# Patient Record
Sex: Female | Born: 1945
Health system: Southern US, Community
[De-identification: ages and names within clinical notes are randomized; demographics above are authoritative.]

## PROBLEM LIST (undated history)

## (undated) DIAGNOSIS — D649 Anemia, unspecified: Secondary | ICD-10-CM

## (undated) DIAGNOSIS — R0602 Shortness of breath: Secondary | ICD-10-CM

## (undated) DIAGNOSIS — K219 Gastro-esophageal reflux disease without esophagitis: Secondary | ICD-10-CM

## (undated) DIAGNOSIS — F329 Major depressive disorder, single episode, unspecified: Secondary | ICD-10-CM

## (undated) DIAGNOSIS — F259 Schizoaffective disorder, unspecified: Secondary | ICD-10-CM

## (undated) DIAGNOSIS — I10 Essential (primary) hypertension: Secondary | ICD-10-CM

## (undated) DIAGNOSIS — J449 Chronic obstructive pulmonary disease, unspecified: Secondary | ICD-10-CM

## (undated) DIAGNOSIS — E785 Hyperlipidemia, unspecified: Secondary | ICD-10-CM

## (undated) DIAGNOSIS — F0390 Unspecified dementia without behavioral disturbance: Secondary | ICD-10-CM

## (undated) DIAGNOSIS — F250 Schizoaffective disorder, bipolar type: Secondary | ICD-10-CM

## (undated) DIAGNOSIS — F32A Depression, unspecified: Secondary | ICD-10-CM

## (undated) HISTORY — DX: Major depressive disorder, single episode, unspecified: F32.9

## (undated) HISTORY — DX: Gastro-esophageal reflux disease without esophagitis: K21.9

## (undated) HISTORY — DX: Hyperlipidemia, unspecified: E78.5

## (undated) HISTORY — DX: Anemia, unspecified: D64.9

## (undated) HISTORY — DX: Depression, unspecified: F32.A

## (undated) HISTORY — DX: Shortness of breath: R06.02

---

## 1973-11-14 HISTORY — PX: VAGINAL HYSTERECTOMY: SUR661

## 1973-11-14 HISTORY — PX: LAPAROSCOPY: SHX197

## 1998-05-14 ENCOUNTER — Inpatient Hospital Stay (HOSPITAL_COMMUNITY): Admission: AD | Admit: 1998-05-14 | Discharge: 1998-05-21 | Payer: Self-pay | Admitting: Specialist

## 2003-09-30 ENCOUNTER — Ambulatory Visit (HOSPITAL_COMMUNITY): Admission: RE | Admit: 2003-09-30 | Discharge: 2003-09-30 | Payer: Self-pay | Admitting: Internal Medicine

## 2004-09-15 ENCOUNTER — Ambulatory Visit: Payer: Self-pay | Admitting: Nurse Practitioner

## 2004-12-14 ENCOUNTER — Ambulatory Visit: Payer: Self-pay | Admitting: Nurse Practitioner

## 2004-12-29 ENCOUNTER — Ambulatory Visit: Payer: Self-pay | Admitting: Nurse Practitioner

## 2005-01-26 ENCOUNTER — Ambulatory Visit: Payer: Self-pay | Admitting: Nurse Practitioner

## 2005-02-24 ENCOUNTER — Ambulatory Visit: Payer: Self-pay | Admitting: Nurse Practitioner

## 2005-03-01 ENCOUNTER — Ambulatory Visit: Payer: Self-pay | Admitting: Nurse Practitioner

## 2005-03-01 ENCOUNTER — Ambulatory Visit (HOSPITAL_COMMUNITY): Admission: RE | Admit: 2005-03-01 | Discharge: 2005-03-01 | Payer: Self-pay | Admitting: Family Medicine

## 2005-03-24 ENCOUNTER — Ambulatory Visit: Payer: Self-pay | Admitting: Nurse Practitioner

## 2005-08-16 ENCOUNTER — Ambulatory Visit: Payer: Self-pay | Admitting: Nurse Practitioner

## 2005-10-05 ENCOUNTER — Ambulatory Visit: Payer: Self-pay | Admitting: Nurse Practitioner

## 2005-12-09 ENCOUNTER — Ambulatory Visit: Payer: Self-pay | Admitting: Family Medicine

## 2006-02-15 ENCOUNTER — Ambulatory Visit (HOSPITAL_COMMUNITY): Admission: RE | Admit: 2006-02-15 | Discharge: 2006-02-15 | Payer: Self-pay | Admitting: Internal Medicine

## 2006-03-14 ENCOUNTER — Encounter: Admission: RE | Admit: 2006-03-14 | Discharge: 2006-03-14 | Payer: Self-pay | Admitting: Family Medicine

## 2006-07-26 ENCOUNTER — Ambulatory Visit: Payer: Self-pay | Admitting: Nurse Practitioner

## 2006-08-16 ENCOUNTER — Ambulatory Visit: Payer: Self-pay | Admitting: Nurse Practitioner

## 2006-12-21 ENCOUNTER — Ambulatory Visit: Payer: Self-pay | Admitting: Nurse Practitioner

## 2007-01-09 ENCOUNTER — Ambulatory Visit: Payer: Self-pay | Admitting: Nurse Practitioner

## 2007-01-09 ENCOUNTER — Other Ambulatory Visit: Admission: RE | Admit: 2007-01-09 | Discharge: 2007-01-09 | Payer: Self-pay | Admitting: Family Medicine

## 2007-01-09 ENCOUNTER — Encounter (INDEPENDENT_AMBULATORY_CARE_PROVIDER_SITE_OTHER): Payer: Self-pay | Admitting: Nurse Practitioner

## 2007-01-09 ENCOUNTER — Encounter (INDEPENDENT_AMBULATORY_CARE_PROVIDER_SITE_OTHER): Payer: Self-pay | Admitting: *Deleted

## 2007-02-19 ENCOUNTER — Ambulatory Visit: Payer: Self-pay | Admitting: Nurse Practitioner

## 2007-03-21 ENCOUNTER — Encounter: Admission: RE | Admit: 2007-03-21 | Discharge: 2007-03-21 | Payer: Self-pay | Admitting: Family Medicine

## 2007-06-04 DIAGNOSIS — K219 Gastro-esophageal reflux disease without esophagitis: Secondary | ICD-10-CM

## 2007-09-18 ENCOUNTER — Ambulatory Visit: Payer: Self-pay | Admitting: Family Medicine

## 2008-01-11 ENCOUNTER — Ambulatory Visit: Payer: Self-pay | Admitting: Family Medicine

## 2008-01-11 ENCOUNTER — Encounter (INDEPENDENT_AMBULATORY_CARE_PROVIDER_SITE_OTHER): Payer: Self-pay | Admitting: Nurse Practitioner

## 2008-01-11 LAB — CONVERTED CEMR LAB

## 2008-03-21 ENCOUNTER — Ambulatory Visit (HOSPITAL_COMMUNITY): Admission: RE | Admit: 2008-03-21 | Discharge: 2008-03-21 | Payer: Self-pay | Admitting: Family Medicine

## 2008-04-01 ENCOUNTER — Encounter: Admission: RE | Admit: 2008-04-01 | Discharge: 2008-04-01 | Payer: Self-pay | Admitting: Family Medicine

## 2008-08-29 ENCOUNTER — Encounter (INDEPENDENT_AMBULATORY_CARE_PROVIDER_SITE_OTHER): Payer: Self-pay | Admitting: Internal Medicine

## 2008-08-29 ENCOUNTER — Ambulatory Visit: Payer: Self-pay | Admitting: Internal Medicine

## 2008-08-29 LAB — CONVERTED CEMR LAB
ALT: 30 units/L (ref 0–35)
AST: 26 units/L (ref 0–37)
Albumin: 4.6 g/dL (ref 3.5–5.2)
Alkaline Phosphatase: 81 units/L (ref 39–117)
BUN: 12 mg/dL (ref 6–23)
Basophils Absolute: 0 10*3/uL (ref 0.0–0.1)
Basophils Relative: 0 % (ref 0–1)
CO2: 22 meq/L (ref 19–32)
Calcium: 9.4 mg/dL (ref 8.4–10.5)
Chloride: 104 meq/L (ref 96–112)
Cholesterol: 189 mg/dL (ref 0–200)
Creatinine, Ser: 0.87 mg/dL (ref 0.40–1.20)
Eosinophils Absolute: 0.1 10*3/uL (ref 0.0–0.7)
Eosinophils Relative: 1 % (ref 0–5)
Glucose, Bld: 105 mg/dL — ABNORMAL HIGH (ref 70–99)
HCT: 40.4 % (ref 36.0–46.0)
HDL: 46 mg/dL (ref >39)
Hemoglobin: 13 g/dL (ref 12.0–15.0)
LDL Cholesterol: 72 mg/dL (ref 0–99)
Lymphocytes Relative: 24 % (ref 12–46)
Lymphs Abs: 1.7 10*3/uL (ref 0.7–4.0)
MCHC: 32.2 g/dL (ref 30.0–36.0)
MCV: 94.4 fL (ref 78.0–100.0)
Monocytes Absolute: 0.6 10*3/uL (ref 0.1–1.0)
Monocytes Relative: 8 % (ref 3–12)
Neutro Abs: 4.8 10*3/uL (ref 1.7–7.7)
Neutrophils Relative %: 67 % (ref 43–77)
Platelets: 257 10*3/uL (ref 150–400)
Potassium: 3.9 meq/L (ref 3.5–5.3)
RBC: 4.28 M/uL (ref 3.87–5.11)
RDW: 15.2 % (ref 11.5–15.5)
Sodium: 141 meq/L (ref 135–145)
TSH: 0.998 microintl units/mL (ref 0.350–4.50)
Total Bilirubin: 0.3 mg/dL (ref 0.3–1.2)
Total CHOL/HDL Ratio: 4.1
Total Protein: 7.1 g/dL (ref 6.0–8.3)
Triglycerides: 354 mg/dL — ABNORMAL HIGH (ref <150)
VLDL: 71 mg/dL — ABNORMAL HIGH (ref 0–40)
WBC: 7.2 10*3/uL (ref 4.0–10.5)

## 2008-10-01 ENCOUNTER — Ambulatory Visit: Payer: Self-pay | Admitting: Internal Medicine

## 2008-12-17 ENCOUNTER — Ambulatory Visit: Payer: Self-pay | Admitting: Internal Medicine

## 2009-01-28 ENCOUNTER — Ambulatory Visit: Payer: Self-pay | Admitting: Family Medicine

## 2009-03-06 ENCOUNTER — Encounter (INDEPENDENT_AMBULATORY_CARE_PROVIDER_SITE_OTHER): Payer: Self-pay | Admitting: *Deleted

## 2009-04-08 ENCOUNTER — Ambulatory Visit: Payer: Self-pay | Admitting: Family Medicine

## 2009-04-08 ENCOUNTER — Encounter (INDEPENDENT_AMBULATORY_CARE_PROVIDER_SITE_OTHER): Payer: Self-pay | Admitting: Internal Medicine

## 2009-04-08 LAB — CONVERTED CEMR LAB
BUN: 16 mg/dL (ref 6–23)
Basophils Absolute: 0 10*3/uL (ref 0.0–0.1)
Basophils Relative: 0 % (ref 0–1)
CO2: 19 meq/L (ref 19–32)
Calcium: 9.6 mg/dL (ref 8.4–10.5)
Chloride: 106 meq/L (ref 96–112)
Cholesterol: 190 mg/dL (ref 0–200)
Creatinine, Ser: 0.85 mg/dL (ref 0.40–1.20)
Eosinophils Absolute: 0.1 10*3/uL (ref 0.0–0.7)
Eosinophils Relative: 1 % (ref 0–5)
Glucose, Bld: 133 mg/dL — ABNORMAL HIGH (ref 70–99)
HCT: 39.4 % (ref 36.0–46.0)
HDL: 51 mg/dL (ref >39)
Hemoglobin: 12.5 g/dL (ref 12.0–15.0)
LDL Cholesterol: 82 mg/dL (ref 0–99)
Lymphocytes Relative: 24 % (ref 12–46)
Lymphs Abs: 1.6 10*3/uL (ref 0.7–4.0)
MCHC: 31.7 g/dL (ref 30.0–36.0)
MCV: 96.1 fL (ref 78.0–100.0)
Monocytes Absolute: 0.6 10*3/uL (ref 0.1–1.0)
Monocytes Relative: 8 % (ref 3–12)
Neutro Abs: 4.5 10*3/uL (ref 1.7–7.7)
Neutrophils Relative %: 66 % (ref 43–77)
Platelets: 258 10*3/uL (ref 150–400)
Potassium: 4 meq/L (ref 3.5–5.3)
RBC: 4.1 M/uL (ref 3.87–5.11)
RDW: 15.3 % (ref 11.5–15.5)
Sodium: 141 meq/L (ref 135–145)
Total CHOL/HDL Ratio: 3.7
Triglycerides: 286 mg/dL — ABNORMAL HIGH (ref <150)
VLDL: 57 mg/dL — ABNORMAL HIGH (ref 0–40)
WBC: 6.8 10*3/uL (ref 4.0–10.5)

## 2009-04-09 ENCOUNTER — Encounter (INDEPENDENT_AMBULATORY_CARE_PROVIDER_SITE_OTHER): Payer: Self-pay | Admitting: Internal Medicine

## 2009-04-09 LAB — CONVERTED CEMR LAB: Hgb A1c MFr Bld: 6.4 % — ABNORMAL HIGH (ref 4.6–6.1)

## 2009-05-07 ENCOUNTER — Ambulatory Visit (HOSPITAL_COMMUNITY): Admission: RE | Admit: 2009-05-07 | Discharge: 2009-05-07 | Payer: Self-pay | Admitting: Internal Medicine

## 2009-05-13 ENCOUNTER — Ambulatory Visit: Payer: Self-pay | Admitting: Family Medicine

## 2009-05-19 ENCOUNTER — Encounter: Admission: RE | Admit: 2009-05-19 | Discharge: 2009-05-19 | Payer: Self-pay | Admitting: Internal Medicine

## 2009-07-02 ENCOUNTER — Ambulatory Visit: Payer: Self-pay | Admitting: Internal Medicine

## 2009-07-02 ENCOUNTER — Encounter (INDEPENDENT_AMBULATORY_CARE_PROVIDER_SITE_OTHER): Payer: Self-pay | Admitting: Internal Medicine

## 2009-07-02 LAB — CONVERTED CEMR LAB
ALT: 32 units/L (ref 0–35)
AST: 24 units/L (ref 0–37)
Albumin: 4.6 g/dL (ref 3.5–5.2)
Alkaline Phosphatase: 46 units/L (ref 39–117)
BUN: 15 mg/dL (ref 6–23)
CO2: 23 meq/L (ref 19–32)
Calcium: 9.7 mg/dL (ref 8.4–10.5)
Chloride: 104 meq/L (ref 96–112)
Cholesterol: 170 mg/dL (ref 0–200)
Creatinine, Ser: 1.01 mg/dL (ref 0.40–1.20)
Glucose, Bld: 103 mg/dL — ABNORMAL HIGH (ref 70–99)
HDL: 55 mg/dL (ref >39)
LDL Cholesterol: 67 mg/dL (ref 0–99)
Potassium: 4.2 meq/L (ref 3.5–5.3)
Sodium: 140 meq/L (ref 135–145)
Total Bilirubin: 0.3 mg/dL (ref 0.3–1.2)
Total CHOL/HDL Ratio: 3.1
Total Protein: 7.1 g/dL (ref 6.0–8.3)
Triglycerides: 240 mg/dL — ABNORMAL HIGH (ref <150)
VLDL: 48 mg/dL — ABNORMAL HIGH (ref 0–40)

## 2009-07-24 ENCOUNTER — Ambulatory Visit: Payer: Self-pay | Admitting: Internal Medicine

## 2009-07-27 ENCOUNTER — Ambulatory Visit: Payer: Self-pay | Admitting: Internal Medicine

## 2009-08-19 ENCOUNTER — Ambulatory Visit: Payer: Self-pay | Admitting: Internal Medicine

## 2009-08-19 ENCOUNTER — Encounter (INDEPENDENT_AMBULATORY_CARE_PROVIDER_SITE_OTHER): Payer: Self-pay | Admitting: Internal Medicine

## 2009-08-19 ENCOUNTER — Other Ambulatory Visit: Admission: RE | Admit: 2009-08-19 | Discharge: 2009-08-19 | Payer: Self-pay | Admitting: Internal Medicine

## 2009-08-19 LAB — CONVERTED CEMR LAB: Microalb, Ur: 0.5 mg/dL (ref 0.00–1.89)

## 2009-08-20 ENCOUNTER — Ambulatory Visit (HOSPITAL_COMMUNITY): Admission: RE | Admit: 2009-08-20 | Discharge: 2009-08-20 | Payer: Self-pay | Admitting: Internal Medicine

## 2009-08-20 ENCOUNTER — Encounter (INDEPENDENT_AMBULATORY_CARE_PROVIDER_SITE_OTHER): Payer: Self-pay | Admitting: Internal Medicine

## 2009-08-25 ENCOUNTER — Ambulatory Visit (HOSPITAL_COMMUNITY): Admission: RE | Admit: 2009-08-25 | Discharge: 2009-08-25 | Payer: Self-pay | Admitting: Internal Medicine

## 2009-09-03 ENCOUNTER — Emergency Department (HOSPITAL_COMMUNITY): Admission: EM | Admit: 2009-09-03 | Discharge: 2009-09-03 | Payer: Self-pay | Admitting: Emergency Medicine

## 2009-11-17 ENCOUNTER — Ambulatory Visit: Payer: Self-pay | Admitting: Internal Medicine

## 2009-12-23 ENCOUNTER — Ambulatory Visit: Payer: Self-pay | Admitting: Internal Medicine

## 2010-02-10 ENCOUNTER — Ambulatory Visit: Payer: Self-pay | Admitting: Internal Medicine

## 2010-02-10 LAB — CONVERTED CEMR LAB
CRP: 0.6 mg/dL — ABNORMAL HIGH (ref <0.6)
Cholesterol: 170 mg/dL (ref 0–200)
HDL: 58 mg/dL (ref >39)
Hgb A1c MFr Bld: 6 % (ref 4.6–6.1)
LDL Cholesterol: 72 mg/dL (ref 0–99)
Total CHOL/HDL Ratio: 2.9
Triglycerides: 198 mg/dL — ABNORMAL HIGH (ref <150)
VLDL: 40 mg/dL (ref 0–40)

## 2010-02-17 ENCOUNTER — Ambulatory Visit: Payer: Self-pay | Admitting: Internal Medicine

## 2010-06-21 ENCOUNTER — Ambulatory Visit (HOSPITAL_COMMUNITY): Admission: RE | Admit: 2010-06-21 | Discharge: 2010-06-21 | Payer: Self-pay | Admitting: Internal Medicine

## 2010-07-07 ENCOUNTER — Ambulatory Visit: Payer: Self-pay | Admitting: Internal Medicine

## 2010-07-07 LAB — CONVERTED CEMR LAB
BUN: 12 mg/dL (ref 6–23)
CO2: 23 meq/L (ref 19–32)
Calcium: 9.6 mg/dL (ref 8.4–10.5)
Chloride: 103 meq/L (ref 96–112)
Creatinine, Ser: 0.83 mg/dL (ref 0.40–1.20)
Glucose, Bld: 113 mg/dL — ABNORMAL HIGH (ref 70–99)
Microalb, Ur: 0.5 mg/dL (ref 0.00–1.89)
Potassium: 4.3 meq/L (ref 3.5–5.3)
Sodium: 143 meq/L (ref 135–145)

## 2011-02-17 LAB — URINALYSIS, ROUTINE W REFLEX MICROSCOPIC
Glucose, UA: 100 mg/dL — AB
Ketones, ur: 15 mg/dL — AB
Nitrite: NEGATIVE
Protein, ur: 100 mg/dL — AB
Specific Gravity, Urine: >1.03 — ABNORMAL HIGH (ref 1.005–1.030)
Urobilinogen, UA: 1 mg/dL (ref 0.0–1.0)
pH: 5 (ref 5.0–8.0)

## 2011-02-17 LAB — URINE MICROSCOPIC-ADD ON

## 2011-04-27 ENCOUNTER — Encounter: Payer: Self-pay | Admitting: Gastroenterology

## 2011-05-12 ENCOUNTER — Ambulatory Visit (AMBULATORY_SURGERY_CENTER): Payer: Medicaid Other | Admitting: *Deleted

## 2011-05-12 VITALS — Ht 63.0 in | Wt 170.0 lb

## 2011-05-12 DIAGNOSIS — Z1211 Encounter for screening for malignant neoplasm of colon: Secondary | ICD-10-CM

## 2011-05-12 MED ORDER — PEG-KCL-NACL-NASULF-NA ASC-C 100 G PO SOLR: ORAL | Status: DC

## 2011-05-13 ENCOUNTER — Encounter: Payer: Self-pay | Admitting: *Deleted

## 2011-05-13 NOTE — Telephone Encounter (Signed)
error 

## 2011-05-13 NOTE — Telephone Encounter (Deleted)
error 

## 2011-05-17 ENCOUNTER — Other Ambulatory Visit (HOSPITAL_COMMUNITY): Payer: Self-pay | Admitting: Family Medicine

## 2011-05-17 DIAGNOSIS — Z1231 Encounter for screening mammogram for malignant neoplasm of breast: Secondary | ICD-10-CM

## 2011-05-20 ENCOUNTER — Telehealth: Payer: Self-pay | Admitting: *Deleted

## 2011-05-20 ENCOUNTER — Encounter: Payer: Self-pay | Admitting: *Deleted

## 2011-05-20 NOTE — Telephone Encounter (Signed)
Message copied by Sarina Ill ANN on Fri May 20, 2011  7:31 AM ------      Message from: Hart Carwin      Created: Thu May 19, 2011  6:24 PM      Regarding: RE: review for need for propofol/Dr. Russella Dar       I have reviewed the limited information there is on this pt and I don't see a strong indication for Propofol.  DB.      ----- Message -----         From: Wyona Almas, RN         Sent: 05/19/2011   9:38 AM           To: Hart Carwin, MD      Subject: review for need for propofol/Dr. Russella Dar                   Ms. Hick's is a diabetic and is on 2 Pych drugs.  I asked Dr. Russella Dar to review her chart re: need for propofol and he said there wasn't enough information in her chart and to get her paper chart and have one of the other doctor's make a decision that he wouldn't be back from vacation until the 9th.  I had [name] check for a paper chart and Ms Connolly doesn't have one.            Also the only Propofol day available was 06/15/11 at 3pm and being a diabetic I felt she needed a morning procedure.  Her procedure is scheduled for 05/26/11 @8 :30am.  Thank you, Wyona Almas

## 2011-05-20 NOTE — Telephone Encounter (Signed)
error 

## 2011-05-20 NOTE — Telephone Encounter (Signed)
Message copied by Sarina Ill ANN on Fri May 20, 2011 10:25 AM ------      Message from: Sarina Ill ANN      Created: Caleen Essex May 20, 2011  7:36 AM      Regarding: FW: Propofol ?                   ----- Message -----         From: Wyona Almas, RN         Sent: 05/12/2011   5:32 PM           To: Meryl Dare, MD,FACG      Subject: Propofol ?                                               Please review Ms. Hick's chart and advise on need for propofol.  She is a diabetic and needs a morning procedure.  She lives in a group home and has to arrange for someone one in charge to come with her.  Thank you. Alvino Chapel

## 2011-05-24 ENCOUNTER — Telehealth: Payer: Self-pay | Admitting: Gastroenterology

## 2011-05-25 ENCOUNTER — Telehealth: Payer: Self-pay | Admitting: *Deleted

## 2011-05-25 NOTE — Telephone Encounter (Signed)
[facility] PREP called to [facility] [phone]

## 2011-05-25 NOTE — Telephone Encounter (Signed)
Previsit nurse called pharmacy. All taken care of.

## 2011-05-26 ENCOUNTER — Encounter: Payer: Self-pay | Admitting: Gastroenterology

## 2011-05-26 ENCOUNTER — Ambulatory Visit (AMBULATORY_SURGERY_CENTER): Payer: Medicare Other | Admitting: Gastroenterology

## 2011-05-26 VITALS — BP 133/71 | HR 94 | Temp 98.7°F | Resp 24 | Ht 64.0 in | Wt 179.0 lb

## 2011-05-26 DIAGNOSIS — Z1211 Encounter for screening for malignant neoplasm of colon: Secondary | ICD-10-CM

## 2011-05-26 DIAGNOSIS — K5730 Diverticulosis of large intestine without perforation or abscess without bleeding: Secondary | ICD-10-CM

## 2011-05-26 LAB — GLUCOSE, CAPILLARY
Glucose-Capillary: 150 mg/dL — ABNORMAL HIGH (ref 70–99)
Glucose-Capillary: 125 mg/dL — ABNORMAL HIGH (ref 70–99)

## 2011-05-26 MED ORDER — SODIUM CHLORIDE 0.9 % IV SOLN: 500.0000 mL | INTRAVENOUS | Status: DC

## 2011-05-26 NOTE — Progress Notes (Signed)
Pt tolerated the colon exam very well. MAW 

## 2011-05-26 NOTE — Patient Instructions (Signed)
Discharge instructions given with verbal understanding. Handouts on diverticulosis and a a high fiber diet given. Resume previous medications.

## 2011-05-27 ENCOUNTER — Telehealth: Payer: Self-pay | Admitting: *Deleted

## 2011-05-27 NOTE — Telephone Encounter (Signed)

## 2011-06-14 ENCOUNTER — Ambulatory Visit: Payer: Medicare Other | Attending: Family Medicine | Admitting: Physical Therapy

## 2011-06-14 DIAGNOSIS — M25559 Pain in unspecified hip: Secondary | ICD-10-CM | POA: Insufficient documentation

## 2011-06-14 DIAGNOSIS — IMO0001 Reserved for inherently not codable concepts without codable children: Secondary | ICD-10-CM | POA: Insufficient documentation

## 2011-06-23 ENCOUNTER — Ambulatory Visit (HOSPITAL_COMMUNITY)
Admission: RE | Admit: 2011-06-23 | Discharge: 2011-06-23 | Disposition: A | Discharge status: Home / Self Care | Payer: Medicaid Other | Source: Ambulatory Visit | Attending: Family Medicine | Admitting: Family Medicine

## 2011-06-23 DIAGNOSIS — Z1231 Encounter for screening mammogram for malignant neoplasm of breast: Secondary | ICD-10-CM | POA: Insufficient documentation

## 2011-06-27 ENCOUNTER — Ambulatory Visit: Payer: Medicare Other | Attending: Family Medicine | Admitting: Physical Therapy

## 2011-06-27 DIAGNOSIS — IMO0001 Reserved for inherently not codable concepts without codable children: Secondary | ICD-10-CM | POA: Insufficient documentation

## 2011-06-27 DIAGNOSIS — M25559 Pain in unspecified hip: Secondary | ICD-10-CM | POA: Insufficient documentation

## 2011-06-30 ENCOUNTER — Ambulatory Visit: Payer: Medicare Other | Admitting: Physical Therapy

## 2011-07-04 ENCOUNTER — Ambulatory Visit: Payer: Medicare Other | Admitting: Physical Therapy

## 2011-07-07 ENCOUNTER — Ambulatory Visit: Payer: Medicare Other | Admitting: Physical Therapy

## 2011-07-14 ENCOUNTER — Ambulatory Visit: Payer: Medicare Other | Admitting: Physical Therapy

## 2011-07-15 ENCOUNTER — Encounter: Payer: Medicare Other | Admitting: Physical Therapy

## 2011-08-28 ENCOUNTER — Emergency Department (HOSPITAL_COMMUNITY)
Admission: EM | Admit: 2011-08-28 | Discharge: 2011-08-28 | Disposition: A | Discharge status: Home / Self Care | Payer: Medicare Other | Attending: Emergency Medicine | Admitting: Emergency Medicine

## 2011-08-28 ENCOUNTER — Emergency Department (HOSPITAL_COMMUNITY): Payer: Medicare Other

## 2011-08-28 DIAGNOSIS — R062 Wheezing: Secondary | ICD-10-CM | POA: Insufficient documentation

## 2011-08-28 DIAGNOSIS — R000 Tachycardia, unspecified: Secondary | ICD-10-CM | POA: Insufficient documentation

## 2011-08-28 DIAGNOSIS — R0602 Shortness of breath: Secondary | ICD-10-CM | POA: Insufficient documentation

## 2011-08-28 DIAGNOSIS — J4489 Other specified chronic obstructive pulmonary disease: Secondary | ICD-10-CM | POA: Insufficient documentation

## 2011-08-28 DIAGNOSIS — J449 Chronic obstructive pulmonary disease, unspecified: Secondary | ICD-10-CM | POA: Insufficient documentation

## 2011-08-28 LAB — DIFFERENTIAL
Basophils Absolute: 0 10*3/uL (ref 0.0–0.1)
Eosinophils Absolute: 0.1 10*3/uL (ref 0.0–0.7)
Eosinophils Relative: 1 % (ref 0–5)
Lymphocytes Relative: 13 % (ref 12–46)
Neutrophils Relative %: 77 % (ref 43–77)

## 2011-08-28 LAB — CBC
Platelets: 288 10*3/uL (ref 150–400)
RDW: 15.5 % (ref 11.5–15.5)
WBC: 9.2 10*3/uL (ref 4.0–10.5)

## 2011-08-28 LAB — POCT I-STAT, CHEM 8
Calcium, Ion: 1.15 mmol/L (ref 1.12–1.32)
Glucose, Bld: 139 mg/dL — ABNORMAL HIGH (ref 70–99)
HCT: 35 % — ABNORMAL LOW (ref 36.0–46.0)
Hemoglobin: 11.9 g/dL — ABNORMAL LOW (ref 12.0–15.0)
Potassium: 3.7 mEq/L (ref 3.5–5.1)

## 2011-08-29 ENCOUNTER — Emergency Department (HOSPITAL_COMMUNITY)
Admission: EM | Admit: 2011-08-29 | Discharge: 2011-08-29 | Disposition: A | Discharge status: Home / Self Care | Payer: Medicare Other | Attending: Emergency Medicine | Admitting: Emergency Medicine

## 2011-08-29 ENCOUNTER — Emergency Department (HOSPITAL_COMMUNITY): Payer: Medicare Other

## 2011-08-29 DIAGNOSIS — R0989 Other specified symptoms and signs involving the circulatory and respiratory systems: Secondary | ICD-10-CM | POA: Insufficient documentation

## 2011-08-29 DIAGNOSIS — R0609 Other forms of dyspnea: Secondary | ICD-10-CM | POA: Insufficient documentation

## 2011-08-29 DIAGNOSIS — R0602 Shortness of breath: Secondary | ICD-10-CM | POA: Insufficient documentation

## 2011-08-29 DIAGNOSIS — R0789 Other chest pain: Secondary | ICD-10-CM | POA: Insufficient documentation

## 2011-08-29 DIAGNOSIS — Z79899 Other long term (current) drug therapy: Secondary | ICD-10-CM | POA: Insufficient documentation

## 2011-08-29 DIAGNOSIS — E119 Type 2 diabetes mellitus without complications: Secondary | ICD-10-CM | POA: Insufficient documentation

## 2011-08-29 DIAGNOSIS — J449 Chronic obstructive pulmonary disease, unspecified: Secondary | ICD-10-CM | POA: Insufficient documentation

## 2011-08-29 DIAGNOSIS — J4489 Other specified chronic obstructive pulmonary disease: Secondary | ICD-10-CM | POA: Insufficient documentation

## 2011-08-29 LAB — POCT I-STAT, CHEM 8
Chloride: 106 mEq/L (ref 96–112)
HCT: 34 % — ABNORMAL LOW (ref 36.0–46.0)
Potassium: 3.3 mEq/L — ABNORMAL LOW (ref 3.5–5.1)

## 2011-09-01 ENCOUNTER — Telehealth: Payer: Self-pay | Admitting: Internal Medicine

## 2011-09-01 MED ORDER — ALBUTEROL SULFATE HFA 108 (90 BASE) MCG/ACT IN AERS: 2.0000 | INHALATION_SPRAY | Freq: Four times a day (QID) | RESPIRATORY_TRACT | Status: DC | PRN

## 2011-09-01 NOTE — Telephone Encounter (Signed)
Pt has appt on 10-25 with MW at 9am for HFU.  Pt stated that she is out of the ventolin, proair and robitussin DM.  She is requesting refill of these meds to last until her ov next year.  Please advise if ok to refill.  thanks

## 2011-09-01 NOTE — Telephone Encounter (Signed)
Given enough proair until ov

## 2011-09-01 NOTE — Telephone Encounter (Signed)
Called and spoke with pt and she is aware of proair that has been sent to her pharmacy for refills. Pt is aware to keep her appt with MW.

## 2011-09-08 ENCOUNTER — Ambulatory Visit (INDEPENDENT_AMBULATORY_CARE_PROVIDER_SITE_OTHER): Payer: Medicare Other | Admitting: Internal Medicine

## 2011-09-08 ENCOUNTER — Encounter: Payer: Self-pay | Admitting: Internal Medicine

## 2011-09-08 VITALS — BP 146/82 | HR 109 | Temp 98.0°F | Ht 63.0 in | Wt 166.8 lb

## 2011-09-08 DIAGNOSIS — K5790 Diverticulosis of intestine, part unspecified, without perforation or abscess without bleeding: Secondary | ICD-10-CM

## 2011-09-08 DIAGNOSIS — J4540 Moderate persistent asthma, uncomplicated: Secondary | ICD-10-CM | POA: Insufficient documentation

## 2011-09-08 DIAGNOSIS — K5730 Diverticulosis of large intestine without perforation or abscess without bleeding: Secondary | ICD-10-CM

## 2011-09-08 DIAGNOSIS — J449 Chronic obstructive pulmonary disease, unspecified: Secondary | ICD-10-CM

## 2011-09-08 DIAGNOSIS — F17200 Nicotine dependence, unspecified, uncomplicated: Secondary | ICD-10-CM

## 2011-09-08 DIAGNOSIS — Z87891 Personal history of nicotine dependence: Secondary | ICD-10-CM | POA: Insufficient documentation

## 2011-09-08 MED ORDER — BUDESONIDE-FORMOTEROL FUMARATE 160-4.5 MCG/ACT IN AERO: INHALATION_SPRAY | RESPIRATORY_TRACT | Status: DC

## 2011-09-08 NOTE — Assessment & Plan Note (Addendum)
Symptoms are markedly disproportionate to objective findings and not clear this is a lung problem but pt does appear to have difficult airway management issues. DDX of  difficult airways managment all start with A and  include Adherence, Ace Inhibitors, Acid Reflux, Active Sinus Disease, Alpha 1 Antitripsin deficiency, Anxiety masquerading as Airways dz,  ABPA,  allergy(esp in young), Aspiration (esp in elderly), Adverse effects of DPI,  Active smokers, plus two Bs  = Bronchiectasis and Beta blocker use..and one C= CHF  Adherence is always the initial "prime suspect" and is a multilayered concern that requires a "trust but verify" approach in every patient - starting with knowing how to use medications, especially inhalers, correctly, keeping up with refills and understanding the fundamental difference between maintenance and prns vs those medications only taken for a very short course and then stopped and not refilled. The proper method of use, as well as anticipated side effects, of this metered-dose inhaler are discussed and demonstrated to the patient. Improved only to 25% despite reported previous response to saba so not clear she really needs it  ? Acid reflux related, note on biphosphonates, low threshold to d/c or change to IV reclast if considered important for bone health  Active smoking discussed separately.  Anxiety dx of exclusion

## 2011-09-08 NOTE — Progress Notes (Signed)
Subjective:     Patient ID: Elizabeth Schwartz, female   DOB: [birth year], 65 y.o.   MRN: [id number]  HPI  18 yowf smoker uses [facility] for [facility] lives in assisted living since 2004 ? Why, she's not sure,  seen in ER for sob and referred to pulmonary clinic 09/08/2011  09/08/2011 Initial pulmonary office eval  Cc sob x sev years indolent onset progressive decline to point where it's difficult to do grocery shopping with first serious exac x one month much more sob s sign cough or dysphagia itching sneezing >to er where got 3 nebs and better at ov c no meds at all x over a week.  Sleeping ok without nocturnal  or early am exacerbation  of respiratory  c/o's or need for noct saba. Also denies any obvious fluctuation of symptoms with weather or environmental changes or other aggravating or alleviating factors except as outlined above   ROS:  At present neg for  any significant sore throat, dysphagia, itching, sneezing,  nasal congestion or excess/ purulent secretions,  fever, chills, sweats, unintended wt loss, pleuritic or exertional cp, hempoptysis, orthopnea pnd or leg swelling.     Past Medical Hx Diabetes Hyperlipidemia Osteoporosis  Psych ? schizoprhenia on phenothiazines       Review of Systems     Objective:   Physical Exam  amb wf unusual affect, resting tremor  wt 166 09/08/2011  HEENT mild turbinate edema.  Oropharynx no thrush or excess pnd or cobblestoning.  No JVD or cervical adenopathy. Mild accessory muscle hypertrophy. Trachea midline, nl thryroid. Chest was hyperinflated by percussion with diminished breath sounds and moderate increased exp time without wheeze. Hoover sign positive at mid inspiration. Regular rate and rhythm without murmur gallop or rub or increase P2 or edema.  Abd: no hsm, nl excursion. Ext warm without cyanosis or clubbing.     08/29/11  No evidence of active pulmonary disease.   Assessment:         Plan:

## 2011-09-08 NOTE — Assessment & Plan Note (Signed)
I took an extended  opportunity with this patient to outline the consequences of continued cigarette use  in airway disorders based on all the data we have from the multiple national lung health studies (perfomed over decades at millions of dollars in cost)  indicating that smoking cessation, not choice of inhalers or physicians, is the most important aspect of care.    I emphasized that although we never turn away smokers from the pulmonary clinic, we do ask that they understand that the recommendations that we make  won't work nearly as well in the presence of continued cigarette exposure.  In fact, we may very well  reach a point where we can't promise to help the patient if he/she can't quit smoking. (We can and will promise to try to help, we just can't promise what we recommend will really work)

## 2011-09-08 NOTE — Patient Instructions (Signed)
Start symbicort 160  Take 2 puffs first thing in am and then another 2 puffs about 12 hours later.    Work on inhaler technique:  relax and gently blow all the way out then take a nice smooth deep breath back in, triggering the inhaler at same time you start breathing in.  Hold for up to 5 seconds if you can.  Rinse and gargle with water when done   If your mouth or throat starts to bother you,   I suggest you time the inhaler to your dental care and after using the inhaler(s) brush teeth and tongue with a baking soda containing toothpaste and when you rinse this out, gargle with it first to see if this helps your mouth and throat.    Please schedule a follow up office visit in 4 weeks, sooner if needed with pfts

## 2011-09-14 ENCOUNTER — Telehealth: Payer: Self-pay | Admitting: Internal Medicine

## 2011-09-14 NOTE — Telephone Encounter (Signed)
Pt aware that sample of Ventolin HFA is at front for pick up.

## 2011-10-11 ENCOUNTER — Encounter: Payer: Self-pay | Admitting: Internal Medicine

## 2011-10-11 ENCOUNTER — Ambulatory Visit (INDEPENDENT_AMBULATORY_CARE_PROVIDER_SITE_OTHER): Payer: Medicare Other | Admitting: Internal Medicine

## 2011-10-11 VITALS — BP 126/78 | HR 104 | Temp 98.1°F | Ht 63.0 in | Wt 168.0 lb

## 2011-10-11 DIAGNOSIS — F17200 Nicotine dependence, unspecified, uncomplicated: Secondary | ICD-10-CM

## 2011-10-11 DIAGNOSIS — J449 Chronic obstructive pulmonary disease, unspecified: Secondary | ICD-10-CM

## 2011-10-11 LAB — PULMONARY FUNCTION TEST

## 2011-10-11 MED ORDER — BUDESONIDE-FORMOTEROL FUMARATE 160-4.5 MCG/ACT IN AERO: INHALATION_SPRAY | RESPIRATORY_TRACT | Status: DC

## 2011-10-11 NOTE — Assessment & Plan Note (Signed)
GOLD 0 with probable asthmatic component and Symptoms   markedly disproportionate to objective findings and not clear this is a lung problem but pt does appear to have difficult airway management issues.   DDX of  difficult airways managment all start with A and  include Adherence, Ace Inhibitors, Acid Reflux, Active Sinus Disease, Alpha 1 Antitripsin deficiency, Anxiety masquerading as Airways dz,  ABPA,  allergy(esp in young), Aspiration (esp in elderly), Adverse effects of DPI,  Active smokers, plus two Bs  = Bronchiectasis and Beta blocker use..and one C= CHF  Active smoking discussed else where  Adherence:    Each maintenance medication was reviewed in detail including most importantly the difference between maintenance and as needed and under what circumstances the prns are to be used.  Please see instructions for details which were reviewed in writing and the patient given a copy.  The proper method of use, as well as anticipated side effects, of this metered-dose inhaler are discussed and demonstrated to the patient. Improved to 50% with coaching  Anxiety usually a dx of exclusion but obviously an issue here

## 2011-10-11 NOTE — Progress Notes (Signed)
PFT done today. 

## 2011-10-11 NOTE — Assessment & Plan Note (Signed)
I reviewed the Flethcher curve with patient that basically indicates  if you quit smoking when your best day FEV1 is still well preserved it is highly unlikely you will progress to severe disease and informed the patient there was no medication on the market that has proven to change the curve or the likelihood of progression.  Therefore stopping smoking and maintaining abstinence is the most important aspect of care, not choice of inhalers or for that matter, doctors.   

## 2011-10-11 NOTE — Progress Notes (Signed)
Subjective:     Patient ID: Elizabeth Schwartz, female   DOB: [birth year], 65 y.o.   MRN: [id number]  HPI  2 yowf smoker uses [facility] lives in assisted living since 2004 ? Why, she's not sure,  seen in ER for sob and referred to pulmonary clinic 09/08/2011  09/08/2011 Initial pulmonary office eval  Cc sob x sev years indolent onset progressive decline to point where it's difficult to do grocery shopping with first serious exac x one month much more sob s sign cough or dysphagia itching sneezing >to er where got 3 nebs and better at ov c no meds at all x over a week. rec Start symbicort 160  Take 2 puffs first thing in am and then another 2 puffs about 12 hours later.  Work on inhaler technique    10/11/2011 f/u ov/ still smoking one or two a day cc breathing better but still  using saba 2-3 daytime only c/o sob flat and mod pace x one half block.  No sign cough now. hfa still very poor.   Sleeping ok without nocturnal  or early am exacerbation  of respiratory  c/o's or need for noct saba. Also denies any obvious fluctuation of symptoms with weather or environmental changes or other aggravating or alleviating factors except as outlined above   ROS  At present neg for  any significant sore throat, dysphagia, itching, sneezing,  nasal congestion or excess/ purulent secretions,  fever, chills, sweats, unintended wt loss, pleuritic or exertional cp, hempoptysis, orthopnea pnd or leg swelling.  Also denies presyncope, palpitations, heartburn, abdominal pain, nausea, vomiting, diarrhea  or change in bowel or urinary habits, dysuria,hematuria,  rash, arthralgias, visual complaints, headache, numbness weakness or ataxia.      Past Medical Hx Diabetes Hyperlipidemia Osteoporosis  Psych ? schizoprhenia on phenothiazines           Objective:  Physical Exam  amb wf unusual affect, resting tremor   wt 166 09/08/2011 > 10/11/2011  168  HEENT mild turbinate edema.   Oropharynx no thrush or excess pnd or cobblestoning.  No JVD or cervical adenopathy. Mild accessory muscle hypertrophy. Trachea midline, nl thryroid. Chest was hyperinflated by percussion with diminished breath sounds and moderate increased exp time without wheeze. Hoover sign positive at mid inspiration. Regular rate and rhythm without murmur gallop or rub or increase P2 or edema.  Abd: no hsm, nl excursion. Ext warm without cyanosis or clubbing.    cxr 08/29/11  No evidence of active pulmonary disease.   Assessment:         Plan:

## 2011-10-11 NOTE — Patient Instructions (Addendum)
I strongly recommend if you need to be treated with fosfamax longterm that you reclast IV yearly instead since this has no adverse effects on the airways.  Stop smoking completely - it's the most important aspect of your care.   Work on inhaler technique:  relax and gently blow all the way out then take a nice smooth deep breath back in, triggering the inhaler at same time you start breathing in.  Hold for up to 5 seconds if you can.  Rinse and gargle with water when done   If your mouth or throat starts to bother you,   I suggest you time the inhaler to your dental care and after using the inhaler(s) brush teeth and tongue with a baking soda containing toothpaste and when you rinse this out, gargle with it first to see if this helps your mouth and throat.     Only use your albuterol as a rescue medication to be used if you can't catch your breath by resting or doing a relaxed purse lip breathing pattern. The less you use it, the better it will work when you need it.    If you are satisfied with your treatment plan let your doctor know and he/she can either refill your medications or you can return here when your prescription runs out.     If in any way you are not 100% satisfied,  please tell us.  If 100% better, tell your friends!

## 2011-11-17 ENCOUNTER — Emergency Department (HOSPITAL_COMMUNITY): Payer: Medicare Other

## 2011-11-17 ENCOUNTER — Encounter (HOSPITAL_COMMUNITY): Payer: Self-pay | Admitting: *Deleted

## 2011-11-17 ENCOUNTER — Other Ambulatory Visit: Payer: Medicare Other

## 2011-11-17 ENCOUNTER — Inpatient Hospital Stay (HOSPITAL_COMMUNITY)
Admission: EM | Admit: 2011-11-17 | Discharge: 2011-11-22 | DRG: 190 | Disposition: A | Discharge status: Nursing Home (Includes ICF) | Payer: Medicare Other | Attending: Family Medicine | Admitting: Family Medicine

## 2011-11-17 DIAGNOSIS — R0602 Shortness of breath: Secondary | ICD-10-CM | POA: Diagnosis not present

## 2011-11-17 DIAGNOSIS — J9819 Other pulmonary collapse: Secondary | ICD-10-CM | POA: Diagnosis not present

## 2011-11-17 DIAGNOSIS — IMO0002 Reserved for concepts with insufficient information to code with codable children: Secondary | ICD-10-CM | POA: Diagnosis not present

## 2011-11-17 DIAGNOSIS — J189 Pneumonia, unspecified organism: Secondary | ICD-10-CM | POA: Diagnosis present

## 2011-11-17 DIAGNOSIS — Z720 Tobacco use: Secondary | ICD-10-CM

## 2011-11-17 DIAGNOSIS — F209 Schizophrenia, unspecified: Secondary | ICD-10-CM | POA: Diagnosis not present

## 2011-11-17 DIAGNOSIS — Z79899 Other long term (current) drug therapy: Secondary | ICD-10-CM | POA: Diagnosis not present

## 2011-11-17 DIAGNOSIS — D649 Anemia, unspecified: Secondary | ICD-10-CM | POA: Diagnosis present

## 2011-11-17 DIAGNOSIS — F17200 Nicotine dependence, unspecified, uncomplicated: Secondary | ICD-10-CM | POA: Diagnosis not present

## 2011-11-17 DIAGNOSIS — Z87891 Personal history of nicotine dependence: Secondary | ICD-10-CM | POA: Diagnosis present

## 2011-11-17 DIAGNOSIS — R5381 Other malaise: Secondary | ICD-10-CM | POA: Diagnosis not present

## 2011-11-17 DIAGNOSIS — R000 Tachycardia, unspecified: Secondary | ICD-10-CM | POA: Diagnosis not present

## 2011-11-17 DIAGNOSIS — M810 Age-related osteoporosis without current pathological fracture: Secondary | ICD-10-CM | POA: Diagnosis present

## 2011-11-17 DIAGNOSIS — J4540 Moderate persistent asthma, uncomplicated: Secondary | ICD-10-CM | POA: Diagnosis present

## 2011-11-17 DIAGNOSIS — J449 Chronic obstructive pulmonary disease, unspecified: Secondary | ICD-10-CM

## 2011-11-17 DIAGNOSIS — J441 Chronic obstructive pulmonary disease with (acute) exacerbation: Secondary | ICD-10-CM | POA: Diagnosis not present

## 2011-11-17 DIAGNOSIS — E876 Hypokalemia: Secondary | ICD-10-CM | POA: Diagnosis not present

## 2011-11-17 DIAGNOSIS — E119 Type 2 diabetes mellitus without complications: Secondary | ICD-10-CM | POA: Diagnosis present

## 2011-11-17 DIAGNOSIS — E785 Hyperlipidemia, unspecified: Secondary | ICD-10-CM | POA: Diagnosis present

## 2011-11-17 DIAGNOSIS — F3289 Other specified depressive episodes: Secondary | ICD-10-CM | POA: Diagnosis present

## 2011-11-17 DIAGNOSIS — F329 Major depressive disorder, single episode, unspecified: Secondary | ICD-10-CM | POA: Diagnosis present

## 2011-11-17 DIAGNOSIS — K219 Gastro-esophageal reflux disease without esophagitis: Secondary | ICD-10-CM | POA: Diagnosis present

## 2011-11-17 DIAGNOSIS — R062 Wheezing: Secondary | ICD-10-CM | POA: Diagnosis not present

## 2011-11-17 DIAGNOSIS — R059 Cough, unspecified: Secondary | ICD-10-CM | POA: Diagnosis not present

## 2011-11-17 DIAGNOSIS — R918 Other nonspecific abnormal finding of lung field: Secondary | ICD-10-CM | POA: Diagnosis not present

## 2011-11-17 DIAGNOSIS — J9600 Acute respiratory failure, unspecified whether with hypoxia or hypercapnia: Secondary | ICD-10-CM | POA: Diagnosis not present

## 2011-11-17 HISTORY — DX: Chronic obstructive pulmonary disease, unspecified: J44.9

## 2011-11-17 LAB — POCT I-STAT 3, ART BLOOD GAS (G3+)
Acid-base deficit: 4 mmol/L — ABNORMAL HIGH (ref 0.0–2.0)
O2 Saturation: 92 %
pCO2 arterial: 38.9 mmHg (ref 35.0–45.0)

## 2011-11-17 LAB — DIFFERENTIAL
Basophils Relative: 0 % (ref 0–1)
Eosinophils Absolute: 0 10*3/uL (ref 0.0–0.7)
Monocytes Absolute: 1.2 10*3/uL — ABNORMAL HIGH (ref 0.1–1.0)
Neutrophils Relative %: 85 % — ABNORMAL HIGH (ref 43–77)

## 2011-11-17 LAB — CBC
Hemoglobin: 10.2 g/dL — ABNORMAL LOW (ref 12.0–15.0)
MCH: 30.8 pg (ref 26.0–34.0)
MCHC: 33.4 g/dL (ref 30.0–36.0)
MCV: 92.7 fL (ref 78.0–100.0)
Platelets: 243 10*3/uL (ref 150–400)
Platelets: 278 10*3/uL (ref 150–400)
RBC: 3.28 MIL/uL — ABNORMAL LOW (ref 3.87–5.11)
RDW: 14.8 % (ref 11.5–15.5)
RDW: 15 % (ref 11.5–15.5)
WBC: 12.4 10*3/uL — ABNORMAL HIGH (ref 4.0–10.5)

## 2011-11-17 LAB — CREATININE, SERUM
GFR calc Af Amer: 73 mL/min — ABNORMAL LOW (ref >90)
GFR calc non Af Amer: 63 mL/min — ABNORMAL LOW (ref >90)

## 2011-11-17 LAB — BASIC METABOLIC PANEL
Calcium: 9.4 mg/dL (ref 8.4–10.5)
GFR calc non Af Amer: 67 mL/min — ABNORMAL LOW (ref >90)
Glucose, Bld: 212 mg/dL — ABNORMAL HIGH (ref 70–99)
Sodium: 132 mEq/L — ABNORMAL LOW (ref 135–145)

## 2011-11-17 LAB — POCT I-STAT TROPONIN I

## 2011-11-17 MED ORDER — DEXTROSE 5 % IV SOLN
1.0000 g | Freq: Once | INTRAVENOUS | Status: AC
  Administered 2011-11-17: 1 g via INTRAVENOUS
  Filled 2011-11-17: qty 10

## 2011-11-17 MED ORDER — IPRATROPIUM BROMIDE 0.02 % IN SOLN
0.5000 mg | Freq: Once | RESPIRATORY_TRACT | Status: AC
  Administered 2011-11-17: 0.5 mg via RESPIRATORY_TRACT
  Filled 2011-11-17: qty 2.5

## 2011-11-17 MED ORDER — SODIUM CHLORIDE 0.9 % IV BOLUS (SEPSIS): 500.0000 mL | Freq: Once | INTRAVENOUS | Status: DC

## 2011-11-17 MED ORDER — ONDANSETRON HCL 4 MG PO TABS: 4.0000 mg | ORAL_TABLET | Freq: Four times a day (QID) | ORAL | Status: DC | PRN

## 2011-11-17 MED ORDER — ALBUTEROL SULFATE HFA 108 (90 BASE) MCG/ACT IN AERS
2.0000 | INHALATION_SPRAY | Freq: Four times a day (QID) | RESPIRATORY_TRACT | Status: DC | PRN
  Filled 2011-11-17: qty 6.7

## 2011-11-17 MED ORDER — SODIUM CHLORIDE 0.9 % IJ SOLN: 3.0000 mL | INTRAMUSCULAR | Status: DC | PRN

## 2011-11-17 MED ORDER — POTASSIUM CHLORIDE 20 MEQ PO PACK
40.0000 meq | PACK | Freq: Once | ORAL | Status: DC
  Filled 2011-11-17 (×2): qty 2

## 2011-11-17 MED ORDER — SODIUM CHLORIDE 0.9 % IJ SOLN
3.0000 mL | Freq: Two times a day (BID) | INTRAMUSCULAR | Status: DC
  Administered 2011-11-18 – 2011-11-21 (×7): 3 mL via INTRAVENOUS

## 2011-11-17 MED ORDER — DONEPEZIL HCL 10 MG PO TABS
10.0000 mg | ORAL_TABLET | Freq: Every day | ORAL | Status: DC
  Administered 2011-11-18 – 2011-11-21 (×5): 10 mg via ORAL
  Filled 2011-11-17 (×6): qty 1

## 2011-11-17 MED ORDER — CALCIUM POLYCARBOPHIL 625 MG PO TABS
625.0000 mg | ORAL_TABLET | Freq: Two times a day (BID) | ORAL | Status: DC
  Administered 2011-11-18: 625 mg via ORAL
  Administered 2011-11-18: 01:00:00 via ORAL
  Administered 2011-11-18 – 2011-11-22 (×8): 625 mg via ORAL
  Filled 2011-11-17 (×12): qty 1

## 2011-11-17 MED ORDER — FIBER 5 GM/15ML PO LIQD: 15.0000 mL | Freq: Two times a day (BID) | ORAL | Status: DC

## 2011-11-17 MED ORDER — THIOTHIXENE 5 MG PO CAPS
5.0000 mg | ORAL_CAPSULE | Freq: Every day | ORAL | Status: DC
  Administered 2011-11-18 – 2011-11-21 (×5): 5 mg via ORAL
  Filled 2011-11-17 (×6): qty 1

## 2011-11-17 MED ORDER — HEPARIN SODIUM (PORCINE) 5000 UNIT/ML IJ SOLN
5000.0000 [IU] | Freq: Three times a day (TID) | INTRAMUSCULAR | Status: DC
  Administered 2011-11-17 – 2011-11-22 (×14): 5000 [IU] via SUBCUTANEOUS
  Filled 2011-11-17 (×17): qty 1

## 2011-11-17 MED ORDER — ALBUTEROL SULFATE (5 MG/ML) 0.5% IN NEBU
5.0000 mg | INHALATION_SOLUTION | Freq: Once | RESPIRATORY_TRACT | Status: AC
  Administered 2011-11-17: 5 mg via RESPIRATORY_TRACT
  Filled 2011-11-17: qty 1

## 2011-11-17 MED ORDER — METFORMIN HCL 500 MG PO TABS
500.0000 mg | ORAL_TABLET | Freq: Two times a day (BID) | ORAL | Status: DC
  Administered 2011-11-17 – 2011-11-18 (×3): 500 mg via ORAL
  Filled 2011-11-17 (×6): qty 1

## 2011-11-17 MED ORDER — ALBUTEROL SULFATE (5 MG/ML) 0.5% IN NEBU
5.0000 mg | INHALATION_SOLUTION | Freq: Once | RESPIRATORY_TRACT | Status: AC
  Administered 2011-11-17: 5 mg via RESPIRATORY_TRACT
  Filled 2011-11-17: qty 0.5

## 2011-11-17 MED ORDER — FENOFIBRATE 160 MG PO TABS
160.0000 mg | ORAL_TABLET | Freq: Every day | ORAL | Status: DC
  Administered 2011-11-18 – 2011-11-22 (×6): 160 mg via ORAL
  Filled 2011-11-17 (×5): qty 1

## 2011-11-17 MED ORDER — ALBUTEROL SULFATE (5 MG/ML) 0.5% IN NEBU
2.5000 mg | INHALATION_SOLUTION | RESPIRATORY_TRACT | Status: DC | PRN
  Administered 2011-11-18 (×2): 2.5 mg via RESPIRATORY_TRACT
  Filled 2011-11-17: qty 0.5

## 2011-11-17 MED ORDER — BUDESONIDE-FORMOTEROL FUMARATE 160-4.5 MCG/ACT IN AERO
2.0000 | INHALATION_SPRAY | Freq: Two times a day (BID) | RESPIRATORY_TRACT | Status: DC
  Administered 2011-11-18 – 2011-11-22 (×9): 2 via RESPIRATORY_TRACT
  Filled 2011-11-17 (×2): qty 6

## 2011-11-17 MED ORDER — SODIUM CHLORIDE 0.9 % IV SOLN: 250.0000 mL | INTRAVENOUS | Status: DC | PRN

## 2011-11-17 MED ORDER — ACETAMINOPHEN 650 MG RE SUPP: 650.0000 mg | Freq: Four times a day (QID) | RECTAL | Status: DC | PRN

## 2011-11-17 MED ORDER — PREDNISONE 50 MG PO TABS
50.0000 mg | ORAL_TABLET | Freq: Every day | ORAL | Status: DC
  Administered 2011-11-18: 50 mg via ORAL
  Filled 2011-11-17 (×2): qty 1

## 2011-11-17 MED ORDER — AZITHROMYCIN 500 MG PO TABS
500.0000 mg | ORAL_TABLET | Freq: Every day | ORAL | Status: AC
  Administered 2011-11-18 – 2011-11-22 (×5): 500 mg via ORAL
  Filled 2011-11-17 (×5): qty 1

## 2011-11-17 MED ORDER — CHOLECALCIFEROL 10 MCG (400 UNIT) PO TABS
600.0000 [IU] | ORAL_TABLET | Freq: Every day | ORAL | Status: DC
  Administered 2011-11-17 – 2011-11-22 (×6): 600 [IU] via ORAL
  Filled 2011-11-17 (×7): qty 2

## 2011-11-17 MED ORDER — ACETAMINOPHEN 325 MG PO TABS: 650.0000 mg | ORAL_TABLET | Freq: Four times a day (QID) | ORAL | Status: DC | PRN

## 2011-11-17 MED ORDER — IOHEXOL 300 MG/ML  SOLN
100.0000 mL | Freq: Once | INTRAMUSCULAR | Status: AC | PRN
  Administered 2011-11-17: 100 mL via INTRAVENOUS

## 2011-11-17 MED ORDER — CALCIUM CARBONATE 1250 (500 CA) MG PO TABS
1250.0000 mg | ORAL_TABLET | Freq: Two times a day (BID) | ORAL | Status: DC
  Administered 2011-11-17 – 2011-11-22 (×11): 1250 mg via ORAL
  Filled 2011-11-17 (×12): qty 1

## 2011-11-17 MED ORDER — CLOZAPINE 100 MG PO TABS
200.0000 mg | ORAL_TABLET | Freq: Every day | ORAL | Status: DC
  Administered 2011-11-18 – 2011-11-21 (×4): 200 mg via ORAL
  Filled 2011-11-17 (×8): qty 2

## 2011-11-17 MED ORDER — POTASSIUM CHLORIDE CRYS ER 20 MEQ PO TBCR
40.0000 meq | EXTENDED_RELEASE_TABLET | Freq: Once | ORAL | Status: AC
  Administered 2011-11-17: 40 meq via ORAL
  Filled 2011-11-17: qty 2

## 2011-11-17 MED ORDER — CLOZAPINE 100 MG PO TABS
100.0000 mg | ORAL_TABLET | Freq: Two times a day (BID) | ORAL | Status: DC
  Administered 2011-11-17 – 2011-11-22 (×11): 100 mg via ORAL
  Filled 2011-11-17 (×12): qty 1

## 2011-11-17 MED ORDER — SIMVASTATIN 40 MG PO TABS
40.0000 mg | ORAL_TABLET | Freq: Every day | ORAL | Status: DC
  Administered 2011-11-17 – 2011-11-22 (×6): 40 mg via ORAL
  Filled 2011-11-17 (×6): qty 1

## 2011-11-17 MED ORDER — ONDANSETRON HCL 4 MG/2ML IJ SOLN: 4.0000 mg | Freq: Four times a day (QID) | INTRAMUSCULAR | Status: DC | PRN

## 2011-11-17 NOTE — H&P (Signed)
Elizabeth Schwartz is an 66 y.o. female.   Chief Complaint: Shortness of Breath HPI: 66 y.o. F smoker presents with shortness of breath.  Patient reports that she was feeling well until approximately midnight when she woke up with an increasing cough. She reports that yesterday she was in her normal state of health with the exception of a mild, dry cough. Around midnight, she started waking up recurrently with a cough that she describes as "wet". Cough has been nonproductive. She has not noticed any rhinorrhea, runny nose, fevers, or chills. She denies any GI symptoms. She has not had any nausea, vomiting, or diarrhea. She's not had any posttussive emesis. She cannot think of any sick contacts. Of note, she does live in a group home for mental health issues, but has not noted any sick contacts around her.  She has a known history of COPD. She uses tobacco but has cut down from one pack per day to 2 cigarettes per day. She has been eating and drinking well, although has had minimal intake today secondary to shortness of breath with coughing. She states she uses her Symbicort 3 times per day and has not needed her albuterol for the past 2-3 weeks. She reports that she had a similar exacerbation approximately 2-3 weeks ago, which did not require hospitalization, at which time she was started on the Symbicort and albuterol. She has not had antibiotics or steroids in the past few weeks that she knows of. She does not have a home oxygen requirement. She is able to ambulate on her own without significant shortness of breath when she is not sick.  She does report generalized weakness which also started around midnight this morning. She reports feeling shaky after having one or 2 albuterol treatments.  Past Medical History  Diagnosis Date  . Hyperlipidemia   . Diabetes mellitus   . Anemia   . SOB (shortness of breath) on exertion   . GERD (gastroesophageal reflux disease)   . Osteoporosis   . Depression   . COPD  (chronic obstructive pulmonary disease)     Past Surgical History  Procedure Date  . Vaginal hysterectomy   . Laparoscopy     ABD pain    History reviewed. No pertinent family history. Social History:  reports that she has been smoking Cigarettes.  She has been smoking about .25 packs per day. She has never used smokeless tobacco. She reports that she does not drink alcohol or use illicit drugs. she lives in a group home for mental health issues (major depression).  Allergies: No Known Allergies  Medications Prior to Admission  Medication Dose Route Frequency Provider Last Rate Last Dose  . 0.9 %  sodium chloride infusion  500 mL Intravenous Continuous Eliezer Bottom., MD,FACG      . albuterol (PROVENTIL) (5 MG/ML) 0.5% nebulizer solution 5 mg  5 mg Nebulization Once [employer], PA   5 mg at 11/17/11 1153  . albuterol (PROVENTIL) (5 MG/ML) 0.5% nebulizer solution 5 mg  5 mg Nebulization Once [employer], PA   5 mg at 11/17/11 1531  . cefTRIAXone (ROCEPHIN) 1 g in dextrose 5 % 50 mL IVPB  1 g Intravenous Once [employer], PA   1 g at 11/17/11 1529  . iohexol (OMNIPAQUE) 300 MG/ML solution 100 mL  100 mL Intravenous Once PRN Medication Radiologist   100 mL at 11/17/11 1450  . ipratropium (ATROVENT) nebulizer solution 0.5 mg  0.5 mg Nebulization Once [employer]  J Hunt, PA   0.5 mg at 11/17/11 1153  . ipratropium (ATROVENT) nebulizer solution 0.5 mg  0.5 mg Nebulization Once Jenness Corner, PA   0.5 mg at 11/17/11 1531  . sodium chloride 0.9 % bolus 500 mL  500 mL Intravenous Once Jenness Corner, PA       Medications Prior to Admission  Medication Sig Dispense Refill  . albuterol (PROVENTIL HFA;VENTOLIN HFA) 108 (90 BASE) MCG/ACT inhaler Inhale 2 puffs into the lungs every 6 (six) hours as needed for wheezing.  1 Inhaler  0  . albuterol (PROVENTIL) (2.5 MG/3ML) 0.083% nebulizer solution Take 2.5 mg by nebulization every 6 (six) hours as needed. Shortness of breath      .  alendronate (FOSAMAX) 35 MG tablet Take 35 mg by mouth every 7 (seven) days. Take with a full glass of water on an empty stomach.       . budesonide-formoterol (SYMBICORT) 160-4.5 MCG/ACT inhaler Take 2 puffs first thing in am and then another 2 puffs about 12 hours later.     1 Inhaler  12  . calcium carbonate (OS-CAL) 600 MG TABS Take 600 mg by mouth 2 (two) times daily with a meal.        . cloZAPine (CLOZARIL) 100 MG tablet Take 100 mg by mouth. 1 in the morning, 1 in the afternoon, and 2 at night      . donepezil (ARICEPT) 10 MG tablet Take 10 mg by mouth at bedtime.        . fenofibrate 160 MG tablet Take 160 mg by mouth daily.        . Fiber 5 GM/15ML LIQD Take 15 mLs by mouth 2 (two) times daily.        . metFORMIN (GLUCOPHAGE) 500 MG tablet Take 500 mg by mouth 2 (two) times daily with a meal.        . simvastatin (ZOCOR) 40 MG tablet Take 40 mg by mouth daily.       Marland Kitchen thiothixene (NAVANE) 5 MG capsule Take 5 mg by mouth at bedtime.        Marland Kitchen VITAMIN D, CHOLECALCIFEROL, PO Take 600 Units by mouth daily.          Results for orders placed during the hospital encounter of 11/17/11 (from the past 48 hour(s))  CBC     Status: Abnormal   Collection Time   11/17/11 10:35 AM      Component Value Range Comment   WBC 13.8 (*) 4.0 - 10.5 (K/uL)    RBC 3.31 (*) 3.87 - 5.11 (MIL/uL)    Hemoglobin 10.2 (*) 12.0 - 15.0 (g/dL)    HCT 16.1 (*) 09.6 - 46.0 (%)    MCV 92.1  78.0 - 100.0 (fL)    MCH 30.8  26.0 - 34.0 (pg)    MCHC 33.4  30.0 - 36.0 (g/dL)    RDW 04.5  40.9 - 81.1 (%)    Platelets 243  150 - 400 (K/uL)   DIFFERENTIAL     Status: Abnormal   Collection Time   11/17/11 10:35 AM      Component Value Range Comment   Neutrophils Relative 85 (*) 43 - 77 (%)    Lymphocytes Relative 6 (*) 12 - 46 (%)    Monocytes Relative 9  3 - 12 (%)    Eosinophils Relative 0  0 - 5 (%)    Basophils Relative 0  0 - 1 (%)    Neutro  Abs 11.8 (*) 1.7 - 7.7 (K/uL)    Lymphs Abs 0.8  0.7 - 4.0 (K/uL)     Monocytes Absolute 1.2 (*) 0.1 - 1.0 (K/uL)    Eosinophils Absolute 0.0  0.0 - 0.7 (K/uL)    Basophils Absolute 0.0  0.0 - 0.1 (K/uL)    Smear Review MORPHOLOGY UNREMARKABLE     BASIC METABOLIC PANEL     Status: Abnormal   Collection Time   11/17/11 10:35 AM      Component Value Range Comment   Sodium 132 (*) 135 - 145 (mEq/L)    Potassium 3.4 (*) 3.5 - 5.1 (mEq/L)    Chloride 98  96 - 112 (mEq/L)    CO2 20  19 - 32 (mEq/L)    Glucose, Bld 212 (*) 70 - 99 (mg/dL)    BUN 19  6 - 23 (mg/dL)    Creatinine, Ser 1.61  0.50 - 1.10 (mg/dL)    Calcium 9.4  8.4 - 10.5 (mg/dL)    GFR calc non Af Amer 67 (*) >90 (mL/min)    GFR calc Af Amer 77 (*) >90 (mL/min)   POCT I-STAT 3, BLOOD GAS (G3+)     Status: Abnormal   Collection Time   11/17/11 10:54 AM      Component Value Range Comment   pH, Arterial 7.341 (*) 7.350 - 7.400     pCO2 arterial 38.9  35.0 - 45.0 (mmHg)    pO2, Arterial 68.0 (*) 80.0 - 100.0 (mmHg)    Bicarbonate 21.1  20.0 - 24.0 (mEq/L)    TCO2 22  0 - 100 (mmol/L)    O2 Saturation 92.0      Acid-base deficit 4.0 (*) 0.0 - 2.0 (mmol/L)    Patient temperature 98.8 F      Collection site RADIAL, ALLEN'S TEST ACCEPTABLE      Drawn by Operator      Sample type ARTERIAL     POCT I-STAT TROPONIN I     Status: Normal   Collection Time   11/17/11 10:58 AM      Component Value Range Comment   Troponin i, poc 0.03  0.00 - 0.08 (ng/mL)    Comment 3             Ct Angio Chest W/cm &/or Wo Cm  11/17/2011  *RADIOLOGY REPORT*  Clinical Data:  Shortness of breath, cough and tachycardia.  CT ANGIOGRAPHY CHEST WITH CONTRAST  Technique:  Multidetector CT imaging of the chest was performed using the standard protocol during bolus administration of intravenous contrast.  Multiplanar CT image reconstructions including MIPs were obtained to evaluate the vascular anatomy.  Contrast: OMNIPAQUE IOHEXOL 300 MG/ML IV SOLN  Comparison:  Chest x-ray earlier today.  Findings:  Pulmonary arteries are  adequately opacified.  There is no evidence of acute pulmonary embolism.  Mild respiratory motion was present during acquisition.  Patchy airspace opacity in the anterior right upper lobe present which may represent acute pneumonia.  No overt edema or pleural effusions.  Trace pericardial fluid.  Heart size is normal.  No nodules, masses or enlarged lymph nodes are identified.  The airway shows normal patency.  The thoracic aorta is of normal caliber.  The bony thorax is unremarkable.  Review of the MIP images confirms the above findings.  IMPRESSION: Anterior right upper lobe infiltrate.  Original Report Authenticated By: Reola Calkins, M.D.   Dg Chest Port 1 View  11/17/2011  *RADIOLOGY REPORT*  Clinical Data: Weakness,  productive cough  PORTABLE CHEST - 1 VIEW  Comparison: 08/29/2011  Findings: Borderline cardiomegaly noted.  No acute infiltrate or convincing pulmonary edema.  Mild basilar atelectasis.  Central mild bronchitic changes.  IMPRESSION: No acute infiltrate or convincing pulmonary edema.  Mild basilar atelectasis.  Central mild bronchitic changes.  Original Report Authenticated By: Natasha Mead, M.D.    Review of Systems  Constitutional: Negative for fever, chills, weight loss and diaphoresis.  HENT: Negative for congestion and sore throat.   Respiratory: Positive for cough, shortness of breath and wheezing. Negative for hemoptysis and sputum production.   Cardiovascular: Negative for chest pain, palpitations, orthopnea and leg swelling.  Gastrointestinal: Negative for heartburn, nausea, vomiting, abdominal pain, diarrhea, constipation and blood in stool.  Genitourinary: Negative for dysuria and urgency.  Musculoskeletal: Negative for myalgias and joint pain.  Skin: Negative for rash.  Neurological: Positive for weakness. Negative for dizziness, speech change, focal weakness and headaches.  Psychiatric/Behavioral: Positive for depression. Negative for suicidal ideas and substance abuse.     Blood pressure 161/61, pulse 111, temperature 98.8 F (37.1 C), temperature source Oral, resp. rate 20, SpO2 93.00%. Physical Exam  Constitutional: She is oriented to person, place, and time. She appears well-developed and well-nourished. No distress.  HENT:  Head: Normocephalic and atraumatic.  Right Ear: External ear normal.  Left Ear: External ear normal.  Mouth/Throat: Oropharynx is clear and moist. No oropharyngeal exudate.  Eyes: Conjunctivae and EOM are normal. Pupils are equal, round, and reactive to light.  Neck: Normal range of motion. Neck supple. No JVD present. No thyromegaly present.  Cardiovascular: Normal rate, regular rhythm and normal heart sounds.  Exam reveals no friction rub.   No murmur heard. Respiratory: No respiratory distress. She has wheezes. She has rales. She exhibits no tenderness.  GI: Soft. Bowel sounds are normal. There is no tenderness.  Musculoskeletal: She exhibits no edema and no tenderness.  Lymphadenopathy:    She has no cervical adenopathy.  Neurological: She is alert and oriented to person, place, and time. No cranial nerve deficit.  Skin: Skin is warm and dry. No rash noted. She is not diaphoretic. No erythema.  Psychiatric: She has a normal mood and affect.     Assessment/Plan 66 year old female presents with shortness of breath  1. Shortness of breath: Patient with known COPD; likely secondary to COPD exacerbation. Cardiac cause less likely given normal troponin and EKG in the ED, PE ruled out with normal CT angiogram, infectious source less likely with patient being afebrile  -Chest x-ray negative but possible infiltrate seen on CT angiogram; patient has been afebrile so pneumonia is less likely -Patient maintaining oxygen saturation on 2 L nasal cannula; ABG acceptable at this time -Continue supplemental oxygen as needed -Continue albuterol nebulizer every 4 hours as needed -Restart home Symbicort in the morning -Patient given  ceftriaxone x1 in the emergency department, consider 5 day course of Azithro or other antibiotic if patient becomes febrile or does not improve overnight for possible HCAP since pt does have elevated WBC; recheck CBC in AM -Will give 5 day burst of prednisone for COPD exacerbation (will likely cause WBC to rise further) -Smoking Cessation Counseling  2. Tobacco abuse: Patient reports smoking only 2 cigarettes per day and currently does not want nicotine patch. -Smoking cessation counseling -Consider adding low dose nicotine replacement if pt desires  3. Diabetes: Patient reports being only on metformin at home. -Place patient on home medication, Cr WNL, will recheck BMET in AM since pt  was given contrast for CT angio -Consider adding sliding scale insulin if needed.  4. Depression/Psych: Continue home meds (clozapin, aricept, navane). Currently denies any suicidal ideation or psychotic features.  5. HLD: Continue home simvastatin and fenofibrate   6. Anemia: Patient with history of anemia. Hemoglobin currently 10.2. -Recheck CBC in morning -Is only mild at this time and unlikely contributing to her shortness of breath  7. Osteoporosis: Continue home calcium and vitamin D  8. FEN/GI: Heart healthy, carb modified diet; SLIV (pt does not appear dehydrated) -Mild hypoK, will treat with po K replacement in the setting of continued albuterol administration -Recheck BMET in AM  9. Ppx: SQ heparin  10. Dispo: Back to group home pending clinical improvement, decreased O2 requirement and improvement in pulmonary exam  [facility] PGY-2 11/17/2011, 3:46 PM

## 2011-11-17 NOTE — ED Provider Notes (Signed)
Medical screening examination/treatment/procedure(s) were performed by non-physician practitioner and as supervising physician I was immediately available for consultation/collaboration.    Celene Kras, MD 11/17/11 2043

## 2011-11-17 NOTE — ED Provider Notes (Signed)
History     CSN: [id number]  Arrival date & time 11/17/11  1028   First MD Initiated Contact with Patient 11/17/11 1030      Chief Complaint  Patient presents with  . Shortness of Breath    (Consider location/radiation/quality/duration/timing/severity/associated sxs/prior treatment) HPI  Patient is followed by Dr. Sherene Sires, from [facility] pulmonology, for tobacco abuse and shortness of breath with questionable asthma who continues to smoke despite being advised numerous times to quit for optimum symptomatic improvement presents to emergency department by EMS with complaint of shortness of breath. Patient states she began to have a dry cough yesterday but denies fevers or chills and woke this morning acutely short of breath. Per EMS patient was tachypneic and tripoding and therefore EMS started on a dual nebulizer treatment of albuterol and Atrovent and gave 125 mg Solu-Medrol in route. Patient was then given an additional albuterol neb treatment as she was brought in to ER by EMS. Patient denies chest pain, fevers or chills. She denies any recent hospitalizations or antibiotic use. Patient states her cough has been productive of thick green mucus. Patient states she is using her daily Symbicort albuterol inhalers.  Past Medical History  Diagnosis Date  . Hyperlipidemia   . Diabetes mellitus   . Anemia   . SOB (shortness of breath) on exertion   . GERD (gastroesophageal reflux disease)   . Osteoporosis   . Depression     Past Surgical History  Procedure Date  . Vaginal hysterectomy   . Laparoscopy     ABD pain    No family history on file.  History  Substance Use Topics  . Smoking status: Current Everyday Smoker -- 0.2 packs/day    Types: Cigarettes  . Smokeless tobacco: Never Used   Comment: 1-2 ciggs per day  . Alcohol Use: No    OB History    Grav Para Term Preterm Abortions TAB SAB Ect Mult Living                  Review of Systems  All other systems reviewed and  are negative.    Allergies  Review of patient's allergies indicates no known allergies.  Home Medications   Current Outpatient Rx  Name Route Sig Dispense Refill  . ALBUTEROL SULFATE HFA 108 (90 BASE) MCG/ACT IN AERS Inhalation Inhale 2 puffs into the lungs every 6 (six) hours as needed for wheezing. 1 Inhaler 0  . ALBUTEROL SULFATE (2.5 MG/3ML) 0.083% IN NEBU Nebulization Take 2.5 mg by nebulization every 6 (six) hours as needed.      . ALENDRONATE SODIUM 35 MG PO TABS Oral Take 35 mg by mouth every 7 (seven) days. Take with a full glass of water on an empty stomach.     . BUDESONIDE-FORMOTEROL FUMARATE 160-4.5 MCG/ACT IN AERO  Take 2 puffs first thing in am and then another 2 puffs about 12 hours later.    1 Inhaler 12  . CALCIUM CARBONATE 600 MG PO TABS Oral Take 600 mg by mouth 2 (two) times daily with a meal.      . CLOZAPINE 100 MG PO TABS Oral Take 100 mg by mouth. 1 in the morning, 1 in the afternoon, and 2 at night    . DONEPEZIL HCL 10 MG PO TABS Oral Take 10 mg by mouth at bedtime.      . FENOFIBRATE 160 MG PO TABS Oral Take 160 mg by mouth daily.      Marland Kitchen FIBER 5 GM/15ML  PO LIQD Oral Take 15 mLs by mouth 2 (two) times daily.      Marland Kitchen MILK OF MAGNESIA PO Oral Take 30 mLs by mouth 3 (three) times daily as needed. constipation     . METFORMIN HCL 500 MG PO TABS Oral Take 500 mg by mouth 2 (two) times daily with a meal.      . OMEPRAZOLE 40 MG PO CPDR Oral Take 40 mg by mouth daily.      Marland Kitchen SIMVASTATIN 40 MG PO TABS Oral Take 40 mg by mouth at bedtime.      . THIOTHIXENE 5 MG PO CAPS Oral Take 5 mg by mouth at bedtime.      Marland Kitchen VITAMIN D (CHOLECALCIFEROL) PO Oral Take 600 Units by mouth daily.        BP 156/58  Pulse 129  Temp(Src) 98.8 F (37.1 C) (Oral)  Resp 22  SpO2 99%  Physical Exam  Nursing note and vitals reviewed. Constitutional: She is oriented to person, place, and time. She appears well-developed and well-nourished. No distress.  HENT:  Head: Normocephalic and  atraumatic.  Eyes: Conjunctivae and EOM are normal. Pupils are equal, round, and reactive to light.  Neck: Normal range of motion. Neck supple.  Cardiovascular: Regular rhythm, normal heart sounds, intact distal pulses and normal pulses.  Tachycardia present.  Exam reveals no gallop and no friction rub.   No murmur heard. Pulmonary/Chest: Tachypnea noted. No respiratory distress. She has wheezes. She has rhonchi in the right middle field and the left middle field. She has no rales. She exhibits no tenderness.  Abdominal: Bowel sounds are normal. She exhibits no distension and no mass. There is no tenderness. There is no rebound and no guarding.  Musculoskeletal: Normal range of motion. She exhibits no edema and no tenderness.  Neurological: She is alert and oriented to person, place, and time.  Skin: Skin is warm and dry. No rash noted. She is not diaphoretic. No erythema.  Psychiatric: She has a normal mood and affect.    ED Course  Procedures (including critical care time)   Date: 11/17/2011  Rate: 129  Rhythm: sinus tachycardia  QRS Axis: normal  Intervals: normal  ST/T Wave abnormalities: nonspecific T wave changes  Conduction Disutrbances:none  Narrative Interpretation:   Old EKG Reviewed: new tachy but no significant findings compared to Jan 10, 2010    Labs Reviewed  CBC - Abnormal; Notable for the following:    WBC 13.8 (*)    RBC 3.31 (*)    Hemoglobin 10.2 (*)    HCT 30.5 (*)    All other components within normal limits  DIFFERENTIAL - Abnormal; Notable for the following:    Neutrophils Relative 85 (*)    Lymphocytes Relative 6 (*)    Neutro Abs 11.8 (*)    Monocytes Absolute 1.2 (*)    All other components within normal limits  BASIC METABOLIC PANEL - Abnormal; Notable for the following:    Sodium 132 (*)    Potassium 3.4 (*)    Glucose, Bld 212 (*)    GFR calc non Af Amer 67 (*)    GFR calc Af Amer 77 (*)    All other components within normal limits  POCT  I-STAT 3, BLOOD GAS (G3+) - Abnormal; Notable for the following:    pH, Arterial 7.341 (*)    pO2, Arterial 68.0 (*)    Acid-base deficit 4.0 (*)    All other components within normal limits  POCT  I-STAT TROPONIN I  I-STAT TROPONIN I  BLOOD GAS, ARTERIAL   Ct Angio Chest W/cm &/or Wo Cm  11/17/2011  *RADIOLOGY REPORT*  Clinical Data:  Shortness of breath, cough and tachycardia.  CT ANGIOGRAPHY CHEST WITH CONTRAST  Technique:  Multidetector CT imaging of the chest was performed using the standard protocol during bolus administration of intravenous contrast.  Multiplanar CT image reconstructions including MIPs were obtained to evaluate the vascular anatomy.  Contrast: OMNIPAQUE IOHEXOL 300 MG/ML IV SOLN  Comparison:  Chest x-ray earlier today.  Findings:  Pulmonary arteries are adequately opacified.  There is no evidence of acute pulmonary embolism.  Mild respiratory motion was present during acquisition.  Patchy airspace opacity in the anterior right upper lobe present which may represent acute pneumonia.  No overt edema or pleural effusions.  Trace pericardial fluid.  Heart size is normal.  No nodules, masses or enlarged lymph nodes are identified.  The airway shows normal patency.  The thoracic aorta is of normal caliber.  The bony thorax is unremarkable.  Review of the MIP images confirms the above findings.  IMPRESSION: Anterior right upper lobe infiltrate.  Original Report Authenticated By: Reola Calkins, M.D.   Dg Chest Port 1 View  11/17/2011  *RADIOLOGY REPORT*  Clinical Data: Weakness, productive cough  PORTABLE CHEST - 1 VIEW  Comparison: 08/29/2011  Findings: Borderline cardiomegaly noted.  No acute infiltrate or convincing pulmonary edema.  Mild basilar atelectasis.  Central mild bronchitic changes.  IMPRESSION: No acute infiltrate or convincing pulmonary edema.  Mild basilar atelectasis.  Central mild bronchitic changes.  Original Report Authenticated By: Natasha Mead, M.D.     1.  COPD exacerbation   2. Tobacco abuse   3. Community acquired pneumonia       MDM  [facility] to admit patient. IV abx given for CAP delayed due to CXR showing no PNA but seen on CT angio. No evidence of PE. VSS but remains tachy.        Jenness Corner, PA 11/17/11 1542  Jenness Corner, [place] 11/17/11 1544

## 2011-11-17 NOTE — ED Notes (Signed)
Patient  C/o sob onset this am, patient has a home health nurse who saw her earlier today  And came back to her house patient appear to be in resp. Distress. Patient was given 2 puffs on her inhaler without relief. Patient was given Albuterol 5 mg/ atrovent .5 , patient was also given 125 mg of solumedrol.

## 2011-11-18 LAB — BASIC METABOLIC PANEL
BUN: 23 mg/dL (ref 6–23)
Chloride: 101 mEq/L (ref 96–112)
GFR calc Af Amer: 60 mL/min — ABNORMAL LOW (ref >90)
GFR calc non Af Amer: 52 mL/min — ABNORMAL LOW (ref >90)
Glucose, Bld: 164 mg/dL — ABNORMAL HIGH (ref 70–99)
Potassium: 4.2 mEq/L (ref 3.5–5.1)
Sodium: 136 mEq/L (ref 135–145)

## 2011-11-18 LAB — CBC
HCT: 31 % — ABNORMAL LOW (ref 36.0–46.0)
Hemoglobin: 10 g/dL — ABNORMAL LOW (ref 12.0–15.0)
MCHC: 32.3 g/dL (ref 30.0–36.0)
RDW: 14.9 % (ref 11.5–15.5)
WBC: 15.5 10*3/uL — ABNORMAL HIGH (ref 4.0–10.5)

## 2011-11-18 MED ORDER — ALBUTEROL SULFATE (5 MG/ML) 0.5% IN NEBU
5.0000 mg | INHALATION_SOLUTION | RESPIRATORY_TRACT | Status: DC | PRN
  Administered 2011-11-18: 5 mg via RESPIRATORY_TRACT
  Filled 2011-11-18: qty 0.5

## 2011-11-18 MED ORDER — METHYLPREDNISOLONE SODIUM SUCC 40 MG IJ SOLR
40.0000 mg | Freq: Four times a day (QID) | INTRAMUSCULAR | Status: DC
  Administered 2011-11-18 – 2011-11-20 (×8): 40 mg via INTRAVENOUS
  Filled 2011-11-18 (×13): qty 1

## 2011-11-18 MED ORDER — DEXTROSE 5 % IV SOLN
1.0000 g | INTRAVENOUS | Status: DC
  Administered 2011-11-18 – 2011-11-20 (×3): 1 g via INTRAVENOUS
  Filled 2011-11-18 (×4): qty 10

## 2011-11-18 MED ORDER — ALBUTEROL SULFATE HFA 108 (90 BASE) MCG/ACT IN AERS
2.0000 | INHALATION_SPRAY | Freq: Four times a day (QID) | RESPIRATORY_TRACT | Status: DC
  Filled 2011-11-18: qty 6.7

## 2011-11-18 MED ORDER — ALBUTEROL SULFATE (5 MG/ML) 0.5% IN NEBU
2.5000 mg | INHALATION_SOLUTION | RESPIRATORY_TRACT | Status: DC
  Administered 2011-11-18: 2.5 mg via RESPIRATORY_TRACT
  Filled 2011-11-18: qty 0.5

## 2011-11-18 MED ORDER — ALBUTEROL SULFATE (5 MG/ML) 0.5% IN NEBU
2.5000 mg | INHALATION_SOLUTION | RESPIRATORY_TRACT | Status: DC | PRN
  Administered 2011-11-18: 2.5 mg via RESPIRATORY_TRACT
  Filled 2011-11-18: qty 0.5

## 2011-11-18 MED ORDER — ALBUTEROL SULFATE (5 MG/ML) 0.5% IN NEBU
2.5000 mg | INHALATION_SOLUTION | RESPIRATORY_TRACT | Status: DC
  Administered 2011-11-19: 2.5 mg via RESPIRATORY_TRACT
  Filled 2011-11-18 (×2): qty 0.5

## 2011-11-18 MED FILL — Albuterol Sulfate Soln Nebu 0.5% (5 MG/ML): RESPIRATORY_TRACT | Qty: 0.5 | Status: AC

## 2011-11-18 MED FILL — Methylprednisolone Sod Succ For Inj 125 MG (Base Equiv): INTRAMUSCULAR | Qty: 1 | Status: AC

## 2011-11-18 NOTE — Progress Notes (Signed)
Clinical Child psychotherapist (CSW) completed psychosocial assessment which can be found in pt shadow chart. CSW confirmed pt from [facility] [phone]) and pt would like to return when medically ready. CSW pt psych and depression history however pt stated she was currently doing well. Pt appeared to have a difficult time communicating do to having to constantly catch her breath. CSW contacted [facility] who confirmed they are able to accept pt over the weekend as long as FL2 is faxed [phone] and transportation is provided. CSW will complete FL2 and provide follow up for weekend CSW to assist with dc.   Theresia Bough, MSW, Theresia Majors [phone]

## 2011-11-18 NOTE — Progress Notes (Signed)
Family Medicine Teaching Service Daily Resident Note  Patient name: Elizabeth Schwartz Medical record number: [id number] Date of birth: June 10, 1946 Age: 66 y.o. Gender: female  Subjective:  Pt reports feeling somewhat better today but still significant dyspnea especially with exertion.  Has not been asking for albuterol.  No acute changes  Objective: Vitals: Temp:  [97.7 F (36.5 C)-99 F (37.2 C)] 98.6 F (37 C) (11/12 0745) Pulse Rate:  [133-168] 153  (11/12 0745) Resp:  [30-49] 49  (11/12 0745) BP: (81)/(44) 81/44 mmHg (11/11 1311) SpO2:  [97 %-100 %] 97 % (11/12 0430) Weight:  [10 lb 1.7 oz (4.585 kg)] 10 lb 1.7 oz (4.585 kg) (11/12 0200)  Wt Readings from Last 3 Encounters:  09/26/11 10 lb 1.7 oz (4.585 kg) (40.91%*)   * Growth percentiles are based on WHO data.    Intake/Output Summary (Last 24 hours) at 09/26/11 0851 Last data filed at 09/26/11 0800  Gross per 24 hour  Intake    650 ml  Output    545 ml  Net    105 ml   PE: GENERAL:  Elderly caucasian female.  Examined in [facility] 2000.  In no discomfort.  Increased work of breathing with conversational dyspnea but no distress HNEENT: AT/[place], MMM, no nasal discharge, no pharyngeal erythema, no scleral icteru THORAX: HEART: RRR, s1/s2 no mumur LUNGS: Diffuse wheezing with course breath sounds in all lung fields ABDOMEN:  +BS soft non-tender, no masses, no rigidity EXTREMITIES: no peripheral edema, warm well perfused >MSK/Osteopathic: Able to sit up without assist in bed side chair.  Full ROM of UE and LE. >PSYCH: Flattened affect but appropriate demeanor.  Decreased insight in to medical conditions.  Unsure of prior psychiatric diagnoses.    Labs: CBC     Status: Abnormal   Collection Time   11/18/11  6:05 AM      Component Value Range   WBC 15.5 (*) 4.0 - 10.5 (K/uL)   RBC 3.33 (*) 3.87 - 5.11 (MIL/uL)   Hemoglobin 10.0 (*) 12.0 - 15.0 (g/dL)   HCT 40.9 (*) 81.1 - 46.0 (%)   MCV 93.1  78.0 - 100.0 (fL)   MCH 30.0  26.0 -  34.0 (pg)   MCHC 32.3  30.0 - 36.0 (g/dL)   RDW 91.4  78.2 - 95.6 (%)   Platelets 298  150 - 400 (K/uL)  BASIC METABOLIC PANEL     Status: Abnormal   Collection Time   11/18/11  6:05 AM      Component Value Range   Sodium 136  135 - 145 (mEq/L)   Potassium 4.2  3.5 - 5.1 (mEq/L)   Chloride 101  96 - 112 (mEq/L)   CO2 22  19 - 32 (mEq/L)   Glucose, Bld 164 (*) 70 - 99 (mg/dL)   BUN 23  6 - 23 (mg/dL)   Creatinine, Ser 2.13  0.50 - 1.10 (mg/dL)   Calcium 9.8  8.4 - 08.6 (mg/dL)   GFR calc non Af Amer 52 (*) >90 (mL/min)   GFR calc Af Amer 60 (*) >90 (mL/min)    Imaging: No new imaging.  Assessment & Plan: Elizabeth Schwartz is a 66 y.o. female who was admitted for shortness of breath.  1. Dyspnea (COPD exacerbation, ?RUL PNA?, O2 requirement): chest CT with concern for RUL PNA but minimally impressive.  Pt currently afebrile but persistent O2 requirement but improved overnight. Continue Pulmonary toilet. *Albuterol *Azithromycin *Symbicort *Prednisone 2. Tobacco Abuse: not currently Nicotine dependent (2 ciggs/day)  3. DM: home metformin, Cr increased from 0.89 to 1.09.  Will hold metformin today and resume as OP 4. Hypokalemia:  Continue K+ replacement. 5. Chronic Medical Conditions (depression, ?schitzophrenia?, ?dementia?, HLD, Anemia, Osteoprosis): continue home regimens; Hb stable *Clozapin *Aricept *Navane *simvastatin *fenofibrate *Calcium *Vitamin D  FEN/GI:  Heart Healthy, CHO modified IVFs & Electrolyts: Saline Lock IV - consider bolus if Cr continues to worsen PPx: Heparin 5000Units sq  Dispo:  Scheduled Albuterol with PRN available.  Will try to ween O2.  Consider D/c to group home in AM on 11/19/2011.      Gaspar Bidding, DO [facility] Resident - PGY-1 11/18/2011 1:34 PM

## 2011-11-18 NOTE — Progress Notes (Signed)
Physical Therapy Evaluation Patient Details Name: Elizabeth Schwartz MRN: [id number] DOB: 1946-01-29 Today's Date: 11/18/2011  Problem List:  Patient Active Problem List  Diagnoses  . GERD  . COPD (chronic obstructive pulmonary disease)  . Diverticulosis  . Smoker    Past Medical History:  Past Medical History  Diagnosis Date  . Hyperlipidemia   . Diabetes mellitus   . Anemia   . GERD (gastroesophageal reflux disease)   . Osteoporosis   . Depression   . COPD (chronic obstructive pulmonary disease)   . SOB (shortness of breath) on exertion    Past Surgical History:  Past Surgical History  Procedure Date  . Vaginal hysterectomy 1975  . Laparoscopy 1975    ABD pain    PT Assessment/Plan/Recommendation PT Assessment Clinical Impression Statement: Pt presents with a medical diagnosis of COPD and SOB. Pt at a modified independence level for all mobility and is at baseline functioning. No PT required. Please re-order if any questions arise PT Recommendation/Assessment: Patent does not need any further PT services No Skilled PT: Patient at baseline level of functioning;Patient will have necessary level of assist by caregiver at discharge PT Recommendation Follow Up Recommendations: None Equipment Recommended: None recommended by PT PT Goals     PT Evaluation Precautions/Restrictions    Prior Functioning  Home Living Lives With: Alone (Family care home([facility])) [place] Help From: Personal care attendant (4-5 days/week) Type of Home: Independent living facility Home Layout: One level Home Access: Level entry Bathroom Shower/Tub: Tub/shower unit;None Firefighter: Handicapped height Bathroom Accessibility: Yes How Accessible: Accessible via walker Home Adaptive Equipment: None Prior Function Level of Independence: Independent with basic ADLs;Independent with gait;Independent with transfers Able to Take Stairs?:  (N/A) Driving: Yes Vocation: On  disability Cognition Cognition Arousal/Alertness: Awake/alert Overall Cognitive Status: History of cognitive impairments History of Cognitive Impairment: Appears at baseline functioning Sensation/Coordination Sensation Light Touch: Appears Intact Extremity Assessment RLE Assessment RLE Assessment: Within Functional Limits LLE Assessment LLE Assessment: Within Functional Limits Mobility (including Balance) Bed Mobility Bed Mobility: Yes Supine to Sit: 6: Modified independent (Device/Increase time) Sitting - Scoot to Edge of Bed: 6: Modified independent (Device/Increase time) Transfers Transfers: Yes Sit to Stand: 6: Modified independent (Device/Increase time) Stand to Sit: 6: Modified independent (Device/Increase time) Ambulation/Gait Ambulation/Gait: Yes Ambulation/Gait Assistance: 6: Modified independent (Device/Increase time) Ambulation Distance (Feet): 30 Feet (distance limited by dyspnea) Assistive device: None Gait Pattern: Within Functional Limits Gait velocity: Decreased gait speed Stairs: No    Exercise    End of Session PT - End of Session Equipment Utilized During Treatment: Gait belt Activity Tolerance: Patient limited by fatigue Patient left: in bed;with call bell in reach Nurse Communication: Mobility status for transfers;Mobility status for ambulation General Behavior During Session: [facility] for tasks performed Cognition: [facility] for tasks performed  Milana Kidney 11/18/2011, 12:57 PM  11/18/2011 Milana Kidney DPT PAGER: [phone] OFFICE: [phone]

## 2011-11-18 NOTE — Progress Notes (Signed)
Called by Albin Felling, Respiratory Therapist @ [phone] to evaluate patient for increased WOB, diaphoretic and Breath sounds wheezing. Upon arrival to patients room, bedside RN and RT at bedside, pt receiving albuterol neb treatment.  Spo2 on neb treatment 98%, HR 122.  Listened to breath sounds--bilateral wheezing with audible wheeze. paradoxial breathing, patient able to speak but fragmented due to increased WOB.  MD paged and arrived at bedside.  40 mg IV solu-medrol given at 1521. @ 1530 vitals-  B/P 156/78, HR 110, RR 38

## 2011-11-18 NOTE — Progress Notes (Addendum)
Report called to 2900 Rn. Pt transferred. Elizabeth Schwartz

## 2011-11-18 NOTE — H&P (Signed)
Family Medicine Teaching Service Attending Note  I interviewed and examined patient Elizabeth Schwartz and reviewed their tests and x-rays.  I discussed with Dr. Fara Boros and reviewed their note for today.  I agree with their assessment and plan.     Additionally  Would schedule her bronchodilators  Make sure is improving well before discharge given her mental limitations

## 2011-11-18 NOTE — Progress Notes (Addendum)
RT notified. Pt working hard to breath. Diaphoretic. O2 increased to 3L. A&O x 3 but very SHOB. MD paged as well. 02 at 93%. Tachycardic. Elevated HOB to 90 degrees encouraged o2 inhalation through nose and pursed lip breathing. continuing to monitor pt. Laure Kidney [place]

## 2011-11-18 NOTE — Progress Notes (Signed)
Family Medicine Teaching Service Attending Note  I interviewed and examined patient Elizabeth Schwartz and reviewed their tests and x-rays.  I discussed with Dr. Berline Chough and reviewed their note for today.  I agree with their assessment and plan.     Additionally  Continue current copd treatment

## 2011-11-18 NOTE — Progress Notes (Signed)
Pt got OOB--pulled everything off--IV in Lt AC out--had to use bathroom. Reminded pt to call for asst--Charge RN aware--safety sitter obtained for now.

## 2011-11-18 NOTE — Progress Notes (Signed)
Inpatient Diabetes Program Recommendations  AACE/ADA: New Consensus Statement on Inpatient Glycemic Control (2009)  Target Ranges:  Prepandial:   less than 140 mg/dL      Peak postprandial:   less than 180 mg/dL (1-2 hours)      Critically ill patients:  140 - 180 mg/dL    Inpatient Diabetes Program Recommendations Correction (SSI): start sensitive sliding scale correction TID during steroid therapy

## 2011-11-19 LAB — CBC
Hemoglobin: 9.6 g/dL — ABNORMAL LOW (ref 12.0–15.0)
Platelets: 332 10*3/uL (ref 150–400)
RBC: 3.15 MIL/uL — ABNORMAL LOW (ref 3.87–5.11)
WBC: 13 10*3/uL — ABNORMAL HIGH (ref 4.0–10.5)

## 2011-11-19 LAB — GLUCOSE, CAPILLARY: Glucose-Capillary: 142 mg/dL — ABNORMAL HIGH (ref 70–99)

## 2011-11-19 LAB — BASIC METABOLIC PANEL
BUN: 31 mg/dL — ABNORMAL HIGH (ref 6–23)
Chloride: 100 mEq/L (ref 96–112)
GFR calc Af Amer: 53 mL/min — ABNORMAL LOW (ref >90)
GFR calc non Af Amer: 45 mL/min — ABNORMAL LOW (ref >90)
Potassium: 4.5 mEq/L (ref 3.5–5.1)
Sodium: 135 mEq/L (ref 135–145)

## 2011-11-19 MED ORDER — INSULIN ASPART 100 UNIT/ML ~~LOC~~ SOLN
0.0000 [IU] | Freq: Every day | SUBCUTANEOUS | Status: DC
  Administered 2011-11-19: 2 [IU] via SUBCUTANEOUS

## 2011-11-19 MED ORDER — LEVALBUTEROL HCL 1.25 MG/0.5ML IN NEBU
1.2500 mg | INHALATION_SOLUTION | Freq: Four times a day (QID) | RESPIRATORY_TRACT | Status: DC
  Administered 2011-11-19 – 2011-11-21 (×9): 1.25 mg via RESPIRATORY_TRACT
  Filled 2011-11-19 (×13): qty 0.5

## 2011-11-19 MED ORDER — IPRATROPIUM BROMIDE 0.02 % IN SOLN
0.5000 mg | RESPIRATORY_TRACT | Status: DC
Start: 2011-11-19 — End: 2011-11-19
  Administered 2011-11-19: 0.5 mg via RESPIRATORY_TRACT
  Filled 2011-11-19: qty 2.5

## 2011-11-19 MED ORDER — INSULIN ASPART 100 UNIT/ML ~~LOC~~ SOLN
0.0000 [IU] | Freq: Three times a day (TID) | SUBCUTANEOUS | Status: DC
  Administered 2011-11-19: 2 [IU] via SUBCUTANEOUS
  Administered 2011-11-19 – 2011-11-20 (×2): 1 [IU] via SUBCUTANEOUS
  Administered 2011-11-20 (×2): 2 [IU] via SUBCUTANEOUS
  Administered 2011-11-21: 3 [IU] via SUBCUTANEOUS
  Administered 2011-11-21 – 2011-11-22 (×3): 2 [IU] via SUBCUTANEOUS
  Administered 2011-11-22: 5 [IU] via SUBCUTANEOUS
  Filled 2011-11-19 (×2): qty 3

## 2011-11-19 MED ORDER — IPRATROPIUM BROMIDE 0.02 % IN SOLN
0.5000 mg | Freq: Four times a day (QID) | RESPIRATORY_TRACT | Status: DC
  Administered 2011-11-19 – 2011-11-21 (×9): 0.5 mg via RESPIRATORY_TRACT
  Filled 2011-11-19 (×9): qty 2.5

## 2011-11-19 NOTE — Progress Notes (Signed)
FMTS Attending Note  Patient seen and examined by me, I reviewed the note by Dr Durene Cal and I agree with his assessment and plan.  Patient is mildly dyspneic sitting on side of bed eating breakfast.  She reports some dyspnea, is currently with Pulse Ox 96% on 1L/min nasal canula O2. Reports that albuterol nebs do not help her dyspnea, make her feel "like I'm going to die". This morning's CBC and metabolic panel are pending.  She has remained afebrile but has had elevated WBC count, persistent dyspnea, CT angio with findings c/w PNA.  Exam: Alert, mild dyspnea, speaking fluidly, appears highly anxious.  Neck supple.  PULM Coarse sounds throughout; perhaps soft rales RML field COR S1S2 LEs without edema.  Assess/Plan: Patient with COPD and now with findings of CAP; continued dyspnea.  Has been afebrile but does have elevated WBC count (?infection versus systemic steroids?).  Would continue treatment for CAP (Cefriaxone and azithro), as well as steroid treatment.  She reports severe side effects from albuterol nebs and has marked tachycardia; agree with switch to Xopenex (levoalbuterol) versus holding scheduled nebs altogether.  Try to wean off supplemental O2; I suspect will not need to keep POx above 88%.  Agree with transfer to regular tele bed today.  Paula Compton, MD

## 2011-11-19 NOTE — Progress Notes (Signed)
PGY-1 Daily Progress Note Family Medicine Teaching Service Aldine Contes. Marti Sleigh, MD Service Pager: [phone]   Subjective: Patient was transferred to stepdown yesterday due to increased work of breathing. Currently, Patient comfortable at rest. Reports extreme dyspnea when getting up to use the bedside commode and reports takes 15 minutes for her to be comfortable again.   Objective:  Temp:  [97.5 F (36.4 C)-97.8 F (36.6 C)] 97.5 F (36.4 C) (01/05 0348) Pulse Rate:  [72-127] 98  (01/05 0455) Resp:  [20-35] 26  (01/05 0207) BP: (104-154)/(45-87) 154/71 mmHg (01/05 0330) SpO2:  [77 %-100 %] 93 % (01/05 0455) currently on 1L [place] previously at 3L. Desats to low 80s when getting up to use the restroom, hypoxia resolves with rest.  FiO2 (%):  [3 %-96 %] 96 % (01/04 1707) Weight:  [169 lb 8.5 oz (76.9 kg)] 169 lb 8.5 oz (76.9 kg) (01/04 1643)  Intake/Output Summary (Last 24 hours) at 11/19/11 0720 Last data filed at 11/19/11 0600  Gross per 24 hour  Intake    370 ml  Output    650 ml  Net   -280 ml    Gen:  Patient just getting back in bed after using bedside commode, patient with increased work of breathing/belly breathing and tachycardic to high 120s. Once patient rests for several minutes, she is able to speak in full sentences although initially she was unable to do so.  HEENT: moist mucous membranes CV: tachycardic with regular rhythm, no murmurs rubs or gallops PULM: coarse breath sounds diffusely. Prominent end expiratory wheezes.  ABD: soft/nontender/nondistended/normal bowel sounds EXT: No edema Neuro: Alert and oriented x3, grossly intact PSYCH: Flattened affect but appropriate demeanor. Decreased insight in to medical conditions. Unsure of prior psychiatric diagnoses.  Labs and imaging:   *Pending BMET from today*  CBC  Lab 11/18/11 0605 11/17/11 1818 11/17/11 1035  WBC 15.5* 12.4* 13.8*  HGB 10.0* 10.0* 10.2*  HCT 31.0* 30.4* 30.5*  PLT 298 278 243    BMET  Lab 11/18/11 0605 11/17/11 1818 11/17/11 1035  NA 136 -- 132*  K 4.2 -- 3.4*  CL 101 -- 98  CO2 22 -- 20  BUN 23 -- 19  CREATININE 1.09 0.93 0.89  LABGLOM -- -- --  GLUCOSE 164* -- --  CALCIUM 9.8 -- 9.4   No results found for this or any previous visit (from the past 24 hour(s)). Ct Angio Chest W/cm &/or Wo Cm  11/17/2011  *RADIOLOGY REPORT*  Clinical Data:  Shortness of breath, cough and tachycardia.  CT ANGIOGRAPHY CHEST WITH CONTRAST  Technique:  Multidetector CT imaging of the chest was performed using the standard protocol during bolus administration of intravenous contrast.  Multiplanar CT image reconstructions including MIPs were obtained to evaluate the vascular anatomy.  Contrast: OMNIPAQUE IOHEXOL 300 MG/ML IV SOLN  Comparison:  Chest x-ray earlier today.  Findings:  Pulmonary arteries are adequately opacified.  There is no evidence of acute pulmonary embolism.  Mild respiratory motion was present during acquisition.  Patchy airspace opacity in the anterior right upper lobe present which may represent acute pneumonia.  No overt edema or pleural effusions.  Trace pericardial fluid.  Heart size is normal.  No nodules, masses or enlarged lymph nodes are identified.  The airway shows normal patency.  The thoracic aorta is of normal caliber.  The bony thorax is unremarkable.  Review of the MIP images confirms the above findings.  IMPRESSION: Anterior right upper lobe infiltrate.  Original Report  Authenticated By: Reola Calkins, M.D.   Dg Chest Port 1 View  11/17/2011  *RADIOLOGY REPORT*  Clinical Data: Weakness, productive cough  PORTABLE CHEST - 1 VIEW  Comparison: 08/29/2011  Findings: Borderline cardiomegaly noted.  No acute infiltrate or convincing pulmonary edema.  Mild basilar atelectasis.  Central mild bronchitic changes.  IMPRESSION: No acute infiltrate or convincing pulmonary edema.  Mild basilar atelectasis.  Central mild bronchitic changes.  Original Report  Authenticated By: Natasha Mead, M.D.     Assessment  Alle Difabio is a 66 y.o. female who was admitted for shortness of breath related to a COPD exacerbation and possible RUL pneumonia 1. Dyspnea -1L o2 requirement (primarily only with exertion).  1. COPD exacerbation-transferred to stepdown unit on 1/4 due to worsening DOE/SOB/WOB. Patient also slightly tachycardic and tachypneic so will live in stepdown for now.  1. Switched to solumedrol on 1/4 from prednisone. Will continue. 2. Scheduled albuterol q4/ with q2 prn 1. Given tachycardia will discuss with team value of Xopenex  3. Will start atrovent or duonebs 4. Continue home symbicort     *Continue Pulmonary toilet. 2. Possible RUL PNA-treating with ceftriaxone. chest CT without PE but possible RUL PNA. Afebrile but does have white count  2. Tobacco Abuse: not currently Nicotine dependent (2 ciggs/day) 3. DM: plan yesterday was to hold metformin but medication was not held. Stopped this morning and transitioned to sensitive SSI. Patient had CT angio with contrast so pending BMET to monitor Cr. Previously, Cr increased from 0.89 to 1.09. Will resume metformin as OP 4. Psych(depression, ?schitzophrenia?, ?dementia?)-continue Clazapin, Aricept, Navane 5. HLD-continue simvistatin and fenofibrate 6. Osteoporosis-continue calcium, vitamin D.  7. Hx anemia-hgb stable in previous days will not recheck today.   FEN/GI: Heart Healthy, CHO modified. Hypokalemia resolved with repletion.  IVFs & Electrolyts: Saline Lock IV - consider bolus if Cr continues to worsen. Encourage PO intake.  PPx: Heparin 5000Units sq  Dispo: will attempt to wean o2 and better control COPD exacerbation. Will leave in stepdown this morning with possible transfer to floor later today vs tomorrow. Will D/c to group home when able.    Tana Conch, MD PGY1, Family Medicine Teaching Service [phone]

## 2011-11-20 ENCOUNTER — Inpatient Hospital Stay (HOSPITAL_COMMUNITY): Payer: Medicare Other

## 2011-11-20 LAB — GLUCOSE, CAPILLARY
Glucose-Capillary: 149 mg/dL — ABNORMAL HIGH (ref 70–99)
Glucose-Capillary: 163 mg/dL — ABNORMAL HIGH (ref 70–99)

## 2011-11-20 LAB — BASIC METABOLIC PANEL
BUN: 29 mg/dL — ABNORMAL HIGH (ref 6–23)
CO2: 28 mEq/L (ref 19–32)
Chloride: 102 mEq/L (ref 96–112)
Creatinine, Ser: 1.07 mg/dL (ref 0.50–1.10)
Glucose, Bld: 160 mg/dL — ABNORMAL HIGH (ref 70–99)

## 2011-11-20 LAB — CBC
HCT: 30.3 % — ABNORMAL LOW (ref 36.0–46.0)
MCV: 93.8 fL (ref 78.0–100.0)
RBC: 3.23 MIL/uL — ABNORMAL LOW (ref 3.87–5.11)
RDW: 14.9 % (ref 11.5–15.5)
WBC: 7.8 10*3/uL (ref 4.0–10.5)

## 2011-11-20 IMAGING — CR DG CHEST 2V
2 series · 2 of 2 positions shown · non-contrast
Comparison: Chest x-ray [DATE].

CLINICAL DATA: Shortness of breath and wheezing.

CHEST - 2 VIEW

[w chest pa]
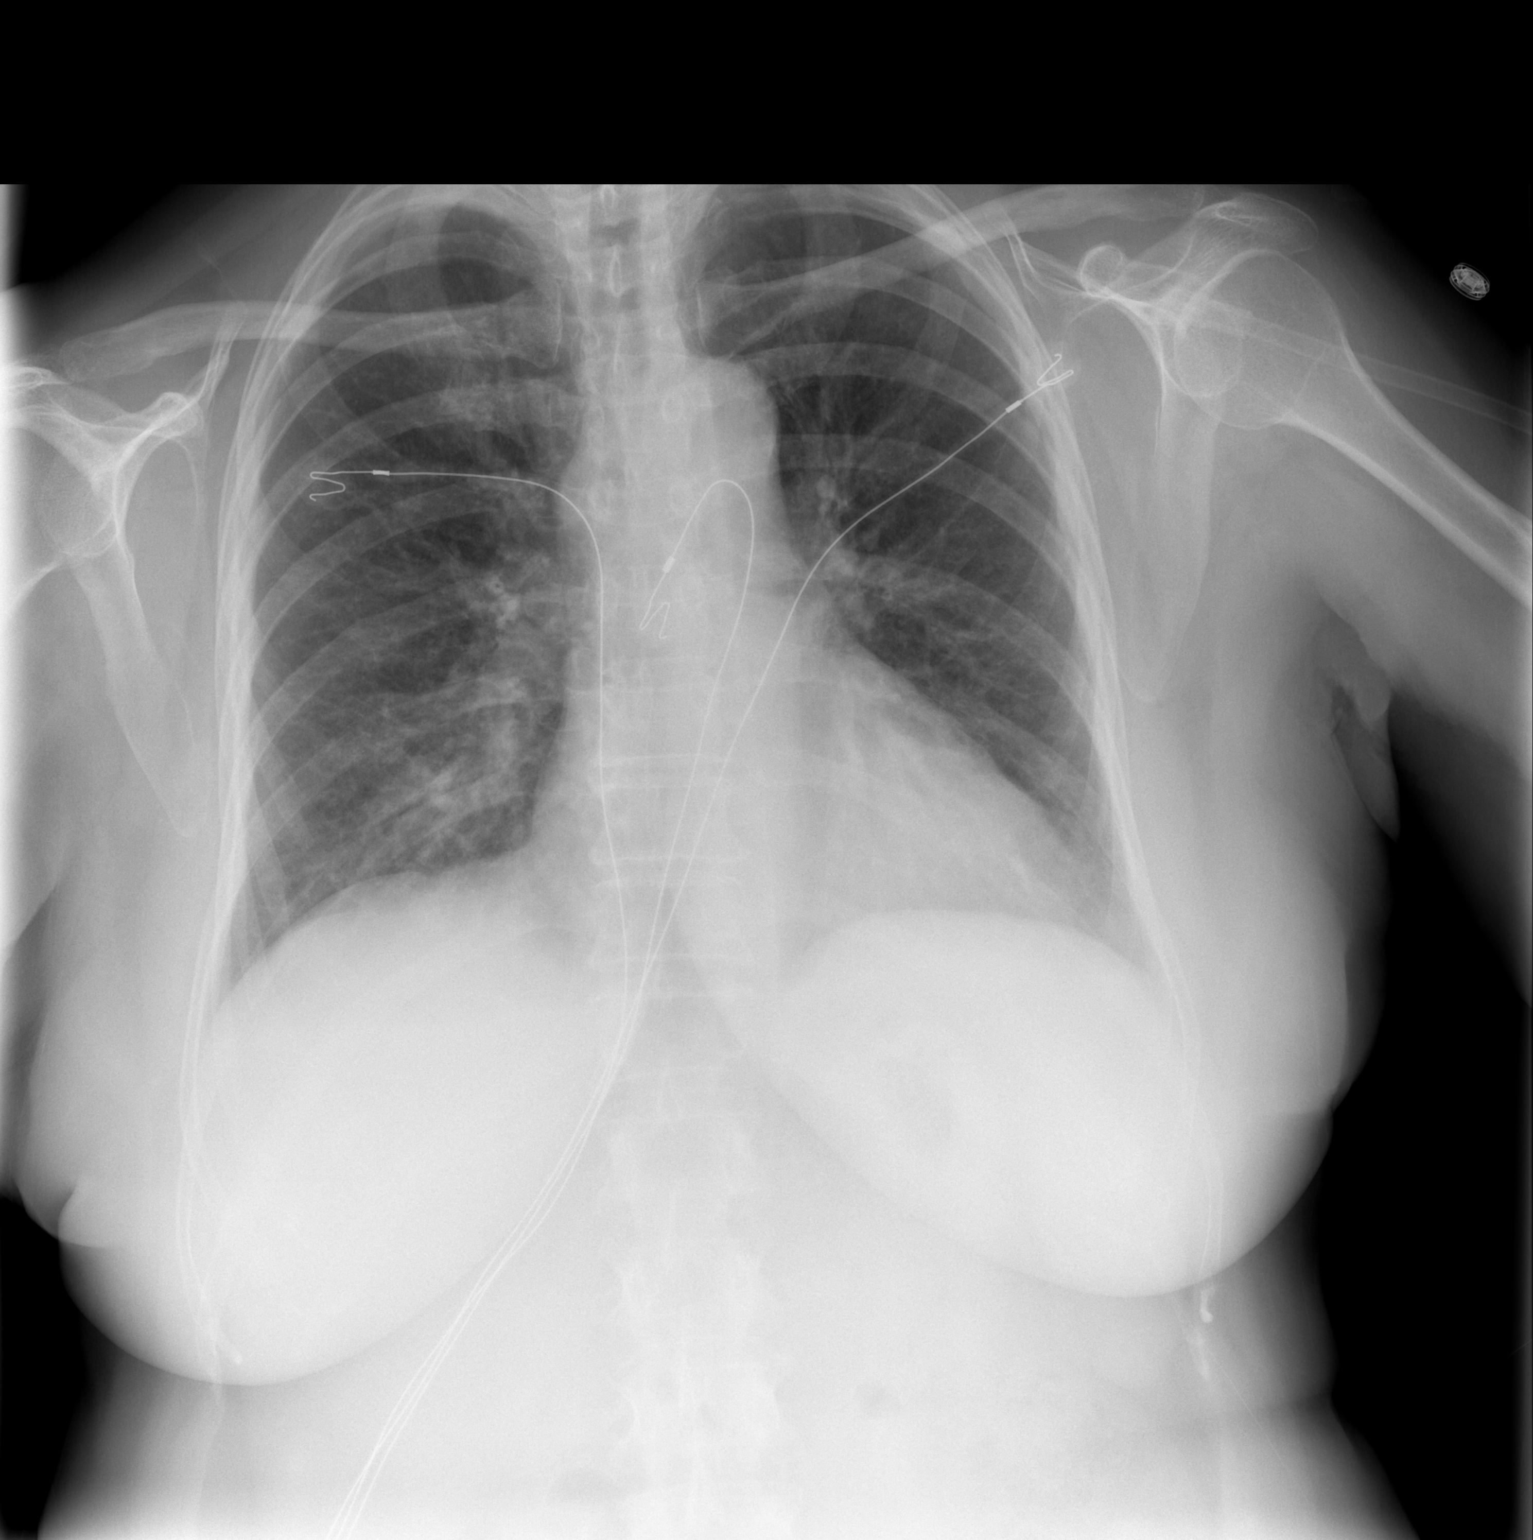

[w chest lat]
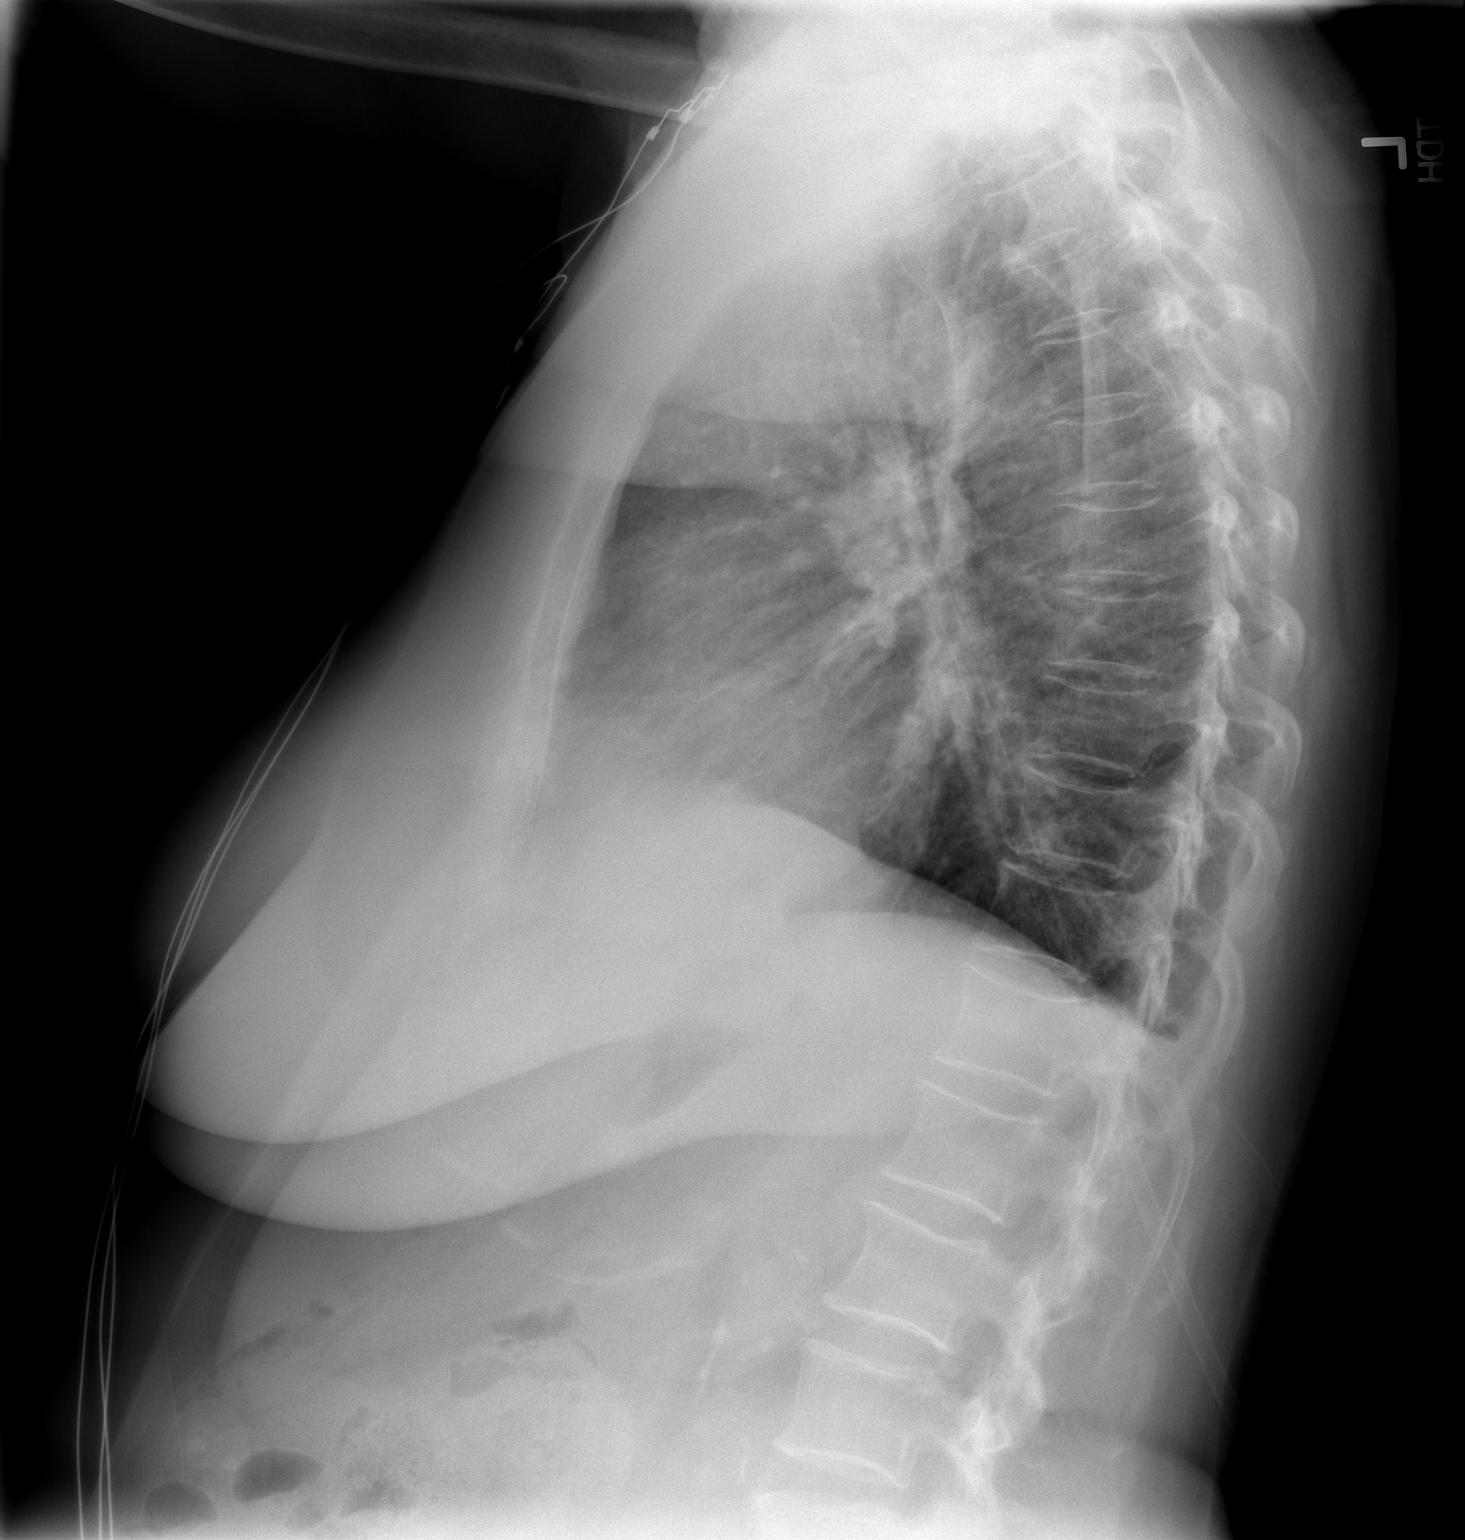

[2 of 2 positions shown; findings below may reference images not displayed]

FINDINGS: The cardiac silhouette, mediastinal and hilar contours
are within normal limits and stable.  Minimal patchy right lung
infiltrates as noted on recent chest CT.  No pleural effusion.
IMPRESSION: Minimal patchy right lung infiltrates.

## 2011-11-20 MED ORDER — PANTOPRAZOLE SODIUM 40 MG PO TBEC
40.0000 mg | DELAYED_RELEASE_TABLET | Freq: Every day | ORAL | Status: DC
  Administered 2011-11-20 – 2011-11-22 (×3): 40 mg via ORAL
  Filled 2011-11-20 (×3): qty 1

## 2011-11-20 MED ORDER — PREDNISONE 50 MG PO TABS
50.0000 mg | ORAL_TABLET | Freq: Every day | ORAL | Status: DC
  Administered 2011-11-20 – 2011-11-22 (×3): 50 mg via ORAL
  Filled 2011-11-20 (×4): qty 1

## 2011-11-20 NOTE — Progress Notes (Signed)
Patient ID: Elizabeth Schwartz, female   DOB: 07/07/1946, 66 y.o.   MRN: [id number] Family Medicine Teaching Service Daily Resident Note  Patient name: Elizabeth Schwartz Medical record number: [id number] Date of birth: 1946-05-24 Age: 66 y.o. Gender: female Length of Stay:  LOS: 3 days   Subjective: Pt reports feeling better today. Still having persistent dyspnea especially with minimal exertion however does not report "feeling like she is going to die".  No new concerns expressed this time. Voices understanding of treatment plan.  Objective: Vitals: Patient Vitals for the past 24 hrs:  BP Temp Temp src Pulse Resp SpO2 Weight  11/20/11 0935 - - - - - 99 % -  11/20/11 0826 - 98.9 F (37.2 C) Oral - - - -  11/20/11 0600 - 97.9 F (36.6 C) Oral - - - -  11/20/11 0500 - - - 92  - 89 % 164 lb 7.4 oz (74.6 kg)  11/20/11 0400 129/67 mmHg - - 92  - 91 % -  11/20/11 0200 - - - 129  - 92 % -  11/20/11 0000 - - - 99  - 95 % -  11/19/11 2352 138/74 mmHg 98 F (36.7 C) Oral 99  18  96 % -  11/19/11 2300 - - - 99  - 94 % -  11/19/11 2200 - - - 118  - 94 % -  11/19/11 2100 - - - 106  - 94 % -  11/19/11 2000 138/62 mmHg 98.1 F (36.7 C) Oral 125  23  95 % -  11/19/11 1933 - - - - - 96 % -  11/19/11 1600 150/50 mmHg - - - - - -  11/19/11 1200 130/70 mmHg - - - - - -  11/19/11 1145 - 98.7 F (37.1 C) Oral - - - -   Wt Readings from Last 3 Encounters:  11/20/11 164 lb 7.4 oz (74.6 kg)  10/11/11 168 lb (76.204 kg)  09/08/11 166 lb 12.8 oz (75.66 kg)    Intake/Output Summary (Last 24 hours) at 11/20/11 1141 Last data filed at 11/20/11 0900  Gross per 24 hour  Intake    650 ml  Output   2225 ml  Net  -1575 ml    PE: GENERAL: Adult Caucasian female examined in [facility] 2900. Sleeping comfortably but awakens easily. In no respiratory distress while sleeping however with minimal exertion does become tachypneic and have abdominal breathing. H&N: Atraumatic normocephalic, moist mucous membranes, extraocular  muscles intact, no scleral icterus HEART: Tachycardic with regular rhythm, S1-S2 heard, no murmur LUNGS: Diffusely coarse breath sounds with prolonged expiratory phase and expiratory wheezing. ABDOMEN: Positive bowel sounds soft nontender no organomegaly, no guarding, no rigidity EXTREMITIES: Moves all 4 extremities spontaneously, no peripheral edema, 2+ out of 4 distal lower extremity pulses   Labs: Hematology:  Lab 11/20/11 0615 11/19/11 0941 11/18/11 0605  WBC 7.8 13.0* 15.5*  HGB 9.6* 9.6* 10.0*  HCT 30.3* 29.6* 31.0*  PLT 355 332 298  RDW 14.9 14.8 14.9  MCV 93.8 94.0 93.1  MCHC 31.7 32.4 32.3   Metabolic:  Lab 11/20/11 0615 11/19/11 0610 11/18/11 0605  NA 141 135 136  K 4.2 4.5 4.2  CL 102 100 101  CO2 28 22 22   BUN 29* 31* 23  CREATININE 1.07 1.22* 1.09  GLUCOSE 160* 152* 164*  CALCIUM 11.0* 10.0 9.8   Diabetes Management:  Lab 11/20/11 0830 11/19/11 2148 11/19/11 1604  GLUCAP 152* 205* 120*   Micro: none  Imaging:  No new imaging  Assessment & Plan: Elizabeth Schwartz is a 66 y.o. female who was admitted for shortness of breath.  1. Dyspnea (COPD exacerbation, ?RUL PNA?, O2 requirement): Transferred to SDU on 1/4 due to increased WOB, & DOE.  Has had occasional desaturations to lower 80s with exertion however sating well at rest with no increased WOB.  Will continue pulmonary toilet and transition from solumedrol to Prednisone today.  Repeat Chest X-ray today for continued increased WOB  Consider transition from atrovent to spiriva prior to D/c.    Continue Pulmonary toilet. *Xopenex *Symbicort *Atrovent *Prednisone  Continue double coverage for PNA *Azithromycin *Rocephin [ ]  CXR 2. Tachycardia:  Baseline between 100-110 per OP visits at pulmonology.  B-agonist also likely contributing 3. Tobacco Abuse: not currently Nicotine dependent (2 ciggs/day) 4. DM: on SSI; resume metformin at D/c; Cr improved to 1.07 5. Hypokalemia: resolved 6. Chronic Medical  Conditions (depression, ?schitzophrenia?, ?dementia?, HLD, Anemia, Osteoprosis): continue home regimens; Hb stable *Clozapin  *Aricept  *Navane  *simvastatin  *fenofibrate  *Calcium  *Vitamin D   FEN/GI: Heart Healthy, CHO modified  IVFs & Electrolyts: Saline Lock IV - consider bolus if Cr continues to worsen  PPx: Heparin 5000Units sq & Protonix 40mg  po bid Dispo: will attempt to wean o2 and better control COPD exacerbation. Floor status in SDU bed currently, pending transfer.  Will D/c to group home when able.   Gaspar Bidding, DO Family Medicine Resident PGY-1 11/20/2011 11:41 AM

## 2011-11-21 LAB — GLUCOSE, CAPILLARY
Glucose-Capillary: 171 mg/dL — ABNORMAL HIGH (ref 70–99)
Glucose-Capillary: 211 mg/dL — ABNORMAL HIGH (ref 70–99)

## 2011-11-21 MED ORDER — LEVOFLOXACIN 500 MG PO TABS
500.0000 mg | ORAL_TABLET | Freq: Every day | ORAL | Status: DC
  Administered 2011-11-21 – 2011-11-22 (×2): 500 mg via ORAL
  Filled 2011-11-21 (×2): qty 1

## 2011-11-21 MED ORDER — TIOTROPIUM BROMIDE MONOHYDRATE 18 MCG IN CAPS
18.0000 ug | ORAL_CAPSULE | Freq: Every day | RESPIRATORY_TRACT | Status: DC
  Administered 2011-11-21 – 2011-11-22 (×2): 18 ug via RESPIRATORY_TRACT
  Filled 2011-11-21: qty 5

## 2011-11-21 MED ORDER — ALBUTEROL SULFATE (5 MG/ML) 0.5% IN NEBU: 2.5000 mg | INHALATION_SOLUTION | RESPIRATORY_TRACT | Status: DC | PRN

## 2011-11-21 MED ORDER — ALBUTEROL SULFATE HFA 108 (90 BASE) MCG/ACT IN AERS
2.0000 | INHALATION_SPRAY | Freq: Four times a day (QID) | RESPIRATORY_TRACT | Status: DC
  Administered 2011-11-21 – 2011-11-22 (×4): 2 via RESPIRATORY_TRACT
  Filled 2011-11-21: qty 6.7

## 2011-11-21 NOTE — Progress Notes (Signed)
Order received for home oxygen, however no RA oxygen sat recorded, will need room air sat and amount of oxygen flow needed to elevate level to oxygen saturation to > 90% . MD notified and will order.  Johny Shock RN MPH Case manager [phone]

## 2011-11-21 NOTE — Progress Notes (Signed)
Family Medicine Teaching Service Daily Resident Note  Patient name: Elizabeth Schwartz Medical record number: [id number] Date of birth: 1946-11-11 Age: 66 y.o. Gender: female Length of Stay:  LOS: 4 days   Subjective: Mrs. reports feeling much better this morning. She has decreased dyspnea on exertion, and reports sleeping well last night. No new complaints at this time  Objective: Vitals: Patient Vitals for the past 24 hrs:  BP Temp Temp src Pulse Resp SpO2  11/21/11 0527 106/81 mmHg 97.6 F (36.4 C) Oral 95  21  92 %  11/20/11 2125 155/87 mmHg 98 F (36.7 C) Oral 94  18  92 %  11/20/11 2045 - - - - - 92 %  11/20/11 1700 152/78 mmHg 97.6 F (36.4 C) Oral 111  18  90 %  11/20/11 1622 - 98.5 F (36.9 C) Oral - - -  11/20/11 1600 150/68 mmHg - - - - -  11/20/11 1501 - - - - - 92 %  11/20/11 1200 147/94 mmHg - - 104  - 93 %  11/20/11 1132 - 98.8 F (37.1 C) Oral - - -  11/20/11 0935 - - - - - 99 %   Wt Readings from Last 3 Encounters:  11/20/11 164 lb 7.4 oz (74.6 kg)  10/11/11 168 lb (76.204 kg)  09/08/11 166 lb 12.8 oz (75.66 kg)    Intake/Output Summary (Last 24 hours) at 11/21/11 0918 Last data filed at 11/20/11 1700  Gross per 24 hour  Intake    650 ml  Output    350 ml  Net    300 ml    PE: GENERAL: Adult Caucasian female examined in [facility] 6700. Resting comfortably in bed, in no acute respiratory distress while laying. Is able to sit up the side of the bed without exertional dyspnea.  Patient currently on 3 L of nasal cannula oxygen. Turned down to 1 L and pulse ox checked approximately 5 minutes later saturations at 90%. H&N: Atraumatic normocephalic, moist mucous membranes, extraocular muscles intact, no scleral icterus  HEART: Tachycardic with regular rhythm, S1-S2 heard, no murmur  LUNGS: Diffusely coarse breath sounds with prolonged expiratory phase and expiratory wheezing, improved from yesterday. ABDOMEN: Positive bowel sounds soft nontender no organomegaly, no  guarding, no rigidity  EXTREMITIES: Moves all 4 extremities spontaneously, no peripheral edema, 2+ out of 4 distal lower extremity pulses   Labs: Hematology:  Lab 11/20/11 0615 11/19/11 0941 11/18/11 0605  WBC 7.8 13.0* 15.5*  HGB 9.6* 9.6* 10.0*  HCT 30.3* 29.6* 31.0*  PLT 355 332 298  RDW 14.9 14.8 14.9  MCV 93.8 94.0 93.1  MCHC 31.7 32.4 32.3   Metabolic:  Lab 11/20/11 0615 11/19/11 0610 11/18/11 0605  NA 141 135 136  K 4.2 4.5 4.2  CL 102 100 101  CO2 28 22 22   BUN 29* 31* 23  CREATININE 1.07 1.22* 1.09  GLUCOSE 160* 152* 164*  CALCIUM 11.0* 10.0 9.8   Diabetes Management:  Lab 11/21/11 0028 11/20/11 1621 11/20/11 1225  GLUCAP 171* 163* 149*   Micro: None  Imaging: C-Ray 11/20/2011: Minimal patchy right lung infiltrates.  Assessment & Plan: Elizabeth Schwartz is a 66 y.o. female who was admitted for shortness of breath.  1. Dyspnea (COPD exacerbation, ?RUL PNA?, O2 requirement): Transferred to SDU on 1/4 due to increased WOB, & DOE.   Work of breathing and dyspnea exertion has improved and is currently telemetry status. Continues to have occasional desaturations to lower 80s (not documented per nursing report)  with exertion. Has had occasional desaturations to lower 80s with exertion however sating well at rest with no increased WOB.  Will obtain ambulatory pulse ox and work on home O2.    Continue Pulmonary toilet. *Xopenex prn *Symbicort *Spiriva <  *Prednisone <   Completed course of Rocephin, Azithro completed tomorrow.   *Azithromycin Tachycardia:  Baseline between 100-110 per OP visits at pulmonology.  B-agonist also likely contributing; transition xopenex to PRN 3. Tobacco Abuse: not currently Nicotine dependent (2 ciggs/day) 4. DM: on SSI; resume metformin at D/c; Cr improved to 1.07 5. Hypokalemia: resolved 6. Chronic Medical Conditions (depression, ?schitzophrenia?, ?dementia?, HLD, Anemia, Osteoprosis): continue home regimens; Hb stable *Clozapin    *Aricept  *Navane  *simvastatin  *fenofibrate  *Calcium  *Vitamin D   FEN/GI: Heart Healthy, CHO modified  IVFs & Electrolyts: Saline Lock IV - consider bolus if Cr continues to worsen  PPx: Heparin 5000Units sq & Protonix 40mg  po bid  Dispo: Continue O2 ween.  Working on home O2.  Last day of ABX tomorrow.  Will plan on D/c to group home tomorrow.    Gaspar Bidding, DO Family Medicine Resident PGY-1 11/20/2011 11:41 AM

## 2011-11-21 NOTE — Progress Notes (Signed)
Pt unable to tolerate RA. Pt's O2 saturation decreased to 84% on RA at rest after 5 min with no oxygen. Breathing was also notably more labored. Pt sats returned to 88% on 1 L/m via n/c. Increased pt. To 2 l/m via n/c to maintain oxygen saturation above 90%. Dondra Spry

## 2011-11-21 NOTE — Progress Notes (Signed)
I interviewed and examined this patient and discussed the care plan with Dr.Rigby and the FPTS team and agree with assessment and plan as documented in the progress note for today.     A. Sheffield Slider, MD Family Medicine Teaching Service Attending  11/21/2011 7:48 PM

## 2011-11-22 LAB — GLUCOSE, CAPILLARY
Glucose-Capillary: 176 mg/dL — ABNORMAL HIGH (ref 70–99)
Glucose-Capillary: 160 mg/dL — ABNORMAL HIGH (ref 70–99)

## 2011-11-22 MED ORDER — PREDNISONE 50 MG PO TABS: 50.0000 mg | ORAL_TABLET | Freq: Every day | ORAL | Status: DC

## 2011-11-22 MED ORDER — LEVOFLOXACIN 500 MG PO TABS: 500.0000 mg | ORAL_TABLET | Freq: Every day | ORAL | Status: DC

## 2011-11-22 MED ORDER — PANTOPRAZOLE SODIUM 40 MG PO TBEC: 40.0000 mg | DELAYED_RELEASE_TABLET | Freq: Every day | ORAL | Status: DC

## 2011-11-22 MED ORDER — TIOTROPIUM BROMIDE MONOHYDRATE 18 MCG IN CAPS: 18.0000 ug | ORAL_CAPSULE | Freq: Every day | RESPIRATORY_TRACT | Status: DC

## 2011-11-22 MED ORDER — LEVOFLOXACIN 500 MG PO TABS: 500.0000 mg | ORAL_TABLET | Freq: Every day | ORAL | Status: AC

## 2011-11-22 NOTE — Progress Notes (Signed)
Spoke with group home administrator , who ask that the d/c instructions/summary states that pt may self administer oxygen. This is a family care home and no health services are offered. There is no problem with pt receiving oxygen or with concentrator being delivered.  Will order from [facility].  Johny Shock RN MPH , case manager [phone]

## 2011-11-22 NOTE — Progress Notes (Signed)
Family Medicine Teaching Service Attending Note  I interviewed and examined patient Elizabeth Schwartz and reviewed their tests and x-rays.  I discussed with Dr. Berline Chough and reviewed their note for today.  I agree with their assessment and plan.

## 2011-11-22 NOTE — Discharge Summary (Signed)
Physician Discharge Summary  Patient ID: Elizabeth Schwartz MRN: [id number] DOB/AGE: 1945/11/17 66 y.o.  Admit date: 11/17/2011 Discharge date: 11/22/2011  Admission Diagnoses: COPD Exacerbation  Discharge Diagnoses:  Principal Problem:  *COPD (chronic obstructive pulmonary disease) Active Problems:  GERD  Smoker DM Anemia GERD Osteoporosis Depression Unknown Psychiatric Dx  Discharged Condition: good  Hospital Course: Elizabeth Schwartz presented to the [facility] emergency department following one-day history of worsening cough, and shortness of breath.  On further evaluation in the emergency department she was determined to have a COPD exacerbation with a concern for a right upper lobe pneumonia found on CTA while ruling out pulmonary embolism.  She was initially started on 2 L nasal cannula with albuterol and Atrovent nebulizers, continued on her home Symbicort, ceftriaxone times one emergency department, and begun on a five-day course of azithromycin. She is a started on burst of prednisone at the time of admission.  The day after admission she did have acute worsening of her respiratory symptoms and was in moderate respiratory distress with only minimal response to her 2.5 mg albuterol nebulizers. She was changed to Solu-Medrol 40 mg IV every 6 hours (with ppi for PUD prophylasix), advanced to 5 mg albuterol nebulizers every 4hours + every 2 hours when necessary, and maintained on a course of Rocephin and azithromycin.  She is transferred to the step down unit for her respiratory distress and was monitored closely.  She reported some adverse effects to the albuterol nebulizers and was switched to Xopenex.  She steadily improved and was able to be transferred back to floor status. She was found to have increased oxygen demand however and will require home oxygen that she will self administer for desaturations to the low 80s with ambulation on room air.  She was transitioned back to her home respiratory  regimen including albuterol MDI, albuterol nebs when necessary, and Symbicort; she was also begun on Spiriva the day prior to discharge and was discontinued off of Atrovent.  She finished both a five-day course of azithromycin and and was transitioned to Avelox to finish a 10 day course of ABX for the day of discharge and will not require antibiotics as an outpatient.  She will need an additional 5 days of prednisone give her a 10 day total course.  Of note she does have an underlying baseline tachycardia approximately 100-110 as noted in prior pulmonary outpatient visits. She was persistently in sinus tachycardia while in the hospital exacerbated by her albuterol treatments. She complained of no chest pain or other cardiac symptoms.  She is working on cutting down on tobacco abuse and smoking 2 cigarettes per day we would encourage her to continue to quit.    Regarding her diagnosis of diabetes she was on sliding scale insulin while hospitalized do to a small increase in her creatinine at the time of admission due to poor by mouth intake. We'll resume her metformin and stop her sliding scale insulin time of discharge.  Consults: none  Day of Discharge  BP 132/58  Pulse 102  Temp(Src) 98.4 F (36.9 C) (Oral)  Resp 20  Ht 5\' 2"  (1.575 m)  Wt 164 lb 7.4 oz (74.6 kg)  BMI 30.08 kg/m2  SpO2 91% PE: GENERAL: Adult Caucasian female examined in [facility] 6700. Resting comfortably in bedside chair.  In no acute respiratory distress, [place] in place @ 2 L.   H&N: Atraumatic normocephalic, moist mucous membranes, extraocular muscles intact, no scleral icterus  HEART: Tachycardic with regular rhythm, S1-S2 heard,  no murmur  LUNGS: Diffusely coarse breath sounds with prolonged expiratory phase and expiratory wheezing, improved from yesterday.  ABDOMEN: Positive bowel sounds soft nontender no organomegaly, no guarding, no rigidity  EXTREMITIES: Moves all 4 extremities spontaneously, no peripheral edema, 2+ out of 4  distal lower extremity pulses   Hematology:  Lab 11/20/11 0615 11/19/11 0941 11/18/11 0605  WBC 7.8 13.0* 15.5*  HGB 9.6* 9.6* 10.0*  HCT 30.3* 29.6* 31.0*  PLT 355 332 298  RDW 14.9 14.8 14.9  MCV 93.8 94.0 93.1  MCHC 31.7 32.4 32.3   Metabolic:  Lab 11/20/11 0615 11/19/11 0610 11/18/11 0605  NA 141 135 136  K 4.2 4.5 4.2  CL 102 100 101  CO2 28 22 22   BUN 29* 31* 23  CREATININE 1.07 1.22* 1.09  GLUCOSE 160* 152* 164*  CALCIUM 11.0* 10.0 9.8  MG -- -- --  PHOS -- -- --   Diabetes Management:  Lab 11/22/11 0745 11/21/11 2121 11/21/11 1647 11/21/11 1123 11/21/11 1018 11/21/11 0028 11/20/11 1621 11/20/11 1225 11/20/11 0830 11/19/11 2148  GLUCAP 91 176* 154* 178* 211* 171* 163* 149* 152* 205*    Imaging During Hospitalization: CXR 11/17/11: No acute infiltrate or convincing pulmonary edema. Mild basilar atelectasis. Central mild bronchitic changes.  CTA Chest 11/17/11: Anterior right upper lobe infiltrate.  CXR 11/20/11: Minimal patchy right lung infiltrates. Disposition: Home or Self Care  Discharge Orders    Future Orders Please Complete By Expires   Diet - low sodium heart healthy      Increase activity slowly      Call MD for:  temperature >100.4      Call MD for:        Medication List  As of 11/22/2011 11:28 AM   START taking these medications         levofloxacin 500 MG tablet   Commonly known as: LEVAQUIN   Take 1 tablet (500 mg total) by mouth daily. First Dose 11/22/2011 at dinner time      pantoprazole 40 MG tablet   Commonly known as: PROTONIX   Take 1 tablet (40 mg total) by mouth daily at 12 noon. Take until completed 14 days      predniSONE 50 MG tablet   Commonly known as: DELTASONE   Take 1 tablet (50 mg total) by mouth daily.      tiotropium 18 MCG inhalation capsule   Commonly known as: SPIRIVA   Place 1 capsule (18 mcg total) into inhaler and inhale daily. New long term med         CONTINUE taking these medications         * albuterol  (2.5 MG/3ML) 0.083% nebulizer solution   Commonly known as: PROVENTIL      * albuterol 108 (90 BASE) MCG/ACT inhaler   Commonly known as: PROVENTIL HFA;VENTOLIN HFA   Inhale 2 puffs into the lungs every 6 (six) hours as needed for wheezing.      alendronate 35 MG tablet   Commonly known as: FOSAMAX      budesonide-formoterol 160-4.5 MCG/ACT inhaler   Commonly known as: SYMBICORT   Take 2 puffs first thing in am and then another 2 puffs about 12 hours later.           calcium carbonate 600 MG Tabs   Commonly known as: OS-CAL      cloZAPine 100 MG tablet   Commonly known as: CLOZARIL      donepezil 10 MG tablet  Commonly known as: ARICEPT      fenofibrate 160 MG tablet      Fiber 5 GM/15ML Liqd      metFORMIN 500 MG tablet   Commonly known as: GLUCOPHAGE      simvastatin 40 MG tablet   Commonly known as: ZOCOR      thiothixene 5 MG capsule   Commonly known as: NAVANE      VITAMIN D (CHOLECALCIFEROL) PO     * Notice: This list has 2 medication(s) that are the same as other medications prescribed for you. Read the directions carefully, and ask your doctor or other care provider to review them with you.        Where to get your medications    These are the prescriptions that you need to pick up. We sent them to a specific pharmacy, so you will need to go there to get them.   [facility] [place] [place] - [address]    [address] [place] [address]    Phone: [phone]        levofloxacin 500 MG tablet   pantoprazole 40 MG tablet   predniSONE 50 MG tablet   tiotropium 18 MCG inhalation capsule            Follow Up issues: Will need continued management of her on going medical needs.  Continue to address smoking cessation as well.  Will need follow up with Pulmonology as previously scheduled.  Continue to evaluate for home O2 needs and anticipate discontinuation of O2 after acute illness although we suspect she chronically  desaturates to the high 80s at baseline.    Gaspar Bidding, DO [facility] Family [facility] Resident - PGY-1 11/22/2011 11:28 AM

## 2011-11-22 NOTE — Progress Notes (Signed)
Clinical Child psychotherapist (CSW) aware that pt ready for dc. CSW has faxed updated FL2 to facility and has been trying to reach someone there to confirm receipt. CSW will continue to call and confirm receipt prior to contacting transportation.  Theresia Bough, MSW, Theresia Majors [phone]

## 2011-11-22 NOTE — Progress Notes (Signed)
Clinical Child psychotherapist (CSW) spoke with Theatre stage manager who informed CSW to proceed with transportation for pt. CSW prepared pt dc packet, informed pt of transfer and contacted [facility] for transportation. CSW is signing off.  Theresia Bough, MSW, Theresia [facility]ors [phone]

## 2011-11-22 NOTE — Discharge Summary (Signed)
I interviewed and examined this patient and discussed the care plan with Dr. Berline Chough and the [facility] team and agree with assessment and plan as documented in the discharge note for today.     A. Sheffield Slider, MD Family Medicine Teaching Service Attending  11/22/2011 3:11 PM

## 2011-11-22 NOTE — Progress Notes (Signed)
Pt. Unable to leave at this time. Home oxygen not available at patient residence at this time. To re-notify transport when O2 is available. Pt residence notified to call upon delivery. Dondra Spry

## 2011-11-23 NOTE — Progress Notes (Signed)
   CARE MANAGEMENT NOTE 11/23/2011  Patient:  Elizabeth Schwartz,Elizabeth Schwartz   Account Number:  [id number]  Date Initiated:  11/21/2011  Documentation initiated by:  Johny Shock  Subjective/Objective Assessment:   Order for home O2     Action/Plan:   Unable to setup as not room air sat recorded, MD notified. Will cont to follow.  11/22/2011 D/c to group home with home oxygen   Anticipated DC Date:  11/22/2011   Anticipated DC Plan:  HOME W HOME HEALTH SERVICES         Choice offered to / List presented to:     DME arranged  [facility]      DME agency  [facility]        Status of service:  Completed, signed off Medicare Important Message given?   (If response is "NO", the following Medicare IM given date fields will be blank) Date Medicare IM given:   Date Additional Medicare IM given:    Discharge Disposition:  HOME W HOME HEALTH SERVICES  Per UR Regulation:  Reviewed for med. necessity/level of care/duration of stay  Comments:  11/22/2011 O2 sats recorded 11/21/2011 pm and pt qualifies for home O2, arranged with [facility] for delivery to group home for pt use. CRoyal RN MPH   11/20/2010 Order for home oxygen, however not room air sats recorded, MD will order. Luz Lex RN MPH Case Manager [phone]

## 2011-12-08 ENCOUNTER — Emergency Department (HOSPITAL_COMMUNITY): Payer: Medicare Other

## 2011-12-08 ENCOUNTER — Emergency Department (HOSPITAL_COMMUNITY)
Admission: EM | Admit: 2011-12-08 | Discharge: 2011-12-08 | Disposition: A | Discharge status: Home / Self Care | Payer: Medicare Other | Attending: Emergency Medicine | Admitting: Emergency Medicine

## 2011-12-08 ENCOUNTER — Other Ambulatory Visit: Payer: Self-pay

## 2011-12-08 ENCOUNTER — Encounter (HOSPITAL_COMMUNITY): Payer: Self-pay

## 2011-12-08 DIAGNOSIS — R079 Chest pain, unspecified: Secondary | ICD-10-CM | POA: Diagnosis not present

## 2011-12-08 DIAGNOSIS — Z79899 Other long term (current) drug therapy: Secondary | ICD-10-CM | POA: Insufficient documentation

## 2011-12-08 DIAGNOSIS — F329 Major depressive disorder, single episode, unspecified: Secondary | ICD-10-CM | POA: Insufficient documentation

## 2011-12-08 DIAGNOSIS — I700 Atherosclerosis of aorta: Secondary | ICD-10-CM | POA: Diagnosis not present

## 2011-12-08 DIAGNOSIS — F3289 Other specified depressive episodes: Secondary | ICD-10-CM | POA: Insufficient documentation

## 2011-12-08 DIAGNOSIS — R1013 Epigastric pain: Secondary | ICD-10-CM | POA: Diagnosis not present

## 2011-12-08 DIAGNOSIS — J449 Chronic obstructive pulmonary disease, unspecified: Secondary | ICD-10-CM | POA: Diagnosis not present

## 2011-12-08 DIAGNOSIS — R0602 Shortness of breath: Secondary | ICD-10-CM | POA: Insufficient documentation

## 2011-12-08 DIAGNOSIS — K219 Gastro-esophageal reflux disease without esophagitis: Secondary | ICD-10-CM | POA: Diagnosis not present

## 2011-12-08 DIAGNOSIS — E119 Type 2 diabetes mellitus without complications: Secondary | ICD-10-CM | POA: Insufficient documentation

## 2011-12-08 DIAGNOSIS — E785 Hyperlipidemia, unspecified: Secondary | ICD-10-CM | POA: Diagnosis not present

## 2011-12-08 DIAGNOSIS — J4489 Other specified chronic obstructive pulmonary disease: Secondary | ICD-10-CM | POA: Insufficient documentation

## 2011-12-08 LAB — CBC
HCT: 33.5 % — ABNORMAL LOW (ref 36.0–46.0)
Hemoglobin: 10.5 g/dL — ABNORMAL LOW (ref 12.0–15.0)
MCH: 29.8 pg (ref 26.0–34.0)
MCHC: 31.3 g/dL (ref 30.0–36.0)
RBC: 3.52 MIL/uL — ABNORMAL LOW (ref 3.87–5.11)

## 2011-12-08 LAB — DIFFERENTIAL
Lymphs Abs: 1.8 10*3/uL (ref 0.7–4.0)
Monocytes Absolute: 0.5 10*3/uL (ref 0.1–1.0)
Monocytes Relative: 8 % (ref 3–12)
Neutro Abs: 4.4 10*3/uL (ref 1.7–7.7)
Neutrophils Relative %: 65 % (ref 43–77)

## 2011-12-08 LAB — COMPREHENSIVE METABOLIC PANEL
Alkaline Phosphatase: 31 U/L — ABNORMAL LOW (ref 39–117)
BUN: 11 mg/dL (ref 6–23)
Chloride: 105 mEq/L (ref 96–112)
Creatinine, Ser: 0.79 mg/dL (ref 0.50–1.10)
GFR calc Af Amer: >90 mL/min (ref >90)
GFR calc non Af Amer: 86 mL/min — ABNORMAL LOW (ref >90)
Glucose, Bld: 153 mg/dL — ABNORMAL HIGH (ref 70–99)
Potassium: 3.4 mEq/L — ABNORMAL LOW (ref 3.5–5.1)
Total Bilirubin: 0.2 mg/dL — ABNORMAL LOW (ref 0.3–1.2)

## 2011-12-08 LAB — LIPASE, BLOOD: Lipase: 51 U/L (ref 11–59)

## 2011-12-08 LAB — TROPONIN I: Troponin I: <0.3 ng/mL (ref <0.30)

## 2011-12-08 MED ORDER — FAMOTIDINE 20 MG PO TABS: 20.0000 mg | ORAL_TABLET | Freq: Two times a day (BID) | ORAL | Status: DC | PRN

## 2011-12-08 NOTE — ED Provider Notes (Addendum)
History     CSN: [id number]  Arrival date & time 12/08/11  0020   First MD Initiated Contact with Patient 12/08/11 0025      Chief Complaint  Patient presents with  . Chest Pain    (Consider location/radiation/quality/duration/timing/severity/associated sxs/prior treatment) Patient is a 66 y.o. female presenting with chest pain. The history is provided by the patient and the EMS personnel.  Chest Pain The chest pain began 1 - 2 hours ago. Duration of episode(s) is 15 minutes. Chest pain occurs constantly. The chest pain is resolved. Associated with: Nothing, the pain woke the patient from sleep. At its most intense, the pain is at 9/10. The pain is currently at 0/10. The severity of the pain is severe. The quality of the pain is described as pressure-like, burning and squeezing. Radiates to: Upper chest and esophagus. Exacerbated by: Nothing, resolved after belching up gastric contents into her mouth. Primary symptoms include shortness of breath. Pertinent negatives for primary symptoms include no fever, no fatigue, no syncope, no cough, no wheezing, no palpitations, no abdominal pain, no nausea, no vomiting, no dizziness and no altered mental status.  Pertinent negatives for associated symptoms include no claudication, no diaphoresis, no lower extremity edema, no near-syncope, no numbness, no orthopnea, no paroxysmal nocturnal dyspnea and no weakness. She tried aspirin for the symptoms.  Her past medical history is significant for diabetes.     Past Medical History  Diagnosis Date  . Hyperlipidemia   . Diabetes mellitus   . Anemia   . GERD (gastroesophageal reflux disease)   . Osteoporosis   . Depression   . COPD (chronic obstructive pulmonary disease)   . SOB (shortness of breath) on exertion     Past Surgical History  Procedure Date  . Vaginal hysterectomy 1975  . Laparoscopy 1975    ABD pain    No family history on file.  History  Substance Use Topics  . Smoking  status: Current Everyday Smoker -- 15 years    Types: Cigarettes  . Smokeless tobacco: Former Neurosurgeon    Types: Chew   Comment: 11/17/11 "1-2 cigs per day"  . Alcohol Use: Yes     "socially"    OB History    Grav Para Term Preterm Abortions TAB SAB Ect Mult Living                  Review of Systems  Constitutional: Positive for appetite change. Negative for fever, chills, diaphoresis and fatigue.  Respiratory: Positive for shortness of breath. Negative for cough and wheezing.   Cardiovascular: Positive for chest pain. Negative for palpitations, orthopnea, claudication, syncope and near-syncope.  Gastrointestinal: Negative for nausea, vomiting, abdominal pain, diarrhea, constipation, blood in stool and abdominal distention.  Genitourinary: Negative for urgency, frequency and hematuria.  Musculoskeletal: Negative for back pain.  Skin: Negative.   Neurological: Negative for dizziness, syncope, weakness, light-headedness and numbness.  Psychiatric/Behavioral: Negative.  Negative for altered mental status.    Allergies  Review of patient's allergies indicates no known allergies.  Home Medications   Current Outpatient Rx  Name Route Sig Dispense Refill  . ALBUTEROL SULFATE HFA 108 (90 BASE) MCG/ACT IN AERS Inhalation Inhale 2 puffs into the lungs every 6 (six) hours as needed for wheezing. 1 Inhaler 0  . ALBUTEROL SULFATE (2.5 MG/3ML) 0.083% IN NEBU Nebulization Take 2.5 mg by nebulization every 6 (six) hours as needed. Shortness of breath    . ALENDRONATE SODIUM 35 MG PO TABS Oral Take 35 mg  by mouth every 7 (seven) days. Take with a full glass of water on an empty stomach. Takes on Mondays    . BUDESONIDE-FORMOTEROL FUMARATE 160-4.5 MCG/ACT IN AERO  Take 2 puffs first thing in am and then another 2 puffs about 12 hours later.    1 Inhaler 12  . CALCIUM CARBONATE 600 MG PO TABS Oral Take 600 mg by mouth 2 (two) times daily with a meal.      . CLOZAPINE 100 MG PO TABS Oral Take 100 mg  by mouth. 1 in the morning, 1 in the afternoon, and 2 at night    . DONEPEZIL HCL 10 MG PO TABS Oral Take 10 mg by mouth at bedtime.      . FENOFIBRATE 160 MG PO TABS Oral Take 160 mg by mouth daily.      Marland Kitchen FIBER 5 GM/15ML PO LIQD Oral Take 15 mLs by mouth 2 (two) times daily.     Marland Kitchen METFORMIN HCL 500 MG PO TABS Oral Take 500 mg by mouth 2 (two) times daily with a meal.      . PANTOPRAZOLE SODIUM 40 MG PO TBEC Oral Take 1 tablet (40 mg total) by mouth daily at 12 noon. Take until completed 14 days 14 tablet 0  . PREDNISONE 50 MG PO TABS Oral Take 1 tablet (50 mg total) by mouth daily. 5 tablet 0  . SIMVASTATIN 40 MG PO TABS Oral Take 40 mg by mouth daily.     . THIOTHIXENE 5 MG PO CAPS Oral Take 5 mg by mouth at bedtime.      Marland Kitchen TIOTROPIUM BROMIDE MONOHYDRATE 18 MCG IN CAPS Inhalation Place 1 capsule (18 mcg total) into inhaler and inhale daily. New long term med 30 capsule 0  . VITAMIN D (CHOLECALCIFEROL) PO Oral Take 600 Units by mouth daily.        BP 143/57  Pulse 103  Temp(Src) 97.8 F (36.6 C) (Oral)  Resp 20  SpO2 98%  Physical Exam  Nursing note and vitals reviewed. Constitutional: She is oriented to person, place, and time. She appears well-developed and well-nourished. No distress.  HENT:  Head: Normocephalic and atraumatic.  Mouth/Throat: Oropharynx is clear and moist.  Eyes: EOM are normal. Pupils are equal, round, and reactive to light.  Neck: Normal range of motion. Neck supple. No JVD present. No tracheal deviation present.  Cardiovascular: Normal rate, regular rhythm, normal heart sounds and intact distal pulses.   No extrasystoles are present. Exam reveals no gallop, no S3, no S4 and no friction rub.   No murmur heard. Pulmonary/Chest: Breath sounds normal. No accessory muscle usage or stridor. Not tachypneic. No respiratory distress. She has no wheezes. She has no rales. She exhibits no tenderness.  Abdominal: Soft. Bowel sounds are normal. She exhibits no distension.  There is no tenderness.  Musculoskeletal: Normal range of motion. She exhibits no edema and no tenderness.  Neurological: She is alert and oriented to person, place, and time. She has normal reflexes. No cranial nerve deficit. Coordination normal.  Skin: Skin is warm and dry. No rash noted. She is not diaphoretic. No erythema. No pallor.  Psychiatric: She has a normal mood and affect. Her behavior is normal. Judgment and thought content normal.    ED Course  Procedures (including critical care time)   Date: 12/08/2011  Rate: 103  Rhythm: sinus tachycardia  QRS Axis: left  Intervals: normal  ST/T Wave abnormalities: nonspecific ST/T changes  Conduction Disutrbances:left anterior fascicular block  Narrative Interpretation: non-provocative ecg  Old EKG Reviewed: no significant changes seen    Labs Reviewed  CBC  DIFFERENTIAL  COMPREHENSIVE METABOLIC PANEL  LIPASE, BLOOD  TROPONIN I  CK TOTAL AND CKMB   Dg Chest 2 View  12/08/2011  *RADIOLOGY REPORT*  Clinical Data: Chest and epigastric pain.  CHEST - 2 VIEW  Comparison: Chest radiograph performed 11/20/2011  Findings: The lungs are well-aerated and clear.  There is no evidence of focal opacification, pleural effusion or pneumothorax.  The heart is normal in size; the mediastinal contour is within normal limits.  No acute osseous abnormalities are seen.  Mild calcification is noted along the proximal abdominal aorta.  IMPRESSION:  1.  No acute cardiopulmonary process seen. 2.  Mild calcification along the proximal abdominal aorta.  Original Report Authenticated By: Tonia Ghent, M.D.     No diagnosis found.    MDM  ACS, MI, Musculoskeletal chest pain, costochondritis, GERD, Gastrointestinal Chest Pain, Pleuritic Chest Pain, Pneumonia, Pneumothorax, Pulmonary Embolism, Esophageal Spasm, Arrhythmia considered among other potential etiologies in the patient's differential diagnosis. Gastric reflux is favored as the most likely  diagnosis based on symptoms, lack of cardiac history, and normal physical exam and EKG.  4:06 AM Serial troponin negative for myocardial injury or ischemia. At this time the patient's symptoms have resolved and I perceive them to be due to gastric reflux. I will discharge the patient home for followup as an outpatient as needed if symptoms recur. The patient states her understanding of and agreement with the plan of care.       Felisa Bonier, MD 12/08/11 1191  Felisa Bonier, MD 12/08/11 [phone]

## 2011-12-08 NOTE — ED Notes (Signed)
App 3 hours ago pt awoke with epigastric pain, and burning. States pain improved with sitting up and burping. EMS states upon their arrival pt was ambulatory and denied pain at that time, wanted to get checked out. Pt took Aspirin 324 mg at home prior to EMS arrival

## 2011-12-08 NOTE — ED Notes (Signed)
App 3 hours ago pt was sleeping awoke with epigastric burning sensation constant in nature, relieved slightly with sitting up and burping. EMS arrived stated pain had resolved, pt was ambulatory

## 2011-12-15 DIAGNOSIS — Z79899 Other long term (current) drug therapy: Secondary | ICD-10-CM | POA: Diagnosis not present

## 2011-12-19 DIAGNOSIS — F200 Paranoid schizophrenia: Secondary | ICD-10-CM | POA: Diagnosis not present

## 2012-01-04 DIAGNOSIS — E119 Type 2 diabetes mellitus without complications: Secondary | ICD-10-CM | POA: Diagnosis not present

## 2012-01-04 DIAGNOSIS — J209 Acute bronchitis, unspecified: Secondary | ICD-10-CM | POA: Diagnosis not present

## 2012-01-12 DIAGNOSIS — Z79899 Other long term (current) drug therapy: Secondary | ICD-10-CM | POA: Diagnosis not present

## 2012-01-19 DIAGNOSIS — Z79899 Other long term (current) drug therapy: Secondary | ICD-10-CM | POA: Diagnosis not present

## 2012-01-23 DIAGNOSIS — J309 Allergic rhinitis, unspecified: Secondary | ICD-10-CM | POA: Diagnosis not present

## 2012-02-13 DIAGNOSIS — Z79899 Other long term (current) drug therapy: Secondary | ICD-10-CM | POA: Diagnosis not present

## 2012-02-16 ENCOUNTER — Telehealth: Payer: Self-pay | Admitting: Family Medicine

## 2012-02-16 NOTE — Telephone Encounter (Signed)
[facility] is calling about a Certificate of Medical Necessity that she said she spoke to [place] about at the end of March.  She had faxed it over for Dr. Deirdre Priest and is waiting to get it back.

## 2012-03-12 DIAGNOSIS — E119 Type 2 diabetes mellitus without complications: Secondary | ICD-10-CM | POA: Diagnosis not present

## 2012-03-12 DIAGNOSIS — I10 Essential (primary) hypertension: Secondary | ICD-10-CM | POA: Diagnosis not present

## 2012-03-12 DIAGNOSIS — E785 Hyperlipidemia, unspecified: Secondary | ICD-10-CM | POA: Diagnosis not present

## 2012-03-13 DIAGNOSIS — F200 Paranoid schizophrenia: Secondary | ICD-10-CM | POA: Diagnosis not present

## 2012-03-19 DIAGNOSIS — Z79899 Other long term (current) drug therapy: Secondary | ICD-10-CM | POA: Diagnosis not present

## 2012-04-16 DIAGNOSIS — Z79899 Other long term (current) drug therapy: Secondary | ICD-10-CM | POA: Diagnosis not present

## 2012-05-14 ENCOUNTER — Other Ambulatory Visit (HOSPITAL_COMMUNITY): Payer: Self-pay | Admitting: Family Medicine

## 2012-05-14 DIAGNOSIS — Z1231 Encounter for screening mammogram for malignant neoplasm of breast: Secondary | ICD-10-CM

## 2012-05-14 DIAGNOSIS — Z79899 Other long term (current) drug therapy: Secondary | ICD-10-CM | POA: Diagnosis not present

## 2012-06-05 DIAGNOSIS — F200 Paranoid schizophrenia: Secondary | ICD-10-CM | POA: Diagnosis not present

## 2012-06-13 DIAGNOSIS — Z79899 Other long term (current) drug therapy: Secondary | ICD-10-CM | POA: Diagnosis not present

## 2012-06-14 DIAGNOSIS — IMO0001 Reserved for inherently not codable concepts without codable children: Secondary | ICD-10-CM | POA: Diagnosis not present

## 2012-06-25 ENCOUNTER — Ambulatory Visit (HOSPITAL_COMMUNITY)
Admission: RE | Admit: 2012-06-25 | Discharge: 2012-06-25 | Disposition: A | Discharge status: Home / Self Care | Payer: Medicare Other | Source: Ambulatory Visit | Attending: Family Medicine | Admitting: Family Medicine

## 2012-06-25 DIAGNOSIS — Z1231 Encounter for screening mammogram for malignant neoplasm of breast: Secondary | ICD-10-CM

## 2012-06-27 ENCOUNTER — Telehealth: Payer: Self-pay | Admitting: Internal Medicine

## 2012-06-27 NOTE — Telephone Encounter (Signed)
I spoke with pt and she wanted to know if she still needed to use her oxygen. When she was in the hospital back in January she was told to f/u with WM but pt thought she already did. I advised her MW has not seen her since nov 2012. Pt is scheduled to come in and see MW on 06/29/12 at 3:00. Nothing further was needed

## 2012-06-29 ENCOUNTER — Encounter: Payer: Self-pay | Admitting: Internal Medicine

## 2012-06-29 ENCOUNTER — Ambulatory Visit (INDEPENDENT_AMBULATORY_CARE_PROVIDER_SITE_OTHER): Payer: Medicare Other | Admitting: Internal Medicine

## 2012-06-29 VITALS — BP 118/68 | HR 97 | Temp 98.0°F | Ht 62.0 in | Wt 163.4 lb

## 2012-06-29 DIAGNOSIS — J45909 Unspecified asthma, uncomplicated: Secondary | ICD-10-CM

## 2012-06-29 DIAGNOSIS — F17200 Nicotine dependence, unspecified, uncomplicated: Secondary | ICD-10-CM

## 2012-06-29 NOTE — Progress Notes (Signed)
Subjective:     Patient ID: Elizabeth Schwartz, female   DOB: [birth year]   MRN: [id number]  HPI  42 yowf smoker uses [facility] for primary care lives in assisted living since 2004 ? Why, she's not sure,  seen in ER for sob and referred to pulmonary clinic 09/08/2011 with minimal airflow obst on pfts  09/08/2011 Initial pulmonary office eval  Cc sob x sev years indolent onset progressive decline to point where it's difficult to do grocery shopping with first serious exac x one month much more sob s sign cough or dysphagia itching sneezing >to er where got 3 nebs and better at ov c no meds at all x over a week. Imp was asthma, not copd rec Start symbicort 160  Take 2 puffs first thing in am and then another 2 puffs about 12 hours later.  Work on inhaler technique    10/11/2011 f/u ov/ still smoking one or two a day cc breathing better but still  using saba 2-3 daytime only c/o sob flat and mod pace x one half block.  No sign cough now. hfa still very poor. rec I strongly recommend if you need to be treated with fosfamax longterm that you reclast IV yearly instead since this has no adverse effects on the airways. Stop smoking completely - it's the most important aspect of your care.  Work on [employer].    Only use your albuterol as a rescue medication F/u prn   06/29/2012 f/u ov/ still smoking  cc "I want 02 off" only uses sometimes at bedtime  No unusual cough, purulent sputum or sinus/hb symptoms on present rx. Uses saba hs once a week, maybe once a week, does not have neb, still using fosfamax.    doe x half a block no variability, using rescue inhaler on day of ov  rec I strongly recommend if you need to be treated with fosfamax longterm that you reclast IV yearly instead since this has no adverse effects on the airways.  Stop smoking completely - it's the most important aspect of your care.   Work on inhaler technique:  .     Only use your albuterol as a rescue  medication   06/29/12 ov/  still smoking cc need primary doctor since [facility] closed. No limiting sob,  No unusual cough, purulent sputum or sinus/hb symptoms on present rx.    Sleeping ok without nocturnal  or early am exacerbation  of respiratory  c/o's or need for noct saba. Also denies any obvious fluctuation of symptoms with weather or environmental changes or other aggravating or alleviating factors except as outlined above   ROS  At present neg for  any significant sore throat, dysphagia, itching, sneezing,  nasal congestion or excess/ purulent secretions,  fever, chills, sweats, unintended wt loss, pleuritic or exertional cp, hempoptysis, orthopnea pnd or leg swelling.  Also denies presyncope, palpitations, heartburn, abdominal pain, nausea, vomiting, diarrhea  or change in bowel or urinary habits, dysuria,hematuria,  rash, arthralgias, visual complaints, headache, numbness weakness or ataxia.      Past Medical Hx Diabetes Hyperlipidemia Osteoporosis  Psych ? schizoprhenia on phenothiazines           Objective:  Physical Exam  amb wf unusual affect, resting tremor   wt 166 09/08/2011 > 10/11/2011  168 > 06/29/2012  163  HEENT mild turbinate edema.  Oropharynx no thrush or excess pnd or cobblestoning.  No JVD or cervical adenopathy. Mild accessory muscle hypertrophy. Trachea midline, nl  thryroid. Chest was hyperinflated by percussion with diminished breath sounds and moderate increased exp time without wheeze. Hoover sign positive at mid inspiration. Regular rate and rhythm without murmur gallop or rub or increase P2 or edema.  Abd: no hsm, nl excursion. Ext warm without cyanosis or clubbing.    cxr 08/29/11  No evidence of active pulmonary disease.   Assessment:         Plan:

## 2012-06-29 NOTE — Patient Instructions (Addendum)
I strongly recommend if you need to be treated with fosfamax longterm that you reclast IV yearly instead since this has no adverse effects on the airways.  Stop smoking completely - it's the most important aspect of your care - if you quit smoking we could eliminate some of your medications and probably the oxygen too.  Work on inhaler technique:  relax and gently blow all the way out then take a nice smooth deep breath back in, triggering the inhaler at same time you start breathing in.  Hold for up to 5 seconds if you can.  Rinse and gargle with water when done   If your mouth or throat starts to bother you,   I suggest you time the inhaler to your dental care and after using the inhaler(s) brush teeth and tongue with a baking soda containing toothpaste and when you rinse this out, gargle with it first to see if this helps your mouth and throat.     Only use your albuterol as a rescue medication to be used if you can't catch your breath by resting or doing a relaxed purse lip breathing pattern. The less you use it, the better it will work when you need it.    If you are satisfied with your treatment plan let your doctor know and he/she can either refill your medications or you can return here when your prescription runs out.     If in any way you are not 100% satisfied,  please tell us.  If 100% better, tell your friends!

## 2012-06-30 NOTE — Assessment & Plan Note (Signed)
-   hfa 25% 09/08/2011 >  50% 06/29/12   - PFTs 10/11/2011 FEV1  1.76 (86%) ratio 77 and no better with B2,  DLC0 66 corrects 111   - 10/11/2011  Walked RA x 3 laps @ 185 ft each stopped due to  End of study, no desats  She does not have copd and not even sure at this point how much asthma she has - most likely if she quit smoking would also not need symbicort but certainly does not need maint rx with spiriva at this point  Smoking discussed separately

## 2012-06-30 NOTE — Assessment & Plan Note (Signed)
I took an extended  opportunity with this patient to outline the consequences of continued cigarette use  in airway disorders based on all the data we have from the multiple national lung health studies (perfomed over decades at millions of dollars in cost)  indicating that smoking cessation, not choice of inhalers or physicians, is the most important aspect of care.    Therefore no need to f/u in this office after establishes with new primary care doctor

## 2012-07-02 ENCOUNTER — Telehealth: Payer: Self-pay | Admitting: Internal Medicine

## 2012-07-02 NOTE — Telephone Encounter (Signed)
Its the spiriva I want her to stop now, sorry if this wasn't clear (please strike it from [facility])  The symbicort will likley be needed as long as she smokes. After she's stopped smoking, if she wants to work on symbicort taper off, she'll need to return for a more elaborate/ specific plan (which I didn't give her at ov and don't plan to attempt in a written format x as instructions given at the end of ov

## 2012-07-02 NOTE — Telephone Encounter (Signed)
MW, please advise.  

## 2012-07-03 NOTE — Telephone Encounter (Signed)
Called spoke with Talbert Forest, advised of MW's recs as documented.  Talbert Forest verbalized her understanding and requested that this phone note be faxed to [phone] attn [employer] research associate).    MAR has been updated.  Nothing further needed; will sign off.

## 2012-07-10 DIAGNOSIS — Z79899 Other long term (current) drug therapy: Secondary | ICD-10-CM | POA: Diagnosis not present

## 2012-07-17 DIAGNOSIS — F200 Paranoid schizophrenia: Secondary | ICD-10-CM | POA: Diagnosis not present

## 2012-07-19 DIAGNOSIS — J449 Chronic obstructive pulmonary disease, unspecified: Secondary | ICD-10-CM | POA: Diagnosis not present

## 2012-07-19 DIAGNOSIS — K219 Gastro-esophageal reflux disease without esophagitis: Secondary | ICD-10-CM | POA: Diagnosis not present

## 2012-07-19 DIAGNOSIS — IMO0001 Reserved for inherently not codable concepts without codable children: Secondary | ICD-10-CM | POA: Diagnosis not present

## 2012-07-19 DIAGNOSIS — E785 Hyperlipidemia, unspecified: Secondary | ICD-10-CM | POA: Diagnosis not present

## 2012-08-07 DIAGNOSIS — Z79899 Other long term (current) drug therapy: Secondary | ICD-10-CM | POA: Diagnosis not present

## 2012-08-09 DIAGNOSIS — J209 Acute bronchitis, unspecified: Secondary | ICD-10-CM | POA: Diagnosis not present

## 2012-08-09 DIAGNOSIS — E119 Type 2 diabetes mellitus without complications: Secondary | ICD-10-CM | POA: Diagnosis not present

## 2012-08-09 DIAGNOSIS — J449 Chronic obstructive pulmonary disease, unspecified: Secondary | ICD-10-CM | POA: Diagnosis not present

## 2012-08-09 DIAGNOSIS — J019 Acute sinusitis, unspecified: Secondary | ICD-10-CM | POA: Diagnosis not present

## 2012-08-13 DIAGNOSIS — Z79899 Other long term (current) drug therapy: Secondary | ICD-10-CM | POA: Diagnosis not present

## 2012-08-17 DIAGNOSIS — M79609 Pain in unspecified limb: Secondary | ICD-10-CM | POA: Diagnosis not present

## 2012-08-17 DIAGNOSIS — M779 Enthesopathy, unspecified: Secondary | ICD-10-CM | POA: Diagnosis not present

## 2012-08-17 DIAGNOSIS — M2010 Hallux valgus (acquired), unspecified foot: Secondary | ICD-10-CM | POA: Diagnosis not present

## 2012-08-17 DIAGNOSIS — M2040 Other hammer toe(s) (acquired), unspecified foot: Secondary | ICD-10-CM | POA: Diagnosis not present

## 2012-08-23 DIAGNOSIS — E119 Type 2 diabetes mellitus without complications: Secondary | ICD-10-CM | POA: Diagnosis not present

## 2012-08-23 DIAGNOSIS — J069 Acute upper respiratory infection, unspecified: Secondary | ICD-10-CM | POA: Diagnosis not present

## 2012-08-23 DIAGNOSIS — F2089 Other schizophrenia: Secondary | ICD-10-CM | POA: Diagnosis not present

## 2012-08-23 DIAGNOSIS — J449 Chronic obstructive pulmonary disease, unspecified: Secondary | ICD-10-CM | POA: Diagnosis not present

## 2012-08-28 ENCOUNTER — Telehealth: Payer: Self-pay | Admitting: Internal Medicine

## 2012-08-28 DIAGNOSIS — J45909 Unspecified asthma, uncomplicated: Secondary | ICD-10-CM

## 2012-08-28 DIAGNOSIS — R0600 Dyspnea, unspecified: Secondary | ICD-10-CM

## 2012-08-28 NOTE — Telephone Encounter (Signed)
Needs ono RA then we'll call with recs for nocturnal use. No longer qualifies for amb 02

## 2012-08-28 NOTE — Telephone Encounter (Signed)
Pt advised and order placed.  , CMA  

## 2012-08-28 NOTE — Telephone Encounter (Signed)
Spoke with pt. She states needs to be recertified for o2. Last visit was using and her sats were fine on RA. She states only uses o2 at night "sometimes" and during the day if she needs it which is not on a daily basis. She was unsure if MW ordered o2, but it was started when she was in the hospital. Do you want to bring her back for ov to re-cert or have her DME do ONO? Please advise, thanks!

## 2012-08-30 DIAGNOSIS — Z79899 Other long term (current) drug therapy: Secondary | ICD-10-CM | POA: Diagnosis not present

## 2012-09-03 ENCOUNTER — Encounter: Payer: Self-pay | Admitting: Internal Medicine

## 2012-09-05 DIAGNOSIS — K219 Gastro-esophageal reflux disease without esophagitis: Secondary | ICD-10-CM | POA: Diagnosis not present

## 2012-09-05 DIAGNOSIS — E785 Hyperlipidemia, unspecified: Secondary | ICD-10-CM | POA: Diagnosis not present

## 2012-09-05 DIAGNOSIS — E119 Type 2 diabetes mellitus without complications: Secondary | ICD-10-CM | POA: Diagnosis not present

## 2012-09-05 DIAGNOSIS — J3089 Other allergic rhinitis: Secondary | ICD-10-CM | POA: Diagnosis not present

## 2012-09-06 DIAGNOSIS — Z79899 Other long term (current) drug therapy: Secondary | ICD-10-CM | POA: Diagnosis not present

## 2012-09-11 DIAGNOSIS — F200 Paranoid schizophrenia: Secondary | ICD-10-CM | POA: Diagnosis not present

## 2012-09-13 DIAGNOSIS — Z23 Encounter for immunization: Secondary | ICD-10-CM | POA: Diagnosis not present

## 2012-09-14 DIAGNOSIS — Z79899 Other long term (current) drug therapy: Secondary | ICD-10-CM | POA: Diagnosis not present

## 2012-09-25 DIAGNOSIS — J3089 Other allergic rhinitis: Secondary | ICD-10-CM | POA: Diagnosis not present

## 2012-09-25 DIAGNOSIS — E119 Type 2 diabetes mellitus without complications: Secondary | ICD-10-CM | POA: Diagnosis not present

## 2012-09-25 DIAGNOSIS — J449 Chronic obstructive pulmonary disease, unspecified: Secondary | ICD-10-CM | POA: Diagnosis not present

## 2012-10-05 DIAGNOSIS — Z79899 Other long term (current) drug therapy: Secondary | ICD-10-CM | POA: Diagnosis not present

## 2012-10-16 DIAGNOSIS — Z79899 Other long term (current) drug therapy: Secondary | ICD-10-CM | POA: Diagnosis not present

## 2012-11-13 DIAGNOSIS — F200 Paranoid schizophrenia: Secondary | ICD-10-CM | POA: Diagnosis not present

## 2012-11-16 DIAGNOSIS — Z79899 Other long term (current) drug therapy: Secondary | ICD-10-CM | POA: Diagnosis not present

## 2012-11-22 DIAGNOSIS — E119 Type 2 diabetes mellitus without complications: Secondary | ICD-10-CM | POA: Diagnosis not present

## 2012-11-22 DIAGNOSIS — E785 Hyperlipidemia, unspecified: Secondary | ICD-10-CM | POA: Diagnosis not present

## 2012-11-22 DIAGNOSIS — K219 Gastro-esophageal reflux disease without esophagitis: Secondary | ICD-10-CM | POA: Diagnosis not present

## 2012-11-22 DIAGNOSIS — J449 Chronic obstructive pulmonary disease, unspecified: Secondary | ICD-10-CM | POA: Diagnosis not present

## 2012-12-04 ENCOUNTER — Emergency Department (HOSPITAL_COMMUNITY): Payer: Medicare Other

## 2012-12-04 ENCOUNTER — Encounter (HOSPITAL_COMMUNITY): Payer: Self-pay | Admitting: *Deleted

## 2012-12-04 ENCOUNTER — Emergency Department (HOSPITAL_COMMUNITY)
Admission: EM | Admit: 2012-12-04 | Discharge: 2012-12-04 | Disposition: A | Discharge status: Home / Self Care | Payer: Medicare Other | Attending: Emergency Medicine | Admitting: Emergency Medicine

## 2012-12-04 DIAGNOSIS — F329 Major depressive disorder, single episode, unspecified: Secondary | ICD-10-CM | POA: Insufficient documentation

## 2012-12-04 DIAGNOSIS — E119 Type 2 diabetes mellitus without complications: Secondary | ICD-10-CM | POA: Insufficient documentation

## 2012-12-04 DIAGNOSIS — Z79899 Other long term (current) drug therapy: Secondary | ICD-10-CM | POA: Diagnosis not present

## 2012-12-04 DIAGNOSIS — Z862 Personal history of diseases of the blood and blood-forming organs and certain disorders involving the immune mechanism: Secondary | ICD-10-CM | POA: Diagnosis not present

## 2012-12-04 DIAGNOSIS — S42309A Unspecified fracture of shaft of humerus, unspecified arm, initial encounter for closed fracture: Secondary | ICD-10-CM | POA: Diagnosis not present

## 2012-12-04 DIAGNOSIS — Z8739 Personal history of other diseases of the musculoskeletal system and connective tissue: Secondary | ICD-10-CM | POA: Diagnosis not present

## 2012-12-04 DIAGNOSIS — M79609 Pain in unspecified limb: Secondary | ICD-10-CM | POA: Diagnosis not present

## 2012-12-04 DIAGNOSIS — Y929 Unspecified place or not applicable: Secondary | ICD-10-CM | POA: Insufficient documentation

## 2012-12-04 DIAGNOSIS — E785 Hyperlipidemia, unspecified: Secondary | ICD-10-CM | POA: Diagnosis not present

## 2012-12-04 DIAGNOSIS — Z87891 Personal history of nicotine dependence: Secondary | ICD-10-CM | POA: Diagnosis not present

## 2012-12-04 DIAGNOSIS — K219 Gastro-esophageal reflux disease without esophagitis: Secondary | ICD-10-CM | POA: Insufficient documentation

## 2012-12-04 DIAGNOSIS — F3289 Other specified depressive episodes: Secondary | ICD-10-CM | POA: Insufficient documentation

## 2012-12-04 DIAGNOSIS — R52 Pain, unspecified: Secondary | ICD-10-CM | POA: Diagnosis not present

## 2012-12-04 DIAGNOSIS — S4980XA Other specified injuries of shoulder and upper arm, unspecified arm, initial encounter: Secondary | ICD-10-CM | POA: Diagnosis not present

## 2012-12-04 DIAGNOSIS — J4489 Other specified chronic obstructive pulmonary disease: Secondary | ICD-10-CM | POA: Insufficient documentation

## 2012-12-04 DIAGNOSIS — I10 Essential (primary) hypertension: Secondary | ICD-10-CM | POA: Diagnosis not present

## 2012-12-04 DIAGNOSIS — Y9389 Activity, other specified: Secondary | ICD-10-CM | POA: Insufficient documentation

## 2012-12-04 DIAGNOSIS — J449 Chronic obstructive pulmonary disease, unspecified: Secondary | ICD-10-CM | POA: Diagnosis not present

## 2012-12-04 DIAGNOSIS — F209 Schizophrenia, unspecified: Secondary | ICD-10-CM | POA: Diagnosis not present

## 2012-12-04 DIAGNOSIS — F200 Paranoid schizophrenia: Secondary | ICD-10-CM | POA: Diagnosis not present

## 2012-12-04 DIAGNOSIS — M25519 Pain in unspecified shoulder: Secondary | ICD-10-CM | POA: Diagnosis not present

## 2012-12-04 DIAGNOSIS — R296 Repeated falls: Secondary | ICD-10-CM | POA: Insufficient documentation

## 2012-12-04 HISTORY — DX: Schizoaffective disorder, unspecified: F25.9

## 2012-12-04 HISTORY — DX: Essential (primary) hypertension: I10

## 2012-12-04 HISTORY — DX: Schizoaffective disorder, bipolar type: F25.0

## 2012-12-04 MED ORDER — OXYCODONE-ACETAMINOPHEN 5-325 MG PO TABS
1.0000 | ORAL_TABLET | Freq: Once | ORAL | Status: AC
  Administered 2012-12-04: 1 via ORAL
  Filled 2012-12-04: qty 1

## 2012-12-04 MED ORDER — HYDROCODONE-ACETAMINOPHEN 5-325 MG PO TABS: 1.0000 | ORAL_TABLET | Freq: Three times a day (TID) | ORAL | Status: DC | PRN

## 2012-12-04 NOTE — ED Notes (Signed)
HYQ:[id number]<IO> Expected date:<BR> Expected time:<BR> Means of arrival:<BR> Comments:<BR> Shoulder injury, IV established, pain meds given

## 2012-12-04 NOTE — ED Notes (Signed)
Pt states she was walking into [facility] when she tripped over her feet and landed on concrete, denies hitting head or LOC, states landed on L shoulder, also catching self w/ R hand and hitting face on concrete. Small abrasions noted to R hand, nose and chin. Cleaned abrasions on hand, nose and chin w/ normal saline. Pt alert and oriented. L shoulder pain 5/10.

## 2012-12-04 NOTE — ED Notes (Signed)
Per EMS pt was going to appointment at behavioral health, tripped walking up stairs, fell forward, catching herself w/ R hand, small abrasion to hand, nose and chin, also caught self w/ L arm, complaining of pain in L shoulder area, placed in sling by staff at [facility], no deformity noted, relief by placing in sling. Lives in group home. 100 mcg fentanyl given to 20G R hand. BP 139/79, HR 106, RR 22, 99% RA. CBG 130

## 2012-12-04 NOTE — ED Provider Notes (Signed)
History     CSN: [id number]  Arrival date & time 12/04/12  4098   First MD Initiated Contact with Patient 12/04/12 1006      No chief complaint on file.   (Consider location/radiation/quality/duration/timing/severity/associated sxs/prior treatment) HPI The patient comes immediately after a fall with left shoulder pain.  She describes a mechanical event, with falling against her left shoulder, and onto her face.  There is no loss of consciousness, and since the event she has been ambulatory, with minimal pain anywhere except the left shoulder.  No malocclusion, no dyspnea, no confusion or disorientation. The shoulder pain is minimally improved with OTC medication, worse with any motion.  There is no pain in the elbow or wrist until the patient moves either joint, and there is referred pain in the shoulder.  The pain is severe Past Medical History  Diagnosis Date  . Hyperlipidemia   . Diabetes mellitus   . Anemia   . GERD (gastroesophageal reflux disease)   . Osteoporosis   . Depression   . COPD (chronic obstructive pulmonary disease)   . SOB (shortness of breath) on exertion   . Hypertension   . Schizophrenia, schizo-affective     Past Surgical History  Procedure Date  . Vaginal hysterectomy 1975  . Laparoscopy 1975    ABD pain    No family history on file.  History  Substance Use Topics  . Smoking status: Former Smoker -- 15 years    Types: Cigarettes    Quit date: 02/28/2012  . Smokeless tobacco: Former Neurosurgeon    Types: Chew  . Alcohol Use: Yes     Comment: "socially"    OB History    Grav Para Term Preterm Abortions TAB SAB Ect Mult Living                  Review of Systems  Constitutional:       Per HPI, otherwise negative  HENT:       Per HPI, otherwise negative  Eyes: Negative.   Respiratory:       Per HPI, otherwise negative  Cardiovascular:       Per HPI, otherwise negative  Gastrointestinal: Negative for vomiting.  Genitourinary: Negative.     Musculoskeletal:       Per HPI, otherwise negative  Skin: Negative.   Neurological: Negative for syncope.    Allergies  Review of patient's allergies indicates no known allergies.  Home Medications   Current Outpatient Rx  Name  Route  Sig  Dispense  Refill  . OMEGA-3 FATTY ACIDS 1000 MG PO CAPS   Oral   Take 1 g by mouth every morning.         Marland Kitchen METFORMIN HCL 500 MG PO TABS   Oral   Take 500 mg by mouth 2 (two) times daily with a meal.           . VITAMIN C 500 MG PO TABS   Oral   Take 500 mg by mouth every morning.         Marland Kitchen VITAMIN D (CHOLECALCIFEROL) PO   Oral   Take 600 Units by mouth daily.           . ALBUTEROL SULFATE HFA 108 (90 BASE) MCG/ACT IN AERS   Inhalation   Inhale 2 puffs into the lungs every 6 (six) hours as needed for wheezing.   1 Inhaler   0   . ALENDRONATE SODIUM 35 MG PO TABS   Oral  Take 35 mg by mouth every 7 (seven) days. Take with a full glass of water on an empty stomach. Takes on Mondays         . BUDESONIDE-FORMOTEROL FUMARATE 160-4.5 MCG/ACT IN AERO      Take 2 puffs first thing in am and then another 2 puffs about 12 hours later.      1 Inhaler   12   . CALCIUM CARBONATE 600 MG PO TABS   Oral   Take 600 mg by mouth 2 (two) times daily with a meal.           . CLOZAPINE 100 MG PO TABS   Oral   Take 100-200 mg by mouth 2 (two) times daily. 1 in the morning, and 2 at night         . DONEPEZIL HCL 10 MG PO TABS   Oral   Take 10 mg by mouth at bedtime.           Marland Kitchen FAMOTIDINE 20 MG PO TABS   Oral   Take 1 tablet (20 mg total) by mouth 2 (two) times daily as needed for heartburn (indigestion).   14 tablet   0   . FENOFIBRATE 160 MG PO TABS   Oral   Take 160 mg by mouth daily.           Marland Kitchen FIBER 5 GM/15ML PO LIQD   Oral   Take 15 mLs by mouth 2 (two) times daily.          Marland Kitchen PANTOPRAZOLE SODIUM 40 MG PO TBEC   Oral   Take 1 tablet (40 mg total) by mouth daily at 12 noon. Take until completed 14  days   14 tablet   0   . PREDNISONE 50 MG PO TABS   Oral   Take 1 tablet (50 mg total) by mouth daily.   5 tablet   0   . SIMVASTATIN 40 MG PO TABS   Oral   Take 40 mg by mouth daily.          . THIOTHIXENE 5 MG PO CAPS   Oral   Take 5 mg by mouth at bedtime.             BP 128/76  Pulse 103  Temp 98.4 F (36.9 C) (Oral)  Resp 16  SpO2 99%  Physical Exam  Nursing note and vitals reviewed. Constitutional: She is oriented to person, place, and time. She appears well-developed and well-nourished. No distress.  HENT:  Head: Normocephalic. Head is with abrasion.    Nose: No nose lacerations. No epistaxis.  Mouth/Throat: Uvula is midline, oropharynx is clear and moist and mucous membranes are normal. Normal dentition. No lacerations.  Eyes: Conjunctivae normal and EOM are normal.  Neck: No spinous process tenderness and no muscular tenderness present. No erythema and normal range of motion present.  Cardiovascular: Regular rhythm.  Tachycardia present.   Pulmonary/Chest: Effort normal and breath sounds normal. No stridor. No respiratory distress.  Abdominal: She exhibits no distension.  Musculoskeletal: She exhibits no edema.       Left shoulder: She exhibits decreased range of motion, tenderness, bony tenderness and swelling. She exhibits no effusion and no crepitus.       Left elbow: Normal.       Left wrist: Normal.  Neurological: She is alert and oriented to person, place, and time. No cranial nerve deficit.  Skin: Skin is warm and dry.  Psychiatric: She has a normal  mood and affect.    ED Course  ORTHOPEDIC INJURY TREATMENT Date/Time: 12/04/2012 11:53 AM Performed by: Gerhard Munch Authorized by: Gerhard Munch Consent: Verbal consent obtained. Risks and benefits: risks, benefits and alternatives were discussed Consent given by: patient Patient identity confirmed: verbally with patient Time out: Immediately prior to procedure a "time out" was called  to verify the correct patient, procedure, equipment, support staff and site/side marked as required. Injury location: upper arm Location details: left upper arm Injury type: fracture Fracture type: humeral shaft Pre-procedure neurovascular assessment: neurovascularly intact Pre-procedure distal perfusion: normal Pre-procedure neurological function: normal Pre-procedure range of motion: reduced Local anesthesia used: no Patient sedated: no Manipulation performed: no Immobilization: sling Post-procedure neurovascular assessment: post-procedure neurovascularly intact Post-procedure distal perfusion: normal Post-procedure neurological function: normal Post-procedure range of motion: unchanged Patient tolerance: Patient tolerated the procedure well with no immediate complications.   (including critical care time)  Labs Reviewed - No data to display No results found.   No diagnosis found.  I demonstrated the XR to the patient, and interpreted it myself (abnormal - fx of humerus).  MDM  Patient presents after a mechanical fall with pain persistently in her shoulder.  She is distally neurologically intact, and has no other focal complaints.  X-ray, and physical exam are consistent with a fractured proximal humerus.  The patient is in a sling, discharged in stable condition with orthopedics followup.  Gerhard Munch, MD 12/04/12 1154

## 2012-12-06 DIAGNOSIS — S42209A Unspecified fracture of upper end of unspecified humerus, initial encounter for closed fracture: Secondary | ICD-10-CM | POA: Diagnosis not present

## 2012-12-13 DIAGNOSIS — Z79899 Other long term (current) drug therapy: Secondary | ICD-10-CM | POA: Diagnosis not present

## 2012-12-14 DIAGNOSIS — S42209A Unspecified fracture of upper end of unspecified humerus, initial encounter for closed fracture: Secondary | ICD-10-CM | POA: Diagnosis not present

## 2012-12-31 DIAGNOSIS — S42209A Unspecified fracture of upper end of unspecified humerus, initial encounter for closed fracture: Secondary | ICD-10-CM | POA: Diagnosis not present

## 2013-01-03 DIAGNOSIS — Z79899 Other long term (current) drug therapy: Secondary | ICD-10-CM | POA: Diagnosis not present

## 2013-01-16 DIAGNOSIS — Z79899 Other long term (current) drug therapy: Secondary | ICD-10-CM | POA: Diagnosis not present

## 2013-01-23 DIAGNOSIS — S42209A Unspecified fracture of upper end of unspecified humerus, initial encounter for closed fracture: Secondary | ICD-10-CM | POA: Diagnosis not present

## 2013-01-28 ENCOUNTER — Other Ambulatory Visit: Payer: Self-pay | Admitting: *Deleted

## 2013-02-01 DIAGNOSIS — S42209A Unspecified fracture of upper end of unspecified humerus, initial encounter for closed fracture: Secondary | ICD-10-CM | POA: Diagnosis not present

## 2013-02-05 DIAGNOSIS — S42209A Unspecified fracture of upper end of unspecified humerus, initial encounter for closed fracture: Secondary | ICD-10-CM | POA: Diagnosis not present

## 2013-02-06 DIAGNOSIS — Z79899 Other long term (current) drug therapy: Secondary | ICD-10-CM | POA: Diagnosis not present

## 2013-02-07 DIAGNOSIS — F200 Paranoid schizophrenia: Secondary | ICD-10-CM | POA: Diagnosis not present

## 2013-02-08 DIAGNOSIS — S42209A Unspecified fracture of upper end of unspecified humerus, initial encounter for closed fracture: Secondary | ICD-10-CM | POA: Diagnosis not present

## 2013-02-12 DIAGNOSIS — S42209A Unspecified fracture of upper end of unspecified humerus, initial encounter for closed fracture: Secondary | ICD-10-CM | POA: Diagnosis not present

## 2013-02-14 DIAGNOSIS — S42209A Unspecified fracture of upper end of unspecified humerus, initial encounter for closed fracture: Secondary | ICD-10-CM | POA: Diagnosis not present

## 2013-02-19 DIAGNOSIS — S42209A Unspecified fracture of upper end of unspecified humerus, initial encounter for closed fracture: Secondary | ICD-10-CM | POA: Diagnosis not present

## 2013-02-21 DIAGNOSIS — Z79899 Other long term (current) drug therapy: Secondary | ICD-10-CM | POA: Diagnosis not present

## 2013-02-21 DIAGNOSIS — E119 Type 2 diabetes mellitus without complications: Secondary | ICD-10-CM | POA: Diagnosis not present

## 2013-02-21 DIAGNOSIS — J449 Chronic obstructive pulmonary disease, unspecified: Secondary | ICD-10-CM | POA: Diagnosis not present

## 2013-02-21 DIAGNOSIS — S42209A Unspecified fracture of upper end of unspecified humerus, initial encounter for closed fracture: Secondary | ICD-10-CM | POA: Diagnosis not present

## 2013-02-21 DIAGNOSIS — E785 Hyperlipidemia, unspecified: Secondary | ICD-10-CM | POA: Diagnosis not present

## 2013-02-21 DIAGNOSIS — R634 Abnormal weight loss: Secondary | ICD-10-CM | POA: Diagnosis not present

## 2013-02-26 DIAGNOSIS — S42209A Unspecified fracture of upper end of unspecified humerus, initial encounter for closed fracture: Secondary | ICD-10-CM | POA: Diagnosis not present

## 2013-02-28 DIAGNOSIS — S42209A Unspecified fracture of upper end of unspecified humerus, initial encounter for closed fracture: Secondary | ICD-10-CM | POA: Diagnosis not present

## 2013-03-05 DIAGNOSIS — S42209A Unspecified fracture of upper end of unspecified humerus, initial encounter for closed fracture: Secondary | ICD-10-CM | POA: Diagnosis not present

## 2013-03-07 DIAGNOSIS — S42209A Unspecified fracture of upper end of unspecified humerus, initial encounter for closed fracture: Secondary | ICD-10-CM | POA: Diagnosis not present

## 2013-03-07 DIAGNOSIS — Z79899 Other long term (current) drug therapy: Secondary | ICD-10-CM | POA: Diagnosis not present

## 2013-03-08 DIAGNOSIS — S42209A Unspecified fracture of upper end of unspecified humerus, initial encounter for closed fracture: Secondary | ICD-10-CM | POA: Diagnosis not present

## 2013-03-14 DIAGNOSIS — S42209A Unspecified fracture of upper end of unspecified humerus, initial encounter for closed fracture: Secondary | ICD-10-CM | POA: Diagnosis not present

## 2013-03-19 DIAGNOSIS — S42209A Unspecified fracture of upper end of unspecified humerus, initial encounter for closed fracture: Secondary | ICD-10-CM | POA: Diagnosis not present

## 2013-03-22 DIAGNOSIS — Z79899 Other long term (current) drug therapy: Secondary | ICD-10-CM | POA: Diagnosis not present

## 2013-03-26 DIAGNOSIS — S42209A Unspecified fracture of upper end of unspecified humerus, initial encounter for closed fracture: Secondary | ICD-10-CM | POA: Diagnosis not present

## 2013-03-28 DIAGNOSIS — S42209A Unspecified fracture of upper end of unspecified humerus, initial encounter for closed fracture: Secondary | ICD-10-CM | POA: Diagnosis not present

## 2013-04-03 DIAGNOSIS — Z79899 Other long term (current) drug therapy: Secondary | ICD-10-CM | POA: Diagnosis not present

## 2013-04-05 DIAGNOSIS — S42209A Unspecified fracture of upper end of unspecified humerus, initial encounter for closed fracture: Secondary | ICD-10-CM | POA: Diagnosis not present

## 2013-04-09 DIAGNOSIS — F200 Paranoid schizophrenia: Secondary | ICD-10-CM | POA: Diagnosis not present

## 2013-04-10 DIAGNOSIS — S42209A Unspecified fracture of upper end of unspecified humerus, initial encounter for closed fracture: Secondary | ICD-10-CM | POA: Diagnosis not present

## 2013-04-12 DIAGNOSIS — S42209A Unspecified fracture of upper end of unspecified humerus, initial encounter for closed fracture: Secondary | ICD-10-CM | POA: Diagnosis not present

## 2013-04-23 DIAGNOSIS — Z79899 Other long term (current) drug therapy: Secondary | ICD-10-CM | POA: Diagnosis not present

## 2013-05-07 DIAGNOSIS — Z79899 Other long term (current) drug therapy: Secondary | ICD-10-CM | POA: Diagnosis not present

## 2013-05-23 DIAGNOSIS — K219 Gastro-esophageal reflux disease without esophagitis: Secondary | ICD-10-CM | POA: Diagnosis not present

## 2013-05-23 DIAGNOSIS — J449 Chronic obstructive pulmonary disease, unspecified: Secondary | ICD-10-CM | POA: Diagnosis not present

## 2013-05-23 DIAGNOSIS — E785 Hyperlipidemia, unspecified: Secondary | ICD-10-CM | POA: Diagnosis not present

## 2013-05-23 DIAGNOSIS — E119 Type 2 diabetes mellitus without complications: Secondary | ICD-10-CM | POA: Diagnosis not present

## 2013-05-24 DIAGNOSIS — Z79899 Other long term (current) drug therapy: Secondary | ICD-10-CM | POA: Diagnosis not present

## 2013-05-27 ENCOUNTER — Other Ambulatory Visit (HOSPITAL_COMMUNITY): Payer: Self-pay | Admitting: Internal Medicine

## 2013-05-27 DIAGNOSIS — Z1231 Encounter for screening mammogram for malignant neoplasm of breast: Secondary | ICD-10-CM

## 2013-05-27 DIAGNOSIS — F200 Paranoid schizophrenia: Secondary | ICD-10-CM | POA: Diagnosis not present

## 2013-05-27 DIAGNOSIS — M810 Age-related osteoporosis without current pathological fracture: Secondary | ICD-10-CM

## 2013-06-04 ENCOUNTER — Ambulatory Visit (HOSPITAL_COMMUNITY)
Admission: RE | Admit: 2013-06-04 | Discharge: 2013-06-04 | Disposition: A | Discharge status: Home / Self Care | Payer: Medicare Other | Source: Ambulatory Visit | Attending: Internal Medicine | Admitting: Internal Medicine

## 2013-06-04 DIAGNOSIS — Z13820 Encounter for screening for osteoporosis: Secondary | ICD-10-CM | POA: Insufficient documentation

## 2013-06-04 DIAGNOSIS — Z780 Asymptomatic menopausal state: Secondary | ICD-10-CM | POA: Diagnosis not present

## 2013-06-04 DIAGNOSIS — M810 Age-related osteoporosis without current pathological fracture: Secondary | ICD-10-CM

## 2013-06-04 DIAGNOSIS — M899 Disorder of bone, unspecified: Secondary | ICD-10-CM | POA: Diagnosis not present

## 2013-06-12 DIAGNOSIS — Z79899 Other long term (current) drug therapy: Secondary | ICD-10-CM | POA: Diagnosis not present

## 2013-06-25 DIAGNOSIS — Z79899 Other long term (current) drug therapy: Secondary | ICD-10-CM | POA: Diagnosis not present

## 2013-06-27 ENCOUNTER — Ambulatory Visit (HOSPITAL_COMMUNITY)
Admission: RE | Admit: 2013-06-27 | Discharge: 2013-06-27 | Disposition: A | Discharge status: Home / Self Care | Payer: Medicare Other | Source: Ambulatory Visit | Attending: Internal Medicine | Admitting: Internal Medicine

## 2013-06-27 DIAGNOSIS — Z1231 Encounter for screening mammogram for malignant neoplasm of breast: Secondary | ICD-10-CM | POA: Diagnosis not present

## 2013-07-10 DIAGNOSIS — Z79899 Other long term (current) drug therapy: Secondary | ICD-10-CM | POA: Diagnosis not present

## 2013-07-23 DIAGNOSIS — Z79899 Other long term (current) drug therapy: Secondary | ICD-10-CM | POA: Diagnosis not present

## 2013-08-07 DIAGNOSIS — Z79899 Other long term (current) drug therapy: Secondary | ICD-10-CM | POA: Diagnosis not present

## 2013-08-15 DIAGNOSIS — Z23 Encounter for immunization: Secondary | ICD-10-CM | POA: Diagnosis not present

## 2013-08-22 DIAGNOSIS — Z79899 Other long term (current) drug therapy: Secondary | ICD-10-CM | POA: Diagnosis not present

## 2013-09-03 DIAGNOSIS — S42209A Unspecified fracture of upper end of unspecified humerus, initial encounter for closed fracture: Secondary | ICD-10-CM | POA: Diagnosis not present

## 2013-09-03 DIAGNOSIS — M5030 Other cervical disc degeneration, unspecified cervical region: Secondary | ICD-10-CM | POA: Diagnosis not present

## 2013-09-06 DIAGNOSIS — Z79899 Other long term (current) drug therapy: Secondary | ICD-10-CM | POA: Diagnosis not present

## 2013-09-16 DIAGNOSIS — J4489 Other specified chronic obstructive pulmonary disease: Secondary | ICD-10-CM | POA: Diagnosis not present

## 2013-09-16 DIAGNOSIS — E119 Type 2 diabetes mellitus without complications: Secondary | ICD-10-CM | POA: Diagnosis not present

## 2013-09-16 DIAGNOSIS — Z23 Encounter for immunization: Secondary | ICD-10-CM | POA: Diagnosis not present

## 2013-09-16 DIAGNOSIS — J449 Chronic obstructive pulmonary disease, unspecified: Secondary | ICD-10-CM | POA: Diagnosis not present

## 2013-09-16 DIAGNOSIS — E785 Hyperlipidemia, unspecified: Secondary | ICD-10-CM | POA: Diagnosis not present

## 2013-09-19 DIAGNOSIS — Z79899 Other long term (current) drug therapy: Secondary | ICD-10-CM | POA: Diagnosis not present

## 2013-10-07 DIAGNOSIS — Z79899 Other long term (current) drug therapy: Secondary | ICD-10-CM | POA: Diagnosis not present

## 2013-10-16 DIAGNOSIS — M19019 Primary osteoarthritis, unspecified shoulder: Secondary | ICD-10-CM | POA: Diagnosis not present

## 2013-10-21 DIAGNOSIS — Z79899 Other long term (current) drug therapy: Secondary | ICD-10-CM | POA: Diagnosis not present

## 2013-10-29 DIAGNOSIS — F200 Paranoid schizophrenia: Secondary | ICD-10-CM | POA: Diagnosis not present

## 2013-11-04 DIAGNOSIS — Z79899 Other long term (current) drug therapy: Secondary | ICD-10-CM | POA: Diagnosis not present

## 2013-11-18 DIAGNOSIS — Z79899 Other long term (current) drug therapy: Secondary | ICD-10-CM | POA: Diagnosis not present

## 2013-11-29 DIAGNOSIS — Z79899 Other long term (current) drug therapy: Secondary | ICD-10-CM | POA: Diagnosis not present

## 2013-12-12 DIAGNOSIS — Z79899 Other long term (current) drug therapy: Secondary | ICD-10-CM | POA: Diagnosis not present

## 2013-12-18 DIAGNOSIS — E119 Type 2 diabetes mellitus without complications: Secondary | ICD-10-CM | POA: Diagnosis not present

## 2013-12-18 DIAGNOSIS — K219 Gastro-esophageal reflux disease without esophagitis: Secondary | ICD-10-CM | POA: Diagnosis not present

## 2013-12-18 DIAGNOSIS — J449 Chronic obstructive pulmonary disease, unspecified: Secondary | ICD-10-CM | POA: Diagnosis not present

## 2013-12-18 DIAGNOSIS — F2089 Other schizophrenia: Secondary | ICD-10-CM | POA: Diagnosis not present

## 2013-12-27 DIAGNOSIS — Z79899 Other long term (current) drug therapy: Secondary | ICD-10-CM | POA: Diagnosis not present

## 2014-01-13 DIAGNOSIS — Z79899 Other long term (current) drug therapy: Secondary | ICD-10-CM | POA: Diagnosis not present

## 2014-01-21 DIAGNOSIS — F209 Schizophrenia, unspecified: Secondary | ICD-10-CM | POA: Diagnosis not present

## 2014-01-27 DIAGNOSIS — Z79899 Other long term (current) drug therapy: Secondary | ICD-10-CM | POA: Diagnosis not present

## 2014-02-10 DIAGNOSIS — Z79899 Other long term (current) drug therapy: Secondary | ICD-10-CM | POA: Diagnosis not present

## 2014-02-19 DIAGNOSIS — Z79899 Other long term (current) drug therapy: Secondary | ICD-10-CM | POA: Diagnosis not present

## 2014-03-12 DIAGNOSIS — Z79899 Other long term (current) drug therapy: Secondary | ICD-10-CM | POA: Diagnosis not present

## 2014-04-04 DIAGNOSIS — F209 Schizophrenia, unspecified: Secondary | ICD-10-CM | POA: Diagnosis not present

## 2014-04-08 DIAGNOSIS — Z79899 Other long term (current) drug therapy: Secondary | ICD-10-CM | POA: Diagnosis not present

## 2014-04-17 DIAGNOSIS — E119 Type 2 diabetes mellitus without complications: Secondary | ICD-10-CM | POA: Diagnosis not present

## 2014-04-17 DIAGNOSIS — J449 Chronic obstructive pulmonary disease, unspecified: Secondary | ICD-10-CM | POA: Diagnosis not present

## 2014-04-17 DIAGNOSIS — E785 Hyperlipidemia, unspecified: Secondary | ICD-10-CM | POA: Diagnosis not present

## 2014-05-02 DIAGNOSIS — F209 Schizophrenia, unspecified: Secondary | ICD-10-CM | POA: Diagnosis not present

## 2014-05-06 DIAGNOSIS — F209 Schizophrenia, unspecified: Secondary | ICD-10-CM | POA: Diagnosis not present

## 2014-05-22 ENCOUNTER — Other Ambulatory Visit (HOSPITAL_COMMUNITY): Payer: Self-pay | Admitting: Internal Medicine

## 2014-05-22 DIAGNOSIS — Z1231 Encounter for screening mammogram for malignant neoplasm of breast: Secondary | ICD-10-CM

## 2014-05-30 DIAGNOSIS — Z79899 Other long term (current) drug therapy: Secondary | ICD-10-CM | POA: Diagnosis not present

## 2014-06-02 DIAGNOSIS — F209 Schizophrenia, unspecified: Secondary | ICD-10-CM | POA: Diagnosis not present

## 2014-06-27 DIAGNOSIS — Z79899 Other long term (current) drug therapy: Secondary | ICD-10-CM | POA: Diagnosis not present

## 2014-06-30 ENCOUNTER — Ambulatory Visit (HOSPITAL_COMMUNITY)
Admission: RE | Admit: 2014-06-30 | Discharge: 2014-06-30 | Disposition: A | Discharge status: Home / Self Care | Payer: Medicare Other | Source: Ambulatory Visit | Attending: Internal Medicine | Admitting: Internal Medicine

## 2014-06-30 DIAGNOSIS — Z1231 Encounter for screening mammogram for malignant neoplasm of breast: Secondary | ICD-10-CM | POA: Diagnosis not present

## 2014-07-01 DIAGNOSIS — F209 Schizophrenia, unspecified: Secondary | ICD-10-CM | POA: Diagnosis not present

## 2014-07-07 DIAGNOSIS — F209 Schizophrenia, unspecified: Secondary | ICD-10-CM | POA: Diagnosis not present

## 2014-07-28 DIAGNOSIS — Z79899 Other long term (current) drug therapy: Secondary | ICD-10-CM | POA: Diagnosis not present

## 2014-08-04 DIAGNOSIS — F209 Schizophrenia, unspecified: Secondary | ICD-10-CM | POA: Diagnosis not present

## 2014-08-18 DIAGNOSIS — F209 Schizophrenia, unspecified: Secondary | ICD-10-CM | POA: Diagnosis not present

## 2014-08-21 DIAGNOSIS — F209 Schizophrenia, unspecified: Secondary | ICD-10-CM | POA: Diagnosis not present

## 2014-08-21 DIAGNOSIS — Z79899 Other long term (current) drug therapy: Secondary | ICD-10-CM | POA: Diagnosis not present

## 2014-09-19 DIAGNOSIS — F209 Schizophrenia, unspecified: Secondary | ICD-10-CM | POA: Diagnosis not present

## 2014-09-30 DIAGNOSIS — Z23 Encounter for immunization: Secondary | ICD-10-CM | POA: Diagnosis not present

## 2014-10-17 DIAGNOSIS — F209 Schizophrenia, unspecified: Secondary | ICD-10-CM | POA: Diagnosis not present

## 2014-10-23 DIAGNOSIS — J449 Chronic obstructive pulmonary disease, unspecified: Secondary | ICD-10-CM | POA: Diagnosis not present

## 2014-10-23 DIAGNOSIS — F2089 Other schizophrenia: Secondary | ICD-10-CM | POA: Diagnosis not present

## 2014-10-23 DIAGNOSIS — E784 Other hyperlipidemia: Secondary | ICD-10-CM | POA: Diagnosis not present

## 2014-10-23 DIAGNOSIS — Z124 Encounter for screening for malignant neoplasm of cervix: Secondary | ICD-10-CM | POA: Diagnosis not present

## 2014-10-23 DIAGNOSIS — E119 Type 2 diabetes mellitus without complications: Secondary | ICD-10-CM | POA: Diagnosis not present

## 2014-11-03 DIAGNOSIS — F209 Schizophrenia, unspecified: Secondary | ICD-10-CM | POA: Diagnosis not present

## 2014-11-26 DIAGNOSIS — F209 Schizophrenia, unspecified: Secondary | ICD-10-CM | POA: Diagnosis not present

## 2014-12-17 DIAGNOSIS — F209 Schizophrenia, unspecified: Secondary | ICD-10-CM | POA: Diagnosis not present

## 2015-01-06 DIAGNOSIS — F209 Schizophrenia, unspecified: Secondary | ICD-10-CM | POA: Diagnosis not present

## 2015-01-19 DIAGNOSIS — F209 Schizophrenia, unspecified: Secondary | ICD-10-CM | POA: Diagnosis not present

## 2015-02-27 DIAGNOSIS — F209 Schizophrenia, unspecified: Secondary | ICD-10-CM | POA: Diagnosis not present

## 2015-03-19 DIAGNOSIS — F209 Schizophrenia, unspecified: Secondary | ICD-10-CM | POA: Diagnosis not present

## 2015-04-03 DIAGNOSIS — F209 Schizophrenia, unspecified: Secondary | ICD-10-CM | POA: Diagnosis not present

## 2015-04-23 DIAGNOSIS — F2089 Other schizophrenia: Secondary | ICD-10-CM | POA: Diagnosis not present

## 2015-04-23 DIAGNOSIS — K219 Gastro-esophageal reflux disease without esophagitis: Secondary | ICD-10-CM | POA: Diagnosis not present

## 2015-04-23 DIAGNOSIS — E784 Other hyperlipidemia: Secondary | ICD-10-CM | POA: Diagnosis not present

## 2015-04-23 DIAGNOSIS — E119 Type 2 diabetes mellitus without complications: Secondary | ICD-10-CM | POA: Diagnosis not present

## 2015-04-23 DIAGNOSIS — J449 Chronic obstructive pulmonary disease, unspecified: Secondary | ICD-10-CM | POA: Diagnosis not present

## 2015-04-23 DIAGNOSIS — M810 Age-related osteoporosis without current pathological fracture: Secondary | ICD-10-CM | POA: Diagnosis not present

## 2015-04-27 ENCOUNTER — Other Ambulatory Visit: Payer: Self-pay | Admitting: Internal Medicine

## 2015-04-27 DIAGNOSIS — M810 Age-related osteoporosis without current pathological fracture: Secondary | ICD-10-CM

## 2015-04-29 ENCOUNTER — Other Ambulatory Visit: Payer: Self-pay | Admitting: Family Medicine

## 2015-05-21 DIAGNOSIS — F209 Schizophrenia, unspecified: Secondary | ICD-10-CM | POA: Diagnosis not present

## 2015-05-25 ENCOUNTER — Other Ambulatory Visit (HOSPITAL_COMMUNITY): Payer: Self-pay | Admitting: Internal Medicine

## 2015-05-25 DIAGNOSIS — Z1231 Encounter for screening mammogram for malignant neoplasm of breast: Secondary | ICD-10-CM

## 2015-05-25 DIAGNOSIS — F209 Schizophrenia, unspecified: Secondary | ICD-10-CM | POA: Diagnosis not present

## 2015-06-08 ENCOUNTER — Ambulatory Visit
Admission: RE | Admit: 2015-06-08 | Discharge: 2015-06-08 | Disposition: A | Discharge status: Home / Self Care | Payer: Medicare Other | Source: Ambulatory Visit | Attending: Internal Medicine | Admitting: Internal Medicine

## 2015-06-08 DIAGNOSIS — M810 Age-related osteoporosis without current pathological fracture: Secondary | ICD-10-CM

## 2015-06-08 DIAGNOSIS — M85852 Other specified disorders of bone density and structure, left thigh: Secondary | ICD-10-CM | POA: Diagnosis not present

## 2015-06-23 DIAGNOSIS — F209 Schizophrenia, unspecified: Secondary | ICD-10-CM | POA: Diagnosis not present

## 2015-07-02 ENCOUNTER — Ambulatory Visit (HOSPITAL_COMMUNITY)
Admission: RE | Admit: 2015-07-02 | Discharge: 2015-07-02 | Disposition: A | Discharge status: Home / Self Care | Payer: Medicare Other | Source: Ambulatory Visit | Attending: Internal Medicine | Admitting: Internal Medicine

## 2015-07-02 DIAGNOSIS — R928 Other abnormal and inconclusive findings on diagnostic imaging of breast: Secondary | ICD-10-CM | POA: Insufficient documentation

## 2015-07-02 DIAGNOSIS — Z1231 Encounter for screening mammogram for malignant neoplasm of breast: Secondary | ICD-10-CM | POA: Insufficient documentation

## 2015-07-06 ENCOUNTER — Other Ambulatory Visit: Payer: Self-pay | Admitting: Internal Medicine

## 2015-07-06 DIAGNOSIS — R928 Other abnormal and inconclusive findings on diagnostic imaging of breast: Secondary | ICD-10-CM

## 2015-07-13 ENCOUNTER — Ambulatory Visit
Admission: RE | Admit: 2015-07-13 | Discharge: 2015-07-13 | Disposition: A | Discharge status: Home / Self Care | Payer: Medicare Other | Source: Ambulatory Visit | Attending: Internal Medicine | Admitting: Internal Medicine

## 2015-07-13 DIAGNOSIS — R928 Other abnormal and inconclusive findings on diagnostic imaging of breast: Secondary | ICD-10-CM

## 2015-07-23 DIAGNOSIS — F2089 Other schizophrenia: Secondary | ICD-10-CM | POA: Diagnosis not present

## 2015-07-23 DIAGNOSIS — E119 Type 2 diabetes mellitus without complications: Secondary | ICD-10-CM | POA: Diagnosis not present

## 2015-07-23 DIAGNOSIS — Z0001 Encounter for general adult medical examination with abnormal findings: Secondary | ICD-10-CM | POA: Diagnosis not present

## 2015-07-23 DIAGNOSIS — J449 Chronic obstructive pulmonary disease, unspecified: Secondary | ICD-10-CM | POA: Diagnosis not present

## 2015-07-23 DIAGNOSIS — E784 Other hyperlipidemia: Secondary | ICD-10-CM | POA: Diagnosis not present

## 2015-07-23 DIAGNOSIS — K219 Gastro-esophageal reflux disease without esophagitis: Secondary | ICD-10-CM | POA: Diagnosis not present

## 2015-07-24 DIAGNOSIS — F209 Schizophrenia, unspecified: Secondary | ICD-10-CM | POA: Diagnosis not present

## 2015-08-03 DIAGNOSIS — F2089 Other schizophrenia: Secondary | ICD-10-CM | POA: Diagnosis not present

## 2015-08-03 DIAGNOSIS — K219 Gastro-esophageal reflux disease without esophagitis: Secondary | ICD-10-CM | POA: Diagnosis not present

## 2015-08-03 DIAGNOSIS — J449 Chronic obstructive pulmonary disease, unspecified: Secondary | ICD-10-CM | POA: Diagnosis not present

## 2015-08-03 DIAGNOSIS — B355 Tinea imbricata: Secondary | ICD-10-CM | POA: Diagnosis not present

## 2015-08-03 DIAGNOSIS — E119 Type 2 diabetes mellitus without complications: Secondary | ICD-10-CM | POA: Diagnosis not present

## 2015-08-17 DIAGNOSIS — F209 Schizophrenia, unspecified: Secondary | ICD-10-CM | POA: Diagnosis not present

## 2015-08-20 DIAGNOSIS — Z23 Encounter for immunization: Secondary | ICD-10-CM | POA: Diagnosis not present

## 2015-08-24 DIAGNOSIS — F209 Schizophrenia, unspecified: Secondary | ICD-10-CM | POA: Diagnosis not present

## 2015-09-24 DIAGNOSIS — F209 Schizophrenia, unspecified: Secondary | ICD-10-CM | POA: Diagnosis not present

## 2015-10-26 DIAGNOSIS — F209 Schizophrenia, unspecified: Secondary | ICD-10-CM | POA: Diagnosis not present

## 2015-11-04 DIAGNOSIS — J449 Chronic obstructive pulmonary disease, unspecified: Secondary | ICD-10-CM | POA: Diagnosis not present

## 2015-11-04 DIAGNOSIS — F2089 Other schizophrenia: Secondary | ICD-10-CM | POA: Diagnosis not present

## 2015-11-04 DIAGNOSIS — M12851 Other specific arthropathies, not elsewhere classified, right hip: Secondary | ICD-10-CM | POA: Diagnosis not present

## 2015-11-04 DIAGNOSIS — E119 Type 2 diabetes mellitus without complications: Secondary | ICD-10-CM | POA: Diagnosis not present

## 2015-11-15 HISTORY — PX: BREAST BIOPSY: SHX20

## 2015-11-26 DIAGNOSIS — F209 Schizophrenia, unspecified: Secondary | ICD-10-CM | POA: Diagnosis not present

## 2015-12-28 DIAGNOSIS — F209 Schizophrenia, unspecified: Secondary | ICD-10-CM | POA: Diagnosis not present

## 2015-12-29 DIAGNOSIS — F209 Schizophrenia, unspecified: Secondary | ICD-10-CM | POA: Diagnosis not present

## 2016-01-25 DIAGNOSIS — F209 Schizophrenia, unspecified: Secondary | ICD-10-CM | POA: Diagnosis not present

## 2016-01-28 DIAGNOSIS — E538 Deficiency of other specified B group vitamins: Secondary | ICD-10-CM | POA: Diagnosis not present

## 2016-01-28 DIAGNOSIS — L259 Unspecified contact dermatitis, unspecified cause: Secondary | ICD-10-CM | POA: Diagnosis not present

## 2016-01-28 DIAGNOSIS — J449 Chronic obstructive pulmonary disease, unspecified: Secondary | ICD-10-CM | POA: Diagnosis not present

## 2016-01-28 DIAGNOSIS — Z719 Counseling, unspecified: Secondary | ICD-10-CM | POA: Diagnosis not present

## 2016-01-28 DIAGNOSIS — F2089 Other schizophrenia: Secondary | ICD-10-CM | POA: Diagnosis not present

## 2016-01-28 DIAGNOSIS — D649 Anemia, unspecified: Secondary | ICD-10-CM | POA: Diagnosis not present

## 2016-01-28 DIAGNOSIS — K219 Gastro-esophageal reflux disease without esophagitis: Secondary | ICD-10-CM | POA: Diagnosis not present

## 2016-01-28 DIAGNOSIS — E119 Type 2 diabetes mellitus without complications: Secondary | ICD-10-CM | POA: Diagnosis not present

## 2016-02-08 DIAGNOSIS — H40013 Open angle with borderline findings, low risk, bilateral: Secondary | ICD-10-CM | POA: Diagnosis not present

## 2016-02-08 DIAGNOSIS — E119 Type 2 diabetes mellitus without complications: Secondary | ICD-10-CM | POA: Diagnosis not present

## 2016-03-03 DIAGNOSIS — F209 Schizophrenia, unspecified: Secondary | ICD-10-CM | POA: Diagnosis not present

## 2016-03-15 DIAGNOSIS — F209 Schizophrenia, unspecified: Secondary | ICD-10-CM | POA: Diagnosis not present

## 2016-04-12 DIAGNOSIS — F209 Schizophrenia, unspecified: Secondary | ICD-10-CM | POA: Diagnosis not present

## 2016-04-28 DIAGNOSIS — J449 Chronic obstructive pulmonary disease, unspecified: Secondary | ICD-10-CM | POA: Diagnosis not present

## 2016-04-28 DIAGNOSIS — F2089 Other schizophrenia: Secondary | ICD-10-CM | POA: Diagnosis not present

## 2016-04-28 DIAGNOSIS — E784 Other hyperlipidemia: Secondary | ICD-10-CM | POA: Diagnosis not present

## 2016-04-28 DIAGNOSIS — D649 Anemia, unspecified: Secondary | ICD-10-CM | POA: Diagnosis not present

## 2016-04-28 DIAGNOSIS — E119 Type 2 diabetes mellitus without complications: Secondary | ICD-10-CM | POA: Diagnosis not present

## 2016-05-16 DIAGNOSIS — F209 Schizophrenia, unspecified: Secondary | ICD-10-CM | POA: Diagnosis not present

## 2016-06-03 ENCOUNTER — Other Ambulatory Visit: Payer: Self-pay | Admitting: Internal Medicine

## 2016-06-03 DIAGNOSIS — Z1231 Encounter for screening mammogram for malignant neoplasm of breast: Secondary | ICD-10-CM

## 2016-06-24 DIAGNOSIS — F209 Schizophrenia, unspecified: Secondary | ICD-10-CM | POA: Diagnosis not present

## 2016-07-11 ENCOUNTER — Ambulatory Visit
Admission: RE | Admit: 2016-07-11 | Discharge: 2016-07-11 | Disposition: A | Discharge status: Home / Self Care | Payer: Medicare Other | Source: Ambulatory Visit | Attending: Internal Medicine | Admitting: Internal Medicine

## 2016-07-11 DIAGNOSIS — Z1231 Encounter for screening mammogram for malignant neoplasm of breast: Secondary | ICD-10-CM

## 2016-07-12 ENCOUNTER — Other Ambulatory Visit: Payer: Self-pay | Admitting: Internal Medicine

## 2016-07-12 DIAGNOSIS — R928 Other abnormal and inconclusive findings on diagnostic imaging of breast: Secondary | ICD-10-CM

## 2016-07-19 ENCOUNTER — Ambulatory Visit
Admission: RE | Admit: 2016-07-19 | Discharge: 2016-07-19 | Disposition: A | Discharge status: Home / Self Care | Payer: Medicare Other | Source: Ambulatory Visit | Attending: Internal Medicine | Admitting: Internal Medicine

## 2016-07-19 ENCOUNTER — Other Ambulatory Visit: Payer: Self-pay | Admitting: Internal Medicine

## 2016-07-19 DIAGNOSIS — R928 Other abnormal and inconclusive findings on diagnostic imaging of breast: Secondary | ICD-10-CM

## 2016-07-19 DIAGNOSIS — R921 Mammographic calcification found on diagnostic imaging of breast: Secondary | ICD-10-CM | POA: Diagnosis not present

## 2016-07-25 ENCOUNTER — Ambulatory Visit
Admission: RE | Admit: 2016-07-25 | Discharge: 2016-07-25 | Disposition: A | Discharge status: Home / Self Care | Payer: Medicare Other | Source: Ambulatory Visit | Attending: Internal Medicine | Admitting: Internal Medicine

## 2016-07-25 DIAGNOSIS — R928 Other abnormal and inconclusive findings on diagnostic imaging of breast: Secondary | ICD-10-CM

## 2016-07-25 DIAGNOSIS — R921 Mammographic calcification found on diagnostic imaging of breast: Secondary | ICD-10-CM

## 2016-07-25 DIAGNOSIS — D241 Benign neoplasm of right breast: Secondary | ICD-10-CM | POA: Diagnosis not present

## 2016-07-26 DIAGNOSIS — J449 Chronic obstructive pulmonary disease, unspecified: Secondary | ICD-10-CM | POA: Diagnosis not present

## 2016-07-26 DIAGNOSIS — E119 Type 2 diabetes mellitus without complications: Secondary | ICD-10-CM | POA: Diagnosis not present

## 2016-07-26 DIAGNOSIS — E784 Other hyperlipidemia: Secondary | ICD-10-CM | POA: Diagnosis not present

## 2016-07-26 DIAGNOSIS — Z23 Encounter for immunization: Secondary | ICD-10-CM | POA: Diagnosis not present

## 2016-07-26 DIAGNOSIS — F2089 Other schizophrenia: Secondary | ICD-10-CM | POA: Diagnosis not present

## 2016-07-26 DIAGNOSIS — R930 Abnormal findings on diagnostic imaging of skull and head, not elsewhere classified: Secondary | ICD-10-CM | POA: Diagnosis not present

## 2016-08-12 DIAGNOSIS — H5340 Unspecified visual field defects: Secondary | ICD-10-CM | POA: Diagnosis not present

## 2016-08-12 DIAGNOSIS — H40013 Open angle with borderline findings, low risk, bilateral: Secondary | ICD-10-CM | POA: Diagnosis not present

## 2016-08-13 DIAGNOSIS — Z23 Encounter for immunization: Secondary | ICD-10-CM | POA: Diagnosis not present

## 2016-08-18 DIAGNOSIS — F209 Schizophrenia, unspecified: Secondary | ICD-10-CM | POA: Diagnosis not present

## 2016-08-20 ENCOUNTER — Emergency Department (HOSPITAL_COMMUNITY): Payer: Medicare Other

## 2016-08-20 ENCOUNTER — Encounter (HOSPITAL_COMMUNITY): Payer: Self-pay

## 2016-08-20 ENCOUNTER — Emergency Department (HOSPITAL_COMMUNITY)
Admission: EM | Admit: 2016-08-20 | Discharge: 2016-08-21 | Disposition: A | Discharge status: Other Acute Inpatient Hospital | Payer: Medicare Other | Attending: Emergency Medicine | Admitting: Emergency Medicine

## 2016-08-20 DIAGNOSIS — Z87891 Personal history of nicotine dependence: Secondary | ICD-10-CM | POA: Insufficient documentation

## 2016-08-20 DIAGNOSIS — I517 Cardiomegaly: Secondary | ICD-10-CM | POA: Diagnosis not present

## 2016-08-20 DIAGNOSIS — R4182 Altered mental status, unspecified: Secondary | ICD-10-CM | POA: Diagnosis not present

## 2016-08-20 DIAGNOSIS — F419 Anxiety disorder, unspecified: Secondary | ICD-10-CM | POA: Diagnosis not present

## 2016-08-20 DIAGNOSIS — F209 Schizophrenia, unspecified: Secondary | ICD-10-CM | POA: Diagnosis not present

## 2016-08-20 DIAGNOSIS — F23 Brief psychotic disorder: Secondary | ICD-10-CM | POA: Diagnosis not present

## 2016-08-20 DIAGNOSIS — Z7984 Long term (current) use of oral hypoglycemic drugs: Secondary | ICD-10-CM | POA: Diagnosis not present

## 2016-08-20 DIAGNOSIS — I10 Essential (primary) hypertension: Secondary | ICD-10-CM | POA: Diagnosis not present

## 2016-08-20 DIAGNOSIS — Z79899 Other long term (current) drug therapy: Secondary | ICD-10-CM | POA: Diagnosis not present

## 2016-08-20 DIAGNOSIS — E119 Type 2 diabetes mellitus without complications: Secondary | ICD-10-CM | POA: Insufficient documentation

## 2016-08-20 DIAGNOSIS — J449 Chronic obstructive pulmonary disease, unspecified: Secondary | ICD-10-CM | POA: Diagnosis not present

## 2016-08-20 LAB — I-STAT CHEM 8, ED
BUN: 12 mg/dL (ref 6–20)
Calcium, Ion: 1.19 mmol/L (ref 1.15–1.40)
Chloride: 109 mmol/L (ref 101–111)
Creatinine, Ser: 0.8 mg/dL (ref 0.44–1.00)
Glucose, Bld: 130 mg/dL — ABNORMAL HIGH (ref 65–99)
HEMATOCRIT: 30 % — AB (ref 36.0–46.0)
HEMOGLOBIN: 10.2 g/dL — AB (ref 12.0–15.0)
POTASSIUM: 3.2 mmol/L — AB (ref 3.5–5.1)
SODIUM: 145 mmol/L (ref 135–145)
TCO2: 22 mmol/L (ref 0–100)

## 2016-08-20 LAB — URINALYSIS, ROUTINE W REFLEX MICROSCOPIC
BILIRUBIN URINE: NEGATIVE
Glucose, UA: NEGATIVE mg/dL
Hgb urine dipstick: NEGATIVE
KETONES UR: NEGATIVE mg/dL
NITRITE: NEGATIVE
PROTEIN: NEGATIVE mg/dL
Specific Gravity, Urine: <1.005 — ABNORMAL LOW (ref 1.005–1.030)
pH: 6.5 (ref 5.0–8.0)

## 2016-08-20 LAB — CBC WITH DIFFERENTIAL/PLATELET
BASOS ABS: 0 10*3/uL (ref 0.0–0.1)
BASOS PCT: 0 %
EOS ABS: 0.1 10*3/uL (ref 0.0–0.7)
Eosinophils Relative: 1 %
HCT: 31.9 % — ABNORMAL LOW (ref 36.0–46.0)
HEMOGLOBIN: 9.7 g/dL — AB (ref 12.0–15.0)
Lymphocytes Relative: 27 %
Lymphs Abs: 1.4 10*3/uL (ref 0.7–4.0)
MCH: 28.6 pg (ref 26.0–34.0)
MCHC: 30.4 g/dL (ref 30.0–36.0)
MCV: 94.1 fL (ref 78.0–100.0)
MONOS PCT: 8 %
Monocytes Absolute: 0.5 10*3/uL (ref 0.1–1.0)
NEUTROS PCT: 64 %
Neutro Abs: 3.4 10*3/uL (ref 1.7–7.7)
Platelets: 291 10*3/uL (ref 150–400)
RBC: 3.39 MIL/uL — ABNORMAL LOW (ref 3.87–5.11)
RDW: 14.7 % (ref 11.5–15.5)
WBC: 5.3 10*3/uL (ref 4.0–10.5)

## 2016-08-20 LAB — COMPREHENSIVE METABOLIC PANEL
ALBUMIN: 3.7 g/dL (ref 3.5–5.0)
ALK PHOS: 31 U/L — AB (ref 38–126)
ALT: 33 U/L (ref 14–54)
ANION GAP: 10 (ref 5–15)
AST: 35 U/L (ref 15–41)
BUN: 12 mg/dL (ref 6–20)
CO2: 22 mmol/L (ref 22–32)
Calcium: 9.2 mg/dL (ref 8.9–10.3)
Chloride: 111 mmol/L (ref 101–111)
Creatinine, Ser: 0.9 mg/dL (ref 0.44–1.00)
GFR calc Af Amer: >60 mL/min (ref >60)
GFR calc non Af Amer: >60 mL/min (ref >60)
GLUCOSE: 135 mg/dL — AB (ref 65–99)
POTASSIUM: 3.2 mmol/L — AB (ref 3.5–5.1)
SODIUM: 143 mmol/L (ref 135–145)
TOTAL PROTEIN: 6 g/dL — AB (ref 6.5–8.1)
Total Bilirubin: 0.6 mg/dL (ref 0.3–1.2)

## 2016-08-20 LAB — I-STAT VENOUS BLOOD GAS, ED
ACID-BASE DEFICIT: 3 mmol/L — AB (ref 0.0–2.0)
BICARBONATE: 22.4 mmol/L (ref 20.0–28.0)
O2 SAT: 76 %
PO2 VEN: 42 mmHg (ref 32.0–45.0)
TCO2: 24 mmol/L (ref 0–100)
pCO2, Ven: 39.2 mmHg — ABNORMAL LOW (ref 44.0–60.0)
pH, Ven: 7.365 (ref 7.250–7.430)

## 2016-08-20 LAB — URINE MICROSCOPIC-ADD ON

## 2016-08-20 LAB — RAPID URINE DRUG SCREEN, HOSP PERFORMED
Amphetamines: NOT DETECTED
Barbiturates: NOT DETECTED
Benzodiazepines: NOT DETECTED
COCAINE: NOT DETECTED
OPIATES: NOT DETECTED
TETRAHYDROCANNABINOL: NOT DETECTED

## 2016-08-20 LAB — CBG MONITORING, ED: GLUCOSE-CAPILLARY: 127 mg/dL — AB (ref 65–99)

## 2016-08-20 LAB — I-STAT CG4 LACTIC ACID, ED: LACTIC ACID, VENOUS: 1.29 mmol/L (ref 0.5–1.9)

## 2016-08-20 LAB — SALICYLATE LEVEL

## 2016-08-20 LAB — ACETAMINOPHEN LEVEL: Acetaminophen (Tylenol), Serum: <10 ug/mL — ABNORMAL LOW (ref 10–30)

## 2016-08-20 MED ORDER — PANTOPRAZOLE SODIUM 40 MG PO TBEC
40.0000 mg | DELAYED_RELEASE_TABLET | Freq: Every day | ORAL | Status: DC
  Administered 2016-08-20 – 2016-08-21 (×2): 40 mg via ORAL
  Filled 2016-08-20 (×2): qty 1

## 2016-08-20 MED ORDER — METFORMIN HCL 500 MG PO TABS
500.0000 mg | ORAL_TABLET | Freq: Two times a day (BID) | ORAL | Status: DC
  Administered 2016-08-21: 500 mg via ORAL
  Filled 2016-08-20: qty 1

## 2016-08-20 MED ORDER — SIMVASTATIN 40 MG PO TABS
40.0000 mg | ORAL_TABLET | Freq: Every day | ORAL | Status: DC
  Administered 2016-08-20: 40 mg via ORAL
  Filled 2016-08-20 (×2): qty 1

## 2016-08-20 MED ORDER — DONEPEZIL HCL 5 MG PO TABS
10.0000 mg | ORAL_TABLET | Freq: Every day | ORAL | Status: DC
  Administered 2016-08-20: 10 mg via ORAL
  Filled 2016-08-20: qty 2

## 2016-08-20 MED ORDER — CLOZAPINE 100 MG PO TABS
200.0000 mg | ORAL_TABLET | Freq: Every day | ORAL | Status: DC
  Administered 2016-08-20: 200 mg via ORAL
  Filled 2016-08-20: qty 2

## 2016-08-20 NOTE — BHH Counselor (Signed)
Per Mickel Baas, NP Pt meets inpatient/gero-psych criteria.  TTS to seek placement.  Lorenza [place], [facility] Triage Specialist

## 2016-08-20 NOTE — ED Notes (Signed)
Per report, venous blood gas.

## 2016-08-20 NOTE — BH Assessment (Signed)
Tele Assessment Note   Elizabeth Schwartz is an 70 y.o. female.  Pt denies SI/HI and AVH. Pt was found outside of her apartment yelling at people. The police and EMS were called. The Pt states she does not remember why she was outside or why she was yelling. The Pt states she last remembers looking for her will. The Pt resides alone. The Pt does not have any family that checks on her. The Pt states that some friends from church check on her weekly. The Pt  states that she needs assistance with taking care of her home. The Pt reports Type II diabetes. The Pt states that she has been diagnosed with Schizophrenia but she cannot recall her medication. Pt states she is seen by a psychiatrist but she cannot recall the psychiatrist's name. The Pt admits to 1 previous hospitalization. Pt states she believes she was hospitalized 5 years ago. Pt had difficulty remembering the questions that were asked during the assessment.  Writer consulted with Mickel Baas, NP. Per Mickel Baas the Pt meets gero-psych criteria for inpatient. TTS to seek placement.   Diagnosis:  F20.9 Schizophrenia  Past Medical History:  Past Medical History:  Diagnosis Date  . Anemia   . COPD (chronic obstructive pulmonary disease) ([place])   . Depression   . Diabetes mellitus   . GERD (gastroesophageal reflux disease)   . Hyperlipidemia   . Hypertension   . Osteoporosis   . Schizophrenia, schizo-affective ([place])   . SOB (shortness of breath) on exertion     Past Surgical History:  Procedure Laterality Date  . LAPAROSCOPY  1975   ABD pain  . VAGINAL HYSTERECTOMY  1975    Family History: No family history on file.  Social History:  reports that she quit smoking about 4 years ago. Her smoking use included Cigarettes. She quit after 15.00 years of use. She has quit using smokeless tobacco. Her smokeless tobacco use included Chew. She reports that she drinks alcohol. She reports that she does not use drugs.  Additional Social History:  Alcohol /  Drug Use Pain Medications: Pt denies Prescriptions: Pt cannot recall Over the Counter: Pt denies History of alcohol / drug use?: No history of alcohol / drug abuse Longest period of sobriety (when/how long): NA  CIWA: CIWA-Ar BP: (!) 126/49 Pulse Rate: 95 COWS:    PATIENT STRENGTHS: (choose at least two) Average or above average intelligence Communication skills  Allergies: No Known Allergies  Home Medications:  (Not in a hospital admission)  OB/GYN Status:  No LMP recorded. Patient has had a hysterectomy.  General Assessment Data Location of Assessment: [facility] ED TTS Assessment: In system Is this a Tele or Face-to-Face Assessment?: Tele Assessment Is this an Initial Assessment or a Re-assessment for this encounter?: Initial Assessment Marital status: Divorced [place] name: NA Is patient pregnant?: No Pregnancy Status: No Living Arrangements: Alone Can pt return to current living arrangement?: Yes Admission Status: Voluntary Is patient capable of signing voluntary admission?: Yes Referral Source: Self/Family/Friend Insurance type: Medicare     Crisis Care Plan Living Arrangements: Alone Legal Guardian: Other: (self) Name of Psychiatrist: unknown Name of Therapist: NA  Education Status Is patient currently in school?: No Current Grade: NA Highest grade of school patient has completed: 12 Name of school: NA Contact person: NA  Risk to self with the past 6 months Suicidal Ideation: No Has patient been a risk to self within the past 6 months prior to admission? : No Suicidal Intent: No Has patient had  any suicidal intent within the past 6 months prior to admission? : No Is patient at risk for suicide?: No Suicidal Plan?: No Has patient had any suicidal plan within the past 6 months prior to admission? : No Access to Means: No What has been your use of drugs/alcohol within the last 12 months?: NA Previous Attempts/Gestures: No How many times?: 0 Other Self Harm  Risks: NA Triggers for Past Attempts: None known Intentional Self Injurious Behavior: None Family Suicide History: No Recent stressful life event(s):  (NA) Persecutory voices/beliefs?: No Depression: No Depression Symptoms:  (Pt denies) Substance abuse history and/or treatment for substance abuse?: No Suicide prevention information given to non-admitted patients: Not applicable  Risk to Others within the past 6 months Homicidal Ideation: No Does patient have any lifetime risk of violence toward others beyond the six months prior to admission? : No Thoughts of Harm to Others: No Current Homicidal Intent: No Current Homicidal Plan: No Access to Homicidal Means: No Identified Victim: NA History of harm to others?: No Assessment of Violence: None Noted Violent Behavior Description: NA Does patient have access to weapons?: No Criminal Charges Pending?: No Does patient have a court date: No Is patient on probation?: No  Psychosis Hallucinations: None noted Delusions: None noted  Mental Status Report Appearance/Hygiene: Unremarkable, In scrubs Eye Contact: Fair Motor Activity: Freedom of movement Speech: Logical/coherent Level of Consciousness: Alert Mood: Euthymic Affect: Appropriate to circumstance Anxiety Level: None Thought Processes: Coherent, Relevant Judgement: Unimpaired Orientation: Person, Place, Time, Situation, Appropriate for developmental age Obsessive Compulsive Thoughts/Behaviors: None  Cognitive Functioning Concentration: Normal Memory: Recent Impaired, Remote Impaired IQ: Average Insight: Fair Impulse Control: Fair Appetite: Poor Weight Loss: 0 Weight Gain: 0 Sleep: No Change Total Hours of Sleep: 8 Vegetative Symptoms: None  ADLScreening [facility] Assessment Services) Patient's cognitive ability adequate to safely complete daily activities?: No Patient able to express need for assistance with ADLs?: Yes Independently performs ADLs?: Yes (appropriate  for developmental age)  Prior Inpatient Therapy Prior Inpatient Therapy: Yes Prior Therapy Dates: 2015 Prior Therapy Facilty/Provider(s): [facility] Reason for Treatment: Schizophrenia  Prior Outpatient Therapy Prior Outpatient Therapy: Yes Prior Therapy Dates: 2016 Prior Therapy Facilty/Provider(s): NA Reason for Treatment: NA Does patient have an ACCT team?: No Does patient have Intensive In-House Services?  : No Does patient have [facility] services? : No Does patient have P4CC services?: No  ADL Screening (condition at time of admission) Patient's cognitive ability adequate to safely complete daily activities?: No Is the patient deaf or have difficulty hearing?: No Does the patient have difficulty seeing, even when wearing glasses/contacts?: No Does the patient have difficulty concentrating, remembering, or making decisions?: Yes Patient able to express need for assistance with ADLs?: Yes Does the patient have difficulty dressing or bathing?: No Independently performs ADLs?: Yes (appropriate for developmental age) Does the patient have difficulty walking or climbing stairs?: Yes Weakness of Legs: Both       Abuse/Neglect Assessment (Assessment to be complete while patient is alone) Physical Abuse: Denies Verbal Abuse: Denies Sexual Abuse: Denies Exploitation of patient/patient's resources: Denies Self-Neglect: Denies     Regulatory affairs officer (For Healthcare) Does patient have an advance directive?: No Would patient like information on creating an advanced directive?: No - patient declined information    Additional Information 1:1 In Past 12 Months?: No CIRT Risk: No Elopement Risk: No Does patient have medical clearance?: No     Disposition:  Disposition Initial Assessment Completed for this Encounter: Yes  , D 08/20/2016 4:20 PM

## 2016-08-20 NOTE — ED Triage Notes (Signed)
BIB [facility] from home where pt lives alone. Pt had pulled alarm at her apartment and was yelling at people out in the street, they called 911. When EMS arrived she was walking around apartment fidgeting with papers and not making much sense but kept bringing up her "end of life care". Very rapid speech. Oriented to place but not year. CBG 131, HR 128 to 100, BP 160/60. EMS gave .4 Narcan in field.

## 2016-08-20 NOTE — ED Notes (Signed)
Pt noted to be talking to herself. Denies AH/VH. Pt alert, oriented. Pt denies SI at this time then states, "Well, I guess they'll just worry about stuff when I'm dead". Pt denies HI. Denies any c/o pain. Pt appears manic - rapid, repetitive speech. Lying on bed. Offered for pt to watch tv - declines.

## 2016-08-20 NOTE — ED Notes (Signed)
Pt ambulatory to C23 w/RN. Pt's belongings x 1 labeled belongings bag placed at nurses' desk. Pt wearing burgundy paper scrubs.

## 2016-08-20 NOTE — ED Notes (Addendum)
Resident at bedside.  

## 2016-08-20 NOTE — ED Provider Notes (Signed)
[facility] DEPT Provider Note  CSN: [id number] Arrival Date & Time: 08/20/16 @ 59  History    Chief Complaint Chief Complaint  Patient presents with  . Altered Mental Status    HPI Elizabeth Schwartz is a 70 y.o. female.  Patient presents by EMS after there was concern The patient was acting bizarrely earlier today when neighbors found patient frantically rummaging through her house and yelling outside of her door. Patient continually perseverates on end-of-life care to EMS who stated that she was not oriented to year however upon arrival he shouldn't is alert and oriented 3. Patient was given 4 mg Narcan at home due to concerns for pinpoint pupils. Upon examination of patient's pupils in room lights were turned off and patient had equal round reactive pupils.  During history of present illness patient has pressured speech and perseverates on end-of-life care along with several other mortalities in her family including her niece and son which happened several years ago per the patient. Unable to follow several other points the patient is attempting to make. Patient's speech is without dysarthria and she is not aphasic. Patient does have pressured speech however denies symptoms of chest pain shortness of breath visual change sensitivity light headache neck pain abdominal pain back pain or any urinary discomfort. Denies emesis or diarrhea or blood per stool.  Patient has previous medical history of DM, Schizophrenia, Depression, COPD and Anemia. Per review of patient's medication list she is taking donepezil, clozapine, which she states she's been on for several years. Patient also takes thiothixene. Patient endorses she was given medication of hydrocodone for tooth removal several years prior however is not currently taking.  Patient contact list which EMS provided had niece listed as first contact however we were unable to make contact and per contact with second listed spoke with a faint  appointments of the patient who attends the same church. She stated the patient called her earlier this morning with pressured speech and flight of ideas and requesting that she did patient's power of attorney. Because she did not know the patient adequately she stated that she did not feel comfortable in this role and the patient subsequently hung up.  Past Medical & Surgical History    Past Medical History:  Diagnosis Date  . Anemia   . COPD (chronic obstructive pulmonary disease) ([place])   . Depression   . Diabetes mellitus   . GERD (gastroesophageal reflux disease)   . Hyperlipidemia   . Hypertension   . Osteoporosis   . Schizophrenia, schizo-affective ([place])   . SOB (shortness of breath) on exertion    Patient Active Problem List   Diagnosis Date Noted  . Asthma 09/08/2011  . Diverticulosis 09/08/2011  . Smoker 09/08/2011  . GERD 06/04/2007   Past Surgical History:  Procedure Laterality Date  . LAPAROSCOPY  1975   ABD pain  . VAGINAL HYSTERECTOMY  1975    Family & Social History    No family history on file. Social History  Substance Use Topics  . Smoking status: Former Smoker    Years: 15.00    Types: Cigarettes    Quit date: 02/28/2012  . Smokeless tobacco: Former Systems developer    Types: Chew  . Alcohol use Yes     Comment: "socially"    Home Medications    Prior to Admission medications   Medication Sig Start Date End Date Taking? Authorizing Provider  albuterol (PROVENTIL) (2.5 MG/3ML) 0.083% nebulizer solution Take 2.5 mg by nebulization every 6 (six)  hours as needed. Shortness of breath   Yes Historical Provider, MD  alendronate (FOSAMAX) 35 MG tablet Take 35 mg by mouth every 7 (seven) days. Take with a full glass of water on an empty stomach. Takes on Mondays   Yes Historical Provider, MD  calcium carbonate (OS-CAL) 600 MG TABS Take 600 mg by mouth 2 (two) times daily with a meal.     Yes Historical Provider, MD  cloZAPine (CLOZARIL) 100 MG tablet Take 200 mg by  mouth at bedtime.    Yes Historical Provider, MD  donepezil (ARICEPT) 10 MG tablet Take 10 mg by mouth at bedtime.     Yes Historical Provider, MD  fenofibrate 160 MG tablet Take 160 mg by mouth daily.     Yes Historical Provider, MD  fish oil-omega-3 fatty acids 1000 MG capsule Take 1 g by mouth every morning.   Yes Historical Provider, MD  metFORMIN (GLUCOPHAGE) 500 MG tablet Take 500 mg by mouth 2 (two) times daily with a meal.    Yes Historical Provider, MD  omeprazole (PRILOSEC) 20 MG capsule Take 20 mg by mouth daily.   Yes Historical Provider, MD  simvastatin (ZOCOR) 40 MG tablet Take 40 mg by mouth daily.    Yes Historical Provider, MD  vitamin C (ASCORBIC ACID) 500 MG tablet Take 500 mg by mouth every morning.   Yes Historical Provider, MD  VITAMIN D, CHOLECALCIFEROL, PO Take 600 Units by mouth daily.     Yes Historical Provider, MD  albuterol (PROVENTIL HFA;VENTOLIN HFA) 108 (90 BASE) MCG/ACT inhaler Inhale 2 puffs into the lungs every 6 (six) hours as needed for wheezing. 09/01/11 08/31/12  Tanda Rockers, MD  budesonide-formoterol [facility]) 160-4.5 MCG/ACT inhaler Inhale 2 puffs into the lungs 2 (two) times daily. 10/11/11 10/10/13  Tanda Rockers, MD  famotidine (PEPCID) 20 MG tablet Take 1 tablet (20 mg total) by mouth 2 (two) times daily as needed for heartburn (indigestion). 12/08/11 12/07/12  Charlena Cross, MD  HYDROcodone-acetaminophen (NORCO/VICODIN) 5-325 MG per tablet Take 1 tablet by mouth every 8 (eight) hours as needed for pain. Patient not taking: Reported on 08/20/2016 12/04/12   Carmin Muskrat, MD  predniSONE (DELTASONE) 50 MG tablet Take 1 tablet (50 mg total) by mouth daily. Patient not taking: Reported on 08/20/2016 11/22/11   Marin Olp, MD    Allergies    Review of patient's allergies indicates no known allergies.  I reviewed & agree with nursing's documentation on the patient's past medical, surgical, social & family histories as well as their  allergies.  Review of Systems  Complete ROS obtained, and is negative except as stated in HPI.  Physical Exam  Updated Vital Signs BP 118/63   Pulse 87   Temp 97.9 F (36.6 C) (Rectal)   Resp 18   SpO2 97%  I have reviewed the triage vital signs and the nursing notes. Physical Exam CONST: Patient alert, well appearing, in no apparent distress, picking at clothes and appears slightly agitated but is redirectable.  EYES: PERRLA. EOMI. Conjunctiva w/o d/c. Lids AT w/o swelling.  ENMT: External Nares & Ears AT w/o swelling. Oropharynx patent. MM moist.  NECK: ROM full w/o rigidity. Trachea midline. JVD absent.  CVS: +S1/S2 w/o obvious murmur. Lower extremities w/o pitting edema.  RESP: Respiratory effort unlabored w/o retractions & accessory muscle use. BS clear bilaterally.  GI: Soft & ND. +BS x 4. TTP absent. Hernia absent. Guarding & Rebound absent.  BACK: CVA TTP absent bilaterally.  SKIN: Skin warm & dry. Turgor good. No rash.  PSYCH: Alert. Oriented. Affect and mood appropriate.  NEURO: CN II-XII grossly intact. Motor exam symmetric w/ upper & lower extremities 5/5 bilaterally. Sensation grossly intact.  MSK: Joints located & stable, w/o obvious dislocation & obvious deformity or crepitus absent w/ Cap refill < 2 sec. Peripheral pulses 2+ & equal in all extremities.   ED Treatments & Results   Labs (only abnormal results are displayed) Labs Reviewed  URINALYSIS, ROUTINE W REFLEX MICROSCOPIC (NOT AT [facility]) - Abnormal; Notable for the following:       Result Value   Specific Gravity, Urine <1.005 (*)    Leukocytes, UA SMALL (*)    All other components within normal limits  CBC WITH DIFFERENTIAL/PLATELET - Abnormal; Notable for the following:    RBC 3.39 (*)    Hemoglobin 9.7 (*)    HCT 31.9 (*)    All other components within normal limits  COMPREHENSIVE METABOLIC PANEL - Abnormal; Notable for the following:    Potassium 3.2 (*)    Glucose, Bld 135 (*)    Total Protein  6.0 (*)    Alkaline Phosphatase 31 (*)    All other components within normal limits  ACETAMINOPHEN LEVEL - Abnormal; Notable for the following:    Acetaminophen (Tylenol), Serum <10 (*)    All other components within normal limits  URINE MICROSCOPIC-ADD ON - Abnormal; Notable for the following:    Squamous Epithelial / LPF 0-5 (*)    Bacteria, UA RARE (*)    All other components within normal limits  CBG MONITORING, ED - Abnormal; Notable for the following:    Glucose-Capillary 127 (*)    All other components within normal limits  I-STAT CHEM 8, ED - Abnormal; Notable for the following:    Potassium 3.2 (*)    Glucose, Bld 130 (*)    Hemoglobin 10.2 (*)    HCT 30.0 (*)    All other components within normal limits  I-STAT VENOUS BLOOD GAS, ED - Abnormal; Notable for the following:    pCO2, Ven 39.2 (*)    Acid-base deficit 3.0 (*)    All other components within normal limits  URINE CULTURE  SALICYLATE LEVEL  URINE RAPID DRUG SCREEN, HOSP PERFORMED  I-STAT CG4 LACTIC ACID, ED    EKG    EKG Interpretation  Date/Time:  Saturday August 20 2016 15:17:54 EDT Ventricular Rate:  96 PR Interval:    QRS Duration: 108 QT Interval:  375 QTC Calculation: 474 R Axis:   -61 Text Interpretation:  Sinus rhythm Probable left atrial enlargement Left anterior fascicular block Nonspecific T wave abnormality No significant change since last tracing Confirmed by Ashok Cordia  MD, Lennette Bihari ([id number]) on 08/20/2016 3:31:45 PM       Radiology No results found.  Pertinent labs & imaging results that were available during my care of the patient were independently visualized by me and considered in my medical decision making, please see chart for details.  Procedures (including critical care time) Procedures  Medications Ordered in ED Medications - No data to display  Initial Impression & Plan / ED Course & Results / Final Disposition   Initial Impression & Plan Patient is alert and oriented to  person place and time however not situation. Patient appears well and nontoxic and vital signs are normal with rectal temperature is within normal limits. Do not have concern for sepsis or other serious bacterial infection however I considered delirium as etiology and  I obtained screening laboratory work, VBG, salicylate and Tylenol levels along with EKG and CT head and urine studies.  ED Course & Results EKG reviewed and I appreciate no evidence of acute STEMI advanced AV block. EKG is normal sinus rhythm with T-wave inversions in aVL however upon comparison with previous EKG on 12/08/2011 there is been no change in EKGs.  Patient's acetaminophen and salicylate level undetectable. CMP and CBC unremarkable except for hemoglobin of 9.7 and hematocrit of 31.9. Urinalysis reveals no obvious infection with only small leukocytes and no urinary white blood cells. Patient's urine drug screen reveals no metabolites.  I reviewed CT head and appreciate NAICA.  Due to patient's flight of ideas and pressured speech with perseveration concern for acute mania at this time versus medication side effect. Due to these concerns and patient's safety for self upon discharge I obtained consultation from the TTS who made decision to admit for inpatient psychiatric care per she meets gero-psych criteria for inpatient. TTS to seek placement.  Final Clinical Impression & ED Diagnoses   1. Schizophrenia, unspecified type [facility])    Patient care discussed with the attending physician, Dr. Ashok Cordia, who oversaw their evaluation & treatment & voiced agreement.  Note: This document was prepared using Dragon voice recognition software and may include unintentional dictation errors.  House Officer: Voncille Lo, MD, Emergency Medicine Resident.   Voncille Lo, MD 08/20/16 [place], MD 08/20/16 2201

## 2016-08-20 NOTE — ED Notes (Signed)
MD at bedside. 

## 2016-08-21 ENCOUNTER — Emergency Department (HOSPITAL_COMMUNITY): Payer: Medicare Other

## 2016-08-21 DIAGNOSIS — M25512 Pain in left shoulder: Secondary | ICD-10-CM | POA: Diagnosis not present

## 2016-08-21 DIAGNOSIS — R410 Disorientation, unspecified: Secondary | ICD-10-CM | POA: Diagnosis not present

## 2016-08-21 DIAGNOSIS — Z7983 Long term (current) use of bisphosphonates: Secondary | ICD-10-CM | POA: Diagnosis not present

## 2016-08-21 DIAGNOSIS — F209 Schizophrenia, unspecified: Secondary | ICD-10-CM | POA: Diagnosis present

## 2016-08-21 DIAGNOSIS — Z79899 Other long term (current) drug therapy: Secondary | ICD-10-CM | POA: Diagnosis not present

## 2016-08-21 DIAGNOSIS — D649 Anemia, unspecified: Secondary | ICD-10-CM | POA: Diagnosis not present

## 2016-08-21 DIAGNOSIS — R21 Rash and other nonspecific skin eruption: Secondary | ICD-10-CM | POA: Diagnosis not present

## 2016-08-21 DIAGNOSIS — K219 Gastro-esophageal reflux disease without esophagitis: Secondary | ICD-10-CM | POA: Diagnosis not present

## 2016-08-21 DIAGNOSIS — E785 Hyperlipidemia, unspecified: Secondary | ICD-10-CM | POA: Diagnosis present

## 2016-08-21 DIAGNOSIS — R4182 Altered mental status, unspecified: Secondary | ICD-10-CM | POA: Diagnosis not present

## 2016-08-21 DIAGNOSIS — R296 Repeated falls: Secondary | ICD-10-CM | POA: Diagnosis not present

## 2016-08-21 DIAGNOSIS — S4992XA Unspecified injury of left shoulder and upper arm, initial encounter: Secondary | ICD-10-CM | POA: Diagnosis not present

## 2016-08-21 DIAGNOSIS — M25532 Pain in left wrist: Secondary | ICD-10-CM | POA: Diagnosis not present

## 2016-08-21 DIAGNOSIS — Z7951 Long term (current) use of inhaled steroids: Secondary | ICD-10-CM | POA: Diagnosis not present

## 2016-08-21 DIAGNOSIS — F0391 Unspecified dementia with behavioral disturbance: Secondary | ICD-10-CM | POA: Diagnosis present

## 2016-08-21 DIAGNOSIS — Z87891 Personal history of nicotine dependence: Secondary | ICD-10-CM | POA: Diagnosis not present

## 2016-08-21 DIAGNOSIS — E119 Type 2 diabetes mellitus without complications: Secondary | ICD-10-CM | POA: Diagnosis present

## 2016-08-21 DIAGNOSIS — I517 Cardiomegaly: Secondary | ICD-10-CM | POA: Diagnosis not present

## 2016-08-21 DIAGNOSIS — Z7984 Long term (current) use of oral hypoglycemic drugs: Secondary | ICD-10-CM | POA: Diagnosis not present

## 2016-08-21 DIAGNOSIS — S6992XA Unspecified injury of left wrist, hand and finger(s), initial encounter: Secondary | ICD-10-CM | POA: Diagnosis not present

## 2016-08-21 DIAGNOSIS — M810 Age-related osteoporosis without current pathological fracture: Secondary | ICD-10-CM | POA: Diagnosis present

## 2016-08-21 DIAGNOSIS — F419 Anxiety disorder, unspecified: Secondary | ICD-10-CM | POA: Diagnosis not present

## 2016-08-21 DIAGNOSIS — J449 Chronic obstructive pulmonary disease, unspecified: Secondary | ICD-10-CM | POA: Diagnosis not present

## 2016-08-21 DIAGNOSIS — I10 Essential (primary) hypertension: Secondary | ICD-10-CM | POA: Diagnosis not present

## 2016-08-21 DIAGNOSIS — Z9183 Wandering in diseases classified elsewhere: Secondary | ICD-10-CM | POA: Diagnosis not present

## 2016-08-21 LAB — URINE CULTURE

## 2016-08-21 LAB — TSH: TSH: 0.816 u[IU]/mL (ref 0.350–4.500)

## 2016-08-21 MED ORDER — POTASSIUM CHLORIDE CRYS ER 20 MEQ PO TBCR
40.0000 meq | EXTENDED_RELEASE_TABLET | Freq: Once | ORAL | Status: AC
  Administered 2016-08-21: 40 meq via ORAL
  Filled 2016-08-21: qty 2

## 2016-08-21 MED ORDER — SIMVASTATIN 40 MG PO TABS
40.0000 mg | ORAL_TABLET | Freq: Every day | ORAL | Status: DC
  Filled 2016-08-21: qty 1

## 2016-08-21 NOTE — ED Notes (Signed)
Offered toileting to pt - declined.

## 2016-08-21 NOTE — ED Notes (Signed)
IVC papers being served.

## 2016-08-21 NOTE — ED Notes (Signed)
Copy of IVC papers faxed to Elizabeth Schwartz, copy sent to Medical Records, original affidavit & 1st exam placed in folder for Magistrate.

## 2016-08-21 NOTE — ED Notes (Signed)
Pt lying on bed talking to herself. Pt yelled out x 1 "boo!"

## 2016-08-21 NOTE — ED Notes (Signed)
Attempted to call [facility] x 2 - no answer.

## 2016-08-21 NOTE — ED Notes (Signed)
Faxed CXR result and MAR report to [facility] [phone] - as requested. Shirlee Limerick advised will notify RN of accepting dr when receives copy of IVC paperwork. Also requesting TSH to be sent when receive.

## 2016-08-21 NOTE — ED Notes (Signed)
Left message for sheriff's deputy notifying pt will need transport to [place] when receive IVC paperwork.

## 2016-08-21 NOTE — ED Provider Notes (Signed)
Patient was accepted to psychiatric facility by Dr Ananias Pilgrim. Pt is medically stable, no acute distress on exam and able to be transferred safely to facility capable of providing appropriate care.    Leo Grosser, MD 08/21/16 [phone]

## 2016-08-21 NOTE — ED Notes (Addendum)
Dr Laneta Simmers aware Shirlee Limerick at [facility] has accepted pt however pt needs CXR, TSH, Potassium, and IVC. RN to call Shirlee Limerick back at [phone] when receive CXR result and papers.

## 2016-08-21 NOTE — ED Notes (Signed)
Dr Laneta Simmers in w/pt.

## 2016-08-21 NOTE — Progress Notes (Signed)
Disposition CSW completed patient referrals for the following inpatient psych facilities:  [facility]  CSW will continue to follow patient for placement needs.  [place] Disposition CSW [phone]

## 2016-08-21 NOTE — ED Notes (Signed)
States was declared incompetent in the past. States has hx of multiple psych admissions - she believes last was at "[employer]". Thinks her symptoms are related to her receiving "shingles vaccine last Monday". Pt will attempt to respond to questions asked then loses her "train of thought" and asks for RN to repeat questions - sometimes 2-3 times before she is able to answer. Pt states she does not want to lose her apartment - unable to provide name of apartment/assisted living facility. Pt unable to recall symptoms she was displaying that caused her to be brought to ED. Pt is pleasant, cooperative.

## 2016-08-21 NOTE — ED Notes (Signed)
Discharged pt in error - "undischarged".

## 2016-08-22 DIAGNOSIS — F0281 Dementia in other diseases classified elsewhere with behavioral disturbance: Secondary | ICD-10-CM | POA: Insufficient documentation

## 2016-08-22 DIAGNOSIS — F02818 Dementia in other diseases classified elsewhere, unspecified severity, with other behavioral disturbance: Secondary | ICD-10-CM | POA: Insufficient documentation

## 2016-08-22 DIAGNOSIS — I10 Essential (primary) hypertension: Secondary | ICD-10-CM | POA: Insufficient documentation

## 2016-08-22 DIAGNOSIS — J449 Chronic obstructive pulmonary disease, unspecified: Secondary | ICD-10-CM | POA: Insufficient documentation

## 2016-08-22 DIAGNOSIS — D649 Anemia, unspecified: Secondary | ICD-10-CM | POA: Insufficient documentation

## 2016-08-22 DIAGNOSIS — M810 Age-related osteoporosis without current pathological fracture: Secondary | ICD-10-CM | POA: Insufficient documentation

## 2016-09-12 DIAGNOSIS — F209 Schizophrenia, unspecified: Secondary | ICD-10-CM | POA: Diagnosis not present

## 2016-09-15 DIAGNOSIS — F209 Schizophrenia, unspecified: Secondary | ICD-10-CM | POA: Diagnosis not present

## 2016-09-21 DIAGNOSIS — F2089 Other schizophrenia: Secondary | ICD-10-CM | POA: Diagnosis not present

## 2016-09-21 DIAGNOSIS — J449 Chronic obstructive pulmonary disease, unspecified: Secondary | ICD-10-CM | POA: Diagnosis not present

## 2016-09-21 DIAGNOSIS — E119 Type 2 diabetes mellitus without complications: Secondary | ICD-10-CM | POA: Diagnosis not present

## 2016-09-21 DIAGNOSIS — K219 Gastro-esophageal reflux disease without esophagitis: Secondary | ICD-10-CM | POA: Diagnosis not present

## 2016-10-03 DIAGNOSIS — F209 Schizophrenia, unspecified: Secondary | ICD-10-CM | POA: Diagnosis not present

## 2016-10-18 DIAGNOSIS — J449 Chronic obstructive pulmonary disease, unspecified: Secondary | ICD-10-CM | POA: Diagnosis not present

## 2016-10-18 DIAGNOSIS — F2089 Other schizophrenia: Secondary | ICD-10-CM | POA: Diagnosis not present

## 2016-10-18 DIAGNOSIS — E119 Type 2 diabetes mellitus without complications: Secondary | ICD-10-CM | POA: Diagnosis not present

## 2016-10-18 DIAGNOSIS — S90413A Abrasion, unspecified great toe, initial encounter: Secondary | ICD-10-CM | POA: Diagnosis not present

## 2016-10-28 DIAGNOSIS — F209 Schizophrenia, unspecified: Secondary | ICD-10-CM | POA: Diagnosis not present

## 2016-11-09 DIAGNOSIS — F209 Schizophrenia, unspecified: Secondary | ICD-10-CM | POA: Diagnosis not present

## 2017-05-10 ENCOUNTER — Other Ambulatory Visit: Payer: Self-pay | Admitting: Internal Medicine

## 2017-05-10 DIAGNOSIS — E2839 Other primary ovarian failure: Secondary | ICD-10-CM

## 2017-06-01 ENCOUNTER — Other Ambulatory Visit: Payer: Self-pay | Admitting: Internal Medicine

## 2017-06-01 DIAGNOSIS — Z1231 Encounter for screening mammogram for malignant neoplasm of breast: Secondary | ICD-10-CM

## 2017-07-07 ENCOUNTER — Ambulatory Visit
Admission: RE | Admit: 2017-07-07 | Discharge: 2017-07-07 | Disposition: A | Discharge status: Home / Self Care | Payer: Medicare Other | Source: Ambulatory Visit | Attending: Internal Medicine | Admitting: Internal Medicine

## 2017-07-07 DIAGNOSIS — E2839 Other primary ovarian failure: Secondary | ICD-10-CM

## 2017-07-07 DIAGNOSIS — Z1231 Encounter for screening mammogram for malignant neoplasm of breast: Secondary | ICD-10-CM

## 2017-07-28 ENCOUNTER — Other Ambulatory Visit: Payer: Medicare Other

## 2017-07-28 ENCOUNTER — Ambulatory Visit: Payer: Medicare Other

## 2018-05-28 ENCOUNTER — Other Ambulatory Visit: Payer: Self-pay | Admitting: Internal Medicine

## 2018-05-28 DIAGNOSIS — Z1231 Encounter for screening mammogram for malignant neoplasm of breast: Secondary | ICD-10-CM

## 2018-07-10 ENCOUNTER — Ambulatory Visit: Payer: Medicare Other

## 2018-07-10 ENCOUNTER — Ambulatory Visit
Admission: RE | Admit: 2018-07-10 | Discharge: 2018-07-10 | Disposition: A | Discharge status: Home / Self Care | Payer: Medicare Other | Source: Ambulatory Visit | Attending: Internal Medicine | Admitting: Internal Medicine

## 2018-07-10 DIAGNOSIS — Z1231 Encounter for screening mammogram for malignant neoplasm of breast: Secondary | ICD-10-CM

## 2018-07-17 ENCOUNTER — Ambulatory Visit: Payer: Medicare Other

## 2018-07-20 ENCOUNTER — Ambulatory Visit: Payer: Medicare Other

## 2019-03-01 ENCOUNTER — Encounter (HOSPITAL_COMMUNITY): Payer: Self-pay

## 2019-03-01 ENCOUNTER — Emergency Department (HOSPITAL_COMMUNITY)
Admission: EM | Admit: 2019-03-01 | Discharge: 2019-03-01 | Disposition: A | Discharge status: Home / Self Care | Payer: Medicare Other | Attending: Emergency Medicine | Admitting: Emergency Medicine

## 2019-03-01 DIAGNOSIS — R197 Diarrhea, unspecified: Secondary | ICD-10-CM | POA: Insufficient documentation

## 2019-03-01 DIAGNOSIS — E119 Type 2 diabetes mellitus without complications: Secondary | ICD-10-CM | POA: Insufficient documentation

## 2019-03-01 DIAGNOSIS — I10 Essential (primary) hypertension: Secondary | ICD-10-CM | POA: Insufficient documentation

## 2019-03-01 DIAGNOSIS — F259 Schizoaffective disorder, unspecified: Secondary | ICD-10-CM | POA: Diagnosis not present

## 2019-03-01 DIAGNOSIS — Z79899 Other long term (current) drug therapy: Secondary | ICD-10-CM | POA: Diagnosis not present

## 2019-03-01 DIAGNOSIS — J449 Chronic obstructive pulmonary disease, unspecified: Secondary | ICD-10-CM | POA: Diagnosis not present

## 2019-03-01 DIAGNOSIS — Z79891 Long term (current) use of opiate analgesic: Secondary | ICD-10-CM | POA: Diagnosis not present

## 2019-03-01 DIAGNOSIS — R55 Syncope and collapse: Secondary | ICD-10-CM | POA: Diagnosis not present

## 2019-03-01 LAB — URINALYSIS, ROUTINE W REFLEX MICROSCOPIC
Bilirubin Urine: NEGATIVE
Glucose, UA: NEGATIVE mg/dL
Hgb urine dipstick: NEGATIVE
Ketones, ur: NEGATIVE mg/dL
Nitrite: NEGATIVE
Protein, ur: 100 mg/dL — AB
Specific Gravity, Urine: 1.02 (ref 1.005–1.030)
Trans Epithel, UA: 2
pH: 5 (ref 5.0–8.0)

## 2019-03-01 LAB — CBC
HCT: 36.4 % (ref 36.0–46.0)
Hemoglobin: 11.1 g/dL — ABNORMAL LOW (ref 12.0–15.0)
MCH: 28.3 pg (ref 26.0–34.0)
MCHC: 30.5 g/dL (ref 30.0–36.0)
MCV: 92.9 fL (ref 80.0–100.0)
Platelets: 253 10*3/uL (ref 150–400)
RBC: 3.92 MIL/uL (ref 3.87–5.11)
RDW: 14.8 % (ref 11.5–15.5)
WBC: 7.4 10*3/uL (ref 4.0–10.5)
nRBC: 0 % (ref 0.0–0.2)

## 2019-03-01 LAB — BASIC METABOLIC PANEL
Anion gap: 15 (ref 5–15)
BUN: 18 mg/dL (ref 8–23)
CO2: 20 mmol/L — ABNORMAL LOW (ref 22–32)
Calcium: 9.5 mg/dL (ref 8.9–10.3)
Chloride: 106 mmol/L (ref 98–111)
Creatinine, Ser: 1.41 mg/dL — ABNORMAL HIGH (ref 0.44–1.00)
GFR calc Af Amer: 43 mL/min — ABNORMAL LOW (ref >60)
GFR calc non Af Amer: 37 mL/min — ABNORMAL LOW (ref >60)
Glucose, Bld: 148 mg/dL — ABNORMAL HIGH (ref 70–99)
Potassium: 4.2 mmol/L (ref 3.5–5.1)
Sodium: 141 mmol/L (ref 135–145)

## 2019-03-01 MED ORDER — SODIUM CHLORIDE 0.9% FLUSH
3.0000 mL | Freq: Once | INTRAVENOUS | Status: AC
  Administered 2019-03-01: 21:00:00 3 mL via INTRAVENOUS

## 2019-03-01 NOTE — ED Provider Notes (Signed)
[place] EMERGENCY DEPARTMENT Provider Note   CSN: [id number] Arrival date & time: 03/01/19  1534    History   Chief Complaint Chief Complaint  Patient presents with  . Near Syncope    HPI Elizabeth Schwartz is a 73 y.o. female.     The history is provided by the patient and medical records. No language interpreter was used.  Near Syncope  This is a new problem. The current episode started 1 to 2 hours ago. The problem occurs rarely. The problem has been resolved. Pertinent negatives include no chest pain, no abdominal pain, no headaches and no shortness of breath. Nothing (bearing dpwm) aggravates the symptoms. Nothing relieves the symptoms. She has tried nothing for the symptoms. The treatment provided no relief.    Past Medical History:  Diagnosis Date  . Anemia   . COPD (chronic obstructive pulmonary disease) ([place])   . Depression   . Diabetes mellitus   . GERD (gastroesophageal reflux disease)   . Hyperlipidemia   . Hypertension   . Osteoporosis   . Schizophrenia, schizo-affective ([place])   . SOB (shortness of breath) on exertion     Patient Active Problem List   Diagnosis Date Noted  . Asthma 09/08/2011  . Diverticulosis 09/08/2011  . Smoker 09/08/2011  . GERD 06/04/2007    Past Surgical History:  Procedure Laterality Date  . BREAST BIOPSY Right 2017   benign  . LAPAROSCOPY  1975   ABD pain  . VAGINAL HYSTERECTOMY  1975     OB History   No obstetric history on file.      Home Medications    Prior to Admission medications   Medication Sig Start Date End Date Taking? Authorizing Provider  albuterol (PROVENTIL HFA;VENTOLIN HFA) 108 (90 BASE) MCG/ACT inhaler Inhale 2 puffs into the lungs every 6 (six) hours as needed for wheezing. 09/01/11 08/31/12  Tanda Rockers, MD  albuterol (PROVENTIL) (2.5 MG/3ML) 0.083% nebulizer solution Take 2.5 mg by nebulization every 6 (six) hours as needed. Shortness of breath    [provider]   alendronate (FOSAMAX) 35 MG tablet Take 35 mg by mouth every 7 (seven) days. Take with a full glass of water on an empty stomach. Takes on Mondays    [provider]  budesonide-formoterol (SYMBICORT) 160-4.5 MCG/ACT inhaler Inhale 2 puffs into the lungs 2 (two) times daily. 10/11/11 10/10/13  Tanda Rockers, MD  calcium carbonate (OS-CAL) 600 MG TABS Take 600 mg by mouth 2 (two) times daily with a meal.      [provider]  cloZAPine (CLOZARIL) 100 MG tablet Take 200 mg by mouth at bedtime.     [provider]  donepezil (ARICEPT) 10 MG tablet Take 10 mg by mouth at bedtime.      [provider]  famotidine (PEPCID) 20 MG tablet Take 1 tablet (20 mg total) by mouth 2 (two) times daily as needed for heartburn (indigestion). 12/08/11 12/07/12  Charlena Cross, MD  fenofibrate 160 MG tablet Take 160 mg by mouth daily.      [provider]  fish oil-omega-3 fatty acids 1000 MG capsule Take 1 g by mouth every morning.    [provider]  HYDROcodone-acetaminophen (NORCO/VICODIN) 5-325 MG per tablet Take 1 tablet by mouth every 8 (eight) hours as needed for pain. Patient not taking: Reported on 08/20/2016 12/04/12   Carmin Muskrat, MD  metFORMIN (GLUCOPHAGE) 500 MG tablet Take 500 mg by mouth 2 (two) times daily  with a meal.     [provider]  omeprazole (PRILOSEC) 20 MG capsule Take 20 mg by mouth daily.    [provider]  predniSONE (DELTASONE) 50 MG tablet Take 1 tablet (50 mg total) by mouth daily. Patient not taking: Reported on 08/20/2016 11/22/11   Marin Olp, MD  simvastatin (ZOCOR) 40 MG tablet Take 40 mg by mouth daily.     [provider]  vitamin C (ASCORBIC ACID) 500 MG tablet Take 500 mg by mouth every morning.    [provider]  VITAMIN D, CHOLECALCIFEROL, PO Take 600 Units by mouth daily.      [provider]    Family History No family history on file.  Social History  Social History   Tobacco Use  . Smoking status: Former Smoker    Years: 15.00    Types: Cigarettes    Last attempt to quit: 02/28/2012    Years since quitting: 7.0  . Smokeless tobacco: Former Systems developer    Types: Chew  Substance Use Topics  . Alcohol use: Yes    Comment: "socially"  . Drug use: No     Allergies   Patient has no known allergies.   Review of Systems Review of Systems  Constitutional: Negative for chills, diaphoresis, fatigue and fever.  HENT: Negative for congestion.   Eyes: Negative for visual disturbance.  Respiratory: Negative for cough, chest tightness and shortness of breath.   Cardiovascular: Positive for near-syncope. Negative for chest pain and palpitations.  Gastrointestinal: Positive for diarrhea. Negative for abdominal pain, constipation, nausea and vomiting.  Genitourinary: Negative for dysuria, flank pain and frequency.  Musculoskeletal: Negative for back pain, neck pain and neck stiffness.  Skin: Negative for rash and wound.  Neurological: Positive for light-headedness. Negative for dizziness, seizures, syncope, speech difficulty, weakness, numbness and headaches.  All other systems reviewed and are negative.    Physical Exam Updated Vital Signs BP 114/78 (BP Location: Right Arm)   Pulse 99   Temp 97.7 F (36.5 C) (Oral)   Resp 16   SpO2 97%   Physical Exam Vitals signs and nursing note reviewed.  Constitutional:      General: She is not in acute distress.    Appearance: Normal appearance. She is well-developed. She is not ill-appearing, toxic-appearing or diaphoretic.  HENT:     Head: Normocephalic and atraumatic.     Right Ear: External ear normal.     Left Ear: External ear normal.     Nose: Nose normal. No congestion or rhinorrhea.     Mouth/Throat:     Mouth: Mucous membranes are moist.     Pharynx: No oropharyngeal exudate or posterior oropharyngeal erythema.  Eyes:     Conjunctiva/sclera: Conjunctivae normal.     Pupils:  Pupils are equal, round, and reactive to light.  Neck:     Musculoskeletal: Normal range of motion and neck supple. No muscular tenderness.  Cardiovascular:     Rate and Rhythm: Normal rate.     Pulses: Normal pulses.     Heart sounds: No murmur.  Pulmonary:     Effort: No respiratory distress.     Breath sounds: No stridor. No wheezing, rhonchi or rales.  Chest:     Chest wall: No tenderness.  Abdominal:     General: Abdomen is flat. There is no distension.     Tenderness: There is no abdominal tenderness. There is no right CVA tenderness, left CVA tenderness or rebound.  Musculoskeletal:  General: No tenderness.     Right lower leg: No edema.     Left lower leg: No edema.  Skin:    General: Skin is warm.     Capillary Refill: Capillary refill takes less than 2 seconds.     Findings: No erythema or rash.  Neurological:     General: No focal deficit present.     Mental Status: She is alert and oriented to person, place, and time.     Cranial Nerves: No cranial nerve deficit.     Sensory: No sensory deficit.     Motor: No weakness or abnormal muscle tone.     Deep Tendon Reflexes: Reflexes are normal and symmetric.  Psychiatric:        Mood and Affect: Mood normal.      ED Treatments / Results  Labs (all labs ordered are listed, but only abnormal results are displayed) Labs Reviewed  BASIC METABOLIC PANEL - Abnormal; Notable for the following components:      Result Value   CO2 20 (*)    Glucose, Bld 148 (*)    Creatinine, Ser 1.41 (*)    GFR calc non Af Amer 37 (*)    GFR calc Af Amer 43 (*)    All other components within normal limits  CBC - Abnormal; Notable for the following components:   Hemoglobin 11.1 (*)    All other components within normal limits  URINALYSIS, ROUTINE W REFLEX MICROSCOPIC - Abnormal; Notable for the following components:   Color, Urine AMBER (*)    APPearance CLOUDY (*)    Protein, ur 100 (*)    Leukocytes,Ua MODERATE (*)     Bacteria, UA MANY (*)    All other components within normal limits  CBG MONITORING, ED    EKG EKG Interpretation  Date/Time:  Friday March 01 2019 15:47:08 EDT Ventricular Rate:  100 PR Interval:  162 QRS Duration: 98 QT Interval:  364 QTC Calculation: 469 R Axis:   -68 Text Interpretation:  Normal sinus rhythm Left anterior fascicular block Minimal voltage criteria for LVH, may be normal variant Possible Anterolateral infarct , age undetermined Abnormal ECG When compared to prior, no significant cahnges seen.  No STEMI Confirmed by Antony Blackbird [phone]) on 03/01/2019 8:28:36 PM   Radiology No results found.  Procedures Procedures (including critical care time)  Medications Ordered in ED Medications  sodium chloride flush (NS) 0.9 % injection 3 mL (3 mLs Intravenous Given 03/01/19 2112)     Initial Impression / Assessment and Plan / ED Course  I have reviewed the triage vital signs and the nursing notes.  Pertinent labs & imaging results that were available during my care of the patient were reviewed by me and considered in my medical decision making (see chart for details).        Elizabeth Schwartz is a 73 y.o. female with a past medical history significant for hypertension, hyperlipidemia, diabetes, COPD, schizophrenia, anemia, GERD, and osteoporosis who presents for lightheadedness episode.  Patient reports that she was at [facility] and has had several episodes of diarrhea today.  She reports that she just had a bowel movement and then while walking through the store had the sudden urge to need to have a bowel movement.  She reports that she was bearing down trying to hold on her bowel movement when she got lightheaded and had to lay down.  She then had a bowel movement on herself but did not pass out.  She denies  any associated palpitations, shortness of breath, or chest pain.  She reports that she otherwise was feeling well.  She denies nausea vomiting fevers, chills, cough,  urinary symptoms, or seizure-like activity.  She thinks it may be slightly dehydrated but otherwise is feeling well.  On exam, lungs clear chest is nontender.  Abdomen is nontender.  Normal bowel sounds.  Legs are nonedematous and nontender.  EKG shows no significant arrhythmia or STEMI.  Patient exam unremarkable.  Suspect patient had a near syncopal episode due to the straining to hold on a bowel movement when she had a vagal episode.  Patient was monitored for several hours with telemetry with no significant arrhythmias or abnormality.  Patient was not hypotensive and was able to eat and drink without difficulty.  Laboratory testing was collected which was reassuring.  Patient fervently denies any urinary symptoms such as change in urine amount, dysuria, hematuria, or smell.  Patient's urine did show  Leukocytes and bacteria however given lack of symptoms a low suspicion for UTI.  Patient was given oral fluids and food rather than IV fluids initially.   Given patient's reassuring monitoring and reassuring work-up we feel she safe for discharge home.  Patient was able to eat and drink and wants to go home.  Patient will follow-up with PCP and will push hydration.  She had no other questions or concerns and was discharged in good condition.   Final Clinical Impressions(s) / ED Diagnoses   Final diagnoses:  Near syncope  Diarrhea, unspecified type    ED Discharge Orders    None     Clinical Impression: 1. Near syncope   2. Diarrhea, unspecified type     Disposition: Discharge  Condition: Good  I have discussed the results, Dx and Tx plan with the pt(& family if present). He/she/they expressed understanding and agree(s) with the plan. Discharge instructions discussed at great length. Strict return precautions discussed and pt &/or family have verbalized understanding of the instructions. No further questions at time of discharge.    Discharge Medication List as of 03/01/2019 11:18  PM      Follow Up: [place] [id number] [phone] Schedule an appointment as soon as possible for a visit     [address] [phone] [place]       , Gwenyth Allegra, MD 03/02/19 [phone]

## 2019-03-01 NOTE — ED Triage Notes (Signed)
Pt brought in by [facility] from [facility] for sudden onset of weakness. Pt denies falling, states she "helped herself to the ground". Pt incontinent of stool and urine. Pt negative on stroke screen with EMS. Pt states she had a recent bowel movement and felt as though she needed to have another one when she felt the sudden onset of weakness. Pt currently A+Ox4.

## 2019-03-01 NOTE — Discharge Instructions (Signed)
Your work-up today was overall reassuring.  We suspect your near syncopal episode was due to your straining as you are trying to prevent a bowel movement with your diarrhea today.  You had no significant arrhythmias or abnormalities on your telemetry during the several hours of monitoring in the emergency department.  Your labs were reassuring.  You had no concerning symptoms of chest pain, palpitations or shortness of breath associated with your lightheadedness.  Has you were able to eat and drink and has appeared well, we feel you are safe for discharge home.  Please follow-up with your primary care physician and if any symptoms change or worsen, return to the nearest emergency department.

## 2019-03-14 ENCOUNTER — Ambulatory Visit: Payer: Medicare Other | Attending: Family Medicine | Admitting: Physician Assistant

## 2019-03-14 ENCOUNTER — Other Ambulatory Visit: Payer: Self-pay

## 2019-03-20 ENCOUNTER — Other Ambulatory Visit: Payer: Self-pay

## 2019-03-20 ENCOUNTER — Ambulatory Visit: Payer: Medicare Other | Attending: Primary Care | Admitting: Primary Care

## 2019-03-20 ENCOUNTER — Encounter: Payer: Self-pay | Admitting: Primary Care

## 2019-03-20 VITALS — BP 110/70 | HR 114 | Temp 98.7°F

## 2019-03-20 DIAGNOSIS — R42 Dizziness and giddiness: Secondary | ICD-10-CM

## 2019-03-20 DIAGNOSIS — E860 Dehydration: Secondary | ICD-10-CM

## 2019-03-20 DIAGNOSIS — Z7689 Persons encountering health services in other specified circumstances: Secondary | ICD-10-CM

## 2019-03-20 DIAGNOSIS — K529 Noninfective gastroenteritis and colitis, unspecified: Secondary | ICD-10-CM | POA: Insufficient documentation

## 2019-03-20 DIAGNOSIS — F209 Schizophrenia, unspecified: Secondary | ICD-10-CM | POA: Diagnosis not present

## 2019-03-20 DIAGNOSIS — I10 Essential (primary) hypertension: Secondary | ICD-10-CM | POA: Diagnosis not present

## 2019-03-20 DIAGNOSIS — R197 Diarrhea, unspecified: Secondary | ICD-10-CM | POA: Insufficient documentation

## 2019-03-20 NOTE — Progress Notes (Signed)
Established Patient Office Visit  Subjective:  Patient ID: Elizabeth Schwartz, female    DOB: May 27, 1946  Age: 73 y.o. MRN: [id number]  CC:  Chief Complaint  Patient presents with  . Hospitalization Follow-up    HPI Elizabeth Schwartz presents to establish care and was seen in ED. Difficulty with trying to do a tele visit despite nurse gave her the phone and called for her, some how she would get disconncted. Seen and evaluated in her car. This was done because of sysptoms of n/v and diarrhea is a COVID risk for in person. Her lips were parched and tongue dry poor turgor due to age and probable dehydration. Seen in ED for Near Syncope this was a new problem. The current episode started 1 to 2 hours ago on 03/01/2019. Diarrhea has been 3-4 loose stools for the last 3-4 days. Encourage to go to ED for further evaluation she refused due to the fear of getting COVID. Advised to drink 64 oz of fluids and or Gatorade. Patient agreed. Past medical history significant for hypertension, hyperlipidemia, diabetes, COPD, schizophrenia, anemia, GERD, and osteoporosis .  Past Medical History:  Diagnosis Date  . Anemia   . COPD (chronic obstructive pulmonary disease) ([place])   . Depression   . Diabetes mellitus   . GERD (gastroesophageal reflux disease)   . Hyperlipidemia   . Hypertension   . Osteoporosis   . Schizophrenia, schizo-affective ([place])   . SOB (shortness of breath) on exertion     Past Surgical History:  Procedure Laterality Date  . BREAST BIOPSY Right 2017   benign  . LAPAROSCOPY  1975   ABD pain  . VAGINAL HYSTERECTOMY  1975    History reviewed. No pertinent family history.  Social History   Socioeconomic History  . Marital status: Single    Spouse name: Not on file  . Number of children: Not on file  . Years of education: Not on file  . Highest education level: Not on file  Occupational History  . Not on file  Social Needs  . Financial resource strain: Not on file  . Food  insecurity:    Worry: Not on file    Inability: Not on file  . Transportation needs:    Medical: Not on file    Non-medical: Not on file  Tobacco Use  . Smoking status: Former Smoker    Years: 15.00    Types: Cigarettes    Last attempt to quit: 02/28/2012    Years since quitting: 7.0  . Smokeless tobacco: Former Systems developer    Types: Chew  Substance and Sexual Activity  . Alcohol use: Yes    Comment: "socially"  . Drug use: No  . Sexual activity: Not Currently  Lifestyle  . Physical activity:    Days per week: Not on file    Minutes per session: Not on file  . Stress: Not on file  Relationships  . Social connections:    Talks on phone: Not on file    Gets together: Not on file    Attends religious service: Not on file    Active member of club or organization: Not on file    Attends meetings of clubs or organizations: Not on file    Relationship status: Not on file  . Intimate partner violence:    Fear of current or ex partner: Not on file    Emotionally abused: Not on file    Physically abused: Not on file    Forced sexual  activity: Not on file  Other Topics Concern  . Not on file  Social History Narrative  . Not on file    Outpatient Medications Prior to Visit  Medication Sig Dispense Refill  . alendronate (FOSAMAX) 70 MG tablet Take 1 tablet by mouth once a week.    . calcium carbonate (OS-CAL) 600 MG TABS Take 600 mg by mouth 2 (two) times daily with a meal.      . clozapine (CLOZARIL) 200 MG tablet Take 1 tablet by mouth at bedtime.    . donepezil (ARICEPT) 10 MG tablet Take 10 mg by mouth at bedtime.      . famotidine (PEPCID) 20 MG tablet Take 1 tablet by mouth 2 (two) times daily.    . fenofibrate 160 MG tablet Take 160 mg by mouth daily.      . fish oil-omega-3 fatty acids 1000 MG capsule Take 1 g by mouth every morning.    . metFORMIN (GLUCOPHAGE) 500 MG tablet Take 500 mg by mouth 2 (two) times daily with a meal.     . simvastatin (ZOCOR) 40 MG tablet Take 40 mg  by mouth daily.     . SYMBICORT 160-4.5 MCG/ACT inhaler Inhale 2 puffs into the lungs 2 (two) times daily.    . vitamin C (ASCORBIC ACID) 500 MG tablet Take 500 mg by mouth every morning.    Marland Kitchen VITAMIN D, CHOLECALCIFEROL, PO Take 600 Units by mouth daily.       No facility-administered medications prior to visit.     No Known Allergies  ROS Review of Systems  Constitutional: Positive for fatigue.  HENT: Negative.   Eyes: Negative.   Respiratory: Negative.   Gastrointestinal: Positive for abdominal distention, abdominal pain and diarrhea.  Endocrine: Negative.   Genitourinary: Negative.   Skin:       Poor turgor   Allergic/Immunologic: Negative.   Neurological: Positive for dizziness and weakness.  Psychiatric/Behavioral: Negative.       Objective:    Physical Exam  Constitutional: She is oriented to person, place, and time. She appears well-developed.  HENT:  Head: Normocephalic.  Eyes: EOM are normal.  Neck: Normal range of motion. Neck supple.  Cardiovascular: Normal rate and regular rhythm.  Pulmonary/Chest: Effort normal and breath sounds normal.  Abdominal: Soft. Bowel sounds are normal. There is abdominal tenderness.  Neurological: She is oriented to person, place, and time.  Skin: Skin is warm and dry.  Psychiatric: She has a normal mood and affect.    BP 110/70 (BP Location: Left Arm, Patient Position: Sitting, Cuff Size: Normal)   Pulse (!) 114   Temp 98.7 F (37.1 C) (Oral)  Wt Readings from Last 3 Encounters:  06/29/12 163 lb 6.4 oz (74.1 kg)  11/20/11 164 lb 7.4 oz (74.6 kg)  10/11/11 168 lb (76.2 kg)     Health Maintenance Due  Topic Date Due  . Hepatitis C Screening  24-Feb-1946  . TETANUS/TDAP  07/27/1965  . PNA vac Low Risk Adult (1 of 2 - PCV13) 07/28/2011    There are no preventive care reminders to display for this patient.  Lab Results  Component Value Date   TSH 0.816 08/21/2016   Lab Results  Component Value Date   WBC 7.4  03/01/2019   HGB 11.1 (L) 03/01/2019   HCT 36.4 03/01/2019   MCV 92.9 03/01/2019   PLT 253 03/01/2019   Lab Results  Component Value Date   NA 141 03/01/2019   K 4.2 03/01/2019  CO2 20 (L) 03/01/2019   GLUCOSE 148 (H) 03/01/2019   BUN 18 03/01/2019   CREATININE 1.41 (H) 03/01/2019   BILITOT 0.6 08/20/2016   ALKPHOS 31 (L) 08/20/2016   AST 35 08/20/2016   ALT 33 08/20/2016   PROT 6.0 (L) 08/20/2016   ALBUMIN 3.7 08/20/2016   CALCIUM 9.5 03/01/2019   ANIONGAP 15 03/01/2019   Lab Results  Component Value Date   CHOL 170 02/10/2010   Lab Results  Component Value Date   HDL 58 02/10/2010   Lab Results  Component Value Date   LDLCALC 72 02/10/2010   Lab Results  Component Value Date   TRIG 198 (H) 02/10/2010   Lab Results  Component Value Date   CHOLHDL 2.9 Ratio 02/10/2010   Lab Results  Component Value Date   HGBA1C 6.0 02/10/2010      Assessment & Plan:   Problem List Items Addressed This Visit    Schizophrenia ([place])   Diarrhea    Other Visit Diagnoses    Encounter to establish care    -  Primary   Dehydration, mild       Essential hypertension       Light-headed feeling        Elizabeth Schwartz was seen today for hospitalization follow-up.  Diagnoses and all orders for this visit:  Encounter to establish care/ED follow up She went to the ED for a near "syncope episode". From ED notes episode started 1 to 2 hours ago.(03/01/2019) The problem occurs rarely. The problem has been resolved. Questionable Vague response? ([place])  Dehydration, mild Eyes were sunken in, lips and tongue dry . Patients stated had diarrhea for 3-4 days. Encourage ED r/t age she refused than she must consume 64 oz of water or gateraid a day . If diarrhea has not ceased in 1-2 days GO STRAIGHT TO THE ED.  Essential hypertension Unremarkable on not on any medication at this time monitor   Schizophrenia, unspecified type ([place]) Followed outside of practice   Light-headed feeling  Unclear if Vagal response but has not had any light headedness since ED visit   Diarrhea  unknown origin not able to trace to food , or been near someone with s/s dehydrated at the time she advised OTC imodium AD  No orders of the defined types were placed in this encounter.   Follow-up: Return for re evaluation and labs.    Kerin Perna, NP

## 2019-03-20 NOTE — Progress Notes (Signed)
Patient verified DOB Patient has taken medication today. Patient has eaten today. Patient denies pain at this time. 

## 2019-03-25 ENCOUNTER — Emergency Department (HOSPITAL_COMMUNITY)
Admission: EM | Admit: 2019-03-25 | Discharge: 2019-03-25 | Disposition: A | Discharge status: Home / Self Care | Payer: Medicare Other | Attending: Emergency Medicine | Admitting: Emergency Medicine

## 2019-03-25 ENCOUNTER — Other Ambulatory Visit: Payer: Self-pay

## 2019-03-25 ENCOUNTER — Encounter (HOSPITAL_COMMUNITY): Payer: Self-pay | Admitting: Emergency Medicine

## 2019-03-25 ENCOUNTER — Emergency Department (HOSPITAL_COMMUNITY): Payer: Medicare Other

## 2019-03-25 DIAGNOSIS — Z7984 Long term (current) use of oral hypoglycemic drugs: Secondary | ICD-10-CM | POA: Diagnosis not present

## 2019-03-25 DIAGNOSIS — E119 Type 2 diabetes mellitus without complications: Secondary | ICD-10-CM | POA: Diagnosis not present

## 2019-03-25 DIAGNOSIS — F258 Other schizoaffective disorders: Secondary | ICD-10-CM | POA: Insufficient documentation

## 2019-03-25 DIAGNOSIS — Z87891 Personal history of nicotine dependence: Secondary | ICD-10-CM | POA: Insufficient documentation

## 2019-03-25 DIAGNOSIS — R0602 Shortness of breath: Secondary | ICD-10-CM | POA: Diagnosis not present

## 2019-03-25 DIAGNOSIS — E860 Dehydration: Secondary | ICD-10-CM | POA: Insufficient documentation

## 2019-03-25 DIAGNOSIS — Z79899 Other long term (current) drug therapy: Secondary | ICD-10-CM | POA: Insufficient documentation

## 2019-03-25 DIAGNOSIS — J449 Chronic obstructive pulmonary disease, unspecified: Secondary | ICD-10-CM | POA: Diagnosis not present

## 2019-03-25 DIAGNOSIS — Z20828 Contact with and (suspected) exposure to other viral communicable diseases: Secondary | ICD-10-CM | POA: Diagnosis not present

## 2019-03-25 DIAGNOSIS — I10 Essential (primary) hypertension: Secondary | ICD-10-CM | POA: Insufficient documentation

## 2019-03-25 LAB — CBG MONITORING, ED: Glucose-Capillary: 118 mg/dL — ABNORMAL HIGH (ref 70–99)

## 2019-03-25 LAB — CBC
HCT: 37.2 % (ref 36.0–46.0)
Hemoglobin: 11.6 g/dL — ABNORMAL LOW (ref 12.0–15.0)
MCH: 29.4 pg (ref 26.0–34.0)
MCHC: 31.2 g/dL (ref 30.0–36.0)
MCV: 94.2 fL (ref 80.0–100.0)
Platelets: 243 10*3/uL (ref 150–400)
RBC: 3.95 MIL/uL (ref 3.87–5.11)
RDW: 14.4 % (ref 11.5–15.5)
WBC: 6.1 10*3/uL (ref 4.0–10.5)
nRBC: 0 % (ref 0.0–0.2)

## 2019-03-25 LAB — BASIC METABOLIC PANEL
Anion gap: 15 (ref 5–15)
BUN: 22 mg/dL (ref 8–23)
CO2: 23 mmol/L (ref 22–32)
Calcium: 10.2 mg/dL (ref 8.9–10.3)
Chloride: 103 mmol/L (ref 98–111)
Creatinine, Ser: 1.18 mg/dL — ABNORMAL HIGH (ref 0.44–1.00)
GFR calc Af Amer: 53 mL/min — ABNORMAL LOW (ref >60)
GFR calc non Af Amer: 46 mL/min — ABNORMAL LOW (ref >60)
Glucose, Bld: 117 mg/dL — ABNORMAL HIGH (ref 70–99)
Potassium: 4.5 mmol/L (ref 3.5–5.1)
Sodium: 141 mmol/L (ref 135–145)

## 2019-03-25 LAB — TROPONIN I: Troponin I: <0.03 ng/mL (ref <0.03)

## 2019-03-25 LAB — D-DIMER, QUANTITATIVE: D-Dimer, Quant: 0.44 ug/mL-FEU (ref 0.00–0.50)

## 2019-03-25 LAB — BRAIN NATRIURETIC PEPTIDE: B Natriuretic Peptide: 13.3 pg/mL (ref 0.0–100.0)

## 2019-03-25 MED ORDER — SODIUM CHLORIDE 0.9 % IV BOLUS
1000.0000 mL | Freq: Once | INTRAVENOUS | Status: AC
  Administered 2019-03-25: 13:00:00 1000 mL via INTRAVENOUS

## 2019-03-25 NOTE — Discharge Instructions (Addendum)
I suggest you continue to drink at least 6 to 8 glasses of water daily to further treat your dehydration.  Try to eat a well-balanced diet.  For your shortness of breath, your chest x-ray shows no evidence of pneumonia.  I think it is reasonable to continue your Symbicort and I also recommend trying the albuterol/pro-air inhaler at home when you get short of breath, to see if this improves your symptoms.  You can use 2 puffs every 4 hours as needed.  If you notice this shortness of breath is worse when outside, bring the inhaler with you.  Also recommend calling your new primary doctor to establish further work-up for this.  Return to the ER if you have any chest pain or shortness of breath at rest.

## 2019-03-25 NOTE — ED Notes (Signed)
Lab to add on trop and BNP

## 2019-03-25 NOTE — ED Notes (Signed)
Patient verbalizes understanding of discharge instructions. Opportunity for questioning and answers were provided. Armband removed by staff, pt discharged from ED.  

## 2019-03-25 NOTE — ED Notes (Signed)
Pt states she has had sore throat for a few days and wants to be tested for COVID 19

## 2019-03-25 NOTE — ED Provider Notes (Signed)
[facility] EMERGENCY DEPARTMENT Provider Note   CSN: [id number] Arrival date & time: 03/25/19  1033    History   Chief Complaint Chief Complaint  Patient presents with  . Near Syncope  . Shortness of Breath    HPI Elizabeth Schwartz is a 73 y.o. female.     HPI   73 year old female with past medical history as below here with desire for checkup.  The patient was recently seen in the ED on 4/17 for possible syncopal episode and mild dehydration.  She states she had been overall improving.  Her diarrhea has improved.  She states that she went to the office today to check up and complained of some mild shortness of breath so was sent here.  She states her shortness of breath seems to be fairly chronic, she does have a history of COPD.  He does improve with breathing treatments.  Denies any fever, cough, or recurrent syncopal episodes.  No lightheadedness.  No dizziness.  No chest or abdominal pain.  Her appetite has improved and she is been trying to drink enough water.  She states she is here just because she wanted a checkup, and states that otherwise, she has been feeling significantly better and was not planning to come to the ER until she was told to do so at the urgent care.  Of note, she was not evaluated a provider at that time.  No sputum production.  No other complaints.  Past Medical History:  Diagnosis Date  . Anemia   . COPD (chronic obstructive pulmonary disease) ([place])   . Depression   . Diabetes mellitus   . GERD (gastroesophageal reflux disease)   . Hyperlipidemia   . Hypertension   . Osteoporosis   . Schizophrenia, schizo-affective ([place])   . SOB (shortness of breath) on exertion     Patient Active Problem List   Diagnosis Date Noted  . Schizophrenia ([place]) 03/20/2019  . Diarrhea 03/20/2019  . Asthma 09/08/2011  . Diverticulosis 09/08/2011  . Smoker 09/08/2011  . GERD 06/04/2007    Past Surgical History:  Procedure Laterality Date  . BREAST  BIOPSY Right 2017   benign  . LAPAROSCOPY  1975   ABD pain  . VAGINAL HYSTERECTOMY  1975     OB History   No obstetric history on file.      Home Medications    Prior to Admission medications   Medication Sig Start Date End Date Taking? Authorizing Provider  alendronate (FOSAMAX) 70 MG tablet Take 1 tablet by mouth once a week. 02/19/19   [provider]  calcium carbonate (OS-CAL) 600 MG TABS Take 600 mg by mouth 2 (two) times daily with a meal.      [provider]  clozapine (CLOZARIL) 200 MG tablet Take 1 tablet by mouth at bedtime. 02/06/19   [provider]  donepezil (ARICEPT) 10 MG tablet Take 10 mg by mouth at bedtime.      [provider]  famotidine (PEPCID) 20 MG tablet Take 1 tablet by mouth 2 (two) times daily. 02/18/19   [provider]  fenofibrate 160 MG tablet Take 160 mg by mouth daily.      [provider]  fish oil-omega-3 fatty acids 1000 MG capsule Take 1 g by mouth every morning.    [provider]  metFORMIN (GLUCOPHAGE) 500 MG tablet Take 500 mg by mouth 2 (two) times daily with a meal.     [provider]  simvastatin (  ZOCOR) 40 MG tablet Take 40 mg by mouth daily.     [provider]  SYMBICORT 160-4.5 MCG/ACT inhaler Inhale 2 puffs into the lungs 2 (two) times daily. 02/06/19   [provider]  vitamin C (ASCORBIC ACID) 500 MG tablet Take 500 mg by mouth every morning.    [provider]  VITAMIN D, CHOLECALCIFEROL, PO Take 600 Units by mouth daily.      [provider]    Family History No family history on file.  Social History Social History   Tobacco Use  . Smoking status: Former Smoker    Years: 15.00    Types: Cigarettes    Last attempt to quit: 02/28/2012    Years since quitting: 7.0  . Smokeless tobacco: Former Systems developer    Types: Chew  Substance Use Topics  . Alcohol use: Yes    Comment: "socially"  . Drug use: No     Allergies    Patient has no known allergies.   Review of Systems Review of Systems  Constitutional: Negative for chills, fatigue and fever.  HENT: Negative for congestion and rhinorrhea.   Eyes: Negative for visual disturbance.  Respiratory: Positive for shortness of breath (Occasional, with exertion only). Negative for cough and wheezing.   Cardiovascular: Negative for chest pain and leg swelling.  Gastrointestinal: Negative for abdominal pain, diarrhea, nausea and vomiting.  Genitourinary: Negative for dysuria and flank pain.  Musculoskeletal: Negative for neck pain and neck stiffness.  Skin: Negative for rash and wound.  Allergic/Immunologic: Negative for immunocompromised state.  Neurological: Negative for syncope, weakness and headaches.  All other systems reviewed and are negative.    Physical Exam Updated Vital Signs BP (!) 128/55 (BP Location: Right Arm)   Pulse (!) 104   Temp 97.6 F (36.4 C) (Oral)   Resp 17   Wt 74.1 kg   SpO2 97%   BMI 29.88 kg/m   Physical Exam Vitals signs and nursing note reviewed.  Constitutional:      General: She is not in acute distress.    Appearance: She is well-developed.  HENT:     Head: Normocephalic and atraumatic.  Eyes:     Conjunctiva/sclera: Conjunctivae normal.  Neck:     Musculoskeletal: Neck supple.  Cardiovascular:     Rate and Rhythm: Normal rate and regular rhythm.     Heart sounds: Normal heart sounds. No murmur. No friction rub.  Pulmonary:     Effort: Pulmonary effort is normal. No respiratory distress.     Breath sounds: Normal breath sounds. No wheezing or rales.  Abdominal:     General: There is no distension.     Palpations: Abdomen is soft.     Tenderness: There is no abdominal tenderness.  Skin:    General: Skin is warm.     Capillary Refill: Capillary refill takes less than 2 seconds.  Neurological:     Mental Status: She is alert and oriented to person, place, and time.     Motor: No abnormal muscle tone.       ED Treatments / Results  Labs (all labs ordered are listed, but only abnormal results are displayed) Labs Reviewed  BASIC METABOLIC PANEL - Abnormal; Notable for the following components:      Result Value   Glucose, Bld 117 (*)    Creatinine, Ser 1.18 (*)    GFR calc non Af Amer 46 (*)    GFR calc Af Amer 53 (*)    All  other components within normal limits  CBC - Abnormal; Notable for the following components:   Hemoglobin 11.6 (*)    All other components within normal limits  CBG MONITORING, ED - Abnormal; Notable for the following components:   Glucose-Capillary 118 (*)    All other components within normal limits  NOVEL CORONAVIRUS, NAA (HOSPITAL ORDER, SEND-OUT TO REF LAB)  TROPONIN I  BRAIN NATRIURETIC PEPTIDE  D-DIMER, QUANTITATIVE (NOT AT [facility])  URINALYSIS, ROUTINE W REFLEX MICROSCOPIC    EKG EKG Interpretation  Date/Time:  Monday Mar 25 2019 10:59:13 EDT Ventricular Rate:  102 PR Interval:  160 QRS Duration: 90 QT Interval:  366 QTC Calculation: 477 R Axis:   -90 Text Interpretation:  Sinus tachycardia Left axis deviation Possible Inferior infarct , age undetermined Possible Anterior infarct , age undetermined Abnormal ECG similar to prior 4/20 Confirmed by Aletta Edouard [phone]) on 03/25/2019 11:14:43 AM Also confirmed by Aletta Edouard [phone]), editor Philomena Doheny [phone])  on 03/25/2019 12:50:04 PM   Radiology Dg Chest 2 View  Result Date: 03/25/2019 CLINICAL DATA:  SOB with confusion. EXAM: CHEST - 2 VIEW COMPARISON:  08/21/2016. FINDINGS: Normal heart size. No consolidation or edema. No effusion or pneumothorax. Bones unremarkable. IMPRESSION: No active cardiopulmonary disease. Improved aeration from priors. Electronically Signed   By: Staci Righter M.D.   On: 03/25/2019 12:53    Procedures Procedures (including critical care time)  Medications Ordered in ED Medications  sodium chloride 0.9 % bolus 1,000 mL (1,000 mLs Intravenous New Bag/Given  03/25/19 1328)     Initial Impression / Assessment and Plan / ED Course  I have reviewed the triage vital signs and the nursing notes.  Pertinent labs & imaging results that were available during my care of the patient were reviewed by me and considered in my medical decision making (see chart for details).  Clinical Course as of Mar 24 1416  Mon Mar 25, 2019  1300 73 yo F here with desire for check-up 2/2 recent generalized weakness, now transient SOB. No chest pain. I suspect this is mild underlying COPD given known issues with this and resolution with breathing tx. Her labs today are very reassuring. CBC normal w/ normal WBC. Hgb @ baseline. BMP unremarkable. No abd pain or TTP. Trop neg, EKG non-ischemic, no CP - doubt ACS.   [CI]  1330 CXR reviewed and is negative. Labs with mild persistent dehydration (Cr slightly above baseline) but is o/w reassuring.    [CI]  1416 Labs reviewed.  Chest x-ray clear.  She has no wheezing on repeat exam.  Heart rate is in the 70s and she has been given gentle fluids.  Suspect that her shortness of breath is likely related to her mild COPD for which I recommend that she uses her inhaler as needed when she is outside.  This is an ongoing issue for her.  She has no evidence to suggest DVT or PE.  D-dimer is negative.  Lab work is otherwise reassuring.  She has no chest pain and negative tropes by constant symptoms, making ACS unlikely.  She will call her new PCP.  We have also sent a send out test given that she lives in an independent living with subjective shortness of breath and meets criteria for testing.  She will follow-up as an outpatient.   [CI]    Clinical Course User Index [CI] Duffy Bruce, MD       Vesper Trant was evaluated in Emergency Department on 03/25/2019 for the symptoms  described in the history of present illness. She was evaluated in the context of the global COVID-19 pandemic, which necessitated consideration that the patient might be  at risk for infection with the SARS-CoV-2 virus that causes COVID-19. Institutional protocols and algorithms that pertain to the evaluation of patients at risk for COVID-19 are in a state of rapid change based on information released by regulatory bodies including the CDC and federal and state organizations. These policies and algorithms were followed during the patient's care in the ED.   Final Clinical Impressions(s) / ED Diagnoses   Final diagnoses:  SOB (shortness of breath)  Mild dehydration    ED Discharge Orders    None       Duffy Bruce, MD 03/25/19 1417

## 2019-03-25 NOTE — ED Triage Notes (Signed)
Pt in with c/o sob x few days, also feels generally weak and like she will pass out. Asking to be tested for Covid, denies any cough, cp or fever.

## 2019-03-26 LAB — NOVEL CORONAVIRUS, NAA (HOSP ORDER, SEND-OUT TO REF LAB; TAT 18-24 HRS): SARS-CoV-2, NAA: NOT DETECTED

## 2019-04-02 NOTE — Progress Notes (Signed)
Subjective:    Patient ID: Elizabeth Schwartz, female    DOB: 06-14-1946, 73 y.o.   MRN: [id number]  This is a 73 year old female history of asthma and COPD former smoker.  Patient is referred for further evaluation of bleeding after being in the emergency room a week ago.  She is on the Symbicort inhaler 2 inhalations twice daily and albuterol as needed.  The patient had previously been seen by way of a telephone visit on 6 May with 1 of our other providers.  At that point she was having difficulty with loose stools and associated diarrhea and was concerned about the possibility of COVID.  At that point was recommended she go to the emergency room but she refused to do that.  She did ultimately go to the emergency room on 11 May but at that point was more for shortness of breath.  All lab tests were unremarkable and she was not tested for COVID at that time.  She was discharged home.  Since that time her breathing has been stable.  It comes and goes with the shortness of breath.  She is on the Symbicort on a daily basis.  She was diagnosed with asthma and COPD overlap syndrome in 2013 by another pulmonary physician.  Pulmonary functions done at that time showed no significant obstruction and DLCO was preserved.  The patient's not on oxygen at this time.  The patient has quit smoking  See below shortness of breath assessment   Shortness of Breath  This is a chronic problem. The current episode started more than 1 year ago. The problem occurs every several days (exertional only   ). The problem has been waxing and waning. Associated symptoms include orthopnea and a sore throat. Pertinent negatives include no chest pain, claudication, ear pain, fever, headaches, hemoptysis, leg pain, leg swelling, PND, sputum production or wheezing. The symptoms are aggravated by any activity and exercise. Associated symptoms comments: Dry cough, no wheeze. Risk factors include smoking. She has tried beta agonist inhalers and  steroid inhalers for the symptoms. The treatment provided significant relief. Her past medical history is significant for asthma, COPD and pneumonia. There is no history of allergies, CAD, DVT, a heart failure or PE.   Past Medical History:  Diagnosis Date  . Anemia   . COPD (chronic obstructive pulmonary disease) ([place])   . Depression   . Diabetes mellitus   . GERD (gastroesophageal reflux disease)   . Hyperlipidemia   . Hypertension   . Osteoporosis   . Schizophrenia, schizo-affective ([place])   . SOB (shortness of breath) on exertion      History reviewed. No pertinent family history.   Social History   Socioeconomic History  . Marital status: Single    Spouse name: Not on file  . Number of children: Not on file  . Years of education: Not on file  . Highest education level: Not on file  Occupational History  . Not on file  Social Needs  . Financial resource strain: Not on file  . Food insecurity:    Worry: Not on file    Inability: Not on file  . Transportation needs:    Medical: Not on file    Non-medical: Not on file  Tobacco Use  . Smoking status: Former Smoker    Years: 15.00    Types: Cigarettes    Last attempt to quit: 02/28/2012    Years since quitting: 7.0  . Smokeless tobacco: Former Systems developer  Types: Chew  Substance and Sexual Activity  . Alcohol use: Yes    Comment: "socially"  . Drug use: No  . Sexual activity: Not Currently  Lifestyle  . Physical activity:    Days per week: Not on file    Minutes per session: Not on file  . Stress: Not on file  Relationships  . Social connections:    Talks on phone: Not on file    Gets together: Not on file    Attends religious service: Not on file    Active member of club or organization: Not on file    Attends meetings of clubs or organizations: Not on file    Relationship status: Not on file  . Intimate partner violence:    Fear of current or ex partner: Not on file    Emotionally abused: Not on file     Physically abused: Not on file    Forced sexual activity: Not on file  Other Topics Concern  . Not on file  Social History Narrative  . Not on file     No Known Allergies   Outpatient Medications Prior to Visit  Medication Sig Dispense Refill  . alendronate (FOSAMAX) 70 MG tablet Take 1 tablet by mouth once a week.    . calcium carbonate (OS-CAL) 600 MG TABS Take 600 mg by mouth 2 (two) times daily with a meal.      . clozapine (CLOZARIL) 200 MG tablet Take 1 tablet by mouth at bedtime.    . donepezil (ARICEPT) 10 MG tablet Take 10 mg by mouth at bedtime.      . famotidine (PEPCID) 20 MG tablet Take 1 tablet by mouth 2 (two) times daily.    . fenofibrate 160 MG tablet Take 160 mg by mouth daily.      . fish oil-omega-3 fatty acids 1000 MG capsule Take 1 g by mouth every morning.    . metFORMIN (GLUCOPHAGE) 500 MG tablet Take 500 mg by mouth 2 (two) times daily with a meal.     . ONETOUCH VERIO test strip     . ramipril (ALTACE) 2.5 MG capsule     . simvastatin (ZOCOR) 40 MG tablet Take 40 mg by mouth daily.     . vitamin C (ASCORBIC ACID) 500 MG tablet Take 500 mg by mouth every morning.    Marland Kitchen VITAMIN D, CHOLECALCIFEROL, PO Take 600 Units by mouth daily.      Marland Kitchen glucose blood test strip OneTouch Verio test strips    . SYMBICORT 160-4.5 MCG/ACT inhaler Inhale 2 puffs into the lungs 2 (two) times daily.     No facility-administered medications prior to visit.       Review of Systems  Constitutional: Negative for fever.  HENT: Positive for sore throat. Negative for ear pain.   Respiratory: Positive for shortness of breath. Negative for hemoptysis, sputum production and wheezing.   Cardiovascular: Positive for orthopnea. Negative for chest pain, claudication, leg swelling and PND.  Neurological: Negative for headaches.       Objective:   Physical Exam Vitals:   04/03/19 0906  BP: 119/75  Pulse: (!) 103  Temp: (!) 97.2 F (36.2 C)  TempSrc: Oral  SpO2: 98%  Weight: 157 lb  12.8 oz (71.6 kg)    Gen: Pleasant, well-nourished, in no distress,  normal affect  ENT: No lesions,  mouth clear,  oropharynx clear, no postnasal drip  Neck: No JVD, no TMG, no carotid bruits  Lungs: No use  of accessory muscles, no dullness to percussion, distant breath sounds without wheezes  Cardiovascular: RRR, heart sounds normal, no murmur or gallops, no peripheral edema  Abdomen: soft and NT, no HSM,  BS normal  Musculoskeletal: No deformities, no cyanosis or clubbing  Neuro: alert, non focal  Skin: Warm, no lesions or rashes  Hemoglobin A1c 5.8 CBG 150  - PFTs 10/11/2011 FEV1  1.76 (86%) ratio 77 and no better with B2,  DLC0 66 corrects 111   5/11 CXR: IMPRESSION: No active cardiopulmonary disease.  Improved aeration from priors.     Assessment & Plan:  I personally reviewed all images and lab data in the [facility] system as well as any outside material available during this office visit and agree with the  radiology impressions.   COPD (chronic obstructive pulmonary disease) (H[facility]) There is no clear-cut evidence of COPD in this patient she likely has asthma alone and she did quit smoking her breathing has improved since that time  Note the patient is on an ACE inhibitor however the patient is not having cough related symptoms with the ACE inhibitor  I will resolve COPD off the  patient's problem list  Asthma, moderate persistent Moderate persistent asthma uncomplicated overlapping with COPD note pulmonary function studies showed a very mild reduction and the FEV1 to FVC ratio and did not change with bronchodilators  Ambulating around the office the patient did not desaturate an overnight sleep oximetry in October 2013 was normal  Plan will be to continue Symbicort at 2 inhalations twice daily and as needed albuterol    Former smoker The patient is an ex-smoker and I congratulated her on this accomplishment   Bluma was seen today for hospitalization  follow-up.  Diagnoses and all orders for this visit:  Moderate persistent asthma without complication  Type 2 diabetes mellitus without complication, without long-term current use of insulin (HCC) -     POCT glucose (manual entry) -     POCT glycosylated hemoglobin (Hb A1C) -     Microalbumin / creatinine urine ratio  Chronic obstructive pulmonary disease, unspecified COPD type ([place])  Former smoker  Other orders -     SYMBICORT 160-4.5 MCG/ACT inhaler; Inhale 2 puffs into the lungs 2 (two) times daily. -     albuterol (VENTOLIN HFA) 108 (90 Base) MCG/ACT inhaler; Inhale 2 puffs into the lungs every 4 (four) hours as needed for wheezing or shortness of breath.

## 2019-04-03 ENCOUNTER — Other Ambulatory Visit: Payer: Self-pay | Admitting: Emergency Medicine

## 2019-04-03 ENCOUNTER — Encounter: Payer: Self-pay | Admitting: Critical Care Medicine

## 2019-04-03 ENCOUNTER — Ambulatory Visit: Payer: Medicare Other | Attending: Critical Care Medicine | Admitting: Critical Care Medicine

## 2019-04-03 ENCOUNTER — Other Ambulatory Visit: Payer: Self-pay

## 2019-04-03 VITALS — BP 119/75 | HR 103 | Temp 97.2°F | Wt 157.8 lb

## 2019-04-03 DIAGNOSIS — J4540 Moderate persistent asthma, uncomplicated: Secondary | ICD-10-CM | POA: Diagnosis not present

## 2019-04-03 DIAGNOSIS — E119 Type 2 diabetes mellitus without complications: Secondary | ICD-10-CM

## 2019-04-03 DIAGNOSIS — Z87891 Personal history of nicotine dependence: Secondary | ICD-10-CM

## 2019-04-03 LAB — POCT GLYCOSYLATED HEMOGLOBIN (HGB A1C): HbA1c, POC (prediabetic range): 5.8 % (ref 5.7–6.4)

## 2019-04-03 LAB — GLUCOSE, POCT (MANUAL RESULT ENTRY): POC Glucose: 150 mg/dl — AB (ref 70–99)

## 2019-04-03 MED ORDER — ALBUTEROL SULFATE HFA 108 (90 BASE) MCG/ACT IN AERS: 2.0000 | INHALATION_SPRAY | RESPIRATORY_TRACT | 2 refills | Status: DC | PRN

## 2019-04-03 MED ORDER — SYMBICORT 160-4.5 MCG/ACT IN AERO: 2.0000 | INHALATION_SPRAY | Freq: Two times a day (BID) | RESPIRATORY_TRACT | 6 refills | Status: DC

## 2019-04-03 NOTE — Assessment & Plan Note (Signed)
Moderate persistent asthma uncomplicated overlapping with COPD note pulmonary function studies showed a very mild reduction and the FEV1 to FVC ratio and did not change with bronchodilators  Ambulating around the office the patient did not desaturate an overnight sleep oximetry in October 2013 was normal  Plan will be to continue Symbicort at 2 inhalations twice daily and as needed albuterol

## 2019-04-03 NOTE — Assessment & Plan Note (Signed)
The patient is an ex-smoker and I congratulated her on this accomplishment

## 2019-04-03 NOTE — Patient Instructions (Addendum)
Please establish care in July with a provider at [facility]  Refill on Symbicort and albuterol sent to your local pharmacy  Return to see Dr. Joya Gaskins in 3 months for lung follow-up

## 2019-04-03 NOTE — Assessment & Plan Note (Addendum)
There is no clear-cut evidence of COPD in this patient she likely has asthma alone and she did quit smoking her breathing has improved since that time  Note the patient is on an ACE inhibitor however the patient is not having cough related symptoms with the ACE inhibitor  I will resolve COPD office patient's problem list

## 2019-04-03 NOTE — Progress Notes (Signed)
cbg-150 a1c-5.8

## 2019-04-04 ENCOUNTER — Other Ambulatory Visit: Payer: Self-pay | Admitting: Physician Assistant

## 2019-04-04 LAB — MICROALBUMIN / CREATININE URINE RATIO
Creatinine, Urine: 170.1 mg/dL
Microalb/Creat Ratio: 14 mg/g creat (ref 0–29)
Microalbumin, Urine: 23 ug/mL

## 2019-04-05 ENCOUNTER — Telehealth: Payer: Self-pay | Admitting: *Deleted

## 2019-04-05 NOTE — Telephone Encounter (Signed)
-----   Message from [name], MD sent at 04/05/2019  9:04 AM EDT ----- pls let ms Ishmael Holter know her microalbumin test NORMAL.  Kidney function normal, no evidence for kidney damage from her diabetes

## 2019-04-05 NOTE — Telephone Encounter (Signed)
Medical Assistant left message on patient's home and cell voicemail. Voicemail states to give a call back to  with [facility] at [phone].  

## 2019-05-20 ENCOUNTER — Other Ambulatory Visit: Payer: Self-pay | Admitting: Critical Care Medicine

## 2019-05-27 ENCOUNTER — Ambulatory Visit: Payer: Medicare Other | Admitting: Internal Medicine

## 2019-05-30 ENCOUNTER — Ambulatory Visit: Payer: Medicare Other | Admitting: Internal Medicine

## 2019-05-30 ENCOUNTER — Other Ambulatory Visit: Payer: Self-pay | Admitting: Internal Medicine

## 2019-05-30 DIAGNOSIS — Z1231 Encounter for screening mammogram for malignant neoplasm of breast: Secondary | ICD-10-CM

## 2019-06-10 NOTE — Progress Notes (Signed)
Subjective:    Patient ID: Elizabeth Schwartz, female    DOB: 06-Jun-1946, 73 y.o.   MRN: [id number]  This is a 73 year old female history of asthma and poss COPD and former smoker.    I last saw this patient in May 2020 and began the patient on the Symbicort 160 at 2 inhalations twice daily.  Note that pulmonary function studies have shown mild reduction in FEV1 and FVC ratio without change in bronchodilators.  Also the patient has not had desaturation with exertion.  The patient also has as needed albuterol.  The patient is an ex-smoker.  Patient did have issues with syncope and presyncope over the last several months.  Particularly in the high heat the patient had spells of dizziness and lightheadedness.  EMS was called at 1 point EKG was unremarkable.  The patient denies any chest pain at this time or cough or mucus production.  She does tell me now that she does have a primary care provider who follows her regularly and also she goes to [facility] for her mental health care  She was diagnosed with asthma and COPD overlap syndrome in 2013 by another pulmonary physician.  Pulmonary functions done at that time showed no significant obstruction and DLCO was preserved.  The patient's not on oxygen at this time.  Note the patient does monitor her blood sugars and they have been in the 80-1 10 range  Shortness of Breath This is a chronic problem. The current episode started more than 1 year ago. The problem occurs every several days (exertional only   ). The problem has been rapidly improving. Associated symptoms include syncope. Pertinent negatives include no abdominal pain, chest pain, claudication, coryza, ear pain, fever, headaches, hemoptysis, leg pain, leg swelling, neck pain, orthopnea, PND, rash, rhinorrhea, sore throat, sputum production, swollen glands, vomiting or wheezing. The symptoms are aggravated by any activity and exercise. Associated symptoms comments: Dry cough, no wheeze. Risk factors  include smoking. She has tried beta agonist inhalers and steroid inhalers for the symptoms. The treatment provided significant relief. Her past medical history is significant for asthma, COPD and pneumonia. There is no history of allergies, CAD, DVT, a heart failure or PE.   Past Medical History:  Diagnosis Date  . Anemia   . COPD (chronic obstructive pulmonary disease) ([place])   . Depression   . Diabetes mellitus   . GERD (gastroesophageal reflux disease)   . Hyperlipidemia   . Hypertension   . Osteoporosis   . Schizophrenia, schizo-affective ([place])   . SOB (shortness of breath) on exertion      History reviewed. No pertinent family history.   Social History   Socioeconomic History  . Marital status: Single    Spouse name: Not on file  . Number of children: Not on file  . Years of education: Not on file  . Highest education level: Not on file  Occupational History  . Not on file  Social Needs  . Financial resource strain: Not on file  . Food insecurity    Worry: Not on file    Inability: Not on file  . Transportation needs    Medical: Not on file    Non-medical: Not on file  Tobacco Use  . Smoking status: Former Smoker    Years: 15.00    Types: Cigarettes    Quit date: 02/28/2012    Years since quitting: 7.2  . Smokeless tobacco: Former Systems developer    Types: Chew  Substance and Sexual Activity  .  Alcohol use: Yes    Comment: "socially"  . Drug use: No  . Sexual activity: Not Currently  Lifestyle  . Physical activity    Days per week: Not on file    Minutes per session: Not on file  . Stress: Not on file  Relationships  . Social Herbalist on phone: Not on file    Gets together: Not on file    Attends religious service: Not on file    Active member of club or organization: Not on file    Attends meetings of clubs or organizations: Not on file    Relationship status: Not on file  . Intimate partner violence    Fear of current or ex partner: Not on file     Emotionally abused: Not on file    Physically abused: Not on file    Forced sexual activity: Not on file  Other Topics Concern  . Not on file  Social History Narrative  . Not on file     No Known Allergies   Outpatient Medications Prior to Visit  Medication Sig Dispense Refill  . albuterol (VENTOLIN HFA) 108 (90 Base) MCG/ACT inhaler INHALE TWO PUFFS BY MOUTH INTO THE LUNGS EVERY 4 HOURS AS NEEDED FOR WHEEZING OR SHORTNESS OF BREATH 18 g 2  . alendronate (FOSAMAX) 70 MG tablet Take 1 tablet by mouth once a week.    . calcium carbonate (OS-CAL) 600 MG TABS Take 600 mg by mouth 2 (two) times daily with a meal.      . clozapine (CLOZARIL) 200 MG tablet Take 1 tablet by mouth at bedtime.    . donepezil (ARICEPT) 10 MG tablet Take 10 mg by mouth at bedtime.      . famotidine (PEPCID) 20 MG tablet Take 1 tablet by mouth 2 (two) times daily.    . fish oil-omega-3 fatty acids 1000 MG capsule Take 1 g by mouth every morning.    . metFORMIN (GLUCOPHAGE) 500 MG tablet Take 500 mg by mouth 2 (two) times daily with a meal.     . ONETOUCH VERIO test strip     . ramipril (ALTACE) 2.5 MG capsule     . SYMBICORT 160-4.5 MCG/ACT inhaler INHALE TWO PUFFS BY MOUTH INTO THE LUNGS TWICE A DAY 1 Inhaler 2  . vitamin C (ASCORBIC ACID) 500 MG tablet Take 500 mg by mouth every morning.    Marland Kitchen VITAMIN D, CHOLECALCIFEROL, PO Take 600 Units by mouth daily.      Marland Kitchen omeprazole (PRILOSEC) 20 MG capsule     . OneTouch Delica Lancets 39Q MISC     . permethrin (ELIMITE) 5 % cream APPLY TO SCALP AND WASH OFF AFTER 12 HOURS. MAY REPEAT IN 7 DAYS AS NEEDED    . fenofibrate 160 MG tablet Take 160 mg by mouth daily.      . simvastatin (ZOCOR) 40 MG tablet Take 40 mg by mouth daily.      No facility-administered medications prior to visit.       Review of Systems  Constitutional: Negative for fever.  HENT: Negative for ear pain, rhinorrhea and sore throat.   Respiratory: Positive for shortness of breath. Negative for  hemoptysis, sputum production and wheezing.   Cardiovascular: Positive for syncope. Negative for chest pain, orthopnea, claudication, leg swelling and PND.  Gastrointestinal: Negative for abdominal pain and vomiting.  Musculoskeletal: Negative for neck pain.  Skin: Negative for rash.  Neurological: Negative for headaches.  Objective:   Physical Exam Vitals:   06/11/19 1040  BP: 110/70  Pulse: 98  Temp: 98.2 F (36.8 C)  TempSrc: Oral  SpO2: 97%  Weight: 157 lb (71.2 kg)  Height: 5\' 2"  (1.575 m)    Gen: Pleasant, well-nourished, in no distress,  normal affect  ENT: No lesions,  mouth clear,  oropharynx clear, no postnasal drip  Neck: No JVD, no TMG, no carotid bruits  Lungs: No use of accessory muscles, no dullness to percussion, improved breath sounds without wheezes cardiovascular: RRR, heart sounds normal, no murmur or gallops, no peripheral edema  Abdomen: soft and NT, no HSM,  BS normal  Musculoskeletal: No deformities, no cyanosis or clubbing  Neuro: alert, non focal  Skin: Warm, no lesions or rashes  EKG is obtained at this visit and showed normal sinus rhythm and mild voltage changes compatible with left ventricular hypertrophy  Prev Hemoglobin A1c 5.8   - PFTs 10/11/2011 FEV1  1.76 (86%) ratio 77 and no better with B2,  DLC0 66 corrects 111   5/11 CXR: IMPRESSION: No active cardiopulmonary disease.  Improved aeration from priors.     Assessment & Plan:  I personally reviewed all images and lab data in the [facility] system as well as any outside material available during this office visit and agree with the  radiology impressions.   Asthma, moderate persistent Moderate persistent asthma without evidence of clear-cut COPD  We will maintain Symbicort 2 inhalations twice daily   Syncope History of syncope with normal blood pressures and no evidence of primary cardiac abnormalities  We will continue to monitor this patient for now   Elizabeth Schwartz was  seen today for copd.  Diagnoses and all orders for this visit:  Moderate persistent asthma without complication  Syncope, unspecified syncope type

## 2019-06-11 ENCOUNTER — Ambulatory Visit: Payer: Medicare Other | Attending: Critical Care Medicine | Admitting: Critical Care Medicine

## 2019-06-11 ENCOUNTER — Encounter: Payer: Self-pay | Admitting: Critical Care Medicine

## 2019-06-11 ENCOUNTER — Other Ambulatory Visit: Payer: Self-pay

## 2019-06-11 VITALS — BP 110/70 | HR 98 | Temp 98.2°F | Ht 62.0 in | Wt 157.0 lb

## 2019-06-11 DIAGNOSIS — I10 Essential (primary) hypertension: Secondary | ICD-10-CM | POA: Insufficient documentation

## 2019-06-11 DIAGNOSIS — J449 Chronic obstructive pulmonary disease, unspecified: Secondary | ICD-10-CM | POA: Insufficient documentation

## 2019-06-11 DIAGNOSIS — Z79899 Other long term (current) drug therapy: Secondary | ICD-10-CM | POA: Diagnosis not present

## 2019-06-11 DIAGNOSIS — Z87891 Personal history of nicotine dependence: Secondary | ICD-10-CM | POA: Diagnosis not present

## 2019-06-11 DIAGNOSIS — K219 Gastro-esophageal reflux disease without esophagitis: Secondary | ICD-10-CM | POA: Diagnosis not present

## 2019-06-11 DIAGNOSIS — E785 Hyperlipidemia, unspecified: Secondary | ICD-10-CM | POA: Insufficient documentation

## 2019-06-11 DIAGNOSIS — J4540 Moderate persistent asthma, uncomplicated: Secondary | ICD-10-CM | POA: Diagnosis not present

## 2019-06-11 DIAGNOSIS — R55 Syncope and collapse: Secondary | ICD-10-CM | POA: Diagnosis not present

## 2019-06-11 DIAGNOSIS — F209 Schizophrenia, unspecified: Secondary | ICD-10-CM | POA: Diagnosis not present

## 2019-06-11 DIAGNOSIS — Z7951 Long term (current) use of inhaled steroids: Secondary | ICD-10-CM | POA: Diagnosis not present

## 2019-06-11 DIAGNOSIS — Z7984 Long term (current) use of oral hypoglycemic drugs: Secondary | ICD-10-CM | POA: Diagnosis not present

## 2019-06-11 DIAGNOSIS — E119 Type 2 diabetes mellitus without complications: Secondary | ICD-10-CM | POA: Diagnosis not present

## 2019-06-11 HISTORY — DX: Syncope and collapse: R55

## 2019-06-11 NOTE — Assessment & Plan Note (Signed)
Moderate persistent asthma without evidence of clear-cut COPD  We will maintain Symbicort 2 inhalations twice daily

## 2019-06-11 NOTE — Patient Instructions (Signed)
Your EKG was normal  No change in your Symbicort continue use of inhalations twice a day work on your inhalation technique  No other changes in your medications  Return to see Dr. Joya Gaskins again in 4 months

## 2019-06-11 NOTE — Assessment & Plan Note (Signed)
History of syncope with normal blood pressures and no evidence of primary cardiac abnormalities  We will continue to monitor this patient for now

## 2019-06-28 ENCOUNTER — Ambulatory Visit: Payer: Medicare Other | Admitting: Internal Medicine

## 2019-07-03 ENCOUNTER — Ambulatory Visit: Payer: Medicare Other | Admitting: Critical Care Medicine

## 2019-07-12 ENCOUNTER — Ambulatory Visit: Payer: Medicare Other | Admitting: Internal Medicine

## 2019-07-16 ENCOUNTER — Other Ambulatory Visit: Payer: Self-pay

## 2019-07-16 ENCOUNTER — Ambulatory Visit
Admission: RE | Admit: 2019-07-16 | Discharge: 2019-07-16 | Disposition: A | Discharge status: Home / Self Care | Payer: Medicare Other | Source: Ambulatory Visit | Attending: Internal Medicine | Admitting: Internal Medicine

## 2019-07-16 DIAGNOSIS — Z1231 Encounter for screening mammogram for malignant neoplasm of breast: Secondary | ICD-10-CM

## 2019-10-13 NOTE — Progress Notes (Signed)
Subjective:    Patient ID: Elizabeth Schwartz, female    DOB: 09/25/1946, 73 y.o.   MRN: [id number]  This is a 73 year old female history of asthma and poss COPD and former smoker.    I last saw this patient in May 2020 and began the patient on the Symbicort 160 at 2 inhalations twice daily.  Note that pulmonary function studies have shown mild reduction in FEV1 and FVC ratio without change in bronchodilators.  Also the patient has not had desaturation with exertion.  The patient also has as needed albuterol.  The patient is an ex-smoker.  Patient did have issues with syncope and presyncope over the last several months.  Particularly in the high heat the patient had spells of dizziness and lightheadedness.  EMS was called at 1 point EKG was unremarkable.  The patient denies any chest pain at this time or cough or mucus production.  She does tell me now that she does have a primary care provider who follows her regularly and also she goes to [facility] for her mental health care  She was diagnosed with asthma and COPD overlap syndrome in 2013 by another pulmonary physician.  Pulmonary functions done at that time showed no significant obstruction and DLCO was preserved.  The patient's not on oxygen at this time.  Note the patient does monitor her blood sugars and they have been in the 80-1 10 range  11/30: Soft follow-up visit for moderate persistent asthma and the patient is doing well in this regard.  She is maintaining Symbicort inhalations twice daily 2 puffs each She has had no recurrent exacerbations of her asthma and she has had no further syncopal episodes  She denies chest pain cough wheezing or other respiratory complaints at this time  Note her hemoglobin A1c today was 5.9   Shortness of Breath This is a chronic problem. The current episode started more than 1 year ago. The problem occurs every several days (exertional only   ). The problem has been rapidly improving. Pertinent negatives  include no abdominal pain, chest pain, claudication, coryza, ear pain, fever, headaches, hemoptysis, leg pain, leg swelling, neck pain, orthopnea, PND, rash, rhinorrhea, sore throat, sputum production, swollen glands, syncope, vomiting or wheezing. The symptoms are aggravated by any activity and exercise. Associated symptoms comments: Dry cough, no wheeze. Risk factors include smoking. She has tried beta agonist inhalers and steroid inhalers for the symptoms. The treatment provided significant relief. Her past medical history is significant for asthma, COPD and pneumonia. There is no history of allergies, CAD, DVT, a heart failure or PE.   Past Medical History:  Diagnosis Date  . Anemia   . COPD (chronic obstructive pulmonary disease) ([place])   . Depression   . Diabetes mellitus   . GERD (gastroesophageal reflux disease)   . Hyperlipidemia   . Hypertension   . Osteoporosis   . Schizophrenia, schizo-affective ([place])   . SOB (shortness of breath) on exertion   . Syncope 06/11/2019     History reviewed. No pertinent family history.   Social History   Socioeconomic History  . Marital status: Single    Spouse name: Not on file  . Number of children: Not on file  . Years of education: Not on file  . Highest education level: Not on file  Occupational History  . Not on file  Social Needs  . Financial resource strain: Not on file  . Food insecurity    Worry: Not on file  Inability: Not on file  . Transportation needs    Medical: Not on file    Non-medical: Not on file  Tobacco Use  . Smoking status: Former Smoker    Years: 15.00    Types: Cigarettes    Quit date: 02/28/2012    Years since quitting: 7.6  . Smokeless tobacco: Former Systems developer    Types: Chew  Substance and Sexual Activity  . Alcohol use: Yes    Comment: "socially"  . Drug use: No  . Sexual activity: Not Currently  Lifestyle  . Physical activity    Days per week: Not on file    Minutes per session: Not on file  .  Stress: Not on file  Relationships  . Social Herbalist on phone: Not on file    Gets together: Not on file    Attends religious service: Not on file    Active member of club or organization: Not on file    Attends meetings of clubs or organizations: Not on file    Relationship status: Not on file  . Intimate partner violence    Fear of current or ex partner: Not on file    Emotionally abused: Not on file    Physically abused: Not on file    Forced sexual activity: Not on file  Other Topics Concern  . Not on file  Social History Narrative  . Not on file     Allergies  Allergen Reactions  . Crestor [Rosuvastatin]     Muscle aches.      Outpatient Medications Prior to Visit  Medication Sig Dispense Refill  . alendronate (FOSAMAX) 70 MG tablet Take 1 tablet by mouth once a week.    . calcium carbonate (OS-CAL) 600 MG TABS Take 600 mg by mouth 2 (two) times daily with a meal.      . clozapine (CLOZARIL) 200 MG tablet Take 1 tablet by mouth at bedtime.    . diclofenac Sodium (VOLTAREN) 1 % GEL     . donepezil (ARICEPT) 10 MG tablet Take 10 mg by mouth at bedtime.      . famotidine (PEPCID) 20 MG tablet Take 1 tablet by mouth 2 (two) times daily.    . fish oil-omega-3 fatty acids 1000 MG capsule Take 1 g by mouth every morning.    . metFORMIN (GLUCOPHAGE) 500 MG tablet Take 500 mg by mouth 2 (two) times daily with a meal.     . omeprazole (PRILOSEC) 20 MG capsule 20 mg daily.     Glory Rosebush Delica Lancets [id number] MISC     . ONETOUCH VERIO test strip     . permethrin (ELIMITE) 5 % cream APPLY TO SCALP AND WASH OFF AFTER 12 HOURS. MAY REPEAT IN 7 DAYS AS NEEDED    . ramipril (ALTACE) 2.5 MG capsule Take 2.5 mg by mouth daily.     . vitamin C (ASCORBIC ACID) 500 MG tablet Take 500 mg by mouth every morning.    Marland Kitchen VITAMIN D, CHOLECALCIFEROL, PO Take 600 Units by mouth daily.      Marland Kitchen albuterol (VENTOLIN HFA) 108 (90 Base) MCG/ACT inhaler INHALE TWO PUFFS BY MOUTH INTO THE LUNGS  EVERY 4 HOURS AS NEEDED FOR WHEEZING OR SHORTNESS OF BREATH 18 g 2  . SYMBICORT 160-4.5 MCG/ACT inhaler INHALE TWO PUFFS BY MOUTH INTO THE LUNGS TWICE A DAY 1 Inhaler 2   No facility-administered medications prior to visit.       Review of Systems  Constitutional: Negative for fever.  HENT: Negative for ear pain, rhinorrhea and sore throat.   Respiratory: Positive for shortness of breath. Negative for hemoptysis, sputum production and wheezing.   Cardiovascular: Negative for chest pain, orthopnea, claudication, leg swelling, syncope and PND.  Gastrointestinal: Negative for abdominal pain and vomiting.  Musculoskeletal: Negative for neck pain.  Skin: Negative for rash.  Neurological: Negative for headaches.       Objective:   Physical Exam Vitals:   10/14/19 0912  BP: 127/78  Pulse: 92  Resp: 18  Temp: 98.8 F (37.1 C)  TempSrc: Oral  SpO2: 98%  Weight: 168 lb (76.2 kg)  Height: 5\' 2"  (1.575 m)    Gen: Pleasant, well-nourished, in no distress,  normal affect  ENT: No lesions,  mouth clear,  oropharynx clear, no postnasal drip  Neck: No JVD, no TMG, no carotid bruits  Lungs: No use of accessory muscles, no dullness to percussion, improved breath sounds without wheezes   cardiovascular: RRR, heart sounds normal, no murmur or gallops, no peripheral edema  Abdomen: soft and NT, no HSM,  BS normal  Musculoskeletal: No deformities, no cyanosis or clubbing  Neuro: alert, non focal  Skin: Warm, no lesions or rashes  EKG is obtained at this visit and showed normal sinus rhythm and mild voltage changes compatible with left ventricular hypertrophy  Prev Hemoglobin A1c 5.8 >>>10/14/2019 5.9  - PFTs 10/11/2011 FEV1  1.76 (86%) ratio 77 and no better with B2,  DLC0 66 corrects 111   5/11 CXR: IMPRESSION: No active cardiopulmonary disease.  Improved aeration from priors.     Assessment & Plan:  I personally reviewed all images and lab data in the [facility] system as  well as any outside material available during this office visit and agree with the  radiology impressions.   Syncope History of syncopal event which is now resolved  Asthma, moderate persistent Moderate persistent asthma stable on Symbicort  We will continue to inhalations twice daily of Symbicort  We will use albuterol as needed   Elizabeth Schwartz was seen today for follow-up.  Diagnoses and all orders for this visit:  Prediabetes -     HgB A1c  Moderate persistent asthma without complication  Controlled type 2 diabetes mellitus without complication, without long-term current use of insulin (HCC)  Syncope, unspecified syncope type  Other orders -     albuterol (VENTOLIN HFA) 108 (90 Base) MCG/ACT inhaler; INHALE TWO PUFFS BY MOUTH INTO THE LUNGS EVERY 4 HOURS AS NEEDED FOR WHEEZING OR SHORTNESS OF BREATH -     SYMBICORT 160-4.5 MCG/ACT inhaler; INHALE TWO PUFFS BY MOUTH INTO THE LUNGS TWICE A DAY

## 2019-10-14 ENCOUNTER — Ambulatory Visit: Payer: Medicare Other | Attending: Critical Care Medicine | Admitting: Critical Care Medicine

## 2019-10-14 ENCOUNTER — Encounter: Payer: Self-pay | Admitting: Critical Care Medicine

## 2019-10-14 ENCOUNTER — Other Ambulatory Visit: Payer: Self-pay

## 2019-10-14 VITALS — BP 127/78 | HR 92 | Temp 98.8°F | Resp 18 | Ht 62.0 in | Wt 168.0 lb

## 2019-10-14 DIAGNOSIS — J4540 Moderate persistent asthma, uncomplicated: Secondary | ICD-10-CM | POA: Diagnosis not present

## 2019-10-14 DIAGNOSIS — E119 Type 2 diabetes mellitus without complications: Secondary | ICD-10-CM | POA: Diagnosis not present

## 2019-10-14 DIAGNOSIS — R7303 Prediabetes: Secondary | ICD-10-CM

## 2019-10-14 DIAGNOSIS — R55 Syncope and collapse: Secondary | ICD-10-CM

## 2019-10-14 LAB — POCT GLYCOSYLATED HEMOGLOBIN (HGB A1C): Hemoglobin A1C: 5.9 % — AB (ref 4.0–5.6)

## 2019-10-14 MED ORDER — SYMBICORT 160-4.5 MCG/ACT IN AERO: INHALATION_SPRAY | RESPIRATORY_TRACT | 4 refills | Status: DC

## 2019-10-14 MED ORDER — ALBUTEROL SULFATE HFA 108 (90 BASE) MCG/ACT IN AERS: INHALATION_SPRAY | RESPIRATORY_TRACT | 2 refills | Status: DC

## 2019-10-14 NOTE — Assessment & Plan Note (Signed)
History of syncopal event which is now resolved

## 2019-10-14 NOTE — Patient Instructions (Signed)
Refills on your inhalers were sent to [facility]  Your hemoglobin A1c was 5.9 which is excellent  Return to see Dr. Joya Gaskins 4 months

## 2019-10-14 NOTE — Assessment & Plan Note (Signed)
Moderate persistent asthma stable on Symbicort  We will continue to inhalations twice daily of Symbicort  We will use albuterol as needed

## 2019-11-19 ENCOUNTER — Ambulatory Visit: Payer: Medicare Other | Admitting: Internal Medicine

## 2019-11-21 ENCOUNTER — Other Ambulatory Visit: Payer: Self-pay

## 2019-11-21 ENCOUNTER — Ambulatory Visit: Payer: Medicare Other | Attending: Internal Medicine | Admitting: Internal Medicine

## 2019-11-21 ENCOUNTER — Encounter: Payer: Self-pay | Admitting: Internal Medicine

## 2019-11-21 VITALS — BP 118/74 | HR 96 | Resp 16 | Wt 166.4 lb

## 2019-11-21 DIAGNOSIS — Z538 Procedure and treatment not carried out for other reasons: Secondary | ICD-10-CM | POA: Diagnosis not present

## 2019-11-21 NOTE — Progress Notes (Signed)
Patient ID: Elizabeth Schwartz, female    DOB: [birth year]  MRN: [id number]  CC: Gynecologic Exam   Subjective: Elizabeth Schwartz is a 74 y.o. female who presents for pap Her concerns today include:  Pt with hx of mod persistent asthma, DM 2, schizophrenia, HTN. Osteoporosis  Pt has a PCP at [facility] [place], Dr. Nolene Ebbs, whom she saw yesterday.  She states she had discussed with him a few months ago about having Pap and he told her that she no longer needs to have Pap smears. [facility] about 5 to 8 years ago. She has not been sexually active for over 25 yrs.  No abnormal paps in past and reports she was getting them regularly No vaginal bleeding. No hx of uterine or ovarian cancer.   Had MMG 07/2019.   Patient Active Problem List   Diagnosis Date Noted  . Diabetes type 2, controlled ([place]) 06/11/2019  . Schizophrenia ([place]) 03/20/2019  . Hypertension 08/22/2016  . Anemia 08/22/2016  . Major neurocognitive disorder due to multiple etiologies with behavioral disturbance ([place]) 08/22/2016  . Osteoporosis 08/22/2016  . Asthma, moderate persistent 09/08/2011  . Diverticulosis 09/08/2011  . Former smoker 09/08/2011  . GERD 06/04/2007     Current Outpatient Medications on File Prior to Visit  Medication Sig Dispense Refill  . albuterol (VENTOLIN HFA) 108 (90 Base) MCG/ACT inhaler INHALE TWO PUFFS BY MOUTH INTO THE LUNGS EVERY 4 HOURS AS NEEDED FOR WHEEZING OR SHORTNESS OF BREATH 18 g 2  . alendronate (FOSAMAX) 70 MG tablet Take 1 tablet by mouth once a week.    . calcium carbonate (OS-CAL) 600 MG TABS Take 600 mg by mouth 2 (two) times daily with a meal.      . clozapine (CLOZARIL) 200 MG tablet Take 1 tablet by mouth at bedtime.    . diclofenac Sodium (VOLTAREN) 1 % GEL     . donepezil (ARICEPT) 10 MG tablet Take 10 mg by mouth at bedtime.      . famotidine (PEPCID) 20 MG tablet Take 1 tablet by mouth 2 (two) times daily.    . fish oil-omega-3  fatty acids 1000 MG capsule Take 1 g by mouth every morning.    . metFORMIN (GLUCOPHAGE) 500 MG tablet Take 500 mg by mouth 2 (two) times daily with a meal.     . omeprazole (PRILOSEC) 20 MG capsule 20 mg daily.     Glory Rosebush Delica Lancets [id number] MISC     . ONETOUCH VERIO test strip     . permethrin (ELIMITE) 5 % cream APPLY TO SCALP AND WASH OFF AFTER 12 HOURS. MAY REPEAT IN 7 DAYS AS NEEDED    . ramipril (ALTACE) 2.5 MG capsule Take 2.5 mg by mouth daily.     . SYMBICORT 160-4.5 MCG/ACT inhaler INHALE TWO PUFFS BY MOUTH INTO THE LUNGS TWICE A DAY 1 Inhaler 4  . vitamin C (ASCORBIC ACID) 500 MG tablet Take 500 mg by mouth every morning.    Marland Kitchen VITAMIN D, CHOLECALCIFEROL, PO Take 600 Units by mouth daily.       No current facility-administered medications on file prior to visit.    Allergies  Allergen Reactions  . Crestor [Rosuvastatin]     Muscle aches.     Social History   Socioeconomic History  . Marital status: Single    Spouse name: Not on file  . Number of children: Not on file  . Years  of education: Not on file  . Highest education level: Not on file  Occupational History  . Not on file  Tobacco Use  . Smoking status: Former Smoker    Years: 15.00    Types: Cigarettes    Quit date: 02/28/2012    Years since quitting: 7.7  . Smokeless tobacco: Former Systems developer    Types: Chew  Substance and Sexual Activity  . Alcohol use: Yes    Comment: "socially"  . Drug use: No  . Sexual activity: Not Currently  Other Topics Concern  . Not on file  Social History Narrative  . Not on file   Social Determinants of Health   Financial Resource Strain:   . Difficulty of Paying Living Expenses: Not on file  Food Insecurity:   . Worried About Charity fundraiser in the Last Year: Not on file  . Ran Out of Food in the Last Year: Not on file  Transportation Needs:   . Lack of Transportation (Medical): Not on file  . Lack of Transportation (Non-Medical): Not on file  Physical Activity:    . Days of Exercise per Week: Not on file  . Minutes of Exercise per Session: Not on file  Stress:   . Feeling of Stress : Not on file  Social Connections:   . Frequency of Communication with Friends and Family: Not on file  . Frequency of Social Gatherings with Friends and Family: Not on file  . Attends Religious Services: Not on file  . Active Member of Clubs or Organizations: Not on file  . Attends Archivist Meetings: Not on file  . Marital Status: Not on file  Intimate Partner Violence:   . Fear of Current or Ex-Partner: Not on file  . Emotionally Abused: Not on file  . Physically Abused: Not on file  . Sexually Abused: Not on file    No family history on file.  Past Surgical History:  Procedure Laterality Date  . BREAST BIOPSY Right 2017   benign  . LAPAROSCOPY  1975   ABD pain  . VAGINAL HYSTERECTOMY  1975    ROS: Review of Systems Negative except as stated above  PHYSICAL EXAM: BP 118/74   Pulse 96   Resp 16   Wt 166 lb 6.4 oz (75.5 kg)   SpO2 97%   BMI 30.43 kg/m   Physical Exam  General appearance - alert, well appearing, pleasant elderly Caucasian female and in no distress Mental status - normal mood, behavior, speech, dress, motor activity, and thought processes Pelvic exam: Not done because it is not indicated  CMP Latest Ref Rng & Units 03/25/2019 03/01/2019 08/20/2016  Glucose 70 - 99 mg/dL 117(H) 148(H) 130(H)  BUN 8 - 23 mg/dL 22 18 12   Creatinine 0.44 - 1.00 mg/dL 1.18(H) 1.41(H) 0.80  Sodium 135 - 145 mmol/L 141 141 145  Potassium 3.5 - 5.1 mmol/L 4.5 4.2 3.2(L)  Chloride 98 - 111 mmol/L 103 106 109  CO2 22 - 32 mmol/L 23 20(L) -  Calcium 8.9 - 10.3 mg/dL 10.2 9.5 -  Total Protein 6.5 - 8.1 g/dL - - -  Total Bilirubin 0.3 - 1.2 mg/dL - - -  Alkaline Phos 38 - 126 U/L - - -  AST 15 - 41 U/L - - -  ALT 14 - 54 U/L - - -   Lipid Panel     Component Value Date/Time   CHOL 170 02/10/2010 2036   TRIG 198 (H) 02/10/2010 2036  HDL 58 02/10/2010 2036   CHOLHDL 2.9 Ratio 02/10/2010 2036   VLDL 40 02/10/2010 2036   LDLCALC 72 02/10/2010 2036    CBC    Component Value Date/Time   WBC 6.1 03/25/2019 1147   RBC 3.95 03/25/2019 1147   HGB 11.6 (L) 03/25/2019 1147   HCT 37.2 03/25/2019 1147   PLT 243 03/25/2019 1147   MCV 94.2 03/25/2019 1147   MCH 29.4 03/25/2019 1147   MCHC 31.2 03/25/2019 1147   RDW 14.4 03/25/2019 1147   LYMPHSABS 1.4 08/20/2016 1536   MONOABS 0.5 08/20/2016 1536   EOSABS 0.1 08/20/2016 1536   BASOSABS 0.0 08/20/2016 1536    ASSESSMENT AND PLAN: 1. Pap smear of cervix not needed -Advised patient that if she was getting her Pap smears regularly and had not had abnormals in the past she no longer needs to have Pap smears done as she is past the age of 55.  She also does not have history of any gynecologic malignancies.  She plans to continue receiving her primary care through Dr. Jeanie Cooks   Patient was given the opportunity to ask questions.  Patient verbalized understanding of the plan and was able to repeat key elements of the plan.   No orders of the defined types were placed in this encounter.    Requested Prescriptions    No prescriptions requested or ordered in this encounter    No follow-ups on file.  Karle Plumber, MD, FACP

## 2019-11-21 NOTE — Patient Instructions (Signed)
You no longer need to have pap smears.

## 2019-12-24 ENCOUNTER — Ambulatory Visit: Payer: Medicare Other | Attending: Internal Medicine

## 2019-12-24 ENCOUNTER — Other Ambulatory Visit: Payer: Medicare Other

## 2019-12-24 DIAGNOSIS — Z23 Encounter for immunization: Secondary | ICD-10-CM

## 2019-12-24 NOTE — Progress Notes (Signed)
   Covid-19 Vaccination Clinic  Name:  Elizabeth Schwartz    MRN: [id number] DOB: [birth year]  12/24/2019  [name] was observed post Covid-19 immunization for 15 minutes without incidence. She was provided with Vaccine Information Sheet and instruction to access the V-Safe system.   [name] was instructed to call 911 with any severe reactions post vaccine: Marland Kitchen Difficulty breathing  . Swelling of your face and throat  . A fast heartbeat  . A bad rash all over your body  . Dizziness and weakness    Immunizations Administered    Name Date Dose VIS Date Route   Pfizer COVID-19 Vaccine 12/24/2019  1:57 PM 0.3 mL 10/25/2019 Intramuscular   Manufacturer: [place]   Lot: [place] [id number]   [place]: [id number]

## 2020-01-03 ENCOUNTER — Other Ambulatory Visit: Payer: Self-pay | Admitting: Critical Care Medicine

## 2020-01-21 ENCOUNTER — Ambulatory Visit: Payer: Medicare Other | Attending: Internal Medicine

## 2020-01-21 DIAGNOSIS — Z23 Encounter for immunization: Secondary | ICD-10-CM | POA: Insufficient documentation

## 2020-01-21 NOTE — Progress Notes (Signed)
   Covid-19 Vaccination Clinic  Name:  Elizabeth Schwartz    MRN: [id number] DOB: Nov 10, 1946  01/21/2020  [name] was observed post Covid-19 immunization for 15 minutes without incident. She was provided with Vaccine Information Sheet and instruction to access the V-Safe system.   [name] was instructed to call 911 with any severe reactions post vaccine: Marland Kitchen Difficulty breathing  . Swelling of face and throat  . A fast heartbeat  . A bad rash all over body  . Dizziness and weakness   Immunizations Administered    Name Date Dose VIS Date Route   Pfizer COVID-19 Vaccine 01/21/2020  9:39 AM 0.3 mL 10/25/2019 Intramuscular   Manufacturer: [place]   Lot: [id number]   [place]: [id number]

## 2020-02-10 NOTE — Progress Notes (Signed)
Subjective:    Patient ID: Elizabeth Schwartz, female    DOB: 1946-03-15, 74 y.o.   MRN: [id number]  This is a 74 year old female history of asthma and poss COPD and former smoker.    I last saw this patient in May 2020 and began the patient on the Symbicort 160 at 2 inhalations twice daily.  Note that pulmonary function studies have shown mild reduction in FEV1 and FVC ratio without change in bronchodilators.  Also the patient has not had desaturation with exertion.  The patient also has as needed albuterol.  The patient is an ex-smoker.  Patient did have issues with syncope and presyncope over the last several months.  Particularly in the high heat the patient had spells of dizziness and lightheadedness.  EMS was called at 1 point EKG was unremarkable.  The patient denies any chest pain at this time or cough or mucus production.  She does tell me now that she does have a primary care provider who follows her regularly and also she goes to [facility] for her mental health care  She was diagnosed with asthma and COPD overlap syndrome in 2013 by another pulmonary physician.  Pulmonary functions done at that time showed no significant obstruction and DLCO was preserved.  The patient's not on oxygen at this time.  Note the patient does monitor her blood sugars and they have been in the 80-1 10 range  11/30: Since the last visit the patient's asthma has been under good control.  She is using Symbicort 2 inhalations twice daily and has not required albuterol.  She has had no further exacerbations of her asthma.  Note she does follow another primary care provider who is managing her diabetes hypertension and other medical conditions.  She is coming to our clinic for pulmonary management only.    02/11/2020 the patient returns in follow-up today for her pulmonary management of asthma.  She is not having active complaints.  See asthma assessment below.  Note she does have another primary care provider who is  following her for other medical problems.  Shortness of Breath This is a chronic problem. The current episode started more than 1 year ago. The problem occurs every several days (exertional only   ). The problem has been unchanged. Pertinent negatives include no abdominal pain, chest pain, claudication, coryza, ear pain, fever, headaches, hemoptysis, leg pain, leg swelling, neck pain, orthopnea, PND, rash, rhinorrhea, sore throat, sputum production, swollen glands, syncope, vomiting or wheezing. The symptoms are aggravated by any activity, exercise and pollens. Associated symptoms comments: Dry cough, no wheeze. Risk factors include smoking. She has tried beta agonist inhalers and steroid inhalers for the symptoms. The treatment provided significant relief. Her past medical history is significant for asthma, COPD and pneumonia. There is no history of allergies, CAD, DVT, a heart failure or PE.   Past Medical History:  Diagnosis Date  . Anemia   . COPD (chronic obstructive pulmonary disease) ([place])   . Depression   . Diabetes mellitus   . GERD (gastroesophageal reflux disease)   . Hyperlipidemia   . Hypertension   . Osteoporosis   . Schizophrenia, schizo-affective ([place])   . SOB (shortness of breath) on exertion   . Syncope 06/11/2019     History reviewed. No pertinent family history.   Social History   Socioeconomic History  . Marital status: Single    Spouse name: Not on file  . Number of children: Not on file  . Years of education:  Not on file  . Highest education level: Not on file  Occupational History  . Not on file  Tobacco Use  . Smoking status: Former Smoker    Years: 15.00    Types: Cigarettes    Quit date: 02/28/2012    Years since quitting: 7.9  . Smokeless tobacco: Former Systems developer    Types: Chew  Substance and Sexual Activity  . Alcohol use: Yes    Comment: "socially"  . Drug use: No  . Sexual activity: Not Currently  Other Topics Concern  . Not on file  Social  History Narrative  . Not on file   Social Determinants of Health   Financial Resource Strain:   . Difficulty of Paying Living Expenses:   Food Insecurity:   . Worried About Charity fundraiser in the Last Year:   . Arboriculturist in the Last Year:   Transportation Needs:   . Film/video editor (Medical):   Marland Kitchen Lack of Transportation (Non-Medical):   Physical Activity:   . Days of Exercise per Week:   . Minutes of Exercise per Session:   Stress:   . Feeling of Stress :   Social Connections:   . Frequency of Communication with Friends and Family:   . Frequency of Social Gatherings with Friends and Family:   . Attends Religious Services:   . Active Member of Clubs or Organizations:   . Attends Archivist Meetings:   Marland Kitchen Marital Status:   Intimate Partner Violence:   . Fear of Current or Ex-Partner:   . Emotionally Abused:   Marland Kitchen Physically Abused:   . Sexually Abused:      Allergies  Allergen Reactions  . Crestor [Rosuvastatin]     Muscle aches.      Outpatient Medications Prior to Visit  Medication Sig Dispense Refill  . acetaminophen (TYLENOL) 500 MG tablet Take 500 mg by mouth 2 (two) times daily.    Marland Kitchen albuterol (VENTOLIN HFA) 108 (90 Base) MCG/ACT inhaler INHALE TWO PUFFS BY MOUTH INTO THE LUNGS EVERY 4 HOURS AS NEEDED FOR WHEEZING OR SHORTNESS OF BREATH 18 g 2  . alendronate (FOSAMAX) 70 MG tablet Take 1 tablet by mouth once a week.    . calcium carbonate (OS-CAL) 600 MG TABS Take 600 mg by mouth 2 (two) times daily with a meal.      . clozapine (CLOZARIL) 200 MG tablet Take 1 tablet by mouth at bedtime. Take 1 tablet every morning and 2 tablets at bedtime    . diclofenac Sodium (VOLTAREN) 1 % GEL     . donepezil (ARICEPT) 10 MG tablet Take 10 mg by mouth at bedtime.      . famotidine (PEPCID) 20 MG tablet Take 1 tablet by mouth 2 (two) times daily.    . fenofibrate 160 MG tablet Take 160 mg by mouth daily.    . fish oil-omega-3 fatty acids 1000 MG capsule  Take 1 g by mouth every morning.    . metFORMIN (GLUCOPHAGE) 500 MG tablet Take 500 mg by mouth 2 (two) times daily with a meal.     . omeprazole (PRILOSEC) 20 MG capsule 20 mg daily.     Glory Rosebush Delica Lancets [id number] MISC     . ONETOUCH VERIO test strip     . permethrin (ELIMITE) 5 % cream APPLY TO SCALP AND WASH OFF AFTER 12 HOURS. MAY REPEAT IN 7 DAYS AS NEEDED    . ramipril (ALTACE) 2.5 MG capsule  Take 2.5 mg by mouth daily.     . SYMBICORT 160-4.5 MCG/ACT inhaler INHALE TWO PUFFS BY MOUTH INTO THE LUNGS TWICE A DAY 1 Inhaler 5  . vitamin C (ASCORBIC ACID) 500 MG tablet Take 500 mg by mouth every morning.    Marland Kitchen VITAMIN D, CHOLECALCIFEROL, PO Take 600 Units by mouth daily.       No facility-administered medications prior to visit.      Review of Systems  Constitutional: Negative for fever.  HENT: Negative for ear pain, rhinorrhea and sore throat.   Respiratory: Positive for shortness of breath. Negative for hemoptysis, sputum production and wheezing.   Cardiovascular: Negative for chest pain, orthopnea, claudication, leg swelling, syncope and PND.  Gastrointestinal: Negative for abdominal pain and vomiting.  Musculoskeletal: Negative for neck pain.  Skin: Negative for rash.  Neurological: Negative for headaches.       Objective:   Physical Exam Vitals:   02/11/20 0929  BP: 134/73  Pulse: 68  Temp: 97.6 F (36.4 C)  TempSrc: Temporal  Weight: 170 lb (77.1 kg)  Height: 5\' 3"  (1.6 m)    Gen: Pleasant, well-nourished, in no distress,  normal affect  ENT: No lesions,  mouth clear,  oropharynx clear, no postnasal drip  Neck: No JVD, no TMG, no carotid bruits  Lungs: No use of accessory muscles, no dullness to percussion, improved breath sounds without wheezes   cardiovascular: RRR, heart sounds normal, no murmur or gallops, no peripheral edema  Abdomen: soft and NT, no HSM,  BS normal  Musculoskeletal: No deformities, no cyanosis or clubbing  Neuro: alert, non focal   Skin: Warm, no lesions or rashes    - PFTs 10/11/2011 FEV1  1.76 (86%) ratio 77 and no better with B2,  DLC0 66 corrects 111   5/11 CXR: IMPRESSION: No active cardiopulmonary disease.  Improved aeration from priors.     Assessment & Plan:  I personally reviewed all images and lab data in the [facility] system as well as any outside material available during this office visit and agree with the  radiology impressions.   Asthma, moderate persistent Moderate persistent asthma with good control at this time and improved HFA technique  Plan is to continue Symbicort at 2 inhalations twice daily and as needed albuterol  Other management as per primary care   Man was seen today for follow-up.  Diagnoses and all orders for this visit:  Moderate persistent asthma without complication  Controlled type 2 diabetes mellitus without complication, without long-term current use of insulin (HCC) -     Glucose (CBG)  Note blood glucose today was registered at 152 postprandial  She knows to follow-up with her primary care provider in this regard regarding her diabetes note her last A1c was 5.9

## 2020-02-11 ENCOUNTER — Encounter: Payer: Self-pay | Admitting: Critical Care Medicine

## 2020-02-11 ENCOUNTER — Ambulatory Visit: Payer: Medicare Other | Attending: Critical Care Medicine | Admitting: Critical Care Medicine

## 2020-02-11 ENCOUNTER — Other Ambulatory Visit: Payer: Self-pay

## 2020-02-11 VITALS — BP 134/73 | HR 68 | Temp 97.6°F | Ht 63.0 in | Wt 170.0 lb

## 2020-02-11 DIAGNOSIS — J4540 Moderate persistent asthma, uncomplicated: Secondary | ICD-10-CM

## 2020-02-11 DIAGNOSIS — R0602 Shortness of breath: Secondary | ICD-10-CM

## 2020-02-11 DIAGNOSIS — E119 Type 2 diabetes mellitus without complications: Secondary | ICD-10-CM | POA: Diagnosis not present

## 2020-02-11 LAB — GLUCOSE, POCT (MANUAL RESULT ENTRY): POC Glucose: 157 mg/dl — AB (ref 70–99)

## 2020-02-11 NOTE — Assessment & Plan Note (Signed)
Moderate persistent asthma with good control at this time and improved HFA technique  Plan is to continue Symbicort at 2 inhalations twice daily and as needed albuterol  Other management as per primary care

## 2020-02-11 NOTE — Patient Instructions (Signed)
No change in asthma medications Return to see Dr Joya Gaskins in 6 months

## 2020-04-02 ENCOUNTER — Other Ambulatory Visit: Payer: Self-pay | Admitting: Critical Care Medicine

## 2020-04-07 ENCOUNTER — Other Ambulatory Visit: Payer: Self-pay

## 2020-04-07 ENCOUNTER — Encounter: Payer: Self-pay | Admitting: Internal Medicine

## 2020-04-07 ENCOUNTER — Ambulatory Visit: Payer: Medicare Other | Attending: Internal Medicine | Admitting: Internal Medicine

## 2020-04-07 VITALS — BP 100/67 | HR 115 | Temp 98.2°F | Resp 16 | Wt 167.2 lb

## 2020-04-07 DIAGNOSIS — Z538 Procedure and treatment not carried out for other reasons: Secondary | ICD-10-CM | POA: Diagnosis not present

## 2020-04-07 NOTE — Patient Instructions (Signed)
You no longer need to have Pap smears done.

## 2020-04-07 NOTE — Progress Notes (Signed)
Patient ID: Elizabeth Schwartz, female    DOB: 1946/06/27  MRN: [id number]  CC: Gynecologic Exam   Subjective: Elizabeth Schwartz is a 74 y.o. female who presents for pap Her concerns today include:  Hx of HTN, asthma, DM  This elderly lady presents today for Pap smear.  I had actually seen her back in January of this year for the same request and had determined that she no longer needs Pap smears.  Patient states that she misunderstood and thought that she needed 1.  She reports having had a partial hysterectomy many years ago for endometriosis [facility] about 5 to 8 years ago. She has not been sexually active for over 25 yrs.  No abnormal paps in past and reports she was getting them regularly No vaginal bleeding or discharge. No hx of uterine or ovarian cancer.   Had MMG 07/2019.   Patient Active Problem List   Diagnosis Date Noted  . Diabetes type 2, controlled ([place]) 06/11/2019  . Schizophrenia ([place]) 03/20/2019  . Hypertension 08/22/2016  . Anemia 08/22/2016  . Major neurocognitive disorder due to multiple etiologies with behavioral disturbance ([place]) 08/22/2016  . Osteoporosis 08/22/2016  . Asthma, moderate persistent 09/08/2011  . Diverticulosis 09/08/2011  . Former smoker 09/08/2011  . GERD 06/04/2007     Current Outpatient Medications on File Prior to Visit  Medication Sig Dispense Refill  . acetaminophen (TYLENOL) 500 MG tablet Take 500 mg by mouth 2 (two) times daily.    Marland Kitchen albuterol (VENTOLIN HFA) 108 (90 Base) MCG/ACT inhaler INHALE TWO PUFFS BY MOUTH INTO THE LUNGS EVERY 4 HOURS AS NEEDED FOR WHEEZING OR SHORTNESS OF BREATH 18 g 2  . alendronate (FOSAMAX) 70 MG tablet Take 1 tablet by mouth once a week.    . calcium carbonate (OS-CAL) 600 MG TABS Take 600 mg by mouth 2 (two) times daily with a meal.      . clozapine (CLOZARIL) 200 MG tablet Take 1 tablet by mouth at bedtime. Take 1 tablet every morning and 2 tablets at bedtime    .  diclofenac Sodium (VOLTAREN) 1 % GEL     . donepezil (ARICEPT) 10 MG tablet Take 10 mg by mouth at bedtime.      . famotidine (PEPCID) 20 MG tablet Take 1 tablet by mouth 2 (two) times daily.    . fenofibrate 160 MG tablet Take 160 mg by mouth daily.    . fish oil-omega-3 fatty acids 1000 MG capsule Take 1 g by mouth every morning.    . metFORMIN (GLUCOPHAGE) 500 MG tablet Take 500 mg by mouth 2 (two) times daily with a meal.     . omeprazole (PRILOSEC) 20 MG capsule 20 mg daily.     Glory Rosebush Delica Lancets [id number] MISC     . ONETOUCH VERIO test strip     . permethrin (ELIMITE) 5 % cream APPLY TO SCALP AND WASH OFF AFTER 12 HOURS. MAY REPEAT IN 7 DAYS AS NEEDED    . ramipril (ALTACE) 2.5 MG capsule Take 2.5 mg by mouth daily.     . SYMBICORT 160-4.5 MCG/ACT inhaler INHALE TWO PUFFS BY MOUTH INTO THE LUNGS TWICE A DAY 1 Inhaler 5  . vitamin C (ASCORBIC ACID) 500 MG tablet Take 500 mg by mouth every morning.    Marland Kitchen VITAMIN D, CHOLECALCIFEROL, PO Take 600 Units by mouth daily.       No current facility-administered medications on file  prior to visit.    Allergies  Allergen Reactions  . Crestor [Rosuvastatin]     Muscle aches.     Social History   Socioeconomic History  . Marital status: Single    Spouse name: Not on file  . Number of children: Not on file  . Years of education: Not on file  . Highest education level: Not on file  Occupational History  . Not on file  Tobacco Use  . Smoking status: Former Smoker    Years: 15.00    Types: Cigarettes    Quit date: 02/28/2012    Years since quitting: 8.1  . Smokeless tobacco: Former Systems developer    Types: Chew  Substance and Sexual Activity  . Alcohol use: Yes    Comment: "socially"  . Drug use: No  . Sexual activity: Not Currently  Other Topics Concern  . Not on file  Social History Narrative  . Not on file   Social Determinants of Health   Financial Resource Strain:   . Difficulty of Paying Living Expenses:   Food Insecurity:     . Worried About Charity fundraiser in the Last Year:   . Arboriculturist in the Last Year:   Transportation Needs:   . Film/video editor (Medical):   Marland Kitchen Lack of Transportation (Non-Medical):   Physical Activity:   . Days of Exercise per Week:   . Minutes of Exercise per Session:   Stress:   . Feeling of Stress :   Social Connections:   . Frequency of Communication with Friends and Family:   . Frequency of Social Gatherings with Friends and Family:   . Attends Religious Services:   . Active Member of Clubs or Organizations:   . Attends Archivist Meetings:   Marland Kitchen Marital Status:   Intimate Partner Violence:   . Fear of Current or Ex-Partner:   . Emotionally Abused:   Marland Kitchen Physically Abused:   . Sexually Abused:     No family history on file.  Past Surgical History:  Procedure Laterality Date  . BREAST BIOPSY Right 2017   benign  . LAPAROSCOPY  1975   ABD pain  . VAGINAL HYSTERECTOMY  1975    ROS: Review of Systems Negative except as stated above  PHYSICAL EXAM: BP 100/67   Pulse (!) 115   Temp 98.2 F (36.8 C)   Resp 16   Wt 167 lb 3.2 oz (75.8 kg)   SpO2 95%   BMI 29.62 kg/m   Physical Exam  General appearance - alert, well appearing, pleasant elderly female and in no distress Mental status - normal mood, behavior, speech, dress, motor activity, and thought processes Chest - clear to auscultation, no wheezes, rales or rhonchi, symmetric air entry Heart - normal rate, regular rhythm, normal S1, S2, no murmurs, rubs, clicks or gallops   CMP Latest Ref Rng & Units 03/25/2019 03/01/2019 08/20/2016  Glucose 70 - 99 mg/dL 117(H) 148(H) 130(H)  BUN 8 - 23 mg/dL 22 18 12   Creatinine 0.44 - 1.00 mg/dL 1.18(H) 1.41(H) 0.80  Sodium 135 - 145 mmol/L 141 141 145  Potassium 3.5 - 5.1 mmol/L 4.5 4.2 3.2(L)  Chloride 98 - 111 mmol/L 103 106 109  CO2 22 - 32 mmol/L 23 20(L) -  Calcium 8.9 - 10.3 mg/dL 10.2 9.5 -  Total Protein 6.5 - 8.1 g/dL - - -  Total  Bilirubin 0.3 - 1.2 mg/dL - - -  Alkaline Phos 38 -  126 U/L - - -  AST 15 - 41 U/L - - -  ALT 14 - 54 U/L - - -   Lipid Panel     Component Value Date/Time   CHOL 170 02/10/2010 2036   TRIG 198 (H) 02/10/2010 2036   HDL 58 02/10/2010 2036   CHOLHDL 2.9 Ratio 02/10/2010 2036   VLDL 40 02/10/2010 2036   LDLCALC 72 02/10/2010 2036    CBC    Component Value Date/Time   WBC 6.1 03/25/2019 1147   RBC 3.95 03/25/2019 1147   HGB 11.6 (L) 03/25/2019 1147   HCT 37.2 03/25/2019 1147   PLT 243 03/25/2019 1147   MCV 94.2 03/25/2019 1147   MCH 29.4 03/25/2019 1147   MCHC 31.2 03/25/2019 1147   RDW 14.4 03/25/2019 1147   LYMPHSABS 1.4 08/20/2016 1536   MONOABS 0.5 08/20/2016 1536   EOSABS 0.1 08/20/2016 1536   BASOSABS 0.0 08/20/2016 1536    ASSESSMENT AND PLAN: 1. Pap smear of cervix not needed Advised patient that based on her history and age she no longer needs Pap smears.  She tells me that she continues to follow with Dr. Jeanie Cooks and was last seen about 2 to 3 months ago.   Patient was given the opportunity to ask questions.  Patient verbalized understanding of the plan and was able to repeat key elements of the plan.   No orders of the defined types were placed in this encounter.    Requested Prescriptions    No prescriptions requested or ordered in this encounter    No follow-ups on file.  Karle Plumber, MD, FACP

## 2020-06-09 ENCOUNTER — Other Ambulatory Visit: Payer: Self-pay | Admitting: Internal Medicine

## 2020-06-09 DIAGNOSIS — Z1231 Encounter for screening mammogram for malignant neoplasm of breast: Secondary | ICD-10-CM

## 2020-07-16 ENCOUNTER — Other Ambulatory Visit: Payer: Self-pay

## 2020-07-16 ENCOUNTER — Ambulatory Visit
Admission: RE | Admit: 2020-07-16 | Discharge: 2020-07-16 | Disposition: A | Discharge status: Home / Self Care | Payer: Medicare Other | Source: Ambulatory Visit | Attending: Internal Medicine | Admitting: Internal Medicine

## 2020-07-16 DIAGNOSIS — Z1231 Encounter for screening mammogram for malignant neoplasm of breast: Secondary | ICD-10-CM

## 2020-08-11 ENCOUNTER — Ambulatory Visit: Payer: Medicare Other | Admitting: Podiatry

## 2020-08-12 ENCOUNTER — Ambulatory Visit: Payer: Medicare Other | Admitting: Podiatry

## 2020-08-31 NOTE — Progress Notes (Signed)
Subjective:    Patient ID: Elizabeth Schwartz, female    DOB: Jan 31, 1946, 74 y.o.   MRN: [id number]  04/03/19 This is a 74 year old female history of asthma and poss COPD and former smoker.    I last saw this patient in May 2020 and began the patient on the Symbicort 160 at 2 inhalations twice daily.  Note that pulmonary function studies have shown mild reduction in FEV1 and FVC ratio without change in bronchodilators.  Also the patient has not had desaturation with exertion.  The patient also has as needed albuterol.  The patient is an ex-smoker.  Patient did have issues with syncope and presyncope over the last several months.  Particularly in the high heat the patient had spells of dizziness and lightheadedness.  EMS was called at 1 point EKG was unremarkable.  The patient denies any chest pain at this time or cough or mucus production.  She does tell me now that she does have a primary care provider who follows her regularly and also she goes to [facility] for her mental health care  She was diagnosed with asthma and COPD overlap syndrome in 2013 by another pulmonary physician.  Pulmonary functions done at that time showed no significant obstruction and DLCO was preserved.  The patient's not on oxygen at this time.  Note the patient does monitor her blood sugars and they have been in the 80-1 10 range  11/30: Since the last visit the patient's asthma has been under good control.  She is using Symbicort 2 inhalations twice daily and has not required albuterol.  She has had no further exacerbations of her asthma.  Note she does follow another primary care provider who is managing her diabetes hypertension and other medical conditions.  She is coming to our clinic for pulmonary management only.    02/11/2020 the patient returns in follow-up today for her pulmonary management of asthma.  She is not having active complaints.  See asthma assessment below.  Note she does have another primary care provider  who is following her for other medical problems.  09/01/2020 The patient returns in follow-up for her asthma and I assumed at the last visit she was establishing here for primary care but she informed me today she is in the process of looking for a new primary care provider from her former 1.  Patient does have the diabetes comes in on arrival blood sugar is 119 A1c was 5.9  Patient has no new complaints at this time blood pressures well controlled on arrival is 104/68  She has no new symptom complex  Shortness of Breath This is a chronic problem. The current episode started more than 1 year ago. The problem occurs every several days (exertional only   ). The problem has been unchanged. Pertinent negatives include no abdominal pain, chest pain, claudication, coryza, ear pain, fever, headaches, hemoptysis, leg pain, leg swelling, neck pain, orthopnea, PND, rash, rhinorrhea, sore throat, sputum production, swollen glands, syncope, vomiting or wheezing. The symptoms are aggravated by any activity, exercise and pollens. Associated symptoms comments: Dry cough, no wheeze. Risk factors include smoking. She has tried beta agonist inhalers and steroid inhalers for the symptoms. The treatment provided significant relief. Her past medical history is significant for asthma, COPD and pneumonia. There is no history of allergies, CAD, DVT, a heart failure or PE.   Past Medical History:  Diagnosis Date  . Anemia   . COPD (chronic obstructive pulmonary disease) ([place])   . Depression   .  Diabetes mellitus   . GERD (gastroesophageal reflux disease)   . Hyperlipidemia   . Hypertension   . Osteoporosis   . Schizophrenia, schizo-affective ([place])   . SOB (shortness of breath) on exertion   . Syncope 06/11/2019     History reviewed. No pertinent family history.   Social History   Socioeconomic History  . Marital status: Single    Spouse name: Not on file  . Number of children: Not on file  . Years of  education: Not on file  . Highest education level: Not on file  Occupational History  . Not on file  Tobacco Use  . Smoking status: Former Smoker    Years: 15.00    Types: Cigarettes    Quit date: 02/28/2012    Years since quitting: 8.5  . Smokeless tobacco: Former Systems developer    Types: Secondary school teacher  . Vaping Use: Never used  Substance and Sexual Activity  . Alcohol use: Yes    Comment: "socially"  . Drug use: No  . Sexual activity: Not Currently  Other Topics Concern  . Not on file  Social History Narrative  . Not on file   Social Determinants of Health   Financial Resource Strain:   . Difficulty of Paying Living Expenses: Not on file  Food Insecurity:   . Worried About Charity fundraiser in the Last Year: Not on file  . Ran Out of Food in the Last Year: Not on file  Transportation Needs:   . Lack of Transportation (Medical): Not on file  . Lack of Transportation (Non-Medical): Not on file  Physical Activity:   . Days of Exercise per Week: Not on file  . Minutes of Exercise per Session: Not on file  Stress:   . Feeling of Stress : Not on file  Social Connections:   . Frequency of Communication with Friends and Family: Not on file  . Frequency of Social Gatherings with Friends and Family: Not on file  . Attends Religious Services: Not on file  . Active Member of Clubs or Organizations: Not on file  . Attends Archivist Meetings: Not on file  . Marital Status: Not on file  Intimate Partner Violence:   . Fear of Current or Ex-Partner: Not on file  . Emotionally Abused: Not on file  . Physically Abused: Not on file  . Sexually Abused: Not on file     Allergies  Allergen Reactions  . Crestor [Rosuvastatin]     Muscle aches.      Outpatient Medications Prior to Visit  Medication Sig Dispense Refill  . acetaminophen (TYLENOL) 500 MG tablet Take 500 mg by mouth 2 (two) times daily.    Marland Kitchen albuterol (VENTOLIN HFA) 108 (90 Base) MCG/ACT inhaler INHALE TWO  PUFFS BY MOUTH INTO THE LUNGS EVERY 4 HOURS AS NEEDED FOR WHEEZING OR SHORTNESS OF BREATH 18 g 2  . alendronate (FOSAMAX) 70 MG tablet Take 1 tablet by mouth once a week.    . calcium carbonate (OS-CAL) 600 MG TABS Take 600 mg by mouth 2 (two) times daily with a meal.      . clozapine (CLOZARIL) 200 MG tablet Take 1 tablet by mouth at bedtime.     . diclofenac Sodium (VOLTAREN) 1 % GEL     . donepezil (ARICEPT) 10 MG tablet Take 10 mg by mouth at bedtime.      . famotidine (PEPCID) 20 MG tablet Take 1 tablet by mouth 2 (two) times  daily.    . fish oil-omega-3 fatty acids 1000 MG capsule Take 1 g by mouth every morning.    Marland Kitchen omeprazole (PRILOSEC) 20 MG capsule 20 mg daily.     Glory Rosebush Delica Lancets 98X MISC     . ONETOUCH VERIO test strip     . permethrin (ELIMITE) 5 % cream APPLY TO SCALP AND WASH OFF AFTER 12 HOURS. MAY REPEAT IN 7 DAYS AS NEEDED    . vitamin C (ASCORBIC ACID) 500 MG tablet Take 500 mg by mouth every morning.    Marland Kitchen VITAMIN D, CHOLECALCIFEROL, PO Take 600 Units by mouth daily.      . fenofibrate 160 MG tablet Take 160 mg by mouth daily.    . metFORMIN (GLUCOPHAGE) 500 MG tablet Take 500 mg by mouth 2 (two) times daily with a meal.     . ramipril (ALTACE) 2.5 MG capsule Take 2.5 mg by mouth daily.     . SYMBICORT 160-4.5 MCG/ACT inhaler INHALE TWO PUFFS BY MOUTH INTO THE LUNGS TWICE A DAY 1 Inhaler 5   No facility-administered medications prior to visit.      Review of Systems  Constitutional: Negative for fever.  HENT: Negative for ear pain, rhinorrhea and sore throat.   Respiratory: Negative for hemoptysis, sputum production, shortness of breath and wheezing.   Cardiovascular: Negative for chest pain, orthopnea, claudication, leg swelling, syncope and PND.  Gastrointestinal: Negative for abdominal pain and vomiting.  Musculoskeletal: Negative for neck pain.  Skin: Negative for rash.  Neurological: Negative for headaches.       Objective:   Physical  Exam Vitals:   09/01/20 1358  BP: 104/68  Pulse: (!) 112  Temp: 98.2 F (36.8 C)  SpO2: 96%  Weight: 166 lb (75.3 kg)  Height: 5\' 4"  (1.626 m)    Gen: Pleasant, well-nourished, in no distress,  normal affect  ENT: No lesions,  mouth clear,  oropharynx clear, no postnasal drip  Neck: No JVD, no TMG, no carotid bruits  Lungs: No use of accessory muscles, no dullness to percussion, improved breath sounds without wheezes   cardiovascular: RRR, heart sounds normal, no murmur or gallops, no peripheral edema  Abdomen: soft and NT, no HSM,  BS normal  Musculoskeletal: No deformities, no cyanosis or clubbing  Neuro: alert, non focal  Skin: Warm, no lesions or rashes    - PFTs 10/11/2011 FEV1  1.76 (86%) ratio 77 and no better with B2,  DLC0 66 corrects 111   5/11 CXR: IMPRESSION: No active cardiopulmonary disease.  Improved aeration from priors.     Assessment & Plan:  I personally reviewed all images and lab data in the [facility] system as well as any outside material available during this office visit and agree with the  radiology impressions.   Hypertension Hypertension under good control continue current medication profile  Asthma, moderate persistent Moderate persistent asthma stable at this time continue Symbicort on a scheduled basis  Diabetes type 2, controlled ([place]) Type 2 diabetes well controlled A1c is 5.9 note foot exam was normal at this visit  Continue Metformin as prescribed  Major neurocognitive disorder due to multiple etiologies with behavioral disturbance [facility]) Patient to continue follow-up with neuropsychiatry  Osteoporosis Continue vitamin D and calcium supplementation and Fosamax   Alesi was seen today for follow-up.  Diagnoses and all orders for this visit:  Controlled type 2 diabetes mellitus without complication, without long-term current use of insulin ([facility]) -     POCT glycosylated  hemoglobin (Hb A1C) -     POCT glucose (manual  entry)  Schizophrenia, unspecified type ([place])  Primary hypertension  Major neurocognitive disorder due to multiple etiologies with behavioral disturbance ([facility])  Moderate persistent asthma without complication  Age-related osteoporosis without current pathological fracture  Other orders -     SYMBICORT 160-4.5 MCG/ACT inhaler; Inhale 2 puffs into the lungs in the morning and at bedtime. -     fenofibrate 160 MG tablet; Take 1 tablet (160 mg total) by mouth daily. -     ramipril (ALTACE) 2.5 MG capsule; Take 1 capsule (2.5 mg total) by mouth daily. -     metFORMIN (GLUCOPHAGE) 500 MG tablet; Take 1 tablet (500 mg total) by mouth 2 (two) times daily with a meal.

## 2020-09-01 ENCOUNTER — Other Ambulatory Visit: Payer: Self-pay

## 2020-09-01 ENCOUNTER — Ambulatory Visit: Payer: Medicare Other | Attending: Critical Care Medicine | Admitting: Critical Care Medicine

## 2020-09-01 ENCOUNTER — Encounter: Payer: Self-pay | Admitting: Critical Care Medicine

## 2020-09-01 VITALS — BP 104/68 | HR 112 | Temp 98.2°F | Ht 64.0 in | Wt 166.0 lb

## 2020-09-01 DIAGNOSIS — M810 Age-related osteoporosis without current pathological fracture: Secondary | ICD-10-CM

## 2020-09-01 DIAGNOSIS — F209 Schizophrenia, unspecified: Secondary | ICD-10-CM | POA: Diagnosis not present

## 2020-09-01 DIAGNOSIS — E119 Type 2 diabetes mellitus without complications: Secondary | ICD-10-CM

## 2020-09-01 DIAGNOSIS — J4540 Moderate persistent asthma, uncomplicated: Secondary | ICD-10-CM

## 2020-09-01 DIAGNOSIS — F02818 Dementia in other diseases classified elsewhere, unspecified severity, with other behavioral disturbance: Secondary | ICD-10-CM

## 2020-09-01 DIAGNOSIS — I10 Essential (primary) hypertension: Secondary | ICD-10-CM

## 2020-09-01 DIAGNOSIS — F0281 Dementia in other diseases classified elsewhere with behavioral disturbance: Secondary | ICD-10-CM | POA: Diagnosis not present

## 2020-09-01 LAB — POCT GLYCOSYLATED HEMOGLOBIN (HGB A1C): Hemoglobin A1C: 5.9 % — AB (ref 4.0–5.6)

## 2020-09-01 LAB — GLUCOSE, POCT (MANUAL RESULT ENTRY): POC Glucose: 119 mg/dl — AB (ref 70–99)

## 2020-09-01 MED ORDER — RAMIPRIL 2.5 MG PO CAPS: 2.5000 mg | ORAL_CAPSULE | Freq: Every day | ORAL | 5 refills | Status: DC

## 2020-09-01 MED ORDER — FENOFIBRATE 160 MG PO TABS: 160.0000 mg | ORAL_TABLET | Freq: Every day | ORAL | 6 refills | Status: DC

## 2020-09-01 MED ORDER — SYMBICORT 160-4.5 MCG/ACT IN AERO: 2.0000 | INHALATION_SPRAY | Freq: Two times a day (BID) | RESPIRATORY_TRACT | 11 refills | Status: DC

## 2020-09-01 MED ORDER — METFORMIN HCL 500 MG PO TABS: 500.0000 mg | ORAL_TABLET | Freq: Two times a day (BID) | ORAL | 6 refills | Status: DC

## 2020-09-01 NOTE — Assessment & Plan Note (Addendum)
Type 2 diabetes well controlled A1c is 5.9 note foot exam was normal at this visit  Continue Metformin as prescribed

## 2020-09-01 NOTE — Assessment & Plan Note (Signed)
Moderate persistent asthma stable at this time continue Symbicort on a scheduled basis

## 2020-09-01 NOTE — Assessment & Plan Note (Signed)
Hypertension under good control continue current medication profile

## 2020-09-01 NOTE — Assessment & Plan Note (Signed)
Patient to continue follow-up with neuropsychiatry

## 2020-09-01 NOTE — Assessment & Plan Note (Signed)
Continue vitamin D and calcium supplementation and Fosamax

## 2020-09-01 NOTE — Patient Instructions (Signed)
No change in medications  Refills on cellular medicine sent to your [place]  Return in follow-up 4 months

## 2020-11-03 ENCOUNTER — Other Ambulatory Visit: Payer: Self-pay

## 2020-11-03 ENCOUNTER — Encounter: Payer: Self-pay | Admitting: Podiatry

## 2020-11-03 ENCOUNTER — Ambulatory Visit: Payer: Medicare Other | Admitting: Podiatry

## 2020-11-03 DIAGNOSIS — L608 Other nail disorders: Secondary | ICD-10-CM

## 2020-11-03 DIAGNOSIS — L600 Ingrowing nail: Secondary | ICD-10-CM | POA: Insufficient documentation

## 2020-11-03 NOTE — Progress Notes (Signed)
This patient returns to my office for at risk foot care.  This patient requires this care by a professional since this patient will be at risk due to having diabetes.    She has pain on the outside border second toenail left.  She says she has nail fragments growing at base both big toes outer borders due to previous nail surgery.   This patient presents for at risk foot care today.  General Appearance  Alert, conversant and in no acute stress.  Vascular  Dorsalis pedis and posterior tibial  pulses are palpable  bilaterally.  Capillary return is within normal limits  bilaterally. Temperature is within normal limits  bilaterally.  Neurologic  Senn-Weinstein monofilament wire test within normal limits  bilaterally. Muscle power within normal limits bilaterally.  Nails Pinder nail second toe left foot .  Nail spicules along the lateral border both hallux.  No infection noted.  Orthopedic  No limitations of motion  feet .  No crepitus or effusions noted.  No bony pathology or digital deformities noted.  Skin  normotropic skin with no porokeratosis noted bilaterally.  No signs of infections or ulcers noted.     Pincer second toenail left foot.  Consent was obtained for treatment procedures.   Debridement of pincer nail and nail spicules with a nail nipper followed by dremel tool usage.   Return office visit      prn               Told patient to return for periodic foot care and evaluation due to potential at risk complications.   Gardiner Barefoot DPM

## 2021-01-05 ENCOUNTER — Ambulatory Visit: Payer: Medicare Other | Admitting: Critical Care Medicine

## 2021-01-07 ENCOUNTER — Ambulatory Visit: Payer: Medicare Other | Admitting: Critical Care Medicine

## 2021-02-03 ENCOUNTER — Ambulatory Visit (INDEPENDENT_AMBULATORY_CARE_PROVIDER_SITE_OTHER): Payer: Medicare Other | Admitting: Podiatry

## 2021-02-03 ENCOUNTER — Encounter: Payer: Self-pay | Admitting: Podiatry

## 2021-02-03 ENCOUNTER — Other Ambulatory Visit: Payer: Self-pay

## 2021-02-03 DIAGNOSIS — L608 Other nail disorders: Secondary | ICD-10-CM

## 2021-02-03 DIAGNOSIS — L600 Ingrowing nail: Secondary | ICD-10-CM

## 2021-02-03 DIAGNOSIS — E119 Type 2 diabetes mellitus without complications: Secondary | ICD-10-CM

## 2021-02-03 NOTE — Progress Notes (Signed)
This patient returns to my office for at risk foot care.  This patient requires this care by a professional since this patient will be at risk due to having diabetes.    She has pain on the outside border second toenail left.  She says she has long nails which have just started hurting..   This patient presents for at risk foot care today.  General Appearance  Alert, conversant and in no acute stress.  Vascular  Dorsalis pedis and posterior tibial  pulses are palpable  bilaterally.  Capillary return is within normal limits  bilaterally. Temperature is within normal limits  bilaterally.  Neurologic  Senn-Weinstein monofilament wire test within normal limits  bilaterally. Muscle power within normal limits bilaterally.  Nails Pincer nail second toe left foot .  Long thick hallux nails.  No infection noted.  Orthopedic  No limitations of motion  feet .  No crepitus or effusions noted.  No bony pathology or digital deformities noted.  Skin  normotropic skin with no porokeratosis noted bilaterally.  No signs of infections or ulcers noted.     Pincer second toenail left foot.  Consent was obtained for treatment procedures.   Debridement of pincer nail and nail spicules with a nail nipper followed by dremel tool usage.   Return office visit      3 months              Told patient to return for periodic foot care and evaluation due to potential at risk complications.   Gardiner Barefoot DPM

## 2021-02-24 ENCOUNTER — Telehealth: Payer: Self-pay

## 2021-02-24 NOTE — Telephone Encounter (Signed)
Pt is requesting refills on medications due to her wanting to est care with you.

## 2021-02-24 NOTE — Telephone Encounter (Signed)
Copied from [place] [id number]. Topic: General - Other >> Feb 22, 2021  4:42 PM Pawlus, Brayton Layman A wrote: Reason for CRM: Pt stated she left a message for Dr Joya Gaskins earlier today, I do not see any previous notes, please advise.  Spoke with Pt to see the message she left for Dr. Joya Gaskins. Patient stated is discharged herself from her last PCP and would like Dr. Joya Gaskins to take over her care. She also is asking for Refills on her Fosamax, Omeprazole, and Ramipril because she is out. Pt also stated she is out of her Fenofibrate but she was given a Qty of 60 tablets with 6 refills. Explained to Patient to reach out to her pharmacy regarding the medication. Please advise if refills can be given.

## 2021-02-24 NOTE — Telephone Encounter (Signed)
She will need to get current pcp to do this,  After I see her I will be glad to f/u

## 2021-02-25 NOTE — Telephone Encounter (Signed)
Tried contacting pt to go over provider response pt didn't answer and was unable to lvm due to vm not being setup

## 2021-03-21 NOTE — Progress Notes (Signed)
Subjective:    Patient ID: Elizabeth Schwartz, female    DOB: May 13, 1946, 75 y.o.   MRN: [id number]  04/03/19 This is a 75 year old female history of asthma and poss COPD and former smoker.    I last saw this patient in May 2020 and began the patient on the Symbicort 160 at 2 inhalations twice daily.  Note that pulmonary function studies have shown mild reduction in FEV1 and FVC ratio without change in bronchodilators.  Also the patient has not had desaturation with exertion.  The patient also has as needed albuterol.  The patient is an ex-smoker.  Patient did have issues with syncope and presyncope over the last several months.  Particularly in the high heat the patient had spells of dizziness and lightheadedness.  EMS was called at 1 point EKG was unremarkable.  The patient denies any chest pain at this time or cough or mucus production.  She does tell me now that she does have a primary care provider who follows her regularly and also she goes to [facility] for her mental health care  She was diagnosed with asthma and COPD overlap syndrome in 2013 by another pulmonary physician.  Pulmonary functions done at that time showed no significant obstruction and DLCO was preserved.  The patient's not on oxygen at this time.  Note the patient does monitor her blood sugars and they have been in the 80-1 10 range  11/30: Since the last visit the patient's asthma has been under good control.  She is using Symbicort 2 inhalations twice daily and has not required albuterol.  She has had no further exacerbations of her asthma.  Note she does follow another primary care provider who is managing her diabetes hypertension and other medical conditions.  She is coming to our clinic for pulmonary management only.    02/11/2020 the patient returns in follow-up today for her pulmonary management of asthma.  She is not having active complaints.  See asthma assessment below.  Note she does have another primary care provider  who is following her for other medical problems.  09/01/2020 The patient returns in follow-up for her asthma and I assumed at the last visit she was establishing here for primary care but she informed me today she is in the process of looking for a new primary care provider from her former 1.  Patient does have the diabetes comes in on arrival blood sugar is 119 A1c was 5.9  Patient has no new complaints at this time blood pressures well controlled on arrival is 104/68  She has no new symptom complex  5/77 This is 75 year old female who I was previously seeing for asthma alone now her former primary care provider wishes to transfer her care to me and the patient is in agreement.  Patient also has type 2 diabetes, osteoporosis, unspecified schizophrenia, vitamin D deficiency, hypertension.  On arrival blood pressure was 148/71  Patient complains of some diarrhea that is occurred over the past week.  There is no blood in it she denies any abdominal pain.  She has no other complaints this time she does need multiple medications refilled.  She needs to have her A1c checked.  Note she just had an eye exam completed.  See shortness of breath assessment below  Shortness of Breath This is a chronic problem. The current episode started more than 1 year ago. The problem occurs every several days (exertional only   ). The problem has been unchanged. Pertinent negatives include no  abdominal pain, chest pain, claudication, coryza, ear pain, fever, headaches, hemoptysis, leg pain, leg swelling, neck pain, orthopnea, PND, rash, rhinorrhea, sore throat, sputum production, swollen glands, syncope, vomiting or wheezing. The symptoms are aggravated by any activity, exercise and pollens. Associated symptoms comments: Dry cough, no wheeze. Risk factors include smoking. She has tried beta agonist inhalers and steroid inhalers for the symptoms. The treatment provided significant relief. Her past medical history is  significant for asthma, COPD and pneumonia. There is no history of allergies, CAD, DVT, a heart failure or PE.   Past Medical History:  Diagnosis Date  . Anemia   . COPD (chronic obstructive pulmonary disease) ([place])   . Depression   . Diabetes mellitus   . GERD (gastroesophageal reflux disease)   . Hyperlipidemia   . Hypertension   . Osteoporosis   . Schizophrenia, schizo-affective ([place])   . SOB (shortness of breath) on exertion   . Syncope 06/11/2019     History reviewed. No pertinent family history.   Social History   Socioeconomic History  . Marital status: Single    Spouse name: Not on file  . Number of children: Not on file  . Years of education: Not on file  . Highest education level: Not on file  Occupational History  . Not on file  Tobacco Use  . Smoking status: Former Smoker    Years: 15.00    Types: Cigarettes    Quit date: 02/28/2012    Years since quitting: 9.0  . Smokeless tobacco: Former Systems developer    Types: Secondary school teacher  . Vaping Use: Never used  Substance and Sexual Activity  . Alcohol use: Yes    Comment: "socially"  . Drug use: No  . Sexual activity: Not Currently  Other Topics Concern  . Not on file  Social History Narrative  . Not on file   Social Determinants of Health   Financial Resource Strain: Not on file  Food Insecurity: Not on file  Transportation Needs: Not on file  Physical Activity: Not on file  Stress: Not on file  Social Connections: Not on file  Intimate Partner Violence: Not on file     Allergies  Allergen Reactions  . Crestor [Rosuvastatin]     Muscle aches.      Outpatient Medications Prior to Visit  Medication Sig Dispense Refill  . acetaminophen (TYLENOL) 500 MG tablet Take 500 mg by mouth 2 (two) times daily.    . calcium carbonate (OS-CAL) 600 MG TABS Take 600 mg by mouth 2 (two) times daily with a meal.    . clozapine (CLOZARIL) 200 MG tablet Take 1 tablet by mouth at bedtime.     . fish oil-omega-3 fatty  acids 1000 MG capsule Take 1 g by mouth every morning.    . vitamin C (ASCORBIC ACID) 500 MG tablet Take 500 mg by mouth every morning.    Marland Kitchen VITAMIN D, CHOLECALCIFEROL, PO Take 600 Units by mouth daily.    Marland Kitchen albuterol (VENTOLIN HFA) 108 (90 Base) MCG/ACT inhaler INHALE TWO PUFFS BY MOUTH INTO THE LUNGS EVERY 4 HOURS AS NEEDED FOR WHEEZING OR SHORTNESS OF BREATH 18 g 2  . alendronate (FOSAMAX) 70 MG tablet Take 1 tablet by mouth once a week.    . diclofenac Sodium (VOLTAREN) 1 % GEL     . donepezil (ARICEPT) 10 MG tablet Take 10 mg by mouth at bedtime.    . famotidine (PEPCID) 20 MG tablet Take 1 tablet by mouth  2 (two) times daily.    . fenofibrate 160 MG tablet Take 1 tablet (160 mg total) by mouth daily. 60 tablet 6  . metFORMIN (GLUCOPHAGE) 500 MG tablet Take 1 tablet (500 mg total) by mouth 2 (two) times daily with a meal. 60 tablet 6  . OneTouch Delica Lancets 95K MISC     . ONETOUCH VERIO test strip     . ramipril (ALTACE) 2.5 MG capsule Take 1 capsule (2.5 mg total) by mouth daily. 30 capsule 5  . SYMBICORT 160-4.5 MCG/ACT inhaler Inhale 2 puffs into the lungs in the morning and at bedtime. 10.2 g 11  . omeprazole (PRILOSEC) 20 MG capsule 20 mg daily.  (Patient not taking: Reported on 03/22/2021)    . permethrin (ELIMITE) 5 % cream APPLY TO SCALP AND WASH OFF AFTER 12 HOURS. MAY REPEAT IN 7 DAYS AS NEEDED (Patient not taking: Reported on 03/22/2021)     No facility-administered medications prior to visit.      Review of Systems  Constitutional: Negative for fever.  HENT: Negative for ear pain, rhinorrhea and sore throat.   Respiratory: Negative for hemoptysis, sputum production, shortness of breath and wheezing.   Cardiovascular: Negative for chest pain, orthopnea, claudication, leg swelling, syncope and PND.  Gastrointestinal: Negative for abdominal pain and vomiting.  Musculoskeletal: Negative for neck pain.  Skin: Negative for rash.  Neurological: Negative for headaches.        Objective:   Physical Exam Vitals:   03/22/21 0918 03/22/21 1025  BP: (!) 146/82 (!) 148/71  Pulse: 72   Resp: 18   SpO2: 98%   Weight: 169 lb (76.7 kg)   Height: 5\' 3"  (1.6 m)     Gen: Pleasant, well-nourished, in no distress,  normal affect  ENT: No lesions,  mouth clear,  oropharynx clear, no postnasal drip  Neck: No JVD, no TMG, no carotid bruits  Lungs: No use of accessory muscles, no dullness to percussion, improved breath sounds without wheezes   cardiovascular: RRR, heart sounds normal, no murmur or gallops, no peripheral edema  Abdomen: soft and NT, no HSM,  BS normal  Musculoskeletal: No deformities, no cyanosis or clubbing  Neuro: alert, non focal  Skin: Warm, no lesions or rashes    - PFTs 10/11/2011 FEV1  1.76 (86%) ratio 77 and no better with B2,  DLC0 66 corrects 111   5/11 CXR: IMPRESSION: No active cardiopulmonary disease.  Improved aeration from priors.     Assessment & Plan:  I personally reviewed all images and lab data in the [facility] system as well as any outside material available during this office visit and agree with the  radiology impressions.   Hypertension We will check metabolic profile.  Patient currently on  low-dose ramipril.  Patient may need a dose increase of ramipril  Asthma, moderate persistent Moderate persistent asthma stable this time continue Symbicort  Diverticulosis History of diverticulosis now with intermittent diarrhea  Plan to check stool for pathogens and prescribe as needed Imodium and discontinue omeprazole continue famotidine  Diabetes type 2, controlled (HCC) Check hemoglobin D3O metabolic panel  Major neurocognitive disorder due to multiple etiologies with behavioral disturbance [facility]) Patient followed by neuropsychiatry continue Aricept and will also continue clozapine  Osteoporosis Significant osteoporosis will continue the Fosamax weekly  Schizophrenia ([place]) As per psychiatry  Mixed  hyperlipidemia Check lipid panel  Diarrhea of presumed infectious origin History of chronic recurrent diarrhea check for stools for pathogens   Jaydin was seen today for  diabetes.  Diagnoses and all orders for this visit:  Primary hypertension -     Comprehensive metabolic panel -     CBC with Differential/Platelet  Diarrhea of presumed infectious origin -     GI pathogen panel by PCR, stool -     CBC with Differential/Platelet -     Campylobacter culture, stool -     GI Profile, Stool, PCR -     Ova and parasite examination  Controlled type 2 diabetes mellitus without complication, without long-term current use of insulin (HCC) -     Comprehensive metabolic panel -     Hemoglobin A1c -     Lipid panel  Mixed hyperlipidemia -     Lipid panel  Moderate persistent asthma without complication  Diverticulosis  Major neurocognitive disorder due to multiple etiologies with behavioral disturbance (HCC)  Age-related osteoporosis without current pathological fracture  Schizophrenia, unspecified type (HCC)  Other orders -     albuterol (VENTOLIN HFA) 108 (90 Base) MCG/ACT inhaler; Take two puff every 6 hours as needed for shortness of breath -     alendronate (FOSAMAX) 70 MG tablet; Take 1 tablet (70 mg total) by mouth once a week. -     diclofenac Sodium (VOLTAREN) 1 % GEL; Apply twice a day to affected area -     donepezil (ARICEPT) 10 MG tablet; Take 1 tablet (10 mg total) by mouth at bedtime. -     famotidine (PEPCID) 20 MG tablet; Take 1 tablet (20 mg total) by mouth 2 (two) times daily. -     fenofibrate 160 MG tablet; Take 1 tablet (160 mg total) by mouth daily. -     metFORMIN (GLUCOPHAGE) 500 MG tablet; Take 1 tablet (500 mg total) by mouth 2 (two) times daily with a meal. -     OneTouch Delica Lancets 39J MISC; Use to test blood sugar -     ONETOUCH VERIO test strip; Use to test blood sugar -     ramipril (ALTACE) 2.5 MG capsule; Take 1 capsule (2.5 mg total) by mouth  daily. -     SYMBICORT 160-4.5 MCG/ACT inhaler; Inhale 2 puffs into the lungs in the morning and at bedtime.   I spent 33 minutes on this patient including interviewing the patient reviewing her records formulating a moderate to complex medical decision making plan

## 2021-03-22 ENCOUNTER — Telehealth: Payer: Self-pay | Admitting: Critical Care Medicine

## 2021-03-22 ENCOUNTER — Other Ambulatory Visit: Payer: Self-pay

## 2021-03-22 ENCOUNTER — Encounter: Payer: Self-pay | Admitting: Critical Care Medicine

## 2021-03-22 ENCOUNTER — Ambulatory Visit: Payer: Medicare Other | Attending: Critical Care Medicine | Admitting: Critical Care Medicine

## 2021-03-22 VITALS — BP 148/71 | HR 72 | Resp 18 | Ht 63.0 in | Wt 169.0 lb

## 2021-03-22 DIAGNOSIS — I10 Essential (primary) hypertension: Secondary | ICD-10-CM | POA: Diagnosis not present

## 2021-03-22 DIAGNOSIS — E782 Mixed hyperlipidemia: Secondary | ICD-10-CM

## 2021-03-22 DIAGNOSIS — E119 Type 2 diabetes mellitus without complications: Secondary | ICD-10-CM | POA: Diagnosis not present

## 2021-03-22 DIAGNOSIS — F209 Schizophrenia, unspecified: Secondary | ICD-10-CM

## 2021-03-22 DIAGNOSIS — F02818 Dementia in other diseases classified elsewhere, unspecified severity, with other behavioral disturbance: Secondary | ICD-10-CM

## 2021-03-22 DIAGNOSIS — J4540 Moderate persistent asthma, uncomplicated: Secondary | ICD-10-CM

## 2021-03-22 DIAGNOSIS — F0281 Dementia in other diseases classified elsewhere with behavioral disturbance: Secondary | ICD-10-CM

## 2021-03-22 DIAGNOSIS — M810 Age-related osteoporosis without current pathological fracture: Secondary | ICD-10-CM

## 2021-03-22 DIAGNOSIS — K5790 Diverticulosis of intestine, part unspecified, without perforation or abscess without bleeding: Secondary | ICD-10-CM

## 2021-03-22 DIAGNOSIS — R197 Diarrhea, unspecified: Secondary | ICD-10-CM

## 2021-03-22 MED ORDER — DICLOFENAC SODIUM 1 % EX GEL
CUTANEOUS | 3 refills | Status: DC
Start: 2021-03-22 — End: 2022-11-17

## 2021-03-22 MED ORDER — FENOFIBRATE 160 MG PO TABS: 160.0000 mg | ORAL_TABLET | Freq: Every day | ORAL | 6 refills | Status: DC

## 2021-03-22 MED ORDER — SYMBICORT 160-4.5 MCG/ACT IN AERO: 2.0000 | INHALATION_SPRAY | Freq: Two times a day (BID) | RESPIRATORY_TRACT | 11 refills | Status: DC

## 2021-03-22 MED ORDER — ALBUTEROL SULFATE HFA 108 (90 BASE) MCG/ACT IN AERS
INHALATION_SPRAY | RESPIRATORY_TRACT | 2 refills | Status: DC
Start: 2021-03-22 — End: 2021-09-06

## 2021-03-22 MED ORDER — DONEPEZIL HCL 10 MG PO TABS
10.0000 mg | ORAL_TABLET | Freq: Every day | ORAL | 1 refills | Status: DC
Start: 2021-03-22 — End: 2021-05-24

## 2021-03-22 MED ORDER — METFORMIN HCL 500 MG PO TABS: 500.0000 mg | ORAL_TABLET | Freq: Two times a day (BID) | ORAL | 6 refills | Status: DC

## 2021-03-22 MED ORDER — FAMOTIDINE 20 MG PO TABS
20.0000 mg | ORAL_TABLET | Freq: Two times a day (BID) | ORAL | 2 refills | Status: DC
Start: 2021-03-22 — End: 2021-06-04

## 2021-03-22 MED ORDER — ONETOUCH VERIO VI STRP: ORAL_STRIP | 2 refills | Status: DC

## 2021-03-22 MED ORDER — ALENDRONATE SODIUM 70 MG PO TABS: 70.0000 mg | ORAL_TABLET | ORAL | 3 refills | Status: DC

## 2021-03-22 MED ORDER — ONETOUCH DELICA LANCETS 33G MISC: 1 refills | Status: DC

## 2021-03-22 MED ORDER — RAMIPRIL 2.5 MG PO CAPS: 2.5000 mg | ORAL_CAPSULE | Freq: Every day | ORAL | 5 refills | Status: DC

## 2021-03-22 NOTE — Assessment & Plan Note (Addendum)
History of diverticulosis now with intermittent diarrhea  Plan to check stool for pathogens and prescribe as needed Imodium and discontinue omeprazole continue famotidine

## 2021-03-22 NOTE — Assessment & Plan Note (Signed)
As per psychiatry

## 2021-03-22 NOTE — Telephone Encounter (Signed)
Pt was called and she states that she feel her BP is ok.

## 2021-03-22 NOTE — Patient Instructions (Signed)
Complete set of labs obtained today including stool studies  Refills on all your medications sent to your [place]  Stop omeprazole and continue famotidine  You may try Imodium over-the-counter as needed for loose stools we will see what the stool studies show and contact you  We have switched you to Dr. Joya Gaskins for primary care  Return to see Dr. Joya Gaskins in 2 months

## 2021-03-22 NOTE — Assessment & Plan Note (Signed)
We will check metabolic profile.  Patient currently on  low-dose ramipril.  Patient may need a dose increase of ramipril

## 2021-03-22 NOTE — Assessment & Plan Note (Signed)
History of chronic recurrent diarrhea check for stools for pathogens

## 2021-03-22 NOTE — Assessment & Plan Note (Signed)
Check hemoglobin W9N metabolic panel

## 2021-03-22 NOTE — Assessment & Plan Note (Signed)
Check lipid panel  

## 2021-03-22 NOTE — Assessment & Plan Note (Signed)
Patient followed by neuropsychiatry continue Aricept and will also continue clozapine

## 2021-03-22 NOTE — Telephone Encounter (Signed)
Copied from [place] [id number]. Topic: General - Inquiry >> Mar 22, 2021 12:52 PM Greggory Keen D wrote: Reason for CRM: Pt called saying she took some otc cold medication that warns against using with htn.  CB#  [phone]

## 2021-03-22 NOTE — Assessment & Plan Note (Signed)
Moderate persistent asthma stable this time continue Symbicort

## 2021-03-22 NOTE — Assessment & Plan Note (Signed)
Significant osteoporosis will continue the Fosamax weekly

## 2021-03-23 ENCOUNTER — Telehealth: Payer: Self-pay

## 2021-03-23 LAB — CBC WITH DIFFERENTIAL/PLATELET
Basophils Absolute: 0 10*3/uL (ref 0.0–0.2)
Basos: 1 %
EOS (ABSOLUTE): 0.1 10*3/uL (ref 0.0–0.4)
Eos: 2 %
Hematocrit: 29.8 % — ABNORMAL LOW (ref 34.0–46.6)
Hemoglobin: 9.9 g/dL — ABNORMAL LOW (ref 11.1–15.9)
Immature Grans (Abs): 0.1 10*3/uL (ref 0.0–0.1)
Immature Granulocytes: 1 %
Lymphocytes Absolute: 1.5 10*3/uL (ref 0.7–3.1)
Lymphs: 23 %
MCH: 30.1 pg (ref 26.6–33.0)
MCHC: 33.2 g/dL (ref 31.5–35.7)
MCV: 91 fL (ref 79–97)
Monocytes Absolute: 0.5 10*3/uL (ref 0.1–0.9)
Monocytes: 7 %
Neutrophils Absolute: 4.5 10*3/uL (ref 1.4–7.0)
Neutrophils: 66 %
Platelets: 292 10*3/uL (ref 150–450)
RBC: 3.29 x10E6/uL — ABNORMAL LOW (ref 3.77–5.28)
RDW: 13.3 % (ref 11.7–15.4)
WBC: 6.7 10*3/uL (ref 3.4–10.8)

## 2021-03-23 LAB — LIPID PANEL
Chol/HDL Ratio: 4.1 ratio (ref 0.0–4.4)
Cholesterol, Total: 211 mg/dL — ABNORMAL HIGH (ref 100–199)
HDL: 52 mg/dL (ref >39)
LDL Chol Calc (NIH): 116 mg/dL — ABNORMAL HIGH (ref 0–99)
Triglycerides: 250 mg/dL — ABNORMAL HIGH (ref 0–149)
VLDL Cholesterol Cal: 43 mg/dL — ABNORMAL HIGH (ref 5–40)

## 2021-03-23 LAB — COMPREHENSIVE METABOLIC PANEL
ALT: 31 IU/L (ref 0–32)
AST: 25 IU/L (ref 0–40)
Albumin/Globulin Ratio: 2.5 — ABNORMAL HIGH (ref 1.2–2.2)
Albumin: 4.7 g/dL (ref 3.7–4.7)
Alkaline Phosphatase: 42 IU/L — ABNORMAL LOW (ref 44–121)
BUN/Creatinine Ratio: 18 (ref 12–28)
BUN: 22 mg/dL (ref 8–27)
Bilirubin Total: <0.2 mg/dL (ref 0.0–1.2)
CO2: 21 mmol/L (ref 20–29)
Calcium: 9.3 mg/dL (ref 8.7–10.3)
Chloride: 102 mmol/L (ref 96–106)
Creatinine, Ser: 1.21 mg/dL — ABNORMAL HIGH (ref 0.57–1.00)
Globulin, Total: 1.9 g/dL (ref 1.5–4.5)
Glucose: 112 mg/dL — ABNORMAL HIGH (ref 65–99)
Potassium: 4.5 mmol/L (ref 3.5–5.2)
Sodium: 142 mmol/L (ref 134–144)
Total Protein: 6.6 g/dL (ref 6.0–8.5)
eGFR: 47 mL/min/{1.73_m2} — ABNORMAL LOW (ref >59)

## 2021-03-23 LAB — HEMOGLOBIN A1C
Est. average glucose Bld gHb Est-mCnc: 131 mg/dL
Hgb A1c MFr Bld: 6.2 % — ABNORMAL HIGH (ref 4.8–5.6)

## 2021-03-23 NOTE — Telephone Encounter (Signed)
Attempted to call pt regarding results no answer lvm to return call.

## 2021-03-23 NOTE — Telephone Encounter (Signed)
-----   Message from Patrick E Wright, MD sent at 03/23/2021 11:59 AM EDT ----- Let pt know kidney liver function stable,  diabetes is good  A1C good at 6.2  blood counts stable, stay on omega 3 fish oil for her cholesterol which remains high 

## 2021-03-24 LAB — GI PROFILE, STOOL, PCR

## 2021-03-24 NOTE — Telephone Encounter (Signed)
-----   Message from Patrick E Wright, MD sent at 03/23/2021 11:59 AM EDT ----- Let pt know kidney liver function stable,  diabetes is good  A1C good at 6.2  blood counts stable, stay on omega 3 fish oil for her cholesterol which remains high 

## 2021-03-24 NOTE — Telephone Encounter (Signed)
-----   Message from [name], MD sent at 03/23/2021 11:59 AM EDT ----- Let pt know kidney liver function stable,  diabetes is good  A1C good at 6.2  blood counts stable, stay on omega 3 fish oil for her cholesterol which remains high

## 2021-03-24 NOTE — Telephone Encounter (Signed)
Attempted to call pt again no answer lvm to return call.

## 2021-03-24 NOTE — Telephone Encounter (Signed)
Attempted to call pt multiple times no answer lab results sent via mail to address on file.

## 2021-03-25 ENCOUNTER — Telehealth: Payer: Self-pay

## 2021-03-25 LAB — OVA AND PARASITE EXAMINATION

## 2021-03-25 NOTE — Telephone Encounter (Signed)
Pt informed of stool studies normal verbalized understanding and voiced no other concerns.

## 2021-03-25 NOTE — Telephone Encounter (Signed)
-----   Message from Patrick E Wright, MD sent at 03/24/2021  7:43 PM EDT ----- Let pt know stool studes were normal   

## 2021-03-25 NOTE — Telephone Encounter (Signed)
Attempted to call pt regarding stool results no answer lvm to return call.

## 2021-03-25 NOTE — Telephone Encounter (Signed)
-----   Message from [name], MD sent at 03/24/2021  7:43 PM EDT ----- Let pt know stool studes were normal

## 2021-04-15 ENCOUNTER — Ambulatory Visit: Payer: Medicare Other | Admitting: Critical Care Medicine

## 2021-04-20 DIAGNOSIS — F209 Schizophrenia, unspecified: Secondary | ICD-10-CM | POA: Diagnosis not present

## 2021-04-20 DIAGNOSIS — Z79899 Other long term (current) drug therapy: Secondary | ICD-10-CM | POA: Diagnosis not present

## 2021-05-07 ENCOUNTER — Encounter: Payer: Self-pay | Admitting: Podiatry

## 2021-05-07 ENCOUNTER — Other Ambulatory Visit: Payer: Self-pay

## 2021-05-07 ENCOUNTER — Ambulatory Visit: Payer: Medicare Other | Admitting: Podiatry

## 2021-05-07 DIAGNOSIS — Z79899 Other long term (current) drug therapy: Secondary | ICD-10-CM | POA: Diagnosis not present

## 2021-05-07 DIAGNOSIS — L608 Other nail disorders: Secondary | ICD-10-CM | POA: Diagnosis not present

## 2021-05-07 DIAGNOSIS — E119 Type 2 diabetes mellitus without complications: Secondary | ICD-10-CM | POA: Diagnosis not present

## 2021-05-07 DIAGNOSIS — F209 Schizophrenia, unspecified: Secondary | ICD-10-CM | POA: Diagnosis not present

## 2021-05-07 NOTE — Progress Notes (Signed)
This patient returns to my office for at risk foot care.  This patient requires this care by a professional since this patient will be at risk due to having diabetes.    She has pain on the outside border second toenail left.  She says she has long nails which have just started hurting..   This patient presents for at risk foot care today.  General Appearance  Alert, conversant and in no acute stress.  Vascular  Dorsalis pedis and posterior tibial  pulses are palpable  bilaterally.  Capillary return is within normal limits  bilaterally. Temperature is within normal limits  bilaterally.  Neurologic  Senn-Weinstein monofilament wire test within normal limits  bilaterally. Muscle power within normal limits bilaterally.  Nails Pincer nail second toe left foot .  Long thick hallux nails.  No infection noted.  Orthopedic  No limitations of motion  feet .  No crepitus or effusions noted.  No bony pathology or digital deformities noted.  Skin  normotropic skin with no porokeratosis noted bilaterally.  No signs of infections or ulcers noted.     Pincer second toenail left foot.  Consent was obtained for treatment procedures.   Debridement of pincer nail and nail spicules with a nail nipper followed by dremel tool usage.   Return office visit      3 months              Told patient to return for periodic foot care and evaluation due to potential at risk complications.   Gardiner Barefoot DPM

## 2021-05-23 NOTE — Progress Notes (Signed)
Subjective:    Patient ID: Elizabeth Schwartz, female    DOB: Jul 11, 1946, 75 y.o.   MRN: [id number]  04/03/19 This is a 75 year old female history of asthma and poss COPD and former smoker.    I last saw this patient in May 2020 and began the patient on the Symbicort 160 at 2 inhalations twice daily.  Note that pulmonary function studies have shown mild reduction in FEV1 and FVC ratio without change in bronchodilators.  Also the patient has not had desaturation with exertion.  The patient also has as needed albuterol.  The patient is an ex-smoker.  Patient did have issues with syncope and presyncope over the last several months.  Particularly in the high heat the patient had spells of dizziness and lightheadedness.  EMS was called at 1 point EKG was unremarkable.  The patient denies any chest pain at this time or cough or mucus production.  She does tell me now that she does have a primary care provider who follows her regularly and also she goes to [facility] for her mental health care  She was diagnosed with asthma and COPD overlap syndrome in 2013 by another pulmonary physician.  Pulmonary functions done at that time showed no significant obstruction and DLCO was preserved.  The patient's not on oxygen at this time.  Note the patient does monitor her blood sugars and they have been in the 80-1 10 range  11/30: Since the last visit the patient's asthma has been under good control.  She is using Symbicort 2 inhalations twice daily and has not required albuterol.  She has had no further exacerbations of her asthma.  Note she does follow another primary care provider who is managing her diabetes hypertension and other medical conditions.  She is coming to our clinic for pulmonary management only.    02/11/2020 the patient returns in follow-up today for her pulmonary management of asthma.  She is not having active complaints.  See asthma assessment below.  Note she does have another primary care provider  who is following her for other medical problems.  09/01/2020 The patient returns in follow-up for her asthma and I assumed at the last visit she was establishing here for primary care but she informed me today she is in the process of looking for a new primary care provider from her former 1.  Patient does have the diabetes comes in on arrival blood sugar is 119 A1c was 5.9  Patient has no new complaints at this time blood pressures well controlled on arrival is 104/68  She has no new symptom complex  5/7 This is 75 year old female who I was previously seeing for asthma alone now her former primary care provider wishes to transfer her care to me and the patient is in agreement.  Patient also has type 2 diabetes, osteoporosis, unspecified schizophrenia, vitamin D deficiency, hypertension.  On arrival blood pressure was 148/71  Patient complains of some diarrhea that is occurred over the past week.  There is no blood in it she denies any abdominal pain.  She has no other complaints this time she does need multiple medications refilled.  She needs to have her A1c checked.  Note she just had an eye exam completed.  See shortness of breath assessment below  7/11 The patient seen in return follow-up and overall doing well.  Only complains of left shoulder pain which is a chronic recurring issue.  She has seen Dr. Onnie Graham for this and orthopedics in the past.  Patient's been compliant with her rammer Pearl and blood pressure on arrival 124/74.  She is having no difficulty with her asthma on the Symbicort.  Patient is neurocognitive situation is stable and continues to follow with neuropsychiatry.  Patient maintains Fosamax weekly for osteoporosis.   Shortness of Breath This is a chronic problem. The current episode started more than 1 year ago. The problem occurs daily (exertional only   ). The problem has been unchanged. Pertinent negatives include no abdominal pain, chest pain, claudication,  coryza, ear pain, fever, headaches, hemoptysis, leg pain, leg swelling, neck pain, orthopnea, PND, rash, rhinorrhea, sore throat, sputum production, swollen glands, syncope, vomiting or wheezing. The symptoms are aggravated by any activity, exercise and pollens. Associated symptoms comments: Dry cough, no wheeze. Risk factors include smoking. She has tried beta agonist inhalers and steroid inhalers for the symptoms. The treatment provided significant relief. Her past medical history is significant for asthma, COPD and pneumonia. There is no history of allergies, CAD, DVT, a heart failure or PE.  Past Medical History:  Diagnosis Date   Anemia    COPD (chronic obstructive pulmonary disease) (HCC)    Depression    Diabetes mellitus    GERD (gastroesophageal reflux disease)    Hyperlipidemia    Hypertension    Osteoporosis    Schizophrenia, schizo-affective (HCC)    SOB (shortness of breath) on exertion    Syncope 06/11/2019     History reviewed. No pertinent family history.   Social History   Socioeconomic History   Marital status: Single    Spouse name: Not on file   Number of children: Not on file   Years of education: Not on file   Highest education level: Not on file  Occupational History   Not on file  Tobacco Use   Smoking status: Former    Years: 15.00    Pack years: 0.00    Types: Cigarettes    Quit date: 02/28/2012    Years since quitting: 9.2   Smokeless tobacco: Former    Types: Nurse, children's Use: Never used  Substance and Sexual Activity   Alcohol use: Yes    Comment: "socially"   Drug use: No   Sexual activity: Not Currently  Other Topics Concern   Not on file  Social History Narrative   Not on file   Social Determinants of Health   Financial Resource Strain: Not on file  Food Insecurity: Not on file  Transportation Needs: Not on file  Physical Activity: Not on file  Stress: Not on file  Social Connections: Not on file  Intimate Partner  Violence: Not on file     Allergies  Allergen Reactions   Crestor [Rosuvastatin]     Muscle aches.      Outpatient Medications Prior to Visit  Medication Sig Dispense Refill   acetaminophen (TYLENOL) 500 MG tablet Take 500 mg by mouth 2 (two) times daily.     albuterol (VENTOLIN HFA) 108 (90 Base) MCG/ACT inhaler Take two puff every 6 hours as needed for shortness of breath 18 g 2   alendronate (FOSAMAX) 70 MG tablet Take 1 tablet (70 mg total) by mouth once a week. 12 tablet 3   calcium carbonate (OS-CAL) 600 MG TABS Take 600 mg by mouth 2 (two) times daily with a meal.     clozapine (CLOZARIL) 200 MG tablet Take 1 tablet by mouth at bedtime.      diclofenac Sodium (VOLTAREN) 1 %  GEL Apply twice a day to affected area 50 g 3   famotidine (PEPCID) 20 MG tablet Take 1 tablet (20 mg total) by mouth 2 (two) times daily. 60 tablet 2   fenofibrate 160 MG tablet Take 1 tablet (160 mg total) by mouth daily. 60 tablet 6   fish oil-omega-3 fatty acids 1000 MG capsule Take 1 g by mouth every morning.     metFORMIN (GLUCOPHAGE) 500 MG tablet Take 1 tablet (500 mg total) by mouth 2 (two) times daily with a meal. 60 tablet 6   OneTouch Delica Lancets 75T MISC Use to test blood sugar 100 each 1   ONETOUCH VERIO test strip Use to test blood sugar 100 each 2   ramipril (ALTACE) 2.5 MG capsule Take 1 capsule (2.5 mg total) by mouth daily. 30 capsule 5   SYMBICORT 160-4.5 MCG/ACT inhaler Inhale 2 puffs into the lungs in the morning and at bedtime. 10.2 g 11   vitamin C (ASCORBIC ACID) 500 MG tablet Take 500 mg by mouth every morning.     VITAMIN D, CHOLECALCIFEROL, PO Take 600 Units by mouth daily.     donepezil (ARICEPT) 10 MG tablet Take 1 tablet (10 mg total) by mouth at bedtime. 60 tablet 1   No facility-administered medications prior to visit.      Review of Systems  Constitutional:  Negative for fever.  HENT:  Negative for ear pain, rhinorrhea and sore throat.   Respiratory:  Negative for  hemoptysis, sputum production, shortness of breath and wheezing.   Cardiovascular:  Negative for chest pain, orthopnea, claudication, leg swelling, syncope and PND.  Gastrointestinal:  Negative for abdominal pain and vomiting.  Musculoskeletal:  Negative for neck pain.       Left shoulder pain  Skin:  Negative for rash.  Neurological:  Negative for headaches.      Objective:   Physical Exam Vitals:   05/24/21 1004  BP: 124/74  Pulse: (!) 109  SpO2: 97%  Weight: 165 lb (74.8 kg)  Height: 5\' 3"  (1.6 m)    Gen: Pleasant, well-nourished, in no distress,  normal affect  ENT: No lesions,  mouth clear,  oropharynx clear, no postnasal drip  Neck: No JVD, no TMG, no carotid bruits  Lungs: No use of accessory muscles, no dullness to percussion, improved breath sounds without wheezes   cardiovascular: RRR, heart sounds normal, no murmur or gallops, no peripheral edema  Abdomen: soft and NT, no HSM,  BS normal  Musculoskeletal: No deformities, no cyanosis or clubbing  Neuro: alert, non focal  Skin: Warm, no lesions or rashes    - PFTs 10/11/2011 FEV1  1.76 (86%) ratio 77 and no better with B2,  DLC0 66 corrects 111   5/11 CXR: IMPRESSION: No active cardiopulmonary disease.   Improved aeration from priors.      Assessment & Plan:  I personally reviewed all images and lab data in the [facility] system as well as any outside material available during this office visit and agree with the  radiology impressions.   Hypertension Hypertension stable at this time continue Amaryl  Asthma, moderate persistent Asthma stable at this time continue Symbicort albuterol  Diabetes type 2, controlled (HCC) A1c stable continue metformin  Chronic left shoulder pain Chronic left shoulder pain will refer to orthopedics  Schizophrenia [facility]) As per psychiatry  [place] was seen today for hypertension.  Diagnoses and all orders for this visit:  Primary hypertension  Chronic left shoulder  pain -  Ambulatory referral to Orthopedic Surgery  Moderate persistent asthma without complication  Controlled type 2 diabetes mellitus without complication, without long-term current use of insulin (HCC)  Schizophrenia, unspecified type ([place])  Other orders -     donepezil (ARICEPT) 10 MG tablet; Take 1 tablet (10 mg total) by mouth at bedtime.

## 2021-05-24 ENCOUNTER — Other Ambulatory Visit: Payer: Self-pay

## 2021-05-24 ENCOUNTER — Encounter: Payer: Self-pay | Admitting: Critical Care Medicine

## 2021-05-24 ENCOUNTER — Ambulatory Visit: Payer: Medicare Other | Attending: Critical Care Medicine | Admitting: Critical Care Medicine

## 2021-05-24 VITALS — BP 124/74 | HR 109 | Ht 63.0 in | Wt 165.0 lb

## 2021-05-24 DIAGNOSIS — M25512 Pain in left shoulder: Secondary | ICD-10-CM

## 2021-05-24 DIAGNOSIS — F209 Schizophrenia, unspecified: Secondary | ICD-10-CM

## 2021-05-24 DIAGNOSIS — G8929 Other chronic pain: Secondary | ICD-10-CM | POA: Insufficient documentation

## 2021-05-24 DIAGNOSIS — I10 Essential (primary) hypertension: Secondary | ICD-10-CM

## 2021-05-24 DIAGNOSIS — E119 Type 2 diabetes mellitus without complications: Secondary | ICD-10-CM | POA: Diagnosis not present

## 2021-05-24 DIAGNOSIS — J4540 Moderate persistent asthma, uncomplicated: Secondary | ICD-10-CM

## 2021-05-24 MED ORDER — DONEPEZIL HCL 10 MG PO TABS: 10.0000 mg | ORAL_TABLET | Freq: Every day | ORAL | 1 refills | Status: DC

## 2021-05-24 NOTE — Assessment & Plan Note (Signed)
Asthma stable at this time continue Symbicort albuterol

## 2021-05-24 NOTE — Assessment & Plan Note (Signed)
As per psychiatry

## 2021-05-24 NOTE — Assessment & Plan Note (Signed)
A1c stable continue metformin

## 2021-05-24 NOTE — Assessment & Plan Note (Signed)
Hypertension stable at this time continue Amaryl

## 2021-05-24 NOTE — Patient Instructions (Signed)
No change in medications  Referral back to Dr. Onnie Graham for your left shoulder will be made  You can use the Voltaren gel on the left shoulder to see Dr. Onnie Graham  Return to see Dr. Joya Gaskins 3 months

## 2021-05-24 NOTE — Assessment & Plan Note (Signed)
Chronic left shoulder pain will refer to orthopedics

## 2021-05-25 DIAGNOSIS — E119 Type 2 diabetes mellitus without complications: Secondary | ICD-10-CM | POA: Diagnosis not present

## 2021-05-25 DIAGNOSIS — H40013 Open angle with borderline findings, low risk, bilateral: Secondary | ICD-10-CM | POA: Diagnosis not present

## 2021-05-26 ENCOUNTER — Telehealth: Payer: Self-pay | Admitting: Critical Care Medicine

## 2021-05-26 NOTE — Telephone Encounter (Signed)
Pt dropped off Power of [employer] documents for Provider. Docs were labeled and sent to [employer].

## 2021-06-03 DIAGNOSIS — Z79899 Other long term (current) drug therapy: Secondary | ICD-10-CM | POA: Diagnosis not present

## 2021-06-04 ENCOUNTER — Other Ambulatory Visit: Payer: Self-pay | Admitting: Critical Care Medicine

## 2021-06-15 DIAGNOSIS — Z79899 Other long term (current) drug therapy: Secondary | ICD-10-CM | POA: Diagnosis not present

## 2021-06-21 ENCOUNTER — Other Ambulatory Visit: Payer: Self-pay | Admitting: Critical Care Medicine

## 2021-06-21 DIAGNOSIS — Z1231 Encounter for screening mammogram for malignant neoplasm of breast: Secondary | ICD-10-CM

## 2021-06-23 DIAGNOSIS — M25512 Pain in left shoulder: Secondary | ICD-10-CM | POA: Diagnosis not present

## 2021-06-23 DIAGNOSIS — M19012 Primary osteoarthritis, left shoulder: Secondary | ICD-10-CM | POA: Diagnosis not present

## 2021-06-24 ENCOUNTER — Telehealth: Payer: Self-pay | Admitting: Critical Care Medicine

## 2021-06-24 NOTE — Telephone Encounter (Signed)
Copied from [place] [id number]. Topic: Appointment Scheduling - Scheduling Inquiry for Clinic >> Jun 21, 2021 12:13 PM Celene Kras wrote: Reason for CRM: Marya Amsler, from [facility], calling and is requesting to have an AWV scheduled for pt. He is requesting to have someone follow up with pt to schedule. Please advise.    Callback # [phone]   Called patient to get scheduled for Medicare Wellness visit with Dr. Joya Gaskins. No answer and unable to leave vm. If patient returns call schedule a Medicare Wellness visit.

## 2021-07-14 DIAGNOSIS — Z79899 Other long term (current) drug therapy: Secondary | ICD-10-CM | POA: Diagnosis not present

## 2021-07-16 ENCOUNTER — Ambulatory Visit: Payer: Medicare Other | Admitting: Podiatry

## 2021-07-29 ENCOUNTER — Ambulatory Visit: Payer: Medicare Other

## 2021-08-02 ENCOUNTER — Ambulatory Visit: Payer: Medicare Other | Admitting: Podiatry

## 2021-08-04 ENCOUNTER — Other Ambulatory Visit: Payer: Self-pay | Admitting: Critical Care Medicine

## 2021-08-11 DIAGNOSIS — Z79899 Other long term (current) drug therapy: Secondary | ICD-10-CM | POA: Diagnosis not present

## 2021-08-11 DIAGNOSIS — F209 Schizophrenia, unspecified: Secondary | ICD-10-CM | POA: Diagnosis not present

## 2021-08-15 ENCOUNTER — Encounter: Payer: Self-pay | Admitting: Gastroenterology

## 2021-08-16 ENCOUNTER — Ambulatory Visit: Payer: Medicare HMO | Admitting: Podiatry

## 2021-08-16 ENCOUNTER — Ambulatory Visit: Payer: Medicare Other | Admitting: Podiatry

## 2021-08-16 DIAGNOSIS — Z1211 Encounter for screening for malignant neoplasm of colon: Secondary | ICD-10-CM | POA: Diagnosis not present

## 2021-08-18 ENCOUNTER — Ambulatory Visit: Payer: Medicare Other

## 2021-08-18 ENCOUNTER — Ambulatory Visit (INDEPENDENT_AMBULATORY_CARE_PROVIDER_SITE_OTHER): Payer: Medicare HMO | Admitting: Podiatry

## 2021-08-18 ENCOUNTER — Encounter: Payer: Self-pay | Admitting: Podiatry

## 2021-08-18 ENCOUNTER — Ambulatory Visit
Admission: RE | Admit: 2021-08-18 | Discharge: 2021-08-18 | Disposition: A | Discharge status: Home / Self Care | Payer: Medicare HMO | Source: Ambulatory Visit | Attending: Critical Care Medicine | Admitting: Critical Care Medicine

## 2021-08-18 ENCOUNTER — Ambulatory Visit: Payer: Medicare Other | Admitting: Podiatry

## 2021-08-18 ENCOUNTER — Other Ambulatory Visit: Payer: Self-pay

## 2021-08-18 DIAGNOSIS — E119 Type 2 diabetes mellitus without complications: Secondary | ICD-10-CM

## 2021-08-18 DIAGNOSIS — Z1231 Encounter for screening mammogram for malignant neoplasm of breast: Secondary | ICD-10-CM

## 2021-08-18 DIAGNOSIS — L608 Other nail disorders: Secondary | ICD-10-CM

## 2021-08-18 NOTE — Progress Notes (Signed)
This patient returns to my office for at risk foot care.  This patient requires this care by a professional since this patient will be at risk due to having diabetes.    She has pain on the outside border second toenail left.     This patient presents for at risk foot care today.  General Appearance  Alert, conversant and in no acute stress.  Vascular  Dorsalis pedis and posterior tibial  pulses are palpable  bilaterally.  Capillary return is within normal limits  bilaterally. Temperature is within normal limits  bilaterally.  Neurologic  Senn-Weinstein monofilament wire test within normal limits  bilaterally. Muscle power within normal limits bilaterally.  Nails Pincer nail second toe left foot .  Long thick hallux nails.  No infection noted.  Orthopedic  No limitations of motion  feet .  No crepitus or effusions noted.  No bony pathology or digital deformities noted.  Skin  normotropic skin with no porokeratosis noted bilaterally.  No signs of infections or ulcers noted.     Pincer second toenail left foot.  Consent was obtained for treatment procedures.   Debridement of pincer nail and nail spicules with a nail nipper followed by dremel tool usage.   Return office visit      3 months              Told patient to return for periodic foot care and evaluation due to potential at risk complications.   Gardiner Barefoot DPM

## 2021-08-20 LAB — COLOGUARD: Cologuard: NEGATIVE

## 2021-08-27 ENCOUNTER — Telehealth: Payer: Self-pay

## 2021-08-27 DIAGNOSIS — Z79899 Other long term (current) drug therapy: Secondary | ICD-10-CM | POA: Diagnosis not present

## 2021-08-27 DIAGNOSIS — F209 Schizophrenia, unspecified: Secondary | ICD-10-CM | POA: Diagnosis not present

## 2021-08-27 NOTE — Telephone Encounter (Signed)
Patient name and DOB has been verified Patient was informed of lab results. Patient had no questions.  

## 2021-08-27 NOTE — Telephone Encounter (Signed)
-----   Message from Charlott Rakes, MD sent at 08/23/2021  5:36 PM EDT ----- Please inform her that her mammogram is normal

## 2021-09-01 ENCOUNTER — Ambulatory Visit: Payer: Medicare Other | Admitting: Critical Care Medicine

## 2021-09-06 ENCOUNTER — Other Ambulatory Visit: Payer: Self-pay | Admitting: Critical Care Medicine

## 2021-09-06 NOTE — Telephone Encounter (Signed)
Requested Prescriptions  Pending Prescriptions Disp Refills  . OneTouch Delica Lancets 09U MISC [Pharmacy Med Name: ONE TOUCH DELICA LNC  EA] 045 each 2    Sig: USE TO TEST BLOOD SUGAR     Endocrinology: Diabetes - Testing Supplies Passed - 09/06/2021  3:38 PM      Passed - Valid encounter within last 12 months    Recent Outpatient Visits          3 months ago Primary hypertension   [place] [name], MD   5 months ago Primary hypertension   [place] [name], MD   1 year ago Controlled type 2 diabetes mellitus without complication, without long-term current use of insulin [facility])   [place], Patrick E, MD   1 year ago Pap smear of cervix not needed   [place], MD   1 year ago Moderate persistent asthma without complication   [place], MD      Future Appointments            In 1 week [name], MD [place]           . albuterol (PROAIR HFA) 108 (90 Base) MCG/ACT inhaler 18 g 4    Sig: INHALE TWO PUFFS BY MOUTH EVERY 6 HOURS AS NEEDED FOR SHORTNESS OF BREATH     Pulmonology:  Beta Agonists Failed - 09/06/2021  3:38 PM      Failed - One inhaler should last at least one month. If the patient is requesting refills earlier, contact the patient to check for uncontrolled symptoms.      Passed - Valid encounter within last 12 months    Recent Outpatient Visits          3 months ago Primary hypertension   [place] [name], MD   5 months ago Primary hypertension   [place] [name], MD   1 year ago Controlled type 2 diabetes mellitus without complication, without long-term current use of insulin [facility])   [place], Patrick E, MD   1 year ago Pap smear of cervix not needed   [place], MD   1 year ago Moderate persistent asthma without complication   [place], MD      Future Appointments            In 1 week [name], MD [place]

## 2021-09-14 ENCOUNTER — Ambulatory Visit: Payer: Medicare Other

## 2021-09-14 DIAGNOSIS — F209 Schizophrenia, unspecified: Secondary | ICD-10-CM | POA: Diagnosis not present

## 2021-09-14 DIAGNOSIS — Z79899 Other long term (current) drug therapy: Secondary | ICD-10-CM | POA: Diagnosis not present

## 2021-09-15 ENCOUNTER — Other Ambulatory Visit: Payer: Self-pay

## 2021-09-15 ENCOUNTER — Ambulatory Visit: Payer: Medicare HMO | Attending: Critical Care Medicine | Admitting: Critical Care Medicine

## 2021-09-15 ENCOUNTER — Encounter: Payer: Self-pay | Admitting: Critical Care Medicine

## 2021-09-15 VITALS — BP 117/67 | HR 92 | Resp 16 | Wt 162.8 lb

## 2021-09-15 DIAGNOSIS — I10 Essential (primary) hypertension: Secondary | ICD-10-CM

## 2021-09-15 DIAGNOSIS — R197 Diarrhea, unspecified: Secondary | ICD-10-CM

## 2021-09-15 DIAGNOSIS — M25551 Pain in right hip: Secondary | ICD-10-CM | POA: Diagnosis not present

## 2021-09-15 DIAGNOSIS — J4540 Moderate persistent asthma, uncomplicated: Secondary | ICD-10-CM

## 2021-09-15 DIAGNOSIS — K529 Noninfective gastroenteritis and colitis, unspecified: Secondary | ICD-10-CM

## 2021-09-15 DIAGNOSIS — E119 Type 2 diabetes mellitus without complications: Secondary | ICD-10-CM | POA: Diagnosis not present

## 2021-09-15 DIAGNOSIS — G8929 Other chronic pain: Secondary | ICD-10-CM | POA: Insufficient documentation

## 2021-09-15 DIAGNOSIS — M25562 Pain in left knee: Secondary | ICD-10-CM | POA: Diagnosis not present

## 2021-09-15 DIAGNOSIS — D649 Anemia, unspecified: Secondary | ICD-10-CM | POA: Diagnosis not present

## 2021-09-15 DIAGNOSIS — F209 Schizophrenia, unspecified: Secondary | ICD-10-CM

## 2021-09-15 DIAGNOSIS — F02818 Dementia in other diseases classified elsewhere, unspecified severity, with other behavioral disturbance: Secondary | ICD-10-CM

## 2021-09-15 DIAGNOSIS — E782 Mixed hyperlipidemia: Secondary | ICD-10-CM

## 2021-09-15 LAB — POCT GLYCOSYLATED HEMOGLOBIN (HGB A1C): HbA1c, POC (controlled diabetic range): 6 % (ref 0.0–7.0)

## 2021-09-15 LAB — GLUCOSE, POCT (MANUAL RESULT ENTRY): POC Glucose: 118 mg/dl — AB (ref 70–99)

## 2021-09-15 MED ORDER — RAMIPRIL 2.5 MG PO CAPS: 2.5000 mg | ORAL_CAPSULE | Freq: Every day | ORAL | 5 refills | Status: DC

## 2021-09-15 MED ORDER — FENOFIBRATE 160 MG PO TABS: 160.0000 mg | ORAL_TABLET | Freq: Every day | ORAL | 6 refills | Status: DC

## 2021-09-15 MED ORDER — FAMOTIDINE 20 MG PO TABS: 20.0000 mg | ORAL_TABLET | Freq: Two times a day (BID) | ORAL | 2 refills | Status: DC

## 2021-09-15 MED ORDER — ALENDRONATE SODIUM 70 MG PO TABS: 70.0000 mg | ORAL_TABLET | ORAL | 3 refills | Status: DC

## 2021-09-15 MED ORDER — DONEPEZIL HCL 10 MG PO TABS: 10.0000 mg | ORAL_TABLET | Freq: Every day | ORAL | 1 refills | Status: DC

## 2021-09-15 MED ORDER — METFORMIN HCL 500 MG PO TABS: 500.0000 mg | ORAL_TABLET | Freq: Two times a day (BID) | ORAL | 6 refills | Status: DC

## 2021-09-15 NOTE — Patient Instructions (Signed)
Refills on all medications sent to [facility]  Referral to orthopedics and gastroenterology was made  Complete set of lab screening to be obtained today  Return to see Dr. Joya Gaskins 3 months

## 2021-09-15 NOTE — Assessment & Plan Note (Signed)
Continue Aricept 

## 2021-09-15 NOTE — Assessment & Plan Note (Signed)
Chronic diarrhea without infectious origin will refer to gastroenterology for further evaluation and will obtain for the patient depends

## 2021-09-15 NOTE — Assessment & Plan Note (Signed)
Knee painChronic right hip and left will refer to orthopedics

## 2021-09-15 NOTE — Assessment & Plan Note (Signed)
Blood pressure well controlled at this time we will continue with low-dose Altace

## 2021-09-15 NOTE — Assessment & Plan Note (Signed)
Patient intolerant of statins will obtain and continue omega-3 fish oil and fenofibrate

## 2021-09-15 NOTE — Assessment & Plan Note (Signed)
Continue clozapine

## 2021-09-15 NOTE — Assessment & Plan Note (Signed)
Moderate persistent asthma stable continue Symbicort as needed albuterol

## 2021-09-15 NOTE — Progress Notes (Signed)
Established Patient Office Visit  Subjective:  Patient ID: Elizabeth Schwartz, female    DOB: 05-21-1946  Age: 75 y.o. MRN: [id number]  CC:  Chief Complaint  Patient presents with   Hypertension   Diabetes    HPI Elizabeth Schwartz presents for primary care follow-up.  She has an issue with loose stools and bowel incontinence.  She is having to wear depends now.  She states there is mucus in the diarrhea.  Note we have worked this up in the past with stools for pathogens and this was negative.  Patient denies any nausea or vomiting or other GI complaints.  Patient also complains of chronic right hip and left knee pain.  She has not had any falls.  Patient also continues with her mental health medications.  She has history of diagnosed schizophrenia and neurocognitive disorder due to multiple etiologies.  Her diabetes is well controlled and on arrival hemoglobin A1c was 6.0.  Blood pressure is good at 117/67.  Patient's not had any flareups of her asthma as well.  Patient did have a Cologuard test and we did not receive the result the patient was told it was negative.  Past Medical History:  Diagnosis Date   Anemia    COPD (chronic obstructive pulmonary disease) (HCC)    Depression    Diabetes mellitus    GERD (gastroesophageal reflux disease)    Hyperlipidemia    Hypertension    Osteoporosis    Schizophrenia, schizo-affective ([place])    SOB (shortness of breath) on exertion    Syncope 06/11/2019    Past Surgical History:  Procedure Laterality Date   BREAST BIOPSY Right 2017   benign   LAPAROSCOPY  1975   ABD pain   VAGINAL HYSTERECTOMY  1975    No family history on file.  Social History   Socioeconomic History   Marital status: Single    Spouse name: Not on file   Number of children: Not on file   Years of education: Not on file   Highest education level: Not on file  Occupational History   Not on file  Tobacco Use   Smoking status: Former    Years: 15.00    Types:  Cigarettes    Quit date: 02/28/2012    Years since quitting: 9.5   Smokeless tobacco: Former    Types: Nurse, children's Use: Never used  Substance and Sexual Activity   Alcohol use: Yes    Comment: "socially"   Drug use: No   Sexual activity: Not Currently  Other Topics Concern   Not on file  Social History Narrative   Not on file   Social Determinants of Health   Financial Resource Strain: Not on file  Food Insecurity: Not on file  Transportation Needs: Not on file  Physical Activity: Not on file  Stress: Not on file  Social Connections: Not on file  Intimate Partner Violence: Not on file    Outpatient Medications Prior to Visit  Medication Sig Dispense Refill   acetaminophen (TYLENOL) 500 MG tablet Take 500 mg by mouth 2 (two) times daily.     albuterol (PROAIR HFA) 108 (90 Base) MCG/ACT inhaler INHALE TWO PUFFS BY MOUTH EVERY 6 HOURS AS NEEDED FOR SHORTNESS OF BREATH 18 g 4   calcium carbonate (OS-CAL) 600 MG TABS Take 600 mg by mouth 2 (two) times daily with a meal.     clozapine (CLOZARIL) 200 MG tablet Take 1 tablet by mouth at  bedtime.      diclofenac Sodium (VOLTAREN) 1 % GEL Apply twice a day to affected area 50 g 3   fish oil-omega-3 fatty acids 1000 MG capsule Take 1 g by mouth every morning.     OneTouch Delica Lancets 99I MISC USE TO TEST BLOOD SUGAR 100 each 2   ONETOUCH VERIO test strip Use to test blood sugar 100 each 2   SYMBICORT 160-4.5 MCG/ACT inhaler Inhale 2 puffs into the lungs in the morning and at bedtime. 10.2 g 11   vitamin C (ASCORBIC ACID) 500 MG tablet Take 500 mg by mouth every morning.     VITAMIN D, CHOLECALCIFEROL, PO Take 600 Units by mouth daily.     alendronate (FOSAMAX) 70 MG tablet Take 1 tablet (70 mg total) by mouth once a week. 12 tablet 3   donepezil (ARICEPT) 10 MG tablet Take 1 tablet (10 mg total) by mouth at bedtime. 60 tablet 1   famotidine (PEPCID) 20 MG tablet TAKE ONE TABLET BY MOUTH TWICE A DAY 60 tablet 2    fenofibrate 160 MG tablet Take 1 tablet (160 mg total) by mouth daily. 60 tablet 6   metFORMIN (GLUCOPHAGE) 500 MG tablet Take 1 tablet (500 mg total) by mouth 2 (two) times daily with a meal. 60 tablet 6   ramipril (ALTACE) 2.5 MG capsule Take 1 capsule (2.5 mg total) by mouth daily. 30 capsule 5   No facility-administered medications prior to visit.    Allergies  Allergen Reactions   Crestor [Rosuvastatin]     Muscle aches.     ROS Review of Systems  Constitutional: Negative.   HENT: Negative.  Negative for ear pain, postnasal drip, rhinorrhea, sinus pressure, sore throat, trouble swallowing and voice change.   Eyes: Negative.   Respiratory: Negative.  Negative for apnea, cough, choking, chest tightness, shortness of breath, wheezing and stridor.   Cardiovascular: Negative.  Negative for chest pain, palpitations and leg swelling.  Gastrointestinal:  Positive for diarrhea. Negative for abdominal distention, abdominal pain, anal bleeding, blood in stool, constipation, nausea, rectal pain and vomiting.  Genitourinary: Negative.   Musculoskeletal:  Positive for arthralgias. Negative for myalgias.       Multiple joint pains  Skin: Negative.  Negative for rash.  Allergic/Immunologic: Negative.  Negative for environmental allergies and food allergies.  Neurological: Negative.  Negative for dizziness, seizures, syncope, facial asymmetry, speech difficulty, weakness, light-headedness, numbness and headaches.  Hematological: Negative.  Negative for adenopathy. Does not bruise/bleed easily.  Psychiatric/Behavioral: Negative.  Negative for agitation, dysphoric mood, hallucinations, sleep disturbance and suicidal ideas. The patient is not nervous/anxious and is not hyperactive.      Objective:    Physical Exam Vitals reviewed.  Constitutional:      Appearance: Normal appearance. She is well-developed. She is not diaphoretic.  HENT:     Head: Normocephalic and atraumatic.     Nose: No  nasal deformity, septal deviation, mucosal edema or rhinorrhea.     Right Sinus: No maxillary sinus tenderness or frontal sinus tenderness.     Left Sinus: No maxillary sinus tenderness or frontal sinus tenderness.     Mouth/Throat:     Pharynx: No oropharyngeal exudate.  Eyes:     General: No scleral icterus.    Conjunctiva/sclera: Conjunctivae normal.     Pupils: Pupils are equal, round, and reactive to light.  Neck:     Thyroid: No thyromegaly.     Vascular: No carotid bruit or JVD.  Trachea: Trachea normal. No tracheal tenderness or tracheal deviation.  Cardiovascular:     Rate and Rhythm: Normal rate and regular rhythm.     Chest Wall: PMI is not displaced.     Pulses: Normal pulses. No decreased pulses.     Heart sounds: Normal heart sounds, S1 normal and S2 normal. Heart sounds not distant. No murmur heard. No systolic murmur is present.  No diastolic murmur is present.    No friction rub. No gallop. No S3 or S4 sounds.  Pulmonary:     Effort: No tachypnea, accessory muscle usage or respiratory distress.     Breath sounds: No stridor. No decreased breath sounds, wheezing, rhonchi or rales.  Chest:     Chest wall: No tenderness.  Abdominal:     General: Bowel sounds are normal. There is no distension.     Palpations: Abdomen is soft. Abdomen is not rigid.     Tenderness: There is no abdominal tenderness. There is no guarding or rebound.  Musculoskeletal:        General: Normal range of motion.     Cervical back: Normal range of motion and neck supple. No edema, erythema or rigidity. No muscular tenderness. Normal range of motion.  Lymphadenopathy:     Head:     Right side of head: No submental or submandibular adenopathy.     Left side of head: No submental or submandibular adenopathy.     Cervical: No cervical adenopathy.  Skin:    General: Skin is warm and dry.     Coloration: Skin is not pale.     Findings: No rash.     Nails: There is no clubbing.   Neurological:     Mental Status: She is alert and oriented to person, place, and time.     Sensory: No sensory deficit.  Psychiatric:        Speech: Speech normal.        Behavior: Behavior normal.    BP 117/67   Pulse 92   Resp 16   Wt 162 lb 12.8 oz (73.8 kg)   SpO2 97%   BMI 28.84 kg/m  Wt Readings from Last 3 Encounters:  09/15/21 162 lb 12.8 oz (73.8 kg)  05/24/21 165 lb (74.8 kg)  03/22/21 169 lb (76.7 kg)     Health Maintenance Due  Topic Date Due   Fecal DNA (Cologuard)  Never done   COVID-19 Vaccine (5 - Booster for Pfizer series) 07/14/2021    There are no preventive care reminders to display for this patient.  Lab Results  Component Value Date   TSH 0.816 08/21/2016   Lab Results  Component Value Date   WBC 6.7 03/22/2021   HGB 9.9 (L) 03/22/2021   HCT 29.8 (L) 03/22/2021   MCV 91 03/22/2021   PLT 292 03/22/2021   Lab Results  Component Value Date   NA 142 03/22/2021   K 4.5 03/22/2021   CO2 21 03/22/2021   GLUCOSE 112 (H) 03/22/2021   BUN 22 03/22/2021   CREATININE 1.21 (H) 03/22/2021   BILITOT <0.2 03/22/2021   ALKPHOS 42 (L) 03/22/2021   AST 25 03/22/2021   ALT 31 03/22/2021   PROT 6.6 03/22/2021   ALBUMIN 4.7 03/22/2021   CALCIUM 9.3 03/22/2021   ANIONGAP 15 03/25/2019   EGFR 47 (L) 03/22/2021   Lab Results  Component Value Date   CHOL 211 (H) 03/22/2021   Lab Results  Component Value Date   HDL 52 03/22/2021     Lab Results  Component Value Date   LDLCALC 116 (H) 03/22/2021   Lab Results  Component Value Date   TRIG 250 (H) 03/22/2021   Lab Results  Component Value Date   CHOLHDL 4.1 03/22/2021   Lab Results  Component Value Date   HGBA1C 6.0 09/15/2021      Assessment & Plan:   Problem List Items Addressed This Visit       Cardiovascular and Mediastinum   Hypertension    Blood pressure well controlled at this time we will continue with low-dose Altace      Relevant Medications   fenofibrate 160 MG tablet    ramipril (ALTACE) 2.5 MG capsule     Respiratory   Asthma, moderate persistent    Moderate persistent asthma stable continue Symbicort as needed albuterol        Digestive   Chronic diarrhea    Chronic diarrhea without infectious origin will refer to gastroenterology for further evaluation and will obtain for the patient depends      Relevant Orders   For home use only DME Other see comment   Ambulatory referral to Gastroenterology     Endocrine   Diabetes type 2, controlled ([place]) - Primary    Type 2 diabetes well-controlled on metformin continue same      Relevant Medications   metFORMIN (GLUCOPHAGE) 500 MG tablet   ramipril (ALTACE) 2.5 MG capsule   Other Relevant Orders   POCT glucose (manual entry) (Completed)   POCT glycosylated hemoglobin (Hb A1C) (Completed)   Microalbumin / creatinine urine ratio   Comprehensive metabolic panel   Lipid panel     Nervous and Auditory   Major neurocognitive disorder due to multiple etiologies with behavioral disturbance    Continue Aricept      Relevant Medications   donepezil (ARICEPT) 10 MG tablet     Other   Schizophrenia (HCC)    Continue clozapine      Anemia   Relevant Orders   CBC with Differential/Platelet   Iron, TIBC and Ferritin Panel   Mixed hyperlipidemia    Patient intolerant of statins will obtain and continue omega-3 fish oil and fenofibrate      Relevant Medications   fenofibrate 160 MG tablet   ramipril (ALTACE) 2.5 MG capsule   Chronic pain of right hip    Knee painChronic right hip and left will refer to orthopedics      Relevant Orders   Ambulatory referral to Orthopedic Surgery   Chronic pain of left knee   Relevant Orders   Ambulatory referral to Orthopedic Surgery    Meds ordered this encounter  Medications   alendronate (FOSAMAX) 70 MG tablet    Sig: Take 1 tablet (70 mg total) by mouth once a week.    Dispense:  12 tablet    Refill:  3   donepezil (ARICEPT) 10 MG tablet     Sig: Take 1 tablet (10 mg total) by mouth at bedtime.    Dispense:  60 tablet    Refill:  1   famotidine (PEPCID) 20 MG tablet    Sig: Take 1 tablet (20 mg total) by mouth 2 (two) times daily.    Dispense:  60 tablet    Refill:  2   fenofibrate 160 MG tablet    Sig: Take 1 tablet (160 mg total) by mouth daily.    Dispense:  60 tablet    Refill:  6   metFORMIN (GLUCOPHAGE) 500 MG tablet  Sig: Take 1 tablet (500 mg total) by mouth 2 (two) times daily with a meal.    Dispense:  60 tablet    Refill:  6   ramipril (ALTACE) 2.5 MG capsule    Sig: Take 1 capsule (2.5 mg total) by mouth daily.    Dispense:  30 capsule    Refill:  5    Follow-up: Return in about 3 months (around 12/16/2021).    Asencion Noble, MD

## 2021-09-15 NOTE — Assessment & Plan Note (Signed)
Type 2 diabetes well-controlled on metformin continue same

## 2021-09-16 ENCOUNTER — Other Ambulatory Visit: Payer: Self-pay | Admitting: Critical Care Medicine

## 2021-09-16 LAB — CBC WITH DIFFERENTIAL/PLATELET
Basophils Absolute: 0.1 10*3/uL (ref 0.0–0.2)
Basos: 1 %
EOS (ABSOLUTE): 0.2 10*3/uL (ref 0.0–0.4)
Eos: 4 %
Hematocrit: 33.2 % — ABNORMAL LOW (ref 34.0–46.6)
Hemoglobin: 10.7 g/dL — ABNORMAL LOW (ref 11.1–15.9)
Immature Grans (Abs): 0 10*3/uL (ref 0.0–0.1)
Immature Granulocytes: 0 %
Lymphocytes Absolute: 1.3 10*3/uL (ref 0.7–3.1)
Lymphs: 27 %
MCH: 29.1 pg (ref 26.6–33.0)
MCHC: 32.2 g/dL (ref 31.5–35.7)
MCV: 90 fL (ref 79–97)
Monocytes Absolute: 0.5 10*3/uL (ref 0.1–0.9)
Monocytes: 9 %
Neutrophils Absolute: 2.9 10*3/uL (ref 1.4–7.0)
Neutrophils: 59 %
Platelets: 294 10*3/uL (ref 150–450)
RBC: 3.68 x10E6/uL — ABNORMAL LOW (ref 3.77–5.28)
RDW: 13.1 % (ref 11.7–15.4)
WBC: 4.9 10*3/uL (ref 3.4–10.8)

## 2021-09-16 LAB — COMPREHENSIVE METABOLIC PANEL
ALT: 28 IU/L (ref 0–32)
AST: 27 IU/L (ref 0–40)
Albumin/Globulin Ratio: 2.6 — ABNORMAL HIGH (ref 1.2–2.2)
Albumin: 4.7 g/dL (ref 3.7–4.7)
Alkaline Phosphatase: 40 IU/L — ABNORMAL LOW (ref 44–121)
BUN/Creatinine Ratio: 21 (ref 12–28)
BUN: 27 mg/dL (ref 8–27)
Bilirubin Total: <0.2 mg/dL (ref 0.0–1.2)
CO2: 23 mmol/L (ref 20–29)
Calcium: 10.2 mg/dL (ref 8.7–10.3)
Chloride: 104 mmol/L (ref 96–106)
Creatinine, Ser: 1.31 mg/dL — ABNORMAL HIGH (ref 0.57–1.00)
Globulin, Total: 1.8 g/dL (ref 1.5–4.5)
Glucose: 113 mg/dL — ABNORMAL HIGH (ref 70–99)
Potassium: 4.6 mmol/L (ref 3.5–5.2)
Sodium: 140 mmol/L (ref 134–144)
Total Protein: 6.5 g/dL (ref 6.0–8.5)
eGFR: 42 mL/min/{1.73_m2} — ABNORMAL LOW (ref >59)

## 2021-09-16 LAB — MICROALBUMIN / CREATININE URINE RATIO
Creatinine, Urine: 196.5 mg/dL
Microalb/Creat Ratio: 9 mg/g creat (ref 0–29)
Microalbumin, Urine: 18.4 ug/mL

## 2021-09-16 LAB — LIPID PANEL
Chol/HDL Ratio: 3.7 ratio (ref 0.0–4.4)
Cholesterol, Total: 221 mg/dL — ABNORMAL HIGH (ref 100–199)
HDL: 59 mg/dL (ref >39)
LDL Chol Calc (NIH): 118 mg/dL — ABNORMAL HIGH (ref 0–99)
Triglycerides: 253 mg/dL — ABNORMAL HIGH (ref 0–149)
VLDL Cholesterol Cal: 44 mg/dL — ABNORMAL HIGH (ref 5–40)

## 2021-09-16 LAB — IRON,TIBC AND FERRITIN PANEL
Ferritin: 85 ng/mL (ref 15–150)
Iron Saturation: 14 % — ABNORMAL LOW (ref 15–55)
Iron: 49 ug/dL (ref 27–139)
Total Iron Binding Capacity: 340 ug/dL (ref 250–450)
UIBC: 291 ug/dL (ref 118–369)

## 2021-09-16 MED ORDER — EZETIMIBE 10 MG PO TABS: 10.0000 mg | ORAL_TABLET | Freq: Every day | ORAL | 3 refills | Status: DC

## 2021-09-16 MED ORDER — IRON (FERROUS SULFATE) 325 (65 FE) MG PO TABS: 325.0000 mg | ORAL_TABLET | Freq: Every day | ORAL | 4 refills | Status: DC

## 2021-09-17 ENCOUNTER — Telehealth: Payer: Self-pay

## 2021-09-17 NOTE — Telephone Encounter (Signed)
Pt was called and is aware of results Dob was confirmed.

## 2021-09-17 NOTE — Telephone Encounter (Signed)
-----   Message from [name], MD sent at 09/16/2021  3:22 PM EDT ----- Let pt know iron levels are low and she has mild anemia, sending Rx for an iron tablet to her pharmacy,  kidney liver normal.  No protein in urine so no kidney damage, cholesterol still high, I am sending an rx for Zetia to the pharmacy it is different from crestor and should help lower her cholesterol,  tell her we got her cologuard result and thank her for dropping off   will mail her copy  back to her and document it was negative recheck in three years

## 2021-09-21 ENCOUNTER — Ambulatory Visit: Payer: Medicare Other | Admitting: Podiatry

## 2021-09-22 ENCOUNTER — Other Ambulatory Visit: Payer: Self-pay | Admitting: *Deleted

## 2021-09-22 ENCOUNTER — Ambulatory Visit: Payer: Medicare Other | Admitting: Podiatry

## 2021-09-22 DIAGNOSIS — F209 Schizophrenia, unspecified: Secondary | ICD-10-CM | POA: Diagnosis not present

## 2021-09-22 MED ORDER — FAMOTIDINE 20 MG PO TABS: 20.0000 mg | ORAL_TABLET | Freq: Two times a day (BID) | ORAL | 2 refills | Status: DC

## 2021-09-22 MED ORDER — METFORMIN HCL 500 MG PO TABS: 500.0000 mg | ORAL_TABLET | Freq: Two times a day (BID) | ORAL | 6 refills | Status: DC

## 2021-09-22 MED ORDER — RAMIPRIL 2.5 MG PO CAPS
2.5000 mg | ORAL_CAPSULE | Freq: Every day | ORAL | 5 refills | Status: DC
Start: 2021-09-22 — End: 2021-10-21

## 2021-09-22 MED ORDER — FENOFIBRATE 160 MG PO TABS
160.0000 mg | ORAL_TABLET | Freq: Every day | ORAL | 6 refills | Status: DC
Start: 2021-09-22 — End: 2022-05-31

## 2021-09-22 MED ORDER — SYMBICORT 160-4.5 MCG/ACT IN AERO
2.0000 | INHALATION_SPRAY | Freq: Two times a day (BID) | RESPIRATORY_TRACT | 5 refills | Status: DC
Start: 2021-09-22 — End: 2022-04-06

## 2021-09-22 NOTE — Telephone Encounter (Signed)
All requested medications to be sent to [facility] approved per protocol and sent.

## 2021-09-22 NOTE — Telephone Encounter (Signed)
Call from patient's new pharmacy, [facility] [place]. Patient requesting the following medications be transferred to this pharmacy as Mrs. Elizabeth Schwartz is now in assisted living facility.  Updated the patient's choice of priority pharmacy to the above and kept [facility] as her local and secondary. Routing prescriptions requesting including fenofibrate 160 mg tablet, symbicort 160-4.5 mcg/act inhaler, ramipril 2/5 mg capsule, famotidine 20 mg tablet and metformin 500 mg tablet along with the current dosage instructions and remaining refills (from 09/15/21) to [facility] as requested by the patient and new pharmacy.

## 2021-09-23 ENCOUNTER — Other Ambulatory Visit: Payer: Self-pay

## 2021-09-23 ENCOUNTER — Telehealth: Payer: Self-pay | Admitting: Critical Care Medicine

## 2021-09-23 DIAGNOSIS — N39490 Overflow incontinence: Secondary | ICD-10-CM

## 2021-09-23 DIAGNOSIS — N393 Stress incontinence (female) (male): Secondary | ICD-10-CM

## 2021-09-23 NOTE — Telephone Encounter (Signed)
-----   Message from Jackelyn Knife, [place] sent at 09/22/2021  3:58 PM EST ----- Regarding: depends Dr. Joya Gaskins you are wanting this this patient to have depends. Unfortunately this patient has [place] so she wont be able to get them through [facility]. Would I be able to get another rx for incontinence supplies if possible. I will [facility] it to [facility] to see if this is something they will be able to help with

## 2021-09-23 NOTE — Telephone Encounter (Signed)
Another rx on printer

## 2021-09-23 NOTE — Telephone Encounter (Signed)
Rx has been faxed to [facility]

## 2021-09-24 NOTE — Telephone Encounter (Signed)
I don't know where else to send the rx for supplies will follow up with Opal Sidles on Monday

## 2021-09-24 NOTE — Telephone Encounter (Signed)
Sherry with [facility] called in to verify pt's insurance. She says that pt has to also have medicaid in addition to her [employer] to supply supplies.   Phone: [phone]

## 2021-09-27 DIAGNOSIS — Z79899 Other long term (current) drug therapy: Secondary | ICD-10-CM | POA: Diagnosis not present

## 2021-09-27 DIAGNOSIS — F209 Schizophrenia, unspecified: Secondary | ICD-10-CM | POA: Diagnosis not present

## 2021-09-27 NOTE — Telephone Encounter (Signed)
Spoke with Opal Sidles in regards to this per Opal Sidles we can have pt contact the  [employer] [facility] Address is [place] [address] Phone number is [phone]  Bonne Dolores contacting pt to make aware was unable to reach pt due to v being full. If pt calls back please give information

## 2021-10-12 ENCOUNTER — Telehealth: Payer: Self-pay | Admitting: Critical Care Medicine

## 2021-10-12 DIAGNOSIS — Z79899 Other long term (current) drug therapy: Secondary | ICD-10-CM | POA: Diagnosis not present

## 2021-10-12 DIAGNOSIS — F209 Schizophrenia, unspecified: Secondary | ICD-10-CM | POA: Diagnosis not present

## 2021-10-12 NOTE — Telephone Encounter (Signed)
Pt came in wanting to retrieve paperwork for POA and give and request additional  information regards to POA please call pt to assist thank you

## 2021-10-12 NOTE — Telephone Encounter (Signed)
Called pt mailbox is full. I will call again before I leave today.

## 2021-10-15 ENCOUNTER — Telehealth: Payer: Self-pay

## 2021-10-15 NOTE — Telephone Encounter (Signed)
[employer] agent states pt. Called, but could not remember why she called. Called pt. Back. Voice mailbox is full, unable to leave message.

## 2021-10-18 ENCOUNTER — Encounter (HOSPITAL_COMMUNITY): Payer: Self-pay

## 2021-10-18 ENCOUNTER — Emergency Department (HOSPITAL_COMMUNITY)
Admission: EM | Admit: 2021-10-18 | Discharge: 2021-10-18 | Disposition: A | Discharge status: Home / Self Care | Payer: Medicare HMO | Attending: Emergency Medicine | Admitting: Emergency Medicine

## 2021-10-18 ENCOUNTER — Emergency Department (HOSPITAL_COMMUNITY): Payer: Medicare HMO

## 2021-10-18 ENCOUNTER — Other Ambulatory Visit: Payer: Self-pay

## 2021-10-18 DIAGNOSIS — M25511 Pain in right shoulder: Secondary | ICD-10-CM | POA: Insufficient documentation

## 2021-10-18 DIAGNOSIS — E119 Type 2 diabetes mellitus without complications: Secondary | ICD-10-CM | POA: Diagnosis not present

## 2021-10-18 DIAGNOSIS — R519 Headache, unspecified: Secondary | ICD-10-CM | POA: Insufficient documentation

## 2021-10-18 DIAGNOSIS — M542 Cervicalgia: Secondary | ICD-10-CM | POA: Diagnosis not present

## 2021-10-18 DIAGNOSIS — S79911A Unspecified injury of right hip, initial encounter: Secondary | ICD-10-CM | POA: Diagnosis not present

## 2021-10-18 DIAGNOSIS — S6992XA Unspecified injury of left wrist, hand and finger(s), initial encounter: Secondary | ICD-10-CM | POA: Diagnosis not present

## 2021-10-18 DIAGNOSIS — W06XXXA Fall from bed, initial encounter: Secondary | ICD-10-CM | POA: Diagnosis not present

## 2021-10-18 DIAGNOSIS — M2578 Osteophyte, vertebrae: Secondary | ICD-10-CM | POA: Diagnosis not present

## 2021-10-18 DIAGNOSIS — M25512 Pain in left shoulder: Secondary | ICD-10-CM | POA: Diagnosis not present

## 2021-10-18 DIAGNOSIS — Z7984 Long term (current) use of oral hypoglycemic drugs: Secondary | ICD-10-CM | POA: Diagnosis not present

## 2021-10-18 DIAGNOSIS — S6991XA Unspecified injury of right wrist, hand and finger(s), initial encounter: Secondary | ICD-10-CM | POA: Diagnosis not present

## 2021-10-18 DIAGNOSIS — M25552 Pain in left hip: Secondary | ICD-10-CM | POA: Diagnosis not present

## 2021-10-18 DIAGNOSIS — J449 Chronic obstructive pulmonary disease, unspecified: Secondary | ICD-10-CM | POA: Diagnosis not present

## 2021-10-18 DIAGNOSIS — Z87891 Personal history of nicotine dependence: Secondary | ICD-10-CM | POA: Diagnosis not present

## 2021-10-18 DIAGNOSIS — Z043 Encounter for examination and observation following other accident: Secondary | ICD-10-CM | POA: Diagnosis not present

## 2021-10-18 DIAGNOSIS — I10 Essential (primary) hypertension: Secondary | ICD-10-CM | POA: Insufficient documentation

## 2021-10-18 DIAGNOSIS — M25551 Pain in right hip: Secondary | ICD-10-CM | POA: Diagnosis not present

## 2021-10-18 DIAGNOSIS — S0990XA Unspecified injury of head, initial encounter: Secondary | ICD-10-CM | POA: Diagnosis not present

## 2021-10-18 DIAGNOSIS — W19XXXA Unspecified fall, initial encounter: Secondary | ICD-10-CM

## 2021-10-18 MED ORDER — METHOCARBAMOL 500 MG PO TABS: 500.0000 mg | ORAL_TABLET | Freq: Three times a day (TID) | ORAL | 0 refills | Status: DC | PRN

## 2021-10-18 MED ORDER — METHOCARBAMOL 500 MG PO TABS
500.0000 mg | ORAL_TABLET | Freq: Once | ORAL | Status: AC
  Administered 2021-10-18: 500 mg via ORAL
  Filled 2021-10-18: qty 1

## 2021-10-18 NOTE — ED Provider Notes (Signed)
Elizabeth Schwartz Provider Note   CSN: [id number] Arrival date & time: 10/18/21  1139     History Chief Complaint  Patient presents with   Lytle Michaels    Titiana Severa is a 75 y.o. female history of COPD, diabetes, here presenting with fall.  Patient states that she fell out of bed today and hit the right side of her head as well as right shoulder and right hip.  She states that she was able to walk afterwards.  No meds prior to arrival.  She denies passing out.  States that she is not on any blood thinners  The history is provided by the patient.      Past Medical History:  Diagnosis Date   Anemia    COPD (chronic obstructive pulmonary disease) (HCC)    Depression    Diabetes mellitus    GERD (gastroesophageal reflux disease)    Hyperlipidemia    Hypertension    Osteoporosis    Schizophrenia, schizo-affective ([place])    SOB (shortness of breath) on exertion    Syncope 06/11/2019    Patient Active Problem List   Diagnosis Date Noted   Chronic pain of right hip 09/15/2021   Chronic pain of left knee 09/15/2021   Chronic left shoulder pain 05/24/2021   Mixed hyperlipidemia 03/22/2021   Pincer nail deformity 11/03/2020   Ingrown nail 11/03/2020   Diabetes type 2, controlled ([place]) 06/11/2019   Schizophrenia ([place]) 03/20/2019   Chronic diarrhea 03/20/2019   Hypertension 08/22/2016   Anemia 08/22/2016   Major neurocognitive disorder due to multiple etiologies with behavioral disturbance 08/22/2016   Osteoporosis 08/22/2016   Asthma, moderate persistent 09/08/2011   Diverticulosis 09/08/2011   Former smoker 09/08/2011   GERD 06/04/2007    Past Surgical History:  Procedure Laterality Date   BREAST BIOPSY Right 2017   benign   LAPAROSCOPY  1975   ABD pain   VAGINAL HYSTERECTOMY  1975     OB History   No obstetric history on file.     History reviewed. No pertinent family history.  Social History   Tobacco Use   Smoking status: Former     Years: 15.00    Types: Cigarettes    Quit date: 02/28/2012    Years since quitting: 9.6   Smokeless tobacco: Former    Types: Nurse, children's Use: Never used  Substance Use Topics   Alcohol use: Yes    Comment: "socially"   Drug use: No    Home Medications Prior to Admission medications   Medication Sig Start Date End Date Taking? Authorizing Provider  acetaminophen (TYLENOL) 500 MG tablet Take 500 mg by mouth 2 (two) times daily.    [provider]  albuterol (PROAIR HFA) 108 (90 Base) MCG/ACT inhaler INHALE TWO PUFFS BY MOUTH EVERY 6 HOURS AS NEEDED FOR SHORTNESS OF BREATH 09/06/21   [name], MD  alendronate (FOSAMAX) 70 MG tablet Take 1 tablet (70 mg total) by mouth once a week. 09/15/21   [name], MD  calcium carbonate (OS-CAL) 600 MG TABS Take 600 mg by mouth 2 (two) times daily with a meal.    [provider]  clozapine (CLOZARIL) 200 MG tablet Take 1 tablet by mouth at bedtime.  02/06/19   [provider]  diclofenac Sodium (VOLTAREN) 1 % GEL Apply twice a day to affected area 03/22/21   [name], MD  donepezil (ARICEPT) 10 MG tablet Take 1 tablet (  10 mg total) by mouth at bedtime. 09/15/21   [name], MD  ezetimibe (ZETIA) 10 MG tablet Take 1 tablet (10 mg total) by mouth daily. 09/16/21   [name], MD  famotidine (PEPCID) 20 MG tablet Take 1 tablet (20 mg total) by mouth 2 (two) times daily. 09/22/21   [name], MD  fenofibrate 160 MG tablet Take 1 tablet (160 mg total) by mouth daily. 09/22/21   [name], MD  fish oil-omega-3 fatty acids 1000 MG capsule Take 1 g by mouth every morning.    [provider]  Iron, Ferrous Sulfate, 325 (65 Fe) MG TABS Take 325 mg by mouth daily with breakfast. 09/16/21   [name], MD  metFORMIN (GLUCOPHAGE) 500 MG tablet Take 1 tablet (500 mg total) by mouth 2 (two) times daily with a meal. 09/22/21   [name], MD  OneTouch  Delica Lancets 71G MISC USE TO TEST BLOOD SUGAR 09/06/21   [name], MD  [facility] VERIO test strip Use to test blood sugar 03/22/21   [name], MD  ramipril (ALTACE) 2.5 MG capsule Take 1 capsule (2.5 mg total) by mouth daily. 09/22/21   [name], MD  SYMBICORT 160-4.5 MCG/ACT inhaler Inhale 2 puffs into the lungs in the morning and at bedtime. 09/22/21   [name], MD  vitamin C (ASCORBIC ACID) 500 MG tablet Take 500 mg by mouth every morning.    [provider]  VITAMIN D, CHOLECALCIFEROL, PO Take 600 Units by mouth daily.    [provider]    Allergies    Crestor [rosuvastatin]  Review of Systems   Review of Systems  Musculoskeletal:  Positive for neck pain.       Right shoulder pain  Neurological:  Positive for headaches.  All other systems reviewed and are negative.  Physical Exam Updated Vital Signs BP 130/75   Pulse 98   Temp 97.8 F (36.6 C) (Oral)   Resp 17   SpO2 98%   Physical Exam Vitals and nursing note reviewed.  Constitutional:      Appearance: Normal appearance.  HENT:     Head: Normocephalic.     Comments: No obvious scalp hematoma    Nose: Nose normal.     Mouth/Throat:     Mouth: Mucous membranes are moist.  Eyes:     Extraocular Movements: Extraocular movements intact.     Pupils: Pupils are equal, round, and reactive to light.  Neck:     Comments: Right paracervical tenderness Cardiovascular:     Rate and Rhythm: Normal rate and regular rhythm.     Pulses: Normal pulses.     Heart sounds: Normal heart sounds.  Pulmonary:     Effort: Pulmonary effort is normal.     Breath sounds: Normal breath sounds.  Abdominal:     General: Abdomen is flat.     Palpations: Abdomen is soft.  Musculoskeletal:     Cervical back: Normal range of motion and neck supple.     Comments: Slightly decreased range of motion of bilateral shoulders.  However there is no obvious deformity.  Patient has normal range of  motion bilateral hips.  No midline spinal tenderness.  Patient is able to ambulate by herself  Skin:    Capillary Refill: Capillary refill takes less than 2 seconds.  Neurological:     General: No focal deficit present.     Mental Status: She is alert  and oriented to person, place, and time.     Cranial Nerves: No cranial nerve deficit.     Sensory: No sensory deficit.     Motor: No weakness.  Psychiatric:        Mood and Affect: Mood normal.        Behavior: Behavior normal.    ED Results / Procedures / Treatments   Labs (all labs ordered are listed, but only abnormal results are displayed) Labs Reviewed - No data to display  EKG None  Radiology DG Shoulder Right  Result Date: 10/18/2021 CLINICAL DATA:  [place] from bed, pain EXAM: RIGHT SHOULDER - 2+ VIEW COMPARISON:  None. FINDINGS: There is no evidence of fracture or dislocation. There is no evidence of arthropathy or other focal bone abnormality. Soft tissues are unremarkable. IMPRESSION: Negative. Electronically Signed   By: Lucrezia Europe M.D.   On: 10/18/2021 12:30   CT HEAD WO CONTRAST (5MM)  Result Date: 10/18/2021 CLINICAL DATA:  Neck pain after fall. EXAM: CT HEAD WITHOUT CONTRAST CT CERVICAL SPINE WITHOUT CONTRAST TECHNIQUE: Multidetector CT imaging of the head and cervical spine was performed following the standard protocol without intravenous contrast. Multiplanar CT image reconstructions of the cervical spine were also generated. COMPARISON:  August 20, 2016. FINDINGS: CT HEAD FINDINGS Brain: Mild chronic ischemic white matter disease is noted. No mass effect or midline shift is noted. Ventricular size is within normal limits. There is no evidence of mass lesion, hemorrhage or acute infarction. Vascular: No hyperdense vessel or unexpected calcification. Skull: Normal. Negative for fracture or focal lesion. Sinuses/Orbits: No acute finding. Other: None. CT CERVICAL SPINE FINDINGS Alignment: Normal. Skull base and vertebrae: No  acute fracture. No primary bone lesion or focal pathologic process. Soft tissues and spinal canal: No prevertebral fluid or swelling. No visible canal hematoma. Disc levels: Severe degenerative disc disease is noted at C6-7 with anterior posterior osteophyte formation. Moderate degenerative changes are noted anteriorly at C5-6. Mild anterior osteophyte formation is noted at C3-4 C4-5. Upper chest: Negative. Other: None. IMPRESSION: No acute intracranial abnormality seen. Multilevel degenerative changes are noted in the cervical spine. No fracture or spondylolisthesis is noted. Electronically Signed   By: Marijo Conception M.D.   On: 10/18/2021 12:45   CT Cervical Spine Wo Contrast  Result Date: 10/18/2021 CLINICAL DATA:  Neck pain after fall. EXAM: CT HEAD WITHOUT CONTRAST CT CERVICAL SPINE WITHOUT CONTRAST TECHNIQUE: Multidetector CT imaging of the head and cervical spine was performed following the standard protocol without intravenous contrast. Multiplanar CT image reconstructions of the cervical spine were also generated. COMPARISON:  August 20, 2016. FINDINGS: CT HEAD FINDINGS Brain: Mild chronic ischemic white matter disease is noted. No mass effect or midline shift is noted. Ventricular size is within normal limits. There is no evidence of mass lesion, hemorrhage or acute infarction. Vascular: No hyperdense vessel or unexpected calcification. Skull: Normal. Negative for fracture or focal lesion. Sinuses/Orbits: No acute finding. Other: None. CT CERVICAL SPINE FINDINGS Alignment: Normal. Skull base and vertebrae: No acute fracture. No primary bone lesion or focal pathologic process. Soft tissues and spinal canal: No prevertebral fluid or swelling. No visible canal hematoma. Disc levels: Severe degenerative disc disease is noted at C6-7 with anterior posterior osteophyte formation. Moderate degenerative changes are noted anteriorly at C5-6. Mild anterior osteophyte formation is noted at C3-4 C4-5. Upper  chest: Negative. Other: None. IMPRESSION: No acute intracranial abnormality seen. Multilevel degenerative changes are noted in the cervical spine. No fracture or spondylolisthesis is  noted. Electronically Signed   By: Marijo Conception M.D.   On: 10/18/2021 12:45   DG Hand Complete Left  Result Date: 10/18/2021 CLINICAL DATA:  Trauma, pain EXAM: LEFT HAND - COMPLETE 3+ VIEW COMPARISON:  None. FINDINGS: There is no evidence of fracture or dislocation. There is no evidence of arthropathy or other focal bone abnormality. Soft tissues are unremarkable. IMPRESSION: No recent fracture or dislocation is seen in the left hand. Electronically Signed   By: Elmer Picker M.D.   On: 10/18/2021 12:33   DG Hand Complete Right  Result Date: 10/18/2021 CLINICAL DATA:  Trauma, pain EXAM: RIGHT HAND - COMPLETE 3+ VIEW COMPARISON:  None. FINDINGS: No displaced fracture or dislocation is seen. Evaluation of phalanges and metacarpals in the lateral view is limited due to confluence of structures. IMPRESSION: No recent fracture or dislocation is seen in the right hand. Electronically Signed   By: Elmer Picker M.D.   On: 10/18/2021 12:32   DG Hip Unilat W or Wo Pelvis 2-3 Views Right  Result Date: 10/18/2021 CLINICAL DATA:  Trauma, pain EXAM: DG HIP (WITH OR WITHOUT PELVIS) 2-3V RIGHT COMPARISON:  None. FINDINGS: There is no evidence of hip fracture or dislocation. There is no evidence of arthropathy or other focal bone abnormality. IMPRESSION: No fracture or dislocation is seen in the pelvis and right hip. Electronically Signed   By: Elmer Picker M.D.   On: 10/18/2021 12:35    Procedures Procedures   Medications Ordered in ED Medications  methocarbamol (ROBAXIN) tablet 500 mg (has no administration in time range)    ED Course  I have reviewed the triage vital signs and the nursing notes.  Pertinent labs & imaging results that were available during my care of the patient were reviewed by me and  considered in my medical decision making (see chart for details).    MDM Rules/Calculators/A&P                           Elizabeth Schwartz is a 75 y.o. female here presenting with a fall.  Patient had a mechanical fall from her bed and hit the right side of her head and shoulder and hip.  Patient is ambulatory.  Patient has nonfocal neuro exam.  CT head and neck unremarkable.  X-rays showed no fracture.  I think likely contusion.  Stable for discharge.   Final Clinical Impression(s) / ED Diagnoses Final diagnoses:  None    Rx / DC Orders ED Discharge Orders     None        Drenda Freeze, MD 10/18/21 Einar Crow

## 2021-10-18 NOTE — ED Triage Notes (Signed)
Pt BIB EMS from home. Pt was sleeping in her bed and rolled off the bed onto the floor. Now endorses right hip, right shoulder, and neck pain. Estimated time on the floor is 1.5-2 hours. Denies LOC and denies taking blood thinners. Pt able to ambulate with minimal assistance after fall.

## 2021-10-18 NOTE — ED Provider Notes (Signed)
Emergency Medicine Provider Triage Evaluation Note  Elizabeth Schwartz , a 75 y.o. female  was evaluated in triage.  Pt complains of fall out of bed while sleeping today. She is unsure if she hit her head. Complaining of neck pain, R shoulder, R hip, and bilateral hand pain. Is not anticoagulated. She is A&O X4. No other complaints.  Review of Systems  Positive: + neck pain, R hip pain, R shoulder pain, bilateral hand pain Negative: - LOC  Physical Exam  BP (!) 136/54   Pulse 95   Temp 97.8 F (36.6 C) (Oral)   Resp 16   SpO2 100%  Gen:   Awake, no distress   Resp:  Normal effort  MSK:   Moves extremities without difficulty  Other:  A & O X 4. Following commands. No head trauma appreciated. C collar in place. + midline C spine TTP. + TTP to R shoulder, R hip, bilateral hands.   Medical Decision Making  Medically screening exam initiated at 11:51 AM.  Appropriate orders placed.  Elizabeth Schwartz was informed that the remainder of the evaluation will be completed by another provider, this initial triage assessment does not replace that evaluation, and the importance of remaining in the ED until their evaluation is complete.     Eustaquio Maize, PA-C 10/18/21 1153    Dorie Rank, MD 10/20/21 1011

## 2021-10-18 NOTE — Discharge Instructions (Signed)
Your x-rays and CT scan today did not show any fracture or bleeding.  You can take Tylenol as needed for pain  You can take Robaxin if you have muscle spasm  See your doctor for follow-up   Return to ER if you have another fall, headache, vomiting, severe pain.

## 2021-10-20 ENCOUNTER — Ambulatory Visit: Payer: Self-pay | Admitting: *Deleted

## 2021-10-20 ENCOUNTER — Ambulatory Visit: Payer: Self-pay

## 2021-10-20 NOTE — Telephone Encounter (Addendum)
1700:  Received call from Paramedics. States pts VSS, "Seems fine.' Questioning if she needs to go to  ED as she was just there 10/18/21. Advised per protocol and my recommendation she should be evaluated in ED. Paramedic states seems to be a balance issue. Asked if pt still had appt tomorrow with PCP, it was not cancelled by NT. Asked what final plan was if pt was refusing, states "Still working on that."  I attempted to reach out to practice,unable to reach via teams, left VM with clinical adm.  Also consulted with [facility] team lead, Lattie Haw. Unsure if pt transported to ED as per disposition/recommendation.        'Elizabeth Schwartz' Service Coordinator at [facility] where pt resides calling. States found pt on floor. HAd to break in to apt, paramedics called to assist pt off floor. Pt states does not recall how she ended up on floor but states she had been there all day from morning. Reports right knee, shoulder and hip painful.Linus Orn reports pt off balance and seems confused, "Doesn't look good." Spoke with pt, ADvised ED. Pt resistant to go to ED as she was there Monday.Reiterated need for ED eval. Elizabeth Schwartz states will call EMS. Requesting Social Work follow up if possible. States pt has no one and apartments are [place] living on [address]. States just happened to check on her as she was bringing her a holiday meal. [facility] had secured appt for tomorrow prior to triage please cancel if appropriate, if pt does go to ED, she initially refused.  Please advise: Elizabeth Schwartz states may call her: [phone] Reason for Disposition  [1] Unable to get up until help (e.g., caregiver, family, friend) arrived AND [2] on the ground 1 hour or more  Answer Assessment - Initial Assessment Questions 1. MECHANISM: "How did the fall happen?"     Unsure 2. DOMESTIC VIOLENCE AND ELDER ABUSE SCREENING: "Did you fall because someone pushed you or tried to hurt you?" If Yes, ask: "Are you safe now?"      3. ONSET: "When did the fall  happen?" (e.g., minutes, hours, or days ago)     This AM 4. LOCATION: "What part of the body hit the ground?" (e.g., back, buttocks, head, hips, knees, hands, head, stomach)     Right hip, shoulder knee 5. INJURY: "Did you hurt (injure) yourself when you fell?" If Yes, ask: "What did you injure? Tell me more about this?" (e.g., body area; type of injury; pain severity)"     yes 6. PAIN: "Is there any pain?" If Yes, ask: "How bad is the pain?" (e.g., Scale 1-10; or mild,  moderate, severe)   - NONE (0): No pain   - MILD (1-3): Doesn't interfere with normal activities    - MODERATE (4-7): Interferes with normal activities or awakens from sleep    - SEVERE (8-10): Excruciating pain, unable to do any normal activities       7. SIZE: For cuts, bruises, or swelling, ask: "How large is it?" (e.g., inches or centimeters)      *No Answer* 8. PREGNANCY: "Is there any chance you are pregnant?" "When was your last menstrual period?"     *No Answer* 9. OTHER SYMPTOMS: "Do you have any other symptoms?" (e.g., dizziness, fever, weakness; new onset or worsening).      "Off balance" Unsure how fell. 2nd fall in 3 days 10. CAUSE: "What do you think caused the fall (or falling)?" (e.g., tripped, dizzy spell)  Protocols used: Falls and [facility]

## 2021-10-20 NOTE — Telephone Encounter (Signed)
Paramedic [place] called in stating that they received call for 911 to pt's location. Checked pt out and VS and everything looked good no confusion so questioning why EMS was called out. I reached out to [facility] since she was the NT who spoke with Olivia Mackie, coordinator previously. Transferred Maryann into call and I disconnected.

## 2021-10-21 ENCOUNTER — Ambulatory Visit (HOSPITAL_COMMUNITY)
Admission: RE | Admit: 2021-10-21 | Discharge: 2021-10-21 | Disposition: A | Discharge status: Home / Self Care | Payer: Medicare HMO | Source: Ambulatory Visit | Attending: Internal Medicine | Admitting: Internal Medicine

## 2021-10-21 ENCOUNTER — Other Ambulatory Visit: Payer: Self-pay

## 2021-10-21 ENCOUNTER — Encounter: Payer: Self-pay | Admitting: Internal Medicine

## 2021-10-21 ENCOUNTER — Ambulatory Visit: Payer: Medicare HMO | Attending: Internal Medicine | Admitting: Internal Medicine

## 2021-10-21 VITALS — BP 96/58 | HR 94 | Resp 16 | Wt 160.2 lb

## 2021-10-21 DIAGNOSIS — M25512 Pain in left shoulder: Secondary | ICD-10-CM | POA: Diagnosis not present

## 2021-10-21 DIAGNOSIS — W06XXXA Fall from bed, initial encounter: Secondary | ICD-10-CM

## 2021-10-21 DIAGNOSIS — Z9181 History of falling: Secondary | ICD-10-CM

## 2021-10-21 DIAGNOSIS — I952 Hypotension due to drugs: Secondary | ICD-10-CM

## 2021-10-21 DIAGNOSIS — M19012 Primary osteoarthritis, left shoulder: Secondary | ICD-10-CM | POA: Diagnosis not present

## 2021-10-21 NOTE — Patient Instructions (Signed)
Stop ramipril. We have sent a prescription to [facility] for bed rails for you. Go to the radiology department at [facility] to get the x-ray done of your left shoulder.

## 2021-10-21 NOTE — Progress Notes (Signed)
Patient ID: Elizabeth Schwartz, female    DOB: 1946-03-02  MRN: [id number]  CC: Hospitalization Follow-up (ED)   Subjective: Elizabeth Schwartz is a 75 y.o. female who presents for ER f/u.  PCP is Dr. Joya Gaskins Her concerns today include:  Patient with history of HTN, DM,asthma, schizophrenia, osteoporosis  Patient seen in the emergency room 10/18/2021 after she had a fall at home.  Patient states she fell out of bed and hit the right side of her head, shoulder and right hip.  CT scan of the head/neck were negative for any acute fracture.  X-ray of the right hip, left and right hand and right shoulders were also negative for acute fracture.  She complains of being sore all over.  She fell out of bed again yesterday.  She tells me that her mattress is lop-sided and goes downhill on one side. Sometimes when she sits on the edge of the bed she may slide out of the bed onto the floor because of this.  She thinks she is rolling in her sleep and just rolls out of the bed.  On both occasions she did not have any incontinence of bowel or bladder.  She denies vivid dreams.  She denies any headaches, dizziness or blurred vision today.  She lives alone.  She has a life alert. Because of the generalized soreness from her fall, she is started using her Rollator walker.  She is taking Tylenol extra strength 2 tablets once a day.  She was given prescription for Robaxin from the ER.  She took that once and it caused flareup of diarrhea so she discontinued it.  She also thinks that ramipril, her blood pressure medication causes diarrhea for.   Patient Active Problem List   Diagnosis Date Noted   Chronic pain of right hip 09/15/2021   Chronic pain of left knee 09/15/2021   Chronic left shoulder pain 05/24/2021   Mixed hyperlipidemia 03/22/2021   Pincer nail deformity 11/03/2020   Ingrown nail 11/03/2020   Diabetes type 2, controlled ([place]) 06/11/2019   Schizophrenia ([place]) 03/20/2019   Chronic diarrhea 03/20/2019    Hypertension 08/22/2016   Anemia 08/22/2016   Major neurocognitive disorder due to multiple etiologies with behavioral disturbance 08/22/2016   Osteoporosis 08/22/2016   Asthma, moderate persistent 09/08/2011   Diverticulosis 09/08/2011   Former smoker 09/08/2011   GERD 06/04/2007     Current Outpatient Medications on File Prior to Visit  Medication Sig Dispense Refill   acetaminophen (TYLENOL) 500 MG tablet Take 500 mg by mouth 2 (two) times daily.     albuterol (PROAIR HFA) 108 (90 Base) MCG/ACT inhaler INHALE TWO PUFFS BY MOUTH EVERY 6 HOURS AS NEEDED FOR SHORTNESS OF BREATH 18 g 4   alendronate (FOSAMAX) 70 MG tablet Take 1 tablet (70 mg total) by mouth once a week. 12 tablet 3   calcium carbonate (OS-CAL) 600 MG TABS Take 600 mg by mouth 2 (two) times daily with a meal.     clozapine (CLOZARIL) 200 MG tablet Take 1 tablet by mouth at bedtime.      diclofenac Sodium (VOLTAREN) 1 % GEL Apply twice a day to affected area 50 g 3   donepezil (ARICEPT) 10 MG tablet Take 1 tablet (10 mg total) by mouth at bedtime. 60 tablet 1   ezetimibe (ZETIA) 10 MG tablet Take 1 tablet (10 mg total) by mouth daily. 90 tablet 3   famotidine (PEPCID) 20 MG tablet Take 1 tablet (20 mg total) by mouth  2 (two) times daily. 60 tablet 2   fenofibrate 160 MG tablet Take 1 tablet (160 mg total) by mouth daily. 60 tablet 6   fish oil-omega-3 fatty acids 1000 MG capsule Take 1 g by mouth every morning.     Iron, Ferrous Sulfate, 325 (65 Fe) MG TABS Take 325 mg by mouth daily with breakfast. 60 tablet 4   metFORMIN (GLUCOPHAGE) 500 MG tablet Take 1 tablet (500 mg total) by mouth 2 (two) times daily with a meal. 60 tablet 6   methocarbamol (ROBAXIN) 500 MG tablet Take 1 tablet (500 mg total) by mouth every 8 (eight) hours as needed for muscle spasms. 10 tablet 0   OneTouch Delica Lancets 36R MISC USE TO TEST BLOOD SUGAR 100 each 2   ONETOUCH VERIO test strip Use to test blood sugar 100 each 2   SYMBICORT 160-4.5  MCG/ACT inhaler Inhale 2 puffs into the lungs in the morning and at bedtime. 10.2 g 5   vitamin C (ASCORBIC ACID) 500 MG tablet Take 500 mg by mouth every morning.     VITAMIN D, CHOLECALCIFEROL, PO Take 600 Units by mouth daily.     No current facility-administered medications on file prior to visit.    Allergies  Allergen Reactions   Crestor [Rosuvastatin]     Muscle aches.     Social History   Socioeconomic History   Marital status: Single    Spouse name: Not on file   Number of children: Not on file   Years of education: Not on file   Highest education level: Not on file  Occupational History   Not on file  Tobacco Use   Smoking status: Former    Years: 15.00    Types: Cigarettes    Quit date: 02/28/2012    Years since quitting: 9.6   Smokeless tobacco: Former    Types: Nurse, children's Use: Never used  Substance and Sexual Activity   Alcohol use: Yes    Comment: "socially"   Drug use: No   Sexual activity: Not Currently  Other Topics Concern   Not on file  Social History Narrative   Not on file   Social Determinants of Health   Financial Resource Strain: Not on file  Food Insecurity: Not on file  Transportation Needs: Not on file  Physical Activity: Not on file  Stress: Not on file  Social Connections: Not on file  Intimate Partner Violence: Not on file    No family history on file.  Past Surgical History:  Procedure Laterality Date   BREAST BIOPSY Right 2017   benign   LAPAROSCOPY  1975   ABD pain   VAGINAL HYSTERECTOMY  1975    ROS: Review of Systems Negative except as stated above  PHYSICAL EXAM: BP (!) 96/58   Pulse 94   Resp 16   Wt 160 lb 3.2 oz (72.7 kg)   SpO2 97%   BMI 28.38 kg/m   Physical Exam BP 98/50 General appearance - alert, well appearing, pleasant elderly Caucasian female and in no distress Mental status -patient answers questions appropriately. Chest - clear to auscultation, no wheezes, rales or rhonchi,  symmetric air entry Heart - normal rate, regular rhythm, normal S1, S2, no murmurs, rubs, clicks or gallops Musculoskeletal -patient with good range of motion of her neck, right upper extremity.  She has moderate discomfort with attempted passive range of motion of the left shoulder with guarding of the joint.  Good  range of motion of the left elbow and wrist.  No discomfort on palpation of the knees.  She has slowed range of motion of both knees but without significant pain.  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   CMP Latest Ref Rng & Units 09/15/2021 03/22/2021 03/25/2019  Glucose 70 - 99 mg/dL 113(H) 112(H) 117(H)  BUN 8 - 27 mg/dL 27 22 22   Creatinine 0.57 - 1.00 mg/dL 1.31(H) 1.21(H) 1.18(H)  Sodium 134 - 144 mmol/L 140 142 141  Potassium 3.5 - 5.2 mmol/L 4.6 4.5 4.5  Chloride 96 - 106 mmol/L 104 102 103  CO2 20 - 29 mmol/L 23 21 23   Calcium 8.7 - 10.3 mg/dL 10.2 9.3 10.2  Total Protein 6.0 - 8.5 g/dL 6.5 6.6 -  Total Bilirubin 0.0 - 1.2 mg/dL <0.2 <0.2 -  Alkaline Phos 44 - 121 IU/L 40(L) 42(L) -  AST 0 - 40 IU/L 27 25 -  ALT 0 - 32 IU/L 28 31 -   Lipid Panel     Component Value Date/Time   CHOL 221 (H) 09/15/2021 1030   TRIG 253 (H) 09/15/2021 1030   HDL 59 09/15/2021 1030   CHOLHDL 3.7 09/15/2021 1030   CHOLHDL 2.9 Ratio 02/10/2010 2036   VLDL 40 02/10/2010 2036   LDLCALC 118 (H) 09/15/2021 1030    CBC    Component Value Date/Time   WBC 4.9 09/15/2021 1030   WBC 6.1 03/25/2019 1147   RBC 3.68 (L) 09/15/2021 1030   RBC 3.95 03/25/2019 1147   HGB 10.7 (L) 09/15/2021 1030   HCT 33.2 (L) 09/15/2021 1030   PLT 294 09/15/2021 1030   MCV 90 09/15/2021 1030   MCH 29.1 09/15/2021 1030   MCH 29.4 03/25/2019 1147   MCHC 32.2 09/15/2021 1030   MCHC 31.2 03/25/2019 1147   RDW 13.1 09/15/2021 1030   LYMPHSABS 1.3 09/15/2021 1030   MONOABS 0.5 08/20/2016 1536   EOSABS 0.2 09/15/2021 1030   BASOSABS 0.1 09/15/2021 1030    ASSESSMENT AND PLAN: 1. History of recent  fall Sounds like this is due to her mattress being significantly lopsided causing her to roll out of bed at nights and also causing her to slide off the bed when she sits on one side of the bed.  I suggest either getting a new mattress or some bed rails.  She is agreeable to me prescribing the bed rails but states she will also look into getting a new mattress.  2. Hypotension due to drugs Blood pressure is low today.  I recommend discontinuing the ramipril and following up with the clinical pharmacist in several weeks for repeat blood pressure check.  Patient also reports that the ramipril is causing diarrhea for her.  3. Acute pain of left shoulder -Advised to continue extra strength Tylenol but she can increase it to 2 tablets twice a day as needed for pain. - DG Shoulder Left; Future  4. Fall from bed, initial encounter Prescription for bed rail sent to [facility]. - For home use only DME Other see comment    Patient was given the opportunity to ask questions.  Patient verbalized understanding of the plan and was able to repeat key elements of the plan.   Orders Placed This Encounter  Procedures   For home use only DME Other see comment   DG Shoulder Left     Requested Prescriptions    No prescriptions requested or ordered in this encounter    Return in about  3 months (around 01/19/2022) for Give 3 mth f/u with Dr. Joya Gaskins.  Appt with Lurena Joiner in 4 wks for BP check.  Karle Plumber, MD, FACP

## 2021-10-22 ENCOUNTER — Telehealth: Payer: Self-pay

## 2021-10-22 NOTE — Telephone Encounter (Signed)
Contacted pt to go over lab results pt didn't answer was unable to lvm due to vm being full   Sent a CRM and forward labs to NT to give pt labs when they call back

## 2021-11-18 ENCOUNTER — Ambulatory Visit: Payer: Medicare HMO | Admitting: Pharmacist

## 2021-11-22 ENCOUNTER — Ambulatory Visit (INDEPENDENT_AMBULATORY_CARE_PROVIDER_SITE_OTHER): Payer: Medicare Other | Admitting: Podiatry

## 2021-11-22 ENCOUNTER — Encounter: Payer: Self-pay | Admitting: Podiatry

## 2021-11-22 ENCOUNTER — Other Ambulatory Visit: Payer: Self-pay

## 2021-11-22 DIAGNOSIS — L608 Other nail disorders: Secondary | ICD-10-CM | POA: Diagnosis not present

## 2021-11-22 DIAGNOSIS — E119 Type 2 diabetes mellitus without complications: Secondary | ICD-10-CM

## 2021-11-22 DIAGNOSIS — Z79899 Other long term (current) drug therapy: Secondary | ICD-10-CM | POA: Diagnosis not present

## 2021-11-22 NOTE — Progress Notes (Signed)
This patient returns to my office for at risk foot care.  This patient requires this care by a professional since this patient will be at risk due to having diabetes.    She has pain on the outside border second toenail left.   She has callus under her big toe joint  both feet.  This patient presents for at risk foot care today.  General Appearance  Alert, conversant and in no acute stress.  Vascular  Dorsalis pedis and posterior tibial  pulses are palpable  bilaterally.  Capillary return is within normal limits  bilaterally. Temperature is within normal limits  bilaterally.  Neurologic  Senn-Weinstein monofilament wire test diminished  bilaterally. Muscle power within normal limits bilaterally.  Nails Pincer nail second toe left foot .  Long thick hallux nails.  No infection noted.  Orthopedic  No limitations of motion  feet .  No crepitus or effusions noted.  No bony pathology or digital deformities noted.  HAV  B/L.  Skin  normotropic skin with no porokeratosis noted bilaterally.  No signs of infections or ulcers noted.     Pincer second toenail left foot.  Callus  B/L.  Consent was obtained for treatment procedures.   Debridement of pincer nail and nail spicules with a nail nipper followed by dremel tool usage.  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  To make an appointment with Aaron Edelman.   Return office visit      3 months              Told patient to return for periodic foot care and evaluation due to potential at risk complications.   Gardiner Barefoot DPM

## 2021-12-02 DIAGNOSIS — H40013 Open angle with borderline findings, low risk, bilateral: Secondary | ICD-10-CM | POA: Diagnosis not present

## 2021-12-13 DIAGNOSIS — Z79899 Other long term (current) drug therapy: Secondary | ICD-10-CM | POA: Diagnosis not present

## 2021-12-22 ENCOUNTER — Encounter: Payer: Self-pay | Admitting: Pharmacist

## 2021-12-22 ENCOUNTER — Ambulatory Visit: Payer: Medicare Other | Attending: Critical Care Medicine | Admitting: Pharmacist

## 2021-12-22 VITALS — BP 115/74

## 2021-12-22 DIAGNOSIS — I10 Essential (primary) hypertension: Secondary | ICD-10-CM

## 2021-12-22 NOTE — Progress Notes (Signed)
° °  S:    Patient arrives in good spirits. Presents to the clinic for hypertension evaluation, counseling, and management. Patient was referred and last seen by Dr. Wynetta Emery on 10/21/2021. BP was 96/58 at that visit. Ramipril was stopped.   Medication adherence: she is not taking the ramipril as instructed.  Current BP Medications include:  none currently   Antihypertensives tried in the past include: ramipril   Dietary habits include: compliant with salt restriction. Does not brink excessive caffeine.  Exercise habits include: none. Gait is slow but steady without the use of an assistive device.  Family / Social history:  -Fhx: no pertinent positives  -Tobacco: former smoker (quit in 2013) -Alcohol: none reported    O:  Vitals:   12/22/21 1419  BP: 115/74   Home BP readings: none  Last 3 Office BP readings: BP Readings from Last 3 Encounters:  10/21/21 (!) 96/58  10/18/21 130/75  09/15/21 117/67    BMET    Component Value Date/Time   NA 140 09/15/2021 1030   K 4.6 09/15/2021 1030   CL 104 09/15/2021 1030   CO2 23 09/15/2021 1030   GLUCOSE 113 (H) 09/15/2021 1030   GLUCOSE 117 (H) 03/25/2019 1147   BUN 27 09/15/2021 1030   CREATININE 1.31 (H) 09/15/2021 1030   CALCIUM 10.2 09/15/2021 1030   GFRNONAA 46 (L) 03/25/2019 1147   GFRAA 53 (L) 03/25/2019 1147    Renal function: CrCl cannot be calculated (Patient's most recent lab result is older than the maximum 21 days allowed.).  Clinical ASCVD: No  The 10-year ASCVD risk score (Arnett DK, et al., 2019) is: 17.7%   Values used to calculate the score:     Age: 76 years     Sex: Female     Is Non-Hispanic African American: No     Diabetic: Yes     Tobacco smoker: No     Systolic Blood Pressure: 96 mmHg     Is BP treated: No     HDL Cholesterol: 59 mg/dL     Total Cholesterol: 221 mg/dL   A/P: Hypertension longstanding currently at goal off of medications. BP Goal = < 130/80 mmHg. Medication adherence: she is  not taking the ramipril as instructed. Will not restart at this time as her BP today looks good.  - No  changes today.  -Counseled on lifestyle modifications for blood pressure control including reduced dietary sodium, increased exercise, adequate sleep.  Results reviewed and written information provided.   Total time in face-to-face counseling 20 minutes.   F/U Clinic Visit in 1 month with Dr. Joya Gaskins.  Benard Halsted, PharmD, [place], [place] [phone]

## 2021-12-28 DIAGNOSIS — Z79899 Other long term (current) drug therapy: Secondary | ICD-10-CM | POA: Diagnosis not present

## 2022-01-12 DIAGNOSIS — R69 Illness, unspecified: Secondary | ICD-10-CM | POA: Diagnosis not present

## 2022-01-12 DIAGNOSIS — F209 Schizophrenia, unspecified: Secondary | ICD-10-CM | POA: Diagnosis not present

## 2022-01-12 DIAGNOSIS — Z79899 Other long term (current) drug therapy: Secondary | ICD-10-CM | POA: Diagnosis not present

## 2022-01-19 ENCOUNTER — Ambulatory Visit: Payer: Medicare HMO | Admitting: Critical Care Medicine

## 2022-01-23 ENCOUNTER — Telehealth: Payer: Self-pay | Admitting: Critical Care Medicine

## 2022-01-23 NOTE — Telephone Encounter (Signed)
Let pt know she needs prevnar 20 pneumonia vaccine and get her appt for this nurse visit ?

## 2022-01-24 NOTE — Progress Notes (Incomplete)
Established Patient Office Visit  Subjective:  Patient ID: Elizabeth Schwartz, female    DOB: Mar 29, 1946  Age: 76 y.o. MRN: [id number]  CC:  No chief complaint on file.   HPI 09/15/21 [name] presents for primary care follow-up.  She has an issue with loose stools and bowel incontinence.  She is having to wear depends now.  She states there is mucus in the diarrhea.  Note we have worked this up in the past with stools for pathogens and this was negative.  Patient denies any nausea or vomiting or other GI complaints.  Patient also complains of chronic right hip and left knee pain.  She has not had any falls.  Patient also continues with her mental health medications.  She has history of diagnosed schizophrenia and neurocognitive disorder due to multiple etiologies.  Her diabetes is well controlled and on arrival hemoglobin A1c was 6.0.  Blood pressure is good at 117/67.  Patient's not had any flareups of her asthma as well.  Patient did have a Cologuard test and we did not receive the result the patient was told it was negative.  01/25/22 PCV 20  Past Medical History:  Diagnosis Date   Anemia    COPD (chronic obstructive pulmonary disease) ([facility])    Depression    Diabetes mellitus    GERD (gastroesophageal reflux disease)    Hyperlipidemia    Hypertension    Osteoporosis    Schizophrenia, schizo-affective ([place])    SOB (shortness of breath) on exertion    Syncope 06/11/2019    Past Surgical History:  Procedure Laterality Date   BREAST BIOPSY Right 2017   benign   LAPAROSCOPY  1975   ABD pain   VAGINAL HYSTERECTOMY  1975    No family history on file.  Social History   Socioeconomic History   Marital status: Single    Spouse name: Not on file   Number of children: Not on file   Years of education: Not on file   Highest education level: Not on file  Occupational History   Not on file  Tobacco Use   Smoking status: Former    Years: 15.00    Types:  Cigarettes    Quit date: 02/28/2012    Years since quitting: 9.9   Smokeless tobacco: Former    Types: Nurse, children's Use: Never used  Substance and Sexual Activity   Alcohol use: Yes    Comment: "socially"   Drug use: No   Sexual activity: Not Currently  Other Topics Concern   Not on file  Social History Narrative   Not on file   Social Determinants of Health   Financial Resource Strain: Not on file  Food Insecurity: Not on file  Transportation Needs: Not on file  Physical Activity: Not on file  Stress: Not on file  Social Connections: Not on file  Intimate Partner Violence: Not on file    Outpatient Medications Prior to Visit  Medication Sig Dispense Refill   acetaminophen (TYLENOL) 500 MG tablet Take 500 mg by mouth 2 (two) times daily.     albuterol (PROAIR HFA) 108 (90 Base) MCG/ACT inhaler INHALE TWO PUFFS BY MOUTH EVERY 6 HOURS AS NEEDED FOR SHORTNESS OF BREATH 18 g 4   alendronate (FOSAMAX) 70 MG tablet Take 1 tablet (70 mg total) by mouth once a week. 12 tablet 3   calcium carbonate (OS-CAL) 600 MG TABS Take 600 mg by mouth 2 (two) times  daily with a meal.     clozapine (CLOZARIL) 200 MG tablet Take 1 tablet by mouth at bedtime.      diclofenac Sodium (VOLTAREN) 1 % GEL Apply twice a day to affected area 50 g 3   donepezil (ARICEPT) 10 MG tablet Take 1 tablet (10 mg total) by mouth at bedtime. 60 tablet 1   ezetimibe (ZETIA) 10 MG tablet Take 1 tablet (10 mg total) by mouth daily. 90 tablet 3   famotidine (PEPCID) 20 MG tablet Take 1 tablet (20 mg total) by mouth 2 (two) times daily. 60 tablet 2   fenofibrate 160 MG tablet Take 1 tablet (160 mg total) by mouth daily. 60 tablet 6   fish oil-omega-3 fatty acids 1000 MG capsule Take 1 g by mouth every morning.     Iron, Ferrous Sulfate, 325 (65 Fe) MG TABS Take 325 mg by mouth daily with breakfast. 60 tablet 4   metFORMIN (GLUCOPHAGE) 500 MG tablet Take 1 tablet (500 mg total) by mouth 2  (two) times daily with a meal. 60 tablet 6   methocarbamol (ROBAXIN) 500 MG tablet Take 1 tablet (500 mg total) by mouth every 8 (eight) hours as needed for muscle spasms. 10 tablet 0   OneTouch Delica Lancets 35K MISC USE TO TEST BLOOD SUGAR 100 each 2   ONETOUCH VERIO test strip Use to test blood sugar 100 each 2   SYMBICORT 160-4.5 MCG/ACT inhaler Inhale 2 puffs into the lungs in the morning and at bedtime. 10.2 g 5   vitamin C (ASCORBIC ACID) 500 MG tablet Take 500 mg by mouth every morning.     VITAMIN D, CHOLECALCIFEROL, PO Take 600 Units by mouth daily.     No facility-administered medications prior to visit.    Allergies  Allergen Reactions   Crestor [Rosuvastatin]     Muscle aches.     ROS Review of Systems  Constitutional: Negative.   HENT: Negative.  Negative for ear pain, postnasal drip, rhinorrhea, sinus pressure, sore throat, trouble swallowing and voice change.   Eyes: Negative.   Respiratory: Negative.  Negative for apnea, cough, choking, chest tightness, shortness of breath, wheezing and stridor.   Cardiovascular: Negative.  Negative for chest pain, palpitations and leg swelling.  Gastrointestinal:  Positive for diarrhea. Negative for abdominal distention, abdominal pain, anal bleeding, blood in stool, constipation, nausea, rectal pain and vomiting.  Genitourinary: Negative.   Musculoskeletal:  Positive for arthralgias. Negative for myalgias.       Multiple joint pains  Skin: Negative.  Negative for rash.  Allergic/Immunologic: Negative.  Negative for environmental allergies and food allergies.  Neurological: Negative.  Negative for dizziness, seizures, syncope, facial asymmetry, speech difficulty, weakness, light-headedness, numbness and headaches.  Hematological: Negative.  Negative for adenopathy. Does not bruise/bleed easily.  Psychiatric/Behavioral: Negative.  Negative for agitation, dysphoric mood, hallucinations, sleep disturbance and suicidal ideas.  The patient is not nervous/anxious and is not hyperactive.      Objective:    Physical Exam Vitals reviewed.  Constitutional:      Appearance: Normal appearance. She is well-developed. She is not diaphoretic.  HENT:     Head: Normocephalic and atraumatic.     Nose: No nasal deformity, septal deviation, mucosal edema or rhinorrhea.     Right Sinus: No maxillary sinus tenderness or frontal sinus tenderness.     Left Sinus: No maxillary sinus tenderness or frontal sinus tenderness.     Mouth/Throat:     Pharynx: No oropharyngeal exudate.  Eyes:  General: No scleral icterus.    Conjunctiva/sclera: Conjunctivae normal.     Pupils: Pupils are equal, round, and reactive to light.  Neck:     Thyroid: No thyromegaly.     Vascular: No carotid bruit or JVD.     Trachea: Trachea normal. No tracheal tenderness or tracheal deviation.  Cardiovascular:     Rate and Rhythm: Normal rate and regular rhythm.     Chest Wall: PMI is not displaced.     Pulses: Normal pulses. No decreased pulses.     Heart sounds: Normal heart sounds, S1 normal and S2 normal. Heart sounds not distant. No murmur heard. No systolic murmur is present.  No diastolic murmur is present.    No friction rub. No gallop. No S3 or S4 sounds.  Pulmonary:     Effort: No tachypnea, accessory muscle usage or respiratory distress.     Breath sounds: No stridor. No decreased breath sounds, wheezing, rhonchi or rales.  Chest:     Chest wall: No tenderness.  Abdominal:     General: Bowel sounds are normal. There is no distension.     Palpations: Abdomen is soft. Abdomen is not rigid.     Tenderness: There is no abdominal tenderness. There is no guarding or rebound.  Musculoskeletal:        General: Normal range of motion.     Cervical back: Normal range of motion and neck supple. No edema, erythema or rigidity. No muscular tenderness. Normal range of motion.  Lymphadenopathy:     Head:     Right side of head: No submental or  submandibular adenopathy.     Left side of head: No submental or submandibular adenopathy.     Cervical: No cervical adenopathy.  Skin:    General: Skin is warm and dry.     Coloration: Skin is not pale.     Findings: No rash.     Nails: There is no clubbing.  Neurological:     Mental Status: She is alert and oriented to person, place, and time.     Sensory: No sensory deficit.  Psychiatric:        Speech: Speech normal.        Behavior: Behavior normal.    There were no vitals taken for this visit. Wt Readings from Last 3 Encounters:  10/21/21 160 lb 3.2 oz (72.7 kg)  09/15/21 162 lb 12.8 oz (73.8 kg)  05/24/21 165 lb (74.8 kg)     Health Maintenance Due  Topic Date Due   COVID-19 Vaccine (5 - Booster for Pfizer series) 07/14/2021    There are no preventive care reminders to display for this patient.  Lab Results  Component Value Date   TSH 0.816 08/21/2016   Lab Results  Component Value Date   WBC 4.9 09/15/2021   HGB 10.7 (L) 09/15/2021   HCT 33.2 (L) 09/15/2021   MCV 90 09/15/2021   PLT 294 09/15/2021   Lab Results  Component Value Date   NA 140 09/15/2021   K 4.6 09/15/2021   CO2 23 09/15/2021   GLUCOSE 113 (H) 09/15/2021   BUN 27 09/15/2021   CREATININE 1.31 (H) 09/15/2021   BILITOT <0.2 09/15/2021   ALKPHOS 40 (L) 09/15/2021   AST 27 09/15/2021   ALT 28 09/15/2021   PROT 6.5 09/15/2021   ALBUMIN 4.7 09/15/2021   CALCIUM 10.2 09/15/2021   ANIONGAP 15 03/25/2019   EGFR 42 (L) 09/15/2021   Lab Results  Component Value Date  CHOL 221 (H) 09/15/2021   Lab Results  Component Value Date   HDL 59 09/15/2021   Lab Results  Component Value Date   LDLCALC 118 (H) 09/15/2021   Lab Results  Component Value Date   TRIG 253 (H) 09/15/2021   Lab Results  Component Value Date   CHOLHDL 3.7 09/15/2021   Lab Results  Component Value Date   HGBA1C 6.0 09/15/2021      Assessment & Plan:   Problem List Items Addressed This Visit    None  No orders of the defined types were placed in this encounter.   Follow-up: No follow-ups on file.    Asencion Noble, MD

## 2022-01-25 ENCOUNTER — Ambulatory Visit: Payer: Medicare HMO | Attending: Critical Care Medicine | Admitting: Critical Care Medicine

## 2022-01-25 ENCOUNTER — Other Ambulatory Visit: Payer: Self-pay

## 2022-01-25 ENCOUNTER — Encounter: Payer: Self-pay | Admitting: Critical Care Medicine

## 2022-01-25 VITALS — BP 124/84 | HR 96 | Wt 161.4 lb

## 2022-01-25 DIAGNOSIS — I10 Essential (primary) hypertension: Secondary | ICD-10-CM

## 2022-01-25 DIAGNOSIS — J4540 Moderate persistent asthma, uncomplicated: Secondary | ICD-10-CM | POA: Diagnosis not present

## 2022-01-25 DIAGNOSIS — F02818 Dementia in other diseases classified elsewhere, unspecified severity, with other behavioral disturbance: Secondary | ICD-10-CM

## 2022-01-25 DIAGNOSIS — Z79899 Other long term (current) drug therapy: Secondary | ICD-10-CM | POA: Diagnosis not present

## 2022-01-25 DIAGNOSIS — E782 Mixed hyperlipidemia: Secondary | ICD-10-CM

## 2022-01-25 DIAGNOSIS — M25512 Pain in left shoulder: Secondary | ICD-10-CM

## 2022-01-25 DIAGNOSIS — G8929 Other chronic pain: Secondary | ICD-10-CM

## 2022-01-25 DIAGNOSIS — E119 Type 2 diabetes mellitus without complications: Secondary | ICD-10-CM

## 2022-01-25 DIAGNOSIS — Z23 Encounter for immunization: Secondary | ICD-10-CM | POA: Diagnosis not present

## 2022-01-25 DIAGNOSIS — F209 Schizophrenia, unspecified: Secondary | ICD-10-CM | POA: Diagnosis not present

## 2022-01-25 DIAGNOSIS — L608 Other nail disorders: Secondary | ICD-10-CM | POA: Diagnosis not present

## 2022-01-25 DIAGNOSIS — R69 Illness, unspecified: Secondary | ICD-10-CM | POA: Diagnosis not present

## 2022-01-25 LAB — GLUCOSE, POCT (MANUAL RESULT ENTRY): POC Glucose: 98 mg/dl (ref 70–99)

## 2022-01-25 NOTE — Assessment & Plan Note (Signed)
Diabetes well controlled at this time continue metformin daily ?

## 2022-01-25 NOTE — Assessment & Plan Note (Signed)
Stable at this time continue Symbicort HFA technique fair I really trained her ?

## 2022-01-25 NOTE — Assessment & Plan Note (Signed)
- 

## 2022-01-25 NOTE — Patient Instructions (Signed)
Stay off ramipril for now ? ?Blood pressure is improved ? ?No change in any of the other medications ? ?We discussed your memory and offered a neurology visit we decided to hold off for now ? ?No labs are indicated at this visit ? ?Return to see Dr. Joya Gaskins 4 months ? ?Prevnar 20 pneumonia vaccine was given ? ? ?

## 2022-01-25 NOTE — Assessment & Plan Note (Signed)
Patient plans to follow-up with orthopedics ?

## 2022-01-25 NOTE — Telephone Encounter (Signed)
Pt had appt today and received vaccination  ?

## 2022-01-25 NOTE — Assessment & Plan Note (Signed)
Patient follows with mental health no changes made ?

## 2022-01-25 NOTE — Assessment & Plan Note (Signed)
Blood pressure well controlled on recheck we will monitor off medication ?

## 2022-01-25 NOTE — Assessment & Plan Note (Signed)
I suggested a neurology referral patient declined we will monitor ?

## 2022-01-25 NOTE — Assessment & Plan Note (Signed)
Stable at this time 

## 2022-02-08 DIAGNOSIS — Z79899 Other long term (current) drug therapy: Secondary | ICD-10-CM | POA: Diagnosis not present

## 2022-02-08 DIAGNOSIS — F209 Schizophrenia, unspecified: Secondary | ICD-10-CM | POA: Diagnosis not present

## 2022-02-08 DIAGNOSIS — M19012 Primary osteoarthritis, left shoulder: Secondary | ICD-10-CM | POA: Diagnosis not present

## 2022-02-08 DIAGNOSIS — R69 Illness, unspecified: Secondary | ICD-10-CM | POA: Diagnosis not present

## 2022-02-21 ENCOUNTER — Ambulatory Visit: Payer: Medicare Other | Admitting: Podiatry

## 2022-02-22 ENCOUNTER — Encounter: Payer: Self-pay | Admitting: Podiatry

## 2022-02-22 ENCOUNTER — Ambulatory Visit: Payer: Medicare HMO

## 2022-02-22 ENCOUNTER — Ambulatory Visit (INDEPENDENT_AMBULATORY_CARE_PROVIDER_SITE_OTHER): Payer: Medicare HMO | Admitting: Podiatry

## 2022-02-22 DIAGNOSIS — M79674 Pain in right toe(s): Secondary | ICD-10-CM | POA: Diagnosis not present

## 2022-02-22 DIAGNOSIS — E119 Type 2 diabetes mellitus without complications: Secondary | ICD-10-CM

## 2022-02-22 DIAGNOSIS — R69 Illness, unspecified: Secondary | ICD-10-CM | POA: Diagnosis not present

## 2022-02-22 DIAGNOSIS — M79675 Pain in left toe(s): Secondary | ICD-10-CM

## 2022-02-22 DIAGNOSIS — F209 Schizophrenia, unspecified: Secondary | ICD-10-CM | POA: Diagnosis not present

## 2022-02-22 DIAGNOSIS — L608 Other nail disorders: Secondary | ICD-10-CM | POA: Diagnosis not present

## 2022-02-22 DIAGNOSIS — B351 Tinea unguium: Secondary | ICD-10-CM | POA: Diagnosis not present

## 2022-02-22 DIAGNOSIS — M2010 Hallux valgus (acquired), unspecified foot: Secondary | ICD-10-CM

## 2022-02-22 DIAGNOSIS — Z79899 Other long term (current) drug therapy: Secondary | ICD-10-CM | POA: Diagnosis not present

## 2022-02-22 NOTE — Progress Notes (Signed)
SITUATION ?Reason for Consult: Evaluation for Prefabricated Diabetic Shoes and Custom Diabetic Inserts. ?Patient / Caregiver Report: Patient would like well fitting shoes ? ?OBJECTIVE DATA: ?Patient History / Diagnosis:  ?  ICD-10-CM   ?1. Controlled type 2 diabetes mellitus without complication, without long-term current use of insulin (HCC)  E11.9   ?  ? ? ?Physician Treating Diabetes:  [name], MD ? ?Current or Previous Devices:   Current User ? ?In-Person Foot Examination: ?Ulcers & Callousing:   None ?Deformities:    Bunions bilateral ?Sensation:    Intact  ?Shoe Size:     7.5W ? ?ORTHOTIC RECOMMENDATION ?Recommended Devices: ?- 1x pair prefabricated PDAC approved diabetic shoes; Patient Selected Orth[employer] 849 Black Size 7.5W ?- 3x pair custom-to-patient PDAC approved vacuum formed diabetic insoles. ? ?GOALS OF SHOES AND INSOLES ?- Reduce shear and pressure ?- Reduce / Prevent callus formation ?- Reduce / Prevent ulceration ?- Protect the fragile healing compromised diabetic foot. ? ?Patient would benefit from diabetic shoes and inserts as patient has diabetes mellitus and the patient has one or more of the following conditions: ?- History of partial or complete amputation of the foot ?- History of previous foot ulceration. ?- History of pre-ulcerative callus ?- Peripheral neuropathy with evidence of callus formation ?- Foot deformity ?- Poor circulation ? ?ACTIONS PERFORMED ?Potential out of pocket cost was communicated to patient. Patient understood and consented to measurement and casting. Patient was casted for insoles via crush box and measured for shoes via brannock device. Procedure was explained and patient tolerated procedure well. All questions were answered and concerns addressed. Casts were shipped to central fabrication for HOLD until Certificate of Medical Necessity or otherwise necessary authorization from insurance is obtained. ? ?PLAN ?Shoes are to be ordered and casts released  from hold once all appropriate paperwork is complete. Patient is to be contacted and scheduled for fitting once shoes and insoles have been fabricated and received. ? ?

## 2022-02-22 NOTE — Progress Notes (Signed)
This patient returns to my office for at risk foot care.  This patient requires this care by a professional since this patient will be at risk due to having diabetes.    She has pain on the outside border second toenail left.   She has callus under her big toe joint  both feet.  This patient presents for at risk foot care today. ? ?General Appearance  Alert, conversant and in no acute stress. ? ?Vascular  Dorsalis pedis and posterior tibial  pulses are palpable  bilaterally.  Capillary return is within normal limits  bilaterally. Temperature is within normal limits  bilaterally. ? ?Neurologic  Senn-Weinstein monofilament wire test diminished  bilaterally. Muscle power within normal limits bilaterally. ? ?Nails Pincer nail second toe left foot .  Long thick hallux nails.  No infection noted. ? ?Orthopedic  No limitations of motion  feet .  No crepitus or effusions noted.  No bony pathology or digital deformities noted.  HAV  B/L. ? ?Skin  normotropic skin with no porokeratosis noted bilaterally.  No signs of infections or ulcers noted.    ? ?Pincer second toenail left foot.  Callus  B/L. ? ?Consent was obtained for treatment procedures.   Debridement of pincer nail and nail spicules with a nail nipper followed by dremel tool usage.  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  Patient seeing  Lauro Franklin today. ? ? ?Return office visit      3 months              Told patient to return for periodic foot care and evaluation due to potential at risk complications. ? ? ?Gardiner Barefoot DPM  ?

## 2022-02-28 NOTE — Addendum Note (Signed)
Addended by: Gardiner Barefoot on: 02/28/2022 12:48 PM ? ? Modules accepted: Orders ? ?

## 2022-03-03 ENCOUNTER — Other Ambulatory Visit: Payer: Self-pay | Admitting: Critical Care Medicine

## 2022-03-09 DIAGNOSIS — F209 Schizophrenia, unspecified: Secondary | ICD-10-CM | POA: Diagnosis not present

## 2022-03-09 DIAGNOSIS — Z79899 Other long term (current) drug therapy: Secondary | ICD-10-CM | POA: Diagnosis not present

## 2022-03-09 DIAGNOSIS — R69 Illness, unspecified: Secondary | ICD-10-CM | POA: Diagnosis not present

## 2022-03-22 DIAGNOSIS — Z79899 Other long term (current) drug therapy: Secondary | ICD-10-CM | POA: Diagnosis not present

## 2022-03-22 DIAGNOSIS — F209 Schizophrenia, unspecified: Secondary | ICD-10-CM | POA: Diagnosis not present

## 2022-03-22 DIAGNOSIS — R69 Illness, unspecified: Secondary | ICD-10-CM | POA: Diagnosis not present

## 2022-03-31 DIAGNOSIS — Z7984 Long term (current) use of oral hypoglycemic drugs: Secondary | ICD-10-CM | POA: Diagnosis not present

## 2022-03-31 DIAGNOSIS — Z7983 Long term (current) use of bisphosphonates: Secondary | ICD-10-CM | POA: Diagnosis not present

## 2022-03-31 DIAGNOSIS — E785 Hyperlipidemia, unspecified: Secondary | ICD-10-CM | POA: Diagnosis not present

## 2022-03-31 DIAGNOSIS — M810 Age-related osteoporosis without current pathological fracture: Secondary | ICD-10-CM | POA: Diagnosis not present

## 2022-03-31 DIAGNOSIS — R32 Unspecified urinary incontinence: Secondary | ICD-10-CM | POA: Diagnosis not present

## 2022-03-31 DIAGNOSIS — Z87891 Personal history of nicotine dependence: Secondary | ICD-10-CM | POA: Diagnosis not present

## 2022-03-31 DIAGNOSIS — J45909 Unspecified asthma, uncomplicated: Secondary | ICD-10-CM | POA: Diagnosis not present

## 2022-03-31 DIAGNOSIS — E119 Type 2 diabetes mellitus without complications: Secondary | ICD-10-CM | POA: Diagnosis not present

## 2022-03-31 DIAGNOSIS — R030 Elevated blood-pressure reading, without diagnosis of hypertension: Secondary | ICD-10-CM | POA: Diagnosis not present

## 2022-04-05 ENCOUNTER — Other Ambulatory Visit: Payer: Self-pay | Admitting: Critical Care Medicine

## 2022-04-06 NOTE — Telephone Encounter (Signed)
Requested Prescriptions  Pending Prescriptions Disp Refills  . metFORMIN (GLUCOPHAGE) 500 MG tablet [Pharmacy Med Name: METFORMIN HCL 500MG TAB] 180 tablet 0    Sig: TAKE ONE TABLET BY MOUTH TWICE A DAY WITH MEALS     Endocrinology:  Diabetes - Biguanides Failed - 04/05/2022  2:45 PM      Failed - Cr in normal range and within 360 days    Creatinine, Ser  Date Value Ref Range Status  09/15/2021 1.31 (H) 0.57 - 1.00 mg/dL Final         Failed - HBA1C is between 0 and 7.9 and within 180 days    HbA1c, POC (prediabetic range)  Date Value Ref Range Status  04/03/2019 5.8 5.7 - 6.4 % Final   HbA1c, POC (controlled diabetic range)  Date Value Ref Range Status  09/15/2021 6.0 0.0 - 7.0 % Final         Failed - eGFR in normal range and within 360 days    GFR calc Af Amer  Date Value Ref Range Status  03/25/2019 53 (L) >60 mL/min Final   GFR calc non Af Amer  Date Value Ref Range Status  03/25/2019 46 (L) >60 mL/min Final   eGFR  Date Value Ref Range Status  09/15/2021 42 (L) >59 mL/min/1.73 Final         Failed - B12 Level in normal range and within 720 days    No results found for: VITAMINB12       Passed - Valid encounter within last 6 months    Recent Outpatient Visits          2 months ago Controlled type 2 diabetes mellitus without complication, without long-term current use of insulin ([place])   [place] [name], MD   3 months ago Primary hypertension   [place], Jarome Matin, RPH-CPP   5 months ago History of recent fall   [place], Neoma Laming B, MD   6 months ago Controlled type 2 diabetes mellitus without complication, without long-term current use of insulin ([place])   [place] [name], MD   10 months ago Primary hypertension   [place] [name], MD      Future  Appointments            In 1 month [name], MD [place] - CBC within normal limits and completed in the last 12 months    WBC  Date Value Ref Range Status  09/15/2021 4.9 3.4 - 10.8 x10E3/uL Final  03/25/2019 6.1 4.0 - 10.5 K/uL Final   RBC  Date Value Ref Range Status  09/15/2021 3.68 (L) 3.77 - 5.28 x10E6/uL Final  03/25/2019 3.95 3.87 - 5.11 MIL/uL Final   Hemoglobin  Date Value Ref Range Status  09/15/2021 10.7 (L) 11.1 - 15.9 g/dL Final   Hematocrit  Date Value Ref Range Status  09/15/2021 33.2 (L) 34.0 - 46.6 % Final   MCHC  Date Value Ref Range Status  09/15/2021 32.2 31.5 - 35.7 g/dL Final  03/25/2019 31.2 30.0 - 36.0 g/dL Final   [address] [facility]  Date Value Ref Range Status  09/15/2021 29.1 26.6 - 33.0 pg Final  03/25/2019 29.4 26.0 - 34.0 pg Final   MCV  Date Value Ref  Range Status  09/15/2021 90 79 - 97 fL Final   No results found for: PLTCOUNTKUC, LABPLAT, POCPLA RDW  Date Value Ref Range Status  09/15/2021 13.1 11.7 - 15.4 % Final         . SYMBICORT 160-4.5 MCG/ACT inhaler [Pharmacy Med Name: SYMBICORT 160-4.5MCG INHA] 10.2 g 1    Sig: INHALE TWO PUFFS INTO THE LUNGS IN THE MORNING AND AT BEDTIME     Pulmonology:  Combination Products Passed - 04/05/2022  2:45 PM      Passed - Valid encounter within last 12 months    Recent Outpatient Visits          2 months ago Controlled type 2 diabetes mellitus without complication, without long-term current use of insulin ([place])   [place] [name], MD   3 months ago Primary hypertension   [place], Jarome Matin, RPH-CPP   5 months ago History of recent fall   [place], MD   6 months ago Controlled type 2 diabetes mellitus without complication, without long-term current use of insulin [facility])   [place], MD   10 months ago Primary hypertension   [place], MD      Future Appointments            In 1 month Joya Gaskins Burnett Harry, MD [place]           . famotidine (PEPCID) 20 MG tablet [Pharmacy Med Name: FAMOTIDINE 20MG TAB] 180 tablet 0    Sig: TAKE ONE TABLET BY MOUTH TWICE A DAY     Gastroenterology:  H2 Antagonists Passed - 04/05/2022  2:45 PM      Passed - Valid encounter within last 12 months    Recent Outpatient Visits          2 months ago Controlled type 2 diabetes mellitus without complication, without long-term current use of insulin ([place])   [place] [name], MD   3 months ago Primary hypertension   [place], Jarome Matin, RPH-CPP   5 months ago History of recent fall   [place], MD   6 months ago Controlled type 2 diabetes mellitus without complication, without long-term current use of insulin [facility])   [place], MD   10 months ago Primary hypertension   [place], MD      Future Appointments            In 1 month Joya Gaskins Burnett Harry, MD [place]           . donepezil (ARICEPT) 10 MG tablet [Pharmacy Med Name: DONEPEZIL HCL 10MG TAB] 90 tablet 0    Sig: TAKE ONE TABLET BY MOUTH AT BEDTIME     Neurology:  Alzheimer's Agents Passed - 04/05/2022  2:45 PM      Passed - Valid encounter within last 6 months    Recent Outpatient Visits          2 months ago Controlled type 2 diabetes mellitus without complication, without long-term current use of insulin ([place])   [place]  [name], MD   3 months ago Primary hypertension   [place], Jarome Matin, RPH-CPP   5 months ago History of recent fall   [place], MD   6 months ago Controlled type 2 diabetes mellitus without complication, without long-term current use of insulin [facility])   [place], MD   10 months ago Primary hypertension   [place], MD      Future Appointments            In 1 month Joya Gaskins Burnett Harry, MD [place]

## 2022-04-07 DIAGNOSIS — Z79899 Other long term (current) drug therapy: Secondary | ICD-10-CM | POA: Diagnosis not present

## 2022-04-07 DIAGNOSIS — F209 Schizophrenia, unspecified: Secondary | ICD-10-CM | POA: Diagnosis not present

## 2022-04-07 DIAGNOSIS — R69 Illness, unspecified: Secondary | ICD-10-CM | POA: Diagnosis not present

## 2022-04-21 ENCOUNTER — Telehealth: Payer: Self-pay | Admitting: Critical Care Medicine

## 2022-04-21 DIAGNOSIS — G720 Drug-induced myopathy: Secondary | ICD-10-CM

## 2022-04-21 NOTE — Telephone Encounter (Signed)
Asked by insurance co to put pt on statin  she has HI allergy to this and cannot take same   She is on fenofibrate and fish oil and zetia and doing well

## 2022-05-03 DIAGNOSIS — R69 Illness, unspecified: Secondary | ICD-10-CM | POA: Diagnosis not present

## 2022-05-03 DIAGNOSIS — F209 Schizophrenia, unspecified: Secondary | ICD-10-CM | POA: Diagnosis not present

## 2022-05-03 DIAGNOSIS — Z79899 Other long term (current) drug therapy: Secondary | ICD-10-CM | POA: Diagnosis not present

## 2022-05-11 DIAGNOSIS — R69 Illness, unspecified: Secondary | ICD-10-CM | POA: Diagnosis not present

## 2022-05-24 ENCOUNTER — Ambulatory Visit: Payer: Medicare HMO | Admitting: Podiatry

## 2022-05-30 ENCOUNTER — Encounter: Payer: Self-pay | Admitting: Pharmacist

## 2022-05-30 DIAGNOSIS — G720 Drug-induced myopathy: Secondary | ICD-10-CM

## 2022-05-30 NOTE — Progress Notes (Signed)
[facility])                                            [place] Team                                        Statin Quality Measure Assessment    05/30/2022  Elisa Kutner [birth year] [id number]     Per review of chart and payor information, patient has a diagnosis of diabetes but is not currently filling a statin prescription.  This places patient into the SUPD (Statin Use In Patients with Diabetes) measure for CMS.    Patient has documented statin intolerance but does not have a statin exclusion code associated with a provider visit.  It is noted, G72.0 was associated with a telephone note in June. Patient has an upcoming follow up appointment on 05/31/22.  If deemed therapeutically  appropriate, a statin exclusion code could be associated with that visit which would remove the patient from the measure.   The 10-year ASCVD risk score (Arnett DK, et al., 2019) is: 27.5%   Values used to calculate the score:     Age: 76 years     Sex: Female     Is Non-Hispanic African American: No     Diabetic: Yes     Tobacco smoker: No     Systolic Blood Pressure: 259 mmHg     Is BP treated: No     HDL Cholesterol: 59 mg/dL     Total Cholesterol: 221 mg/dL 09/15/2021     Component Value Date/Time   CHOL 221 (H) 09/15/2021 1030   TRIG 253 (H) 09/15/2021 1030   HDL 59 09/15/2021 1030   CHOLHDL 3.7 09/15/2021 1030   CHOLHDL 2.9 Ratio 02/10/2010 2036   VLDL 40 02/10/2010 2036   LDLCALC 118 (H) 09/15/2021 1030    Please consider ONE of the following recommendations:  Initiate high intensity statin Atorvastatin '40mg'$  once daily, #90, 3 refills   Rosuvastatin '20mg'$  once daily, #90, 3 refills    Initiate moderate intensity          statin with reduced frequency if prior          statin intolerance 1x weekly, #13, 3 refills   2x weekly, #26, 3 refills   3x weekly, #39, 3 refills    Code for past statin intolerance or  other  exclusions (required annually)   Provider Requirements:  Associate code during an office visit or telehealth encounter  Drug Induced Myopathy G72.0   Myopathy, unspecified G72.9   Myositis, unspecified M60.9   Rhabdomyolysis D63.87   Alcoholic fatty liver F64.3   Cirrhosis of liver K74.69   Prediabetes R73.03   PCOS E28.2   Toxic liver disease, unspecified K71.9        Plan:  Route note to PCP prior to the 05/31/22 office visit.   Elayne Guerin, PharmD, Micro Clinical Pharmacist [phone]

## 2022-05-31 ENCOUNTER — Ambulatory Visit: Payer: Medicare HMO | Attending: Critical Care Medicine | Admitting: Critical Care Medicine

## 2022-05-31 ENCOUNTER — Encounter: Payer: Self-pay | Admitting: Critical Care Medicine

## 2022-05-31 VITALS — BP 124/76 | HR 94 | Wt 165.7 lb

## 2022-05-31 DIAGNOSIS — G720 Drug-induced myopathy: Secondary | ICD-10-CM | POA: Diagnosis not present

## 2022-05-31 DIAGNOSIS — L608 Other nail disorders: Secondary | ICD-10-CM | POA: Diagnosis not present

## 2022-05-31 DIAGNOSIS — I10 Essential (primary) hypertension: Secondary | ICD-10-CM

## 2022-05-31 DIAGNOSIS — F209 Schizophrenia, unspecified: Secondary | ICD-10-CM

## 2022-05-31 DIAGNOSIS — T466X5A Adverse effect of antihyperlipidemic and antiarteriosclerotic drugs, initial encounter: Secondary | ICD-10-CM | POA: Diagnosis not present

## 2022-05-31 DIAGNOSIS — E119 Type 2 diabetes mellitus without complications: Secondary | ICD-10-CM | POA: Diagnosis not present

## 2022-05-31 DIAGNOSIS — D508 Other iron deficiency anemias: Secondary | ICD-10-CM

## 2022-05-31 DIAGNOSIS — E782 Mixed hyperlipidemia: Secondary | ICD-10-CM | POA: Diagnosis not present

## 2022-05-31 DIAGNOSIS — K219 Gastro-esophageal reflux disease without esophagitis: Secondary | ICD-10-CM

## 2022-05-31 DIAGNOSIS — F02818 Dementia in other diseases classified elsewhere, unspecified severity, with other behavioral disturbance: Secondary | ICD-10-CM

## 2022-05-31 DIAGNOSIS — R69 Illness, unspecified: Secondary | ICD-10-CM | POA: Diagnosis not present

## 2022-05-31 DIAGNOSIS — G8929 Other chronic pain: Secondary | ICD-10-CM

## 2022-05-31 DIAGNOSIS — M810 Age-related osteoporosis without current pathological fracture: Secondary | ICD-10-CM

## 2022-05-31 DIAGNOSIS — J4540 Moderate persistent asthma, uncomplicated: Secondary | ICD-10-CM

## 2022-05-31 DIAGNOSIS — M25512 Pain in left shoulder: Secondary | ICD-10-CM

## 2022-05-31 LAB — GLUCOSE, POCT (MANUAL RESULT ENTRY): POC Glucose: 141 mg/dl — AB (ref 70–99)

## 2022-05-31 LAB — POCT GLYCOSYLATED HEMOGLOBIN (HGB A1C): HbA1c, POC (controlled diabetic range): 5.9 % (ref 0.0–7.0)

## 2022-05-31 MED ORDER — IRON (FERROUS SULFATE) 325 (65 FE) MG PO TABS: 325.0000 mg | ORAL_TABLET | Freq: Every day | ORAL | 4 refills | Status: DC

## 2022-05-31 MED ORDER — FENOFIBRATE 160 MG PO TABS: 160.0000 mg | ORAL_TABLET | Freq: Every day | ORAL | 6 refills | Status: DC

## 2022-05-31 MED ORDER — FAMOTIDINE 20 MG PO TABS: 20.0000 mg | ORAL_TABLET | Freq: Two times a day (BID) | ORAL | 1 refills | Status: DC

## 2022-05-31 MED ORDER — EZETIMIBE 10 MG PO TABS: 10.0000 mg | ORAL_TABLET | Freq: Every day | ORAL | 3 refills | Status: DC

## 2022-05-31 MED ORDER — METFORMIN HCL 500 MG PO TABS: 500.0000 mg | ORAL_TABLET | Freq: Two times a day (BID) | ORAL | 2 refills | Status: DC

## 2022-05-31 MED ORDER — ALENDRONATE SODIUM 70 MG PO TABS: 70.0000 mg | ORAL_TABLET | ORAL | 3 refills | Status: DC

## 2022-05-31 MED ORDER — DONEPEZIL HCL 10 MG PO TABS: 10.0000 mg | ORAL_TABLET | Freq: Every day | ORAL | 3 refills | Status: DC

## 2022-05-31 MED ORDER — SYMBICORT 160-4.5 MCG/ACT IN AERO: INHALATION_SPRAY | RESPIRATORY_TRACT | 2 refills | Status: DC

## 2022-05-31 MED ORDER — ALBUTEROL SULFATE HFA 108 (90 BASE) MCG/ACT IN AERS: INHALATION_SPRAY | RESPIRATORY_TRACT | 3 refills | Status: DC

## 2022-05-31 NOTE — Assessment & Plan Note (Signed)
Controlled no change in medication, not currently needing antihypertensives

## 2022-05-31 NOTE — Assessment & Plan Note (Signed)
Cannot take statins due to myopathy continue with fenofibrate fish oil and Zetia

## 2022-05-31 NOTE — Assessment & Plan Note (Signed)
Continue with Aricept

## 2022-05-31 NOTE — Assessment & Plan Note (Signed)
Reassess iron and complete blood counts

## 2022-05-31 NOTE — Progress Notes (Signed)
Notes  Established Patient Office Visit  Subjective:  Patient ID: Crisol Muecke, female    DOB: 1946-10-20  Age: 76 y.o. MRN: [id number]  CC:  Primary care follow-up  HPI 09/15/21 Damaria Vachon presents for primary care follow-up.  She has an issue with loose stools and bowel incontinence.  She is having to wear depends now.  She states there is mucus in the diarrhea.  Note we have worked this up in the past with stools for pathogens and this was negative.  Patient denies any nausea or vomiting or other GI complaints.  Patient also complains of chronic right hip and left knee pain.  She has not had any falls.  Patient also continues with her mental health medications.  She has history of diagnosed schizophrenia and neurocognitive disorder due to multiple etiologies.  Her diabetes is well controlled and on arrival hemoglobin A1c was 6.0.  Blood pressure is good at 117/67.  Patient's not had any flareups of her asthma as well.  Patient did have a Cologuard test and we did not receive the result the patient was told it was negative.  01/25/22 Patient returns in follow-up and has slight increase in memory difficulty otherwise doing reasonably well however on arrival blood pressure initially was elevated but on recheck was 124/84.  She did have a fall in November but has had no falls since that time it was because of a low blood pressure.  Her ramipril was discontinued she is off all blood pressure medicines now.  Blood sugar on arrival was 98.  She brings all her medications with her and we did a complete medication reconciliation at this visit.  The patient needs a Prevnar 20 vaccine and she will receive it.  She is up-to-date speed on all of her other primary care gaps Patient does have some chronic shoulder pain she has been followed by orthopedics plans to call them again she has had injections done  7/18 Patient seen in return follow-up he confirms that she did get severe myopathy with statins and  this is clearly documented in the chart.  She is taking in place of statins fish oil, Zetia, fenofibrate.  She is also trying to follow a healthier diet.  Patient is seen orthopedics recently had injection of the left shoulder with steroids this is improved her left shoulder pain.  She still has pain at the base of the neck.  On arrival blood pressure 124/76.  Her blood sugars at home have ranged 131 170. A1c is good at 5.9.  Patient does complain of some increased blurred vision.  She does have an eye doctor and she will need to see sooner.  Also history of iron deficiency and will need to have her renal function blood levels checked at this visit.  There are no other complaints.  She needs multiple medications refilled.   Past Medical History:  Diagnosis Date   Anemia    COPD (chronic obstructive pulmonary disease) (HCC)    Depression    Diabetes mellitus    GERD (gastroesophageal reflux disease)    Hyperlipidemia    Hypertension    Osteoporosis    Schizophrenia, schizo-affective ([place])    SOB (shortness of breath) on exertion    Syncope 06/11/2019    Past Surgical History:  Procedure Laterality Date   BREAST BIOPSY Right 2017   benign   LAPAROSCOPY  1975   ABD pain   VAGINAL HYSTERECTOMY  1975    History reviewed. No pertinent family history.  Social History   Socioeconomic History   Marital status: Single    Spouse name: Not on file   Number of children: Not on file   Years of education: Not on file   Highest education level: Not on file  Occupational History   Not on file  Tobacco Use   Smoking status: Former    Years: 15.00    Types: Cigarettes    Quit date: 02/28/2012    Years since quitting: 10.2   Smokeless tobacco: Former    Types: Nurse, children's Use: Never used  Substance and Sexual Activity   Alcohol use: Yes    Comment: "socially"   Drug use: No   Sexual activity: Not Currently  Other Topics Concern   Not on file  Social History  Narrative   Not on file   Social Determinants of Health   Financial Resource Strain: Not on file  Food Insecurity: Not on file  Transportation Needs: Not on file  Physical Activity: Not on file  Stress: Not on file  Social Connections: Not on file  Intimate Partner Violence: Not on file    Outpatient Medications Prior to Visit  Medication Sig Dispense Refill   acetaminophen (TYLENOL) 500 MG tablet Take 500 mg by mouth 2 (two) times daily.     calcium carbonate (OS-CAL) 600 MG TABS Take 600 mg by mouth 2 (two) times daily with a meal.     clozapine (CLOZARIL) 200 MG tablet Take 1 tablet by mouth at bedtime.      diclofenac Sodium (VOLTAREN) 1 % GEL Apply twice a day to affected area 50 g 3   fish oil-omega-3 fatty acids 1000 MG capsule Take 1 g by mouth every morning.     Multiple Vitamins-Minerals (CENTRUM SILVER ULTRA WOMENS PO) Take 1 tablet by mouth daily.     OneTouch Delica Lancets 76L MISC USE TO TEST BLOOD SUGAR 100 each 2   ONETOUCH VERIO test strip Use to test blood sugar 100 each 2   vitamin C (ASCORBIC ACID) 500 MG tablet Take 500 mg by mouth every morning.     VITAMIN D, CHOLECALCIFEROL, PO Take 600 Units by mouth daily.     albuterol (VENTOLIN HFA) 108 (90 Base) MCG/ACT inhaler INHALE TWO PUFFS BY MOUTH EVERY 6 HOURS AS NEEDED FOR SHORTNESS OF BREATH 18 g 3   alendronate (FOSAMAX) 70 MG tablet Take 1 tablet (70 mg total) by mouth once a week. 12 tablet 3   donepezil (ARICEPT) 10 MG tablet TAKE ONE TABLET BY MOUTH AT BEDTIME 90 tablet 0   ezetimibe (ZETIA) 10 MG tablet Take 1 tablet (10 mg total) by mouth daily. 90 tablet 3   famotidine (PEPCID) 20 MG tablet TAKE ONE TABLET BY MOUTH TWICE A DAY 180 tablet 0   fenofibrate 160 MG tablet Take 1 tablet (160 mg total) by mouth daily. 60 tablet 6   Iron, Ferrous Sulfate, 325 (65 Fe) MG TABS Take 325 mg by mouth daily with breakfast. 60 tablet 4   metFORMIN (GLUCOPHAGE) 500 MG tablet TAKE ONE TABLET BY MOUTH TWICE A DAY WITH  MEALS 180 tablet 0   methocarbamol (ROBAXIN) 500 MG tablet Take 1 tablet (500 mg total) by mouth every 8 (eight) hours as needed for muscle spasms. 10 tablet 0   omeprazole (PRILOSEC) 20 MG capsule Take 20 mg by mouth daily as needed.     SYMBICORT 160-4.5 MCG/ACT inhaler INHALE TWO PUFFS INTO THE LUNGS IN THE  MORNING AND AT BEDTIME 10.2 g 1   No facility-administered medications prior to visit.    Allergies  Allergen Reactions   Crestor [Rosuvastatin]     Muscle aches.     ROS Review of Systems  Constitutional: Negative.   HENT: Negative.  Negative for ear pain, postnasal drip, rhinorrhea, sinus pressure, sore throat, trouble swallowing and voice change.   Eyes:  Positive for visual disturbance.  Respiratory: Negative.  Negative for apnea, cough, choking, chest tightness, shortness of breath, wheezing and stridor.   Cardiovascular: Negative.  Negative for chest pain, palpitations and leg swelling.  Gastrointestinal:  Negative for abdominal distention, abdominal pain, anal bleeding, blood in stool, constipation, diarrhea, nausea, rectal pain and vomiting.  Genitourinary: Negative.   Musculoskeletal:  Positive for arthralgias. Negative for myalgias.       Multiple joint pains  Skin: Negative.  Negative for rash.  Allergic/Immunologic: Negative.  Negative for environmental allergies and food allergies.  Neurological: Negative.  Negative for dizziness, seizures, syncope, facial asymmetry, speech difficulty, weakness, light-headedness, numbness and headaches.  Hematological: Negative.  Negative for adenopathy. Does not bruise/bleed easily.  Psychiatric/Behavioral: Negative.  Negative for agitation, dysphoric mood, hallucinations, sleep disturbance and suicidal ideas. The patient is not nervous/anxious and is not hyperactive.       Objective:    Physical Exam Vitals reviewed.  Constitutional:      Appearance: Normal appearance. She is well-developed. She is not diaphoretic.  HENT:      Head: Normocephalic and atraumatic.     Nose: No nasal deformity, septal deviation, mucosal edema or rhinorrhea.     Right Sinus: No maxillary sinus tenderness or frontal sinus tenderness.     Left Sinus: No maxillary sinus tenderness or frontal sinus tenderness.     Mouth/Throat:     Pharynx: No oropharyngeal exudate.  Eyes:     General: No scleral icterus.       Right eye: No discharge.        Left eye: No discharge.     Extraocular Movements: Extraocular movements intact.     Conjunctiva/sclera: Conjunctivae normal.     Pupils: Pupils are equal, round, and reactive to light.     Comments: No visual field cuts  Neck:     Thyroid: No thyromegaly.     Vascular: No carotid bruit or JVD.     Trachea: Trachea normal. No tracheal tenderness or tracheal deviation.  Cardiovascular:     Rate and Rhythm: Normal rate and regular rhythm.     Chest Wall: PMI is not displaced.     Pulses: Normal pulses. No decreased pulses.     Heart sounds: Normal heart sounds, S1 normal and S2 normal. Heart sounds not distant. No murmur heard.    No systolic murmur is present.     No diastolic murmur is present.     No friction rub. No gallop. No S3 or S4 sounds.  Pulmonary:     Effort: No tachypnea, accessory muscle usage or respiratory distress.     Breath sounds: No stridor. No decreased breath sounds, wheezing, rhonchi or rales.  Chest:     Chest wall: No tenderness.  Abdominal:     General: Bowel sounds are normal. There is no distension.     Palpations: Abdomen is soft. Abdomen is not rigid.     Tenderness: There is no abdominal tenderness. There is no guarding or rebound.  Musculoskeletal:        General: Normal range of motion.  Cervical back: Normal range of motion and neck supple. No edema, erythema or rigidity. No muscular tenderness. Normal range of motion.  Lymphadenopathy:     Head:     Right side of head: No submental or submandibular adenopathy.     Left side of head: No  submental or submandibular adenopathy.     Cervical: No cervical adenopathy.  Skin:    General: Skin is warm and dry.     Coloration: Skin is not pale.     Findings: No rash.     Nails: There is no clubbing.  Neurological:     Mental Status: She is alert and oriented to person, place, and time.     Sensory: No sensory deficit.  Psychiatric:        Speech: Speech normal.        Behavior: Behavior normal.     BP 124/76   Pulse 94   Wt 165 lb 11.2 oz (75.2 kg)   SpO2 94%   BMI 29.35 kg/m  Wt Readings from Last 3 Encounters:  05/31/22 165 lb 11.2 oz (75.2 kg)  01/25/22 161 lb 6.4 oz (73.2 kg)  10/21/21 160 lb 3.2 oz (72.7 kg)     There are no preventive care reminders to display for this patient.    There are no preventive care reminders to display for this patient.  Lab Results  Component Value Date   TSH 0.816 08/21/2016   Lab Results  Component Value Date   WBC 4.9 09/15/2021   HGB 10.7 (L) 09/15/2021   HCT 33.2 (L) 09/15/2021   MCV 90 09/15/2021   PLT 294 09/15/2021   Lab Results  Component Value Date   NA 140 09/15/2021   K 4.6 09/15/2021   CO2 23 09/15/2021   GLUCOSE 113 (H) 09/15/2021   BUN 27 09/15/2021   CREATININE 1.31 (H) 09/15/2021   BILITOT <0.2 09/15/2021   ALKPHOS 40 (L) 09/15/2021   AST 27 09/15/2021   ALT 28 09/15/2021   PROT 6.5 09/15/2021   ALBUMIN 4.7 09/15/2021   CALCIUM 10.2 09/15/2021   ANIONGAP 15 03/25/2019   EGFR 42 (L) 09/15/2021   Lab Results  Component Value Date   CHOL 221 (H) 09/15/2021   Lab Results  Component Value Date   HDL 59 09/15/2021   Lab Results  Component Value Date   LDLCALC 118 (H) 09/15/2021   Lab Results  Component Value Date   TRIG 253 (H) 09/15/2021   Lab Results  Component Value Date   CHOLHDL 3.7 09/15/2021   Lab Results  Component Value Date   HGBA1C 5.9 05/31/2022      Assessment & Plan:   Problem List Items Addressed This Visit       Cardiovascular and Mediastinum    Hypertension    Controlled no change in medication, not currently needing antihypertensives      Relevant Medications   ezetimibe (ZETIA) 10 MG tablet   fenofibrate 160 MG tablet     Respiratory   Asthma, moderate persistent    Stable at this time      Relevant Medications   albuterol (VENTOLIN HFA) 108 (90 Base) MCG/ACT inhaler   SYMBICORT 160-4.5 MCG/ACT inhaler     Digestive   GERD    With famotidine      Relevant Medications   famotidine (PEPCID) 20 MG tablet     Endocrine   Diabetes type 2, controlled (HCC)    A1c at goal continue metformin  Relevant Medications   metFORMIN (GLUCOPHAGE) 500 MG tablet   Other Relevant Orders   HgB A1c (Completed)   POCT glucose (manual entry) (Completed)   BMP8+eGFR     Nervous and Auditory   Major neurocognitive disorder due to multiple etiologies with behavioral disturbance (HCC)    Continue with Aricept      Relevant Medications   donepezil (ARICEPT) 10 MG tablet     Musculoskeletal and Integument   Osteoporosis    Continue with Fosamax weekly      Relevant Medications   alendronate (FOSAMAX) 70 MG tablet   Pincer nail deformity    Follows with podiatry      Statin myopathy - Primary    Patient with significant statin myopathy therefore she cannot take any statins she will continue though with Zetia, fish oil, and fenofibrate        Other   Schizophrenia ([place])    Continue with mental health care no change in medicines      Anemia    Reassess iron and complete blood counts      Relevant Medications   Iron, Ferrous Sulfate, 325 (65 Fe) MG TABS   Other Relevant Orders   Iron, TIBC and Ferritin Panel   CBC with Differential/Platelet   Mixed hyperlipidemia    Cannot take statins due to myopathy continue with fenofibrate fish oil and Zetia      Relevant Medications   ezetimibe (ZETIA) 10 MG tablet   fenofibrate 160 MG tablet   Other Relevant Orders   Lipid panel   Chronic left shoulder pain     Has follow-up with orthopedics planned      Meds ordered this encounter  Medications   albuterol (VENTOLIN HFA) 108 (90 Base) MCG/ACT inhaler    Sig: INHALE TWO PUFFS BY MOUTH EVERY 6 HOURS AS NEEDED FOR SHORTNESS OF BREATH    Dispense:  18 g    Refill:  3   alendronate (FOSAMAX) 70 MG tablet    Sig: Take 1 tablet (70 mg total) by mouth once a week.    Dispense:  12 tablet    Refill:  3   donepezil (ARICEPT) 10 MG tablet    Sig: Take 1 tablet (10 mg total) by mouth at bedtime.    Dispense:  90 tablet    Refill:  3   ezetimibe (ZETIA) 10 MG tablet    Sig: Take 1 tablet (10 mg total) by mouth daily.    Dispense:  90 tablet    Refill:  3   famotidine (PEPCID) 20 MG tablet    Sig: Take 1 tablet (20 mg total) by mouth 2 (two) times daily.    Dispense:  180 tablet    Refill:  1   fenofibrate 160 MG tablet    Sig: Take 1 tablet (160 mg total) by mouth daily.    Dispense:  60 tablet    Refill:  6   Iron, Ferrous Sulfate, 325 (65 Fe) MG TABS    Sig: Take 325 mg by mouth daily with breakfast.    Dispense:  60 tablet    Refill:  4   metFORMIN (GLUCOPHAGE) 500 MG tablet    Sig: Take 1 tablet (500 mg total) by mouth 2 (two) times daily with a meal.    Dispense:  180 tablet    Refill:  2   SYMBICORT 160-4.5 MCG/ACT inhaler    Sig: INHALE TWO PUFFS INTO THE LUNGS IN THE MORNING AND AT BEDTIME  Dispense:  10.2 g    Refill:  2  38 minutes spent complex decision making high  Follow-up: Return in about 4 months (around 10/01/2022).    Asencion Noble, MD

## 2022-05-31 NOTE — Assessment & Plan Note (Signed)
With famotidine

## 2022-05-31 NOTE — Assessment & Plan Note (Signed)
Continue with mental health care no change in medicines

## 2022-05-31 NOTE — Assessment & Plan Note (Signed)
Continue with Fosamax weekly

## 2022-05-31 NOTE — Assessment & Plan Note (Signed)
A1c at goal continue metformin 

## 2022-05-31 NOTE — Assessment & Plan Note (Signed)
Follows with podiatry 

## 2022-05-31 NOTE — Assessment & Plan Note (Signed)
Has follow-up with orthopedics planned

## 2022-05-31 NOTE — Assessment & Plan Note (Signed)
Stable at this time 

## 2022-05-31 NOTE — Assessment & Plan Note (Signed)
Patient with significant statin myopathy therefore she cannot take any statins she will continue though with Zetia, fish oil, and fenofibrate

## 2022-05-31 NOTE — Patient Instructions (Signed)
No change in medications refill sent to [facility]  Please call your eye doctor Dr. Idolina Primer at [phone] to get an appointment to recheck your eyes with change in vision  Your A1c is excellent 5.9 diabetes under good control  Blood pressure was excellent at this visit  Follow-up with orthopedics regarding her shoulder if he continues to have pain and discomfort in the meantime apply the Voltaren gel twice daily to the shoulder   Return to see Dr. Joya Gaskins 4 months

## 2022-06-01 ENCOUNTER — Telehealth: Payer: Self-pay

## 2022-06-01 LAB — CBC WITH DIFFERENTIAL/PLATELET
Basophils Absolute: 0.1 10*3/uL (ref 0.0–0.2)
Basos: 1 %
EOS (ABSOLUTE): 0.1 10*3/uL (ref 0.0–0.4)
Eos: 2 %
Hematocrit: 31.5 % — ABNORMAL LOW (ref 34.0–46.6)
Hemoglobin: 10.4 g/dL — ABNORMAL LOW (ref 11.1–15.9)
Immature Grans (Abs): 0 10*3/uL (ref 0.0–0.1)
Immature Granulocytes: 0 %
Lymphocytes Absolute: 1.7 10*3/uL (ref 0.7–3.1)
Lymphs: 34 %
MCH: 29.8 pg (ref 26.6–33.0)
MCHC: 33 g/dL (ref 31.5–35.7)
MCV: 90 fL (ref 79–97)
Monocytes Absolute: 0.4 10*3/uL (ref 0.1–0.9)
Monocytes: 8 %
Neutrophils Absolute: 2.6 10*3/uL (ref 1.4–7.0)
Neutrophils: 55 %
Platelets: 304 10*3/uL (ref 150–450)
RBC: 3.49 x10E6/uL — ABNORMAL LOW (ref 3.77–5.28)
RDW: 12.9 % (ref 11.7–15.4)
WBC: 4.9 10*3/uL (ref 3.4–10.8)

## 2022-06-01 LAB — BMP8+EGFR
BUN/Creatinine Ratio: 27 (ref 12–28)
BUN: 36 mg/dL — ABNORMAL HIGH (ref 8–27)
CO2: 21 mmol/L (ref 20–29)
Calcium: 9.9 mg/dL (ref 8.7–10.3)
Chloride: 104 mmol/L (ref 96–106)
Creatinine, Ser: 1.33 mg/dL — ABNORMAL HIGH (ref 0.57–1.00)
Glucose: 119 mg/dL — ABNORMAL HIGH (ref 70–99)
Potassium: 4.5 mmol/L (ref 3.5–5.2)
Sodium: 141 mmol/L (ref 134–144)
eGFR: 42 mL/min/{1.73_m2} — ABNORMAL LOW (ref >59)

## 2022-06-01 LAB — IRON,TIBC AND FERRITIN PANEL
Ferritin: 81 ng/mL (ref 15–150)
Iron Saturation: 21 % (ref 15–55)
Iron: 67 ug/dL (ref 27–139)
Total Iron Binding Capacity: 326 ug/dL (ref 250–450)
UIBC: 259 ug/dL (ref 118–369)

## 2022-06-01 LAB — LIPID PANEL
Chol/HDL Ratio: 3.5 ratio (ref 0.0–4.4)
Cholesterol, Total: 188 mg/dL (ref 100–199)
HDL: 54 mg/dL (ref >39)
LDL Chol Calc (NIH): 94 mg/dL (ref 0–99)
Triglycerides: 239 mg/dL — ABNORMAL HIGH (ref 0–149)
VLDL Cholesterol Cal: 40 mg/dL (ref 5–40)

## 2022-06-01 NOTE — Telephone Encounter (Signed)
Pt was called and is aware of results, DOB was confirmed.  ?

## 2022-06-01 NOTE — Progress Notes (Signed)
Let pt know know kidney stable, blood counts stable, cholesterol at acceptable level , iron levels normal, ok to take one iron pill a day

## 2022-06-01 NOTE — Telephone Encounter (Signed)
-----   Message from [name], MD sent at 06/01/2022  5:51 AM EDT ----- Let pt know know kidney stable, blood counts stable, cholesterol at acceptable level , iron levels normal, ok to take one iron pill a day

## 2022-06-02 ENCOUNTER — Ambulatory Visit: Payer: Self-pay

## 2022-06-02 NOTE — Telephone Encounter (Signed)
Summary: discuss flu shot   Patient states she was to get  a flu shot and forgot to ask provider at 7-18  ov if she can have it done   Please advise    Please call pt when Dr Joya Gaskins puts order in so she can get Flu shot. Thank you! Reason for Disposition . NON-URGENT call redirected to PCP's office because it is open  Protocols used: No Contact or Duplicate Contact Call-A-AH

## 2022-06-02 NOTE — Telephone Encounter (Signed)
Summary: discuss flu shot   Patient states she was to get  a flu shot and forgot to ask provider at 7-18  ov if she can have it done   Please advise    Called pt and LM on VM to call back to [phone] to discuss her question about Flu shot. No egg allergy noted in chart.

## 2022-06-03 ENCOUNTER — Other Ambulatory Visit: Payer: Self-pay | Admitting: Critical Care Medicine

## 2022-06-03 DIAGNOSIS — E119 Type 2 diabetes mellitus without complications: Secondary | ICD-10-CM | POA: Diagnosis not present

## 2022-06-03 DIAGNOSIS — Z0100 Encounter for examination of eyes and vision without abnormal findings: Secondary | ICD-10-CM | POA: Diagnosis not present

## 2022-06-03 DIAGNOSIS — H40013 Open angle with borderline findings, low risk, bilateral: Secondary | ICD-10-CM | POA: Diagnosis not present

## 2022-06-03 NOTE — Telephone Encounter (Signed)
Requested Prescriptions  Pending Prescriptions Disp Refills  . OneTouch Delica Lancets 45W MISC [Pharmacy Med Name: ONE TOUCH DELICA [place]  1    Sig: USE TO TEST BLOOD SUGAR     Endocrinology: Diabetes - Testing Supplies Passed - 06/03/2022  2:29 PM      Passed - Valid encounter within last 12 months    Recent Outpatient Visits          3 days ago Controlled type 2 diabetes mellitus without complication, without long-term current use of insulin ([place])   [place] [name], MD   4 months ago Controlled type 2 diabetes mellitus without complication, without long-term current use of insulin ([place])   [place] [name], MD   5 months ago Primary hypertension   [place], Jarome Matin, RPH-CPP   7 months ago History of recent fall   [place], MD   8 months ago Controlled type 2 diabetes mellitus without complication, without long-term current use of insulin [facility])   [place], MD      Future Appointments            In 4 months Joya Gaskins Burnett Harry, MD [place]           1

## 2022-06-03 NOTE — Telephone Encounter (Signed)
Pt requesting flu shot, Its too early right?

## 2022-06-03 NOTE — Telephone Encounter (Signed)
Correct too soon. Partner with [facility]  we usually get flu vax end of August

## 2022-06-06 NOTE — Telephone Encounter (Signed)
Called and left vm  °

## 2022-06-06 NOTE — Telephone Encounter (Signed)
Pt. Given message and also lab results, verbalizes understanding.

## 2022-06-28 ENCOUNTER — Ambulatory Visit: Payer: Medicare HMO | Admitting: Podiatry

## 2022-06-28 ENCOUNTER — Encounter: Payer: Self-pay | Admitting: Podiatry

## 2022-06-28 DIAGNOSIS — M79675 Pain in left toe(s): Secondary | ICD-10-CM

## 2022-06-28 DIAGNOSIS — L608 Other nail disorders: Secondary | ICD-10-CM

## 2022-06-28 DIAGNOSIS — E119 Type 2 diabetes mellitus without complications: Secondary | ICD-10-CM

## 2022-06-28 DIAGNOSIS — B351 Tinea unguium: Secondary | ICD-10-CM | POA: Diagnosis not present

## 2022-06-28 DIAGNOSIS — M79674 Pain in right toe(s): Secondary | ICD-10-CM

## 2022-06-28 NOTE — Progress Notes (Signed)
This patient returns to my office for at risk foot care.  This patient requires this care by a professional since this patient will be at risk due to having diabetes.    She has pain on the outside border second toenail left.   She has callus under her big toe joint  both feet.  This patient presents for at risk foot care today.  General Appearance  Alert, conversant and in no acute stress.  Vascular  Dorsalis pedis and posterior tibial  pulses are palpable  bilaterally.  Capillary return is within normal limits  bilaterally. Temperature is within normal limits  bilaterally.  Neurologic  Senn-Weinstein monofilament wire test diminished  bilaterally. Muscle power within normal limits bilaterally.  Nails Pincer nail second toe left foot .  Long thick hallux nails.  No infection noted.  Orthopedic  No limitations of motion  feet .  No crepitus or effusions noted.  No bony pathology or digital deformities noted.  HAV  B/L.  Skin  normotropic skin with no porokeratosis noted bilaterally.  No signs of infections or ulcers noted.   Asymptomatic callus sub 1  B/L.  Pincer second toenail left foot.  Callus  B/L.  Consent was obtained for treatment procedures.   Debridement of pincer nail and nail spicules with a nail nipper followed by dremel tool usage.     Return office visit      3 months              Told patient to return for periodic foot care and evaluation due to potential at risk complications.     DPM  

## 2022-07-19 ENCOUNTER — Other Ambulatory Visit: Payer: Self-pay | Admitting: Critical Care Medicine

## 2022-07-19 DIAGNOSIS — Z1231 Encounter for screening mammogram for malignant neoplasm of breast: Secondary | ICD-10-CM

## 2022-07-26 ENCOUNTER — Ambulatory Visit: Payer: Self-pay

## 2022-07-26 DIAGNOSIS — R69 Illness, unspecified: Secondary | ICD-10-CM | POA: Diagnosis not present

## 2022-07-26 NOTE — Telephone Encounter (Signed)
Olivia Mackie is the Education officer, museum for the apartment complex where pt lives, called w/ pt on speaker phone to get an appt w/ Dr Joya Gaskins.  Pt states she has been noticing some confusion, forgetfulness and poor memory lately.  She feels like she needs an appt w/ Dr Joya Gaskins before Dec appt.  Pt does not want to see anyone else but Dr Joya Gaskins.  I could tell Olivia Mackie, who seems to be good friend w/ the pt, was telling me w/out telling me, the pt needs this appt     Chief Complaint: Pt. Has noticed increase memory loss. "Maybe my medicine needs adjusting." Requesting to see Dr. Joya Gaskins before December. Symptoms: Above Frequency: Weeks ago Pertinent Negatives: Patient denies  Disposition: '[]'$ ED /'[]'$ Urgent Care (no appt availability in office) / '[]'$ Appointment(In office/virtual)/ '[]'$  [place] [facility]/ '[]'$ Home Care/ '[]'$ Refused Recommended Disposition /'[]'$ [place] [facility]/ '[x]'$  Follow-up with PCP Additional Notes: Please advise pt.  Answer Assessment - Initial Assessment Questions 1. MAIN CONCERN OR SYMPTOM:  "What is your main concern right now?" "What questions do you have?" "What's the main symptom you're worried about?" (e.g., confusion, memory loss)     Memory loss, getting worse 2. ONSET:  "When did the symptom start (or worsen)?" (minutes, hours, days, weeks)       Few weeks 3. BETTER-SAME-WORSE: "Are you (the patient) getting better, staying the same, or getting worse compared to the day you (they) were diagnosed or most recent hospital discharge ?"     Same 4. DIAGNOSIS: "Was the dementia diagnosed by a doctor?" If Yes, ask: "When?" (e.g., days, months, years ago)     N/a 5. MEDICINES: "Has there been any change in medicines recently?" (e.g., narcotics, antihistamines, benzodiazepines, etc.)     no 6. OTHER SYMPTOMS: "Are there any other symptoms?" (e.g., fever, cough, pain, falling)     No 7. SUPPORT: Document living circumstances and support (e.g., family, nursing home)     Family  Protocols  used: Dementia Symptoms and Questions-A-AH

## 2022-07-27 NOTE — Telephone Encounter (Signed)
I am ok to double book her before I am out of office starting 08/29/22

## 2022-07-28 ENCOUNTER — Encounter: Payer: Self-pay | Admitting: Critical Care Medicine

## 2022-07-28 NOTE — Telephone Encounter (Signed)
Call returned and appt scheduled.

## 2022-08-01 ENCOUNTER — Telehealth: Payer: Self-pay

## 2022-08-01 NOTE — Telephone Encounter (Signed)
Elizabeth Schwartz, service coordinator for apartment complex for elderly where pt lives was calling to get appointment reminder since pt couldn't remember when appt was. She had made new appt calendar for pt so be easy to read and find but pt still having difficulty with confusion. Elizabeth Schwartz is very concerned about pt and hopes this gets evaluated Wed. She was going to relay appt date and time again to pt.

## 2022-08-03 ENCOUNTER — Ambulatory Visit: Payer: Medicare HMO | Attending: Critical Care Medicine | Admitting: Critical Care Medicine

## 2022-08-03 ENCOUNTER — Encounter: Payer: Self-pay | Admitting: Critical Care Medicine

## 2022-08-03 VITALS — BP 128/76 | HR 101 | Wt 168.0 lb

## 2022-08-03 DIAGNOSIS — R413 Other amnesia: Secondary | ICD-10-CM | POA: Diagnosis not present

## 2022-08-03 DIAGNOSIS — E119 Type 2 diabetes mellitus without complications: Secondary | ICD-10-CM

## 2022-08-03 DIAGNOSIS — G720 Drug-induced myopathy: Secondary | ICD-10-CM

## 2022-08-03 DIAGNOSIS — M810 Age-related osteoporosis without current pathological fracture: Secondary | ICD-10-CM

## 2022-08-03 DIAGNOSIS — J4540 Moderate persistent asthma, uncomplicated: Secondary | ICD-10-CM

## 2022-08-03 DIAGNOSIS — F02818 Dementia in other diseases classified elsewhere, unspecified severity, with other behavioral disturbance: Secondary | ICD-10-CM | POA: Diagnosis not present

## 2022-08-03 DIAGNOSIS — R69 Illness, unspecified: Secondary | ICD-10-CM | POA: Diagnosis not present

## 2022-08-03 DIAGNOSIS — E782 Mixed hyperlipidemia: Secondary | ICD-10-CM

## 2022-08-03 DIAGNOSIS — T466X5A Adverse effect of antihyperlipidemic and antiarteriosclerotic drugs, initial encounter: Secondary | ICD-10-CM | POA: Diagnosis not present

## 2022-08-03 DIAGNOSIS — Z23 Encounter for immunization: Secondary | ICD-10-CM | POA: Diagnosis not present

## 2022-08-03 DIAGNOSIS — I10 Essential (primary) hypertension: Secondary | ICD-10-CM

## 2022-08-03 DIAGNOSIS — L608 Other nail disorders: Secondary | ICD-10-CM

## 2022-08-03 DIAGNOSIS — F209 Schizophrenia, unspecified: Secondary | ICD-10-CM

## 2022-08-03 NOTE — Progress Notes (Signed)
Notes  Established Patient Office Visit  Subjective:  Patient ID: Elizabeth Schwartz, female    DOB: [birth year]  Age: 76 y.o. MRN: [id number]  CC:  Primary care follow-up  HPI 09/15/21 Elizabeth Schwartz presents for primary care follow-up.  She has an issue with loose stools and bowel incontinence.  She is having to wear depends now.  She states there is mucus in the diarrhea.  Note we have worked this up in the past with stools for pathogens and this was negative.  Patient denies any nausea or vomiting or other GI complaints.  Patient also complains of chronic right hip and left knee pain.  She has not had any falls.  Patient also continues with her mental health medications.  She has history of diagnosed schizophrenia and neurocognitive disorder due to multiple etiologies.  Her diabetes is well controlled and on arrival hemoglobin A1c was 6.0.  Blood pressure is good at 117/67.  Patient's not had any flareups of her asthma as well.  Patient did have a Cologuard test and we did not receive the result the patient was told it was negative.  01/25/22 Patient returns in follow-up and has slight increase in memory difficulty otherwise doing reasonably well however on arrival blood pressure initially was elevated but on recheck was 124/84.  She did have a fall in November but has had no falls since that time it was because of a low blood pressure.  Her ramipril was discontinued she is off all blood pressure medicines now.  Blood sugar on arrival was 98.  She brings all her medications with her and we did a complete medication reconciliation at this visit.  The patient needs a Prevnar 20 vaccine and she will receive it.  She is up-to-date speed on all of her other primary care gaps Patient does have some chronic shoulder pain she has been followed by orthopedics plans to call them again she has had injections done  7/18 Patient seen in return follow-up he confirms that she did get severe myopathy with statins and  this is clearly documented in the chart.  She is taking in place of statins fish oil, Zetia, fenofibrate.  She is also trying to follow a healthier diet.  Patient is seen orthopedics recently had injection of the left shoulder with steroids this is improved her left shoulder pain.  She still has pain at the base of the neck.  On arrival blood pressure 124/76.  Her blood sugars at home have ranged 131 170. A1c is good at 5.9.  Patient does complain of some increased blurred vision.  She does have an eye doctor and she will need to see sooner.  Also history of iron deficiency and will need to have her renal function blood levels checked at this visit.  There are no other complaints.  She needs multiple medications refilled.  9/20  Patient seen in return follow-up from July visit on a short-term basis because of complaints of progression in memory loss.  Appears to be worse over the past several months.  She has been on Aricept she cannot tell this made any difference.  She has difficulty organizing her schedule and forgetting appointments.  She is doing her own pill organizer she claims she is putting the pills incorrectly out of the bottles.  She was however when we went over her medications somewhat confused as to what medications were for which purpose.  Note she is divorced from her husband she has no children her siblings are  all deceased she has 2 nieces living in [place] but they did not associate with her.  She does live in a retirement apartment [employer].  There is an Radiographer, therapeutic that organizes events in the community center for the residents.  The apartment manager was concerned about her mental status and did notify us of need for this visit.  Patient did agree to and received her flu vaccine at this visit.  There are no other complaints.  A1c at the last visit was good at 5.9.  Patient was in the ER in December for a fall but hadHas not her fall since that time. Past Medical  History:  Diagnosis Date   Anemia    COPD (chronic obstructive pulmonary disease) (HCC)    Depression    Diabetes mellitus    GERD (gastroesophageal reflux disease)    Hyperlipidemia    Hypertension    Osteoporosis    Schizophrenia, schizo-affective ([place])    SOB (shortness of breath) on exertion    Syncope 06/11/2019    Past Surgical History:  Procedure Laterality Date   BREAST BIOPSY Right 2017   benign   LAPAROSCOPY  1975   ABD pain   VAGINAL HYSTERECTOMY  1975    History reviewed. No pertinent family history.  Social History   Socioeconomic History   Marital status: Single    Spouse name: Not on file   Number of children: Not on file   Years of education: Not on file   Highest education level: Not on file  Occupational History   Not on file  Tobacco Use   Smoking status: Former    Years: 15.00    Types: Cigarettes    Quit date: 02/28/2012    Years since quitting: 10.4   Smokeless tobacco: Former    Types: Nurse, children's Use: Never used  Substance and Sexual Activity   Alcohol use: Yes    Comment: "socially"   Drug use: No   Sexual activity: Not Currently  Other Topics Concern   Not on file  Social History Narrative   Not on file   Social Determinants of Health   Financial Resource Strain: Not on file  Food Insecurity: Not on file  Transportation Needs: Not on file  Physical Activity: Not on file  Stress: Not on file  Social Connections: Not on file  Intimate Partner Violence: Not on file    Outpatient Medications Prior to Visit  Medication Sig Dispense Refill   acetaminophen (TYLENOL) 500 MG tablet Take 500 mg by mouth 2 (two) times daily.     albuterol (VENTOLIN HFA) 108 (90 Base) MCG/ACT inhaler INHALE TWO PUFFS BY MOUTH EVERY 6 HOURS AS NEEDED FOR SHORTNESS OF BREATH 18 g 3   alendronate (FOSAMAX) 70 MG tablet Take 1 tablet (70 mg total) by mouth once a week. 12 tablet 3   calcium carbonate (OS-CAL) 600 MG TABS Take 600 mg by  mouth 2 (two) times daily with a meal.     clozapine (CLOZARIL) 200 MG tablet Take 1 tablet by mouth at bedtime.      diclofenac Sodium (VOLTAREN) 1 % GEL Apply twice a day to affected area 50 g 3   donepezil (ARICEPT) 10 MG tablet Take 1 tablet (10 mg total) by mouth at bedtime. 90 tablet 3   ezetimibe (ZETIA) 10 MG tablet Take 1 tablet (10 mg total) by mouth daily. 90 tablet 3   famotidine (PEPCID) 20 MG tablet Take  1 tablet (20 mg total) by mouth 2 (two) times daily. 180 tablet 1   fenofibrate 160 MG tablet Take 1 tablet (160 mg total) by mouth daily. 60 tablet 6   fish oil-omega-3 fatty acids 1000 MG capsule Take 1 g by mouth every morning.     Iron, Ferrous Sulfate, 325 (65 Fe) MG TABS Take 325 mg by mouth daily with breakfast. 60 tablet 4   metFORMIN (GLUCOPHAGE) 500 MG tablet Take 1 tablet (500 mg total) by mouth 2 (two) times daily with a meal. 180 tablet 2   Multiple Vitamins-Minerals (CENTRUM SILVER ULTRA WOMENS PO) Take 1 tablet by mouth daily.     OneTouch Delica Lancets 32I MISC USE TO TEST BLOOD SUGAR 100 each 1   ONETOUCH VERIO test strip Use to test blood sugar 100 each 2   SYMBICORT 160-4.5 MCG/ACT inhaler INHALE TWO PUFFS INTO THE LUNGS IN THE MORNING AND AT BEDTIME 10.2 g 2   vitamin C (ASCORBIC ACID) 500 MG tablet Take 500 mg by mouth every morning.     VITAMIN D, CHOLECALCIFEROL, PO Take 600 Units by mouth daily.     No facility-administered medications prior to visit.    Allergies  Allergen Reactions   Crestor [Rosuvastatin]     Muscle aches.     ROS Review of Systems  Constitutional: Negative.   HENT: Negative.  Negative for ear pain, postnasal drip, rhinorrhea, sinus pressure, sore throat, trouble swallowing and voice change.   Eyes:  Positive for visual disturbance.  Respiratory: Negative.  Negative for apnea, cough, choking, chest tightness, shortness of breath, wheezing and stridor.   Cardiovascular: Negative.  Negative for chest pain, palpitations and leg  swelling.  Gastrointestinal:  Negative for abdominal distention, abdominal pain, anal bleeding, blood in stool, constipation, diarrhea, nausea, rectal pain and vomiting.  Genitourinary: Negative.   Musculoskeletal:  Positive for arthralgias. Negative for myalgias.       Multiple joint pains  Skin: Negative.  Negative for rash.  Allergic/Immunologic: Negative.  Negative for environmental allergies and food allergies.  Neurological: Negative.  Negative for dizziness, tremors, seizures, syncope, facial asymmetry, speech difficulty, weakness, light-headedness, numbness and headaches.  Hematological: Negative.  Negative for adenopathy. Does not bruise/bleed easily.  Psychiatric/Behavioral:  Positive for confusion and decreased concentration. Negative for agitation, dysphoric mood, hallucinations, self-injury, sleep disturbance and suicidal ideas. The patient is not nervous/anxious and is not hyperactive.       Objective:    Physical Exam Vitals reviewed.  Constitutional:      Appearance: Normal appearance. She is well-developed. She is not diaphoretic.  HENT:     Head: Normocephalic and atraumatic.     Nose: Nose normal. No nasal deformity, septal deviation, mucosal edema or rhinorrhea.     Right Sinus: No maxillary sinus tenderness or frontal sinus tenderness.     Left Sinus: No maxillary sinus tenderness or frontal sinus tenderness.     Mouth/Throat:     Mouth: Mucous membranes are moist.     Pharynx: Oropharynx is clear. No oropharyngeal exudate.  Eyes:     General: No scleral icterus.       Right eye: No discharge.        Left eye: No discharge.     Extraocular Movements: Extraocular movements intact.     Conjunctiva/sclera: Conjunctivae normal.     Pupils: Pupils are equal, round, and reactive to light.     Comments: No visual field cuts  Neck:     Thyroid: No thyromegaly.  Vascular: No carotid bruit or JVD.     Trachea: Trachea normal. No tracheal tenderness or tracheal  deviation.  Cardiovascular:     Rate and Rhythm: Normal rate and regular rhythm.     Chest Wall: PMI is not displaced.     Pulses: Normal pulses. No decreased pulses.     Heart sounds: Normal heart sounds, S1 normal and S2 normal. Heart sounds not distant. No murmur heard.    No systolic murmur is present.     No diastolic murmur is present.     No friction rub. No gallop. No S3 or S4 sounds.  Pulmonary:     Effort: No tachypnea, accessory muscle usage or respiratory distress.     Breath sounds: No stridor. No decreased breath sounds, wheezing, rhonchi or rales.  Chest:     Chest wall: No tenderness.  Abdominal:     General: Bowel sounds are normal. There is no distension.     Palpations: Abdomen is soft. Abdomen is not rigid.     Tenderness: There is no abdominal tenderness. There is no guarding or rebound.  Musculoskeletal:        General: Normal range of motion.     Cervical back: Normal range of motion and neck supple. No edema, erythema or rigidity. No muscular tenderness. Normal range of motion.  Lymphadenopathy:     Head:     Right side of head: No submental or submandibular adenopathy.     Left side of head: No submental or submandibular adenopathy.     Cervical: No cervical adenopathy.  Skin:    General: Skin is warm and dry.     Coloration: Skin is not pale.     Findings: No rash.     Nails: There is no clubbing.  Neurological:     General: No focal deficit present.     Mental Status: She is alert and oriented to person, place, and time.     Cranial Nerves: Cranial nerves 2-12 are intact. No cranial nerve deficit.     Sensory: Sensation is intact. No sensory deficit.     Motor: Motor function is intact. No weakness.     Coordination: Coordination is intact. Coordination normal.     Gait: Gait is intact. Gait normal.     Deep Tendon Reflexes: Reflexes normal.  Psychiatric:        Attention and Perception: Attention and perception normal.        Mood and Affect: Mood  and affect normal.        Speech: Speech normal.        Behavior: Behavior normal.        Thought Content: Thought content normal.        Cognition and Memory: Cognition is impaired. Memory is impaired. She exhibits impaired recent memory and impaired remote memory.        Judgment: Judgment normal.     BP 128/76   Pulse (!) 101   Wt 168 lb (76.2 kg)   SpO2 95%   BMI 29.76 kg/m  Wt Readings from Last 3 Encounters:  08/03/22 168 lb (76.2 kg)  05/31/22 165 lb 11.2 oz (75.2 kg)  01/25/22 161 lb 6.4 oz (73.2 kg)     Health Maintenance Due  Topic Date Due   COVID-19 Vaccine (6 - Pfizer risk series) 12/28/2021      There are no preventive care reminders to display for this patient.  Lab Results  Component Value Date   TSH  0.816 08/21/2016   Lab Results  Component Value Date   WBC 4.9 05/31/2022   HGB 10.4 (L) 05/31/2022   HCT 31.5 (L) 05/31/2022   MCV 90 05/31/2022   PLT 304 05/31/2022   Lab Results  Component Value Date   NA 141 05/31/2022   K 4.5 05/31/2022   CO2 21 05/31/2022   GLUCOSE 119 (H) 05/31/2022   BUN 36 (H) 05/31/2022   CREATININE 1.33 (H) 05/31/2022   BILITOT <0.2 09/15/2021   ALKPHOS 40 (L) 09/15/2021   AST 27 09/15/2021   ALT 28 09/15/2021   PROT 6.5 09/15/2021   ALBUMIN 4.7 09/15/2021   CALCIUM 9.9 05/31/2022   ANIONGAP 15 03/25/2019   EGFR 42 (L) 05/31/2022   Lab Results  Component Value Date   CHOL 188 05/31/2022   Lab Results  Component Value Date   HDL 54 05/31/2022   Lab Results  Component Value Date   LDLCALC 94 05/31/2022   Lab Results  Component Value Date   TRIG 239 (H) 05/31/2022   Lab Results  Component Value Date   CHOLHDL 3.5 05/31/2022   Lab Results  Component Value Date   HGBA1C 5.9 05/31/2022      Assessment & Plan:   Problem List Items Addressed This Visit       Cardiovascular and Mediastinum   Hypertension    Hypertension well controlled at this time no changes made        Respiratory    Asthma, moderate persistent    Continue with Symbicort        Endocrine   Diabetes type 2, controlled (HCC)    A1c 5.9 continue with metformin        Nervous and Auditory   Major neurocognitive disorder due to multiple etiologies with behavioral disturbance (HCC)    Cognitive dysfunction appears to be worsening note when she fell in December CT of the head already been performed showing mild microvascular changes may benefit from an MRI and further neuropsych testing  Plan referral to neurology      Relevant Orders   Ambulatory referral to Neurology     Musculoskeletal and Integument   Osteoporosis    Continue Citracal Centrum Silver and alendronate      Pincer nail deformity    Has follow-up with podiatry      Statin myopathy    Cannot take statins is taking fenofibrate Zetia and fish oil        Other   Schizophrenia ([place])    As per mental health      Mixed hyperlipidemia    Continue with Zetia  fish oil and fenofibrate      Memory loss - Primary    Memory loss over and above that of the patient's base neuropsych condition  Plan to refer to neurology  Partner with nurse case management it appears where she is staying is the safest place for now      Relevant Orders   Ambulatory referral to Neurology   Other Visit Diagnoses     Need for immunization against influenza       Relevant Orders   Flu Vaccine QUAD High Dose(Fluad) (Completed)     No orders of the defined types were placed in this encounter. 38 minutes spent complex decision making high Flu vaccine given  Follow-up: Return in about 3 months (around 11/02/2022) for diabetes, memory loss.    Asencion Noble, MD

## 2022-08-03 NOTE — Assessment & Plan Note (Signed)
A1c 5.9 continue with metformin

## 2022-08-03 NOTE — Assessment & Plan Note (Signed)
Cognitive dysfunction appears to be worsening note when she fell in December CT of the head already been performed showing mild microvascular changes may benefit from an MRI and further neuropsych testing  Plan referral to neurology

## 2022-08-03 NOTE — Assessment & Plan Note (Signed)
Hypertension well controlled at this time no changes made

## 2022-08-03 NOTE — Assessment & Plan Note (Addendum)
Continue with Zetia  fish oil and fenofibrate

## 2022-08-03 NOTE — Assessment & Plan Note (Signed)
Cannot take statins is taking fenofibrate Zetia and fish oil

## 2022-08-03 NOTE — Assessment & Plan Note (Signed)
Continue Citracal Centrum Silver and alendronate

## 2022-08-03 NOTE — Assessment & Plan Note (Signed)
Continue with Symbicort 

## 2022-08-03 NOTE — Assessment & Plan Note (Signed)
Has follow-up with podiatry

## 2022-08-03 NOTE — Patient Instructions (Addendum)
Flu vaccine was given  Remember that your fish oil, Zetia which is ezetimibe , and fenofibrate are all for your cholesterol  Metformin is for your diabetes  Centrum Silver alendronate and Citracal are all for your bone density  Clozapine is for your mental health  Aricept is for your memory  Famotidine is for your stomach   Referral to neurology be made for your memory concerns  Return to Dr. Joya Gaskins 3 months

## 2022-08-03 NOTE — Assessment & Plan Note (Signed)
Memory loss over and above that of the patient's base neuropsych condition  Plan to refer to neurology  Partner with nurse case management it appears where she is staying is the safest place for now

## 2022-08-03 NOTE — Assessment & Plan Note (Signed)
As per mental health

## 2022-08-04 ENCOUNTER — Other Ambulatory Visit: Payer: Self-pay | Admitting: Critical Care Medicine

## 2022-08-11 ENCOUNTER — Ambulatory Visit: Payer: Medicare HMO | Admitting: Physician Assistant

## 2022-08-11 ENCOUNTER — Other Ambulatory Visit (INDEPENDENT_AMBULATORY_CARE_PROVIDER_SITE_OTHER): Payer: Medicare HMO

## 2022-08-11 ENCOUNTER — Encounter: Payer: Self-pay | Admitting: Physician Assistant

## 2022-08-11 VITALS — BP 120/84 | HR 78 | Resp 18 | Wt 168.0 lb

## 2022-08-11 DIAGNOSIS — R69 Illness, unspecified: Secondary | ICD-10-CM | POA: Diagnosis not present

## 2022-08-11 DIAGNOSIS — G309 Alzheimer's disease, unspecified: Secondary | ICD-10-CM | POA: Insufficient documentation

## 2022-08-11 DIAGNOSIS — R413 Other amnesia: Secondary | ICD-10-CM

## 2022-08-11 DIAGNOSIS — F0280 Dementia in other diseases classified elsewhere without behavioral disturbance: Secondary | ICD-10-CM

## 2022-08-11 DIAGNOSIS — F3132 Bipolar disorder, current episode depressed, moderate: Secondary | ICD-10-CM | POA: Diagnosis not present

## 2022-08-11 LAB — TSH: TSH: 1.9 u[IU]/mL (ref 0.35–5.50)

## 2022-08-11 LAB — VITAMIN B12: Vitamin B-12: 429 pg/mL (ref 211–911)

## 2022-08-11 NOTE — Progress Notes (Signed)
Blood tests are normal, thanks

## 2022-08-11 NOTE — Progress Notes (Signed)
Assessment/Plan:    The patient is seen in neurologic consultation at the request of [name], MD for the evaluation of memory.  Elizabeth Schwartz is a very pleasant 76 y.o. year old RH female with  a history of hypertension, hyperlipidemia, schizophrenia, DM2, anemia, COPD, depression, arthritis -osteoporosis, asthma, chronic diarrhea, seen today for evaluation of memory loss.  Prior CT of the head in December 2022 personally reviewed is remarkable for mild microvascular changes, some atrophy is seen. Dementia due to Alzheimer's Disease is suspected.  MoCA today is 12/30 .   Dementia due to Alzheimer's disease MRI brain without contrast to assess for underlying structural abnormality and assess vascular load  Check B12, TSH Recommend discontinuing Aricept due to diarrhea, will consider start another antidementia medication such as memantine after Aricept is out of her system. Continue to control mood as per PCP Recommend good control of cardiovascular risk factors.  Recommend baby aspirin daily Patient lives alone, recommend LTF for safety  Monitor driving [place] up  in 1 month to discuss the results of MRI brain   Subjective:    The patient is here alone   How long did patient have memory difficulties? " Several months ago" Patient was initially placed on Aricept, and "cannot tell any difference in the memory ".  She has difficulty organizing her schedule, and forgets appointments, she has decreased concentration. She also has difficulty with TM  Patient lives with:  Patient lives alone in a retirement community, lives alone.  She is divorced, she is estranged from her family, has no children. repeats oneself? Denies  Disoriented when walking into a room?  Patient denies   Leaving objects in unusual places?  Patient denies   Ambulates  with difficulty?   History of chronic right hip and left pain due to arthritis, which limited mobility. Does not use the walker often although she  is at risk for falls   Recent falls?  Patient denies.  Last fall was on December 2022, with neck pain without fractures, no loss of consciousness Any head injuries?  Patient denies   History of seizures?   Patient denies   Wandering behavior?  Patient denies   Patient drives?   Denies any issues driving such as getting lost.   Any mood changes such irritability agitation?  Patient has history of schizophrenia controlled by PCP, has not seen [facility] in a long time Any history of depression?: Endorsed  Hallucinations?  Patient denies   Paranoia?  Patient denies   Patient reports that sleeps well without vivid dreams, REM behavior or sleepwalking    History of sleep apnea?  Patient denies   Any hygiene concerns?  Patient denies   Independent of bathing and dressing?  Endorsed  Does the patient needs help with medications?  Patient admits to having been placing the pills incorrectly on the pillbox.  Sometimes she gets confused of what medications to take or what they are for. In today's visit she thought that the memory medication was  Ramipril, which it is unclear if this has been discontinued and replaced by other agent.  Who is in charge of the finances?  Patient is in charge, she denies any issues  Any changes in appetite?  Patient denies   Patient have trouble swallowing? Patient denies   Does the patient cook?  Patient denies   Any kitchen accidents such as leaving the stove on? Patient denies   Any headaches?  Patient denies   Double vision?  Patient denies   Any focal numbness or tingling?  Patient denies   Chronic back pain Patient denies   Unilateral weakness?  Patient denies   Any tremors?  Patient denies   Any history of anosmia? Denies  Any incontinence of urine? Endorsed, wears Depends Any bowel dysfunction?   History of loose stools, follows with GI History of heavy alcohol intake?  Patient denies   History of heavy tobacco use?  Patient denies   Family history of dementia?   Patient denies    Allergies  Allergen Reactions   Crestor [Rosuvastatin]     Muscle aches.     Current Outpatient Medications  Medication Instructions   acetaminophen (TYLENOL) 500 mg, Oral, 2 times daily   albuterol (VENTOLIN HFA) 108 (90 Base) MCG/ACT inhaler INHALE TWO PUFFS BY MOUTH EVERY 6 HOURS AS NEEDED FOR SHORTNESS OF BREATH   alendronate (FOSAMAX) 70 mg, Oral, Weekly   ascorbic acid (VITAMIN C) 500 mg, Oral, Every morning   calcium carbonate (OS-CAL) 600 mg, Oral, 2 times daily with meals,     clozapine (CLOZARIL) 200 MG tablet 1 tablet, Oral, Daily at bedtime   diclofenac Sodium (VOLTAREN) 1 % GEL Apply twice a day to affected area   donepezil (ARICEPT) 10 mg, Oral, Daily at bedtime   ezetimibe (ZETIA) 10 mg, Oral, Daily   famotidine (PEPCID) 20 mg, Oral, 2 times daily   fenofibrate 160 mg, Oral, Daily   fish oil-omega-3 fatty acids 1 g, Oral, Every morning   Iron (Ferrous Sulfate) 325 mg, Oral, Daily with breakfast   metFORMIN (GLUCOPHAGE) 500 mg, Oral, 2 times daily with meals   Multiple Vitamins-Minerals (CENTRUM SILVER ULTRA WOMENS PO) 1 tablet, Oral, Daily   OneTouch Delica Lancets 94R MISC USE TO TEST BLOOD SUGAR   ONETOUCH VERIO test strip USE TO TEST BLOOD SUGAR   SYMBICORT 160-4.5 MCG/ACT inhaler INHALE TWO PUFFS INTO THE LUNGS IN THE MORNING AND AT BEDTIME   VITAMIN D, CHOLECALCIFEROL, PO 600 Units, Oral, Daily,       VITALS:   Vitals:   08/11/22 1051  BP: 120/84  Pulse: 78  Resp: 18  SpO2: 98%  Weight: 168 lb (76.2 kg)      05/31/2022    9:58 AM 01/25/2022   10:12 AM 09/15/2021    9:24 AM 03/22/2021    9:20 AM 09/01/2020    2:02 PM  Depression screen PHQ 2/9  Decreased Interest 0 0 0 0 0  Down, Depressed, Hopeless 0 0 0 0 0  PHQ - 2 Score 0 0 0 0 0  Altered sleeping    0   Tired, decreased energy    0   Change in appetite    0   Feeling bad or failure about yourself     0   Trouble concentrating    0   Moving slowly or fidgety/restless    0    Suicidal thoughts    0   PHQ-9 Score    0     PHYSICAL EXAM   HEENT:  Normocephalic, atraumatic. The mucous membranes are moist. The superficial temporal arteries are without ropiness or tenderness. Cardiovascular: Regular rate and rhythm. Lungs: Clear to auscultation bilaterally. Neck: There are no carotid bruits noted bilaterally.  NEUROLOGICAL:    08/11/2022   10:00 AM  Montreal Cognitive Assessment   Visuospatial/ Executive (0/5) 1  Naming (0/3) 3  Attention: Read list of digits (0/2) 1  Attention: Read list of letters (0/1) 1  Attention: Serial 7 subtraction starting at 100 (0/3) 1  Language: Repeat phrase (0/2) 1  Language : Fluency (0/1) 0  Abstraction (0/2) 1  Delayed Recall (0/5) 0  Orientation (0/6) 3  Total 12  Adjusted Score (based on education) 12        No data to display           Orientation:  Alert and oriented to person, place and not to date. No aphasia or dysarthria. Fund of knowledge is reduced. Recent and remote memory impaired.  Attention and concentration are reduced.  Able to name objects and repeat phrases. Delayed recall 0/5 Cranial nerves: There is good facial symmetry. Extraocular muscles are intact and visual fields are full to confrontational testing. Speech is fluent and clear. Soft palate rises symmetrically and there is no tongue deviation. Hearing is intact to conversational tone. Tone: Tone is good throughout. Sensation: Sensation is intact to light touch and pinprick throughout. Vibration is intact at the bilateral big toe.There is no extinction with double simultaneous stimulation. There is no sensory dermatomal level identified. Coordination: The patient has no difficulty with RAM's or FNF bilaterally. Normal finger to nose  Motor: Strength is 5/5 in the bilateral upper and lower extremities. There is no pronator drift. There are no fasciculations noted. DTR's: Deep tendon reflexes are1/4 at the bilateral biceps, triceps,  brachioradialis, patella and achilles.  Plantar responses are downgoing bilaterally. Gait and Station: The patient is able to ambulate without difficulty.The patient is able to heel toe walk without any difficulty.The patient is able to ambulate in a tandem fashion. The patient is able to stand in the Romberg position.     Thank you for allowing [place] the opportunity to participate in the care of this nice patient. Please do not hesitate to contact us for any questions or concerns.   Total time spent on today's visit was 60 minutes dedicated to this patient today, preparing to see patient, examining the patient, ordering tests and/or medications and counseling the patient, documenting clinical information in the EHR or other health record, independently interpreting results and communicating results to the patient/family, discussing treatment and goals, answering patient's questions and coordinating care.  Cc:  [name], MD  Sharene Butters 08/11/2022 10:54 AM

## 2022-08-11 NOTE — Patient Instructions (Addendum)
It was a pleasure to see you today at our office.   Recommendations:   MRI of the brain, the radiology office will call you to arrange you appointment Check labs today Follow up in 1 month Recommend discontinuing donepezil  due to diarrhea   Whom to call:  Memory  decline, memory medications: Call our office [phone]   For psychiatric meds, mood meds: Please have your primary care physician manage these medications.   Counseling regarding caregiver distress, including caregiver depression, anxiety and issues regarding community resources, adult day care programs, adult living facilities, or memory care questions:   Feel free to contact [place], Social Worker at [phone]   For assessment of decision of mental capacity and competency:  Call Dr. Anthoney Harada, geriatric psychiatrist at [phone]  For guidance in geriatric dementia issues please call [facility] [phone]  For guidance regarding [facility] and if placement were needed at the facility, contact Arnell Asal, Social Worker tel: [phone]  If you have any severe symptoms of a stroke, or other severe issues such as confusion,severe chills or fever, etc call 911 or go to the ER as you may need to be evaluated further   Feel free to visit Facebook page " [email or web address]" for tips of how to care for people with memory problems.       RECOMMENDATIONS FOR ALL PATIENTS WITH MEMORY PROBLEMS: 1. Continue to exercise (Recommend 30 minutes of walking everyday, or 3 hours every week) 2. Increase social interactions - continue going to [place] and enjoy social gatherings with friends and family 3. Eat healthy, avoid fried foods and eat more fruits and vegetables 4. Maintain adequate blood pressure, blood sugar, and blood cholesterol level. Reducing the risk of stroke and cardiovascular disease also helps promoting better memory. 5. Avoid stressful situations. Live a simple life  and avoid aggravations. Organize your time and prepare for the next day in anticipation. 6. Sleep well, avoid any interruptions of sleep and avoid any distractions in the bedroom that may interfere with adequate sleep quality 7. Avoid sugar, avoid sweets as there is a strong link between excessive sugar intake, diabetes, and cognitive impairment We discussed the Mediterranean diet, which has been shown to help patients reduce the risk of progressive memory disorders and reduces cardiovascular risk. This includes eating fish, eat fruits and green leafy vegetables, nuts like almonds and hazelnuts, walnuts, and also use olive oil. Avoid fast foods and fried foods as much as possible. Avoid sweets and sugar as sugar use has been linked to worsening of memory function.  There is always a concern of gradual progression of memory problems. If this is the case, then we may need to adjust level of care according to patient needs. Support, both to the patient and caregiver, should then be put into place.      You have been referred for a neuropsychological evaluation (i.e., evaluation of memory and thinking abilities). Please bring someone with you to this appointment if possible, as it is helpful for the doctor to hear from both you and another adult who knows you well. Please bring eyeglasses and hearing aids if you wear them.    The evaluation will take approximately 3 hours and has two parts:   The first part is a clinical interview with the neuropsychologist (Dr. Melvyn Novas or Dr. Nicole Kindred). During the interview, the neuropsychologist will speak with you and the individual you brought to the appointment.    The second part of  the evaluation is testing with the doctor's technician Hinton Dyer or Maudie Mercury). During the testing, the technician will ask you to remember different types of material, solve problems, and answer some questionnaires. Your family member will not be present for this portion of the evaluation.    Please note: We must reserve several hours of the neuropsychologist's time and the psychometrician's time for your evaluation appointment. As such, there is a No-Show fee of $100. If you are unable to attend any of your appointments, please contact our office as soon as possible to reschedule.    FALL PRECAUTIONS: Be cautious when walking. Scan the area for obstacles that may increase the risk of trips and falls. When getting up in the mornings, sit up at the edge of the bed for a few minutes before getting out of bed. Consider elevating the bed at the head end to avoid drop of blood pressure when getting up. Walk always in a well-lit room (use night lights in the walls). Avoid area rugs or power cords from appliances in the middle of the walkways. Use a walker or a cane if necessary and consider physical therapy for balance exercise. Get your eyesight checked regularly.  FINANCIAL OVERSIGHT: Supervision, especially oversight when making financial decisions or transactions is also recommended.  HOME SAFETY: Consider the safety of the kitchen when operating appliances like stoves, microwave oven, and blender. Consider having supervision and share cooking responsibilities until no longer able to participate in those. Accidents with firearms and other hazards in the house should be identified and addressed as well.   ABILITY TO BE LEFT ALONE: If patient is unable to contact 911 operator, consider using LifeLine, or when the need is there, arrange for someone to stay with patients. Smoking is a fire hazard, consider supervision or cessation. Risk of wandering should be assessed by caregiver and if detected at any point, supervision and safe proof recommendations should be instituted.  MEDICATION SUPERVISION: Inability to self-administer medication needs to be constantly addressed. Implement a mechanism to ensure safe administration of the medications.   DRIVING: Regarding driving, in patients with  progressive memory problems, driving will be impaired. We advise to have someone else do the driving if trouble finding directions or if minor accidents are reported. Independent driving assessment is available to determine safety of driving.   If you are interested in the driving assessment, you can contact the following:  [employer] in [place]  [place] [phone] or [phone]    Reader refers to food and lifestyle choices that are based on the traditions of countries located on the [employer]. This way of eating has been shown to help prevent certain conditions and improve outcomes for people who have chronic diseases, like kidney disease and heart disease. What are tips for following this plan? Lifestyle  Cook and eat meals together with your family, when possible. Drink enough fluid to keep your urine clear or pale yellow. Be physically active every day. This includes: Aerobic exercise like running or swimming. Leisure activities like gardening, walking, or housework. Get 7-8 hours of sleep each night. If recommended by your health care provider, drink red wine in moderation. This means 1 glass a day for nonpregnant women and 2 glasses a day for men. A glass of wine equals 5 oz (150 mL). Reading food labels  Check the serving size of packaged foods. For foods such as rice  and pasta, the serving size refers to the amount of cooked product, not dry. Check the total fat in packaged foods. Avoid foods that have saturated fat or trans fats. Check the ingredients list for added sugars, such as corn syrup. Shopping  At the grocery store, buy most of your food from the areas near the walls of the store. This includes: Fresh fruits and vegetables (produce). Grains, beans, nuts, and seeds. Some of these may be available in  unpackaged forms or large amounts (in bulk). Fresh seafood. Poultry and eggs. Low-fat dairy products. Buy whole ingredients instead of prepackaged foods. Buy fresh fruits and vegetables in-season from local farmers markets. Buy frozen fruits and vegetables in resealable bags. If you do not have access to quality fresh seafood, buy precooked frozen shrimp or canned fish, such as tuna, salmon, or sardines. Buy small amounts of raw or cooked vegetables, salads, or olives from the deli or salad bar at your store. Stock your pantry so you always have certain foods on hand, such as olive oil, canned tuna, canned tomatoes, rice, pasta, and beans. Cooking  Cook foods with extra-virgin olive oil instead of using butter or other vegetable oils. Have meat as a side dish, and have vegetables or grains as your main dish. This means having meat in small portions or adding small amounts of meat to foods like pasta or stew. Use beans or vegetables instead of meat in common dishes like chili or lasagna. Experiment with different cooking methods. Try roasting or broiling vegetables instead of steaming or sauteing them. Add frozen vegetables to soups, stews, pasta, or rice. Add nuts or seeds for added healthy fat at each meal. You can add these to yogurt, salads, or vegetable dishes. Marinate fish or vegetables using olive oil, lemon juice, garlic, and fresh herbs. Meal planning  Plan to eat 1 vegetarian meal one day each week. Try to work up to 2 vegetarian meals, if possible. Eat seafood 2 or more times a week. Have healthy snacks readily available, such as: Vegetable sticks with hummus. Greek yogurt. Fruit and nut trail mix. Eat balanced meals throughout the week. This includes: Fruit: 2-3 servings a day Vegetables: 4-5 servings a day Low-fat dairy: 2 servings a day Fish, poultry, or lean meat: 1 serving a day Beans and legumes: 2 or more servings a week Nuts and seeds: 1-2 servings a day Whole  grains: 6-8 servings a day Extra-virgin olive oil: 3-4 servings a day Limit red meat and sweets to only a few servings a month What are my food choices? Mediterranean diet Recommended Grains: Whole-grain pasta. Brown rice. Bulgar wheat. Polenta. Couscous. Whole-wheat bread. [place]. Vegetables: Artichokes. Beets. Broccoli. Cabbage. Carrots. Eggplant. Green beans. Chard. Kale. Spinach. Onions. Leeks. Peas. Squash. Tomatoes. Peppers. Radishes. Fruits: Apples. Apricots. Avocado. Berries. Bananas. Cherries. Dates. Figs. Grapes. Lemons. Melon. Oranges. Peaches. Plums. Pomegranate. Meats and other protein foods: Beans. Almonds. Sunflower seeds. Pine nuts. Peanuts. [place]. Salmon. Scallops. Shrimp. [place]. Tilapia. Clams. Oysters. Eggs. Dairy: Low-fat milk. Cheese. Greek yogurt. Beverages: Water. Red wine. Herbal tea. Fats and oils: Extra virgin olive oil. Avocado oil. Grape seed oil. Sweets and desserts: [place] yogurt with honey. Baked apples. Poached pears. Trail mix. Seasoning and other foods: Basil. Cilantro. Coriander. Cumin. Mint. Parsley. Sage. Rosemary. Tarragon. Garlic. Oregano. Thyme. Pepper. Balsalmic vinegar. Tahini. Hummus. Tomato sauce. Olives. Mushrooms. Limit these Grains: Prepackaged pasta or rice dishes. Prepackaged cereal with added sugar. Vegetables: Deep fried potatoes (french fries). Fruits: Fruit canned in syrup. Meats and other  protein foods: Beef. Pork. Lamb. Poultry with skin. Hot dogs. Berniece Salines. Dairy: Ice cream. Sour cream. Whole milk. Beverages: Juice. Sugar-sweetened soft drinks. Beer. Liquor and spirits. Fats and oils: Butter. Canola oil. Vegetable oil. Beef fat (tallow). Lard. Sweets and desserts: Cookies. Cakes. Pies. Candy. Seasoning and other foods: Mayonnaise. Premade sauces and marinades. The items listed may not be a complete list. Talk with your dietitian about what dietary choices are right for you. Summary The Mediterranean diet includes both food and  lifestyle choices. Eat a variety of fresh fruits and vegetables, beans, nuts, seeds, and whole grains. Limit the amount of red meat and sweets that you eat. Talk with your health care provider about whether it is safe for you to drink red wine in moderation. This means 1 glass a day for nonpregnant women and 2 glasses a day for men. A glass of wine equals 5 oz (150 mL). This information is not intended to replace advice given to you by your health care provider. Make sure you discuss any questions you have with your health care provider. Document Released: 06/23/2016 Document Revised: 07/26/2016 Document Reviewed: 06/23/2016 Elsevier Interactive Patient Education  2017 [employer].   We have sent a referral to [facility] for your MRI and they will call you directly to schedule your appointment. They are located at [place]. If you need to contact them directly please call [phone].  Your provider has requested that you have labwork completed today. Please go to [facility] ([id number]) on the second floor of this building before leaving the office today. You do not need to check in. If you are not called within 15 minutes please check with the front desk.

## 2022-08-12 ENCOUNTER — Telehealth: Payer: Self-pay | Admitting: Physician Assistant

## 2022-08-12 NOTE — Telephone Encounter (Signed)
Pt said she is returning a call to someone, maybe its about labs

## 2022-08-12 NOTE — Telephone Encounter (Signed)
Advised of labs 

## 2022-08-16 ENCOUNTER — Ambulatory Visit: Payer: Medicare HMO | Admitting: Physician Assistant

## 2022-08-19 ENCOUNTER — Ambulatory Visit: Payer: Medicare HMO

## 2022-08-19 ENCOUNTER — Ambulatory Visit
Admission: RE | Admit: 2022-08-19 | Discharge: 2022-08-19 | Disposition: A | Discharge status: Home / Self Care | Payer: Medicare HMO | Source: Ambulatory Visit | Attending: Critical Care Medicine | Admitting: Critical Care Medicine

## 2022-08-19 DIAGNOSIS — Z1231 Encounter for screening mammogram for malignant neoplasm of breast: Secondary | ICD-10-CM | POA: Diagnosis not present

## 2022-08-22 NOTE — Progress Notes (Signed)
Let pt know mammogram is negative recheck in one year

## 2022-08-23 ENCOUNTER — Telehealth: Payer: Self-pay

## 2022-08-23 NOTE — Telephone Encounter (Signed)
Pt was called and vm was left, Information has been sent to nurse pool.   

## 2022-08-23 NOTE — Telephone Encounter (Signed)
-----   Message from [name], MD sent at 08/22/2022 12:19 PM EDT ----- Let pt know mammogram is negative recheck in one year

## 2022-08-25 ENCOUNTER — Ambulatory Visit
Admission: RE | Admit: 2022-08-25 | Discharge: 2022-08-25 | Disposition: A | Discharge status: Home / Self Care | Payer: Medicare HMO | Source: Ambulatory Visit | Attending: Physician Assistant | Admitting: Physician Assistant

## 2022-08-25 DIAGNOSIS — G319 Degenerative disease of nervous system, unspecified: Secondary | ICD-10-CM | POA: Diagnosis not present

## 2022-08-25 DIAGNOSIS — R413 Other amnesia: Secondary | ICD-10-CM | POA: Diagnosis not present

## 2022-08-25 DIAGNOSIS — I6782 Cerebral ischemia: Secondary | ICD-10-CM | POA: Diagnosis not present

## 2022-08-28 NOTE — Progress Notes (Signed)
MRI brain shows moderate changes in the arteries of the brain and mild brain atrophy. Which may contribute to the memory changes, thanks

## 2022-08-30 ENCOUNTER — Telehealth: Payer: Self-pay | Admitting: Anesthesiology

## 2022-08-30 NOTE — Telephone Encounter (Signed)
Patient requesting call back regarding MRI results.

## 2022-08-31 NOTE — Telephone Encounter (Signed)
Patient advised of Mri results, voiced understanding and thanked me for calling

## 2022-09-16 ENCOUNTER — Ambulatory Visit: Payer: Medicare HMO | Admitting: Physician Assistant

## 2022-09-16 NOTE — Progress Notes (Incomplete)
Assessment/Plan:   Dementia due to Alzheimer's disease  Elizabeth Schwartz is a very pleasant 76 y.o. RH female  with  a history of hypertension, hyperlipidemia, schizophrenia, DM2, anemia, COPD, depression, arthritis -osteoporosis, asthma, chronic diarrhea seen today in follow up to discuss the 08/2022  MRI of the brain results. These were personally reviewed, remarkable for  Moderate chronic small vessel ischemic changes within the cerebral white matter and mild cerebral atrophy. No acute abnormalities seen. She was initially on donepezil 10 mg daily but due to chronic diarrhea, this was discontinued during prior visit.        Follow up in 6  months. Patient lives alone, recommend ALF for safety Continue to control mood as per PCP Recommend good control of cardiovascular risk factors.    Start Memantine 5 mg: Take 1 tablet ('5mg'$  at night) for 2 weeks, then increase to 1 tablet (5 mg) twice a day.        Subjective:    This patient is accompanied in the office by  who supplements the history.  Previous records as well as any outside records available were reviewed prior to todays visit.    Any changes in memory since last visit? Tolerating meds?  Initial visit 08/11/2022 How long did patient have memory difficulties? " Several months ago" Patient was initially placed on Aricept, and "cannot tell any difference in the memory ".  She has difficulty organizing her schedule, and forgets appointments, she has decreased concentration. She also has difficulty with TM  Patient lives with:  Patient lives alone in a retirement community, lives alone.  She is divorced, she is estranged from her family, has no children. repeats oneself? Denies  Disoriented when walking into a room?  Patient denies   Leaving objects in unusual places?  Patient denies   Ambulates  with difficulty?   History of chronic right hip and left pain due to arthritis, which limited mobility. Does not use the walker often  although she is at risk for falls   Recent falls?  Patient denies.  Last fall was on December 2022, with neck pain without fractures, no loss of consciousness Any head injuries?  Patient denies   History of seizures?   Patient denies   Wandering behavior?  Patient denies   Patient drives?   Denies any issues driving such as getting lost.   Any mood changes such irritability agitation?  Patient has history of schizophrenia controlled by PCP, has not seen [facility] in a long time Any history of depression?: Endorsed  Hallucinations?  Patient denies   Paranoia?  Patient denies   Patient reports that sleeps well without vivid dreams, REM behavior or sleepwalking    History of sleep apnea?  Patient denies   Any hygiene concerns?  Patient denies   Independent of bathing and dressing?  Endorsed  Does the patient needs help with medications?  Patient admits to having been placing the pills incorrectly on the pillbox.  Sometimes she gets confused of what medications to take or what they are for. In today's visit she thought that the memory medication was  Ramipril, which it is unclear if this has been discontinued and replaced by other agent.  Who is in charge of the finances?  Patient is in charge, she denies any issues  Any changes in appetite?  Patient denies   Patient have trouble swallowing? Patient denies   Does the patient cook?  Patient denies   Any kitchen accidents such as leaving the  stove on? Patient denies   Any headaches?  Patient denies   Double vision? Patient denies   Any focal numbness or tingling?  Patient denies   Chronic back pain Patient denies   Unilateral weakness?  Patient denies   Any tremors?  Patient denies   Any history of anosmia? Denies  Any incontinence of urine? Endorsed, wears Depends Any bowel dysfunction?   History of loose stools, follows with GI History of heavy alcohol intake?  Patient denies   History of heavy tobacco use?  Patient denies   Family history of  dementia?  Patient denies   Personally reviewed 08/26/22 MRI brain was remarkable for   Moderate chronic small vessel ischemic changes within the cerebral white matter and mild cerebral atrophy. No acute abnormalities seen    Labs 07/2022 TSH 1.9, B12 429  CURRENT MEDICATIONS:  Outpatient Encounter Medications as of 09/16/2022  Medication Sig   acetaminophen (TYLENOL) 500 MG tablet Take 500 mg by mouth 2 (two) times daily.   albuterol (VENTOLIN HFA) 108 (90 Base) MCG/ACT inhaler INHALE TWO PUFFS BY MOUTH EVERY 6 HOURS AS NEEDED FOR SHORTNESS OF BREATH   alendronate (FOSAMAX) 70 MG tablet Take 1 tablet (70 mg total) by mouth once a week.   calcium carbonate (OS-CAL) 600 MG TABS Take 600 mg by mouth 2 (two) times daily with a meal.   clozapine (CLOZARIL) 200 MG tablet Take 1 tablet by mouth at bedtime.    diclofenac Sodium (VOLTAREN) 1 % GEL Apply twice a day to affected area   donepezil (ARICEPT) 10 MG tablet Take 1 tablet (10 mg total) by mouth at bedtime.   ezetimibe (ZETIA) 10 MG tablet Take 1 tablet (10 mg total) by mouth daily.   famotidine (PEPCID) 20 MG tablet Take 1 tablet (20 mg total) by mouth 2 (two) times daily.   fenofibrate 160 MG tablet Take 1 tablet (160 mg total) by mouth daily.   fish oil-omega-3 fatty acids 1000 MG capsule Take 1 g by mouth every morning.   Iron, Ferrous Sulfate, 325 (65 Fe) MG TABS Take 325 mg by mouth daily with breakfast.   metFORMIN (GLUCOPHAGE) 500 MG tablet Take 1 tablet (500 mg total) by mouth 2 (two) times daily with a meal.   Multiple Vitamins-Minerals (CENTRUM SILVER ULTRA WOMENS PO) Take 1 tablet by mouth daily.   OneTouch Delica Lancets 78M MISC USE TO TEST BLOOD SUGAR   ONETOUCH VERIO test strip USE TO TEST BLOOD SUGAR   SYMBICORT 160-4.5 MCG/ACT inhaler INHALE TWO PUFFS INTO THE LUNGS IN THE MORNING AND AT BEDTIME   vitamin C (ASCORBIC ACID) 500 MG tablet Take 500 mg by mouth every morning.   VITAMIN D, CHOLECALCIFEROL, PO Take 600 Units by  mouth daily.   No facility-administered encounter medications on file as of 09/16/2022.        No data to display            08/11/2022   10:00 AM  Montreal Cognitive Assessment   Visuospatial/ Executive (0/5) 1  Naming (0/3) 3  Attention: Read list of digits (0/2) 1  Attention: Read list of letters (0/1) 1  Attention: Serial 7 subtraction starting at 100 (0/3) 1  Language: Repeat phrase (0/2) 1  Language : Fluency (0/1) 0  Abstraction (0/2) 1  Delayed Recall (0/5) 0  Orientation (0/6) 3  Total 12  Adjusted Score (based on education) 12   Thank you for allowing [place] the opportunity to participate in the care of  this nice patient. Please do not hesitate to contact us for any questions or concerns.   Total time spent on today's visit was *** minutes dedicated to this patient today, preparing to see patient, examining the patient, ordering tests and/or medications and counseling the patient, documenting clinical information in the EHR or other health record, independently interpreting results and communicating results to the patient/family, discussing treatment and goals, answering patient's questions and coordinating care.  Cc:  [name], MD  Sharene Butters 09/16/2022 6:41 AM

## 2022-09-19 ENCOUNTER — Ambulatory Visit: Payer: Medicare HMO | Admitting: Physician Assistant

## 2022-09-19 ENCOUNTER — Encounter: Payer: Self-pay | Admitting: Physician Assistant

## 2022-09-19 VITALS — BP 114/77 | HR 62 | Resp 18 | Ht 63.0 in | Wt 167.0 lb

## 2022-09-19 DIAGNOSIS — G309 Alzheimer's disease, unspecified: Secondary | ICD-10-CM | POA: Diagnosis not present

## 2022-09-19 DIAGNOSIS — R69 Illness, unspecified: Secondary | ICD-10-CM | POA: Diagnosis not present

## 2022-09-19 DIAGNOSIS — F0280 Dementia in other diseases classified elsewhere without behavioral disturbance: Secondary | ICD-10-CM

## 2022-09-19 MED ORDER — MEMANTINE HCL 5 MG PO TABS: ORAL_TABLET | ORAL | 11 refills | Status: DC

## 2022-09-19 NOTE — Patient Instructions (Addendum)
It was a pleasure to see you today at our office.   Recommendations:  Start Memantine '5mg'$  tablets.  Take 1 tablet at bedtime for 2 weeks, then 1 tablet twice daily.    Follow up in 3 month  Whom to call:  Memory  decline, memory medications: Call our office [phone]   For psychiatric meds, mood meds: Please have your primary care physician manage these medications.   Counseling regarding caregiver distress, including caregiver depression, anxiety and issues regarding community resources, adult day care programs, adult living facilities, or memory care questions:   Feel free to contact [place], Social Worker at [phone]   For assessment of decision of mental capacity and competency:  Call Dr. Anthoney Harada, geriatric psychiatrist at [phone]  For guidance in geriatric dementia issues please call [facility] [phone]  For guidance regarding [facility] and if placement were needed at the facility, contact Arnell Asal, Social Worker tel: [phone]  If you have any severe symptoms of a stroke, or other severe issues such as confusion,severe chills or fever, etc call 911 or go to the ER as you may need to be evaluated further   Feel free to visit Facebook page " [email or web address]" for tips of how to care for people with memory problems.       RECOMMENDATIONS FOR ALL PATIENTS WITH MEMORY PROBLEMS: 1. Continue to exercise (Recommend 30 minutes of walking everyday, or 3 hours every week) 2. Increase social interactions - continue going to [place] and enjoy social gatherings with friends and family 3. Eat healthy, avoid fried foods and eat more fruits and vegetables 4. Maintain adequate blood pressure, blood sugar, and blood cholesterol level. Reducing the risk of stroke and cardiovascular disease also helps promoting better memory. 5. Avoid stressful situations. Live a simple life and avoid aggravations. Organize your time and prepare  for the next day in anticipation. 6. Sleep well, avoid any interruptions of sleep and avoid any distractions in the bedroom that may interfere with adequate sleep quality 7. Avoid sugar, avoid sweets as there is a strong link between excessive sugar intake, diabetes, and cognitive impairment We discussed the Mediterranean diet, which has been shown to help patients reduce the risk of progressive memory disorders and reduces cardiovascular risk. This includes eating fish, eat fruits and green leafy vegetables, nuts like almonds and hazelnuts, walnuts, and also use olive oil. Avoid fast foods and fried foods as much as possible. Avoid sweets and sugar as sugar use has been linked to worsening of memory function.  There is always a concern of gradual progression of memory problems. If this is the case, then we may need to adjust level of care according to patient needs. Support, both to the patient and caregiver, should then be put into place.      You have been referred for a neuropsychological evaluation (i.e., evaluation of memory and thinking abilities). Please bring someone with you to this appointment if possible, as it is helpful for the doctor to hear from both you and another adult who knows you well. Please bring eyeglasses and hearing aids if you wear them.    The evaluation will take approximately 3 hours and has two parts:   The first part is a clinical interview with the neuropsychologist (Dr. Melvyn Novas or Dr. Nicole Kindred). During the interview, the neuropsychologist will speak with you and the individual you brought to the appointment.    The second part of the evaluation is testing with  the doctor's technician Hinton Dyer or Maudie Mercury). During the testing, the technician will ask you to remember different types of material, solve problems, and answer some questionnaires. Your family member will not be present for this portion of the evaluation.   Please note: We must reserve several hours of the  neuropsychologist's time and the psychometrician's time for your evaluation appointment. As such, there is a No-Show fee of $100. If you are unable to attend any of your appointments, please contact our office as soon as possible to reschedule.    FALL PRECAUTIONS: Be cautious when walking. Scan the area for obstacles that may increase the risk of trips and falls. When getting up in the mornings, sit up at the edge of the bed for a few minutes before getting out of bed. Consider elevating the bed at the head end to avoid drop of blood pressure when getting up. Walk always in a well-lit room (use night lights in the walls). Avoid area rugs or power cords from appliances in the middle of the walkways. Use a walker or a cane if necessary and consider physical therapy for balance exercise. Get your eyesight checked regularly.  FINANCIAL OVERSIGHT: Supervision, especially oversight when making financial decisions or transactions is also recommended.  HOME SAFETY: Consider the safety of the kitchen when operating appliances like stoves, microwave oven, and blender. Consider having supervision and share cooking responsibilities until no longer able to participate in those. Accidents with firearms and other hazards in the house should be identified and addressed as well.   ABILITY TO BE LEFT ALONE: If patient is unable to contact 911 operator, consider using LifeLine, or when the need is there, arrange for someone to stay with patients. Smoking is a fire hazard, consider supervision or cessation. Risk of wandering should be assessed by caregiver and if detected at any point, supervision and safe proof recommendations should be instituted.  MEDICATION SUPERVISION: Inability to self-administer medication needs to be constantly addressed. Implement a mechanism to ensure safe administration of the medications.   DRIVING: Regarding driving, in patients with progressive memory problems, driving will be impaired. We  advise to have someone else do the driving if trouble finding directions or if minor accidents are reported. Independent driving assessment is available to determine safety of driving.   If you are interested in the driving assessment, you can contact the following:  [employer] in [place]  [place] [phone] or [phone]    Bernie refers to food and lifestyle choices that are based on the traditions of countries located on the [employer]. This way of eating has been shown to help prevent certain conditions and improve outcomes for people who have chronic diseases, like kidney disease and heart disease. What are tips for following this plan? Lifestyle  Cook and eat meals together with your family, when possible. Drink enough fluid to keep your urine clear or pale yellow. Be physically active every day. This includes: Aerobic exercise like running or swimming. Leisure activities like gardening, walking, or housework. Get 7-8 hours of sleep each night. If recommended by your health care provider, drink red wine in moderation. This means 1 glass a day for nonpregnant women and 2 glasses a day for men. A glass of wine equals 5 oz (150 mL). Reading food labels  Check the serving size of packaged foods. For foods such as rice and pasta, the serving size  refers to the amount of cooked product, not dry. Check the total fat in packaged foods. Avoid foods that have saturated fat or trans fats. Check the ingredients list for added sugars, such as corn syrup. Shopping  At the grocery store, buy most of your food from the areas near the walls of the store. This includes: Fresh fruits and vegetables (produce). Grains, beans, nuts, and seeds. Some of these may be available in unpackaged forms or large amounts (in bulk). Fresh  seafood. Poultry and eggs. Low-fat dairy products. Buy whole ingredients instead of prepackaged foods. Buy fresh fruits and vegetables in-season from local farmers markets. Buy frozen fruits and vegetables in resealable bags. If you do not have access to quality fresh seafood, buy precooked frozen shrimp or canned fish, such as tuna, salmon, or sardines. Buy small amounts of raw or cooked vegetables, salads, or olives from the deli or salad bar at your store. Stock your pantry so you always have certain foods on hand, such as olive oil, canned tuna, canned tomatoes, rice, pasta, and beans. Cooking  Cook foods with extra-virgin olive oil instead of using butter or other vegetable oils. Have meat as a side dish, and have vegetables or grains as your main dish. This means having meat in small portions or adding small amounts of meat to foods like pasta or stew. Use beans or vegetables instead of meat in common dishes like chili or lasagna. Experiment with different cooking methods. Try roasting or broiling vegetables instead of steaming or sauteing them. Add frozen vegetables to soups, stews, pasta, or rice. Add nuts or seeds for added healthy fat at each meal. You can add these to yogurt, salads, or vegetable dishes. Marinate fish or vegetables using olive oil, lemon juice, garlic, and fresh herbs. Meal planning  Plan to eat 1 vegetarian meal one day each week. Try to work up to 2 vegetarian meals, if possible. Eat seafood 2 or more times a week. Have healthy snacks readily available, such as: Vegetable sticks with hummus. Greek yogurt. Fruit and nut trail mix. Eat balanced meals throughout the week. This includes: Fruit: 2-3 servings a day Vegetables: 4-5 servings a day Low-fat dairy: 2 servings a day Fish, poultry, or lean meat: 1 serving a day Beans and legumes: 2 or more servings a week Nuts and seeds: 1-2 servings a day Whole grains: 6-8 servings a day Extra-virgin olive oil: 3-4  servings a day Limit red meat and sweets to only a few servings a month What are my food choices? Mediterranean diet Recommended Grains: Whole-grain pasta. Brown rice. Bulgar wheat. Polenta. Couscous. Whole-wheat bread. [place]. Vegetables: Artichokes. Beets. Broccoli. Cabbage. Carrots. Eggplant. Green beans. Chard. Kale. Spinach. Onions. Leeks. Peas. Squash. Tomatoes. Peppers. Radishes. Fruits: Apples. Apricots. Avocado. Berries. Bananas. Cherries. Dates. Figs. Grapes. Lemons. Melon. Oranges. Peaches. Plums. Pomegranate. Meats and other protein foods: Beans. Almonds. Sunflower seeds. Pine nuts. Peanuts. [place]. Salmon. Scallops. Shrimp. [place]. Tilapia. Clams. Oysters. Eggs. Dairy: Low-fat milk. Cheese. Greek yogurt. Beverages: Water. Red wine. Herbal tea. Fats and oils: Extra virgin olive oil. Avocado oil. Grape seed oil. Sweets and desserts: [place] yogurt with honey. Baked apples. Poached pears. Trail mix. Seasoning and other foods: Basil. Cilantro. Coriander. Cumin. Mint. Parsley. Sage. Rosemary. Tarragon. Garlic. Oregano. Thyme. Pepper. Balsalmic vinegar. Tahini. Hummus. Tomato sauce. Olives. Mushrooms. Limit these Grains: Prepackaged pasta or rice dishes. Prepackaged cereal with added sugar. Vegetables: Deep fried potatoes (french fries). Fruits: Fruit canned in syrup. Meats and other protein foods: Beef. Pork. Lamb.  Poultry with skin. Hot dogs. Bernie[place] Salines. Dairy: Ice cream. Sour cream. Whole milk. Beverages: Juice. Sugar-sweetened soft drinks. Beer. Liquor and spirits. Fats and oils: Butter. Canola oil. Vegetable oil. Beef fat (tallow). Lard. Sweets and desserts: Cookies. Cakes. Pies. Candy. Seasoning and other foods: Mayonnaise. Premade sauces and marinades. The items listed may not be a complete list. Talk with your dietitian about what dietary choices are right for you. Summary The Mediterranean diet includes both food and lifestyle choices. Eat a variety of fresh fruits and  vegetables, beans, nuts, seeds, and whole grains. Limit the amount of red meat and sweets that you eat. Talk with your health care provider about whether it is safe for you to drink red wine in moderation. This means 1 glass a day for nonpregnant women and 2 glasses a day for men. A glass of wine equals 5 oz (150 mL). This information is not intended to replace advice given to you by your health care provider. Make sure you discuss any questions you have with your health care provider. Document Released: 06/23/2016 Document Revised: 07/26/2016 Document Reviewed: 06/23/2016 Elsevier Interactive Patient Education  2017 [employer].   We have sent a referral to [facility] for your MRI and they will call you directly to schedule your appointment. They are located at [place]. If you need to contact them directly please call [phone].  Your provider has requested that you have labwork completed today. Please go to [facility] ([id number]) on the second floor of this building before leaving the office today. You do not need to check in. If you are not called within 15 minutes please check with the front desk.

## 2022-09-19 NOTE — Progress Notes (Signed)
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 RH female seen today in follow up to discuss the 08/25/2022 MRI of the brain results. These were personally reviewed, remarkable for moderate chronic small vessel ischemic changes within the cerebral white matter and mild cerebral atrophy.  Patient is not on a dementia medication.    Follow up in  3 months. Start memantine 5 mg at night for 2 weeks, then increase to 5 mg twice daily if tolerated.  Side effects discussed Patient lives alone, for safety recommend either independent living or at adult living facility   Subjective:    This patient is  here alone who supplements the history.  Previous records as well as any outside records available were reviewed prior to todays visit.    Any changes in memory since last visit?  Patient denies Tolerating meds?  Patient was taking donepezil, has to be discontinued due to undesirable GI symptoms, now other agent is being entertained.   MRI brain was remarkable for   CURRENT MEDICATIONS:  Outpatient Encounter Medications as of 09/19/2022  Medication Sig   acetaminophen (TYLENOL) 500 MG tablet Take 500 mg by mouth 2 (two) times daily.   albuterol (VENTOLIN HFA) 108 (90 Base) MCG/ACT inhaler INHALE TWO PUFFS BY MOUTH EVERY 6 HOURS AS NEEDED FOR SHORTNESS OF BREATH   alendronate (FOSAMAX) 70 MG tablet Take 1 tablet (70 mg total) by mouth once a week.   calcium carbonate (OS-CAL) 600 MG TABS Take 600 mg by mouth 2 (two) times daily with a meal.   clozapine (CLOZARIL) 200 MG tablet Take 1 tablet by mouth at bedtime.    diclofenac Sodium (VOLTAREN) 1 % GEL Apply twice a day to affected area   donepezil (ARICEPT) 10 MG tablet Take 1 tablet (10 mg total) by mouth at bedtime.   ezetimibe (ZETIA) 10 MG tablet Take 1 tablet (10 mg total) by mouth daily.   famotidine (PEPCID) 20 MG tablet Take 1 tablet (20 mg total) by mouth  2 (two) times daily.   fenofibrate 160 MG tablet Take 1 tablet (160 mg total) by mouth daily.   fish oil-omega-3 fatty acids 1000 MG capsule Take 1 g by mouth every morning.   Iron, Ferrous Sulfate, 325 (65 Fe) MG TABS Take 325 mg by mouth daily with breakfast.   metFORMIN (GLUCOPHAGE) 500 MG tablet Take 1 tablet (500 mg total) by mouth 2 (two) times daily with a meal.   Multiple Vitamins-Minerals (CENTRUM SILVER ULTRA WOMENS PO) Take 1 tablet by mouth daily.   OneTouch Delica Lancets 41Y MISC USE TO TEST BLOOD SUGAR   ONETOUCH VERIO test strip USE TO TEST BLOOD SUGAR   SYMBICORT 160-4.5 MCG/ACT inhaler INHALE TWO PUFFS INTO THE LUNGS IN THE MORNING AND AT BEDTIME   vitamin C (ASCORBIC ACID) 500 MG tablet Take 500 mg by mouth every morning.   VITAMIN D, CHOLECALCIFEROL, PO Take 600 Units by mouth daily.   No facility-administered encounter medications on file as of 09/19/2022.        No data to display            08/11/2022   10:00 AM  Montreal Cognitive Assessment   Visuospatial/ Executive (0/5) 1  Naming (0/3) 3  Attention: Read list of digits (0/2) 1  Attention: Read list of letters (0/1) 1  Attention: Serial 7 subtraction starting at 100 (0/3) 1  Language: Repeat phrase (0/2)  1  Language : Fluency (0/1) 0  Abstraction (0/2) 1  Delayed Recall (0/5) 0  Orientation (0/6) 3  Total 12  Adjusted Score (based on education) 12   Thank you for allowing [place] the opportunity to participate in the care of this nice patient. Please do not hesitate to contact us for any questions or concerns.   Total time spent on today's visit was 22 minutes dedicated to this patient today, preparing to see patient, examining the patient, ordering tests and/or medications and counseling the patient, documenting clinical information in the EHR or other health record, independently interpreting results and communicating results to the patient/family, discussing treatment and goals, answering patient's  questions and coordinating care.  Cc:  [name], MD  Sharene Butters 09/19/2022 12:03 PM

## 2022-09-27 ENCOUNTER — Ambulatory Visit: Payer: Medicare HMO | Admitting: Podiatry

## 2022-09-27 ENCOUNTER — Encounter: Payer: Self-pay | Admitting: Podiatry

## 2022-09-27 DIAGNOSIS — M79675 Pain in left toe(s): Secondary | ICD-10-CM

## 2022-09-27 DIAGNOSIS — L608 Other nail disorders: Secondary | ICD-10-CM

## 2022-09-27 DIAGNOSIS — M79674 Pain in right toe(s): Secondary | ICD-10-CM | POA: Diagnosis not present

## 2022-09-27 DIAGNOSIS — B351 Tinea unguium: Secondary | ICD-10-CM | POA: Diagnosis not present

## 2022-09-27 DIAGNOSIS — E119 Type 2 diabetes mellitus without complications: Secondary | ICD-10-CM | POA: Diagnosis not present

## 2022-09-27 NOTE — Progress Notes (Signed)
This patient returns to my office for at risk foot care.  This patient requires this care by a professional since this patient will be at risk due to having diabetes.    She has pain on the outside border second toenail left.   She has callus under her big toe joint  both feet.  This patient presents for at risk foot care today.  General Appearance  Alert, conversant and in no acute stress.  Vascular  Dorsalis pedis and posterior tibial  pulses are palpable  bilaterally.  Capillary return is within normal limits  bilaterally. Temperature is within normal limits  bilaterally.  Neurologic  Senn-Weinstein monofilament wire test diminished  bilaterally. Muscle power within normal limits bilaterally.  Nails Pincer nail second toe left foot .  Long thick hallux nails.  No infection noted.  Orthopedic  No limitations of motion  feet .  No crepitus or effusions noted.  No bony pathology or digital deformities noted.  HAV  B/L.  Skin  normotropic skin with no porokeratosis noted bilaterally.  No signs of infections or ulcers noted.   Asymptomatic callus sub 1  B/L.  Pincer second toenail left foot.  Callus  B/L.  Consent was obtained for treatment procedures.   Debridement of pincer nail and nail spicules with a nail nipper followed by dremel tool usage.     Return office visit      3 months              Told patient to return for periodic foot care and evaluation due to potential at risk complications.   Gardiner Barefoot DPM

## 2022-10-04 ENCOUNTER — Ambulatory Visit: Payer: Medicare HMO | Admitting: Critical Care Medicine

## 2022-10-04 ENCOUNTER — Ambulatory Visit: Payer: Medicare HMO | Admitting: Family Medicine

## 2022-10-18 ENCOUNTER — Ambulatory Visit: Payer: Medicare HMO | Admitting: Critical Care Medicine

## 2022-10-27 ENCOUNTER — Other Ambulatory Visit: Payer: Self-pay | Admitting: Critical Care Medicine

## 2022-11-03 ENCOUNTER — Ambulatory Visit: Payer: Medicare HMO | Admitting: Critical Care Medicine

## 2022-11-17 ENCOUNTER — Telehealth: Payer: Self-pay

## 2022-11-17 ENCOUNTER — Ambulatory Visit: Payer: Medicare HMO | Attending: Critical Care Medicine | Admitting: Critical Care Medicine

## 2022-11-17 ENCOUNTER — Encounter: Payer: Self-pay | Admitting: Critical Care Medicine

## 2022-11-17 VITALS — BP 130/80 | HR 88 | Ht 63.0 in | Wt 168.0 lb

## 2022-11-17 DIAGNOSIS — G309 Alzheimer's disease, unspecified: Secondary | ICD-10-CM | POA: Diagnosis not present

## 2022-11-17 DIAGNOSIS — T466X5A Adverse effect of antihyperlipidemic and antiarteriosclerotic drugs, initial encounter: Secondary | ICD-10-CM

## 2022-11-17 DIAGNOSIS — K219 Gastro-esophageal reflux disease without esophagitis: Secondary | ICD-10-CM | POA: Diagnosis not present

## 2022-11-17 DIAGNOSIS — G720 Drug-induced myopathy: Secondary | ICD-10-CM

## 2022-11-17 DIAGNOSIS — E119 Type 2 diabetes mellitus without complications: Secondary | ICD-10-CM

## 2022-11-17 DIAGNOSIS — F0280 Dementia in other diseases classified elsewhere without behavioral disturbance: Secondary | ICD-10-CM

## 2022-11-17 DIAGNOSIS — Z23 Encounter for immunization: Secondary | ICD-10-CM

## 2022-11-17 DIAGNOSIS — F02818 Dementia in other diseases classified elsewhere, unspecified severity, with other behavioral disturbance: Secondary | ICD-10-CM | POA: Diagnosis not present

## 2022-11-17 DIAGNOSIS — I10 Essential (primary) hypertension: Secondary | ICD-10-CM

## 2022-11-17 DIAGNOSIS — J4540 Moderate persistent asthma, uncomplicated: Secondary | ICD-10-CM

## 2022-11-17 DIAGNOSIS — F209 Schizophrenia, unspecified: Secondary | ICD-10-CM

## 2022-11-17 LAB — GLUCOSE, POCT (MANUAL RESULT ENTRY): POC Glucose: 147 mg/dl — AB (ref 70–99)

## 2022-11-17 LAB — POCT GLYCOSYLATED HEMOGLOBIN (HGB A1C): HbA1c, POC (prediabetic range): 6.2 % (ref 5.7–6.4)

## 2022-11-17 MED ORDER — METFORMIN HCL 500 MG PO TABS: 500.0000 mg | ORAL_TABLET | Freq: Two times a day (BID) | ORAL | 2 refills | Status: DC

## 2022-11-17 MED ORDER — EZETIMIBE 10 MG PO TABS: 10.0000 mg | ORAL_TABLET | Freq: Every day | ORAL | 3 refills | Status: DC

## 2022-11-17 MED ORDER — ALBUTEROL SULFATE HFA 108 (90 BASE) MCG/ACT IN AERS: INHALATION_SPRAY | RESPIRATORY_TRACT | 3 refills | Status: DC

## 2022-11-17 MED ORDER — ALENDRONATE SODIUM 70 MG PO TABS: 70.0000 mg | ORAL_TABLET | ORAL | 3 refills | Status: DC

## 2022-11-17 MED ORDER — DONEPEZIL HCL 10 MG PO TABS: 10.0000 mg | ORAL_TABLET | Freq: Every day | ORAL | 3 refills | Status: DC

## 2022-11-17 MED ORDER — SYMBICORT 160-4.5 MCG/ACT IN AERO: INHALATION_SPRAY | RESPIRATORY_TRACT | 2 refills | Status: DC

## 2022-11-17 MED ORDER — IRON (FERROUS SULFATE) 325 (65 FE) MG PO TABS: 325.0000 mg | ORAL_TABLET | Freq: Every day | ORAL | 4 refills | Status: AC

## 2022-11-17 MED ORDER — FENOFIBRATE 160 MG PO TABS: 160.0000 mg | ORAL_TABLET | Freq: Every day | ORAL | 6 refills | Status: DC

## 2022-11-17 MED ORDER — MEMANTINE HCL 5 MG PO TABS: ORAL_TABLET | ORAL | 11 refills | Status: DC

## 2022-11-17 MED ORDER — FAMOTIDINE 20 MG PO TABS: 20.0000 mg | ORAL_TABLET | Freq: Two times a day (BID) | ORAL | 0 refills | Status: DC

## 2022-11-17 MED ORDER — ONETOUCH VERIO VI STRP: ORAL_STRIP | 2 refills | Status: DC

## 2022-11-17 MED ORDER — ONETOUCH DELICA LANCETS 33G MISC: 1 refills | Status: DC

## 2022-11-17 MED ORDER — DICLOFENAC SODIUM 1 % EX GEL: CUTANEOUS | 3 refills | Status: DC

## 2022-11-17 NOTE — Patient Instructions (Signed)
Refills on medications sent to [facility] form filled out  I will have Opal Sidles our clinical case manager call you about assisted living  Labs include metabolic panel blood count and urine for protein  Return to Dr. Joya Gaskins 5 months  Tetanus vaccine given

## 2022-11-17 NOTE — Progress Notes (Signed)
Notes  Established Patient Office Visit  Subjective:  Patient ID: Elizabeth Schwartz, female    DOB: [birth year]  Age: 77 y.o. MRN: [id number]  CC:  Primary care follow-up  HPI 09/15/21 Elizabeth Schwartz presents for primary care follow-up.  She has an issue with loose stools and bowel incontinence.  She is having to wear depends now.  She states there is mucus in the diarrhea.  Note we have worked this up in the past with stools for pathogens and this was negative.  Patient denies any nausea or vomiting or other GI complaints.  Patient also complains of chronic right hip and left knee pain.  She has not had any falls.  Patient also continues with her mental health medications.  She has history of diagnosed schizophrenia and neurocognitive disorder due to multiple etiologies.  Her diabetes is well controlled and on arrival hemoglobin A1c was 6.0.  Blood pressure is good at 117/67.  Patient's not had any flareups of her asthma as well.  Patient did have a Cologuard test and we did not receive the result the patient was told it was negative.  01/25/22 Patient returns in follow-up and has slight increase in memory difficulty otherwise doing reasonably well however on arrival blood pressure initially was elevated but on recheck was 124/84.  She did have a fall in November but has had no falls since that time it was because of a low blood pressure.  Her ramipril was discontinued she is off all blood pressure medicines now.  Blood sugar on arrival was 98.  She brings all her medications with her and we did a complete medication reconciliation at this visit.  The patient needs a Prevnar 20 vaccine and she will receive it.  She is up-to-date speed on all of her other primary care gaps Patient does have some chronic shoulder pain she has been followed by orthopedics plans to call them again she has had injections done  7/18 Patient seen in return follow-up he confirms that she did get severe myopathy with statins and  this is clearly documented in the chart.  She is taking in place of statins fish oil, Zetia, fenofibrate.  She is also trying to follow a healthier diet.  Patient is seen orthopedics recently had injection of the left shoulder with steroids this is improved her left shoulder pain.  She still has pain at the base of the neck.  On arrival blood pressure 124/76.  Her blood sugars at home have ranged 131 170. A1c is good at 5.9.  Patient does complain of some increased blurred vision.  She does have an eye doctor and she will need to see sooner.  Also history of iron deficiency and will need to have her renal function blood levels checked at this visit.  There are no other complaints.  She needs multiple medications refilled.  9/20  Patient seen in return follow-up from July visit on a short-term basis because of complaints of progression in memory loss.  Appears to be worse over the past several months.  She has been on Aricept she cannot tell this made any difference.  She has difficulty organizing her schedule and forgetting appointments.  She is doing her own pill organizer she claims she is putting the pills incorrectly out of the bottles.  She was however when we went over her medications somewhat confused as to what medications were for which purpose.  Note she is divorced from her husband she has no children her siblings are  all deceased she has 2 nieces living in [place] but they did not associate with her.  She does live in a retirement apartment [employer].  There is an Radiographer, therapeutic that organizes events in the community center for the residents.  The apartment manager was concerned about her mental status and did notify us of need for this visit.  Patient did agree to and received her flu vaccine at this visit.  There are no other complaints.  A1c at the last visit was good at 5.9.  Patient was in the ER in December for a fall but hadHas not her fall since that  time.  11/17/22 Patient seen in return follow-up she still in an independent living apartment.  Her mental status has remained about the same however her apartment manager feels she would benefit from assisted living the patient agrees.  She likely needs memory care.  A1c on arrival 6.2 blood pressure is 130/80.  Patient needs a handicap plate renewed.  She needs refills on multiple medications.  I did complete total medication reconciliation and all medications the medication list are accurate.  She does use a pill organizer.  There are no other complaints.  She has had no falls since last visit. Past Medical History:  Diagnosis Date   Anemia    COPD (chronic obstructive pulmonary disease) (HCC)    Depression    Diabetes mellitus    GERD (gastroesophageal reflux disease)    Hyperlipidemia    Hypertension    Osteoporosis    Schizophrenia, schizo-affective (HCC)    SOB (shortness of breath) on exertion    Syncope 06/11/2019    Past Surgical History:  Procedure Laterality Date   BREAST BIOPSY Right 2017   benign   LAPAROSCOPY  1975   ABD pain   VAGINAL HYSTERECTOMY  1975    Family History  Problem Relation Age of Onset   Breast cancer Neg Hx     Social History   Socioeconomic History   Marital status: Single    Spouse name: Not on file   Number of children: 0   Years of education: 12   Highest education level: Not on file  Occupational History   Not on file  Tobacco Use   Smoking status: Former    Years: 15.00    Types: Cigarettes    Quit date: 02/28/2012    Years since quitting: 10.7   Smokeless tobacco: Former    Types: Nurse, children's Use: Never used  Substance and Sexual Activity   Alcohol use: Yes    Comment: "socially"   Drug use: No   Sexual activity: Not Currently  Other Topics Concern   Not on file  Social History Narrative   Right handed   Drinks caffeine   Apartment on 3rd floor   Retired   Investment banker, operational of Systems developer Strain: Not on file  Food Insecurity: Not on file  Transportation Needs: Not on file  Physical Activity: Not on file  Stress: Not on file  Social Connections: Not on file  Intimate Partner Violence: Not on file    Outpatient Medications Prior to Visit  Medication Sig Dispense Refill   calcium carbonate (OS-CAL) 600 MG TABS Take 600 mg by mouth 2 (two) times daily with a meal.     clozapine (CLOZARIL) 200 MG tablet Take 1 tablet by mouth at bedtime.      fish oil-omega-3 fatty acids 1000 MG capsule  Take 1 g by mouth every morning.     Multiple Vitamins-Minerals (CENTRUM SILVER ULTRA WOMENS PO) Take 1 tablet by mouth daily.     vitamin C (ASCORBIC ACID) 500 MG tablet Take 500 mg by mouth every morning.     VITAMIN D, CHOLECALCIFEROL, PO Take 600 Units by mouth daily.     albuterol (VENTOLIN HFA) 108 (90 Base) MCG/ACT inhaler INHALE TWO PUFFS BY MOUTH EVERY 6 HOURS AS NEEDED FOR SHORTNESS OF BREATH 18 g 3   alendronate (FOSAMAX) 70 MG tablet Take 1 tablet (70 mg total) by mouth once a week. 12 tablet 3   diclofenac Sodium (VOLTAREN) 1 % GEL Apply twice a day to affected area 50 g 3   donepezil (ARICEPT) 10 MG tablet Take 1 tablet (10 mg total) by mouth at bedtime. 90 tablet 3   ezetimibe (ZETIA) 10 MG tablet Take 1 tablet (10 mg total) by mouth daily. 90 tablet 3   famotidine (PEPCID) 20 MG tablet TAKE ONE TABLET BY MOUTH TWICE A DAY 180 tablet 0   fenofibrate 160 MG tablet Take 1 tablet (160 mg total) by mouth daily. 60 tablet 6   Iron, Ferrous Sulfate, 325 (65 Fe) MG TABS Take 325 mg by mouth daily with breakfast. 60 tablet 4   memantine (NAMENDA) 5 MG tablet Take 1 tablet (5 mg at night) for 2 weeks, then increase to 1 tablet (5 mg) twice a day 60 tablet 11   metFORMIN (GLUCOPHAGE) 500 MG tablet Take 1 tablet (500 mg total) by mouth 2 (two) times daily with a meal. 180 tablet 2   OneTouch Delica Lancets 10C MISC USE TO TEST BLOOD SUGAR 100 each 1   ONETOUCH VERIO test strip USE  TO TEST BLOOD SUGAR 100 each 2   SYMBICORT 160-4.5 MCG/ACT inhaler INHALE TWO PUFFS INTO THE LUNGS IN THE MORNING AND AT BEDTIME 10.2 g 2   acetaminophen (TYLENOL) 500 MG tablet Take 500 mg by mouth 2 (two) times daily. (Patient not taking: Reported on 11/17/2022)     No facility-administered medications prior to visit.    Allergies  Allergen Reactions   Crestor [Rosuvastatin]     Muscle aches.     ROS Review of Systems  Constitutional: Negative.   HENT: Negative.  Negative for ear pain, postnasal drip, rhinorrhea, sinus pressure, sore throat, trouble swallowing and voice change.   Eyes:  Positive for visual disturbance.  Respiratory: Negative.  Negative for apnea, cough, choking, chest tightness, shortness of breath, wheezing and stridor.   Cardiovascular: Negative.  Negative for chest pain, palpitations and leg swelling.  Gastrointestinal:  Negative for abdominal distention, abdominal pain, anal bleeding, blood in stool, constipation, diarrhea, nausea, rectal pain and vomiting.  Genitourinary: Negative.   Musculoskeletal:  Positive for arthralgias. Negative for myalgias.       Multiple joint pains  Skin: Negative.  Negative for rash.  Allergic/Immunologic: Negative.  Negative for environmental allergies and food allergies.  Neurological: Negative.  Negative for dizziness, tremors, seizures, syncope, facial asymmetry, speech difficulty, weakness, light-headedness, numbness and headaches.  Hematological: Negative.  Negative for adenopathy. Does not bruise/bleed easily.  Psychiatric/Behavioral:  Positive for decreased concentration. Negative for agitation, confusion, dysphoric mood, hallucinations, self-injury, sleep disturbance and suicidal ideas. The patient is not nervous/anxious and is not hyperactive.       Objective:    Physical Exam Vitals reviewed.  Constitutional:      Appearance: Normal appearance. She is well-developed. She is not diaphoretic.  HENT:  Head:  Normocephalic and atraumatic.     Nose: Nose normal. No nasal deformity, septal deviation, mucosal edema or rhinorrhea.     Right Sinus: No maxillary sinus tenderness or frontal sinus tenderness.     Left Sinus: No maxillary sinus tenderness or frontal sinus tenderness.     Mouth/Throat:     Mouth: Mucous membranes are moist.     Pharynx: Oropharynx is clear. No oropharyngeal exudate.  Eyes:     General: No scleral icterus.       Right eye: No discharge.        Left eye: No discharge.     Extraocular Movements: Extraocular movements intact.     Conjunctiva/sclera: Conjunctivae normal.     Pupils: Pupils are equal, round, and reactive to light.     Comments: No visual field cuts  Neck:     Thyroid: No thyromegaly.     Vascular: No carotid bruit or JVD.     Trachea: Trachea normal. No tracheal tenderness or tracheal deviation.  Cardiovascular:     Rate and Rhythm: Normal rate and regular rhythm.     Chest Wall: PMI is not displaced.     Pulses: Normal pulses. No decreased pulses.     Heart sounds: Normal heart sounds, S1 normal and S2 normal. Heart sounds not distant. No murmur heard.    No systolic murmur is present.     No diastolic murmur is present.     No friction rub. No gallop. No S3 or S4 sounds.  Pulmonary:     Effort: No tachypnea, accessory muscle usage or respiratory distress.     Breath sounds: No stridor. No decreased breath sounds, wheezing, rhonchi or rales.  Chest:     Chest wall: No tenderness.  Abdominal:     General: Bowel sounds are normal. There is no distension.     Palpations: Abdomen is soft. Abdomen is not rigid.     Tenderness: There is no abdominal tenderness. There is no guarding or rebound.  Musculoskeletal:        General: Normal range of motion.     Cervical back: Normal range of motion and neck supple. No edema, erythema or rigidity. No muscular tenderness. Normal range of motion.  Lymphadenopathy:     Head:     Right side of head: No submental  or submandibular adenopathy.     Left side of head: No submental or submandibular adenopathy.     Cervical: No cervical adenopathy.  Skin:    General: Skin is warm and dry.     Coloration: Skin is not pale.     Findings: No rash.     Nails: There is no clubbing.  Neurological:     General: No focal deficit present.     Mental Status: She is alert and oriented to person, place, and time.     Cranial Nerves: Cranial nerves 2-12 are intact. No cranial nerve deficit.     Sensory: Sensation is intact. No sensory deficit.     Motor: Motor function is intact. No weakness.     Coordination: Coordination is intact. Coordination normal.     Gait: Gait is intact. Gait normal.     Deep Tendon Reflexes: Reflexes normal.  Psychiatric:        Attention and Perception: Attention and perception normal.        Mood and Affect: Mood and affect normal.        Speech: Speech normal.  Behavior: Behavior normal.        Thought Content: Thought content normal.        Cognition and Memory: Cognition is impaired. Memory is impaired. She exhibits impaired recent memory and impaired remote memory.        Judgment: Judgment normal.   Get up and go test was normal  BP 130/80   Pulse 88   Ht _0  (1.6 m)   Wt 168 lb (76.2 kg)   SpO2 98%   BMI 29.76 kg/m  Wt Readings from Last 3 Encounters:  11/17/22 168 lb (76.2 kg)  09/19/22 167 lb (75.8 kg)  08/11/22 168 lb (76.2 kg)     Health Maintenance Due  Topic Date Due   Medicare Annual Wellness (AWV)  Never done   COVID-19 Vaccine (6 - 2023-24 season) 07/15/2022   Diabetic kidney evaluation - Urine ACR  09/15/2022      There are no preventive care reminders to display for this patient.  Lab Results  Component Value Date   TSH 1.90 08/11/2022   Lab Results  Component Value Date   WBC 4.9 05/31/2022   HGB 10.4 (L) 05/31/2022   HCT 31.5 (L) 05/31/2022   MCV 90 05/31/2022   PLT 304 05/31/2022   Lab Results  Component Value Date   NA  141 05/31/2022   K 4.5 05/31/2022   CO2 21 05/31/2022   GLUCOSE 119 (H) 05/31/2022   BUN 36 (H) 05/31/2022   CREATININE 1.33 (H) 05/31/2022   BILITOT <0.2 09/15/2021   ALKPHOS 40 (L) 09/15/2021   AST 27 09/15/2021   ALT 28 09/15/2021   PROT 6.5 09/15/2021   ALBUMIN 4.7 09/15/2021   CALCIUM 9.9 05/31/2022   ANIONGAP 15 03/25/2019   EGFR 42 (L) 05/31/2022   Lab Results  Component Value Date   CHOL 188 05/31/2022   Lab Results  Component Value Date   HDL 54 05/31/2022   Lab Results  Component Value Date   LDLCALC 94 05/31/2022   Lab Results  Component Value Date   TRIG 239 (H) 05/31/2022   Lab Results  Component Value Date   CHOLHDL 3.5 05/31/2022   Lab Results  Component Value Date   HGBA1C 6.2 11/17/2022      Assessment & Plan:   Problem List Items Addressed This Visit       Cardiovascular and Mediastinum   Hypertension    Hypertension well-controlled no change in medications currently not on medicines      Relevant Medications   ezetimibe (ZETIA) 10 MG tablet   fenofibrate 160 MG tablet   Other Relevant Orders   CBC with Differential/Platelet     Respiratory   Asthma, moderate persistent    Continue with Symbicort      Relevant Medications   albuterol (VENTOLIN HFA) 108 (90 Base) MCG/ACT inhaler   SYMBICORT 160-4.5 MCG/ACT inhaler     Digestive   GERD    Continue with proton pump inhibitor      Relevant Medications   famotidine (PEPCID) 20 MG tablet     Endocrine   Diabetes type 2, controlled (HCC)   Relevant Medications   metFORMIN (GLUCOPHAGE) 500 MG tablet   Other Relevant Orders   POCT glycosylated hemoglobin (Hb A1C) (Completed)   Comprehensive metabolic panel   Microalbumin / creatinine urine ratio   POCT glucose (manual entry) (Completed)     Nervous and Auditory   Major neurocognitive disorder due to multiple etiologies with behavioral disturbance ([place])  Plan assisted living if she is available      Relevant  Medications   donepezil (ARICEPT) 10 MG tablet   memantine (NAMENDA) 5 MG tablet   Dementia due to Alzheimer's disease ([place]) - Primary   Relevant Medications   donepezil (ARICEPT) 10 MG tablet   memantine (NAMENDA) 5 MG tablet   Other Relevant Orders   AMB Referral to [facility] ([facility] Patients)     Musculoskeletal and Integument   Statin myopathy    Continue Zetia and fenofibrate reassess lipids        Other   Schizophrenia ([place])    Management per psychiatry      Meds ordered this encounter  Medications   albuterol (VENTOLIN HFA) 108 (90 Base) MCG/ACT inhaler    Sig: INHALE TWO PUFFS BY MOUTH EVERY 6 HOURS AS NEEDED FOR SHORTNESS OF BREATH    Dispense:  18 g    Refill:  3   alendronate (FOSAMAX) 70 MG tablet    Sig: Take 1 tablet (70 mg total) by mouth once a week.    Dispense:  12 tablet    Refill:  3   diclofenac Sodium (VOLTAREN) 1 % GEL    Sig: Apply twice a day to affected area    Dispense:  50 g    Refill:  3   donepezil (ARICEPT) 10 MG tablet    Sig: Take 1 tablet (10 mg total) by mouth at bedtime.    Dispense:  90 tablet    Refill:  3   ezetimibe (ZETIA) 10 MG tablet    Sig: Take 1 tablet (10 mg total) by mouth daily.    Dispense:  90 tablet    Refill:  3   famotidine (PEPCID) 20 MG tablet    Sig: Take 1 tablet (20 mg total) by mouth 2 (two) times daily.    Dispense:  180 tablet    Refill:  0   fenofibrate 160 MG tablet    Sig: Take 1 tablet (160 mg total) by mouth daily.    Dispense:  60 tablet    Refill:  6   Iron, Ferrous Sulfate, 325 (65 Fe) MG TABS    Sig: Take 325 mg by mouth daily with breakfast.    Dispense:  60 tablet    Refill:  4   memantine (NAMENDA) 5 MG tablet    Sig: Take 1 tablet (5 mg at night) for 2 weeks, then increase to 1 tablet (5 mg) twice a day    Dispense:  60 tablet    Refill:  11   metFORMIN (GLUCOPHAGE) 500 MG tablet    Sig: Take 1 tablet (500 mg total) by mouth 2 (two) times daily with a meal.     Dispense:  180 tablet    Refill:  2   OneTouch Delica Lancets 03K MISC    Sig: Use to check blood sugar daily    Dispense:  100 each    Refill:  1   ONETOUCH VERIO test strip    Sig: Use as instructed    Dispense:  100 each    Refill:  2   SYMBICORT 160-4.5 MCG/ACT inhaler    Sig: INHALE TWO PUFFS INTO THE LUNGS IN THE MORNING AND AT BEDTIME    Dispense:  10.2 g    Refill:  2   Follow-up: Return in about 5 months (around 04/18/2023) for htn, diabetes, hyperlipidemia.    Asencion Noble, MD

## 2022-11-17 NOTE — Progress Notes (Signed)
Symbicort no longer covered under new insurance.

## 2022-11-17 NOTE — Assessment & Plan Note (Signed)
Management per psychiatry

## 2022-11-17 NOTE — Assessment & Plan Note (Signed)
Hypertension well-controlled no change in medications currently not on medicines

## 2022-11-17 NOTE — Telephone Encounter (Signed)
I met with the patient when she was in the clinic today.  She requested a list of ALFs. I provided her with the listing of ALFs in [facility] from [facility].    She does not have Medicaid at this time and reports her monthly income is about $1500/month.  She said she will need to call the facilities for more information and she would also like to visit the facilities.  I explained that the facilities will likely be private pay unless they accept Medicaid Special Assistance.  She is also interested in memory care and I explained that those facilities are listed as having dementia units.  She said she would speak to " Olivia Mackie" at her apartment complex about what options would be best for her. She Olivia Mackie is not a Education officer, museum but is " like a Education officer, museum."   I instructed her to call me with any questions/assistance and I also referred her to [facility] for additional assistance.

## 2022-11-17 NOTE — Assessment & Plan Note (Signed)
Continue with proton pump inhibitor

## 2022-11-17 NOTE — Assessment & Plan Note (Signed)
Continue Zetia and fenofibrate reassess lipids

## 2022-11-17 NOTE — Assessment & Plan Note (Signed)
Plan assisted living if she is available

## 2022-11-17 NOTE — Assessment & Plan Note (Signed)
Continue with Symbicort

## 2022-11-18 ENCOUNTER — Telehealth: Payer: Self-pay | Admitting: *Deleted

## 2022-11-18 LAB — CBC WITH DIFFERENTIAL/PLATELET
Basophils Absolute: 0 10*3/uL (ref 0.0–0.2)
Basos: 1 %
EOS (ABSOLUTE): 0.2 10*3/uL (ref 0.0–0.4)
Eos: 3 %
Hematocrit: 33.5 % — ABNORMAL LOW (ref 34.0–46.6)
Hemoglobin: 10.8 g/dL — ABNORMAL LOW (ref 11.1–15.9)
Immature Grans (Abs): 0 10*3/uL (ref 0.0–0.1)
Immature Granulocytes: 1 %
Lymphocytes Absolute: 1.6 10*3/uL (ref 0.7–3.1)
Lymphs: 29 %
MCH: 29.8 pg (ref 26.6–33.0)
MCHC: 32.2 g/dL (ref 31.5–35.7)
MCV: 92 fL (ref 79–97)
Monocytes Absolute: 0.5 10*3/uL (ref 0.1–0.9)
Monocytes: 8 %
Neutrophils Absolute: 3.3 10*3/uL (ref 1.4–7.0)
Neutrophils: 58 %
Platelets: 263 10*3/uL (ref 150–450)
RBC: 3.63 x10E6/uL — ABNORMAL LOW (ref 3.77–5.28)
RDW: 13.2 % (ref 11.7–15.4)
WBC: 5.6 10*3/uL (ref 3.4–10.8)

## 2022-11-18 LAB — COMPREHENSIVE METABOLIC PANEL
ALT: 31 IU/L (ref 0–32)
AST: 30 IU/L (ref 0–40)
Albumin/Globulin Ratio: 2.3 — ABNORMAL HIGH (ref 1.2–2.2)
Albumin: 4.3 g/dL (ref 3.8–4.8)
Alkaline Phosphatase: 41 IU/L — ABNORMAL LOW (ref 44–121)
BUN/Creatinine Ratio: 20 (ref 12–28)
BUN: 21 mg/dL (ref 8–27)
Bilirubin Total: <0.2 mg/dL (ref 0.0–1.2)
CO2: 22 mmol/L (ref 20–29)
Calcium: 9.2 mg/dL (ref 8.7–10.3)
Chloride: 105 mmol/L (ref 96–106)
Creatinine, Ser: 1.07 mg/dL — ABNORMAL HIGH (ref 0.57–1.00)
Globulin, Total: 1.9 g/dL (ref 1.5–4.5)
Glucose: 115 mg/dL — ABNORMAL HIGH (ref 70–99)
Potassium: 4 mmol/L (ref 3.5–5.2)
Sodium: 141 mmol/L (ref 134–144)
Total Protein: 6.2 g/dL (ref 6.0–8.5)
eGFR: 54 mL/min/{1.73_m2} — ABNORMAL LOW (ref >59)

## 2022-11-18 LAB — MICROALBUMIN / CREATININE URINE RATIO
Creatinine, Urine: 164.2 mg/dL
Microalb/Creat Ratio: 6 mg/g creat (ref 0–29)
Microalbumin, Urine: 9.3 ug/mL

## 2022-11-18 NOTE — Progress Notes (Signed)
  Care Coordination  Outreach Note  11/18/2022 Name: Indria Bishara MRN: [id number] DOB: 1946-04-16   Care Coordination Outreach Attempts: An unsuccessful telephone outreach was attempted today to offer the patient information about available care coordination services as a benefit of their health plan.   Follow Up Plan:  Additional outreach attempts will be made to offer the patient care coordination information and services.   Encounter Outcome:  No Answer  [place]  Direct Dial: [phone]

## 2022-11-20 NOTE — Progress Notes (Signed)
Let pt know urine protein normal no kidney damage rest of labs normal

## 2022-11-21 ENCOUNTER — Telehealth: Payer: Self-pay

## 2022-11-21 NOTE — Telephone Encounter (Signed)
-----   Message from [name], MD sent at 11/20/2022  9:14 AM EST ----- Let pt know urine protein normal no kidney damage rest of labs normal

## 2022-11-21 NOTE — Progress Notes (Unsigned)
  Care Coordination  Outreach Note  11/21/2022 Name: Elizabeth Schwartz MRN: [id number] DOB: 1945/12/20   Care Coordination Outreach Attempts: A second unsuccessful outreach was attempted today to offer the patient with information about available care coordination services as a benefit of their health plan.     Follow Up Plan:  Additional outreach attempts will be made to offer the patient care coordination information and services.   Encounter Outcome:  No Answer  [place]  Direct Dial: [phone]

## 2022-11-21 NOTE — Telephone Encounter (Signed)
Pt was called and vm was left, Information has been sent to nurse pool.   

## 2022-11-24 ENCOUNTER — Telehealth: Payer: Self-pay | Admitting: *Deleted

## 2022-11-24 NOTE — Telephone Encounter (Signed)
I returned the call to the patient. She said she thinks someone called her about her lab results. I shared her results her - per Dr Joya Gaskins: urine protein normal no kidney damage rest of labs normal.  She then told me she has checked out a few AFLs and she was not pleased with them.   She went to [facility] on 11/20/2022 and was very pleased with that facility.  She said she spoke to one of the aides because Pamala Hurry, Clinical biochemist was not there.  She was told that they only had 2 female beds open.  She was also told she needed $1600.  I instructed her to call back and ask for [facility] and request to be put on a wait list for memory care.   I also instructed her to ask if they will assist her with applying for special assistance Medicaid.  I told her to call me back with any questions and also with an update after she speaks to the director of the facility.

## 2022-11-24 NOTE — Telephone Encounter (Signed)
Pt is calling in to speak back with nurse Opal Sidles B. Pt would like a call back at the phone number on file.

## 2022-11-24 NOTE — Progress Notes (Signed)
  Care Coordination  Outreach Note  11/24/2022 Name: Elizabeth Schwartz MRN: [id number] DOB: 1946-04-26   Care Coordination Outreach Attempts: A third unsuccessful outreach was attempted today to offer the patient with information about available care coordination services as a benefit of their health plan.   Follow Up Plan:  No further outreach attempts will be made at this time. We have been unable to contact the patient to offer or enroll patient in care coordination services  Encounter Outcome:  No Answer  [place]: [phone]

## 2022-11-24 NOTE — Progress Notes (Signed)
  Care Coordination   Note   11/24/2022 Name: Sam Wunschel MRN: [id number] DOB: Jul 21, 1946  Damani Rando is a 77 y.o. year old female who sees [name], MD for primary care. [name] outreach by phone today to schedule referral with  care coordination services.  Ms. Gad was given information about Care Coordination services today including:   The Care Coordination services include support from the care team which includes your Nurse Coordinator, Clinical Social Worker, or Pharmacist.  The Care Coordination team is here to help remove barriers to the health concerns and goals most important to you. Care Coordination services are voluntary, and the patient may decline or stop services at any time by request to their care team member.   Care Coordination Consent Status: Patient agreed to services and verbal consent obtained.   Follow up plan:  Telephone appointment with care coordination team member scheduled for:  11/25/22  Encounter Outcome:  Pt. Scheduled  [place]  Direct Dial: [phone]

## 2022-11-25 ENCOUNTER — Ambulatory Visit: Payer: Self-pay

## 2022-11-25 NOTE — Patient Outreach (Signed)
  Care Coordination   Initial Visit Note   11/25/2022 Name: Elizabeth Schwartz MRN: [id number] DOB: September 27, 1946  Elizabeth Schwartz is a 77 y.o. year old female who sees [name], MD for primary care. I spoke with  [name] by phone today.  What matters to the patients health and wellness today?  I would like placement into Memory Care    Goals Addressed             This Visit's Progress    Care Coordination Activities - [facility] Placement       Care Coordination Interventions: Discussed patient currently lives in independent living and would like to move into Memory Care Patient is unable to afford placement without special assistance Medicaid Assessed for available family to assist with application process - patient denied family support Determined the patients ILF Coordinator Raymond Gurney assists patient at times; patient will ask her on Monday if Mrs. Linna Caprice is willing to assist with Medicaid application process Discussed the patient visited [place] and would like to move in to this location Advised the patient SW will contact [facility] to confirm bed availability and follow up on Tuesday 1/16 after patient has time to speak with Mrs. [place] and was advised there is only one female bed available at this time; voice message left for executive director Farrel Gobble requesting a return call to place patient on waiting list          SDOH assessments and interventions completed:  No     Care Coordination Interventions:  Yes, provided   Follow up plan: Follow up call scheduled for 11/29/22    Encounter Outcome:  Pt. Visit Completed   Daneen Schick, Arita Miss, CDP Social Worker, Certified Dementia Practitioner [place] [facility]  Care Coordination [phone]

## 2022-11-25 NOTE — Patient Instructions (Signed)
Visit Information  Thank you for taking time to visit with me today. Please don't hesitate to contact me if I can be of assistance to you.   Following are the goals we discussed today:   Goals Addressed             This Visit's Progress    Care Coordination Activities - [facility] Placement       Care Coordination Interventions: Discussed patient currently lives in independent living and would like to move into Memory Care Patient is unable to afford placement without special assistance Medicaid Assessed for available family to assist with application process - patient denied family support Determined the patients ILF Coordinator Raymond Gurney assists patient at times; patient will ask her on Monday if Mrs. Linna Caprice is willing to assist with Medicaid application process Discussed the patient visited [place] and would like to move in to this location Advised the patient SW will contact [facility] to confirm bed availability and follow up on Tuesday 1/16 after patient has time to speak with Mrs. [place] and was advised there is only one female bed available at this time; voice message left for executive director Farrel Gobble requesting a return call to place patient on waiting list          Our next appointment is by telephone on 11/29/22 at 10:00   Please call the care guide team at [phone] if you need to cancel or reschedule your appointment.   If you are experiencing a Mental Health or [place] or need someone to talk to, please call 911  The patient verbalized understanding of instructions, educational materials, and care plan provided today and DECLINED offer to receive copy of patient instructions, educational materials, and care plan.   Telephone follow up appointment with care management team member scheduled for:11/29/22  Daneen Schick, Arita Miss, CDP Social Worker, Certified Dementia Practitioner [place] [facility]  Care  Coordination [phone]

## 2022-11-29 ENCOUNTER — Telehealth: Payer: Self-pay

## 2022-11-29 ENCOUNTER — Ambulatory Visit: Payer: Self-pay

## 2022-11-29 NOTE — Patient Outreach (Signed)
  Care Coordination   Follow Up Visit Note   11/29/2022 Name: Elizabeth Schwartz MRN: [id number] DOB: 09-Jan-1946  [place] Carmack is a 77 y.o. year old female who sees [name], MD for primary care. I spoke with  [name] by phone today.  What matters to the patients health and wellness today?  I want placement    Goals Addressed             This Visit's Progress    Care Coordination Activities - [facility] Placement       Care Coordination Interventions: Contacted patient to determine she has not spoken with Raymond Gurney regarding assistance with completion of Medication Application for Memory Care placement Advised the patient SW did contact [facility] and they do not have an available bed; SW to follow up with placement options regarding bed availability Obtained permission from the patient to contact Raymond Gurney regarding placement needs and to assess if she is able to assist the patient with Medicaid application Performed chart review to note patient has a niece and nephew listed as emergency contacts - patient reports they "do not know what is going on" Advised the patient SW will attempt to contact [facility] and will begin looking for placement option Voice message left for Raymond Gurney [phone]) Resident Service Coordinator at [facility] requesting a return call        SDOH assessments and interventions completed:  No     Care Coordination Interventions:  Yes, provided   Follow up plan:  SW will continue to follow    Encounter Outcome:  Pt. Visit Completed   Daneen Schick, Arita Miss, CDP Social Worker, Certified Dementia Practitioner [facility] Care Management  Care Coordination [phone]

## 2022-11-29 NOTE — Patient Outreach (Signed)
  Care Coordination   11/29/2022 Name: Melvena Vink MRN: [id number] DOB: 11-15-1945   Care Coordination Outreach Attempts:  An unsuccessful telephone outreach was attempted for a scheduled appointment today.  Follow Up Plan:  Additional outreach attempts will be made to offer the patient care coordination information and services.   Encounter Outcome:  No Answer   Care Coordination Interventions:  No, not indicated    Daneen Schick, BSW, CDP Social Worker, Certified Dementia Practitioner [place] [facility]  Care Coordination [phone]

## 2022-11-29 NOTE — Patient Instructions (Signed)
Visit Information  Thank you for taking time to visit with me today. Please don't hesitate to contact me if I can be of assistance to you.   Following are the goals we discussed today:   Goals Addressed             This Visit's Progress    Care Coordination Activities - [facility] Placement       Care Coordination Interventions: Contacted patient to determine she has not spoken with Raymond Gurney regarding assistance with completion of Medication Application for Memory Care placement Advised the patient SW did contact [facility] and they do not have an available bed; SW to follow up with placement options regarding bed availability Obtained permission from the patient to contact Raymond Gurney regarding placement needs and to assess if she is able to assist the patient with Medicaid application Performed chart review to note patient has a niece and nephew listed as emergency contacts - patient reports they "do not know what is going on" Advised the patient SW will attempt to contact [facility] and will begin looking for placement option Voice message left for Raymond Gurney [phone]) Resident Service Coordinator at [facility] requesting a return call         If you are experiencing a Mental Health or [place] or need someone to talk to, please call 911  The patient verbalized understanding of instructions, educational materials, and care plan provided today and DECLINED offer to receive copy of patient instructions, educational materials, and care plan.   Daneen Schick, BSW, CDP Social Worker, Certified Dementia Practitioner [place] [facility]  Care Coordination [phone]

## 2022-12-01 ENCOUNTER — Ambulatory Visit: Payer: Self-pay

## 2022-12-01 NOTE — Patient Outreach (Signed)
  Care Coordination   Follow Up Visit Note   12/01/2022 Name: Elizabeth Schwartz MRN: [id number] DOB: Jul 29, 1946  Elizabeth Schwartz is a 77 y.o. year old female who sees Elizabeth Stain, MD for primary care. I  spoke with Elizabeth Schwartz.     Goals Addressed             This Visit's Progress    Care Coordination Activities - [facility] Placement       Care Coordination Interventions: Collaboration with Elizabeth Schwartz Resident Service Coordinator with [facility] apartments to discuss patients needs for increased level of care SW will follow up with the patient to review options to work with DSS Placement Coordinator; patient will need to spend down savings prior to qualifying for Medicaid        SDOH assessments and interventions completed:  No     Care Coordination Interventions:  Yes, provided   Follow up plan:  SW will continue to follow    Encounter Outcome:  Pt. Visit Completed   Elizabeth Schwartz, Elizabeth Schwartz, CDP Social Worker, Certified Dementia Practitioner [facility] Care Management  Care Coordination [phone]

## 2022-12-02 ENCOUNTER — Ambulatory Visit: Payer: Self-pay

## 2022-12-02 NOTE — Patient Outreach (Signed)
  Care Coordination   Collaboration  Visit Note   12/02/2022 Name: Elizabeth Schwartz MRN: [id number] DOB: 1946-04-05  Elizabeth Schwartz is a 77 y.o. year old female who sees Elizabeth Stain, MD for primary care. I  attempted to speak with a DSS Schwartz worker by phone.     Goals Addressed             This Visit's Progress    Care Coordination Activities - Elizabeth Schwartz       Care Coordination Interventions: Voice message left for DSS Schwartz worker  to return SW call regarding next steps for Schwartz. Unsure if patient will qualify for special assistance based on reported assets Voice message left for Elizabeth Schwartz intake to determine if there is an available bed. SW will also ask if they can accept patient as private pay and assist with transition to special assistance Medicaid once assets are spent down Attempted to contact the patient but was unsuccessful. Will attempt to contact the patient 1/22         SDOH assessments and interventions completed:  No     Care Coordination Interventions:  Yes, provided   Follow up plan:  SW will follow up on 1/22    Encounter Outcome:  No Answer   Elizabeth Schwartz, BSW, CDP Social Worker, Certified Dementia Practitioner [place] Coordination [phone]

## 2022-12-05 ENCOUNTER — Ambulatory Visit: Payer: Self-pay

## 2022-12-05 NOTE — Patient Outreach (Addendum)
  Care Coordination   Follow Up Visit Note   12/05/2022 Name: Yuliya Nova MRN: [id number] DOB: 1946-09-13  Nohea Kras is a 77 y.o. year old female who sees [name], MD for primary care. I  spoke with [name] by phone whom the patient has previously given SW permission to speak with regarding placement needs.  What matters to the patients health and wellness today?  Options for placement    Goals Addressed             This Visit's Progress    Care Coordination Activities - [facility] Placement       Care Coordination Interventions: Discussion with [name] advising the patient will need to make a decision on next steps regarding spending down assets as she does not currently qualify for Medicaid Reviewed patient is not able to have over two thousand dollars in assets in order to qualify for special assistance Medicaid Provided options for the patient including pre-paying for funeral to spend down funds, hiring private care in the home and then being placed once funds have run out, private pay placement Advised patient will benefit from involving her family as there are many complex decisions to make moving forward and patient will need support persons once placed to help make ongoing decisions Discussed plan for SW to follow up on Thursday 1/25 after Olivia Mackie has time to speak with patient about options Inbound call received from [place] with [facility] who advised there is availability for a shared room. Private pay rate for ALF is 4480 per month. Patient does not qualify for Special Assistance for ALF placement based on reported income and would have to apply for special assistance memory care placement. Unfortunately, based on assets patient does not qualify for special assistance at this time SW will follow up with Olivia Mackie 1/25 at 12:30 as planned         SDOH assessments and interventions completed:  No     Care Coordination Interventions:  Yes, provided   Follow  up plan: Follow up call scheduled for 12/08/22    Encounter Outcome:  Pt. Visit Completed   Daneen Schick, Arita Miss, CDP Social Worker, Certified Dementia Practitioner [place] [facility]  Care Coordination [phone]

## 2022-12-05 NOTE — Patient Instructions (Signed)
Visit Information  Thank you for taking time to visit with me today. Please don't hesitate to contact me if I can be of assistance to you.   Following are the goals we discussed today:   Goals Addressed             This Visit's Progress    Care Coordination Activities - [facility] Placement       Care Coordination Interventions: Discussion with [name] advising the patient will need to make a decision on next steps regarding spending down assets as she does not currently qualify for Medicaid Reviewed patient is not able to have over two thousand dollars in assets in order to qualify for special assistance Medicaid Provided options for the patient including pre-paying for funeral to spend down funds, hiring private care in the home and then being placed once funds have run out, private pay placement Advised patient will benefit from involving her family as there are many complex decisions to make moving forward and patient will need support persons once placed to help make ongoing decisions Discussed plan for SW to follow up on Thursday 1/25 after Olivia Mackie has time to speak with patient about options Inbound call received from [place] with [facility] who advised there is availability for a shared room. Private pay rate for ALF is 4480 per month. Patient does not qualify for Special Assistance for ALF placement based on reported income and would have to apply for special assistance memory care placement. Unfortunately, based on assets patient does not qualify for special assistance at this time SW will follow up with Olivia Mackie 1/25 at 12:30 as planned         Our next appointment is by telephone on 12/08/22  Please call the care guide team at [phone] if you need to cancel or reschedule your appointment.   If you are experiencing a Mental Health or [place] or need someone to talk to, please call 911  The patient verbalized understanding of instructions, educational  materials, and care plan provided today and DECLINED offer to receive copy of patient instructions, educational materials, and care plan.   Telephone follow up appointment with care management team member scheduled for:12/08/22  Daneen Schick, Arita Miss, CDP Social Worker, Certified Dementia Practitioner [place] [facility]  Care Coordination [phone]

## 2022-12-08 ENCOUNTER — Ambulatory Visit: Payer: Self-pay

## 2022-12-08 DIAGNOSIS — H40013 Open angle with borderline findings, low risk, bilateral: Secondary | ICD-10-CM | POA: Diagnosis not present

## 2022-12-08 NOTE — Patient Outreach (Signed)
  Care Coordination   Follow Up Visit Note   12/08/2022 Name: Elizabeth Schwartz MRN: [id number] DOB: 09/08/1946  Elizabeth Schwartz is a 77 y.o. year old female who sees [name], MD for primary care. I spoke with  [name] by phone today. I also spoke with [name] patients apartment manager by phone.  What matters to the patients health and wellness today?  Long term care planning    Goals Addressed             This Visit's Progress    COMPLETED: Care Coordination Activities - [facility] Placement       Care Coordination Interventions: Discussion with [name] reviewing patient will not qualify for special assistance Medicaid with amount of savings. Advised patient may pre-plan funeral in order to legally spend down funds as well as privately hire a caregiver in the home or move into a community private pay in order to spend down funds to qualify for Medicaid Reviewed the importance of patient involving her family with this planning as she will need assistance with decision making in the future based on cognitive decline Advised SW is able to assist with education on placement options and completion of an FL2 but SW is unable to force the patient to spend down her money in order to qualify for Medicaid  Determined Elizabeth Schwartz will speak with patient about options and assist with LTC Medicaid application as needed. Elizabeth Schwartz to contact providers office if/when an FL2 is needed Call completed with the patient who confirms Elizabeth Schwartz is assisting with next steps; encouraged the patient to contact her providers office as needed         SDOH assessments and interventions completed:  No     Care Coordination Interventions:  Yes, provided   Follow up plan: No further intervention required.   Encounter Outcome:  Pt. Visit Completed   Daneen Schick, BSW, CDP Social Worker, Certified Dementia Practitioner [place] [facility]  Care Coordination [phone]

## 2022-12-08 NOTE — Patient Instructions (Signed)
Visit Information  Thank you for taking time to visit with me today. Please don't hesitate to contact me if I can be of assistance to you.   Following are the goals we discussed today:   Goals Addressed             This Visit's Progress    COMPLETED: Care Coordination Activities - [facility] Placement       Care Coordination Interventions: Discussion with [name] reviewing patient will not qualify for special assistance Medicaid with amount of savings. Advised patient may pre-plan funeral in order to legally spend down funds as well as privately hire a caregiver in the home or move into a community private pay in order to spend down funds to qualify for Medicaid Reviewed the importance of patient involving her family with this planning as she will need assistance with decision making in the future based on cognitive decline Advised SW is able to assist with education on placement options and completion of an FL2 but SW is unable to force the patient to spend down her money in order to qualify for Medicaid  Determined Olivia Mackie will speak with patient about options and assist with LTC Medicaid application as needed. Olivia Mackie to contact providers office if/when an FL2 is needed Call completed with the patient who confirms Olivia Mackie is assisting with next steps; encouraged the patient to contact her providers office as needed         If you are experiencing a Mental Health or [place] or need someone to talk to, please call 911  The patient verbalized understanding of instructions, educational materials, and care plan provided today and DECLINED offer to receive copy of patient instructions, educational materials, and care plan.   No further follow up required: Please contact me as needed.  Daneen Schick, BSW, CDP Social Worker, Certified Dementia Practitioner [place] [facility]  Care Coordination [phone]

## 2022-12-20 ENCOUNTER — Ambulatory Visit (INDEPENDENT_AMBULATORY_CARE_PROVIDER_SITE_OTHER): Payer: 59 | Admitting: Physician Assistant

## 2022-12-20 VITALS — BP 117/77 | HR 116 | Resp 18 | Ht 63.0 in | Wt 163.0 lb

## 2022-12-20 DIAGNOSIS — F02818 Dementia in other diseases classified elsewhere, unspecified severity, with other behavioral disturbance: Secondary | ICD-10-CM

## 2022-12-20 MED ORDER — MEMANTINE HCL 5 MG PO TABS: ORAL_TABLET | ORAL | 3 refills | Status: DC

## 2022-12-20 NOTE — Progress Notes (Signed)
Assessment/Plan:   Memory Impairment   Elizabeth Schwartz is a very pleasant 77 y.o. RH female with a history of hypertension, hyperlipidemia, schizophrenia, DM2, anemia, COPD, depression, arthritis-osteoporosis, asthma, chronic diarrhea, presenting today in follow-up for evaluation of memory loss. Patient is on memantine 5 mg twice daily, tolerating well. 08/25/2022 MRI of the brain personally reviewed is remarkable for moderate chronic small vessel ischemic changes within the cerebral white matter and mild cerebral atrophy.    Recommendations:   Follow up in 6  months. Continue memantine 5 mg twice daily, side effects discussed Patient lives alone, recommending that independent living or other living facility for increased socialization and safety Continue to control mood as per PCP and follow  [place] [facility]  Recommended control of cardiovascular risk factors, patient is on baby aspirin daily.    Subjective:   This patient is here alone. Previous records as well as any outside records available were reviewed prior to todays visit.   Patient was last seen on 08/25/2022.  Last MoCA on 08/11/2022 was 12/30.  Any changes in memory since last visit? About the same. Able to remember conversations and names of people  repeats oneself?  Endorsed Disoriented when walking into a room?  Patient denies   Leaving objects in unusual places?  Patient denies   Wandering behavior?   denies   Any personality changes since last visit?  Patient has a history of schizophrenia, has  been seen by behavioral therapy in a long time, her mood is controlled by PCP and Psych  Any worsening depression?: denies   Hallucinations or paranoia?  denies   Seizures?   denies    Any sleep changes? Sleeps well.  Denies vivid dreams, REM behavior or sleepwalking   Sleep apnea?   denies   Any hygiene concerns?   denies   Independent of bathing and dressing?  Endorsed  Does the patient needs help with medications? Patient   is in charge   Who is in charge of the finances?  Patient is in charge     Any changes in appetite?  denies     Patient have trouble swallowing?  denies   Does the patient cook?  No  Any kitchen accidents such as leaving the stove on?   denies   Any headaches?    denies   Vision changes? denies Chronic back pain  denies   Ambulates with difficulty?   She has a history of chronic right> Left hip and leg pain due to arthritis, with subsequent limited mobility.  She does not use the rolator often and off, she is at risk for falls. Recent falls or head injuries?    denies     Unilateral weakness, numbness or tingling?   denies   Any tremors?  denies   Any anosmia?    denies   Any incontinence of urine?  Endorsed. She wears Depends Any bowel dysfunction?  She has a history of loose stools, she is followed by GI.      Patient lives  alone. She is thinking about moving to Assisted Living  (wait list) Does the patient drive? No issues driving   Initial visit 08/03/2022  How long did patient have memory difficulties? " Several months ago" Patient was initially placed on Aricept, and "cannot tell any difference in the memory ".  She has difficulty organizing her schedule, and forgets appointments, she has decreased concentration. She also has difficulty with TM  Patient lives with:  Patient lives  alone in a retirement community, lives alone.  She is divorced, she is estranged from her family, has no children. repeats oneself? Denies  Disoriented when walking into a room?  Patient denies   Leaving objects in unusual places?  Patient denies   Ambulates  with difficulty?   History of chronic right hip and left pain due to arthritis, which limited mobility. Does not use the walker often although she is at risk for falls   Recent falls?  Patient denies.  Last fall was on December 2022, with neck pain without fractures, no loss of consciousness Any head injuries?  Patient denies   History of seizures?    Patient denies   Wandering behavior?  Patient denies   Patient drives?   Denies any issues driving such as getting lost.   Any mood changes such irritability agitation?  Patient has history of schizophrenia controlled by PCP, has not seen [facility] in a long time Any history of depression?: Endorsed  Hallucinations?  Patient denies   Paranoia?  Patient denies   Patient reports that sleeps well without vivid dreams, REM behavior or sleepwalking    History of sleep apnea?  Patient denies   Any hygiene concerns?  Patient denies   Independent of bathing and dressing?  Endorsed  Does the patient needs help with medications?  Patient admits to having been placing the pills incorrectly on the pillbox.  Sometimes she gets confused of what medications to take or what they are for. In today's visit she thought that the memory medication was  Ramipril, which it is unclear if this has been discontinued and replaced by other agent.  Who is in charge of the finances?  Patient is in charge, she denies any issues  Any changes in appetite?  Patient denies   Patient have trouble swallowing? Patient denies   Does the patient cook?  Patient denies   Any kitchen accidents such as leaving the stove on? Patient denies   Any headaches?  Patient denies   Double vision? Patient denies   Any focal numbness or tingling?  Patient denies   Chronic back pain Patient denies   Unilateral weakness?  Patient denies   Any tremors?  Patient denies   Any history of anosmia? Denies  Any incontinence of urine? Endorsed, wears Depends Any bowel dysfunction?   History of loose stools, follows with GI History of heavy alcohol intake?  Patient denies   History of heavy tobacco use?  Patient denies   Family history of dementia?  Patient denies   Past Medical History:  Diagnosis Date   Anemia    COPD (chronic obstructive pulmonary disease) ([place])    Depression    Diabetes mellitus    GERD (gastroesophageal reflux disease)     Hyperlipidemia    Hypertension    Osteoporosis    Schizophrenia, schizo-affective ([place])    SOB (shortness of breath) on exertion    Syncope 06/11/2019     Past Surgical History:  Procedure Laterality Date   BREAST BIOPSY Right 2017   benign   LAPAROSCOPY  1975   ABD pain   VAGINAL HYSTERECTOMY  1975     PREVIOUS MEDICATIONS: Donepezil, discontinued due to GI symptoms  CURRENT MEDICATIONS:  Outpatient Encounter Medications as of 12/20/2022  Medication Sig   acetaminophen (TYLENOL) 500 MG tablet Take 500 mg by mouth 2 (two) times daily.   albuterol (VENTOLIN HFA) 108 (90 Base) MCG/ACT inhaler INHALE TWO PUFFS BY MOUTH EVERY 6 HOURS AS NEEDED  FOR SHORTNESS OF BREATH   alendronate (FOSAMAX) 70 MG tablet Take 1 tablet (70 mg total) by mouth once a week.   calcium carbonate (OS-CAL) 600 MG TABS Take 600 mg by mouth 2 (two) times daily with a meal.   clozapine (CLOZARIL) 200 MG tablet Take 1 tablet by mouth at bedtime.    diclofenac Sodium (VOLTAREN) 1 % GEL Apply twice a day to affected area   donepezil (ARICEPT) 10 MG tablet Take 1 tablet (10 mg total) by mouth at bedtime.   ezetimibe (ZETIA) 10 MG tablet Take 1 tablet (10 mg total) by mouth daily.   famotidine (PEPCID) 20 MG tablet Take 1 tablet (20 mg total) by mouth 2 (two) times daily.   fenofibrate 160 MG tablet Take 1 tablet (160 mg total) by mouth daily.   fish oil-omega-3 fatty acids 1000 MG capsule Take 1 g by mouth every morning.   Iron, Ferrous Sulfate, 325 (65 Fe) MG TABS Take 325 mg by mouth daily with breakfast.   memantine (NAMENDA) 5 MG tablet Take 1 tablet (5 mg at night) for 2 weeks, then increase to 1 tablet (5 mg) twice a day   metFORMIN (GLUCOPHAGE) 500 MG tablet Take 1 tablet (500 mg total) by mouth 2 (two) times daily with a meal.   Multiple Vitamins-Minerals (CENTRUM SILVER ULTRA WOMENS PO) Take 1 tablet by mouth daily.   OneTouch Delica Lancets 12I MISC Use to check blood sugar daily   ONETOUCH VERIO test strip  Use as instructed   SYMBICORT 160-4.5 MCG/ACT inhaler INHALE TWO PUFFS INTO THE LUNGS IN THE MORNING AND AT BEDTIME   vitamin C (ASCORBIC ACID) 500 MG tablet Take 500 mg by mouth every morning.   VITAMIN D, CHOLECALCIFEROL, PO Take 600 Units by mouth daily.   No facility-administered encounter medications on file as of 12/20/2022.     Objective:     PHYSICAL EXAMINATION:    VITALS:   Vitals:   12/20/22 1313  BP: 117/77  Pulse: (!) 116  Resp: 18  SpO2: 96%  Weight: 163 lb (73.9 kg)  Height: '5\' 3"'$  (1.6 m)    GEN:  The patient appears stated age and is in NAD. HEENT:  Normocephalic, atraumatic.   Neurological examination:  General: NAD, well-groomed, appears stated age. Orientation: The patient is alert. Oriented to person, place and date Cranial nerves: There is good facial symmetry.The speech is fluent and clear. No aphasia or dysarthria. Fund of knowledge is appropriate. Recent memory impaired and remote memory is normal.  Attention and concentration are normal.  Able to name objects and repeat phrases.  Hearing is intact to conversational tone.   Delayed recall *** Sensation: Sensation is intact to light touch throughout Motor: Strength is at least antigravity x4. Tremors: none  DTR's1/4 in UE/LE      08/11/2022   10:00 AM  Montreal Cognitive Assessment   Visuospatial/ Executive (0/5) 1  Naming (0/3) 3  Attention: Read list of digits (0/2) 1  Attention: Read list of letters (0/1) 1  Attention: Serial 7 subtraction starting at 100 (0/3) 1  Language: Repeat phrase (0/2) 1  Language : Fluency (0/1) 0  Abstraction (0/2) 1  Delayed Recall (0/5) 0  Orientation (0/6) 3  Total 12  Adjusted Score (based on education) 12        No data to display             Movement examination: Tone: There is normal tone in the UE/LE Abnormal movements:  no tremor.  No myoclonus.  No asterixis.   Coordination:  There is no decremation with RAM's. Normal finger to nose  Gait  and Station: The patient has no difficulty arising out of a deep-seated chair without the use of the hands. The patient's stride length is good.  Gait is cautious and narrow.   Thank you for allowing [place] the opportunity to participate in the care of this nice patient. Please do not hesitate to contact us for any questions or concerns.   Total time spent on today's visit was *** minutes dedicated to this patient today, preparing to see patient, examining the patient, ordering tests and/or medications and counseling the patient, documenting clinical information in the EHR or other health record, independently interpreting results and communicating results to the patient/family, discussing treatment and goals, answering patient's questions and coordinating care.  Cc:  [name], MD  Sharene Butters 12/20/2022 1:24 PM

## 2022-12-20 NOTE — Patient Instructions (Addendum)
It was a pleasure to see you today at our office.   Recommendations:  Continue memantine '5mg'$  tablets 1 tablet twice daily.    Follow up in 6 month  Whom to call:  Memory  decline, memory medications: Call our office [phone]   For psychiatric meds, mood meds: Please have your primary care physician  and psychiatry manage these medications.       For assessment of decision of mental capacity and competency:  Call Dr. Anthoney Harada, geriatric psychiatrist at [phone]  For guidance in geriatric dementia issues please call [facility] [phone]     If you have any severe symptoms of a stroke, or other severe issues such as confusion,severe chills or fever, etc call 911 or go to the ER as you may need to be evaluated further   Feel free to visit Facebook page " [email or web address]" for tips of how to care for people with memory problems.       RECOMMENDATIONS FOR ALL PATIENTS WITH MEMORY PROBLEMS: 1. Continue to exercise (Recommend 30 minutes of walking everyday, or 3 hours every week) 2. Increase social interactions - continue going to [place] and enjoy social gatherings with friends and family 3. Eat healthy, avoid fried foods and eat more fruits and vegetables 4. Maintain adequate blood pressure, blood sugar, and blood cholesterol level. Reducing the risk of stroke and cardiovascular disease also helps promoting better memory. 5. Avoid stressful situations. Live a simple life and avoid aggravations. Organize your time and prepare for the next day in anticipation. 6. Sleep well, avoid any interruptions of sleep and avoid any distractions in the bedroom that may interfere with adequate sleep quality 7. Avoid sugar, avoid sweets as there is a strong link between excessive sugar intake, diabetes, and cognitive impairment We discussed the Mediterranean diet, which has been shown to help patients reduce the risk of progressive memory disorders and reduces cardiovascular risk.  This includes eating fish, eat fruits and green leafy vegetables, nuts like almonds and hazelnuts, walnuts, and also use olive oil. Avoid fast foods and fried foods as much as possible. Avoid sweets and sugar as sugar use has been linked to worsening of memory function.  There is always a concern of gradual progression of memory problems. If this is the case, then we may need to adjust level of care according to patient needs. Support, both to the patient and caregiver, should then be put into place.         FALL PRECAUTIONS: Be cautious when walking. Scan the area for obstacles that may increase the risk of trips and falls. When getting up in the mornings, sit up at the edge of the bed for a few minutes before getting out of bed. Consider elevating the bed at the head end to avoid drop of blood pressure when getting up. Walk always in a well-lit room (use night lights in the walls). Avoid area rugs or power cords from appliances in the middle of the walkways. Use a walker or a cane if necessary and consider physical therapy for balance exercise. Get your eyesight checked regularly.  FINANCIAL OVERSIGHT: Supervision, especially oversight when making financial decisions or transactions is also recommended.  HOME SAFETY: Consider the safety of the kitchen when operating appliances like stoves, microwave oven, and blender. Consider having supervision and share cooking responsibilities until no longer able to participate in those. Accidents with firearms and other hazards in the house should be identified and addressed as well.   ABILITY  TO BE LEFT ALONE: If patient is unable to contact 911 operator, consider using LifeLine, or when the need is there, arrange for someone to stay with patients. Smoking is a fire hazard, consider supervision or cessation. Risk of wandering should be assessed by caregiver and if detected at any point, supervision and safe proof recommendations should be  instituted.  MEDICATION SUPERVISION: Inability to self-administer medication needs to be constantly addressed. Implement a mechanism to ensure safe administration of the medications.   DRIVING: Regarding driving, in patients with progressive memory problems, driving will be impaired. We advise to have someone else do the driving if trouble finding directions or if minor accidents are reported. Independent driving assessment is available to determine safety of driving.   If you are interested in the driving assessment, you can contact the following:  [employer] in [place]  [place] [phone] or [phone]    Point refers to food and lifestyle choices that are based on the traditions of countries located on the [employer]. This way of eating has been shown to help prevent certain conditions and improve outcomes for people who have chronic diseases, like kidney disease and heart disease. What are tips for following this plan? Lifestyle  Cook and eat meals together with your family, when possible. Drink enough fluid to keep your urine clear or pale yellow. Be physically active every day. This includes: Aerobic exercise like running or swimming. Leisure activities like gardening, walking, or housework. Get 7-8 hours of sleep each night. If recommended by your health care provider, drink red wine in moderation. This means 1 glass a day for nonpregnant women and 2 glasses a day for men. A glass of wine equals 5 oz (150 mL). Reading food labels  Check the serving size of packaged foods. For foods such as rice and pasta, the serving size refers to the amount of cooked product, not dry. Check the total fat in packaged foods. Avoid foods that have saturated fat or trans fats. Check the ingredients list for added sugars, such as  corn syrup. Shopping  At the grocery store, buy most of your food from the areas near the walls of the store. This includes: Fresh fruits and vegetables (produce). Grains, beans, nuts, and seeds. Some of these may be available in unpackaged forms or large amounts (in bulk). Fresh seafood. Poultry and eggs. Low-fat dairy products. Buy whole ingredients instead of prepackaged foods. Buy fresh fruits and vegetables in-season from local farmers markets. Buy frozen fruits and vegetables in resealable bags. If you do not have access to quality fresh seafood, buy precooked frozen shrimp or canned fish, such as tuna, salmon, or sardines. Buy small amounts of raw or cooked vegetables, salads, or olives from the deli or salad bar at your store. Stock your pantry so you always have certain foods on hand, such as olive oil, canned tuna, canned tomatoes, rice, pasta, and beans. Cooking  Cook foods with extra-virgin olive oil instead of using butter or other vegetable oils. Have meat as a side dish, and have vegetables or grains as your main dish. This means having meat in small portions or adding small amounts of meat to foods like pasta or stew. Use beans or vegetables instead of meat in common dishes like chili or lasagna. Experiment with different cooking methods. Try roasting or broiling vegetables instead of steaming or sauteing them. Add frozen vegetables to  soups, stews, pasta, or rice. Add nuts or seeds for added healthy fat at each meal. You can add these to yogurt, salads, or vegetable dishes. Marinate fish or vegetables using olive oil, lemon juice, garlic, and fresh herbs. Meal planning  Plan to eat 1 vegetarian meal one day each week. Try to work up to 2 vegetarian meals, if possible. Eat seafood 2 or more times a week. Have healthy snacks readily available, such as: Vegetable sticks with hummus. Greek yogurt. Fruit and nut trail mix. Eat balanced meals throughout the week. This  includes: Fruit: 2-3 servings a day Vegetables: 4-5 servings a day Low-fat dairy: 2 servings a day Fish, poultry, or lean meat: 1 serving a day Beans and legumes: 2 or more servings a week Nuts and seeds: 1-2 servings a day Whole grains: 6-8 servings a day Extra-virgin olive oil: 3-4 servings a day Limit red meat and sweets to only a few servings a month What are my food choices? Mediterranean diet Recommended Grains: Whole-grain pasta. Brown rice. Bulgar wheat. Polenta. Couscous. Whole-wheat bread. [place]. Vegetables: Artichokes. Beets. Broccoli. Cabbage. Carrots. Eggplant. Green beans. Chard. Kale. Spinach. Onions. Leeks. Peas. Squash. Tomatoes. Peppers. Radishes. Fruits: Apples. Apricots. Avocado. Berries. Bananas. Cherries. Dates. Figs. Grapes. Lemons. Melon. Oranges. Peaches. Plums. Pomegranate. Meats and other protein foods: Beans. Almonds. Sunflower seeds. Pine nuts. Peanuts. [place]. Salmon. Scallops. Shrimp. [place]. Tilapia. Clams. Oysters. Eggs. Dairy: Low-fat milk. Cheese. Greek yogurt. Beverages: Water. Red wine. Herbal tea. Fats and oils: Extra virgin olive oil. Avocado oil. Grape seed oil. Sweets and desserts: [place] yogurt with honey. Baked apples. Poached pears. Trail mix. Seasoning and other foods: Basil. Cilantro. Coriander. Cumin. Mint. Parsley. Sage. Rosemary. Tarragon. Garlic. Oregano. Thyme. Pepper. Balsalmic vinegar. Tahini. Hummus. Tomato sauce. Olives. Mushrooms. Limit these Grains: Prepackaged pasta or rice dishes. Prepackaged cereal with added sugar. Vegetables: Deep fried potatoes (french fries). Fruits: Fruit canned in syrup. Meats and other protein foods: Beef. Pork. Lamb. Poultry with skin. Hot dogs. Berniece Salines. Dairy: Ice cream. Sour cream. Whole milk. Beverages: Juice. Sugar-sweetened soft drinks. Beer. Liquor and spirits. Fats and oils: Butter. Canola oil. Vegetable oil. Beef fat (tallow). Lard. Sweets and desserts: Cookies. Cakes. Pies. Candy. Seasoning  and other foods: Mayonnaise. Premade sauces and marinades. The items listed may not be a complete list. Talk with your dietitian about what dietary choices are right for you. Summary The Mediterranean diet includes both food and lifestyle choices. Eat a variety of fresh fruits and vegetables, beans, nuts, seeds, and whole grains. Limit the amount of red meat and sweets that you eat. Talk with your health care provider about whether it is safe for you to drink red wine in moderation. This means 1 glass a day for nonpregnant women and 2 glasses a day for men. A glass of wine equals 5 oz (150 mL). This information is not intended to replace advice given to you by your health care provider. Make sure you discuss any questions you have with your health care provider. Document Released: 06/23/2016 Document Revised: 07/26/2016 Document Reviewed: 06/23/2016 Elsevier Interactive Patient Education  2017 [employer].   We have sent a referral to [facility] for your MRI and they will call you directly to schedule your appointment. They are located at [place]. If you need to contact them directly please call [phone].  Your provider has requested that you have labwork completed today. Please go to [facility] ([id number]) on the second floor of this building before leaving the  office today. You do not need to check in. If you are not called within 15 minutes please check with the front desk.

## 2022-12-21 ENCOUNTER — Other Ambulatory Visit: Payer: 59

## 2022-12-21 ENCOUNTER — Telehealth: Payer: Self-pay

## 2022-12-21 NOTE — Telephone Encounter (Signed)
Patient came to the office wondering if she needed blood work done, states she doesn't remember Clarise Cruz mentioning it but on AVS it says "Your provider has requested that you have labwork completed today. Please go to [facility] ([id number]) on the second floor of this building before leaving the office today. You do not need to check in. If you are not called within 15 minutes please check with the front desk"

## 2022-12-21 NOTE — Telephone Encounter (Signed)
No labs needed, I left message detailed on phone voicemail

## 2023-01-02 ENCOUNTER — Encounter: Payer: Self-pay | Admitting: Podiatry

## 2023-01-02 ENCOUNTER — Ambulatory Visit (INDEPENDENT_AMBULATORY_CARE_PROVIDER_SITE_OTHER): Payer: 59 | Admitting: Podiatry

## 2023-01-02 DIAGNOSIS — Q828 Other specified congenital malformations of skin: Secondary | ICD-10-CM

## 2023-01-02 DIAGNOSIS — M79675 Pain in left toe(s): Secondary | ICD-10-CM | POA: Diagnosis not present

## 2023-01-02 DIAGNOSIS — M79674 Pain in right toe(s): Secondary | ICD-10-CM | POA: Diagnosis not present

## 2023-01-02 DIAGNOSIS — B351 Tinea unguium: Secondary | ICD-10-CM | POA: Diagnosis not present

## 2023-01-02 DIAGNOSIS — E119 Type 2 diabetes mellitus without complications: Secondary | ICD-10-CM | POA: Diagnosis not present

## 2023-01-02 DIAGNOSIS — L608 Other nail disorders: Secondary | ICD-10-CM

## 2023-01-02 NOTE — Progress Notes (Signed)
This patient returns to my office for at risk foot care.  This patient requires this care by a professional since this patient will be at risk due to having diabetes.    She has pain on the outside border second toenail left.   She has callus under her big toe joint  both feet.  This patient presents for at risk foot care today.  General Appearance  Alert, conversant and in no acute stress.  Vascular  Dorsalis pedis and posterior tibial  pulses are palpable  bilaterally.  Capillary return is within normal limits  bilaterally. Temperature is within normal limits  bilaterally.  Neurologic  Senn-Weinstein monofilament wire test diminished  bilaterally. Muscle power within normal limits bilaterally.  Nails Pincer nail second toe left foot .  Long thick hallux nails.  No infection noted.  Orthopedic  No limitations of motion  feet .  No crepitus or effusions noted.  No bony pathology or digital deformities noted.  HAV  B/L.  Skin  normotropic skin with no porokeratosis noted bilaterally.  No signs of infections or ulcers noted.   Symptomatic callus sub 1  B/L.  Pincer second toenail left foot.  Callus  B/L.  Consent was obtained for treatment procedures.   Debridement of pincer nail and nail spicules with a nail nipper followed by dremel tool usage.     Return office visit      3 months              Told patient to return for periodic foot care and evaluation due to potential at risk complications.   Gardiner Barefoot DPM

## 2023-01-05 ENCOUNTER — Telehealth: Payer: Self-pay | Admitting: Emergency Medicine

## 2023-01-05 NOTE — Telephone Encounter (Signed)
Patient fell at a store, was told by store associates to come into and get xrays of her hip, shoulder and back, patient stated she did not choose to go to because she did not want to pay a ambulance fee

## 2023-01-06 ENCOUNTER — Telehealth: Payer: Self-pay

## 2023-01-06 NOTE — Telephone Encounter (Signed)
Pt called back and appointment was made

## 2023-01-06 NOTE — Telephone Encounter (Signed)
Called patient to follow up about fall and to make an appointment, vm was left    FYI to [facility]

## 2023-01-19 ENCOUNTER — Ambulatory Visit: Payer: 59 | Attending: Critical Care Medicine | Admitting: Critical Care Medicine

## 2023-01-19 ENCOUNTER — Encounter: Payer: Self-pay | Admitting: Critical Care Medicine

## 2023-01-19 ENCOUNTER — Telehealth: Payer: Self-pay

## 2023-01-19 VITALS — BP 86/51 | HR 102 | Ht 63.0 in | Wt 165.8 lb

## 2023-01-19 DIAGNOSIS — I10 Essential (primary) hypertension: Secondary | ICD-10-CM

## 2023-01-19 DIAGNOSIS — L608 Other nail disorders: Secondary | ICD-10-CM | POA: Diagnosis not present

## 2023-01-19 DIAGNOSIS — F02818 Dementia in other diseases classified elsewhere, unspecified severity, with other behavioral disturbance: Secondary | ICD-10-CM

## 2023-01-19 DIAGNOSIS — E119 Type 2 diabetes mellitus without complications: Secondary | ICD-10-CM

## 2023-01-19 DIAGNOSIS — T466X5A Adverse effect of antihyperlipidemic and antiarteriosclerotic drugs, initial encounter: Secondary | ICD-10-CM | POA: Diagnosis not present

## 2023-01-19 DIAGNOSIS — G720 Drug-induced myopathy: Secondary | ICD-10-CM

## 2023-01-19 DIAGNOSIS — J4540 Moderate persistent asthma, uncomplicated: Secondary | ICD-10-CM | POA: Diagnosis not present

## 2023-01-19 MED ORDER — MEMANTINE HCL 5 MG PO TABS: ORAL_TABLET | ORAL | 3 refills | Status: DC

## 2023-01-19 MED ORDER — ONETOUCH VERIO W/DEVICE KIT: PACK | 0 refills | Status: DC

## 2023-01-19 NOTE — Assessment & Plan Note (Signed)
Blood pressure not elevated not on medication we will observe off his blood pressure soft

## 2023-01-19 NOTE — Patient Instructions (Signed)
New glucose meter was sent  Will send medication neurology sent to your [place]  Return to see Dr. Joya Gaskins 2 months  We discussed assisted living

## 2023-01-19 NOTE — Assessment & Plan Note (Signed)
Controlled type 2 diabetes maintain metformin will reorder a One [employer]

## 2023-01-19 NOTE — Assessment & Plan Note (Addendum)
Patient agreeable to assisted living will continue Namenda and Aricept

## 2023-01-19 NOTE — Progress Notes (Signed)
Notes  Established Patient Office Visit  Subjective:  Patient ID: Elizabeth Schwartz, female    DOB: [birth year]  Age: 77 y.o. MRN: [id number]  CC:  Primary care follow-up  HPI 09/15/21 Elizabeth Schwartz presents for primary care follow-up.  She has an issue with loose stools and bowel incontinence.  She is having to wear depends now.  She states there is mucus in the diarrhea.  Note we have worked this up in the past with stools for pathogens and this was negative.  Patient denies any nausea or vomiting or other GI complaints.  Patient also complains of chronic right hip and left knee pain.  She has not had any falls.  Patient also continues with her mental health medications.  She has history of diagnosed schizophrenia and neurocognitive disorder due to multiple etiologies.  Her diabetes is well controlled and on arrival hemoglobin A1c was 6.0.  Blood pressure is good at 117/67.  Patient's not had any flareups of her asthma as well.  Patient did have a Cologuard test and we did not receive the result the patient was told it was negative.  01/25/22 Patient returns in follow-up and has slight increase in memory difficulty otherwise doing reasonably well however on arrival blood pressure initially was elevated but on recheck was 124/84.  She did have a fall in November but has had no falls since that time it was because of a low blood pressure.  Her ramipril was discontinued she is off all blood pressure medicines now.  Blood sugar on arrival was 98.  She brings all her medications with her and we did a complete medication reconciliation at this visit.  The patient needs a Prevnar 20 vaccine and she will receive it.  She is up-to-date speed on all of her other primary care gaps Patient does have some chronic shoulder pain she has been followed by orthopedics plans to call them again she has had injections done  7/18 Patient seen in return follow-up he confirms that she did get severe myopathy with statins and  this is clearly documented in the chart.  She is taking in place of statins fish oil, Zetia, fenofibrate.  She is also trying to follow a healthier diet.  Patient is seen orthopedics recently had injection of the left shoulder with steroids this is improved her left shoulder pain.  She still has pain at the base of the neck.  On arrival blood pressure 124/76.  Her blood sugars at home have ranged 131 170. A1c is good at 5.9.  Patient does complain of some increased blurred vision.  She does have an eye doctor and she will need to see sooner.  Also history of iron deficiency and will need to have her renal function blood levels checked at this visit.  There are no other complaints.  She needs multiple medications refilled.  9/20  Patient seen in return follow-up from July visit on a short-term basis because of complaints of progression in memory loss.  Appears to be worse over the past several months.  She has been on Aricept she cannot tell this made any difference.  She has difficulty organizing her schedule and forgetting appointments.  She is doing her own pill organizer she claims she is putting the pills incorrectly out of the bottles.  She was however when we went over her medications somewhat confused as to what medications were for which purpose.  Note she is divorced from her husband she has no children her siblings are  all deceased she has 2 nieces living in [place] but they did not associate with her.  She does live in a retirement apartment [employer].  There is an Radiographer, therapeutic that organizes events in the community center for the residents.  The apartment manager was concerned about her mental status and did notify us of need for this visit.  Patient did agree to and received her flu vaccine at this visit.  There are no other complaints.  A1c at the last visit was good at 5.9.  Patient was in the ER in December for a fall but hadHas not her fall since that  time.  11/17/22 Patient seen in return follow-up she still in an independent living apartment.  Her mental status has remained about the same however her apartment manager feels she would benefit from assisted living the patient agrees.  She likely needs memory care.  A1c on arrival 6.2 blood pressure is 130/80.  Patient needs a handicap plate renewed.  She needs refills on multiple medications.  I did complete total medication reconciliation and all medications the medication list are accurate.  She does use a pill organizer.  There are no other complaints.  She has had no falls since last visit.  01/19/23 Patient seen in return follow-up she fell next to the [place] she bumped into another person on the sidewalk.  She injured her right toe.  No other injuries.  She denies other falls.  Today on arrival blood pressure soft 86/51.  She denies symptoms from this.  Recently saw neurology as documented below and they changed her to Namenda plus Aricept.  Patient is interested in assisted living and is going to tour one short-term.   Past Medical History:  Diagnosis Date   Anemia    COPD (chronic obstructive pulmonary disease) (HCC)    Depression    Diabetes mellitus    GERD (gastroesophageal reflux disease)    Hyperlipidemia    Hypertension    Osteoporosis    Schizophrenia, schizo-affective (HCC)    SOB (shortness of breath) on exertion    Syncope 06/11/2019    Past Surgical History:  Procedure Laterality Date   BREAST BIOPSY Right 2017   benign   LAPAROSCOPY  1975   ABD pain   VAGINAL HYSTERECTOMY  1975    Family History  Problem Relation Age of Onset   Breast cancer Neg Hx     Social History   Socioeconomic History   Marital status: Single    Spouse name: Not on file   Number of children: 0   Years of education: 12   Highest education level: Not on file  Occupational History   Not on file  Tobacco Use   Smoking status: Former    Years: 15.00    Types:  Cigarettes    Quit date: 02/28/2012    Years since quitting: 10.8   Smokeless tobacco: Former    Types: Nurse, children's Use: Never used  Substance and Sexual Activity   Alcohol use: Yes    Comment: "socially"   Drug use: No   Sexual activity: Not Currently  Other Topics Concern   Not on file  Social History Narrative   Right handed   Drinks caffeine   Apartment on 3rd floor   Retired   Investment banker, operational of Radio broadcast assistant Strain: Not on file  Food Insecurity: Not on file  Transportation Needs: Not on file  Physical  Activity: Not on file  Stress: Not on file  Social Connections: Not on file  Intimate Partner Violence: Not on file    Outpatient Medications Prior to Visit  Medication Sig Dispense Refill   acetaminophen (TYLENOL) 500 MG tablet Take 500 mg by mouth 2 (two) times daily.     albuterol (VENTOLIN HFA) 108 (90 Base) MCG/ACT inhaler INHALE TWO PUFFS BY MOUTH EVERY 6 HOURS AS NEEDED FOR SHORTNESS OF BREATH 18 g 3   alendronate (FOSAMAX) 70 MG tablet Take 1 tablet (70 mg total) by mouth once a week. 12 tablet 3   calcium carbonate (OS-CAL) 600 MG TABS Take 600 mg by mouth 2 (two) times daily with a meal.     clozapine (CLOZARIL) 200 MG tablet Take 1 tablet by mouth at bedtime.      diclofenac Sodium (VOLTAREN) 1 % GEL Apply twice a day to affected area 50 g 3   donepezil (ARICEPT) 10 MG tablet Take 1 tablet (10 mg total) by mouth at bedtime. 90 tablet 3   ezetimibe (ZETIA) 10 MG tablet Take 1 tablet (10 mg total) by mouth daily. 90 tablet 3   famotidine (PEPCID) 20 MG tablet Take 1 tablet (20 mg total) by mouth 2 (two) times daily. 180 tablet 0   fenofibrate 160 MG tablet Take 1 tablet (160 mg total) by mouth daily. 60 tablet 6   fish oil-omega-3 fatty acids 1000 MG capsule Take 1 g by mouth every morning.     Iron, Ferrous Sulfate, 325 (65 Fe) MG TABS Take 325 mg by mouth daily with breakfast. 60 tablet 4   metFORMIN (GLUCOPHAGE) 500 MG tablet  Take 1 tablet (500 mg total) by mouth 2 (two) times daily with a meal. 180 tablet 2   Multiple Vitamins-Minerals (CENTRUM SILVER ULTRA WOMENS PO) Take 1 tablet by mouth daily.     OneTouch Delica Lancets 99991111 MISC Use to check blood sugar daily 100 each 1   ONETOUCH VERIO test strip Use as instructed 100 each 2   SYMBICORT 160-4.5 MCG/ACT inhaler INHALE TWO PUFFS INTO THE LUNGS IN THE MORNING AND AT BEDTIME 10.2 g 2   vitamin C (ASCORBIC ACID) 500 MG tablet Take 500 mg by mouth every morning.     VITAMIN D, CHOLECALCIFEROL, PO Take 600 Units by mouth daily.     memantine (NAMENDA) 5 MG tablet Take 1 tablet  twice a day 180 tablet 3   No facility-administered medications prior to visit.    Allergies  Allergen Reactions   Crestor [Rosuvastatin]     Muscle aches.     ROS Review of Systems  Constitutional: Negative.   HENT: Negative.  Negative for ear pain, postnasal drip, rhinorrhea, sinus pressure, sore throat, trouble swallowing and voice change.   Eyes:  Positive for visual disturbance.  Respiratory: Negative.  Negative for apnea, cough, choking, chest tightness, shortness of breath, wheezing and stridor.   Cardiovascular: Negative.  Negative for chest pain, palpitations and leg swelling.  Gastrointestinal:  Negative for abdominal distention, abdominal pain, anal bleeding, blood in stool, constipation, diarrhea, nausea, rectal pain and vomiting.  Genitourinary: Negative.   Musculoskeletal:  Positive for arthralgias. Negative for myalgias.       Multiple joint pains  Skin: Negative.  Negative for rash.  Allergic/Immunologic: Negative.  Negative for environmental allergies and food allergies.  Neurological: Negative.  Negative for dizziness, tremors, seizures, syncope, facial asymmetry, speech difficulty, weakness, light-headedness, numbness and headaches.  Hematological: Negative.  Negative for adenopathy. Does  not bruise/bleed easily.  Psychiatric/Behavioral:  Positive for decreased  concentration. Negative for agitation, confusion, dysphoric mood, hallucinations, self-injury, sleep disturbance and suicidal ideas. The patient is not nervous/anxious and is not hyperactive.       Objective:    Physical Exam Vitals reviewed.  Constitutional:      Appearance: Normal appearance. She is well-developed. She is not diaphoretic.  HENT:     Head: Normocephalic and atraumatic.     Nose: Nose normal. No nasal deformity, septal deviation, mucosal edema or rhinorrhea.     Right Sinus: No maxillary sinus tenderness or frontal sinus tenderness.     Left Sinus: No maxillary sinus tenderness or frontal sinus tenderness.     Mouth/Throat:     Mouth: Mucous membranes are moist.     Pharynx: Oropharynx is clear. No oropharyngeal exudate.  Eyes:     General: No scleral icterus.       Right eye: No discharge.        Left eye: No discharge.     Extraocular Movements: Extraocular movements intact.     Conjunctiva/sclera: Conjunctivae normal.     Pupils: Pupils are equal, round, and reactive to light.     Comments: No visual field cuts  Neck:     Thyroid: No thyromegaly.     Vascular: No carotid bruit or JVD.     Trachea: Trachea normal. No tracheal tenderness or tracheal deviation.  Cardiovascular:     Rate and Rhythm: Normal rate and regular rhythm.     Chest Wall: PMI is not displaced.     Pulses: Normal pulses. No decreased pulses.     Heart sounds: Normal heart sounds, S1 normal and S2 normal. Heart sounds not distant. No murmur heard.    No systolic murmur is present.     No diastolic murmur is present.     No friction rub. No gallop. No S3 or S4 sounds.  Pulmonary:     Effort: No tachypnea, accessory muscle usage or respiratory distress.     Breath sounds: No stridor. No decreased breath sounds, wheezing, rhonchi or rales.  Chest:     Chest wall: No tenderness.  Abdominal:     General: Bowel sounds are normal. There is no distension.     Palpations: Abdomen is soft.  Abdomen is not rigid.     Tenderness: There is no abdominal tenderness. There is no guarding or rebound.  Musculoskeletal:        General: Normal range of motion.     Cervical back: Normal range of motion and neck supple. No edema, erythema or rigidity. No muscular tenderness. Normal range of motion.     Comments: No bruising or injury to the right great toe  Lymphadenopathy:     Head:     Right side of head: No submental or submandibular adenopathy.     Left side of head: No submental or submandibular adenopathy.     Cervical: No cervical adenopathy.  Skin:    General: Skin is warm and dry.     Coloration: Skin is not pale.     Findings: No rash.     Nails: There is no clubbing.  Neurological:     General: No focal deficit present.     Mental Status: She is alert and oriented to person, place, and time.     Cranial Nerves: Cranial nerves 2-12 are intact. No cranial nerve deficit.     Sensory: Sensation is intact. No sensory deficit.  Motor: Motor function is intact. No weakness.     Coordination: Coordination is intact. Coordination normal.     Gait: Gait is intact. Gait normal.     Deep Tendon Reflexes: Reflexes normal.  Psychiatric:        Attention and Perception: Attention and perception normal.        Mood and Affect: Mood and affect normal.        Speech: Speech normal.        Behavior: Behavior normal.        Thought Content: Thought content normal.        Cognition and Memory: Cognition is impaired. Memory is impaired. She exhibits impaired recent memory and impaired remote memory.        Judgment: Judgment normal.   Get up and go test was normal  BP (!) 86/51   Pulse (!) 102   Ht '5\' 3"'$  (1.6 m)   Wt 165 lb 12.8 oz (75.2 kg)   SpO2 97%   BMI 29.37 kg/m  Wt Readings from Last 3 Encounters:  01/19/23 165 lb 12.8 oz (75.2 kg)  12/20/22 163 lb (73.9 kg)  11/17/22 168 lb (76.2 kg)     Health Maintenance Due  Topic Date Due   Medicare Annual Wellness (AWV)   Never done   COVID-19 Vaccine (6 - 2023-24 season) 07/15/2022      There are no preventive care reminders to display for this patient.  Lab Results  Component Value Date   TSH 1.90 08/11/2022   Lab Results  Component Value Date   WBC 5.6 11/17/2022   HGB 10.8 (L) 11/17/2022   HCT 33.5 (L) 11/17/2022   MCV 92 11/17/2022   PLT 263 11/17/2022   Lab Results  Component Value Date   NA 141 11/17/2022   K 4.0 11/17/2022   CO2 22 11/17/2022   GLUCOSE 115 (H) 11/17/2022   BUN 21 11/17/2022   CREATININE 1.07 (H) 11/17/2022   BILITOT <0.2 11/17/2022   ALKPHOS 41 (L) 11/17/2022   AST 30 11/17/2022   ALT 31 11/17/2022   PROT 6.2 11/17/2022   ALBUMIN 4.3 11/17/2022   CALCIUM 9.2 11/17/2022   ANIONGAP 15 03/25/2019   EGFR 54 (L) 11/17/2022   Lab Results  Component Value Date   CHOL 188 05/31/2022   Lab Results  Component Value Date   HDL 54 05/31/2022   Lab Results  Component Value Date   LDLCALC 94 05/31/2022   Lab Results  Component Value Date   TRIG 239 (H) 05/31/2022   Lab Results  Component Value Date   CHOLHDL 3.5 05/31/2022   Lab Results  Component Value Date   HGBA1C 6.2 11/17/2022      Assessment & Plan:   Problem List Items Addressed This Visit       Cardiovascular and Mediastinum   Hypertension - Primary    Blood pressure not elevated not on medication we will observe off his blood pressure soft        Respiratory   Asthma, moderate persistent    Stable asthma continue inhalers        Endocrine   Diabetes type 2, controlled (HCC)    Controlled type 2 diabetes maintain metformin will reorder a One Touch Verio kit        Nervous and Auditory   Major neurocognitive disorder due to multiple etiologies with behavioral disturbance ([place])    Patient agreeable to assisted living will continue Namenda and Aricept  Relevant Medications   memantine (NAMENDA) 5 MG tablet     Musculoskeletal and Integument   Pincer nail deformity     Follow-up per podiatry      Statin myopathy    Cannot take statins continue with omega-3 and fenofibrate and Zetia        Other   Condition of having porphyrin in the blood ([place])   Meds ordered this encounter  Medications   Blood Glucose Monitoring Suppl (ONETOUCH VERIO) w/Device KIT    Sig: Use to check blood sugar    Dispense:  1 kit    Refill:  0   memantine (NAMENDA) 5 MG tablet    Sig: Take 1 tablet  twice a day    Dispense:  180 tablet    Refill:  3   Follow-up: Return in about 2 months (around 03/21/2023) for primary care follow up, chronic conditions.    Asencion Noble, MD

## 2023-01-19 NOTE — Progress Notes (Signed)
Injury to big right toe from fall a couple of weeks ago.

## 2023-01-19 NOTE — Telephone Encounter (Signed)
I met with the patient when she was in the clinic and she explained that she is very interested [facility] but she has not been able to reach anyone there.    I called [facility] with the patient present and spoke to [employer].  She explained that they do not have any available beds- ALF or memory care, but she will put the patient on the wait list for ALF.  The patient provided Aniceto Boss with her contact information and I also left my contact information.  Aniceto Boss said she will share this information with [facility] Admissions.    Aniceto Boss has no idea how long the wait could be as thing change daily.  The patient was very pleased to have her name on the wait list.  The patient  also understands that she will need to address her finances with Pamala Hurry, she does not have Medicaid but said she is working on it.

## 2023-01-19 NOTE — Assessment & Plan Note (Signed)
Stable asthma continue inhalers

## 2023-01-19 NOTE — Assessment & Plan Note (Signed)
Cannot take statins continue with omega-3 and fenofibrate and Zetia

## 2023-01-19 NOTE — Assessment & Plan Note (Signed)
Follow-up per podiatry

## 2023-03-20 ENCOUNTER — Telehealth: Payer: Self-pay | Admitting: Critical Care Medicine

## 2023-03-20 NOTE — Telephone Encounter (Signed)
Copied from CRM [id number]. Topic: General - Other >> Mar 20, 2023  1:29 PM Carrielelia G wrote: Reason for CRM:  paper work regarding Lab test requisition forms should have been received regarding [facility] testing  (faxed over four pages that need to be filled out)

## 2023-03-21 NOTE — Telephone Encounter (Signed)
Will fax once paperwork is signed

## 2023-03-21 NOTE — Telephone Encounter (Signed)
Placed in providers folder

## 2023-03-22 ENCOUNTER — Ambulatory Visit: Payer: Medicare HMO | Attending: Critical Care Medicine

## 2023-03-22 VITALS — Ht 63.0 in | Wt 165.0 lb

## 2023-03-22 DIAGNOSIS — Z0000 Encounter for general adult medical examination without abnormal findings: Secondary | ICD-10-CM | POA: Diagnosis not present

## 2023-03-22 NOTE — Patient Instructions (Signed)
[name] , Thank you for taking time to come for your Medicare Wellness Visit. I appreciate your ongoing commitment to your health goals. Please review the following plan we discussed and let me know if I can assist you in the future.   These are the goals we discussed:  Goals      Remain active and independent safely        This is a list of the screening recommended for you and due dates:  Health Maintenance  Topic Date Due   COVID-19 Vaccine (6 - 2023-24 season) 07/15/2022   Hemoglobin A1C  05/18/2023   Eye exam for diabetics  06/04/2023   Flu Shot  06/15/2023   Complete foot exam   06/29/2023   Yearly kidney function blood test for diabetes  11/18/2023   Yearly kidney health urinalysis for diabetes  11/18/2023   Medicare Annual Wellness Visit  03/21/2024   DTaP/Tdap/Td vaccine (2 - Td or Tdap) 11/17/2032   Pneumonia Vaccine  Completed   DEXA scan (bone density measurement)  Completed   Zoster (Shingles) Vaccine  Completed   HPV Vaccine  Aged Out   Colon Cancer Screening  Discontinued   Hepatitis C Screening: USPSTF Recommendation to screen - Ages 70-79 yo.  Discontinued   Cologuard (Stool DNA test)  Discontinued    Advanced directives: We have a copy of your advanced directives available in your record should your provider ever need to access them.   Conditions/risks identified: Aim for 30 minutes of exercise or brisk walking, 6-8 glasses of water, and 5 servings of fruits and vegetables each day.  Next appointment: Follow up in one year for your annual wellness visit    Preventive Care 65 Years and Older, Female Preventive care refers to lifestyle choices and visits with your health care provider that can promote health and wellness. What does preventive care include? A yearly physical exam. This is also called an annual well check. Dental exams once or twice a year. Routine eye exams. Ask your health care provider how often you should have your eyes  checked. Personal lifestyle choices, including: Daily care of your teeth and gums. Regular physical activity. Eating a healthy diet. Avoiding tobacco and drug use. Limiting alcohol use. Practicing safe sex. Taking low-dose aspirin every day. Taking vitamin and mineral supplements as recommended by your health care provider. What happens during an annual well check? The services and screenings done by your health care provider during your annual well check will depend on your age, overall health, lifestyle risk factors, and family history of disease. Counseling  Your health care provider may ask you questions about your: Alcohol use. Tobacco use. Drug use. Emotional well-being. Home and relationship well-being. Sexual activity. Eating habits. History of falls. Memory and ability to understand (cognition). Work and work Astronomer. Reproductive health. Screening  You may have the following tests or measurements: Height, weight, and BMI. Blood pressure. Lipid and cholesterol levels. These may be checked every 5 years, or more frequently if you are over 57 years old. Skin check. Lung cancer screening. You may have this screening every year starting at age 6 if you have a 30-pack-year history of smoking and currently smoke or have quit within the past 15 years. Fecal occult blood test (FOBT) of the stool. You may have this test every year starting at age 70. Flexible sigmoidoscopy or colonoscopy. You may have a sigmoidoscopy every 5 years or a colonoscopy every 10 years starting at age 72. Hepatitis C  blood test. Hepatitis B blood test. Sexually transmitted disease (STD) testing. Diabetes screening. This is done by checking your blood sugar (glucose) after you have not eaten for a while (fasting). You may have this done every 1-3 years. Bone density scan. This is done to screen for osteoporosis. You may have this done starting at age 65. Mammogram. This may be done every 1-2  years. Talk to your health care provider about how often you should have regular mammograms. Talk with your health care provider about your test results, treatment options, and if necessary, the need for more tests. Vaccines  Your health care provider may recommend certain vaccines, such as: Influenza vaccine. This is recommended every year. Tetanus, diphtheria, and acellular pertussis (Tdap, Td) vaccine. You may need a Td booster every 10 years. Zoster vaccine. You may need this after age 96. Pneumococcal 13-valent conjugate (PCV13) vaccine. One dose is recommended after age 53. Pneumococcal polysaccharide (PPSV23) vaccine. One dose is recommended after age 56. Talk to your health care provider about which screenings and vaccines you need and how often you need them. This information is not intended to replace advice given to you by your health care provider. Make sure you discuss any questions you have with your health care provider. Document Released: 11/27/2015 Document Revised: 07/20/2016 Document Reviewed: 09/01/2015 Elsevier Interactive Patient Education  2017 [place] Prevention in the Home Falls can cause injuries. They can happen to people of all ages. There are many things you can do to make your home safe and to help prevent falls. What can I do on the outside of my home? Regularly fix the edges of walkways and driveways and fix any cracks. Remove anything that might make you trip as you walk through a door, such as a raised step or threshold. Trim any bushes or trees on the path to your home. Use bright outdoor lighting. Clear any walking paths of anything that might make someone trip, such as rocks or tools. Regularly check to see if handrails are loose or broken. Make sure that both sides of any steps have handrails. Any raised decks and porches should have guardrails on the edges. Have any leaves, snow, or ice cleared regularly. Use sand or salt on walking paths  during winter. Clean up any spills in your garage right away. This includes oil or grease spills. What can I do in the bathroom? Use night lights. Install grab bars by the toilet and in the tub and shower. Do not use towel bars as grab bars. Use non-skid mats or decals in the tub or shower. If you need to sit down in the shower, use a plastic, non-slip stool. Keep the floor dry. Clean up any water that spills on the floor as soon as it happens. Remove soap buildup in the tub or shower regularly. Attach bath mats securely with double-sided non-slip rug tape. Do not have throw rugs and other things on the floor that can make you trip. What can I do in the bedroom? Use night lights. Make sure that you have a light by your bed that is easy to reach. Do not use any sheets or blankets that are too big for your bed. They should not hang down onto the floor. Have a firm chair that has side arms. You can use this for support while you get dressed. Do not have throw rugs and other things on the floor that can make you trip. What can I do in the kitchen?  Clean up any spills right away. Avoid walking on wet floors. Keep items that you use a lot in easy-to-reach places. If you need to reach something above you, use a strong step stool that has a grab bar. Keep electrical cords out of the way. Do not use floor polish or wax that makes floors slippery. If you must use wax, use non-skid floor wax. Do not have throw rugs and other things on the floor that can make you trip. What can I do with my stairs? Do not leave any items on the stairs. Make sure that there are handrails on both sides of the stairs and use them. Fix handrails that are broken or loose. Make sure that handrails are as long as the stairways. Check any carpeting to make sure that it is firmly attached to the stairs. Fix any carpet that is loose or worn. Avoid having throw rugs at the top or bottom of the stairs. If you do have throw  rugs, attach them to the floor with carpet tape. Make sure that you have a light switch at the top of the stairs and the bottom of the stairs. If you do not have them, ask someone to add them for you. What else can I do to help prevent falls? Wear shoes that: Do not have high heels. Have rubber bottoms. Are comfortable and fit you well. Are closed at the toe. Do not wear sandals. If you use a stepladder: Make sure that it is fully opened. Do not climb a closed stepladder. Make sure that both sides of the stepladder are locked into place. Ask someone to hold it for you, if possible. Clearly mark and make sure that you can see: Any grab bars or handrails. First and last steps. Where the edge of each step is. Use tools that help you move around (mobility aids) if they are needed. These include: Canes. Walkers. Scooters. Crutches. Turn on the lights when you go into a dark area. Replace any light bulbs as soon as they burn out. Set up your furniture so you have a clear path. Avoid moving your furniture around. If any of your floors are uneven, fix them. If there are any pets around you, be aware of where they are. Review your medicines with your doctor. Some medicines can make you feel dizzy. This can increase your chance of falling. Ask your doctor what other things that you can do to help prevent falls. This information is not intended to replace advice given to you by your health care provider. Make sure you discuss any questions you have with your health care provider. Document Released: 08/27/2009 Document Revised: 04/07/2016 Document Reviewed: 12/05/2014 Elsevier Interactive Patient Education  2017 [employer].

## 2023-03-22 NOTE — Progress Notes (Signed)
Subjective:   Elizabeth Schwartz is a 77 y.o. female who presents for an Initial Medicare Annual Wellness Visit.  I connected with  [name] on 03/22/23 by a audio enabled telemedicine application and verified that I am speaking with the correct person using two identifiers.  Patient Location: Home  Provider Location: Home Office  I discussed the limitations of evaluation and management by telemedicine. The patient expressed understanding and agreed to proceed.   Review of Systems     Cardiac Risk Factors include: advanced age (>68men, >73 women);diabetes mellitus;dyslipidemia;hypertension     Objective:    Today's Vitals   03/22/23 1035  Weight: 165 lb (74.8 kg)  Height: 5\' 3"  (1.6 m)   Body mass index is 29.23 kg/m.     03/22/2023   10:41 AM 12/20/2022    1:15 PM 09/19/2022   11:45 AM 08/11/2022   10:14 AM 10/18/2021   11:48 AM 03/25/2019   11:20 AM 03/01/2019    3:40 PM  Advanced Directives  Does Patient Have a Medical Advance Directive? Yes Yes Yes Yes No No No  Type of Estate agent of [place];Living will        Does patient want to make changes to medical advance directive? No - Patient declined        Copy of Healthcare Power of Attorney in Chart? Yes - validated most recent copy scanned in chart (See row information)        Would patient like information on creating a medical advance directive?     No - Patient declined No - Patient declined No - Patient declined    Current Medications (verified) Outpatient Encounter Medications as of 03/22/2023  Medication Sig   acetaminophen (TYLENOL) 500 MG tablet Take 500 mg by mouth 2 (two) times daily.   albuterol (VENTOLIN HFA) 108 (90 Base) MCG/ACT inhaler INHALE TWO PUFFS BY MOUTH EVERY 6 HOURS AS NEEDED FOR SHORTNESS OF BREATH   alendronate (FOSAMAX) 70 MG tablet Take 1 tablet (70 mg total) by mouth once a week.   Blood Glucose Monitoring Suppl (ONETOUCH VERIO) w/Device KIT Use to check blood sugar    calcium carbonate (OS-CAL) 600 MG TABS Take 600 mg by mouth 2 (two) times daily with a meal.   clozapine (CLOZARIL) 200 MG tablet Take 1 tablet by mouth at bedtime.    diclofenac Sodium (VOLTAREN) 1 % GEL Apply twice a day to affected area   donepezil (ARICEPT) 10 MG tablet Take 1 tablet (10 mg total) by mouth at bedtime.   ezetimibe (ZETIA) 10 MG tablet Take 1 tablet (10 mg total) by mouth daily.   famotidine (PEPCID) 20 MG tablet Take 1 tablet (20 mg total) by mouth 2 (two) times daily.   fenofibrate 160 MG tablet Take 1 tablet (160 mg total) by mouth daily.   fish oil-omega-3 fatty acids 1000 MG capsule Take 1 g by mouth every morning.   Iron, Ferrous Sulfate, 325 (65 Fe) MG TABS Take 325 mg by mouth daily with breakfast.   memantine (NAMENDA) 5 MG tablet Take 1 tablet  twice a day   metFORMIN (GLUCOPHAGE) 500 MG tablet Take 1 tablet (500 mg total) by mouth 2 (two) times daily with a meal.   Multiple Vitamins-Minerals (CENTRUM SILVER ULTRA WOMENS PO) Take 1 tablet by mouth daily.   OneTouch Delica Lancets 33G MISC Use to check blood sugar daily   ONETOUCH VERIO test strip Use as instructed   SYMBICORT 160-4.5 MCG/ACT inhaler INHALE TWO  PUFFS INTO THE LUNGS IN THE MORNING AND AT BEDTIME   vitamin C (ASCORBIC ACID) 500 MG tablet Take 500 mg by mouth every morning.   VITAMIN D, CHOLECALCIFEROL, PO Take 600 Units by mouth daily.   No facility-administered encounter medications on file as of 03/22/2023.    Allergies (verified) Crestor [rosuvastatin]   History: Past Medical History:  Diagnosis Date   Anemia    COPD (chronic obstructive pulmonary disease) (HCC)    Depression    Diabetes mellitus    GERD (gastroesophageal reflux disease)    Hyperlipidemia    Hypertension    Osteoporosis    Schizophrenia, schizo-affective (HCC)    SOB (shortness of breath) on exertion    Syncope 06/11/2019   Past Surgical History:  Procedure Laterality Date   BREAST BIOPSY Right 2017   benign    LAPAROSCOPY  1975   ABD pain   VAGINAL HYSTERECTOMY  1975   Family History  Problem Relation Age of Onset   Breast cancer Neg Hx    Social History   Socioeconomic History   Marital status: Single    Spouse name: Not on file   Number of children: 0   Years of education: 12   Highest education level: Not on file  Occupational History   Not on file  Tobacco Use   Smoking status: Former    Years: 15    Types: Cigarettes    Quit date: 02/28/2012    Years since quitting: 11.0   Smokeless tobacco: Former    Types: Associate Professor Use: Never used  Substance and Sexual Activity   Alcohol use: Yes    Comment: "socially"   Drug use: No   Sexual activity: Not Currently  Other Topics Concern   Not on file  Social History Narrative   Right handed   Drinks caffeine   Retired   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)   Social Determinants of Health   Financial Resource Strain: Low Risk  (03/22/2023)   Overall Financial Resource Strain (CARDIA)    Difficulty of Paying Living Expenses: Not hard at all  Food Insecurity: No Food Insecurity (03/22/2023)   Hunger Vital Sign    Worried About Running Out of Food in the Last Year: Never true    Ran Out of Food in the Last Year: Never true  Transportation Needs: No Transportation Needs (03/22/2023)   PRAPARE - Administrator, Civil Service (Medical): No    Lack of Transportation (Non-Medical): No  Physical Activity: Insufficiently Active (03/22/2023)   Exercise Vital Sign    Days of Exercise per Week: 3 days    Minutes of Exercise per Session: 30 min  Stress: No Stress Concern Present (03/22/2023)   [employer] of Occupational Health - Occupational Stress Questionnaire    Feeling of Stress : Not at all  Social Connections: Socially Isolated (03/22/2023)   Social Connection and Isolation Panel [NHANES]    Frequency of Communication with Friends and Family: Three times a week    Frequency  of Social Gatherings with Friends and Family: Never    Attends Religious Services: Never    Database administrator or Organizations: No    Attends Engineer, structural: Never    Marital Status: Never married    Tobacco Counseling Counseling given: Not Answered   Clinical Intake:  Pre-visit preparation completed: Yes  Pain : No/denies pain     Diabetes: Yes CBG  done?: No Did pt. bring in CBG monitor from home?: No  How often do you need to have someone help you when you read instructions, pamphlets, or other written materials from your doctor or pharmacy?: 1 - Never  Diabetic?Yes  Nutrition Risk Assessment:  Has the patient had any N/V/D within the last 2 months?  No  Does the patient have any non-healing wounds?  No  Has the patient had any unintentional weight loss or weight gain?  No   Diabetes:  Is the patient diabetic?  Yes  If diabetic, was a CBG obtained today?  No  Did the patient bring in their glucometer from home?  No  How often do you monitor your CBG's? As needed .   Financial Strains and Diabetes Management:  Are you having any financial strains with the device, your supplies or your medication? No .  Does the patient want to be seen by Chronic Care Management for management of their diabetes?  No  Would the patient like to be referred to a Nutritionist or for Diabetic Management?  No   Diabetic Exams:  Diabetic Eye Exam: Completed 06/03/22 Diabetic Foot Exam: Completed 06/28/22   Interpreter Needed?: No  Information entered by :: Kandis Fantasia LPN   Activities of Daily Living    03/22/2023   10:40 AM  In your present state of health, do you have any difficulty performing the following activities:  Hearing? 0  Vision? 0  Difficulty concentrating or making decisions? 1  Walking or climbing stairs? 0  Dressing or bathing? 0  Doing errands, shopping? 0  Preparing Food and eating ? N  Using the Toilet? N  In the past six months,  have you accidently leaked urine? N  Do you have problems with loss of bowel control? N  Managing your Medications? Y  Managing your Finances? N  Housekeeping or managing your Housekeeping? N    Patient Care Team: Storm Frisk, MD as PCP - General (Pulmonary Disease)  Indicate any recent Medical Services you may have received from other than [facility] providers in the past year (date may be approximate).     Assessment:   This is a routine wellness examination for Elizabeth Schwartz.  Hearing/Vision screen Hearing Screening - Comments:: Denies hearing difficulties   Vision Screening - Comments:: Wears rx glasses - up to date with routine eye exams with Dr. Shea Evans    Dietary issues and exercise activities discussed: Current Exercise Habits: Home exercise routine, Type of exercise: walking, Time (Minutes): 30, Frequency (Times/Week): 3, Weekly Exercise (Minutes/Week): 90, Intensity: Mild   Goals Addressed             This Visit's Progress    Remain active and independent safely        Depression Screen    03/22/2023   10:39 AM 01/19/2023   11:20 AM 11/17/2022    9:30 AM 05/31/2022    9:58 AM 01/25/2022   10:12 AM 09/15/2021    9:24 AM 03/22/2021    9:20 AM  PHQ 2/9 Scores  PHQ - 2 Score 0 0 0 0 0 0 0  PHQ- 9 Score  0 0    0    Fall Risk    03/22/2023   10:40 AM 01/19/2023   11:15 AM 12/20/2022    1:15 PM 11/17/2022    9:10 AM 09/19/2022   11:45 AM  Fall Risk   Falls in the past year? 0 1 0 0 0  Number falls in  past yr: 0 0 0 0 0  Injury with Fall? 0 1 0 0 0  Risk for fall due to : No Fall Risks No Fall Risks     Follow up Falls prevention discussed;Education provided;Falls evaluation completed Falls evaluation completed Falls evaluation completed Falls evaluation completed Falls evaluation completed    FALL RISK PREVENTION PERTAINING TO THE HOME:  Any stairs in or around the home? No  If so, are there any without handrails? No  Home free of loose throw rugs in walkways, pet beds,  electrical cords, etc? Yes  Adequate lighting in your home to reduce risk of falls? Yes   ASSISTIVE DEVICES UTILIZED TO PREVENT FALLS:  Life alert? No  Use of a cane, walker or w/c? No  Grab bars in the bathroom? Yes  Shower chair or bench in shower? No  Elevated toilet seat or a handicapped toilet? Yes   TIMED UP AND GO:  Was the test performed? No . Telephonic visit   Cognitive Function:      08/11/2022   10:00 AM  Montreal Cognitive Assessment   Visuospatial/ Executive (0/5) 1  Naming (0/3) 3  Attention: Read list of digits (0/2) 1  Attention: Read list of letters (0/1) 1  Attention: Serial 7 subtraction starting at 100 (0/3) 1  Language: Repeat phrase (0/2) 1  Language : Fluency (0/1) 0  Abstraction (0/2) 1  Delayed Recall (0/5) 0  Orientation (0/6) 3  Total 12  Adjusted Score (based on education) 12      03/22/2023   10:41 AM  6CIT Screen  What Year? 0 points  What month? 0 points  What time? 0 points  Count back from 20 0 points  Months in reverse 4 points  Repeat phrase 4 points  Total Score 8 points    Immunizations Immunization History  Administered Date(s) Administered   Fluad Quad(high Dose 65+) 08/03/2022   Influenza Whole 07/31/2011   Influenza, High Dose Seasonal PF 08/03/2018   Influenza,inj,Quad PF,6-35 Mos 08/14/2020   Influenza,inj,quad, With Preservative 07/31/2017   Influenza-Unspecified 09/01/2021   PFIZER(Purple Top)SARS-COV-2 Vaccination 12/24/2019, 01/21/2020, 08/10/2020, 05/19/2021   PNEUMOCOCCAL CONJUGATE-20 01/25/2022   Pfizer Covid-19 Vaccine Bivalent Booster 26yrs & up 11/02/2021   Tdap 11/17/2022   Zoster Recombinat (Shingrix) 09/04/2019, 08/26/2020    TDAP status: Up to date  Flu Vaccine status: Up to date  Pneumococcal vaccine status: Up to date  Covid-19 vaccine status: Information provided on how to obtain vaccines.   Qualifies for Shingles Vaccine? Yes   Zostavax completed No   Shingrix Completed?:  Yes  Screening Tests Health Maintenance  Topic Date Due   COVID-19 Vaccine (6 - 2023-24 season) 07/15/2022   HEMOGLOBIN A1C  05/18/2023   OPHTHALMOLOGY EXAM  06/04/2023   INFLUENZA VACCINE  06/15/2023   FOOT EXAM  06/29/2023   Diabetic kidney evaluation - eGFR measurement  11/18/2023   Diabetic kidney evaluation - Urine ACR  11/18/2023   Medicare Annual Wellness (AWV)  03/21/2024   DTaP/Tdap/Td (2 - Td or Tdap) 11/17/2032   Pneumonia Vaccine 43+ Years old  Completed   DEXA SCAN  Completed   Zoster Vaccines- Shingrix  Completed   HPV VACCINES  Aged Out   COLONOSCOPY (Pts 45-15yrs Insurance coverage will need to be confirmed)  Discontinued   Hepatitis C Screening  Discontinued   Fecal DNA (Cologuard)  Discontinued    Health Maintenance  Health Maintenance Due  Topic Date Due   COVID-19 Vaccine (6 - 2023-24 season) 07/15/2022  Colorectal cancer screening: No longer required.   Mammogram status: No longer required due to age and cognitive condition.  Bone Density status: Completed 07/07/17. Results reflect: Bone density results: OSTEOPENIA. Repeat every 2 years.  Lung Cancer Screening: (Low Dose CT Chest recommended if Age 87-80 years, 30 pack-year currently smoking OR have quit w/in 15years.) does not qualify.   Lung Cancer Screening Referral: n/a  Additional Screening:  Hepatitis C Screening: does qualify; Patient declines   Vision Screening: Recommended annual ophthalmology exams for early detection of glaucoma and other disorders of the eye. Is the patient up to date with their annual eye exam?  Yes  Who is the provider or what is the name of the office in which the patient attends annual eye exams? Dr.Dunn  If pt is not established with a provider, would they like to be referred to a provider to establish care? No .   Dental Screening: Recommended annual dental exams for proper oral hygiene  Community Resource Referral / Chronic Care Management: CRR required this  visit?  No   CCM required this visit?  No      Plan:     I have personally reviewed and noted the following in the patient's chart:   Medical and social history Use of alcohol, tobacco or illicit drugs  Current medications and supplements including opioid prescriptions. Patient is not currently taking opioid prescriptions. Functional ability and status Nutritional status Physical activity Advanced directives List of other physicians Hospitalizations, surgeries, and ER visits in previous 12 months Vitals Screenings to include cognitive, depression, and falls Referrals and appointments  In addition, I have reviewed and discussed with patient certain preventive protocols, quality metrics, and best practice recommendations. A written personalized care plan for preventive services as well as general preventive health recommendations were provided to patient.     Durwin Nora, [place]   11/19/1094   Due to this being a virtual visit, the after visit summary with patients personalized plan was offered to patient via mail or my-chart.  per request, patient was mailed a copy of AVS  Nurse Notes: Patient is to meet soon with manager at [facility] (ALF) to discuss moving there.  She is undecided on if this is what she wants to do.

## 2023-03-24 NOTE — Telephone Encounter (Signed)
Thyra Breed is calling back once again to check on the status of forms that were faxed over previously. They were just waiting for the doctors signature and were going to be faxed back. He states they still have not received those. Please assist as soon as possible.

## 2023-03-24 NOTE — Telephone Encounter (Signed)
FYI

## 2023-03-28 ENCOUNTER — Telehealth: Payer: Self-pay | Admitting: Critical Care Medicine

## 2023-03-28 NOTE — Telephone Encounter (Signed)
Called Elizabeth Schwartz, patient has appointment tomorrow and will fill/discus paperwork then

## 2023-03-28 NOTE — Telephone Encounter (Signed)
He is aware and verbalized understanding

## 2023-03-28 NOTE — Telephone Encounter (Signed)
Elizabeth Schwartz is calling back once again for forms that were faxed back on 5/6. He will refax them, Elizabeth Schwartz stated he really need those forms signed by provider and returned as soon as possible to [employer]. He can be reached at [phone]

## 2023-03-29 ENCOUNTER — Telehealth: Payer: Self-pay

## 2023-03-29 ENCOUNTER — Encounter: Payer: Self-pay | Admitting: Critical Care Medicine

## 2023-03-29 ENCOUNTER — Ambulatory Visit: Payer: Medicare HMO | Attending: Critical Care Medicine | Admitting: Critical Care Medicine

## 2023-03-29 VITALS — BP 133/81 | Temp 98.1°F | Ht 63.0 in | Wt 166.0 lb

## 2023-03-29 DIAGNOSIS — G720 Drug-induced myopathy: Secondary | ICD-10-CM | POA: Diagnosis not present

## 2023-03-29 DIAGNOSIS — F02818 Dementia in other diseases classified elsewhere, unspecified severity, with other behavioral disturbance: Secondary | ICD-10-CM

## 2023-03-29 DIAGNOSIS — E119 Type 2 diabetes mellitus without complications: Secondary | ICD-10-CM

## 2023-03-29 DIAGNOSIS — Z7984 Long term (current) use of oral hypoglycemic drugs: Secondary | ICD-10-CM | POA: Diagnosis not present

## 2023-03-29 DIAGNOSIS — E782 Mixed hyperlipidemia: Secondary | ICD-10-CM | POA: Diagnosis not present

## 2023-03-29 DIAGNOSIS — M79675 Pain in left toe(s): Secondary | ICD-10-CM

## 2023-03-29 DIAGNOSIS — T466X5A Adverse effect of antihyperlipidemic and antiarteriosclerotic drugs, initial encounter: Secondary | ICD-10-CM

## 2023-03-29 DIAGNOSIS — B351 Tinea unguium: Secondary | ICD-10-CM

## 2023-03-29 DIAGNOSIS — J4540 Moderate persistent asthma, uncomplicated: Secondary | ICD-10-CM | POA: Diagnosis not present

## 2023-03-29 DIAGNOSIS — M79674 Pain in right toe(s): Secondary | ICD-10-CM

## 2023-03-29 DIAGNOSIS — I10 Essential (primary) hypertension: Secondary | ICD-10-CM

## 2023-03-29 MED ORDER — DONEPEZIL HCL 10 MG PO TABS: 10.0000 mg | ORAL_TABLET | Freq: Every day | ORAL | 3 refills | Status: DC

## 2023-03-29 MED ORDER — ONETOUCH VERIO W/DEVICE KIT: PACK | 0 refills | Status: DC

## 2023-03-29 MED ORDER — METFORMIN HCL 500 MG PO TABS: 500.0000 mg | ORAL_TABLET | Freq: Two times a day (BID) | ORAL | 2 refills | Status: DC

## 2023-03-29 MED ORDER — FENOFIBRATE 160 MG PO TABS: 160.0000 mg | ORAL_TABLET | Freq: Every day | ORAL | 6 refills | Status: DC

## 2023-03-29 MED ORDER — FAMOTIDINE 20 MG PO TABS: 20.0000 mg | ORAL_TABLET | Freq: Two times a day (BID) | ORAL | 0 refills | Status: DC

## 2023-03-29 MED ORDER — EZETIMIBE 10 MG PO TABS: 10.0000 mg | ORAL_TABLET | Freq: Every day | ORAL | 3 refills | Status: DC

## 2023-03-29 MED ORDER — ALENDRONATE SODIUM 70 MG PO TABS: 70.0000 mg | ORAL_TABLET | ORAL | 3 refills | Status: DC

## 2023-03-29 NOTE — Assessment & Plan Note (Signed)
Statin myopathy continue with other medication

## 2023-03-29 NOTE — Assessment & Plan Note (Signed)
Cannot take statins due to statin myopathy continue other medication

## 2023-03-29 NOTE — Assessment & Plan Note (Signed)
Improved with management by podiatry

## 2023-03-29 NOTE — Telephone Encounter (Signed)
I met with the patient when she was in the clinic today.  Called Elizabeth Schwartz / [facility] eligibility Does not need personal care help

## 2023-03-29 NOTE — Assessment & Plan Note (Signed)
Continue with inhaled medication 

## 2023-03-29 NOTE — Assessment & Plan Note (Signed)
Controlled continue metformin needs another glucose meter

## 2023-03-29 NOTE — Assessment & Plan Note (Signed)
Blood pressure normal continue medication

## 2023-03-29 NOTE — Assessment & Plan Note (Signed)
No indication for genetic testing

## 2023-03-29 NOTE — Patient Instructions (Signed)
No LABS needed today You do NOT need genetic testing We are working on assisted living for you No change in medications, refills sent New glucose meter ordered  Return Dr Delford Field 4 months

## 2023-03-29 NOTE — Progress Notes (Signed)
Notes  Established Patient Office Visit  Subjective:  Patient ID: Elizabeth Schwartz, female    DOB: [birth year]  Age: 77 y.o. MRN: [id number]  CC:  Primary care follow-up  HPI 09/15/21 Elizabeth Schwartz presents for primary care follow-up.  She has an issue with loose stools and bowel incontinence.  She is having to wear depends now.  She states there is mucus in the diarrhea.  Note we have worked this up in the past with stools for pathogens and this was negative.  Patient denies any nausea or vomiting or other GI complaints.  Patient also complains of chronic right hip and left knee pain.  She has not had any falls.  Patient also continues with her mental health medications.  She has history of diagnosed schizophrenia and neurocognitive disorder due to multiple etiologies.  Her diabetes is well controlled and on arrival hemoglobin A1c was 6.0.  Blood pressure is good at 117/67.  Patient's not had any flareups of her asthma as well.  Patient did have a Cologuard test and we did not receive the result the patient was told it was negative.  01/25/22 Patient returns in follow-up and has slight increase in memory difficulty otherwise doing reasonably well however on arrival blood pressure initially was elevated but on recheck was 124/84.  She did have a fall in November but has had no falls since that time it was because of a low blood pressure.  Her ramipril was discontinued she is off all blood pressure medicines now.  Blood sugar on arrival was 98.  She brings all her medications with her and we did a complete medication reconciliation at this visit.  The patient needs a Prevnar 20 vaccine and she will receive it.  She is up-to-date speed on all of her other primary care gaps Patient does have some chronic shoulder pain she has been followed by orthopedics plans to call them again she has had injections done  7/18 Patient seen in return follow-up he confirms that she did get severe myopathy with statins and  this is clearly documented in the chart.  She is taking in place of statins fish oil, Zetia, fenofibrate.  She is also trying to follow a healthier diet.  Patient is seen orthopedics recently had injection of the left shoulder with steroids this is improved her left shoulder pain.  She still has pain at the base of the neck.  On arrival blood pressure 124/76.  Her blood sugars at home have ranged 131 170. A1c is good at 5.9.  Patient does complain of some increased blurred vision.  She does have an eye doctor and she will need to see sooner.  Also history of iron deficiency and will need to have her renal function blood levels checked at this visit.  There are no other complaints.  She needs multiple medications refilled.  9/20  Patient seen in return follow-up from July visit on a short-term basis because of complaints of progression in memory loss.  Appears to be worse over the past several months.  She has been on Aricept she cannot tell this made any difference.  She has difficulty organizing her schedule and forgetting appointments.  She is doing her own pill organizer she claims she is putting the pills incorrectly out of the bottles.  She was however when we went over her medications somewhat confused as to what medications were for which purpose.  Note she is divorced from her husband she has no children her siblings are  all deceased she has 2 nieces living in [place] but they did not associate with her.  She does live in a retirement apartment [employer].  There is an Health and safety inspector that organizes events in the community center for the residents.  The apartment manager was concerned about her mental status and did notify us of need for this visit.  Patient did agree to and received her flu vaccine at this visit.  There are no other complaints.  A1c at the last visit was good at 5.9.  Patient was in the ER in December for a fall but hadHas not her fall since that  time.  11/17/22 Patient seen in return follow-up she still in an independent living apartment.  Her mental status has remained about the same however her apartment manager feels she would benefit from assisted living the patient agrees.  She likely needs memory care.  A1c on arrival 6.2 blood pressure is 130/80.  Patient needs a handicap plate renewed.  She needs refills on multiple medications.  I did complete total medication reconciliation and all medications the medication list are accurate.  She does use a pill organizer.  There are no other complaints.  She has had no falls since last visit.  01/19/23 Patient seen in return follow-up she fell next to the [facility] she bumped into another person on the sidewalk.  She injured her right toe.  No other injuries.  She denies other falls.  Today on arrival blood pressure soft 86/51.  She denies symptoms from this.  Recently saw neurology as documented below and they changed her to Namenda plus Aricept.  Patient is interested in assisted living and is going to tour one short-term.  03/29/23 Patient seen in return follow-up.  She was sent a request for her to get genetic labs but this appears to be a scam and I did not sign the order.  She is out of several medications needs refills.  Her memory continues to be a challenge and she now agrees to go to memory care.  We tried to refer her to [facility] house but there may be a barrier due to cost and lack of beds.  Patient brought a urine sample she had placed in expired medication bottle we could not except this and there is no indication for this.  Patient clearly has continued memory issues and cannot keep up with her matters as she is writing notes all over different pages that are ill legible.  Past Medical History:  Diagnosis Date   Anemia    COPD (chronic obstructive pulmonary disease) (HCC)    Depression    Diabetes mellitus    GERD (gastroesophageal reflux disease)    Hyperlipidemia     Hypertension    Osteoporosis    Schizophrenia, schizo-affective (HCC)    SOB (shortness of breath) on exertion    Syncope 06/11/2019    Past Surgical History:  Procedure Laterality Date   BREAST BIOPSY Right 2017   benign   LAPAROSCOPY  1975   ABD pain   VAGINAL HYSTERECTOMY  1975    Family History  Problem Relation Age of Onset   Breast cancer Neg Hx     Social History   Socioeconomic History   Marital status: Single    Spouse name: Not on file   Number of children: 0   Years of education: 12   Highest education level: Not on file  Occupational History   Not on file  Tobacco  Use   Smoking status: Former    Years: 15    Types: Cigarettes    Quit date: 02/28/2012    Years since quitting: 11.0   Smokeless tobacco: Former    Types: Associate Professor Use: Never used  Substance and Sexual Activity   Alcohol use: Yes    Comment: "socially"   Drug use: No   Sexual activity: Not Currently  Other Topics Concern   Not on file  Social History Narrative   Right handed   Drinks caffeine   Retired   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)   Social Determinants of Health   Financial Resource Strain: Low Risk  (03/22/2023)   Overall Financial Resource Strain (CARDIA)    Difficulty of Paying Living Expenses: Not hard at all  Food Insecurity: No Food Insecurity (03/22/2023)   Hunger Vital Sign    Worried About Running Out of Food in the Last Year: Never true    Ran Out of Food in the Last Year: Never true  Transportation Needs: No Transportation Needs (03/22/2023)   PRAPARE - Administrator, Civil Service (Medical): No    Lack of Transportation (Non-Medical): No  Physical Activity: Insufficiently Active (03/22/2023)   Exercise Vital Sign    Days of Exercise per Week: 3 days    Minutes of Exercise per Session: 30 min  Stress: No Stress Concern Present (03/22/2023)   [employer] of Occupational Health - Occupational Stress  Questionnaire    Feeling of Stress : Not at all  Social Connections: Socially Isolated (03/22/2023)   Social Connection and Isolation Panel [NHANES]    Frequency of Communication with Friends and Family: Three times a week    Frequency of Social Gatherings with Friends and Family: Never    Attends Religious Services: Never    Database administrator or Organizations: No    Attends Banker Meetings: Never    Marital Status: Never married  Intimate Partner Violence: Not At Risk (03/22/2023)   Humiliation, Afraid, Rape, and Kick questionnaire    Fear of Current or Ex-Partner: No    Emotionally Abused: No    Physically Abused: No    Sexually Abused: No    Outpatient Medications Prior to Visit  Medication Sig Dispense Refill   acetaminophen (TYLENOL) 500 MG tablet Take 500 mg by mouth 2 (two) times daily.     albuterol (VENTOLIN HFA) 108 (90 Base) MCG/ACT inhaler INHALE TWO PUFFS BY MOUTH EVERY 6 HOURS AS NEEDED FOR SHORTNESS OF BREATH 18 g 3   calcium carbonate (OS-CAL) 600 MG TABS Take 600 mg by mouth 2 (two) times daily with a meal.     fish oil-omega-3 fatty acids 1000 MG capsule Take 1 g by mouth every morning.     Iron, Ferrous Sulfate, 325 (65 Fe) MG TABS Take 325 mg by mouth daily with breakfast. 60 tablet 4   Multiple Vitamins-Minerals (CENTRUM SILVER ULTRA WOMENS PO) Take 1 tablet by mouth daily.     OneTouch Delica Lancets 33G MISC Use to check blood sugar daily 100 each 1   ONETOUCH VERIO test strip Use as instructed 100 each 2   SYMBICORT 160-4.5 MCG/ACT inhaler INHALE TWO PUFFS INTO THE LUNGS IN THE MORNING AND AT BEDTIME 10.2 g 2   vitamin C (ASCORBIC ACID) 500 MG tablet Take 500 mg by mouth every morning.     VITAMIN D, CHOLECALCIFEROL, PO Take 600 Units by mouth  daily.     alendronate (FOSAMAX) 70 MG tablet Take 1 tablet (70 mg total) by mouth once a week. 12 tablet 3   Blood Glucose Monitoring Suppl (ONETOUCH VERIO) w/Device KIT Use to check blood sugar 1 kit 0    clozapine (CLOZARIL) 200 MG tablet Take 1 tablet by mouth at bedtime.      donepezil (ARICEPT) 10 MG tablet Take 1 tablet (10 mg total) by mouth at bedtime. 90 tablet 3   ezetimibe (ZETIA) 10 MG tablet Take 1 tablet (10 mg total) by mouth daily. 90 tablet 3   famotidine (PEPCID) 20 MG tablet Take 1 tablet (20 mg total) by mouth 2 (two) times daily. 180 tablet 0   fenofibrate 160 MG tablet Take 1 tablet (160 mg total) by mouth daily. 60 tablet 6   metFORMIN (GLUCOPHAGE) 500 MG tablet Take 1 tablet (500 mg total) by mouth 2 (two) times daily with a meal. 180 tablet 2   diclofenac Sodium (VOLTAREN) 1 % GEL Apply twice a day to affected area (Patient not taking: Reported on 03/29/2023) 50 g 3   memantine (NAMENDA) 5 MG tablet Take 1 tablet  twice a day (Patient not taking: Reported on 03/29/2023) 180 tablet 3   No facility-administered medications prior to visit.    Allergies  Allergen Reactions   Crestor [Rosuvastatin]     Muscle aches.     ROS Review of Systems  Constitutional: Negative.   HENT: Negative.  Negative for ear pain, postnasal drip, rhinorrhea, sinus pressure, sore throat, trouble swallowing and voice change.   Eyes:  Positive for visual disturbance.  Respiratory: Negative.  Negative for apnea, cough, choking, chest tightness, shortness of breath, wheezing and stridor.   Cardiovascular: Negative.  Negative for chest pain, palpitations and leg swelling.  Gastrointestinal:  Negative for abdominal distention, abdominal pain, anal bleeding, blood in stool, constipation, diarrhea, nausea, rectal pain and vomiting.  Genitourinary: Negative.   Musculoskeletal:  Positive for arthralgias. Negative for myalgias.       Multiple joint pains  Skin: Negative.  Negative for rash.  Allergic/Immunologic: Negative.  Negative for environmental allergies and food allergies.  Neurological: Negative.  Negative for dizziness, tremors, seizures, syncope, facial asymmetry, speech difficulty, weakness,  light-headedness, numbness and headaches.       Poor short-term memory  Hematological: Negative.  Negative for adenopathy. Does not bruise/bleed easily.  Psychiatric/Behavioral:  Positive for decreased concentration. Negative for agitation, confusion, dysphoric mood, hallucinations, self-injury, sleep disturbance and suicidal ideas. The patient is not nervous/anxious and is not hyperactive.       Objective:    Physical Exam Vitals reviewed.  Constitutional:      Appearance: Normal appearance. She is well-developed. She is not diaphoretic.  HENT:     Head: Normocephalic and atraumatic.     Nose: Nose normal. No nasal deformity, septal deviation, mucosal edema or rhinorrhea.     Right Sinus: No maxillary sinus tenderness or frontal sinus tenderness.     Left Sinus: No maxillary sinus tenderness or frontal sinus tenderness.     Mouth/Throat:     Mouth: Mucous membranes are moist.     Pharynx: Oropharynx is clear. No oropharyngeal exudate.  Eyes:     General: No scleral icterus.       Right eye: No discharge.        Left eye: No discharge.     Extraocular Movements: Extraocular movements intact.     Conjunctiva/sclera: Conjunctivae normal.     Pupils: Pupils are  equal, round, and reactive to light.     Comments: No visual field cuts  Neck:     Thyroid: No thyromegaly.     Vascular: No carotid bruit or JVD.     Trachea: Trachea normal. No tracheal tenderness or tracheal deviation.  Cardiovascular:     Rate and Rhythm: Normal rate and regular rhythm.     Chest Wall: PMI is not displaced.     Pulses: Normal pulses. No decreased pulses.     Heart sounds: Normal heart sounds, S1 normal and S2 normal. Heart sounds not distant. No murmur heard.    No systolic murmur is present.     No diastolic murmur is present.     No friction rub. No gallop. No S3 or S4 sounds.  Pulmonary:     Effort: No tachypnea, accessory muscle usage or respiratory distress.     Breath sounds: No stridor. No  decreased breath sounds, wheezing, rhonchi or rales.  Chest:     Chest wall: No tenderness.  Abdominal:     General: Bowel sounds are normal. There is no distension.     Palpations: Abdomen is soft. Abdomen is not rigid.     Tenderness: There is no abdominal tenderness. There is no guarding or rebound.  Musculoskeletal:        General: Normal range of motion.     Cervical back: Normal range of motion and neck supple. No edema, erythema or rigidity. No muscular tenderness. Normal range of motion.     Comments: No bruising or injury to the right great toe  Lymphadenopathy:     Head:     Right side of head: No submental or submandibular adenopathy.     Left side of head: No submental or submandibular adenopathy.     Cervical: No cervical adenopathy.  Skin:    General: Skin is warm and dry.     Coloration: Skin is not pale.     Findings: No rash.     Nails: There is no clubbing.  Neurological:     General: No focal deficit present.     Mental Status: She is alert and oriented to person, place, and time.     Cranial Nerves: Cranial nerves 2-12 are intact. No cranial nerve deficit.     Sensory: Sensation is intact. No sensory deficit.     Motor: Motor function is intact. No weakness.     Coordination: Coordination is intact. Coordination normal.     Gait: Gait is intact. Gait normal.     Deep Tendon Reflexes: Reflexes normal.  Psychiatric:        Attention and Perception: Attention and perception normal.        Mood and Affect: Mood and affect normal.        Speech: Speech normal.        Behavior: Behavior normal.        Thought Content: Thought content normal.        Cognition and Memory: Cognition is impaired. Memory is impaired. She exhibits impaired recent memory and impaired remote memory.        Judgment: Judgment normal.   Get up and go test was normal  BP 133/81 (BP Location: Left Arm, Patient Position: Sitting, Cuff Size: Normal)   Temp 98.1 F (36.7 C) (Oral)   Ht 5'  3" (1.6 m)   Wt 166 lb (75.3 kg)   SpO2 95%   BMI 29.41 kg/m  Wt Readings from Last 3 Encounters:  03/29/23 166 lb (75.3 kg)  03/22/23 165 lb (74.8 kg)  01/19/23 165 lb 12.8 oz (75.2 kg)     Health Maintenance Due  Topic Date Due   COVID-19 Vaccine (6 - 2023-24 season) 07/15/2022      There are no preventive care reminders to display for this patient.  Lab Results  Component Value Date   TSH 1.90 08/11/2022   Lab Results  Component Value Date   WBC 5.6 11/17/2022   HGB 10.8 (L) 11/17/2022   HCT 33.5 (L) 11/17/2022   MCV 92 11/17/2022   PLT 263 11/17/2022   Lab Results  Component Value Date   NA 141 11/17/2022   K 4.0 11/17/2022   CO2 22 11/17/2022   GLUCOSE 115 (H) 11/17/2022   BUN 21 11/17/2022   CREATININE 1.07 (H) 11/17/2022   BILITOT <0.2 11/17/2022   ALKPHOS 41 (L) 11/17/2022   AST 30 11/17/2022   ALT 31 11/17/2022   PROT 6.2 11/17/2022   ALBUMIN 4.3 11/17/2022   CALCIUM 9.2 11/17/2022   ANIONGAP 15 03/25/2019   EGFR 54 (L) 11/17/2022   Lab Results  Component Value Date   CHOL 188 05/31/2022   Lab Results  Component Value Date   HDL 54 05/31/2022   Lab Results  Component Value Date   LDLCALC 94 05/31/2022   Lab Results  Component Value Date   TRIG 239 (H) 05/31/2022   Lab Results  Component Value Date   CHOLHDL 3.5 05/31/2022   Lab Results  Component Value Date   HGBA1C 6.2 11/17/2022      Assessment & Plan:   Problem List Items Addressed This Visit       Cardiovascular and Mediastinum   Hypertension    Blood pressure normal continue medication      Relevant Medications   ezetimibe (ZETIA) 10 MG tablet   fenofibrate 160 MG tablet     Respiratory   Asthma, moderate persistent    Continue with inhaled medication        Endocrine   Diabetes type 2, controlled (HCC)    Controlled continue metformin needs another glucose meter      Relevant Medications   metFORMIN (GLUCOPHAGE) 500 MG tablet     Nervous and  Auditory   Major neurocognitive disorder due to multiple etiologies with behavioral disturbance (HCC) - Primary    No change in medications will look for assisted living will discontinue Namenda due to lack of effect but continue Aricept      Relevant Medications   donepezil (ARICEPT) 10 MG tablet     Musculoskeletal and Integument   Pain due to onychomycosis of toenails of both feet    Improved with management by podiatry      Statin myopathy    Statin myopathy continue with other medication        Other   Mixed hyperlipidemia    Cannot take statins due to statin myopathy continue other medication      Relevant Medications   ezetimibe (ZETIA) 10 MG tablet   fenofibrate 160 MG tablet   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    Dispense:  12 tablet    Refill:  3   Blood Glucose Monitoring Suppl (ONETOUCH VERIO) w/Device  KIT    Sig: Use to check blood sugar    Dispense:  1 kit    Refill:  0   donepezil (ARICEPT) 10 MG tablet    Sig: Take 1 tablet (10 mg total) by mouth at bedtime.    Dispense:  90 tablet    Refill:  3   ezetimibe (ZETIA) 10 MG tablet    Sig: Take 1 tablet (10 mg total) by mouth daily.    Dispense:  90 tablet    Refill:  3   famotidine (PEPCID) 20 MG tablet    Sig: Take 1 tablet (20 mg total) by mouth 2 (two) times daily.    Dispense:  180 tablet    Refill:  0   fenofibrate 160 MG tablet    Sig: Take 1 tablet (160 mg total) by mouth daily.    Dispense:  60 tablet    Refill:  6   metFORMIN (GLUCOPHAGE) 500 MG tablet    Sig: Take 1 tablet (500 mg total) by mouth 2 (two) times daily with a meal.    Dispense:  180 tablet    Refill:  2   Follow-up: Return in about 4 months (around 07/30/2023) for primary care follow up, diabetes, htn, chronic conditions.     Shan Levans, MD

## 2023-03-29 NOTE — Assessment & Plan Note (Signed)
No change in medications will look for assisted living will discontinue Namenda due to lack of effect but continue Aricept

## 2023-03-30 ENCOUNTER — Telehealth: Payer: Self-pay | Admitting: Critical Care Medicine

## 2023-03-30 NOTE — Telephone Encounter (Signed)
Thyra Breed called once again to see why forms had not been signed and and sent back since patient was seen on yesterday. Please contact Thyra Breed if there is an issue at [phone]

## 2023-03-30 NOTE — Telephone Encounter (Signed)
I spoke to [place] / [employer]: [phone] and provided her with the patient's name and phone number.  She inquired about payor and I explained that she has not qualified for Medicaid in the past and has [employer] so she would be private pay at least initially.  [name] said she may have a bed available at the end of this week, or Monday next week.    I also send a message to the DSS caseworkers requesting they contact the patient about potential Medicaid eligibility.

## 2023-03-31 NOTE — Telephone Encounter (Signed)
Called Elizabeth Schwartz stating paperwork will not be filled out and they are requesting a reason

## 2023-04-04 ENCOUNTER — Ambulatory Visit (INDEPENDENT_AMBULATORY_CARE_PROVIDER_SITE_OTHER): Payer: Medicare HMO | Admitting: Podiatry

## 2023-04-04 ENCOUNTER — Encounter: Payer: Self-pay | Admitting: Podiatry

## 2023-04-04 DIAGNOSIS — B351 Tinea unguium: Secondary | ICD-10-CM

## 2023-04-04 DIAGNOSIS — E119 Type 2 diabetes mellitus without complications: Secondary | ICD-10-CM

## 2023-04-04 DIAGNOSIS — M79675 Pain in left toe(s): Secondary | ICD-10-CM

## 2023-04-04 DIAGNOSIS — M79674 Pain in right toe(s): Secondary | ICD-10-CM

## 2023-04-04 DIAGNOSIS — Q828 Other specified congenital malformations of skin: Secondary | ICD-10-CM | POA: Diagnosis not present

## 2023-04-04 NOTE — Progress Notes (Signed)
This patient returns to my office for at risk foot care.  This patient requires this care by a professional since this patient will be at risk due to having diabetes.    She has pain on the outside border second toenail left.   She has callus under her big toe joint  both feet.  This patient presents for at risk foot care today.  General Appearance  Alert, conversant and in no acute stress.  Vascular  Dorsalis pedis and posterior tibial  pulses are palpable  bilaterally.  Capillary return is within normal limits  bilaterally. Temperature is within normal limits  bilaterally.  Neurologic  Senn-Weinstein monofilament wire test diminished  bilaterally. Muscle power within normal limits bilaterally.  Nails Pincer nail second toe left foot .  Long thick hallux nails.  No infection noted.  Orthopedic  No limitations of motion  feet .  No crepitus or effusions noted.  No bony pathology or digital deformities noted.  HAV  B/L.  Skin  normotropic skin with no porokeratosis noted bilaterally.  No signs of infections or ulcers noted.   Symptomatic callus sub 1  B/L.  Pincer second toenail left foot.  Callus  B/L.  Consent was obtained for treatment procedures.   Debridement of pincer nail and nail spicules with a nail nipper followed by dremel tool usage.     Return office visit      3 months              Told patient to return for periodic foot care and evaluation due to potential at risk complications.     DPM  

## 2023-04-11 DIAGNOSIS — E785 Hyperlipidemia, unspecified: Secondary | ICD-10-CM | POA: Diagnosis not present

## 2023-04-11 DIAGNOSIS — D509 Iron deficiency anemia, unspecified: Secondary | ICD-10-CM | POA: Diagnosis not present

## 2023-04-11 DIAGNOSIS — Z008 Encounter for other general examination: Secondary | ICD-10-CM | POA: Diagnosis not present

## 2023-04-11 DIAGNOSIS — K219 Gastro-esophageal reflux disease without esophagitis: Secondary | ICD-10-CM | POA: Diagnosis not present

## 2023-04-11 DIAGNOSIS — Z8249 Family history of ischemic heart disease and other diseases of the circulatory system: Secondary | ICD-10-CM | POA: Diagnosis not present

## 2023-04-11 DIAGNOSIS — I129 Hypertensive chronic kidney disease with stage 1 through stage 4 chronic kidney disease, or unspecified chronic kidney disease: Secondary | ICD-10-CM | POA: Diagnosis not present

## 2023-04-11 DIAGNOSIS — M810 Age-related osteoporosis without current pathological fracture: Secondary | ICD-10-CM | POA: Diagnosis not present

## 2023-04-11 DIAGNOSIS — Z87891 Personal history of nicotine dependence: Secondary | ICD-10-CM | POA: Diagnosis not present

## 2023-04-11 DIAGNOSIS — N1832 Chronic kidney disease, stage 3b: Secondary | ICD-10-CM | POA: Diagnosis not present

## 2023-04-11 DIAGNOSIS — M1990 Unspecified osteoarthritis, unspecified site: Secondary | ICD-10-CM | POA: Diagnosis not present

## 2023-04-11 DIAGNOSIS — J45909 Unspecified asthma, uncomplicated: Secondary | ICD-10-CM | POA: Diagnosis not present

## 2023-04-11 DIAGNOSIS — F325 Major depressive disorder, single episode, in full remission: Secondary | ICD-10-CM | POA: Diagnosis not present

## 2023-04-11 DIAGNOSIS — E1122 Type 2 diabetes mellitus with diabetic chronic kidney disease: Secondary | ICD-10-CM | POA: Diagnosis not present

## 2023-04-13 ENCOUNTER — Telehealth: Payer: Self-pay

## 2023-04-13 DIAGNOSIS — H539 Unspecified visual disturbance: Secondary | ICD-10-CM

## 2023-04-13 NOTE — Telephone Encounter (Signed)
Copied from CRM [id number]. Topic: Referral - Request for Referral >> Apr 13, 2023  4:11 PM Franchot Heidelberg wrote: Has patient seen PCP for this complaint? Yes. *If NO, is insurance requiring patient see PCP for this issue before PCP can refer them? Referral for which specialty: Ophthalmology/Optometry Preferred provider/office: Highest recommended ("possibly the group on [address] aside from their appts being months out") Reason for referral: Needs an eye exam for eye glasses

## 2023-04-17 NOTE — Addendum Note (Signed)
Addended by: Storm Frisk on: 04/17/2023 07:43 AM   Modules accepted: Orders

## 2023-04-17 NOTE — Telephone Encounter (Signed)
Ref sent

## 2023-04-18 ENCOUNTER — Ambulatory Visit: Payer: Medicare HMO | Admitting: Critical Care Medicine

## 2023-04-19 NOTE — Telephone Encounter (Signed)
Called patient and left voicemail.

## 2023-04-20 ENCOUNTER — Ambulatory Visit: Payer: Medicare HMO | Admitting: Critical Care Medicine

## 2023-05-19 DIAGNOSIS — H5213 Myopia, bilateral: Secondary | ICD-10-CM | POA: Diagnosis not present

## 2023-05-19 DIAGNOSIS — E119 Type 2 diabetes mellitus without complications: Secondary | ICD-10-CM | POA: Diagnosis not present

## 2023-05-19 DIAGNOSIS — H53001 Unspecified amblyopia, right eye: Secondary | ICD-10-CM | POA: Diagnosis not present

## 2023-05-19 DIAGNOSIS — H2513 Age-related nuclear cataract, bilateral: Secondary | ICD-10-CM | POA: Diagnosis not present

## 2023-05-19 LAB — HM DIABETES EYE EXAM

## 2023-05-30 DIAGNOSIS — F209 Schizophrenia, unspecified: Secondary | ICD-10-CM | POA: Diagnosis not present

## 2023-06-15 ENCOUNTER — Telehealth: Payer: Self-pay | Admitting: Critical Care Medicine

## 2023-06-15 NOTE — Telephone Encounter (Signed)
Selena from [facility] called in , says faxed in refill request today, was this received? She says she will fax it in again today

## 2023-06-16 NOTE — Telephone Encounter (Signed)
I have note, I can check when I come back Tuesday

## 2023-06-21 ENCOUNTER — Ambulatory Visit (INDEPENDENT_AMBULATORY_CARE_PROVIDER_SITE_OTHER): Payer: Medicare Other | Admitting: Physician Assistant

## 2023-06-21 ENCOUNTER — Encounter: Payer: Self-pay | Admitting: Physician Assistant

## 2023-06-21 VITALS — BP 118/68 | HR 78 | Resp 18 | Ht 63.0 in | Wt 164.0 lb

## 2023-06-21 DIAGNOSIS — F02818 Dementia in other diseases classified elsewhere, unspecified severity, with other behavioral disturbance: Secondary | ICD-10-CM | POA: Diagnosis not present

## 2023-06-21 NOTE — Progress Notes (Signed)
Assessment/Plan:   Dementia likely due to multiple etiologies  Elizabeth Schwartz is a very pleasant 77 y.o. RH female with a history of hypertension, hyperlipidemia, schizophrenia, DM2, anemia, COPD, depression, arthritis-osteoporosis, asthma, chronic diarrhea, seen today in follow up for memory loss. Patient is currently on memantine 10 mg twice daily and on donepezil 10 mg  by PCP. Because of her chronic diarrhea, discussed holding her donepezil for 1 week and observe any GI improvement. Memory is stable, with MMSE of 28/30 during this visit.     Follow up in 6  months. Continue Memantine 10 mg twice daily. Side effects were discussed  Hod donepezil for 1 week and observe. If diarrhea resolves or improves, then hold it and will entertain rivastigmine in the near future. Recommend good control of her cardiovascular risk factors Continue to control mood as per PCP and BH at [facility] Increase socialization    Subjective:    This patient is here alone.  Previous records as well as any outside records available were reviewed prior to todays visit. Patient was last seen on 12/20/2022.  Last MoCA on September 2023 was 12/30   Any changes in memory since last visit? " About the same".  She is able to remember conversations and people's names.  She writes important items in a calendar, which helps her to stay organized.  LTM is good.likes to do crossword puzzles and  word finding repeats oneself?  Endorsed, but rarely Disoriented when walking into a room?  Patient denies   Leaving objects in unusual places?  May misplace things but not in unusual places  Wandering behavior?  denies   Any personality changes since last visit?  Patient has a history of schizophrenia, seen by [facility], mood is controlled by PCP BH. Any worsening depression?:  Denies.   Hallucinations or paranoia?  Denies.   Seizures? denies    Any sleep changes?  Sleeps well.  Denies vivid dreams, REM behavior or sleepwalking   Sleep  apnea?   Denies.   Any hygiene concerns? Denies.  Independent of bathing and dressing?  Endorsed  Does the patient needs help with medications?  Patient is in charge places meds on tray Who is in charge of the finances?  Patient is in charge    Any changes in appetite?  denies    Patient have trouble swallowing? Denies.   Does the patient cook? Yes, denies any accidents in the kitchen Any headaches?   denies   Chronic back pain ? denies   Ambulates with difficulty?  She has a history of chronic right greater than left leg pain and knee  arthritis, with subsequent limited mobility.  She does not use Rolator often which places her at risk for falls. Recent falls or head injuries? denies     Unilateral weakness, numbness or tingling? denies   Any tremors?  Denies   Any anosmia?  Denies   Any incontinence of urine?  Endorsed, wears diapers Any bowel dysfunction?  Loose stools followed by GI    Patient lives alone in [facility]  Does the patient drive?  Endorsed, denies any issues.   08/25/2022 MRI of the brain personally reviewed is remarkable for moderate chronic small vessel ischemic changes within the cerebral white matter and mild cerebral atrophy    Initial visit 08/03/2022  How long did patient have memory difficulties? " Several months ago" Patient was initially placed on Aricept, and "cannot tell any difference in the memory ".  She has difficulty  organizing her schedule, and forgets appointments, she has decreased concentration. She also has difficulty with TM  Patient lives with:  Patient lives alone in a retirement community, lives alone.  She is divorced, she is estranged from her family, has no children. repeats oneself? Denies  Disoriented when walking into a room?  Patient denies   Leaving objects in unusual places?  Patient denies   Ambulates  with difficulty?   History of chronic right hip and left pain due to arthritis, which limited mobility. Does not use the  walker often although she is at risk for falls   Recent falls?  Patient denies.  Last fall was on December 2022, with neck pain without fractures, no loss of consciousness Any head injuries?  Patient denies   History of seizures?   Patient denies   Wandering behavior?  Patient denies   Patient drives?   Denies any issues driving such as getting lost.   Any mood changes such irritability agitation?  Patient has history of schizophrenia controlled by PCP, has not seen BH in a long time Any history of depression?: Endorsed  Hallucinations?  Patient denies   Paranoia?  Patient denies   Patient reports that sleeps well without vivid dreams, REM behavior or sleepwalking    History of sleep apnea?  Patient denies   Any hygiene concerns?  Patient denies   Independent of bathing and dressing?  Endorsed  Does the patient needs help with medications?  Patient admits to having been placing the pills incorrectly on the pillbox.  Sometimes she gets confused of what medications to take or what they are for. In today's visit she thought that the memory medication was  Ramipril, which it is unclear if this has been discontinued and replaced by other agent.  Who is in charge of the finances?  Patient is in charge, she denies any issues  Any changes in appetite?  Patient denies   Patient have trouble swallowing? Patient denies   Does the patient cook?  Patient denies   Any kitchen accidents such as leaving the stove on? Patient denies   Any headaches?  Patient denies   Double vision? Patient denies   Any focal numbness or tingling?  Patient denies   Chronic back pain Patient denies   Unilateral weakness?  Patient denies   Any tremors?  Patient denies   Any history of anosmia? Denies  Any incontinence of urine? Endorsed, wears Depends Any bowel dysfunction?   History of loose stools, follows with GI History of heavy alcohol intake?  Patient denies   History of heavy tobacco use?  Patient denies   Family  history of dementia?  Patient denies    PREVIOUS MEDICATIONS:   CURRENT MEDICATIONS:  Outpatient Encounter Medications as of 06/21/2023  Medication Sig   acetaminophen (TYLENOL) 500 MG tablet Take 500 mg by mouth 2 (two) times daily.   albuterol (VENTOLIN HFA) 108 (90 Base) MCG/ACT inhaler INHALE TWO PUFFS BY MOUTH EVERY 6 HOURS AS NEEDED FOR SHORTNESS OF BREATH   alendronate (FOSAMAX) 70 MG tablet Take 1 tablet (70 mg total) by mouth once a week.   Blood Glucose Monitoring Suppl (ONETOUCH VERIO) w/Device KIT Use to check blood sugar   calcium carbonate (OS-CAL) 600 MG TABS Take 600 mg by mouth 2 (two) times daily with a meal.   donepezil (ARICEPT) 10 MG tablet Take 1 tablet (10 mg total) by mouth at bedtime.   ezetimibe (ZETIA) 10 MG tablet Take 1 tablet (10 mg  total) by mouth daily.   famotidine (PEPCID) 20 MG tablet Take 1 tablet (20 mg total) by mouth 2 (two) times daily.   fenofibrate 160 MG tablet Take 1 tablet (160 mg total) by mouth daily.   fish oil-omega-3 fatty acids 1000 MG capsule Take 1 g by mouth every morning.   Iron, Ferrous Sulfate, 325 (65 Fe) MG TABS Take 325 mg by mouth daily with breakfast.   metFORMIN (GLUCOPHAGE) 500 MG tablet Take 1 tablet (500 mg total) by mouth 2 (two) times daily with a meal.   Multiple Vitamins-Minerals (CENTRUM SILVER ULTRA WOMENS PO) Take 1 tablet by mouth daily.   OneTouch Delica Lancets 33G MISC Use to check blood sugar daily   ONETOUCH VERIO test strip Use as instructed   SYMBICORT 160-4.5 MCG/ACT inhaler INHALE TWO PUFFS INTO THE LUNGS IN THE MORNING AND AT BEDTIME   vitamin C (ASCORBIC ACID) 500 MG tablet Take 500 mg by mouth every morning.   VITAMIN D, CHOLECALCIFEROL, PO Take 600 Units by mouth daily.   No facility-administered encounter medications on file as of 06/21/2023.        No data to display            08/11/2022   10:00 AM  Montreal Cognitive Assessment   Visuospatial/ Executive (0/5) 1  Naming (0/3) 3  Attention:  Read list of digits (0/2) 1  Attention: Read list of letters (0/1) 1  Attention: Serial 7 subtraction starting at 100 (0/3) 1  Language: Repeat phrase (0/2) 1  Language : Fluency (0/1) 0  Abstraction (0/2) 1  Delayed Recall (0/5) 0  Orientation (0/6) 3  Total 12  Adjusted Score (based on education) 12    Objective:     PHYSICAL EXAMINATION:    VITALS:   Vitals:   06/21/23 1259  BP: 118/68  Pulse: 78  Resp: 18  SpO2: 98%  Weight: 164 lb (74.4 kg)  Height: 5\' 3"  (1.6 m)    GEN:  The patient appears stated age and is in NAD. HEENT:  Normocephalic, atraumatic.   Neurological examination:  General: NAD, well-groomed, appears stated age. Orientation: The patient is alert. Oriented to person, place and date Cranial nerves: There is good facial symmetry.The speech is fluent and clear. No aphasia or dysarthria. Fund of knowledge is appropriate. Recent and remote memory are impaired. Attention and concentration are reduced.  Able to name objects and repeat phrases.  Hearing is intact to conversational tone.   Sensation: Sensation is intact to light touch throughout Motor: Strength is at least antigravity x4. DTR's 2/4 in UE/LE     Movement examination: Tone: There is normal tone in the UE/LE Abnormal movements:  no tremor.  No myoclonus.  No asterixis.   Coordination:  There is no decremation with RAM's. Normal finger to nose  Gait and Station: The patient has no difficulty arising out of a deep-seated chair without the use of the hands. The patient's stride length is good.  Gait is cautious and narrow.    Thank you for allowing [place] the opportunity to participate in the care of this nice patient. Please do not hesitate to contact us for any questions or concerns.   Total time spent on today's visit was 24 minutes dedicated to this patient today, preparing to see patient, examining the patient, ordering tests and/or medications and counseling the patient, documenting clinical  information in the EHR or other health record, independently interpreting results and communicating results to the patient/family, discussing treatment  and goals, answering patient's questions and coordinating care.  Cc:  Storm Frisk, MD  Marlowe Kays 06/21/2023 1:18 PM

## 2023-06-21 NOTE — Patient Instructions (Addendum)
It was a pleasure to see you today at our office.   Recommendations:  Continue memantine  10 mg tablets 1 tablet twice daily.    Consider putting on hold donepezil because of the diarrhea. If the diarrhea continues without the donepezil, them resume. Give it 1 week to see  Follow up in 6 months  Whom to call:  Memory  decline, memory medications: Call our office [phone]   For psychiatric meds, mood meds: Please have your primary care physician  and psychiatry manage these medications.       For assessment of decision of mental capacity and competency:  Call Dr. Erick Blinks, geriatric psychiatrist at [phone]  For guidance in geriatric dementia issues please call [facility] [phone]     If you have any severe symptoms of a stroke, or other severe issues such as confusion,severe chills or fever, etc call 911 or go to the ER as you may need to be evaluated further   Feel free to visit Facebook page " [email or web address]" for tips of how to care for people with memory problems.       RECOMMENDATIONS FOR ALL PATIENTS WITH MEMORY PROBLEMS: 1. Continue to exercise (Recommend 30 minutes of walking everyday, or 3 hours every week) 2. Increase social interactions - continue going to [facility] and enjoy social gatherings with friends and family 3. Eat healthy, avoid fried foods and eat more fruits and vegetables 4. Maintain adequate blood pressure, blood sugar, and blood cholesterol level. Reducing the risk of stroke and cardiovascular disease also helps promoting better memory. 5. Avoid stressful situations. Live a simple life and avoid aggravations. Organize your time and prepare for the next day in anticipation. 6. Sleep well, avoid any interruptions of sleep and avoid any distractions in the bedroom that may interfere with adequate sleep quality 7. Avoid sugar, avoid sweets as there is a strong link between excessive sugar intake, diabetes, and cognitive impairment We  discussed the Mediterranean diet, which has been shown to help patients reduce the risk of progressive memory disorders and reduces cardiovascular risk. This includes eating fish, eat fruits and green leafy vegetables, nuts like almonds and hazelnuts, walnuts, and also use olive oil. Avoid fast foods and fried foods as much as possible. Avoid sweets and sugar as sugar use has been linked to worsening of memory function.  There is always a concern of gradual progression of memory problems. If this is the case, then we may need to adjust level of care according to patient needs. Support, both to the patient and caregiver, should then be put into place.         FALL PRECAUTIONS: Be cautious when walking. Scan the area for obstacles that may increase the risk of trips and falls. When getting up in the mornings, sit up at the edge of the bed for a few minutes before getting out of bed. Consider elevating the bed at the head end to avoid drop of blood pressure when getting up. Walk always in a well-lit room (use night lights in the walls). Avoid area rugs or power cords from appliances in the middle of the walkways. Use a walker or a cane if necessary and consider physical therapy for balance exercise. Get your eyesight checked regularly.  FINANCIAL OVERSIGHT: Supervision, especially oversight when making financial decisions or transactions is also recommended.  HOME SAFETY: Consider the safety of the kitchen when operating appliances like stoves, microwave oven, and blender. Consider having supervision and share cooking responsibilities  until no longer able to participate in those. Accidents with firearms and other hazards in the house should be identified and addressed as well.   ABILITY TO BE LEFT ALONE: If patient is unable to contact 911 operator, consider using LifeLine, or when the need is there, arrange for someone to stay with patients. Smoking is a fire hazard, consider supervision or cessation.  Risk of wandering should be assessed by caregiver and if detected at any point, supervision and safe proof recommendations should be instituted.  MEDICATION SUPERVISION: Inability to self-administer medication needs to be constantly addressed. Implement a mechanism to ensure safe administration of the medications.   DRIVING: Regarding driving, in patients with progressive memory problems, driving will be impaired. We advise to have someone else do the driving if trouble finding directions or if minor accidents are reported. Independent driving assessment is available to determine safety of driving.   If you are interested in the driving assessment, you can contact the following:  [facility] [employer] [facility] in [place] [phone]  [facility] [phone]  [facility] [phone] or [phone]    Mediterranean Diet A Mediterranean diet refers to food and lifestyle choices that are based on the traditions of countries located on the [place]. This way of eating has been shown to help prevent certain conditions and improve outcomes for people who have chronic diseases, like kidney disease and heart disease. What are tips for following this plan? Lifestyle  Cook and eat meals together with your family, when possible. Drink enough fluid to keep your urine clear or pale yellow. Be physically active every day. This includes: Aerobic exercise like running or swimming. Leisure activities like gardening, walking, or housework. Get 7-8 hours of sleep each night. If recommended by your health care provider, drink red wine in moderation. This means 1 glass a day for nonpregnant women and 2 glasses a day for men. A glass of wine equals 5 oz (150 mL). Reading food labels  Check the serving size of packaged foods. For foods such as rice and pasta, the serving size refers to the amount of cooked product, not dry. Check the  total fat in packaged foods. Avoid foods that have saturated fat or trans fats. Check the ingredients list for added sugars, such as corn syrup. Shopping  At the grocery store, buy most of your food from the areas near the walls of the store. This includes: Fresh fruits and vegetables (produce). Grains, beans, nuts, and seeds. Some of these may be available in unpackaged forms or large amounts (in bulk). Fresh seafood. Poultry and eggs. Low-fat dairy products. Buy whole ingredients instead of prepackaged foods. Buy fresh fruits and vegetables in-season from local farmers markets. Buy frozen fruits and vegetables in resealable bags. If you do not have access to quality fresh seafood, buy precooked frozen shrimp or canned fish, such as tuna, salmon, or sardines. Buy small amounts of raw or cooked vegetables, salads, or olives from the deli or salad bar at your store. Stock your pantry so you always have certain foods on hand, such as olive oil, canned tuna, canned tomatoes, rice, pasta, and beans. Cooking  Cook foods with extra-virgin olive oil instead of using butter or other vegetable oils. Have meat as a side dish, and have vegetables or grains as your main dish. This means having meat in small portions or adding small amounts of meat to foods like pasta or stew. Use beans or vegetables instead of meat  in common dishes like chili or lasagna. Experiment with different cooking methods. Try roasting or broiling vegetables instead of steaming or sauteing them. Add frozen vegetables to soups, stews, pasta, or rice. Add nuts or seeds for added healthy fat at each meal. You can add these to yogurt, salads, or vegetable dishes. Marinate fish or vegetables using olive oil, lemon juice, garlic, and fresh herbs. Meal planning  Plan to eat 1 vegetarian meal one day each week. Try to work up to 2 vegetarian meals, if possible. Eat seafood 2 or more times a week. Have healthy snacks readily available,  such as: Vegetable sticks with hummus. Greek yogurt. Fruit and nut trail mix. Eat balanced meals throughout the week. This includes: Fruit: 2-3 servings a day Vegetables: 4-5 servings a day Low-fat dairy: 2 servings a day Fish, poultry, or lean meat: 1 serving a day Beans and legumes: 2 or more servings a week Nuts and seeds: 1-2 servings a day Whole grains: 6-8 servings a day Extra-virgin olive oil: 3-4 servings a day Limit red meat and sweets to only a few servings a month What are my food choices? Mediterranean diet Recommended Grains: Whole-grain pasta. Brown rice. Bulgar wheat. Polenta. Couscous. Whole-wheat bread. Orpah Cobb. Vegetables: Artichokes. Beets. Broccoli. Cabbage. Carrots. Eggplant. Green beans. Chard. Kale. Spinach. Onions. Leeks. Peas. Squash. Tomatoes. Peppers. Radishes. Fruits: Apples. Apricots. Avocado. Berries. Bananas. Cherries. Dates. Figs. Grapes. Lemons. Melon. Oranges. Peaches. Plums. Pomegranate. Meats and other protein foods: Beans. Almonds. Sunflower seeds. Pine nuts. Peanuts. Cod. Salmon. Scallops. Shrimp. Tuna. Tilapia. Clams. Oysters. Eggs. Dairy: Low-fat milk. Cheese. Greek yogurt. Beverages: Water. Red wine. Herbal tea. Fats and oils: Extra virgin olive oil. Avocado oil. Grape seed oil. Sweets and desserts: [place] yogurt with honey. Baked apples. Poached pears. Trail mix. Seasoning and other foods: Basil. Cilantro. Coriander. Cumin. Mint. Parsley. Sage. Rosemary. Tarragon. Garlic. Oregano. Thyme. Pepper. Balsalmic vinegar. Tahini. Hummus. Tomato sauce. Olives. Mushrooms. Limit these Grains: Prepackaged pasta or rice dishes. Prepackaged cereal with added sugar. Vegetables: Deep fried potatoes (french fries). Fruits: Fruit canned in syrup. Meats and other protein foods: Beef. Pork. Lamb. Poultry with skin. Hot dogs. Tomasa Blase. Dairy: Ice cream. Sour cream. Whole milk. Beverages: Juice. Sugar-sweetened soft drinks. Beer. Liquor and spirits. Fats and  oils: Butter. Canola oil. Vegetable oil. Beef fat (tallow). Lard. Sweets and desserts: Cookies. Cakes. Pies. Candy. Seasoning and other foods: Mayonnaise. Premade sauces and marinades. The items listed may not be a complete list. Talk with your dietitian about what dietary choices are right for you. Summary The Mediterranean diet includes both food and lifestyle choices. Eat a variety of fresh fruits and vegetables, beans, nuts, seeds, and whole grains. Limit the amount of red meat and sweets that you eat. Talk with your health care provider about whether it is safe for you to drink red wine in moderation. This means 1 glass a day for nonpregnant women and 2 glasses a day for men. A glass of wine equals 5 oz (150 mL). This information is not intended to replace advice given to you by your health care provider. Make sure you discuss any questions you have with your health care provider. Document Released: 06/23/2016 Document Revised: 07/26/2016 Document Reviewed: 06/23/2016 Elsevier Interactive Patient Education  2017 [employer].   We have sent a referral to [facility] for your MRI and they will call you directly to schedule your appointment. They are located at [address] [facility]. If you need to contact them directly please call [phone].  Your provider has requested that you have labwork completed today. Please go to [facility] ([id number]) on the second floor of this building before leaving the office today. You do not need to check in. If you are not called within 15 minutes please check with the front desk.

## 2023-06-28 ENCOUNTER — Telehealth: Payer: Self-pay | Admitting: Physician Assistant

## 2023-06-28 NOTE — Telephone Encounter (Signed)
Pt is calling in stating that she rec'd pw from the provider at her last visit to have labs done and she did not have it.  I am not seeing any lab orders in the system but pt would like to come in today to have those labs done.  Pt would like to have a call back.

## 2023-06-28 NOTE — Telephone Encounter (Signed)
No labs were needed, thanked me for calling.

## 2023-07-05 ENCOUNTER — Ambulatory Visit: Payer: Medicare Other | Admitting: Podiatry

## 2023-07-05 ENCOUNTER — Encounter: Payer: Self-pay | Admitting: Podiatry

## 2023-07-05 DIAGNOSIS — M2010 Hallux valgus (acquired), unspecified foot: Secondary | ICD-10-CM

## 2023-07-05 DIAGNOSIS — M79675 Pain in left toe(s): Secondary | ICD-10-CM | POA: Diagnosis not present

## 2023-07-05 DIAGNOSIS — Q828 Other specified congenital malformations of skin: Secondary | ICD-10-CM | POA: Diagnosis not present

## 2023-07-05 DIAGNOSIS — M79674 Pain in right toe(s): Secondary | ICD-10-CM

## 2023-07-05 DIAGNOSIS — B351 Tinea unguium: Secondary | ICD-10-CM | POA: Diagnosis not present

## 2023-07-05 DIAGNOSIS — E119 Type 2 diabetes mellitus without complications: Secondary | ICD-10-CM | POA: Diagnosis not present

## 2023-07-05 NOTE — Progress Notes (Signed)
This patient returns to my office for at risk foot care.  This patient requires this care by a professional since this patient will be at risk due to having diabetes.    She has pain on the outside border second toenail left.   She has callus under her big toe joint  both feet.  This patient presents for at risk foot care today.  General Appearance  Alert, conversant and in no acute stress.  Vascular  Dorsalis pedis and posterior tibial  pulses are palpable  bilaterally.  Capillary return is within normal limits  bilaterally. Temperature is within normal limits  bilaterally.  Neurologic  Senn-Weinstein monofilament wire test diminished  bilaterally. Muscle power within normal limits bilaterally.  Nails Pincer nail second toe left foot .  Long thick hallux nails.  No infection noted.  Orthopedic  No limitations of motion  feet .  No crepitus or effusions noted.  No bony pathology or digital deformities noted.  HAV  B/L.  Skin  normotropic skin with no porokeratosis noted bilaterally.  No signs of infections or ulcers noted.   Symptomatic callus sub 1  B/L.  Pincer second toenail left foot.  Callus  B/L.  Consent was obtained for treatment procedures.   Debridement of pincer nail and nail spicules with a nail nipper followed by dremel tool usage.  Debridement  of callus with # 15 blade.   Return office visit      3 months              Told patient to return for periodic foot care and evaluation due to potential at risk complications.   Helane Gunther DPM

## 2023-07-12 ENCOUNTER — Ambulatory Visit: Payer: Self-pay | Admitting: *Deleted

## 2023-07-12 NOTE — Telephone Encounter (Signed)
Patient verfied name and DOB.  States she has ongoing memory issues for sometime. Denies recent falls. States she lives alone.  Went over medication changes from Neurology appt. On 06/21/2023:  Follow up in 6  months. Continue Memantine 10 mg twice daily. Side effects were discussed  Hod donepezil for 1 week and observe. If diarrhea resolves or improves, then hold it and will entertain rivastigmine in the near future. Recommend good control of her cardiovascular risk factors Continue to control mood as per PCP and [facility] at [facility] Increase socialization    She stastes she has on hand memantine 5 mg.  Did not see new dose on medication list but advised patient of changes. She states she no longer has diarrhea as of today. She states the constipation and diarrhea is ongoing.   Dr. Alvis Lemmings is not in the office tomorrow. Advised patient to keep apt tomorrow at 0930 to see Dr. Delford Field. Patient verbalized understanding.

## 2023-07-12 NOTE — Telephone Encounter (Signed)
  Chief Complaint: worsening speech issues, chronic confusion, weakness no energy. Symptoms: see above , patient lives alone in apartment and has no family for assistance , word finding issues.  Frequency: 7-8 days  Pertinent Negatives: Patient denies chest pain no difficulty breathing no N/T no weakness on either side of body . No slurred speech. No headache no blurred vision Disposition: [] ED /[] Urgent Care (no appt availability in office) / [x] Appointment(In office/virtual)/ []  [facility]/ [] Home Care/ [] Refused Recommended Disposition /[] [facility]/ []  Follow-up with PCP Additional Notes:   Please advise. Recommended evaluation in ED if sx worsen. Patient unsure of going to ED bc she will wait for hours and be sent home . No family to assist her. French Ana from her apartment complex attempting to assist her today due to patient losing phone. Now has another phone to use. Please advise if patient needs to go to ED.  Please advise if patient safe to live alone.     Reason for Disposition  [1] Loss of speech or garbled speech AND [2] is a chronic symptom (recurrent or ongoing AND present > 4 weeks)  Answer Assessment - Initial Assessment Questions 1. SYMPTOM: "What is the main symptom you are concerned about?" (e.g., weakness, numbness)     Weakness ,tired out of energy , speech issues  2. ONSET: "When did this start?" (minutes, hours, days; while sleeping)     7-8 days ago  3. LAST NORMAL: "When was the last time you (the patient) were normal (no symptoms)?"     Hx confusion  4. PATTERN "Does this come and go, or has it been constant since it started?"  "Is it present now?"     Na  5. CARDIAC SYMPTOMS: "Have you had any of the following symptoms: chest pain, difficulty breathing, palpitations?"     Denies 6. NEUROLOGIC SYMPTOMS: "Have you had any of the following symptoms: headache, dizziness, vision loss, double vision, changes in speech, unsteady on your feet?"      Difficulty finding words, confusion, weakness 7. OTHER SYMPTOMS: "Do you have any other symptoms?"     na 8. PREGNANCY: "Is there any chance you are pregnant?" "When was your last menstrual period?"     na  Protocols used: Neurologic Deficit-A-AH

## 2023-07-13 ENCOUNTER — Encounter: Payer: Self-pay | Admitting: Critical Care Medicine

## 2023-07-13 ENCOUNTER — Telehealth: Payer: Medicare Other | Admitting: Family Medicine

## 2023-07-13 ENCOUNTER — Telehealth: Payer: Self-pay

## 2023-07-13 ENCOUNTER — Ambulatory Visit: Payer: Medicare Other | Admitting: Critical Care Medicine

## 2023-07-13 VITALS — BP 100/65 | HR 76 | Ht 63.0 in | Wt 165.2 lb

## 2023-07-13 DIAGNOSIS — J4540 Moderate persistent asthma, uncomplicated: Secondary | ICD-10-CM | POA: Diagnosis not present

## 2023-07-13 DIAGNOSIS — F02818 Dementia in other diseases classified elsewhere, unspecified severity, with other behavioral disturbance: Secondary | ICD-10-CM

## 2023-07-13 DIAGNOSIS — E119 Type 2 diabetes mellitus without complications: Secondary | ICD-10-CM

## 2023-07-13 DIAGNOSIS — I10 Essential (primary) hypertension: Secondary | ICD-10-CM

## 2023-07-13 DIAGNOSIS — E782 Mixed hyperlipidemia: Secondary | ICD-10-CM

## 2023-07-13 DIAGNOSIS — Z7984 Long term (current) use of oral hypoglycemic drugs: Secondary | ICD-10-CM

## 2023-07-13 MED ORDER — FAMOTIDINE 20 MG PO TABS: 20.0000 mg | ORAL_TABLET | Freq: Two times a day (BID) | ORAL | 0 refills | Status: AC

## 2023-07-13 MED ORDER — METFORMIN HCL 500 MG PO TABS: 500.0000 mg | ORAL_TABLET | Freq: Two times a day (BID) | ORAL | 2 refills | Status: DC

## 2023-07-13 MED ORDER — SYMBICORT 160-4.5 MCG/ACT IN AERO: INHALATION_SPRAY | RESPIRATORY_TRACT | 2 refills | Status: DC

## 2023-07-13 MED ORDER — ONETOUCH DELICA LANCETS 33G MISC: 1 refills | Status: AC

## 2023-07-13 MED ORDER — FENOFIBRATE 160 MG PO TABS: 160.0000 mg | ORAL_TABLET | Freq: Every day | ORAL | 6 refills | Status: AC

## 2023-07-13 MED ORDER — DONEPEZIL HCL 10 MG PO TABS: 10.0000 mg | ORAL_TABLET | Freq: Every day | ORAL | 3 refills | Status: DC

## 2023-07-13 MED ORDER — ONETOUCH VERIO VI STRP: ORAL_STRIP | 2 refills | Status: AC

## 2023-07-13 MED ORDER — EZETIMIBE 10 MG PO TABS: 10.0000 mg | ORAL_TABLET | Freq: Every day | ORAL | 3 refills | Status: AC

## 2023-07-13 MED ORDER — ALBUTEROL SULFATE HFA 108 (90 BASE) MCG/ACT IN AERS: INHALATION_SPRAY | RESPIRATORY_TRACT | 3 refills | Status: DC

## 2023-07-13 MED ORDER — ALENDRONATE SODIUM 70 MG PO TABS: 70.0000 mg | ORAL_TABLET | ORAL | 3 refills | Status: DC

## 2023-07-13 MED ORDER — ONETOUCH VERIO W/DEVICE KIT: PACK | 0 refills | Status: AC

## 2023-07-13 NOTE — Assessment & Plan Note (Signed)
Controlled off medication

## 2023-07-13 NOTE — Telephone Encounter (Signed)
I met with the patient when she was in the clinic today to discuss options for memory care facility She said she was in agreement but because she does not qualify for Medicaid at this point, the care would be private pay.  She said she remembers talking to someone about a payment plan but she could not remember who she spoke to. She said she has no family or friends to assist her with managing her finances or finding a facility. The only person she could this of is French Ana, Comptroller at her apartment complex.  She was able to provide me with Tracy's # [phone] and was agreeable to having me contact French Ana to see where they are in the process of identifying a facility for the patient  I called French Ana and left a message for her requesting a call back.  While I was with the patient, we called [name], Admissions- [employer]. She said that they do not have a payment plan.  The private pay cost for memory care is $ 5200/month and for ALF is $4850/month.  The patient said she cannot afford that yet she is not sure of her monthly income or assets.  [name] said that if her income is > $1700/month she will not qualify for Medicaid. The patient said her income is > $1700/month   I am also referring her to [facility] care management for assistance with placement

## 2023-07-13 NOTE — Telephone Encounter (Signed)
Call received from [facility] Joslin/ Resident Service Coordinator at patient's apartment complex. French Ana explained that she has tried to hep the patient with supportive housing in the past.  They were focused on ALF and she worked with Franki Monte- [facility] Long Term Placement: [phone].  She said that she and the patient met with Chastiny a couple of times but the patient decided she did not want to go to another apartment/facility.  She is currently in [address] housing.  French Ana is not fully aware of the patient's financial situation and said they tired to contact the patient's niece for assistance but the niece did not seem interested in helping and did not return Tracy/ Mayanna's call.  French Ana expressed concerns that the patient's memory/cognition have worsened.  I told her that I will call Chastiny to see if she is able to offer any assistance.  I then left a message for [employer] [place]/ DSS

## 2023-07-13 NOTE — Assessment & Plan Note (Addendum)
Controlled on metformin check A1c check labs Continue with blood sugar assessment with glucometer daily She is not on any insulin she is only on metformin

## 2023-07-13 NOTE — Progress Notes (Addendum)
Notes  Established Patient Office Visit  Subjective:  Patient ID: Elizabeth Schwartz, female    DOB: [birth year]  Age: 77 y.o. MRN: [id number]  Chief Complaint  Patient presents with   Fatigue    Along with weakness , patient is experiencing speech issues and confusion.      HPI 09/15/21 [name] presents for primary care follow-up.  She has an issue with loose stools and bowel incontinence.  She is having to wear depends now.  She states there is mucus in the diarrhea.  Note we have worked this up in the past with stools for pathogens and this was negative.  Patient denies any nausea or vomiting or other GI complaints.  Patient also complains of chronic right hip and left knee pain.  She has not had any falls.  Patient also continues with her mental health medications.  She has history of diagnosed schizophrenia and neurocognitive disorder due to multiple etiologies.  Her diabetes is well controlled and on arrival hemoglobin A1c was 6.0.  Blood pressure is good at 117/67.  Patient's not had any flareups of her asthma as well.  Patient did have a Cologuard test and we did not receive the result the patient was told it was negative.  01/25/22 Patient returns in follow-up and has slight increase in memory difficulty otherwise doing reasonably well however on arrival blood pressure initially was elevated but on recheck was 124/84.  She did have a fall in November but has had no falls since that time it was because of a low blood pressure.  Her ramipril was discontinued she is off all blood pressure medicines now.  Blood sugar on arrival was 98.  She brings all her medications with her and we did a complete medication reconciliation at this visit.  The patient needs a Prevnar 20 vaccine and she will receive it.  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  She is taking in place of statins fish oil, Zetia, fenofibrate.  She is also trying to follow a healthier diet.  Patient is seen orthopedics recently had injection of the left shoulder with steroids this is improved her left shoulder pain.  She still has pain at the base of the neck.  On arrival blood pressure 124/76.  Her blood sugars at home have ranged 131 170. A1c is good at 5.9.  Patient does complain of some increased blurred vision.  She does have an eye doctor and she will need to see sooner.  Also history of iron deficiency and will need to have her renal function blood levels checked at this visit.  There are no other complaints.  She needs multiple medications refilled.  9/20  Patient seen in return follow-up from July visit on a short-term basis because of complaints of progression in memory loss.  Appears to be worse over the past several months.  She has been on Aricept she cannot tell this made any difference.  She has difficulty organizing her schedule and forgetting appointments.  She is doing her own pill organizer she claims she is putting the pills incorrectly out of the bottles.  She was however when we went over her medications somewhat confused as  to what medications were for which purpose.  Note she is divorced from her husband she has no children her siblings are all deceased she has 2 nieces living in [place] but they did not associate with her.  She does live in a retirement apartment [employer].  There is an Health and safety inspector that organizes events in the community center for the residents.  The apartment manager was concerned about her mental status and did notify us of need for this visit.  Patient did agree to and received her flu vaccine at this visit.  There are no other complaints.  A1c at the last visit was good at  5.9.  Patient was in the ER in December for a fall but hadHas not her fall since that time.  11/17/22 Patient seen in return follow-up she still in an independent living apartment.  Her mental status has remained about the same however her apartment manager feels she would benefit from assisted living the patient agrees.  She likely needs memory care.  A1c on arrival 6.2 blood pressure is 130/80.  Patient needs a handicap plate renewed.  She needs refills on multiple medications.  I did complete total medication reconciliation and all medications the medication list are accurate.  She does use a pill organizer.  There are no other complaints.  She has had no falls since last visit.  01/19/23 Patient seen in return follow-up she fell next to the [facility] she bumped into another person on the sidewalk.  She injured her right toe.  No other injuries.  She denies other falls.  Today on arrival blood pressure soft 86/51.  She denies symptoms from this.  Recently saw neurology as documented below and they changed her to Namenda plus Aricept.  Patient is interested in assisted living and is going to tour one short-term.  03/29/23 Patient seen in return follow-up.  She was sent a request for her to get genetic labs but this appears to be a scam and I did not sign the order.  She is out of several medications needs refills.  Her memory continues to be a challenge and she now agrees to go to memory care.  We tried to refer her to [facility] house but there may be a barrier due to cost and lack of beds.  Patient brought a urine sample she had placed in expired medication bottle we could not except this and there is no indication for this.  Patient clearly has continued memory issues and cannot keep up with her matters as she is writing notes all over different pages that are ill legible.  8/29 Patient is seen in return follow-up her neurologic status has progressed.  She is having difficulty making and  organizing her thoughts.  She got lost coming to the clinic today when she drove here.  She is in an apartment complex and has some support but not significant.  She is subject to scam individuals trying to make money off her health.  She is not organized.  Just saw neurology 1 August as documented below. Currently her diabetes is well-controlled and she is on metformin alone and only needs blood sugar testing daily Neuro oV 06/21/23 oyce Hanan is a very pleasant 77 y.o. RH female with a history of hypertension, hyperlipidemia, schizophrenia, DM2, anemia, COPD, depression, arthritis-osteoporosis, asthma, chronic diarrhea, seen today in follow up for memory loss. Patient is currently on memantine 10 mg twice daily and on donepezil 10 mg  by PCP.  Because of her chronic diarrhea, discussed holding her donepezil for 1 week and observe any GI improvement. Memory is stable, with MMSE of 28/30 during this visit.      Follow up in 6  months. Continue Memantine 10 mg twice daily. Side effects were discussed  Hod donepezil for 1 week and observe. If diarrhea resolves or improves, then hold it and will entertain rivastigmine in the near future. Recommend good control of her cardiovascular risk factors Continue to control mood as per PCP and [facility] at [facility] Increase socialization  The patient also has type 2 diabetes and has no insulin use she has metformin alone.  She needs a glucose testing device to check her blood sugar at least 1 time a day  Past Medical History:  Diagnosis Date   Anemia    COPD (chronic obstructive pulmonary disease) (HCC)    Depression    Diabetes mellitus    GERD (gastroesophageal reflux disease)    Hyperlipidemia    Hypertension    Osteoporosis    Schizophrenia, schizo-affective (HCC)    SOB (shortness of breath) on exertion    Syncope 06/11/2019    Past Surgical History:  Procedure Laterality Date   BREAST BIOPSY Right 2017   benign   LAPAROSCOPY  1975   ABD pain    VAGINAL HYSTERECTOMY  1975    Family History  Problem Relation Age of Onset   Breast cancer Neg Hx     Social History   Socioeconomic History   Marital status: Single    Spouse name: Not on file   Number of children: 0   Years of education: 12   Highest education level: Not on file  Occupational History   Not on file  Tobacco Use   Smoking status: Former    Current packs/day: 0.00    Types: Cigarettes    Start date: 02/27/1997    Quit date: 02/28/2012    Years since quitting: 11.4   Smokeless tobacco: Former    Types: Associate Professor status: Never Used  Substance and Sexual Activity   Alcohol use: Not Currently    Comment: "socially"   Drug use: No   Sexual activity: Not Currently  Other Topics Concern   Not on file  Social History Narrative   Right handed   Drinks caffeine   Retired   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)   Social Determinants of Health   Financial Resource Strain: Low Risk  (03/22/2023)   Overall Financial Resource Strain (CARDIA)    Difficulty of Paying Living Expenses: Not hard at all  Food Insecurity: No Food Insecurity (03/22/2023)   Hunger Vital Sign    Worried About Running Out of Food in the Last Year: Never true    Ran Out of Food in the Last Year: Never true  Transportation Needs: No Transportation Needs (03/22/2023)   PRAPARE - Administrator, Civil Service (Medical): No    Lack of Transportation (Non-Medical): No  Physical Activity: Insufficiently Active (03/22/2023)   Exercise Vital Sign    Days of Exercise per Week: 3 days    Minutes of Exercise per Session: 30 min  Stress: No Stress Concern Present (03/22/2023)   [employer] of Occupational Health - Occupational Stress Questionnaire    Feeling of Stress : Not at all  Social Connections: Socially Isolated (03/22/2023)   Social Connection and Isolation Panel [NHANES]    Frequency of Communication with Friends and  Family: Three  times a week    Frequency of Social Gatherings with Friends and Family: Never    Attends Religious Services: Never    Database administrator or Organizations: No    Attends Banker Meetings: Never    Marital Status: Never married  Intimate Partner Violence: Not At Risk (03/22/2023)   Humiliation, Afraid, Rape, and Kick questionnaire    Fear of Current or Ex-Partner: No    Emotionally Abused: No    Physically Abused: No    Sexually Abused: No    Outpatient Medications Prior to Visit  Medication Sig Dispense Refill   acetaminophen (TYLENOL) 500 MG tablet Take 500 mg by mouth 2 (two) times daily.     calcium carbonate (OS-CAL) 600 MG TABS Take 600 mg by mouth 2 (two) times daily with a meal.     fish oil-omega-3 fatty acids 1000 MG capsule Take 1 g by mouth every morning.     Iron, Ferrous Sulfate, 325 (65 Fe) MG TABS Take 325 mg by mouth daily with breakfast. 60 tablet 4   Multiple Vitamins-Minerals (CENTRUM SILVER ULTRA WOMENS PO) Take 1 tablet by mouth daily.     vitamin C (ASCORBIC ACID) 500 MG tablet Take 500 mg by mouth every morning.     VITAMIN D, CHOLECALCIFEROL, PO Take 600 Units by mouth daily.     albuterol (VENTOLIN HFA) 108 (90 Base) MCG/ACT inhaler INHALE TWO PUFFS BY MOUTH EVERY 6 HOURS AS NEEDED FOR SHORTNESS OF BREATH 18 g 3   alendronate (FOSAMAX) 70 MG tablet Take 1 tablet (70 mg total) by mouth once a week. 12 tablet 3   Blood Glucose Monitoring Suppl (ONETOUCH VERIO) w/Device KIT Use to check blood sugar 1 kit 0   donepezil (ARICEPT) 10 MG tablet Take 1 tablet (10 mg total) by mouth at bedtime. 90 tablet 3   ezetimibe (ZETIA) 10 MG tablet Take 1 tablet (10 mg total) by mouth daily. 90 tablet 3   famotidine (PEPCID) 20 MG tablet Take 1 tablet (20 mg total) by mouth 2 (two) times daily. 180 tablet 0   fenofibrate 160 MG tablet Take 1 tablet (160 mg total) by mouth daily. 60 tablet 6   metFORMIN (GLUCOPHAGE) 500 MG tablet Take 1 tablet (500 mg total) by mouth  2 (two) times daily with a meal. 180 tablet 2   OneTouch Delica Lancets 33G MISC Use to check blood sugar daily 100 each 1   ONETOUCH VERIO test strip Use as instructed 100 each 2   SYMBICORT 160-4.5 MCG/ACT inhaler INHALE TWO PUFFS INTO THE LUNGS IN THE MORNING AND AT BEDTIME 10.2 g 2   No facility-administered medications prior to visit.    Allergies  Allergen Reactions   Crestor [Rosuvastatin]     Muscle aches.     ROS Review of Systems  Constitutional: Negative.   HENT: Negative.  Negative for ear pain, postnasal drip, rhinorrhea, sinus pressure, sore throat, trouble swallowing and voice change.   Eyes:  Positive for visual disturbance.  Respiratory: Negative.  Negative for apnea, cough, choking, chest tightness, shortness of breath, wheezing and stridor.   Cardiovascular: Negative.  Negative for chest pain, palpitations and leg swelling.  Gastrointestinal:  Negative for abdominal distention, abdominal pain, anal bleeding, blood in stool, constipation, diarrhea, nausea, rectal pain and vomiting.  Genitourinary: Negative.   Musculoskeletal:  Positive for arthralgias. Negative for myalgias.       Multiple joint pains  Skin: Negative.  Negative for rash.  Allergic/Immunologic: Negative.  Negative for environmental allergies and food allergies.  Neurological:  Positive for speech difficulty and weakness. Negative for dizziness, tremors, seizures, syncope, facial asymmetry, light-headedness, numbness and headaches.       Poor short-term memory  Hematological: Negative.  Negative for adenopathy. Does not bruise/bleed easily.  Psychiatric/Behavioral:  Positive for confusion and decreased concentration. Negative for agitation, dysphoric mood, hallucinations, self-injury, sleep disturbance and suicidal ideas. The patient is not nervous/anxious and is not hyperactive.       Objective:    Physical Exam Vitals reviewed.  Constitutional:      Appearance: Normal appearance. She is  well-developed. She is not diaphoretic.  HENT:     Head: Normocephalic and atraumatic.     Nose: Nose normal. No nasal deformity, septal deviation, mucosal edema or rhinorrhea.     Right Sinus: No maxillary sinus tenderness or frontal sinus tenderness.     Left Sinus: No maxillary sinus tenderness or frontal sinus tenderness.     Mouth/Throat:     Mouth: Mucous membranes are moist.     Pharynx: Oropharynx is clear. No oropharyngeal exudate.  Eyes:     General: No scleral icterus.       Right eye: No discharge.        Left eye: No discharge.     Extraocular Movements: Extraocular movements intact.     Conjunctiva/sclera: Conjunctivae normal.     Pupils: Pupils are equal, round, and reactive to light.     Comments: No visual field cuts  Neck:     Thyroid: No thyromegaly.     Vascular: No carotid bruit or JVD.     Trachea: Trachea normal. No tracheal tenderness or tracheal deviation.  Cardiovascular:     Rate and Rhythm: Normal rate and regular rhythm.     Chest Wall: PMI is not displaced.     Pulses: Normal pulses. No decreased pulses.     Heart sounds: Normal heart sounds, S1 normal and S2 normal. Heart sounds not distant. No murmur heard.    No systolic murmur is present.     No diastolic murmur is present.     No friction rub. No gallop. No S3 or S4 sounds.  Pulmonary:     Effort: No tachypnea, accessory muscle usage or respiratory distress.     Breath sounds: No stridor. No decreased breath sounds, wheezing, rhonchi or rales.  Chest:     Chest wall: No tenderness.  Abdominal:     General: Bowel sounds are normal. There is no distension.     Palpations: Abdomen is soft. Abdomen is not rigid.     Tenderness: There is no abdominal tenderness. There is no guarding or rebound.  Musculoskeletal:        General: Normal range of motion.     Cervical back: Normal range of motion and neck supple. No edema, erythema or rigidity. No muscular tenderness. Normal range of motion.      Comments: No bruising or injury to the right great toe  Lymphadenopathy:     Head:     Right side of head: No submental or submandibular adenopathy.     Left side of head: No submental or submandibular adenopathy.     Cervical: No cervical adenopathy.  Skin:    General: Skin is warm and dry.     Coloration: Skin is not pale.     Findings: No rash.     Nails: There is no clubbing.  Neurological:  General: No focal deficit present.     Mental Status: She is alert and oriented to person, place, and time.     Cranial Nerves: Cranial nerves 2-12 are intact. No cranial nerve deficit.     Sensory: Sensation is intact. No sensory deficit.     Motor: Motor function is intact. No weakness.     Coordination: Coordination is intact. Coordination normal.     Gait: Gait is intact. Gait normal.     Deep Tendon Reflexes: Reflexes normal.  Psychiatric:        Attention and Perception: Attention and perception normal.        Mood and Affect: Mood and affect normal.        Speech: Speech normal.        Behavior: Behavior normal.        Thought Content: Thought content normal.        Cognition and Memory: Cognition is impaired. Memory is impaired. She exhibits impaired recent memory and impaired remote memory.        Judgment: Judgment normal.   Get up and go test was normal  BP 100/65 (BP Location: Left Arm, Patient Position: Sitting, Cuff Size: Normal)   Pulse 76   Ht 5\' 3"  (1.6 m)   Wt 165 lb 3.2 oz (74.9 kg)   SpO2 98%   BMI 29.26 kg/m  Wt Readings from Last 3 Encounters:  07/13/23 165 lb 3.2 oz (74.9 kg)  06/21/23 164 lb (74.4 kg)  03/29/23 166 lb (75.3 kg)     Health Maintenance Due  Topic Date Due   INFLUENZA VACCINE  06/15/2023   COVID-19 Vaccine (6 - 2023-24 season) 07/16/2023      There are no preventive care reminders to display for this patient.  Lab Results  Component Value Date   TSH 1.90 08/11/2022   Lab Results  Component Value Date   WBC 5.9 07/13/2023    HGB 11.0 (L) 07/13/2023   HCT 35.4 07/13/2023   MCV 95 07/13/2023   PLT 261 07/13/2023   Lab Results  Component Value Date   NA 145 (H) 07/13/2023   K 4.3 07/13/2023   CO2 22 07/13/2023   GLUCOSE 136 (H) 07/13/2023   BUN 34 (H) 07/13/2023   CREATININE 1.56 (H) 07/13/2023   BILITOT 0.2 07/13/2023   ALKPHOS 36 (L) 07/13/2023   AST 21 07/13/2023   ALT 25 07/13/2023   PROT 6.8 07/13/2023   ALBUMIN 4.7 07/13/2023   CALCIUM 9.1 07/13/2023   ANIONGAP 15 03/25/2019   EGFR 34 (L) 07/13/2023   Lab Results  Component Value Date   CHOL 211 (H) 07/13/2023   Lab Results  Component Value Date   HDL 54 07/13/2023   Lab Results  Component Value Date   LDLCALC 112 (H) 07/13/2023   Lab Results  Component Value Date   TRIG 260 (H) 07/13/2023   Lab Results  Component Value Date   CHOLHDL 3.9 07/13/2023   Lab Results  Component Value Date   HGBA1C 6.1 (H) 07/13/2023      Assessment & Plan:   Problem List Items Addressed This Visit       Cardiovascular and Mediastinum   Hypertension - Primary    Controlled off medication      Relevant Medications   ezetimibe (ZETIA) 10 MG tablet   fenofibrate 160 MG tablet   Other Relevant Orders   Comprehensive metabolic panel (Completed)   CBC with Differential/Platelet (Completed)     Respiratory  Asthma, moderate persistent    Stable during time      Relevant Medications   albuterol (VENTOLIN HFA) 108 (90 Base) MCG/ACT inhaler   SYMBICORT 160-4.5 MCG/ACT inhaler     Endocrine   Diabetes type 2, controlled (HCC)    Controlled on metformin check A1c check labs Continue with blood sugar assessment with glucometer daily She is not on any insulin she is only on metformin      Relevant Medications   metFORMIN (GLUCOPHAGE) 500 MG tablet   Other Relevant Orders   Comprehensive metabolic panel (Completed)   Hemoglobin A1c (Completed)     Nervous and Auditory   Major neurocognitive disorder due to multiple etiologies with  behavioral disturbance (HCC)    Neurologic status progressing we will look into assisted living      Relevant Medications   donepezil (ARICEPT) 10 MG tablet     Other   Mixed hyperlipidemia   Relevant Medications   ezetimibe (ZETIA) 10 MG tablet   fenofibrate 160 MG tablet   Other Relevant Orders   Lipid panel (Completed)   Other Visit Diagnoses     Diabetes mellitus treated with oral medication (HCC)       Relevant Medications   metFORMIN (GLUCOPHAGE) 500 MG tablet       Meds ordered this encounter  Medications   albuterol (VENTOLIN HFA) 108 (90 Base) MCG/ACT inhaler    Sig: INHALE TWO PUFFS BY MOUTH EVERY 6 HOURS AS NEEDED FOR SHORTNESS OF BREATH    Dispense:  18 g    Refill:  3   SYMBICORT 160-4.5 MCG/ACT inhaler    Sig: INHALE TWO PUFFS INTO THE LUNGS IN THE MORNING AND AT BEDTIME    Dispense:  10.2 g    Refill:  2   donepezil (ARICEPT) 10 MG tablet    Sig: Take 1 tablet (10 mg total) by mouth at bedtime.    Dispense:  90 tablet    Refill:  3   ezetimibe (ZETIA) 10 MG tablet    Sig: Take 1 tablet (10 mg total) by mouth daily.    Dispense:  90 tablet    Refill:  3   fenofibrate 160 MG tablet    Sig: Take 1 tablet (160 mg total) by mouth daily.    Dispense:  60 tablet    Refill:  6   famotidine (PEPCID) 20 MG tablet    Sig: Take 1 tablet (20 mg total) by mouth 2 (two) times daily.    Dispense:  180 tablet    Refill:  0   metFORMIN (GLUCOPHAGE) 500 MG tablet    Sig: Take 1 tablet (500 mg total) by mouth 2 (two) times daily with a meal.    Dispense:  180 tablet    Refill:  2   alendronate (FOSAMAX) 70 MG tablet    Sig: Take 1 tablet (70 mg total) by mouth once a week.    Dispense:  12 tablet    Refill:  3   ONETOUCH VERIO test strip    Sig: Use as instructed    Dispense:  100 each    Refill:  2   Blood Glucose Monitoring Suppl (ONETOUCH VERIO) w/Device KIT    Sig: Use to check blood sugar    Dispense:  1 kit    Refill:  0   OneTouch Delica Lancets 33G  MISC    Sig: Use to check blood sugar daily    Dispense:  100 each  Refill:  1   Follow-up: Return in about 4 months (around 11/12/2023) for diabetes, htn, primary care follow up.    Shan Levans, MD

## 2023-07-13 NOTE — Assessment & Plan Note (Signed)
Neurologic status progressing we will look into assisted living

## 2023-07-13 NOTE — Telephone Encounter (Signed)
I received a cal back from [place] / DSS and I explained our concerns about the patient's memory and need for memory care placement. I also explained that we are unsure of her financial situation but I am aware that she does not have Medicaid at this time.  Chastiny recalls meeting with the patient and [place] Ana and she said she will reach out to the patient again and see how she can assist with the placement process. I encouraged her to also reach out to [facility] if she is not able to reach the patient. She said she would update me when she has information to share.

## 2023-07-13 NOTE — Patient Instructions (Signed)
Elizabeth Schwartz met with you about assisted living  Labs today  Medications refilled   Return primary care 4 months

## 2023-07-13 NOTE — Assessment & Plan Note (Signed)
Stable during time

## 2023-07-13 NOTE — Progress Notes (Signed)
Possible flu

## 2023-07-14 ENCOUNTER — Telehealth: Payer: Self-pay

## 2023-07-14 ENCOUNTER — Telehealth: Payer: Self-pay | Admitting: *Deleted

## 2023-07-14 LAB — COMPREHENSIVE METABOLIC PANEL
ALT: 25 IU/L (ref 0–32)
AST: 21 IU/L (ref 0–40)
Albumin: 4.7 g/dL (ref 3.8–4.8)
Alkaline Phosphatase: 36 IU/L — ABNORMAL LOW (ref 44–121)
BUN/Creatinine Ratio: 22 (ref 12–28)
BUN: 34 mg/dL — ABNORMAL HIGH (ref 8–27)
Bilirubin Total: 0.2 mg/dL (ref 0.0–1.2)
CO2: 22 mmol/L (ref 20–29)
Calcium: 9.1 mg/dL (ref 8.7–10.3)
Chloride: 105 mmol/L (ref 96–106)
Creatinine, Ser: 1.56 mg/dL — ABNORMAL HIGH (ref 0.57–1.00)
Globulin, Total: 2.1 g/dL (ref 1.5–4.5)
Glucose: 136 mg/dL — ABNORMAL HIGH (ref 70–99)
Potassium: 4.3 mmol/L (ref 3.5–5.2)
Sodium: 145 mmol/L — ABNORMAL HIGH (ref 134–144)
Total Protein: 6.8 g/dL (ref 6.0–8.5)
eGFR: 34 mL/min/{1.73_m2} — ABNORMAL LOW (ref >59)

## 2023-07-14 LAB — CBC WITH DIFFERENTIAL/PLATELET
Basophils Absolute: 0 10*3/uL (ref 0.0–0.2)
Basos: 1 %
EOS (ABSOLUTE): 0.1 10*3/uL (ref 0.0–0.4)
Eos: 2 %
Hematocrit: 35.4 % (ref 34.0–46.6)
Hemoglobin: 11 g/dL — ABNORMAL LOW (ref 11.1–15.9)
Immature Grans (Abs): 0 10*3/uL (ref 0.0–0.1)
Immature Granulocytes: 0 %
Lymphocytes Absolute: 1.4 10*3/uL (ref 0.7–3.1)
Lymphs: 23 %
MCH: 29.6 pg (ref 26.6–33.0)
MCHC: 31.1 g/dL — ABNORMAL LOW (ref 31.5–35.7)
MCV: 95 fL (ref 79–97)
Monocytes Absolute: 0.4 10*3/uL (ref 0.1–0.9)
Monocytes: 7 %
Neutrophils Absolute: 4 10*3/uL (ref 1.4–7.0)
Neutrophils: 67 %
Platelets: 261 10*3/uL (ref 150–450)
RBC: 3.71 x10E6/uL — ABNORMAL LOW (ref 3.77–5.28)
RDW: 13.7 % (ref 11.7–15.4)
WBC: 5.9 10*3/uL (ref 3.4–10.8)

## 2023-07-14 LAB — LIPID PANEL
Chol/HDL Ratio: 3.9 ratio (ref 0.0–4.4)
Cholesterol, Total: 211 mg/dL — ABNORMAL HIGH (ref 100–199)
HDL: 54 mg/dL (ref >39)
LDL Chol Calc (NIH): 112 mg/dL — ABNORMAL HIGH (ref 0–99)
Triglycerides: 260 mg/dL — ABNORMAL HIGH (ref 0–149)
VLDL Cholesterol Cal: 45 mg/dL — ABNORMAL HIGH (ref 5–40)

## 2023-07-14 LAB — HEMOGLOBIN A1C
Est. average glucose Bld gHb Est-mCnc: 128 mg/dL
Hgb A1c MFr Bld: 6.1 % — ABNORMAL HIGH (ref 4.8–5.6)

## 2023-07-14 NOTE — Telephone Encounter (Signed)
Pt was called and vm was left, Information has been sent to nurse pool.   

## 2023-07-14 NOTE — Progress Notes (Signed)
Let pt know all labs stable diabetes at goal and under control

## 2023-07-14 NOTE — Progress Notes (Signed)
  Care Coordination  Outreach Note  07/14/2023 Name: Elizabeth Schwartz MRN: [id number] DOB: [birth year]   Care Coordination Outreach Attempts: An unsuccessful telephone outreach was attempted today to offer the patient information about available care coordination services.  Follow Up Plan:  Additional outreach attempts will be made to offer the patient care coordination information and services.   Encounter Outcome:  No Answer  Gwenevere Ghazi  Care Coordination Care Guide  Direct Dial: [phone]

## 2023-07-14 NOTE — Telephone Encounter (Signed)
-----   Message from Shan Levans sent at 07/14/2023  8:24 AM EDT ----- Let pt know all labs stable diabetes at goal and under control

## 2023-07-19 NOTE — Progress Notes (Signed)
  Care Coordination  Outreach Note  07/19/2023 Name: Elizabeth Schwartz MRN: [id number] DOB: 1946-02-24   Care Coordination Outreach Attempts: A second unsuccessful outreach was attempted today to offer the patient with information about available care coordination services.  Follow Up Plan:  No further outreach attempts will be made at this time. We have been unable to contact the patient to offer or enroll patient in care coordination services  Encounter Outcome:  No Answer   Gwenevere Ghazi  Care Coordination Care Guide  Direct Dial: [phone]

## 2023-07-20 ENCOUNTER — Other Ambulatory Visit: Payer: Self-pay | Admitting: Critical Care Medicine

## 2023-07-20 DIAGNOSIS — Z1231 Encounter for screening mammogram for malignant neoplasm of breast: Secondary | ICD-10-CM

## 2023-07-20 NOTE — Progress Notes (Signed)
This encounter was created in error - please disregard.

## 2023-07-21 NOTE — Progress Notes (Signed)
  Care Coordination  Outreach Note  07/21/2023 Name: Maryjoy Suggett MRN: [id number] DOB: 01/20/1946   Care Coordination Outreach Attempts: A third unsuccessful outreach was attempted today to offer the patient with information about available care coordination services.  Follow Up Plan:  No further outreach attempts will be made at this time. We have been unable to contact the patient to offer or enroll patient in care coordination services  Encounter Outcome:  No Answer   Gwenevere Ghazi  Care Coordination Care Guide  Direct Dial: [phone]

## 2023-07-26 NOTE — Telephone Encounter (Signed)
I called  Franki Monte- DSS Long Term Placement: [phone] to inquire if she has been able to meet with the patient and if she has any updated information to share.  Message left with call back requested.

## 2023-07-27 ENCOUNTER — Telehealth: Payer: Self-pay

## 2023-07-27 NOTE — Telephone Encounter (Signed)
Call received from [facility].  She explained that she has spoken to the patient as well as Tracy/ Resident Care Coordinator at patient's apartment complex.  Chastiny said she explained to the patient the advantages of placement in a facility due to concerns about her living on her own.  The patient told her that she wants to remain in her own apartment, she does not want to be around anyone.  She said she will cut back on her driving but wants to continue to drive.   She does not have Medicaid and Chastiny encouraged her to consider paying privately for an aide to provide some assistance during the week and the patient did not want anyone in her home.    Chastiny said that there is not anything DSS can do with the patient at this point unless it is determined that she does not have capacity to make her own decisions.  That determination would be made by a clinical provider.    Chastiny updated French Ana on the outcome of her conversation with the patient and said she would keep in touch with French Ana in the event circumstances change for the patient.

## 2023-07-27 NOTE — Telephone Encounter (Signed)
Copied from CRM [id number]. Topic: General - Other >> Jul 27, 2023 11:17 AM Lennox Pippins wrote: Conception Oms, with [facility] DSS has called, for Robyne Peers, in regards to patient. Per Chasity, patient has declined all placement services. Chasity's callback # [phone] .     Callback # [phone]

## 2023-07-27 NOTE — Telephone Encounter (Signed)
Call returned to [facility] , message left with call back requested.

## 2023-08-02 ENCOUNTER — Ambulatory Visit: Payer: Medicare HMO | Admitting: Critical Care Medicine

## 2023-08-02 NOTE — Progress Notes (Deleted)
Notes  Established Patient Office Visit  Subjective:  Patient ID: Elizabeth Schwartz, female    DOB: 09/17/1946  Age: 77 y.o. MRN: [id number]  No chief complaint on file.    HPI 09/15/21 Elizabeth Schwartz presents for primary care follow-up.  She has an issue with loose stools and bowel incontinence.  She is having to wear depends now.  She states there is mucus in the diarrhea.  Note we have worked this up in the past with stools for pathogens and this was negative.  Patient denies any nausea or vomiting or other GI complaints.  Patient also complains of chronic right hip and left knee pain.  She has not had any falls.  Patient also continues with her mental health medications.  She has history of diagnosed schizophrenia and neurocognitive disorder due to multiple etiologies.  Her diabetes is well controlled and on arrival hemoglobin A1c was 6.0.  Blood pressure is good at 117/67.  Patient's not had any flareups of her asthma as well.  Patient did have a Cologuard test and we did not receive the result the patient was told it was negative.  01/25/22 Patient returns in follow-up and has slight increase in memory difficulty otherwise doing reasonably well however on arrival blood pressure initially was elevated but on recheck was 124/84.  She did have a fall in November but has had no falls since that time it was because of a low blood pressure.  Her ramipril was discontinued she is off all blood pressure medicines now.  Blood sugar on arrival was 98.  She brings all her medications with her and we did a complete medication reconciliation at this visit.  The patient needs a Prevnar 20 vaccine and she will receive it.  She is up-to-date speed on all of her other primary care gaps Patient does have some chronic shoulder pain she has been followed by orthopedics plans to call them again she has had injections done  7/18 Patient seen in return follow-up he confirms that she did get severe myopathy with statins and  this is clearly documented in the chart.  She is taking in place of statins fish oil, Zetia, fenofibrate.  She is also trying to follow a healthier diet.  Patient is seen orthopedics recently had injection of the left shoulder with steroids this is improved her left shoulder pain.  She still has pain at the base of the neck.  On arrival blood pressure 124/76.  Her blood sugars at home have ranged 131 170. A1c is good at 5.9.  Patient does complain of some increased blurred vision.  She does have an eye doctor and she will need to see sooner.  Also history of iron deficiency and will need to have her renal function blood levels checked at this visit.  There are no other complaints.  She needs multiple medications refilled.  9/20  Patient seen in return follow-up from July visit on a short-term basis because of complaints of progression in memory loss.  Appears to be worse over the past several months.  She has been on Aricept she cannot tell this made any difference.  She has difficulty organizing her schedule and forgetting appointments.  She is doing her own pill organizer she claims she is putting the pills incorrectly out of the bottles.  She was however when we went over her medications somewhat confused as to what medications were for which purpose.  Note she is divorced from her husband she has no children her  siblings are all deceased she has 2 nieces living in [place] but they did not associate with her.  She does live in a retirement apartment [employer].  There is an Health and safety inspector that organizes events in the community center for the residents.  The apartment manager was concerned about her mental status and did notify us of need for this visit.  Patient did agree to and received her flu vaccine at this visit.  There are no other complaints.  A1c at the last visit was good at 5.9.  Patient was in the ER in December for a fall but hadHas not her fall since that  time.  11/17/22 Patient seen in return follow-up she still in an independent living apartment.  Her mental status has remained about the same however her apartment manager feels she would benefit from assisted living the patient agrees.  She likely needs memory care.  A1c on arrival 6.2 blood pressure is 130/80.  Patient needs a handicap plate renewed.  She needs refills on multiple medications.  I did complete total medication reconciliation and all medications the medication list are accurate.  She does use a pill organizer.  There are no other complaints.  She has had no falls since last visit.  01/19/23 Patient seen in return follow-up she fell next to the [facility] she bumped into another person on the sidewalk.  She injured her right toe.  No other injuries.  She denies other falls.  Today on arrival blood pressure soft 86/51.  She denies symptoms from this.  Recently saw neurology as documented below and they changed her to Namenda plus Aricept.  Patient is interested in assisted living and is going to tour one short-term.  03/29/23 Patient seen in return follow-up.  She was sent a request for her to get genetic labs but this appears to be a scam and I did not sign the order.  She is out of several medications needs refills.  Her memory continues to be a challenge and she now agrees to go to memory care.  We tried to refer her to [facility] house but there may be a barrier due to cost and lack of beds.  Patient brought a urine sample she had placed in expired medication bottle we could not except this and there is no indication for this.  Patient clearly has continued memory issues and cannot keep up with her matters as she is writing notes all over different pages that are ill legible.  8/29 Patient is seen in return follow-up her neurologic status has progressed.  She is having difficulty making and organizing her thoughts.  She got lost coming to the clinic today when she drove here.  She  is in an apartment complex and has some support but not significant.  She is subject to scam individuals trying to make money off her health.  She is not organized.  Just saw neurology 1 August as documented below. Currently her diabetes is well-controlled and she is on metformin alone and only needs blood sugar testing daily Neuro oV 06/21/23 oyce Paustian is a very pleasant 77 y.o. RH female with a history of hypertension, hyperlipidemia, schizophrenia, DM2, anemia, COPD, depression, arthritis-osteoporosis, asthma, chronic diarrhea, seen today in follow up for memory loss. Patient is currently on memantine 10 mg twice daily and on donepezil 10 mg  by PCP. Because of her chronic diarrhea, discussed holding her donepezil for 1 week and observe any GI improvement. Memory is stable,  with MMSE of 28/30 during this visit.      Follow up in 6  months. Continue Memantine 10 mg twice daily. Side effects were discussed  Hod donepezil for 1 week and observe. If diarrhea resolves or improves, then hold it and will entertain rivastigmine in the near future. Recommend good control of her cardiovascular risk factors Continue to control mood as per PCP and [facility] at [facility] Increase socialization  9/18   Past Medical History:  Diagnosis Date   Anemia    COPD (chronic obstructive pulmonary disease) (HCC)    Depression    Diabetes mellitus    GERD (gastroesophageal reflux disease)    Hyperlipidemia    Hypertension    Osteoporosis    Schizophrenia, schizo-affective (HCC)    SOB (shortness of breath) on exertion    Syncope 06/11/2019    Past Surgical History:  Procedure Laterality Date   BREAST BIOPSY Right 2017   benign   LAPAROSCOPY  1975   ABD pain   VAGINAL HYSTERECTOMY  1975    Family History  Problem Relation Age of Onset   Breast cancer Neg Hx     Social History   Socioeconomic History   Marital status: Single    Spouse name: Not on file   Number of children: 0   Years of education: 12    Highest education level: Not on file  Occupational History   Not on file  Tobacco Use   Smoking status: Former    Current packs/day: 0.00    Types: Cigarettes    Start date: 02/27/1997    Quit date: 02/28/2012    Years since quitting: 11.4   Smokeless tobacco: Former    Types: Associate Professor status: Never Used  Substance and Sexual Activity   Alcohol use: Not Currently    Comment: "socially"   Drug use: No   Sexual activity: Not Currently  Other Topics Concern   Not on file  Social History Narrative   Right handed   Drinks caffeine   Retired   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)   Social Determinants of Health   Financial Resource Strain: Low Risk  (03/22/2023)   Overall Financial Resource Strain (CARDIA)    Difficulty of Paying Living Expenses: Not hard at all  Food Insecurity: No Food Insecurity (03/22/2023)   Hunger Vital Sign    Worried About Running Out of Food in the Last Year: Never true    Ran Out of Food in the Last Year: Never true  Transportation Needs: No Transportation Needs (03/22/2023)   PRAPARE - Administrator, Civil Service (Medical): No    Lack of Transportation (Non-Medical): No  Physical Activity: Insufficiently Active (03/22/2023)   Exercise Vital Sign    Days of Exercise per Week: 3 days    Minutes of Exercise per Session: 30 min  Stress: No Stress Concern Present (03/22/2023)   [employer] of Occupational Health - Occupational Stress Questionnaire    Feeling of Stress : Not at all  Social Connections: Socially Isolated (03/22/2023)   Social Connection and Isolation Panel [NHANES]    Frequency of Communication with Friends and Family: Three times a week    Frequency of Social Gatherings with Friends and Family: Never    Attends Religious Services: Never    Database administrator or Organizations: No    Attends Banker Meetings: Never    Marital Status: Never married  Intimate  Partner Violence: Not At Risk (03/22/2023)   Humiliation, Afraid, Rape, and Kick questionnaire    Fear of Current or Ex-Partner: No    Emotionally Abused: No    Physically Abused: No    Sexually Abused: No    Outpatient Medications Prior to Visit  Medication Sig Dispense Refill   acetaminophen (TYLENOL) 500 MG tablet Take 500 mg by mouth 2 (two) times daily.     albuterol (VENTOLIN HFA) 108 (90 Base) MCG/ACT inhaler INHALE TWO PUFFS BY MOUTH EVERY 6 HOURS AS NEEDED FOR SHORTNESS OF BREATH 18 g 3   alendronate (FOSAMAX) 70 MG tablet Take 1 tablet (70 mg total) by mouth once a week. 12 tablet 3   Blood Glucose Monitoring Suppl (ONETOUCH VERIO) w/Device KIT Use to check blood sugar 1 kit 0   calcium carbonate (OS-CAL) 600 MG TABS Take 600 mg by mouth 2 (two) times daily with a meal.     donepezil (ARICEPT) 10 MG tablet Take 1 tablet (10 mg total) by mouth at bedtime. 90 tablet 3   ezetimibe (ZETIA) 10 MG tablet Take 1 tablet (10 mg total) by mouth daily. 90 tablet 3   famotidine (PEPCID) 20 MG tablet Take 1 tablet (20 mg total) by mouth 2 (two) times daily. 180 tablet 0   fenofibrate 160 MG tablet Take 1 tablet (160 mg total) by mouth daily. 60 tablet 6   fish oil-omega-3 fatty acids 1000 MG capsule Take 1 g by mouth every morning.     Iron, Ferrous Sulfate, 325 (65 Fe) MG TABS Take 325 mg by mouth daily with breakfast. 60 tablet 4   metFORMIN (GLUCOPHAGE) 500 MG tablet Take 1 tablet (500 mg total) by mouth 2 (two) times daily with a meal. 180 tablet 2   Multiple Vitamins-Minerals (CENTRUM SILVER ULTRA WOMENS PO) Take 1 tablet by mouth daily.     OneTouch Delica Lancets 33G MISC Use to check blood sugar daily 100 each 1   ONETOUCH VERIO test strip Use as instructed 100 each 2   SYMBICORT 160-4.5 MCG/ACT inhaler INHALE TWO PUFFS INTO THE LUNGS IN THE MORNING AND AT BEDTIME 10.2 g 2   vitamin C (ASCORBIC ACID) 500 MG tablet Take 500 mg by mouth every morning.     VITAMIN D, CHOLECALCIFEROL, PO  Take 600 Units by mouth daily.     No facility-administered medications prior to visit.    Allergies  Allergen Reactions   Crestor [Rosuvastatin]     Muscle aches.     ROS Review of Systems  Constitutional: Negative.   HENT: Negative.  Negative for ear pain, postnasal drip, rhinorrhea, sinus pressure, sore throat, trouble swallowing and voice change.   Eyes:  Positive for visual disturbance.  Respiratory: Negative.  Negative for apnea, cough, choking, chest tightness, shortness of breath, wheezing and stridor.   Cardiovascular: Negative.  Negative for chest pain, palpitations and leg swelling.  Gastrointestinal:  Negative for abdominal distention, abdominal pain, anal bleeding, blood in stool, constipation, diarrhea, nausea, rectal pain and vomiting.  Genitourinary: Negative.   Musculoskeletal:  Positive for arthralgias. Negative for myalgias.       Multiple joint pains  Skin: Negative.  Negative for rash.  Allergic/Immunologic: Negative.  Negative for environmental allergies and food allergies.  Neurological:  Positive for speech difficulty and weakness. Negative for dizziness, tremors, seizures, syncope, facial asymmetry, light-headedness, numbness and headaches.       Poor short-term memory  Hematological: Negative.  Negative for adenopathy. Does not bruise/bleed easily.  Psychiatric/Behavioral:  Positive for confusion and decreased concentration. Negative for agitation, dysphoric mood, hallucinations, self-injury, sleep disturbance and suicidal ideas. The patient is not nervous/anxious and is not hyperactive.       Objective:    Physical Exam Vitals reviewed.  Constitutional:      Appearance: Normal appearance. She is well-developed. She is not diaphoretic.  HENT:     Head: Normocephalic and atraumatic.     Nose: Nose normal. No nasal deformity, septal deviation, mucosal edema or rhinorrhea.     Right Sinus: No maxillary sinus tenderness or frontal sinus tenderness.      Left Sinus: No maxillary sinus tenderness or frontal sinus tenderness.     Mouth/Throat:     Mouth: Mucous membranes are moist.     Pharynx: Oropharynx is clear. No oropharyngeal exudate.  Eyes:     General: No scleral icterus.       Right eye: No discharge.        Left eye: No discharge.     Extraocular Movements: Extraocular movements intact.     Conjunctiva/sclera: Conjunctivae normal.     Pupils: Pupils are equal, round, and reactive to light.     Comments: No visual field cuts  Neck:     Thyroid: No thyromegaly.     Vascular: No carotid bruit or JVD.     Trachea: Trachea normal. No tracheal tenderness or tracheal deviation.  Cardiovascular:     Rate and Rhythm: Normal rate and regular rhythm.     Chest Wall: PMI is not displaced.     Pulses: Normal pulses. No decreased pulses.     Heart sounds: Normal heart sounds, S1 normal and S2 normal. Heart sounds not distant. No murmur heard.    No systolic murmur is present.     No diastolic murmur is present.     No friction rub. No gallop. No S3 or S4 sounds.  Pulmonary:     Effort: No tachypnea, accessory muscle usage or respiratory distress.     Breath sounds: No stridor. No decreased breath sounds, wheezing, rhonchi or rales.  Chest:     Chest wall: No tenderness.  Abdominal:     General: Bowel sounds are normal. There is no distension.     Palpations: Abdomen is soft. Abdomen is not rigid.     Tenderness: There is no abdominal tenderness. There is no guarding or rebound.  Musculoskeletal:        General: Normal range of motion.     Cervical back: Normal range of motion and neck supple. No edema, erythema or rigidity. No muscular tenderness. Normal range of motion.     Comments: No bruising or injury to the right great toe  Lymphadenopathy:     Head:     Right side of head: No submental or submandibular adenopathy.     Left side of head: No submental or submandibular adenopathy.     Cervical: No cervical adenopathy.  Skin:     General: Skin is warm and dry.     Coloration: Skin is not pale.     Findings: No rash.     Nails: There is no clubbing.  Neurological:     General: No focal deficit present.     Mental Status: She is alert and oriented to person, place, and time.     Cranial Nerves: Cranial nerves 2-12 are intact. No cranial nerve deficit.     Sensory: Sensation is intact. No sensory deficit.     Motor:  Motor function is intact. No weakness.     Coordination: Coordination is intact. Coordination normal.     Gait: Gait is intact. Gait normal.     Deep Tendon Reflexes: Reflexes normal.  Psychiatric:        Attention and Perception: Attention and perception normal.        Mood and Affect: Mood and affect normal.        Speech: Speech normal.        Behavior: Behavior normal.        Thought Content: Thought content normal.        Cognition and Memory: Cognition is impaired. Memory is impaired. She exhibits impaired recent memory and impaired remote memory.        Judgment: Judgment normal.   Get up and go test was normal  There were no vitals taken for this visit. Wt Readings from Last 3 Encounters:  07/13/23 165 lb 3.2 oz (74.9 kg)  06/21/23 164 lb (74.4 kg)  03/29/23 166 lb (75.3 kg)     Health Maintenance Due  Topic Date Due   INFLUENZA VACCINE  06/15/2023   COVID-19 Vaccine (6 - 2023-24 season) 07/16/2023      There are no preventive care reminders to display for this patient.  Lab Results  Component Value Date   TSH 1.90 08/11/2022   Lab Results  Component Value Date   WBC 5.9 07/13/2023   HGB 11.0 (L) 07/13/2023   HCT 35.4 07/13/2023   MCV 95 07/13/2023   PLT 261 07/13/2023   Lab Results  Component Value Date   NA 145 (H) 07/13/2023   K 4.3 07/13/2023   CO2 22 07/13/2023   GLUCOSE 136 (H) 07/13/2023   BUN 34 (H) 07/13/2023   CREATININE 1.56 (H) 07/13/2023   BILITOT 0.2 07/13/2023   ALKPHOS 36 (L) 07/13/2023   AST 21 07/13/2023   ALT 25 07/13/2023   PROT 6.8  07/13/2023   ALBUMIN 4.7 07/13/2023   CALCIUM 9.1 07/13/2023   ANIONGAP 15 03/25/2019   EGFR 34 (L) 07/13/2023   Lab Results  Component Value Date   CHOL 211 (H) 07/13/2023   Lab Results  Component Value Date   HDL 54 07/13/2023   Lab Results  Component Value Date   LDLCALC 112 (H) 07/13/2023   Lab Results  Component Value Date   TRIG 260 (H) 07/13/2023   Lab Results  Component Value Date   CHOLHDL 3.9 07/13/2023   Lab Results  Component Value Date   HGBA1C 6.1 (H) 07/13/2023      Assessment & Plan:   Problem List Items Addressed This Visit   None    No orders of the defined types were placed in this encounter.  Follow-up: No follow-ups on file.    Shan Levans, MD

## 2023-08-09 ENCOUNTER — Inpatient Hospital Stay (HOSPITAL_COMMUNITY)
Admission: EM | Admit: 2023-08-09 | Discharge: 2023-08-18 | DRG: 963 | Disposition: A | Discharge status: Skilled Nursing Facility | Payer: Medicare Other | Attending: Internal Medicine | Admitting: Internal Medicine

## 2023-08-09 ENCOUNTER — Emergency Department (HOSPITAL_COMMUNITY): Payer: Medicare Other

## 2023-08-09 ENCOUNTER — Encounter (HOSPITAL_COMMUNITY): Payer: Self-pay | Admitting: Emergency Medicine

## 2023-08-09 DIAGNOSIS — R7989 Other specified abnormal findings of blood chemistry: Secondary | ICD-10-CM | POA: Diagnosis present

## 2023-08-09 DIAGNOSIS — R339 Retention of urine, unspecified: Secondary | ICD-10-CM | POA: Diagnosis not present

## 2023-08-09 DIAGNOSIS — S7712XA Crushing injury of left thigh, initial encounter: Principal | ICD-10-CM | POA: Diagnosis present

## 2023-08-09 DIAGNOSIS — Y92030 Kitchen in apartment as the place of occurrence of the external cause: Secondary | ICD-10-CM

## 2023-08-09 DIAGNOSIS — G309 Alzheimer's disease, unspecified: Secondary | ICD-10-CM | POA: Diagnosis not present

## 2023-08-09 DIAGNOSIS — S299XXA Unspecified injury of thorax, initial encounter: Secondary | ICD-10-CM | POA: Diagnosis not present

## 2023-08-09 DIAGNOSIS — L89896 Pressure-induced deep tissue damage of other site: Secondary | ICD-10-CM | POA: Diagnosis present

## 2023-08-09 DIAGNOSIS — R000 Tachycardia, unspecified: Secondary | ICD-10-CM | POA: Diagnosis not present

## 2023-08-09 DIAGNOSIS — I119 Hypertensive heart disease without heart failure: Secondary | ICD-10-CM | POA: Diagnosis not present

## 2023-08-09 DIAGNOSIS — R531 Weakness: Secondary | ICD-10-CM | POA: Diagnosis present

## 2023-08-09 DIAGNOSIS — W19XXXA Unspecified fall, initial encounter: Secondary | ICD-10-CM | POA: Diagnosis present

## 2023-08-09 DIAGNOSIS — Z7951 Long term (current) use of inhaled steroids: Secondary | ICD-10-CM

## 2023-08-09 DIAGNOSIS — R4182 Altered mental status, unspecified: Secondary | ICD-10-CM

## 2023-08-09 DIAGNOSIS — E872 Acidosis, unspecified: Secondary | ICD-10-CM | POA: Diagnosis not present

## 2023-08-09 DIAGNOSIS — E1121 Type 2 diabetes mellitus with diabetic nephropathy: Secondary | ICD-10-CM | POA: Diagnosis not present

## 2023-08-09 DIAGNOSIS — Z7984 Long term (current) use of oral hypoglycemic drugs: Secondary | ICD-10-CM | POA: Diagnosis not present

## 2023-08-09 DIAGNOSIS — G319 Degenerative disease of nervous system, unspecified: Secondary | ICD-10-CM | POA: Diagnosis not present

## 2023-08-09 DIAGNOSIS — W230XXA Caught, crushed, jammed, or pinched between moving objects, initial encounter: Secondary | ICD-10-CM | POA: Diagnosis present

## 2023-08-09 DIAGNOSIS — T796XXA Traumatic ischemia of muscle, initial encounter: Secondary | ICD-10-CM | POA: Diagnosis not present

## 2023-08-09 DIAGNOSIS — R6889 Other general symptoms and signs: Secondary | ICD-10-CM | POA: Diagnosis not present

## 2023-08-09 DIAGNOSIS — R7401 Elevation of levels of liver transaminase levels: Secondary | ICD-10-CM | POA: Diagnosis present

## 2023-08-09 DIAGNOSIS — M810 Age-related osteoporosis without current pathological fracture: Secondary | ICD-10-CM | POA: Diagnosis present

## 2023-08-09 DIAGNOSIS — Z7983 Long term (current) use of bisphosphonates: Secondary | ICD-10-CM

## 2023-08-09 DIAGNOSIS — S79822A Other specified injuries of left thigh, initial encounter: Secondary | ICD-10-CM | POA: Diagnosis not present

## 2023-08-09 DIAGNOSIS — H579 Unspecified disorder of eye and adnexa: Secondary | ICD-10-CM | POA: Diagnosis not present

## 2023-08-09 DIAGNOSIS — Z743 Need for continuous supervision: Secondary | ICD-10-CM | POA: Diagnosis not present

## 2023-08-09 DIAGNOSIS — I6782 Cerebral ischemia: Secondary | ICD-10-CM | POA: Diagnosis not present

## 2023-08-09 DIAGNOSIS — Z888 Allergy status to other drugs, medicaments and biological substances status: Secondary | ICD-10-CM

## 2023-08-09 DIAGNOSIS — S199XXA Unspecified injury of neck, initial encounter: Secondary | ICD-10-CM | POA: Diagnosis not present

## 2023-08-09 DIAGNOSIS — S0990XA Unspecified injury of head, initial encounter: Secondary | ICD-10-CM | POA: Diagnosis not present

## 2023-08-09 DIAGNOSIS — F32A Depression, unspecified: Secondary | ICD-10-CM | POA: Diagnosis present

## 2023-08-09 DIAGNOSIS — Z90710 Acquired absence of both cervix and uterus: Secondary | ICD-10-CM

## 2023-08-09 DIAGNOSIS — M549 Dorsalgia, unspecified: Secondary | ICD-10-CM | POA: Diagnosis not present

## 2023-08-09 DIAGNOSIS — M6282 Rhabdomyolysis: Secondary | ICD-10-CM | POA: Diagnosis not present

## 2023-08-09 DIAGNOSIS — F0280 Dementia in other diseases classified elsewhere without behavioral disturbance: Secondary | ICD-10-CM | POA: Diagnosis present

## 2023-08-09 DIAGNOSIS — E785 Hyperlipidemia, unspecified: Secondary | ICD-10-CM | POA: Diagnosis present

## 2023-08-09 DIAGNOSIS — N179 Acute kidney failure, unspecified: Secondary | ICD-10-CM | POA: Diagnosis present

## 2023-08-09 DIAGNOSIS — K219 Gastro-esophageal reflux disease without esophagitis: Secondary | ICD-10-CM | POA: Diagnosis not present

## 2023-08-09 DIAGNOSIS — L8995 Pressure ulcer of unspecified site, unstageable: Secondary | ICD-10-CM | POA: Diagnosis not present

## 2023-08-09 DIAGNOSIS — S3991XA Unspecified injury of abdomen, initial encounter: Secondary | ICD-10-CM | POA: Diagnosis not present

## 2023-08-09 DIAGNOSIS — J189 Pneumonia, unspecified organism: Secondary | ICD-10-CM | POA: Diagnosis not present

## 2023-08-09 DIAGNOSIS — S3993XA Unspecified injury of pelvis, initial encounter: Secondary | ICD-10-CM | POA: Diagnosis not present

## 2023-08-09 DIAGNOSIS — I700 Atherosclerosis of aorta: Secondary | ICD-10-CM | POA: Diagnosis not present

## 2023-08-09 DIAGNOSIS — Z1152 Encounter for screening for COVID-19: Secondary | ICD-10-CM | POA: Diagnosis not present

## 2023-08-09 DIAGNOSIS — K8020 Calculus of gallbladder without cholecystitis without obstruction: Secondary | ICD-10-CM | POA: Diagnosis present

## 2023-08-09 DIAGNOSIS — F259 Schizoaffective disorder, unspecified: Secondary | ICD-10-CM | POA: Diagnosis present

## 2023-08-09 DIAGNOSIS — L8996 Pressure-induced deep tissue damage of unspecified site: Secondary | ICD-10-CM | POA: Diagnosis present

## 2023-08-09 DIAGNOSIS — Z043 Encounter for examination and observation following other accident: Secondary | ICD-10-CM | POA: Diagnosis not present

## 2023-08-09 DIAGNOSIS — R918 Other nonspecific abnormal finding of lung field: Secondary | ICD-10-CM | POA: Diagnosis not present

## 2023-08-09 DIAGNOSIS — J440 Chronic obstructive pulmonary disease with acute lower respiratory infection: Secondary | ICD-10-CM | POA: Diagnosis present

## 2023-08-09 DIAGNOSIS — F0283 Dementia in other diseases classified elsewhere, unspecified severity, with mood disturbance: Secondary | ICD-10-CM | POA: Diagnosis present

## 2023-08-09 DIAGNOSIS — F02818 Dementia in other diseases classified elsewhere, unspecified severity, with other behavioral disturbance: Secondary | ICD-10-CM | POA: Diagnosis present

## 2023-08-09 DIAGNOSIS — R55 Syncope and collapse: Secondary | ICD-10-CM | POA: Diagnosis not present

## 2023-08-09 DIAGNOSIS — Z79899 Other long term (current) drug therapy: Secondary | ICD-10-CM

## 2023-08-09 DIAGNOSIS — E119 Type 2 diabetes mellitus without complications: Secondary | ICD-10-CM | POA: Diagnosis not present

## 2023-08-09 DIAGNOSIS — I517 Cardiomegaly: Secondary | ICD-10-CM | POA: Diagnosis not present

## 2023-08-09 DIAGNOSIS — R404 Transient alteration of awareness: Secondary | ICD-10-CM | POA: Diagnosis not present

## 2023-08-09 HISTORY — DX: Unspecified dementia, unspecified severity, without behavioral disturbance, psychotic disturbance, mood disturbance, and anxiety: F03.90

## 2023-08-09 LAB — CBC WITH DIFFERENTIAL/PLATELET
Abs Immature Granulocytes: 0.12 10*3/uL — ABNORMAL HIGH (ref 0.00–0.07)
Basophils Absolute: 0 10*3/uL (ref 0.0–0.1)
Basophils Relative: 0 %
Eosinophils Absolute: 0 10*3/uL (ref 0.0–0.5)
Eosinophils Relative: 0 %
HCT: 41.8 % (ref 36.0–46.0)
Hemoglobin: 13 g/dL (ref 12.0–15.0)
Immature Granulocytes: 1 %
Lymphocytes Relative: 5 %
Lymphs Abs: 0.8 10*3/uL (ref 0.7–4.0)
MCH: 29.5 pg (ref 26.0–34.0)
MCHC: 31.1 g/dL (ref 30.0–36.0)
MCV: 95 fL (ref 80.0–100.0)
Monocytes Absolute: 1.3 10*3/uL — ABNORMAL HIGH (ref 0.1–1.0)
Monocytes Relative: 9 %
Neutro Abs: 12.1 10*3/uL — ABNORMAL HIGH (ref 1.7–7.7)
Neutrophils Relative %: 85 %
Platelets: 283 10*3/uL (ref 150–400)
RBC: 4.4 MIL/uL (ref 3.87–5.11)
RDW: 14.5 % (ref 11.5–15.5)
WBC: 14.4 10*3/uL — ABNORMAL HIGH (ref 4.0–10.5)
nRBC: 0 % (ref 0.0–0.2)

## 2023-08-09 LAB — I-STAT CG4 LACTIC ACID, ED
Lactic Acid, Venous: 3.5 mmol/L (ref 0.5–1.9)
Lactic Acid, Venous: 5.2 mmol/L (ref 0.5–1.9)

## 2023-08-09 LAB — URINALYSIS, W/ REFLEX TO CULTURE (INFECTION SUSPECTED)
Bacteria, UA: NONE SEEN
Bilirubin Urine: NEGATIVE
Glucose, UA: NEGATIVE mg/dL
Ketones, ur: 5 mg/dL — AB
Leukocytes,Ua: NEGATIVE
Nitrite: NEGATIVE
Protein, ur: 30 mg/dL — AB
Specific Gravity, Urine: 1.018 (ref 1.005–1.030)
pH: 5 (ref 5.0–8.0)

## 2023-08-09 LAB — CK: Total CK: 19249 U/L — ABNORMAL HIGH (ref 38–234)

## 2023-08-09 LAB — COMPREHENSIVE METABOLIC PANEL
ALT: 115 U/L — ABNORMAL HIGH (ref 0–44)
AST: 368 U/L — ABNORMAL HIGH (ref 15–41)
Albumin: 3.8 g/dL (ref 3.5–5.0)
Alkaline Phosphatase: 53 U/L (ref 38–126)
Anion gap: 18 — ABNORMAL HIGH (ref 5–15)
BUN: 43 mg/dL — ABNORMAL HIGH (ref 8–23)
CO2: 18 mmol/L — ABNORMAL LOW (ref 22–32)
Calcium: 8.7 mg/dL — ABNORMAL LOW (ref 8.9–10.3)
Chloride: 106 mmol/L (ref 98–111)
Creatinine, Ser: 1.4 mg/dL — ABNORMAL HIGH (ref 0.44–1.00)
GFR, Estimated: 39 mL/min — ABNORMAL LOW (ref >60)
Glucose, Bld: 150 mg/dL — ABNORMAL HIGH (ref 70–99)
Potassium: 4.1 mmol/L (ref 3.5–5.1)
Sodium: 142 mmol/L (ref 135–145)
Total Bilirubin: 1.1 mg/dL (ref 0.3–1.2)
Total Protein: 7.5 g/dL (ref 6.5–8.1)

## 2023-08-09 LAB — PROTIME-INR
INR: 1.1 (ref 0.8–1.2)
Prothrombin Time: 14.6 seconds (ref 11.4–15.2)

## 2023-08-09 LAB — APTT: aPTT: 20 seconds — ABNORMAL LOW (ref 24–36)

## 2023-08-09 MED ORDER — LACTATED RINGERS IV BOLUS (SEPSIS)
1000.0000 mL | Freq: Once | INTRAVENOUS | Status: AC
  Administered 2023-08-09: 1000 mL via INTRAVENOUS

## 2023-08-09 MED ORDER — LACTATED RINGERS IV SOLN
INTRAVENOUS | Status: DC
  Administered 2023-08-09: 500 mL via INTRAVENOUS
  Administered 2023-08-10: 1000 mL via INTRAVENOUS

## 2023-08-09 MED ORDER — FENTANYL CITRATE PF 50 MCG/ML IJ SOSY
50.0000 ug | PREFILLED_SYRINGE | Freq: Once | INTRAMUSCULAR | Status: AC
  Administered 2023-08-09: 50 ug via INTRAVENOUS
  Filled 2023-08-09: qty 1

## 2023-08-09 MED ORDER — LACTATED RINGERS IV BOLUS (SEPSIS)
500.0000 mL | Freq: Once | INTRAVENOUS | Status: AC
  Administered 2023-08-09: 500 mL via INTRAVENOUS

## 2023-08-09 MED ORDER — SODIUM BICARBONATE 8.4 % IV SOLN
50.0000 meq | Freq: Once | INTRAVENOUS | Status: AC
  Administered 2023-08-10: 50 meq via INTRAVENOUS
  Filled 2023-08-09: qty 50

## 2023-08-09 MED ORDER — SODIUM CHLORIDE 0.9 % IV BOLUS: 1000.0000 mL | Freq: Once | INTRAVENOUS | Status: DC

## 2023-08-09 MED ORDER — SODIUM CHLORIDE 0.9 % IV SOLN
2.0000 g | INTRAVENOUS | Status: AC
  Administered 2023-08-09 – 2023-08-13 (×5): 2 g via INTRAVENOUS
  Filled 2023-08-09 (×5): qty 20

## 2023-08-09 NOTE — ED Provider Notes (Signed)
77 year old female, found down on well fare check after neighbor hadn't seen her for a few days. Found with the oven door crushing her thigh. Complaint of abdominal discomfort.  Skin break down on left thigh, pulses intact, compartments soft. CK significantly elevated. Elevated lactic, increasing.  Rocephin given for possible PNA on chest CT. Consulted Dr. Julian Reil with [facility] who recommends consult critical care.  Dr. Trudie Buckler recommends stopping the bicarb, continue fluids at 18ml/hr, repeat lactic. If uptrending, notify critical care, if improving, notify hospitalist.   Physical Exam  BP (!) 140/78   Pulse (!) 117   Temp (!) 97.1 F (36.2 C) (Rectal)   Resp (!) 21   Ht 5\' 5"  (1.651 m)   Wt 76.4 kg   SpO2 97%   BMI 28.03 kg/m   Physical Exam  Procedures  Procedures  ED Course / MDM    Medical Decision Making Amount and/or Complexity of Data Reviewed Labs: ordered. Radiology: ordered.  Risk Prescription drug management.   Lactic acid is improving, down to 3.2 from 5.2. Discussed with Dr. Julian Reil with [facility] who will admit. Discussed with Dr. Elayne Guerin with cardiology regarding elevated trop at 320, cardiology will consult, requests trop trend.        Jeannie Fend, PA-C 08/10/23 0134    Dione Booze, MD 08/10/23 [phone]

## 2023-08-09 NOTE — ED Triage Notes (Signed)
Pt arrived from home via EMS with c/o fall. Unknown date or time. Neighbors called for a welfare check because they haven't seen pt in one to two days. Pt has hx of dementia. Lives alone EMS found pt on the floor in the kitchen with stove on top of her. Soiled in urine. Wound present to left thigh GCS 14. 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

## 2023-08-09 NOTE — ED Notes (Signed)
Pt returned back to room from CT

## 2023-08-09 NOTE — ED Notes (Signed)
Patient transported to CT 

## 2023-08-09 NOTE — ED Notes (Signed)
ED tech at bedside at this time attempting to collect second set of blood cultures.

## 2023-08-09 NOTE — ED Notes (Signed)
Patient transported to X-ray 

## 2023-08-09 NOTE — ED Provider Notes (Signed)
[place] EMERGENCY DEPARTMENT AT [facility] Provider Note   CSN: [id number] Arrival date & time: 08/09/23  1914     History  Chief Complaint  Patient presents with   Marletta Lor    Lannette Sarasin is a 77 y.o. female.  The history is provided by the patient, the EMS personnel and medical records. No language interpreter was used.  Fall     Patient is a 77 year old female presenting to the ED via EMS after a fall. EMS was called after a neighbor called for a welfare check on patient after she had not been seen in 1 to 2 days. Upon arrival EMS found patient on the ground with a stove on top of her. Unknown exactly how long patient had been on the ground for. Patient has a history of dementia and history was limited by patient's confusion. She reports chills, SOB but states that she "does not hurt anywhere." History is limited due to baseline dementia  Home Medications Prior to Admission medications   Medication Sig Start Date End Date Taking? Authorizing Provider  acetaminophen (TYLENOL) 500 MG tablet Take 500 mg by mouth 2 (two) times daily.    [provider]  albuterol (VENTOLIN HFA) 108 (90 Base) MCG/ACT inhaler INHALE TWO PUFFS BY MOUTH EVERY 6 HOURS AS NEEDED FOR SHORTNESS OF BREATH 07/13/23   Storm Frisk, MD  alendronate (FOSAMAX) 70 MG tablet Take 1 tablet (70 mg total) by mouth once a week. 07/13/23   Storm Frisk, MD  Blood Glucose Monitoring Suppl [facility] VERIO) w/Device KIT Use to check blood sugar 07/13/23   Storm Frisk, MD  calcium carbonate (OS-CAL) 600 MG TABS Take 600 mg by mouth 2 (two) times daily with a meal.    [provider]  donepezil (ARICEPT) 10 MG tablet Take 1 tablet (10 mg total) by mouth at bedtime. 07/13/23   Storm Frisk, MD  ezetimibe (ZETIA) 10 MG tablet Take 1 tablet (10 mg total) by mouth daily. 07/13/23   Storm Frisk, MD  famotidine (PEPCID) 20 MG tablet Take 1 tablet (20 mg total) by mouth 2 (two) times  daily. 07/13/23   Storm Frisk, MD  fenofibrate 160 MG tablet Take 1 tablet (160 mg total) by mouth daily. 07/13/23   Storm Frisk, MD  fish oil-omega-3 fatty acids 1000 MG capsule Take 1 g by mouth every morning.    [provider]  Iron, Ferrous Sulfate, 325 (65 Fe) MG TABS Take 325 mg by mouth daily with breakfast. 11/17/22   Storm Frisk, MD  metFORMIN (GLUCOPHAGE) 500 MG tablet Take 1 tablet (500 mg total) by mouth 2 (two) times daily with a meal. 07/13/23   Storm Frisk, MD  Multiple Vitamins-Minerals (CENTRUM SILVER ULTRA WOMENS PO) Take 1 tablet by mouth daily.    [provider]  OneTouch Delica Lancets 33G MISC Use to check blood sugar daily 07/13/23   Storm Frisk, MD  [facility] VERIO test strip Use as instructed 07/13/23   Storm Frisk, MD  SYMBICORT 160-4.5 MCG/ACT inhaler INHALE TWO PUFFS INTO THE LUNGS IN THE MORNING AND AT BEDTIME 07/13/23   Storm Frisk, MD  vitamin C (ASCORBIC ACID) 500 MG tablet Take 500 mg by mouth every morning.    [provider]  VITAMIN D, CHOLECALCIFEROL, PO Take 600 Units by mouth daily.    [provider]      Allergies    Crestor [rosuvastatin]  Review of Systems   Review of Systems  Unable to perform ROS: Dementia    Physical Exam Updated Vital Signs BP (!) 110/92 (BP Location: Left Arm)   Pulse (!) 118   Temp 98.9 F (37.2 C) (Oral)   Resp 20   Ht 5\' 5"  (1.651 m)   Wt 76.4 kg   SpO2 100%   BMI 28.03 kg/m  Physical Exam Vitals and nursing note reviewed.  Constitutional:      General: She is not in acute distress.    Appearance: She is well-developed.     Comments: Elderly female, strong urine odor, laying in bed not in any apparent distress.  HENT:     Head: Normocephalic and atraumatic.     Comments: No scalp tenderness.  No midface tenderness.  No raccoon's eyes or Battle sign.    Mouth/Throat:     Mouth: Mucous membranes are dry.     Comments: Mouth is  dry Eyes:     Conjunctiva/sclera: Conjunctivae normal.  Neck:     Comments: C-collar in place.  No significant midline spine tenderness Cardiovascular:     Rate and Rhythm: Tachycardia present.     Pulses: Normal pulses.     Heart sounds: Normal heart sounds.  Pulmonary:     Effort: Pulmonary effort is normal.  Chest:     Chest wall: Tenderness (Diffuse tenderness about the chest wall without any signs of trauma.) present.  Abdominal:     Palpations: Abdomen is soft.     Tenderness: There is abdominal tenderness (Mild generalized abdominal tenderness no focal point tenderness).  Genitourinary:    Comments: Strong urine odor Musculoskeletal:        General: Signs of injury (Left lower extremity: There is a large pressure ulcer noted to medial thigh likely from the oven door handle.  Refer to picture below) present.     Cervical back: Neck supple.     Comments: Able to move her extremities with very poor effort  Skin:    Findings: No rash.  Neurological:     Mental Status: She is alert. She is disoriented.  Psychiatric:        Mood and Affect: Mood normal.          ED Results / Procedures / Treatments   Labs (all labs ordered are listed, but only abnormal results are displayed) Labs Reviewed  RESP PANEL BY RT-PCR (RSV, FLU A&B, COVID)  [id number]  CULTURE, BLOOD (ROUTINE X 2)  CULTURE, BLOOD (ROUTINE X 2)  COMPREHENSIVE METABOLIC PANEL  CBC WITH DIFFERENTIAL/PLATELET  PROTIME-INR  APTT  URINALYSIS, W/ REFLEX TO CULTURE (INFECTION SUSPECTED)  CK  I-STAT CG4 LACTIC ACID, ED    EKG None  Radiology No results found.  Procedures .Critical Care  Performed by: Fayrene Helper, PA-C Authorized by: Fayrene Helper, PA-C   Critical care provider statement:    Critical care time (minutes):  45   Critical care was time spent personally by me on the following activities:  Development of treatment plan with patient or surrogate, discussions with consultants, evaluation of  patient's response to treatment, examination of patient, ordering and review of laboratory studies, ordering and review of radiographic studies, ordering and performing treatments and interventions, pulse oximetry, re-evaluation of patient's condition and review of old charts     Medications Ordered in ED Medications  lactated ringers infusion (has no administration in time range)  lactated ringers bolus 1,000 mL (has no administration in time range)    And  lactated ringers bolus 1,000 mL (has no administration in time range)    And  lactated ringers bolus 500 mL (has no administration in time range)  cefTRIAXone (ROCEPHIN) 2 g in sodium chloride 0.9 % 100 mL IVPB (has no administration in time range)    ED Course/ Medical Decision Making/ A&P                                 Medical Decision Making Amount and/or Complexity of Data Reviewed Labs: ordered. Radiology: ordered. ECG/medicine tests: ordered.  Risk Prescription drug management.   BP 114/78   Pulse (!) 117   Temp 98.9 F (37.2 C) (Oral)   Resp 18   Ht 5\' 5"  (1.651 m)   Wt 76.4 kg   SpO2 100%   BMI 28.03 kg/m   9:02 PM Patient is a 77 year old female presenting to the ED via EMS after a fall. EMS was called after a neighbor called for a welfare check on patient after she had not been seen in 1 to 2 days. Upon arrival EMS found patient on the ground with a stove on top of her. Unknown exactly how long patient had been on the ground for. Patient has a history of dementia and history was limited by patient's confusion. She reports chills, SOB but states that she "does not hurt anywhere."  However history is limited.  Additional history obtained via EMS.  EMS report that when patient was found in the house after they had to break into the house, she was on the ground with an oven laying on top of her.  She was strong of urine odor.  On exam, elderly female alert and oriented to self and place but not to time location  and situation.  She is a poor historian.  She does not have any obvious signs of head and neck trauma.  Exam remarkable for a large pressure indention noted to her left medial thigh.  Skin breakdown noted to the lateral aspect of her left lower extremity.  She is having difficulty moving her left leg but able to move her left arm.  She has tenderness throughout her body most significant in her abdomen.  Vitals are notable for soft blood pressure, heart rate of 118, no fever, no hypoxia   -Labs ordered, independently viewed and interpreted by me.  Labs remarkable for lactic acid 3.5, however on repeat lactic acid is 5.2 despite receiving 2.5L of IVF.  Cr. 1.4.  evidence of transaminitis with AST 368 ALT 115.  WBC 14.4.  UA with large hgb but no UTI.  Total CK of 19k consistent with rhabdo -The patient was maintained on a cardiac monitor.  I personally viewed and interpreted the cardiac monitored which showed an underlying rhythm of: sinus tachycardia -Imaging independently viewed and interpreted by me and I agree with radiologist's interpretation.  Result remarkable for CXR showing new small left pleural effusion. Head/cspine/chest/abd/pelvis CT currently pending -This patient presents to the ED for concern of crush injury, this involves an extensive number of treatment options, and is a complaint that carries with it a high risk of complications and morbidity.  The differential diagnosis includes crush injury, rhabdomyelisis, UTI, PNA, stroke, MI, colitis, bowel ischemia -Co morbidities that complicate the patient evaluation includes dementia, anemia, schizophrenia, DM -Treatment includes aggressive fluid, rocephin, fentanyl -Reevaluation of the patient after these medicines showed that the patient stayed the same -PCP  office notes or outside notes reviewed -Discussion with specialist [facility] who request consultation to intensivist as pt's lactic acid is markly impaired despite fluid  resuscitation.  -Escalation to admission/observation considered: patient   11:56 PM Appreciate consultation from intensivist Dr. Gaynell Face who recommend continue with fluid resuscitation including another 1L of IVF and continuous maintenance at 150cc/hr. However pt is not a candidate for ICU.  Pt sign out to oncoming provider.         Final Clinical Impression(s) / ED Diagnoses Final diagnoses:  Crushing injury of left thigh, initial encounter  Altered mental status, unspecified altered mental status type    Rx / DC Orders ED Discharge Orders     None         Fayrene Helper, PA-C 08/09/23 2358    Elayne Snare K, DO 08/10/23 1502

## 2023-08-09 NOTE — ED Notes (Signed)
Pt returned back to ED from xray.

## 2023-08-09 NOTE — ED Notes (Signed)
CT notified that pt is now ready for CT imaging.

## 2023-08-10 DIAGNOSIS — M6281 Muscle weakness (generalized): Secondary | ICD-10-CM | POA: Diagnosis not present

## 2023-08-10 DIAGNOSIS — L8995 Pressure ulcer of unspecified site, unstageable: Secondary | ICD-10-CM

## 2023-08-10 DIAGNOSIS — M47817 Spondylosis without myelopathy or radiculopathy, lumbosacral region: Secondary | ICD-10-CM | POA: Diagnosis not present

## 2023-08-10 DIAGNOSIS — F02818 Dementia in other diseases classified elsewhere, unspecified severity, with other behavioral disturbance: Secondary | ICD-10-CM | POA: Diagnosis present

## 2023-08-10 DIAGNOSIS — N179 Acute kidney failure, unspecified: Secondary | ICD-10-CM | POA: Diagnosis present

## 2023-08-10 DIAGNOSIS — S3992XA Unspecified injury of lower back, initial encounter: Secondary | ICD-10-CM | POA: Diagnosis not present

## 2023-08-10 DIAGNOSIS — J440 Chronic obstructive pulmonary disease with acute lower respiratory infection: Secondary | ICD-10-CM | POA: Diagnosis not present

## 2023-08-10 DIAGNOSIS — Z7984 Long term (current) use of oral hypoglycemic drugs: Secondary | ICD-10-CM | POA: Diagnosis not present

## 2023-08-10 DIAGNOSIS — M6282 Rhabdomyolysis: Secondary | ICD-10-CM | POA: Diagnosis not present

## 2023-08-10 DIAGNOSIS — E872 Acidosis, unspecified: Secondary | ICD-10-CM

## 2023-08-10 DIAGNOSIS — M810 Age-related osteoporosis without current pathological fracture: Secondary | ICD-10-CM | POA: Diagnosis present

## 2023-08-10 DIAGNOSIS — R7989 Other specified abnormal findings of blood chemistry: Secondary | ICD-10-CM | POA: Diagnosis not present

## 2023-08-10 DIAGNOSIS — R531 Weakness: Secondary | ICD-10-CM | POA: Diagnosis not present

## 2023-08-10 DIAGNOSIS — I119 Hypertensive heart disease without heart failure: Secondary | ICD-10-CM | POA: Diagnosis not present

## 2023-08-10 DIAGNOSIS — E119 Type 2 diabetes mellitus without complications: Secondary | ICD-10-CM | POA: Diagnosis not present

## 2023-08-10 DIAGNOSIS — R1310 Dysphagia, unspecified: Secondary | ICD-10-CM | POA: Diagnosis not present

## 2023-08-10 DIAGNOSIS — Y92030 Kitchen in apartment as the place of occurrence of the external cause: Secondary | ICD-10-CM | POA: Diagnosis not present

## 2023-08-10 DIAGNOSIS — S79822A Other specified injuries of left thigh, initial encounter: Secondary | ICD-10-CM | POA: Diagnosis not present

## 2023-08-10 DIAGNOSIS — M6259 Muscle wasting and atrophy, not elsewhere classified, multiple sites: Secondary | ICD-10-CM | POA: Diagnosis not present

## 2023-08-10 DIAGNOSIS — R404 Transient alteration of awareness: Secondary | ICD-10-CM | POA: Diagnosis not present

## 2023-08-10 DIAGNOSIS — F259 Schizoaffective disorder, unspecified: Secondary | ICD-10-CM | POA: Diagnosis present

## 2023-08-10 DIAGNOSIS — D1809 Hemangioma of other sites: Secondary | ICD-10-CM | POA: Diagnosis not present

## 2023-08-10 DIAGNOSIS — K219 Gastro-esophageal reflux disease without esophagitis: Secondary | ICD-10-CM | POA: Diagnosis not present

## 2023-08-10 DIAGNOSIS — R29898 Other symptoms and signs involving the musculoskeletal system: Secondary | ICD-10-CM | POA: Diagnosis not present

## 2023-08-10 DIAGNOSIS — F0280 Dementia in other diseases classified elsewhere without behavioral disturbance: Secondary | ICD-10-CM

## 2023-08-10 DIAGNOSIS — Z7401 Bed confinement status: Secondary | ICD-10-CM | POA: Diagnosis not present

## 2023-08-10 DIAGNOSIS — G309 Alzheimer's disease, unspecified: Secondary | ICD-10-CM

## 2023-08-10 DIAGNOSIS — I10 Essential (primary) hypertension: Secondary | ICD-10-CM | POA: Diagnosis not present

## 2023-08-10 DIAGNOSIS — M25562 Pain in left knee: Secondary | ICD-10-CM | POA: Diagnosis not present

## 2023-08-10 DIAGNOSIS — W230XXA Caught, crushed, jammed, or pinched between moving objects, initial encounter: Secondary | ICD-10-CM | POA: Diagnosis present

## 2023-08-10 DIAGNOSIS — T796XXA Traumatic ischemia of muscle, initial encounter: Secondary | ICD-10-CM

## 2023-08-10 DIAGNOSIS — Z1152 Encounter for screening for COVID-19: Secondary | ICD-10-CM | POA: Diagnosis not present

## 2023-08-10 DIAGNOSIS — W19XXXA Unspecified fall, initial encounter: Secondary | ICD-10-CM | POA: Diagnosis present

## 2023-08-10 DIAGNOSIS — M546 Pain in thoracic spine: Secondary | ICD-10-CM | POA: Diagnosis not present

## 2023-08-10 DIAGNOSIS — R55 Syncope and collapse: Secondary | ICD-10-CM | POA: Diagnosis not present

## 2023-08-10 DIAGNOSIS — S7712XA Crushing injury of left thigh, initial encounter: Secondary | ICD-10-CM | POA: Diagnosis not present

## 2023-08-10 DIAGNOSIS — R6889 Other general symptoms and signs: Secondary | ICD-10-CM | POA: Diagnosis not present

## 2023-08-10 DIAGNOSIS — L89896 Pressure-induced deep tissue damage of other site: Secondary | ICD-10-CM | POA: Diagnosis present

## 2023-08-10 DIAGNOSIS — R7401 Elevation of levels of liver transaminase levels: Secondary | ICD-10-CM

## 2023-08-10 DIAGNOSIS — M47814 Spondylosis without myelopathy or radiculopathy, thoracic region: Secondary | ICD-10-CM | POA: Diagnosis not present

## 2023-08-10 DIAGNOSIS — L8996 Pressure-induced deep tissue damage of unspecified site: Secondary | ICD-10-CM | POA: Diagnosis present

## 2023-08-10 DIAGNOSIS — Z743 Need for continuous supervision: Secondary | ICD-10-CM | POA: Diagnosis not present

## 2023-08-10 DIAGNOSIS — J189 Pneumonia, unspecified organism: Secondary | ICD-10-CM | POA: Diagnosis present

## 2023-08-10 DIAGNOSIS — E785 Hyperlipidemia, unspecified: Secondary | ICD-10-CM | POA: Diagnosis not present

## 2023-08-10 DIAGNOSIS — K8020 Calculus of gallbladder without cholecystitis without obstruction: Secondary | ICD-10-CM | POA: Diagnosis present

## 2023-08-10 DIAGNOSIS — E1121 Type 2 diabetes mellitus with diabetic nephropathy: Secondary | ICD-10-CM | POA: Diagnosis not present

## 2023-08-10 DIAGNOSIS — R600 Localized edema: Secondary | ICD-10-CM | POA: Diagnosis not present

## 2023-08-10 DIAGNOSIS — Z90710 Acquired absence of both cervix and uterus: Secondary | ICD-10-CM | POA: Diagnosis not present

## 2023-08-10 DIAGNOSIS — F32A Depression, unspecified: Secondary | ICD-10-CM | POA: Diagnosis present

## 2023-08-10 DIAGNOSIS — E0865 Diabetes mellitus due to underlying condition with hyperglycemia: Secondary | ICD-10-CM | POA: Diagnosis not present

## 2023-08-10 DIAGNOSIS — M5136 Other intervertebral disc degeneration, lumbar region: Secondary | ICD-10-CM | POA: Diagnosis not present

## 2023-08-10 DIAGNOSIS — F0283 Dementia in other diseases classified elsewhere, unspecified severity, with mood disturbance: Secondary | ICD-10-CM | POA: Diagnosis present

## 2023-08-10 DIAGNOSIS — F82 Specific developmental disorder of motor function: Secondary | ICD-10-CM | POA: Diagnosis not present

## 2023-08-10 LAB — TROPONIN I (HIGH SENSITIVITY)
Troponin I (High Sensitivity): 360 ng/L (ref <18)
Troponin I (High Sensitivity): 319 ng/L (ref <18)

## 2023-08-10 LAB — COMPREHENSIVE METABOLIC PANEL
ALT: 94 U/L — ABNORMAL HIGH (ref 0–44)
AST: 316 U/L — ABNORMAL HIGH (ref 15–41)
Albumin: 2.9 g/dL — ABNORMAL LOW (ref 3.5–5.0)
Alkaline Phosphatase: 44 U/L (ref 38–126)
Anion gap: 10 (ref 5–15)
BUN: 37 mg/dL — ABNORMAL HIGH (ref 8–23)
CO2: 24 mmol/L (ref 22–32)
Calcium: 7.9 mg/dL — ABNORMAL LOW (ref 8.9–10.3)
Chloride: 107 mmol/L (ref 98–111)
Creatinine, Ser: 1.28 mg/dL — ABNORMAL HIGH (ref 0.44–1.00)
GFR, Estimated: 43 mL/min — ABNORMAL LOW (ref >60)
Glucose, Bld: 175 mg/dL — ABNORMAL HIGH (ref 70–99)
Potassium: 3.8 mmol/L (ref 3.5–5.1)
Sodium: 141 mmol/L (ref 135–145)
Total Bilirubin: 0.8 mg/dL (ref 0.3–1.2)
Total Protein: 5.9 g/dL — ABNORMAL LOW (ref 6.5–8.1)

## 2023-08-10 LAB — CBC
HCT: 34.5 % — ABNORMAL LOW (ref 36.0–46.0)
Hemoglobin: 11 g/dL — ABNORMAL LOW (ref 12.0–15.0)
MCH: 30.4 pg (ref 26.0–34.0)
MCHC: 31.9 g/dL (ref 30.0–36.0)
MCV: 95.3 fL (ref 80.0–100.0)
Platelets: 238 10*3/uL (ref 150–400)
RBC: 3.62 MIL/uL — ABNORMAL LOW (ref 3.87–5.11)
RDW: 14.5 % (ref 11.5–15.5)
WBC: 9 10*3/uL (ref 4.0–10.5)
nRBC: 0 % (ref 0.0–0.2)

## 2023-08-10 LAB — CK
Total CK: 26240 U/L — ABNORMAL HIGH (ref 38–234)
Total CK: 19727 U/L — ABNORMAL HIGH (ref 38–234)

## 2023-08-10 LAB — RESP PANEL BY RT-PCR (RSV, FLU A&B, COVID)  RVPGX2
Influenza A by PCR: NEGATIVE
Influenza B by PCR: NEGATIVE
Resp Syncytial Virus by PCR: NEGATIVE
SARS Coronavirus 2 by RT PCR: NEGATIVE

## 2023-08-10 LAB — CULTURE, BLOOD (ROUTINE X 2)
Special Requests: ADEQUATE
Special Requests: ADEQUATE

## 2023-08-10 LAB — I-STAT CG4 LACTIC ACID, ED
Lactic Acid, Venous: 3.2 mmol/L (ref 0.5–1.9)
Lactic Acid, Venous: 2 mmol/L (ref 0.5–1.9)

## 2023-08-10 LAB — GLUCOSE, CAPILLARY
Glucose-Capillary: 173 mg/dL — ABNORMAL HIGH (ref 70–99)
Glucose-Capillary: 189 mg/dL — ABNORMAL HIGH (ref 70–99)
Glucose-Capillary: 149 mg/dL — ABNORMAL HIGH (ref 70–99)

## 2023-08-10 LAB — CBG MONITORING, ED: Glucose-Capillary: 162 mg/dL — ABNORMAL HIGH (ref 70–99)

## 2023-08-10 MED ORDER — ACETAMINOPHEN 325 MG PO TABS
650.0000 mg | ORAL_TABLET | Freq: Four times a day (QID) | ORAL | Status: DC | PRN
  Administered 2023-08-13 – 2023-08-17 (×7): 650 mg via ORAL
  Filled 2023-08-10 (×9): qty 2

## 2023-08-10 MED ORDER — SODIUM CHLORIDE 0.9 % IV SOLN
500.0000 mg | INTRAVENOUS | Status: DC
  Administered 2023-08-10 – 2023-08-12 (×3): 500 mg via INTRAVENOUS
  Filled 2023-08-10 (×3): qty 5

## 2023-08-10 MED ORDER — LACTATED RINGERS IV SOLN: INTRAVENOUS | Status: AC

## 2023-08-10 MED ORDER — ENOXAPARIN SODIUM 40 MG/0.4ML IJ SOSY
40.0000 mg | PREFILLED_SYRINGE | INTRAMUSCULAR | Status: DC
  Administered 2023-08-10 – 2023-08-17 (×8): 40 mg via SUBCUTANEOUS
  Filled 2023-08-10 (×8): qty 0.4

## 2023-08-10 MED ORDER — ONDANSETRON HCL 4 MG PO TABS: 4.0000 mg | ORAL_TABLET | Freq: Four times a day (QID) | ORAL | Status: DC | PRN

## 2023-08-10 MED ORDER — ONDANSETRON HCL 4 MG/2ML IJ SOLN: 4.0000 mg | Freq: Four times a day (QID) | INTRAMUSCULAR | Status: DC | PRN

## 2023-08-10 MED ORDER — INSULIN ASPART 100 UNIT/ML IJ SOLN
0.0000 [IU] | Freq: Three times a day (TID) | INTRAMUSCULAR | Status: DC
  Administered 2023-08-10 – 2023-08-11 (×4): 2 [IU] via SUBCUTANEOUS
  Administered 2023-08-11 – 2023-08-12 (×2): 1 [IU] via SUBCUTANEOUS
  Administered 2023-08-12 (×2): 2 [IU] via SUBCUTANEOUS
  Administered 2023-08-13 (×3): 1 [IU] via SUBCUTANEOUS
  Administered 2023-08-14: 2 [IU] via SUBCUTANEOUS
  Administered 2023-08-14: 1 [IU] via SUBCUTANEOUS
  Administered 2023-08-14 – 2023-08-15 (×2): 2 [IU] via SUBCUTANEOUS
  Administered 2023-08-15 – 2023-08-16 (×4): 1 [IU] via SUBCUTANEOUS
  Administered 2023-08-16: 2 [IU] via SUBCUTANEOUS
  Administered 2023-08-17 – 2023-08-18 (×3): 1 [IU] via SUBCUTANEOUS

## 2023-08-10 MED ORDER — ACETAMINOPHEN 650 MG RE SUPP: 650.0000 mg | Freq: Four times a day (QID) | RECTAL | Status: DC | PRN

## 2023-08-10 NOTE — Assessment & Plan Note (Addendum)
Unclear why pt has lactic acidosis.  Also unclear why this initially increased in ED despite IVF followed by rapid decrease?  Dehydration / pre-renal? EDP thinks reperfusion of leg injury after removing stove crushing patient. Regardless: thankfully seems to be improving with IVF. See also PCCM note. Cont IVF as above Cont to trend lactates.

## 2023-08-10 NOTE — Assessment & Plan Note (Signed)
Transaminitis on labs, suspect these most likely secondary to rhabdomyolysis. Cholelithiasis without cholecystitis on CT, Alk phos nl.  Bili nl. Trend transaminases However, suspect these likely just secondary to rhabdomyolysis and dont represent actual hepatic dysfunction.

## 2023-08-10 NOTE — Assessment & Plan Note (Signed)
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 UA in particular concerning for pigmented nepropathy

## 2023-08-10 NOTE — Progress Notes (Signed)
PROGRESS NOTE    Elizabeth Schwartz  VOZ:[id number] DOB: 04-Feb-1946 DOA: 08/09/2023 PCP: Storm Frisk, MD    Brief Narrative:  77 year old female with history of dementia, hypertension, schizoaffective disorder, type 2 diabetes on metformin who lives alone at home, EMS was called by neighbor for welfare check as she was not seen around for the last 2 days.  EMS found her on the ground pinned down by a stove door on top of her.  Patient was confused.  In the emergency room mildly elevated troponins, CK level 20,000.   Assessment & Plan:   Patient seen and examined.  Admitted with acute rhabdomyolysis, left thigh injury from getting trapped with a stove door.  Currently hemodynamically stable.  Continue maintenance IV fluids.  Work with PT OT.  Will need to evaluate for safe disposition.  Will need to see whether patient is safe to be living alone or not.   DVT prophylaxis: enoxaparin (LOVENOX) injection 40 mg Start: 08/10/23 1400   Code Status: Full code Family Communication: None.  Niece was not able to pick up the phone. Time spent: 30 minutes.  Same-day admit.  No charge visit.    Dorcas Carrow, MD [facility]

## 2023-08-10 NOTE — Assessment & Plan Note (Addendum)
?   Source of rhabdo and lactate See photos above Compartments soft. Monitor for now, but no open wound. May want to get ortho opinion in AM, but with no surrounding pain and soft compartments, doubt that this crush injury will be emergently surgical tonight.

## 2023-08-10 NOTE — Assessment & Plan Note (Addendum)
Question of lingular PNA. Started on rocephin in ED Will add azithromycin Neg for COVID, FLU, RSV

## 2023-08-10 NOTE — ED Notes (Signed)
Pt still has on c-collar that she arrived to ED in. Informed PA laura and she informed me I could remove c-collar. Pt has been cleared.

## 2023-08-10 NOTE — ED Notes (Signed)
Phlebotomist at bedside to obtain troponin. She informed me she is unable to draw lab due to pt being a difficult stick. No attempt made. Phlebotomist informed me she didn't see anything to stick.

## 2023-08-10 NOTE — Consult Note (Signed)
Cardiology Consultation   Patient ID: Elizabeth Schwartz MRN: [id number]; DOB: June 11, 1946  Admit date: 08/09/2023 Date of Consult: 08/10/2023  PCP:  Storm Frisk, MD   [facility] Providers Cardiologist:  None        Patient Profile:   Elizabeth Schwartz is a 77 y.o. female with a hx of hypertension, hyperlipidemia, type 2 diabetes, schizophrenia, dementia who is being seen 08/10/2023 for the evaluation of elevated troponin at the request of Nestor Lewandowsky.  History of Present Illness:   [name] presented to the emergency department after a welfare check was requested by her neighbor when she had not been seen for 1 to 2 days.  Upon arrival of EMS she was found to be on the ground with a stone on top of her.  Workup in the emergency department was consistent with rhabdomyolysis secondary to crush injury.  Her workup also included a troponin which resulted at 360.  History limited by patient's cognitive impairment.  Patient reports no chest pain, dyspnea (was laying flat at the time of my interview), or history of heart disease.   Past Medical History:  Diagnosis Date   Anemia    COPD (chronic obstructive pulmonary disease) (HCC)    Dementia (HCC)    Depression    Diabetes mellitus    GERD (gastroesophageal reflux disease)    Hyperlipidemia    Hypertension    Osteoporosis    Schizophrenia, schizo-affective (HCC)    SOB (shortness of breath) on exertion    Syncope 06/11/2019    Past Surgical History:  Procedure Laterality Date   BREAST BIOPSY Right 2017   benign   LAPAROSCOPY  1975   ABD pain   VAGINAL HYSTERECTOMY  1975     Home Medications:  Prior to Admission medications   Medication Sig Start Date End Date Taking? Authorizing Provider  acetaminophen (TYLENOL) 500 MG tablet Take 500 mg by mouth 2 (two) times daily.    [provider]  albuterol (VENTOLIN HFA) 108 (90 Base) MCG/ACT inhaler INHALE TWO PUFFS BY MOUTH EVERY 6 HOURS AS NEEDED FOR  SHORTNESS OF BREATH 07/13/23   Storm Frisk, MD  alendronate (FOSAMAX) 70 MG tablet Take 1 tablet (70 mg total) by mouth once a week. 07/13/23   Storm Frisk, MD  Blood Glucose Monitoring Suppl [facility] VERIO) w/Device KIT Use to check blood sugar 07/13/23   Storm Frisk, MD  calcium carbonate (OS-CAL) 600 MG TABS Take 600 mg by mouth 2 (two) times daily with a meal.    [provider]  donepezil (ARICEPT) 10 MG tablet Take 1 tablet (10 mg total) by mouth at bedtime. 07/13/23   Storm Frisk, MD  ezetimibe (ZETIA) 10 MG tablet Take 1 tablet (10 mg total) by mouth daily. 07/13/23   Storm Frisk, MD  famotidine (PEPCID) 20 MG tablet Take 1 tablet (20 mg total) by mouth 2 (two) times daily. 07/13/23   Storm Frisk, MD  fenofibrate 160 MG tablet Take 1 tablet (160 mg total) by mouth daily. 07/13/23   Storm Frisk, MD  fish oil-omega-3 fatty acids 1000 MG capsule Take 1 g by mouth every morning.    [provider]  Iron, Ferrous Sulfate, 325 (65 Fe) MG TABS Take 325 mg by mouth daily with breakfast. 11/17/22   Storm Frisk, MD  metFORMIN (GLUCOPHAGE) 500 MG tablet Take 1 tablet (500 mg total) by mouth 2 (two) times daily with a meal. 07/13/23  Storm Frisk, MD  Multiple Vitamins-Minerals (CENTRUM SILVER ULTRA WOMENS PO) Take 1 tablet by mouth daily.    [provider]  OneTouch Delica Lancets 33G MISC Use to check blood sugar daily 07/13/23   Storm Frisk, MD  [facility] VERIO test strip Use as instructed 07/13/23   Storm Frisk, MD  SYMBICORT 160-4.5 MCG/ACT inhaler INHALE TWO PUFFS INTO THE LUNGS IN THE MORNING AND AT BEDTIME 07/13/23   Storm Frisk, MD  vitamin C (ASCORBIC ACID) 500 MG tablet Take 500 mg by mouth every morning.    [provider]  VITAMIN D, CHOLECALCIFEROL, PO Take 600 Units by mouth daily.    [provider]    Inpatient Medications: Scheduled Meds:  enoxaparin (LOVENOX) injection  40 mg  Subcutaneous Q24H   insulin aspart  0-9 Units Subcutaneous TID WC   Continuous Infusions:  cefTRIAXone (ROCEPHIN)  IV Stopped (08/09/23 2119)   lactated ringers 500 mL (08/09/23 2225)   PRN Meds: acetaminophen **OR** acetaminophen, ondansetron **OR** ondansetron (ZOFRAN) IV  Allergies:    Allergies  Allergen Reactions   Crestor [Rosuvastatin]     Muscle aches.     Social History:   Social History   Socioeconomic History   Marital status: Single    Spouse name: Not on file   Number of children: 0   Years of education: 12   Highest education level: Not on file  Occupational History   Not on file  Tobacco Use   Smoking status: Former    Current packs/day: 0.00    Types: Cigarettes    Start date: 02/27/1997    Quit date: 02/28/2012    Years since quitting: 11.4   Smokeless tobacco: Former    Types: Associate Professor status: Never Used  Substance and Sexual Activity   Alcohol use: Not Currently    Comment: "socially"   Drug use: No   Sexual activity: Not Currently  Other Topics Concern   Not on file  Social History Narrative   Right handed   Drinks caffeine   Retired   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)   Social Determinants of Health   Financial Resource Strain: Low Risk  (03/22/2023)   Overall Financial Resource Strain (CARDIA)    Difficulty of Paying Living Expenses: Not hard at all  Food Insecurity: No Food Insecurity (03/22/2023)   Hunger Vital Sign    Worried About Running Out of Food in the Last Year: Never true    Ran Out of Food in the Last Year: Never true  Transportation Needs: No Transportation Needs (03/22/2023)   PRAPARE - Administrator, Civil Service (Medical): No    Lack of Transportation (Non-Medical): No  Physical Activity: Insufficiently Active (03/22/2023)   Exercise Vital Sign    Days of Exercise per Week: 3 days    Minutes of Exercise per Session: 30 min  Stress: No Stress Concern Present  (03/22/2023)   [employer] of Occupational Health - Occupational Stress Questionnaire    Feeling of Stress : Not at all  Social Connections: Socially Isolated (03/22/2023)   Social Connection and Isolation Panel [NHANES]    Frequency of Communication with Friends and Family: Three times a week    Frequency of Social Gatherings with Friends and Family: Never    Attends Religious Services: Never    Database administrator or Organizations: No    Attends Banker Meetings: Never  Marital Status: Never married  Intimate Partner Violence: Not At Risk (03/22/2023)   Humiliation, Afraid, Rape, and Kick questionnaire    Fear of Current or Ex-Partner: No    Emotionally Abused: No    Physically Abused: No    Sexually Abused: No    Family History:    Family History  Problem Relation Age of Onset   Breast cancer Neg Hx      ROS:  Please see the history of present illness.   All other ROS reviewed and negative.     Physical Exam/Data:   Vitals:   08/10/23 0148 08/10/23 0152 08/10/23 0153 08/10/23 0154  BP:  (!) 125/112    Pulse:  (!) 116 (!) 120 (!) 118  Resp:  (!) 27 (!) 27 (!) 23  Temp: 97.6 F (36.4 C)     TempSrc: Oral     SpO2:  97% 96% 96%  Weight:      Height:        Intake/Output Summary (Last 24 hours) at 08/10/2023 0154 Last data filed at 08/09/2023 2321 Gross per 24 hour  Intake 2600 ml  Output 400 ml  Net 2200 ml      08/09/2023    7:22 PM 07/13/2023   11:32 AM 06/21/2023   12:59 PM  Last 3 Weights  Weight (lbs) 168 lb 6.9 oz 165 lb 3.2 oz 164 lb  Weight (kg) 76.4 kg 74.934 kg 74.39 kg     Body mass index is 28.03 kg/m.  General:  Elderly female laying in flat, C-collar in place HEENT: normal Neck: C-collar in place Vascular: Bilateral DP pulses intact Cardiac:  regular tachycardia with no murmurs Lungs:  clear to auscultation bilaterally, no wheezing, rhonchi or rales  Abd: soft, nontender, no hepatomegaly  Ext: trace bilateral edema, left  thigh soft tissue deformity with associated erythema Musculoskeletal: moves extremities spontaneously Skin: warm and dry  Neuro:  CNs 2-12 intact, no focal abnormalities noted, difficulty synthesizing sentences Psych:  Normal affect  EKG:  The EKG was personally reviewed and demonstrates:  sinus tachycardia, left axis deviation, poor r-wave progression Telemetry:  Telemetry was personally reviewed and demonstrates:  Sinus tachycardia  Relevant CV Studies: none  Laboratory Data:  High Sensitivity Troponin:   Recent Labs  Lab 08/10/23 0015  TROPONINIHS 360*     Chemistry Recent Labs  Lab 08/09/23 2042  NA 142  K 4.1  CL 106  CO2 18*  GLUCOSE 150*  BUN 43*  CREATININE 1.40*  CALCIUM 8.7*  GFRNONAA 39*  ANIONGAP 18*    Recent Labs  Lab 08/09/23 2042  PROT 7.5  ALBUMIN 3.8  AST 368*  ALT 115*  ALKPHOS 53  BILITOT 1.1   Lipids No results for input(s): "CHOL", "TRIG", "HDL", "LABVLDL", "LDLCALC", "CHOLHDL" in the last 168 hours.  Hematology Recent Labs  Lab 08/09/23 2042  WBC 14.4*  RBC 4.40  HGB 13.0  HCT 41.8  MCV 95.0  MCH 29.5  MCHC 31.1  RDW 14.5  PLT 283   Thyroid No results for input(s): "TSH", "FREET4" in the last 168 hours.  BNPNo results for input(s): "BNP", "PROBNP" in the last 168 hours.  DDimer No results for input(s): "DDIMER" in the last 168 hours.   Radiology/Studies:  CT CHEST ABDOMEN PELVIS WO CONTRAST  Result Date: 08/09/2023 CLINICAL DATA:  Trauma, fall. EXAM: CT CHEST, ABDOMEN AND PELVIS WITHOUT CONTRAST TECHNIQUE: Multidetector CT imaging of the chest, abdomen and pelvis was performed following the standard protocol without IV  contrast. RADIATION DOSE REDUCTION: This exam was performed according to the departmental dose-optimization program which includes automated exposure control, adjustment of the mA and/or kV according to patient size and/or use of iterative reconstruction technique. COMPARISON:  CT angiogram chest 11/17/2011.  FINDINGS: CT CHEST FINDINGS Cardiovascular: The heart is normal in size. The aorta is normal in size. There is calcified atherosclerotic disease throughout the aorta and coronary arteries. There is a small pericardial effusion. Mediastinum/Nodes: No enlarged mediastinal, hilar, or axillary lymph nodes. Thyroid gland, trachea, and esophagus demonstrate no significant findings. Small pericardial cyst is unchanged posterior to the left atrium measuring up to 13 mm. Lungs/Pleura: Mild emphysema is present. There is a small amount of airspace consolidation in the lingula. There are minimal peripheral ground-glass opacities scattered throughout the right lung. There is no pleural effusion or pneumothorax. Musculoskeletal: No acute fractures are seen. CT ABDOMEN PELVIS FINDINGS Hepatobiliary: Gallstones are present. There is no biliary ductal dilatation. No focal liver abnormality. Pancreas: Unremarkable. No pancreatic ductal dilatation or surrounding inflammatory changes. Spleen: Normal in size without focal abnormality. Adrenals/Urinary Tract: Low-density right adrenal nodule is compatible with adenoma measuring 13 mm, unchanged. The left adrenal gland is within normal limits. There is mild nonspecific bilateral perinephric fat stranding. There is no hydronephrosis or urinary tract calculus. The bladder is within normal limits. Stomach/Bowel: Stomach is within normal limits. Appendix appears normal. No evidence of bowel wall thickening, distention, or inflammatory changes. Vascular/Lymphatic: Aortic atherosclerosis. No enlarged abdominal or pelvic lymph nodes. Reproductive: Status post hysterectomy. No adnexal masses. Other: No abdominal wall hernia or abnormality. No abdominopelvic ascites. Musculoskeletal: No acute fractures are seen. IMPRESSION: 1. No acute posttraumatic sequelae in the chest, abdomen or pelvis. 2. Small amount of airspace consolidation in the lingula worrisome for infection. 3. Minimal peripheral  ground-glass opacities in the right lung may be infectious/inflammatory. 4. Small pericardial effusion. 5. Cholelithiasis. 6. Stable right adrenal adenoma. Aortic Atherosclerosis (ICD10-I70.0). Electronically Signed   By: Darliss Cheney M.D.   On: 08/09/2023 23:50   CT Head Wo Contrast  Result Date: 08/09/2023 CLINICAL DATA:  Polytrauma, blunt.  Fall. EXAM: CT HEAD WITHOUT CONTRAST CT CERVICAL SPINE WITHOUT CONTRAST TECHNIQUE: Multidetector CT imaging of the head and cervical spine was performed following the standard protocol without intravenous contrast. Multiplanar CT image reconstructions of the cervical spine were also generated. RADIATION DOSE REDUCTION: This exam was performed according to the departmental dose-optimization program which includes automated exposure control, adjustment of the mA and/or kV according to patient size and/or use of iterative reconstruction technique. COMPARISON:  09/02/2022, 04/02/2021. FINDINGS: CT HEAD FINDINGS Brain: No acute intracranial hemorrhage, midline shift or mass effect. No extra-axial fluid collection. Diffuse atrophy is noted. Subcortical and periventricular white matter hypodensities are present bilaterally. No hydrocephalus. Vascular: No hyperdense vessel or unexpected calcification. Skull: Normal. Negative for fracture or focal lesion. Sinuses/Orbits: Mucosal thickening is present in the ethmoid air cells. No acute orbital abnormality. Other: Subcutaneous fat stranding is present over the lateral aspect of the temporal bone on the right, possible contusion given history of trauma. CT CERVICAL SPINE FINDINGS Alignment: Normal. Skull base and vertebrae: No acute fracture. No primary bone lesion or focal pathologic process. Soft tissues and spinal canal: No prevertebral fluid or swelling. No visible canal hematoma. Disc levels: Multilevel intervertebral disc space narrowing, degenerative endplate changes, and facet arthropathy. Upper chest: No acute abnormality.  Other: Carotid artery calcifications. IMPRESSION: 1. No acute intracranial process. 2. Atrophy with chronic microvascular ischemic changes. 3. Multilevel degenerative changes  in the cervical spine without evidence of acute fracture. Electronically Signed   By: Thornell Sartorius M.D.   On: 08/09/2023 23:44   CT Cervical Spine Wo Contrast  Result Date: 08/09/2023 CLINICAL DATA:  Polytrauma, blunt.  Fall. EXAM: CT HEAD WITHOUT CONTRAST CT CERVICAL SPINE WITHOUT CONTRAST TECHNIQUE: Multidetector CT imaging of the head and cervical spine was performed following the standard protocol without intravenous contrast. Multiplanar CT image reconstructions of the cervical spine were also generated. RADIATION DOSE REDUCTION: This exam was performed according to the departmental dose-optimization program which includes automated exposure control, adjustment of the mA and/or kV according to patient size and/or use of iterative reconstruction technique. COMPARISON:  09/02/2022, 04/02/2021. FINDINGS: CT HEAD FINDINGS Brain: No acute intracranial hemorrhage, midline shift or mass effect. No extra-axial fluid collection. Diffuse atrophy is noted. Subcortical and periventricular white matter hypodensities are present bilaterally. No hydrocephalus. Vascular: No hyperdense vessel or unexpected calcification. Skull: Normal. Negative for fracture or focal lesion. Sinuses/Orbits: Mucosal thickening is present in the ethmoid air cells. No acute orbital abnormality. Other: Subcutaneous fat stranding is present over the lateral aspect of the temporal bone on the right, possible contusion given history of trauma. CT CERVICAL SPINE FINDINGS Alignment: Normal. Skull base and vertebrae: No acute fracture. No primary bone lesion or focal pathologic process. Soft tissues and spinal canal: No prevertebral fluid or swelling. No visible canal hematoma. Disc levels: Multilevel intervertebral disc space narrowing, degenerative endplate changes, and facet  arthropathy. Upper chest: No acute abnormality. Other: Carotid artery calcifications. IMPRESSION: 1. No acute intracranial process. 2. Atrophy with chronic microvascular ischemic changes. 3. Multilevel degenerative changes in the cervical spine without evidence of acute fracture. Electronically Signed   By: Thornell Sartorius M.D.   On: 08/09/2023 23:44   DG Femur Min 2 Views Left  Result Date: 08/09/2023 CLINICAL DATA:  Unwitnessed fall. EXAM: LEFT FEMUR 2 VIEWS COMPARISON:  None Available. FINDINGS: There is no evidence of fracture or other focal bone lesions. Soft tissues are unremarkable. IMPRESSION: Negative. Electronically Signed   By: Aram Candela M.D.   On: 08/09/2023 22:18   DG Pelvis 1-2 Views  Result Date: 08/09/2023 CLINICAL DATA:  Unwitnessed fall. EXAM: PELVIS - 1-2 VIEW COMPARISON:  None Available. FINDINGS: There is no evidence of pelvic fracture or diastasis. No pelvic bone lesions are seen. IMPRESSION: Negative. Electronically Signed   By: Aram Candela M.D.   On: 08/09/2023 22:18   DG Chest Port 1 View  Result Date: 08/09/2023 CLINICAL DATA:  Questionable sepsis EXAM: PORTABLE CHEST 1 VIEW COMPARISON:  Chest x-ray 03/25/2019 FINDINGS: The heart is enlarged. There is a new small left pleural effusion which may be partially loculated in the lower left hemithorax. The right lung is clear. No evidence for pneumothorax. No acute fractures are seen. IMPRESSION: 1. New small left pleural effusion which may be partially loculated in the lower left hemithorax. 2. Cardiomegaly. Electronically Signed   By: Darliss Cheney M.D.   On: 08/09/2023 21:14     Assessment and Plan:   Elevated troponin - Likely in the setting of demand due to rhabdomyolysis and being down for an unknown period of time. Recheck trending down. No active symptoms concerning for ACS. No symptoms concerning for CHF.  - Echocardiogram  - No indication for ACS therapies at this time   Risk Assessment/Risk Scores:        For questions or updates, please contact  [facility] Please consult [email or web address] for contact info under  Signed, Roderic Palau, MD  08/10/2023 1:54 AM

## 2023-08-10 NOTE — Assessment & Plan Note (Addendum)
Pt with rhabdo secondary to being down on ground and pinned by stove door with crush injury of LE (see photo) Compartments soft at this time. IVF: 2.5L bolus in ED followed by 150 cc/hr (ordered for 20h) Strict intake and output CPK daily

## 2023-08-10 NOTE — ED Notes (Signed)
IV team at bedside at this time.

## 2023-08-10 NOTE — Assessment & Plan Note (Signed)
Hold metformin Sensitive SSI AC

## 2023-08-10 NOTE — ED Notes (Signed)
ED TO INPATIENT HANDOFF REPORT  ED Nurse Name and Phone #: Dot Lanes, paramedic [phone]  S Name/Age/Gender Elizabeth Schwartz 77 y.o. female Room/Bed: 034C/034C  Code Status   Code Status: Full Code  Home/SNF/Other Home Patient oriented to: self Is this baseline? No   Triage Complete: Triage complete  Chief Complaint Rhabdomyolysis [M62.82]  Triage Note Pt arrived from home via EMS with c/o fall. Unknown date or time. Neighbors called for a welfare check because they haven't seen pt in one to two days. Pt has hx of dementia. Lives alone EMS found pt on the floor in the kitchen with stove on top of her. Soiled in urine. Wound present to left thigh GCS 14. 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   Allergies Allergies  Allergen Reactions   Crestor [Rosuvastatin]     Muscle aches.     Level of Care/Admitting Diagnosis ED Disposition     ED Disposition  Admit   Condition  --   Comment  Hospital Area: [facility] [[id number]]  Level of Care: Progressive [102]  Admit to Progressive based on following criteria: MULTISYSTEM THREATS such as stable sepsis, metabolic/electrolyte imbalance with or without encephalopathy that is responding to early treatment.  May place patient in observation at [facility] or [facility] if equivalent level of care is available:: No  Covid Evaluation: Asymptomatic - no recent exposure (last 10 days) testing not required  Diagnosis: Rhabdomyolysis [728.88.ICD-9-CM]  Admitting Physician: Hillary Bow [[id number]]  Attending Physician: Hillary Bow [id number]          B Medical/Surgery History Past Medical History:  Diagnosis Date   Anemia    COPD (chronic obstructive pulmonary disease) (HCC)    Dementia (HCC)    Depression    Diabetes mellitus    GERD (gastroesophageal reflux disease)    Hyperlipidemia    Hypertension    Osteoporosis    Schizophrenia, schizo-affective (HCC)    SOB (shortness of breath) on exertion    Syncope 06/11/2019   Past  Surgical History:  Procedure Laterality Date   BREAST BIOPSY Right 2017   benign   LAPAROSCOPY  1975   ABD pain   VAGINAL HYSTERECTOMY  1975     A IV Location/Drains/Wounds Patient Lines/Drains/Airways Status     Active Line/Drains/Airways     Name Placement date Placement time Site Days   Peripheral IV 08/09/23 22 G Left Hand 08/09/23  --  Hand  1   Peripheral IV 08/09/23 20 G Right Hand 08/09/23  2025  Hand  1   Peripheral IV 08/10/23 22 G 2.5" Anterior;Right Forearm 08/10/23  0103  Forearm  less than 1            Intake/Output Last 24 hours  Intake/Output Summary (Last 24 hours) at 08/10/2023 9147 Last data filed at 08/10/2023 0330 Gross per 24 hour  Intake 2850 ml  Output 400 ml  Net 2450 ml    Labs/Imaging Results for orders placed or performed during the hospital encounter of 08/09/23 (from the past 48 hour(s))  Resp panel by RT-PCR (RSV, Flu A&B, Covid) Urine, Catheterized     Status: None   Collection Time: 08/09/23  8:42 PM   Specimen: Urine, Catheterized; Nasal Swab  Result Value Ref Range   SARS Coronavirus 2 by RT PCR NEGATIVE NEGATIVE   Influenza A by PCR NEGATIVE NEGATIVE   Influenza B by PCR NEGATIVE NEGATIVE    Comment: (NOTE) The Xpert Xpress SARS-CoV-2/FLU/RSV plus assay is intended as an aid in  the diagnosis of influenza from Nasopharyngeal swab specimens and should not be used as a sole basis for treatment. Nasal washings and aspirates are unacceptable for Xpert Xpress SARS-CoV-2/FLU/RSV testing.  Fact Sheet for Patients: BloggerCourse.com  Fact Sheet for Healthcare Providers: SeriousBroker.it  This test is not yet approved or cleared by the [place] FDA and has been authorized for detection and/or diagnosis of SARS-CoV-2 by FDA under an Emergency Use Authorization (EUA). This EUA will remain in effect (meaning this test can be used) for the duration of the COVID-19 declaration under  Section 564(b)(1) of the Act, 21 U.S.C. section 360bbb-3(b)(1), unless the authorization is terminated or revoked.     Resp Syncytial Virus by PCR NEGATIVE NEGATIVE    Comment: (NOTE) Fact Sheet for Patients: BloggerCourse.com  Fact Sheet for Healthcare Providers: SeriousBroker.it  This test is not yet approved or cleared by the [place] FDA and has been authorized for detection and/or diagnosis of SARS-CoV-2 by FDA under an Emergency Use Authorization (EUA). This EUA will remain in effect (meaning this test can be used) for the duration of the COVID-19 declaration under Section 564(b)(1) of the Act, 21 U.S.C. section 360bbb-3(b)(1), unless the authorization is terminated or revoked.  Performed at [facility], [address], [place], [place] [address]   Comprehensive metabolic panel     Status: Abnormal   Collection Time: 08/09/23  8:42 PM  Result Value Ref Range   Sodium 142 135 - 145 mmol/L   Potassium 4.1 3.5 - 5.1 mmol/L   Chloride 106 98 - 111 mmol/L   CO2 18 (L) 22 - 32 mmol/L   Glucose, Bld 150 (H) 70 - 99 mg/dL    Comment: Glucose reference range applies only to samples taken after fasting for at least 8 hours.   BUN 43 (H) 8 - 23 mg/dL   Creatinine, Ser 8.65 (H) 0.44 - 1.00 mg/dL   Calcium 8.7 (L) 8.9 - 10.3 mg/dL   Total Protein 7.5 6.5 - 8.1 g/dL   Albumin 3.8 3.5 - 5.0 g/dL   AST 784 (H) 15 - 41 U/L   ALT 115 (H) 0 - 44 U/L   Alkaline Phosphatase 53 38 - 126 U/L   Total Bilirubin 1.1 0.3 - 1.2 mg/dL   GFR, Estimated 39 (L) >60 mL/min    Comment: (NOTE) Calculated using the CKD-EPI Creatinine Equation (2021)    Anion gap 18 (H) 5 - 15    Comment: Performed at [facility], [address], [place], [place] [address]  CBC with Differential     Status: Abnormal   Collection Time: 08/09/23  8:42 PM  Result Value Ref Range   WBC 14.4 (H) 4.0 - 10.5 K/uL   RBC 4.40 3.87 - 5.11 MIL/uL    Hemoglobin 13.0 12.0 - 15.0 g/dL   HCT 52.8 41.3 - 24.4 %   MCV 95.0 80.0 - 100.0 fL   MCH 29.5 26.0 - 34.0 pg   MCHC 31.1 30.0 - 36.0 g/dL   RDW 01.0 27.2 - 53.6 %   Platelets 283 150 - 400 K/uL   nRBC 0.0 0.0 - 0.2 %   Neutrophils Relative % 85 %   Neutro Abs 12.1 (H) 1.7 - 7.7 K/uL   Lymphocytes Relative 5 %   Lymphs Abs 0.8 0.7 - 4.0 K/uL   Monocytes Relative 9 %   Monocytes Absolute 1.3 (H) 0.1 - 1.0 K/uL   Eosinophils Relative 0 %   Eosinophils Absolute 0.0  0.0 - 0.5 K/uL   Basophils Relative 0 %   Basophils Absolute 0.0 0.0 - 0.1 K/uL   Immature Granulocytes 1 %   Abs Immature Granulocytes 0.12 (H) 0.00 - 0.07 K/uL    Comment: Performed at [facility], [address] [place], [place] [address]  Urinalysis, w/ Reflex to Culture (Infection Suspected) -Urine, Catheterized     Status: Abnormal   Collection Time: 08/09/23  8:42 PM  Result Value Ref Range   Specimen Source URINE, CATHETERIZED    Color, Urine YELLOW YELLOW   APPearance CLEAR CLEAR   Specific Gravity, Urine 1.018 1.005 - 1.030   pH 5.0 5.0 - 8.0   Glucose, UA NEGATIVE NEGATIVE mg/dL   Hgb urine dipstick LARGE (A) NEGATIVE   Bilirubin Urine NEGATIVE NEGATIVE   Ketones, ur 5 (A) NEGATIVE mg/dL   Protein, ur 30 (A) NEGATIVE mg/dL   Nitrite NEGATIVE NEGATIVE   Leukocytes,Ua NEGATIVE NEGATIVE   RBC / HPF 0-5 0 - 5 RBC/hpf   WBC, UA 0-5 0 - 5 WBC/hpf    Comment:        Reflex urine culture not performed if WBC <=10, OR if Squamous epithelial cells >5. If Squamous epithelial cells >5 suggest recollection.    Bacteria, UA NONE SEEN NONE SEEN   Squamous Epithelial / HPF 0-5 0 - 5 /HPF   Mucus PRESENT     Comment: Performed at [facility], [address], [place], [place] [address]  CK     Status: Abnormal   Collection Time: 08/09/23  8:42 PM  Result Value Ref Range   Total CK 19,249 (H) 38 - 234 U/L    Comment: RESULTS CONFIRMED BY MANUAL DILUTION Performed at [facility], [address], [place], [place] [address]   Protime-INR     Status: None   Collection Time: 08/09/23  8:42 PM  Result Value Ref Range   Prothrombin Time 14.6 11.4 - 15.2 seconds   INR 1.1 0.8 - 1.2    Comment: (NOTE) INR goal varies based on device and disease states. Performed at [name], [address] [place], [place] [address]   APTT     Status: Abnormal   Collection Time: 08/09/23  8:42 PM  Result Value Ref Range   aPTT 20 (L) 24 - 36 seconds    Comment: Performed at [facility], [address], [place], [place] [address]  I-Stat Lactic Acid, ED     Status: Abnormal   Collection Time: 08/09/23  8:54 PM  Result Value Ref Range   Lactic Acid, Venous 3.5 (HH) 0.5 - 1.9 mmol/L   Comment NOTIFIED PHYSICIAN   I-Stat Lactic Acid, ED     Status: Abnormal   Collection Time: 08/09/23 11:13 PM  Result Value Ref Range   Lactic Acid, Venous 5.2 (HH) 0.5 - 1.9 mmol/L   Comment NOTIFIED PHYSICIAN   Troponin I (High Sensitivity)     Status: Abnormal   Collection Time: 08/10/23 12:15 AM  Result Value Ref Range   Troponin I (High Sensitivity) 360 (HH) <18 ng/L    Comment: CRITICAL RESULT CALLED TO, READ BACK BY AND VERIFIED WITH Cruzita Lederer RN 08/10/23 @0115  BY J. WHITE (NOTE) Elevated high sensitivity troponin I (hsTnI) values and significant  changes across serial measurements may suggest ACS but many other  chronic and acute conditions are known to elevate hsTnI results.  Refer to the "Links" section for chest pain  algorithms and additional  guidance. Performed at [facility], [address] [place], [place] [address]   Troponin I (High Sensitivity)     Status: Abnormal   Collection Time: 08/10/23  1:04 AM  Result Value Ref Range   Troponin I (High Sensitivity) 319 (HH) <18 ng/L    Comment: CRITICAL VALUE NOTED. VALUE IS CONSISTENT WITH PREVIOUSLY REPORTED/CALLED VALUE (NOTE) Elevated high sensitivity troponin I (hsTnI) values and significant  changes across serial  measurements may suggest ACS but many other  chronic and acute conditions are known to elevate hsTnI results.  Refer to the "Links" section for chest pain algorithms and additional  guidance. Performed at [facility], [address], [place], [place] [address]   I-Stat Lactic Acid     Status: Abnormal   Collection Time: 08/10/23  1:22 AM  Result Value Ref Range   Lactic Acid, Venous 3.2 (HH) 0.5 - 1.9 mmol/L   Comment NOTIFIED PHYSICIAN   CBC     Status: Abnormal   Collection Time: 08/10/23  4:08 AM  Result Value Ref Range   WBC 9.0 4.0 - 10.5 K/uL   RBC 3.62 (L) 3.87 - 5.11 MIL/uL   Hemoglobin 11.0 (L) 12.0 - 15.0 g/dL   HCT 42.5 (L) 95.6 - 38.7 %   MCV 95.3 80.0 - 100.0 fL   MCH 30.4 26.0 - 34.0 pg   MCHC 31.9 30.0 - 36.0 g/dL   RDW 56.4 33.2 - 95.1 %   Platelets 238 150 - 400 K/uL   nRBC 0.0 0.0 - 0.2 %    Comment: Performed at [facility], [address], [place], [place] [address]  Comprehensive metabolic panel     Status: Abnormal   Collection Time: 08/10/23  4:08 AM  Result Value Ref Range   Sodium 141 135 - 145 mmol/L   Potassium 3.8 3.5 - 5.1 mmol/L   Chloride 107 98 - 111 mmol/L   CO2 24 22 - 32 mmol/L   Glucose, Bld 175 (H) 70 - 99 mg/dL    Comment: Glucose reference range applies only to samples taken after fasting for at least 8 hours.   BUN 37 (H) 8 - 23 mg/dL   Creatinine, Ser 6.06 (H) 0.44 - 1.00 mg/dL   Calcium 7.9 (L) 8.9 - 10.3 mg/dL   Total Protein 5.9 (L) 6.5 - 8.1 g/dL   Albumin 2.9 (L) 3.5 - 5.0 g/dL   AST 301 (H) 15 - 41 U/L   ALT 94 (H) 0 - 44 U/L   Alkaline Phosphatase 44 38 - 126 U/L   Total Bilirubin 0.8 0.3 - 1.2 mg/dL   GFR, Estimated 43 (L) >60 mL/min    Comment: (NOTE) Calculated using the CKD-EPI Creatinine Equation (2021)    Anion gap 10 5 - 15    Comment: Performed at [facility], [address], [place], [place] [address]  CK     Status: Abnormal   Collection Time: 08/10/23  4:08 AM  Result Value Ref Range    Total CK 26,240 (H) 38 - 234 U/L    Comment: RESULT CONFIRMED BY MANUAL DILUTION Performed at [facility], [address], [place], [place] [address]   I-Stat Lactic Acid     Status: Abnormal   Collection Time: 08/10/23  4:16 AM  Result Value Ref Range   Lactic Acid, Venous 2.0 (HH) 0.5 - 1.9 mmol/L  CBG monitoring, ED     Status:  Abnormal   Collection Time: 08/10/23  8:05 AM  Result Value Ref Range   Glucose-Capillary 162 (H) 70 - 99 mg/dL    Comment: Glucose reference range applies only to samples taken after fasting for at least 8 hours.   CT CHEST ABDOMEN PELVIS WO CONTRAST  Result Date: 08/09/2023 CLINICAL DATA:  Trauma, fall. EXAM: CT CHEST, ABDOMEN AND PELVIS WITHOUT CONTRAST TECHNIQUE: Multidetector CT imaging of the chest, abdomen and pelvis was performed following the standard protocol without IV contrast. RADIATION DOSE REDUCTION: This exam was performed according to the departmental dose-optimization program which includes automated exposure control, adjustment of the mA and/or kV according to patient size and/or use of iterative reconstruction technique. COMPARISON:  CT angiogram chest 11/17/2011. FINDINGS: CT CHEST FINDINGS Cardiovascular: The heart is normal in size. The aorta is normal in size. There is calcified atherosclerotic disease throughout the aorta and coronary arteries. There is a small pericardial effusion. Mediastinum/Nodes: No enlarged mediastinal, hilar, or axillary lymph nodes. Thyroid gland, trachea, and esophagus demonstrate no significant findings. Small pericardial cyst is unchanged posterior to the left atrium measuring up to 13 mm. Lungs/Pleura: Mild emphysema is present. There is a small amount of airspace consolidation in the lingula. There are minimal peripheral ground-glass opacities scattered throughout the right lung. There is no pleural effusion or pneumothorax. Musculoskeletal: No acute fractures are seen. CT ABDOMEN PELVIS FINDINGS Hepatobiliary:  Gallstones are present. There is no biliary ductal dilatation. No focal liver abnormality. Pancreas: Unremarkable. No pancreatic ductal dilatation or surrounding inflammatory changes. Spleen: Normal in size without focal abnormality. Adrenals/Urinary Tract: Low-density right adrenal nodule is compatible with adenoma measuring 13 mm, unchanged. The left adrenal gland is within normal limits. There is mild nonspecific bilateral perinephric fat stranding. There is no hydronephrosis or urinary tract calculus. The bladder is within normal limits. Stomach/Bowel: Stomach is within normal limits. Appendix appears normal. No evidence of bowel wall thickening, distention, or inflammatory changes. Vascular/Lymphatic: Aortic atherosclerosis. No enlarged abdominal or pelvic lymph nodes. Reproductive: Status post hysterectomy. No adnexal masses. Other: No abdominal wall hernia or abnormality. No abdominopelvic ascites. Musculoskeletal: No acute fractures are seen. IMPRESSION: 1. No acute posttraumatic sequelae in the chest, abdomen or pelvis. 2. Small amount of airspace consolidation in the lingula worrisome for infection. 3. Minimal peripheral ground-glass opacities in the right lung may be infectious/inflammatory. 4. Small pericardial effusion. 5. Cholelithiasis. 6. Stable right adrenal adenoma. Aortic Atherosclerosis (ICD10-I70.0). Electronically Signed   By: Darliss Cheney M.D.   On: 08/09/2023 23:50   CT Head Wo Contrast  Result Date: 08/09/2023 CLINICAL DATA:  Polytrauma, blunt.  Fall. EXAM: CT HEAD WITHOUT CONTRAST CT CERVICAL SPINE WITHOUT CONTRAST TECHNIQUE: Multidetector CT imaging of the head and cervical spine was performed following the standard protocol without intravenous contrast. Multiplanar CT image reconstructions of the cervical spine were also generated. RADIATION DOSE REDUCTION: This exam was performed according to the departmental dose-optimization program which includes automated exposure control,  adjustment of the mA and/or kV according to patient size and/or use of iterative reconstruction technique. COMPARISON:  09/02/2022, 04/02/2021. FINDINGS: CT HEAD FINDINGS Brain: No acute intracranial hemorrhage, midline shift or mass effect. No extra-axial fluid collection. Diffuse atrophy is noted. Subcortical and periventricular white matter hypodensities are present bilaterally. No hydrocephalus. Vascular: No hyperdense vessel or unexpected calcification. Skull: Normal. Negative for fracture or focal lesion. Sinuses/Orbits: Mucosal thickening is present in the ethmoid air cells. No acute orbital abnormality. Other: Subcutaneous fat stranding is present over the lateral aspect of the temporal  bone on the right, possible contusion given history of trauma. CT CERVICAL SPINE FINDINGS Alignment: Normal. Skull base and vertebrae: No acute fracture. No primary bone lesion or focal pathologic process. Soft tissues and spinal canal: No prevertebral fluid or swelling. No visible canal hematoma. Disc levels: Multilevel intervertebral disc space narrowing, degenerative endplate changes, and facet arthropathy. Upper chest: No acute abnormality. Other: Carotid artery calcifications. IMPRESSION: 1. No acute intracranial process. 2. Atrophy with chronic microvascular ischemic changes. 3. Multilevel degenerative changes in the cervical spine without evidence of acute fracture. Electronically Signed   By: Thornell Sartorius M.D.   On: 08/09/2023 23:44   CT Cervical Spine Wo Contrast  Result Date: 08/09/2023 CLINICAL DATA:  Polytrauma, blunt.  Fall. EXAM: CT HEAD WITHOUT CONTRAST CT CERVICAL SPINE WITHOUT CONTRAST TECHNIQUE: Multidetector CT imaging of the head and cervical spine was performed following the standard protocol without intravenous contrast. Multiplanar CT image reconstructions of the cervical spine were also generated. RADIATION DOSE REDUCTION: This exam was performed according to the departmental dose-optimization  program which includes automated exposure control, adjustment of the mA and/or kV according to patient size and/or use of iterative reconstruction technique. COMPARISON:  09/02/2022, 04/02/2021. FINDINGS: CT HEAD FINDINGS Brain: No acute intracranial hemorrhage, midline shift or mass effect. No extra-axial fluid collection. Diffuse atrophy is noted. Subcortical and periventricular white matter hypodensities are present bilaterally. No hydrocephalus. Vascular: No hyperdense vessel or unexpected calcification. Skull: Normal. Negative for fracture or focal lesion. Sinuses/Orbits: Mucosal thickening is present in the ethmoid air cells. No acute orbital abnormality. Other: Subcutaneous fat stranding is present over the lateral aspect of the temporal bone on the right, possible contusion given history of trauma. CT CERVICAL SPINE FINDINGS Alignment: Normal. Skull base and vertebrae: No acute fracture. No primary bone lesion or focal pathologic process. Soft tissues and spinal canal: No prevertebral fluid or swelling. No visible canal hematoma. Disc levels: Multilevel intervertebral disc space narrowing, degenerative endplate changes, and facet arthropathy. Upper chest: No acute abnormality. Other: Carotid artery calcifications. IMPRESSION: 1. No acute intracranial process. 2. Atrophy with chronic microvascular ischemic changes. 3. Multilevel degenerative changes in the cervical spine without evidence of acute fracture. Electronically Signed   By: Thornell Sartorius M.D.   On: 08/09/2023 23:44   DG Femur Min 2 Views Left  Result Date: 08/09/2023 CLINICAL DATA:  Unwitnessed fall. EXAM: LEFT FEMUR 2 VIEWS COMPARISON:  None Available. FINDINGS: There is no evidence of fracture or other focal bone lesions. Soft tissues are unremarkable. IMPRESSION: Negative. Electronically Signed   By: Aram Candela M.D.   On: 08/09/2023 22:18   DG Pelvis 1-2 Views  Result Date: 08/09/2023 CLINICAL DATA:  Unwitnessed fall. EXAM: PELVIS  - 1-2 VIEW COMPARISON:  None Available. FINDINGS: There is no evidence of pelvic fracture or diastasis. No pelvic bone lesions are seen. IMPRESSION: Negative. Electronically Signed   By: Aram Candela M.D.   On: 08/09/2023 22:18   DG Chest Port 1 View  Result Date: 08/09/2023 CLINICAL DATA:  Questionable sepsis EXAM: PORTABLE CHEST 1 VIEW COMPARISON:  Chest x-ray 03/25/2019 FINDINGS: The heart is enlarged. There is a new small left pleural effusion which may be partially loculated in the lower left hemithorax. The right lung is clear. No evidence for pneumothorax. No acute fractures are seen. IMPRESSION: 1. New small left pleural effusion which may be partially loculated in the lower left hemithorax. 2. Cardiomegaly. Electronically Signed   By: Darliss Cheney M.D.   On: 08/09/2023 21:14  Pending Labs Unresulted Labs (From admission, onward)     Start     Ordered   08/10/23 0500  CK  Daily,   R      08/10/23 0151   08/09/23 1948  Blood Culture (routine x 2)  (Septic presentation on arrival (screening labs, nursing and treatment orders for obvious sepsis))  BLOOD CULTURE X 2,   STAT      08/09/23 1949            Vitals/Pain Today's Vitals   08/10/23 0637 08/10/23 0638 08/10/23 0700 08/10/23 0735  BP:   (!) 111/59 (!) 111/59  Pulse: (!) 109 (!) 109 (!) 113 (!) 118  Resp: 19 (!) 21 (!) 25 15  Temp:    98.9 F (37.2 C)  TempSrc:    Oral  SpO2: 97% 100% 96% 96%  Weight:      Height:        Isolation Precautions No active isolations  Medications Medications  lactated ringers infusion (1,000 mLs Intravenous New Bag/Given 08/10/23 0412)  cefTRIAXone (ROCEPHIN) 2 g in sodium chloride 0.9 % 100 mL IVPB (0 g Intravenous Stopped 08/09/23 2119)  enoxaparin (LOVENOX) injection 40 mg (has no administration in time range)  acetaminophen (TYLENOL) tablet 650 mg (has no administration in time range)    Or  acetaminophen (TYLENOL) suppository 650 mg (has no administration in time range)   ondansetron (ZOFRAN) tablet 4 mg (has no administration in time range)    Or  ondansetron (ZOFRAN) injection 4 mg (has no administration in time range)  insulin aspart (novoLOG) injection 0-9 Units (2 Units Subcutaneous Given 08/10/23 0809)  azithromycin (ZITHROMAX) 500 mg in sodium chloride 0.9 % 250 mL IVPB (0 mg Intravenous Stopped 08/10/23 0330)  lactated ringers bolus 1,000 mL (0 mLs Intravenous Stopped 08/09/23 2208)    And  lactated ringers bolus 1,000 mL (0 mLs Intravenous Stopped 08/09/23 2254)    And  lactated ringers bolus 500 mL (0 mLs Intravenous Stopped 08/09/23 2321)  fentaNYL (SUBLIMAZE) injection 50 mcg (50 mcg Intravenous Given 08/09/23 2224)  sodium bicarbonate injection 50 mEq (50 mEq Intravenous Given 08/10/23 0017)    Mobility walks with device     Focused Assessments    R Recommendations: See Admitting Provider Note  Report given to:   Additional Notes:

## 2023-08-10 NOTE — H&P (Addendum)
History and Physical    Patient: Elizabeth Schwartz PIR:[id number] DOB: Jul 22, 1946 DOA: 08/09/2023 DOS: the patient was seen and examined on 08/10/2023 PCP: Storm Frisk, MD  Patient coming from: Home  Chief Complaint:  Chief Complaint  Patient presents with   Fall   HPI: Elizabeth Schwartz is a 77 y.o. female with medical history significant of dementia, HTN, schizo-affective schizophrenia, DM2 on metformin.  Pt lives alone.  EMS called after neighbor called for welfare check on patient after she hadnt been seen for past 1-2 days.  On arrival EMS found pt on ground pinned down by stove on top of her.  Unknown exactly how long pt down for.  Pt denies pain.  Reports chills and SOB.  History limited due to dementia.  Review of Systems: As mentioned in the history of present illness. All other systems reviewed and are negative. Past Medical History:  Diagnosis Date   Anemia    COPD (chronic obstructive pulmonary disease) (HCC)    Dementia (HCC)    Depression    Diabetes mellitus    GERD (gastroesophageal reflux disease)    Hyperlipidemia    Hypertension    Osteoporosis    Schizophrenia, schizo-affective (HCC)    SOB (shortness of breath) on exertion    Syncope 06/11/2019   Past Surgical History:  Procedure Laterality Date   BREAST BIOPSY Right 2017   benign   LAPAROSCOPY  1975   ABD pain   VAGINAL HYSTERECTOMY  1975   Social History:  reports that she quit smoking about 11 years ago. Her smoking use included cigarettes. She started smoking about 26 years ago. She has quit using smokeless tobacco.  Her smokeless tobacco use included chew. She reports that she does not currently use alcohol. She reports that she does not use drugs.  Allergies  Allergen Reactions   Crestor [Rosuvastatin]     Muscle aches.     Family History  Problem Relation Age of Onset   Breast cancer Neg Hx     Prior to Admission medications   Medication Sig Start Date End Date Taking? Authorizing  Provider  acetaminophen (TYLENOL) 500 MG tablet Take 500 mg by mouth 2 (two) times daily.    [provider]  albuterol (VENTOLIN HFA) 108 (90 Base) MCG/ACT inhaler INHALE TWO PUFFS BY MOUTH EVERY 6 HOURS AS NEEDED FOR SHORTNESS OF BREATH 07/13/23   Storm Frisk, MD  alendronate (FOSAMAX) 70 MG tablet Take 1 tablet (70 mg total) by mouth once a week. 07/13/23   Storm Frisk, MD  Blood Glucose Monitoring Suppl [facility] VERIO) w/Device KIT Use to check blood sugar 07/13/23   Storm Frisk, MD  calcium carbonate (OS-CAL) 600 MG TABS Take 600 mg by mouth 2 (two) times daily with a meal.    [provider]  donepezil (ARICEPT) 10 MG tablet Take 1 tablet (10 mg total) by mouth at bedtime. 07/13/23   Storm Frisk, MD  ezetimibe (ZETIA) 10 MG tablet Take 1 tablet (10 mg total) by mouth daily. 07/13/23   Storm Frisk, MD  famotidine (PEPCID) 20 MG tablet Take 1 tablet (20 mg total) by mouth 2 (two) times daily. 07/13/23   Storm Frisk, MD  fenofibrate 160 MG tablet Take 1 tablet (160 mg total) by mouth daily. 07/13/23   Storm Frisk, MD  fish oil-omega-3 fatty acids 1000 MG capsule Take 1 g by mouth every morning.    [provider]  Iron, Ferrous Sulfate,  325 (65 Fe) MG TABS Take 325 mg by mouth daily with breakfast. 11/17/22   Storm Frisk, MD  metFORMIN (GLUCOPHAGE) 500 MG tablet Take 1 tablet (500 mg total) by mouth 2 (two) times daily with a meal. 07/13/23   Storm Frisk, MD  Multiple Vitamins-Minerals (CENTRUM SILVER ULTRA WOMENS PO) Take 1 tablet by mouth daily.    [provider]  OneTouch Delica Lancets 33G MISC Use to check blood sugar daily 07/13/23   Storm Frisk, MD  [facility] VERIO test strip Use as instructed 07/13/23   Storm Frisk, MD  SYMBICORT 160-4.5 MCG/ACT inhaler INHALE TWO PUFFS INTO THE LUNGS IN THE MORNING AND AT BEDTIME 07/13/23   Storm Frisk, MD  vitamin C (ASCORBIC ACID) 500 MG tablet Take 500 mg  by mouth every morning.    [provider]  VITAMIN D, CHOLECALCIFEROL, PO Take 600 Units by mouth daily.    [provider]    Physical Exam: Vitals:   08/10/23 0148 08/10/23 0152 08/10/23 0153 08/10/23 0154  BP:  (!) 125/112    Pulse:  (!) 116 (!) 120 (!) 118  Resp:  (!) 27 (!) 27 (!) 23  Temp: 97.6 F (36.4 C)     TempSrc: Oral     SpO2:  97% 96% 96%  Weight:      Height:       Constitutional: NAD, calm, comfortable Respiratory: clear to auscultation bilaterally, no wheezing, no crackles. Normal respiratory effort. No accessory muscle use.  Cardiovascular: Tachycardia present Abdomen: no tenderness, no masses palpated. No hepatosplenomegaly. Bowel sounds positive.  Skin: Pressure DTI to LLE, see below.  Compartments around area of DTI are soft.  Pt denies pain around area of DTI to palpation but can feel me touching she reports.  Not really able to move toes on LLE when I ask though RNs report she has been moving them some.  Does have in-tact / doplerable DP pulse in LLE foot (area marked with 'X') No skin discoloration to foot.   Has loss of sensation over B feet (? Diabetic neuropathy given she also has what looks like the beginnings of a pre ulcerative callus to R foot.)    Neurologic: Loss of sensation over B feet. Psychiatric: Normal judgment and insight. Alert and oriented x 3. Normal mood.   Data Reviewed:    Labs on Admission: I have personally reviewed following labs and imaging studies  CBC: Recent Labs  Lab 08/09/23 2042  WBC 14.4*  NEUTROABS 12.1*  HGB 13.0  HCT 41.8  MCV 95.0  PLT 283   Basic Metabolic Panel: Recent Labs  Lab 08/09/23 2042  NA 142  K 4.1  CL 106  CO2 18*  GLUCOSE 150*  BUN 43*  CREATININE 1.40*  CALCIUM 8.7*   GFR: Estimated Creatinine Clearance: 34.4 mL/min (A) (by C-G formula based on SCr of 1.4 mg/dL (H)). Liver Function Tests: Recent Labs  Lab 08/09/23 2042  AST 368*  ALT 115*  ALKPHOS 53  BILITOT  1.1  PROT 7.5  ALBUMIN 3.8   No results for input(s): "LIPASE", "AMYLASE" in the last 168 hours. No results for input(s): "AMMONIA" in the last 168 hours. Coagulation Profile: Recent Labs  Lab 08/09/23 2042  INR 1.1   Cardiac Enzymes: Recent Labs  Lab 08/09/23 2042  CKTOTAL 19,249*   BNP (last 3 results) No results for input(s): "PROBNP" in the last 8760 hours. HbA1C: No results for input(s): "HGBA1C" in the last  72 hours. CBG: No results for input(s): "GLUCAP" in the last 168 hours. Lipid Profile: No results for input(s): "CHOL", "HDL", "LDLCALC", "TRIG", "CHOLHDL", "LDLDIRECT" in the last 72 hours. Thyroid Function Tests: No results for input(s): "TSH", "T4TOTAL", "FREET4", "T3FREE", "THYROIDAB" in the last 72 hours. Anemia Panel: No results for input(s): "VITAMINB12", "FOLATE", "FERRITIN", "TIBC", "IRON", "RETICCTPCT" in the last 72 hours. Urine analysis:    Component Value Date/Time   COLORURINE YELLOW 08/09/2023 2042   APPEARANCEUR CLEAR 08/09/2023 2042   LABSPEC 1.018 08/09/2023 2042   PHURINE 5.0 08/09/2023 2042   GLUCOSEU NEGATIVE 08/09/2023 2042   HGBUR LARGE (A) 08/09/2023 2042   BILIRUBINUR NEGATIVE 08/09/2023 2042   KETONESUR 5 (A) 08/09/2023 2042   PROTEINUR 30 (A) 08/09/2023 2042   UROBILINOGEN 1.0 09/03/2009 1956   NITRITE NEGATIVE 08/09/2023 2042   LEUKOCYTESUR NEGATIVE 08/09/2023 2042    Radiological Exams on Admission: CT CHEST ABDOMEN PELVIS WO CONTRAST  Result Date: 08/09/2023 CLINICAL DATA:  Trauma, fall. EXAM: CT CHEST, ABDOMEN AND PELVIS WITHOUT CONTRAST TECHNIQUE: Multidetector CT imaging of the chest, abdomen and pelvis was performed following the standard protocol without IV contrast. RADIATION DOSE REDUCTION: This exam was performed according to the departmental dose-optimization program which includes automated exposure control, adjustment of the mA and/or kV according to patient size and/or use of iterative reconstruction technique.  COMPARISON:  CT angiogram chest 11/17/2011. FINDINGS: CT CHEST FINDINGS Cardiovascular: The heart is normal in size. The aorta is normal in size. There is calcified atherosclerotic disease throughout the aorta and coronary arteries. There is a small pericardial effusion. Mediastinum/Nodes: No enlarged mediastinal, hilar, or axillary lymph nodes. Thyroid gland, trachea, and esophagus demonstrate no significant findings. Small pericardial cyst is unchanged posterior to the left atrium measuring up to 13 mm. Lungs/Pleura: Mild emphysema is present. There is a small amount of airspace consolidation in the lingula. There are minimal peripheral ground-glass opacities scattered throughout the right lung. There is no pleural effusion or pneumothorax. Musculoskeletal: No acute fractures are seen. CT ABDOMEN PELVIS FINDINGS Hepatobiliary: Gallstones are present. There is no biliary ductal dilatation. No focal liver abnormality. Pancreas: Unremarkable. No pancreatic ductal dilatation or surrounding inflammatory changes. Spleen: Normal in size without focal abnormality. Adrenals/Urinary Tract: Low-density right adrenal nodule is compatible with adenoma measuring 13 mm, unchanged. The left adrenal gland is within normal limits. There is mild nonspecific bilateral perinephric fat stranding. There is no hydronephrosis or urinary tract calculus. The bladder is within normal limits. Stomach/Bowel: Stomach is within normal limits. Appendix appears normal. No evidence of bowel wall thickening, distention, or inflammatory changes. Vascular/Lymphatic: Aortic atherosclerosis. No enlarged abdominal or pelvic lymph nodes. Reproductive: Status post hysterectomy. No adnexal masses. Other: No abdominal wall hernia or abnormality. No abdominopelvic ascites. Musculoskeletal: No acute fractures are seen. IMPRESSION: 1. No acute posttraumatic sequelae in the chest, abdomen or pelvis. 2. Small amount of airspace consolidation in the lingula  worrisome for infection. 3. Minimal peripheral ground-glass opacities in the right lung may be infectious/inflammatory. 4. Small pericardial effusion. 5. Cholelithiasis. 6. Stable right adrenal adenoma. Aortic Atherosclerosis (ICD10-I70.0). Electronically Signed   By: Darliss Cheney M.D.   On: 08/09/2023 23:50   CT Head Wo Contrast  Result Date: 08/09/2023 CLINICAL DATA:  Polytrauma, blunt.  Fall. EXAM: CT HEAD WITHOUT CONTRAST CT CERVICAL SPINE WITHOUT CONTRAST TECHNIQUE: Multidetector CT imaging of the head and cervical spine was performed following the standard protocol without intravenous contrast. Multiplanar CT image reconstructions of the cervical spine were also generated. RADIATION DOSE REDUCTION:  This exam was performed according to the departmental dose-optimization program which includes automated exposure control, adjustment of the mA and/or kV according to patient size and/or use of iterative reconstruction technique. COMPARISON:  09/02/2022, 04/02/2021. FINDINGS: CT HEAD FINDINGS Brain: No acute intracranial hemorrhage, midline shift or mass effect. No extra-axial fluid collection. Diffuse atrophy is noted. Subcortical and periventricular white matter hypodensities are present bilaterally. No hydrocephalus. Vascular: No hyperdense vessel or unexpected calcification. Skull: Normal. Negative for fracture or focal lesion. Sinuses/Orbits: Mucosal thickening is present in the ethmoid air cells. No acute orbital abnormality. Other: Subcutaneous fat stranding is present over the lateral aspect of the temporal bone on the right, possible contusion given history of trauma. CT CERVICAL SPINE FINDINGS Alignment: Normal. Skull base and vertebrae: No acute fracture. No primary bone lesion or focal pathologic process. Soft tissues and spinal canal: No prevertebral fluid or swelling. No visible canal hematoma. Disc levels: Multilevel intervertebral disc space narrowing, degenerative endplate changes, and facet  arthropathy. Upper chest: No acute abnormality. Other: Carotid artery calcifications. IMPRESSION: 1. No acute intracranial process. 2. Atrophy with chronic microvascular ischemic changes. 3. Multilevel degenerative changes in the cervical spine without evidence of acute fracture. Electronically Signed   By: Thornell Sartorius M.D.   On: 08/09/2023 23:44   CT Cervical Spine Wo Contrast  Result Date: 08/09/2023 CLINICAL DATA:  Polytrauma, blunt.  Fall. EXAM: CT HEAD WITHOUT CONTRAST CT CERVICAL SPINE WITHOUT CONTRAST TECHNIQUE: Multidetector CT imaging of the head and cervical spine was performed following the standard protocol without intravenous contrast. Multiplanar CT image reconstructions of the cervical spine were also generated. RADIATION DOSE REDUCTION: This exam was performed according to the departmental dose-optimization program which includes automated exposure control, adjustment of the mA and/or kV according to patient size and/or use of iterative reconstruction technique. COMPARISON:  09/02/2022, 04/02/2021. FINDINGS: CT HEAD FINDINGS Brain: No acute intracranial hemorrhage, midline shift or mass effect. No extra-axial fluid collection. Diffuse atrophy is noted. Subcortical and periventricular white matter hypodensities are present bilaterally. No hydrocephalus. Vascular: No hyperdense vessel or unexpected calcification. Skull: Normal. Negative for fracture or focal lesion. Sinuses/Orbits: Mucosal thickening is present in the ethmoid air cells. No acute orbital abnormality. Other: Subcutaneous fat stranding is present over the lateral aspect of the temporal bone on the right, possible contusion given history of trauma. CT CERVICAL SPINE FINDINGS Alignment: Normal. Skull base and vertebrae: No acute fracture. No primary bone lesion or focal pathologic process. Soft tissues and spinal canal: No prevertebral fluid or swelling. No visible canal hematoma. Disc levels: Multilevel intervertebral disc space  narrowing, degenerative endplate changes, and facet arthropathy. Upper chest: No acute abnormality. Other: Carotid artery calcifications. IMPRESSION: 1. No acute intracranial process. 2. Atrophy with chronic microvascular ischemic changes. 3. Multilevel degenerative changes in the cervical spine without evidence of acute fracture. Electronically Signed   By: Thornell Sartorius M.D.   On: 08/09/2023 23:44   DG Femur Min 2 Views Left  Result Date: 08/09/2023 CLINICAL DATA:  Unwitnessed fall. EXAM: LEFT FEMUR 2 VIEWS COMPARISON:  None Available. FINDINGS: There is no evidence of fracture or other focal bone lesions. Soft tissues are unremarkable. IMPRESSION: Negative. Electronically Signed   By: Aram Candela M.D.   On: 08/09/2023 22:18   DG Pelvis 1-2 Views  Result Date: 08/09/2023 CLINICAL DATA:  Unwitnessed fall. EXAM: PELVIS - 1-2 VIEW COMPARISON:  None Available. FINDINGS: There is no evidence of pelvic fracture or diastasis. No pelvic bone lesions are seen. IMPRESSION: Negative. Electronically Signed  By: Aram Candela M.D.   On: 08/09/2023 22:18   DG Chest Port 1 View  Result Date: 08/09/2023 CLINICAL DATA:  Questionable sepsis EXAM: PORTABLE CHEST 1 VIEW COMPARISON:  Chest x-ray 03/25/2019 FINDINGS: The heart is enlarged. There is a new small left pleural effusion which may be partially loculated in the lower left hemithorax. The right lung is clear. No evidence for pneumothorax. No acute fractures are seen. IMPRESSION: 1. New small left pleural effusion which may be partially loculated in the lower left hemithorax. 2. Cardiomegaly. Electronically Signed   By: Darliss Cheney M.D.   On: 08/09/2023 21:14    EKG: Independently reviewed.   Assessment and Plan: * Rhabdomyolysis Pt with rhabdo secondary to being down on ground and pinned by stove door with crush injury of LE (see photo) Compartments soft at this time. IVF: 2.5L bolus in ED followed by 150 cc/hr (ordered for 20h) Strict intake  and output CPK daily  Pressure injury of skin with suspected deep tissue injury ? Source of rhabdo and lactate See photos above Compartments soft. Monitor for now, but no open wound. May want to get ortho opinion in AM, but with no surrounding pain and soft compartments, doubt that this crush injury will be emergently surgical tonight.   AKI (acute kidney injury) (HCC) Pre-renal vs early pigment nephropathy in setting of rhabdo. UA in particular concerning for pigmented nepropathy   Elevated troponin Cards less impressed with trop elevation, thinks this also likely secondary to rhabdo.  See their note.  Lactic acidosis Unclear why pt has lactic acidosis.  Also unclear why this initially increased in ED despite IVF followed by rapid decrease?  Dehydration / pre-renal? EDP thinks reperfusion of leg injury after removing stove crushing patient. Regardless: thankfully seems to be improving with IVF. See also PCCM note. Cont IVF as above Cont to trend lactates.  CAP (community acquired pneumonia) Question of lingular PNA. Started on rocephin in ED Will add azithromycin Neg for COVID, FLU, RSV  Transaminitis Transaminitis on labs, suspect these most likely secondary to rhabdomyolysis. Cholelithiasis without cholecystitis on CT, Alk phos nl.  Bili nl. Trend transaminases However, suspect these likely just secondary to rhabdomyolysis and dont represent actual hepatic dysfunction.  Diabetes type 2, controlled (HCC) Hold metformin Sensitive SSI AC  Major neurocognitive disorder due to multiple etiologies with behavioral disturbance (HCC) Pt with h/o Alzheimer's dementia. Also ? Schizophrenia? (On problem list, though I dont see that she's on any antipsychotics on prior home meds.) Cont Aricept      Advance Care Planning:   Code Status: Full Code Default  Consults: Cards, PCCM  Family Communication: No family in room  Severity of Illness: The appropriate patient status  for this patient is OBSERVATION. Observation status is judged to be reasonable and necessary in order to provide the required intensity of service to ensure the patient's safety. The patient's presenting symptoms, physical exam findings, and initial radiographic and laboratory data in the context of their medical condition is felt to place them at decreased risk for further clinical deterioration. Furthermore, it is anticipated that the patient will be medically stable for discharge from the hospital within 2 midnights of admission.   Author: Hillary Bow., DO 08/10/2023 2:30 AM  For on call review [email or web address].

## 2023-08-10 NOTE — Progress Notes (Signed)
Admission Note:  Pt arrived from ED to [facility] at approximately 0900 am. Pt is alert and oriented x1 to self, on room air, and denies pain, but has decreased sensation in left lower leg. Pt ambulates with a walker at baseline. Pt passed bedside swallow evaluation. Pt in NAD at this time, and fall precautions are in place.

## 2023-08-10 NOTE — Progress Notes (Signed)
Pt has not voided since admission to [facility] today. Bladder scan at 1855 yielded 347 mL. Attending physician Dorcas Carrow, MD made aware.

## 2023-08-10 NOTE — Assessment & Plan Note (Addendum)
Pt with h/o Alzheimer's dementia. Also ? Schizophrenia? (On problem list, though I dont see that she's on any antipsychotics on prior home meds.) Cont Aricept

## 2023-08-10 NOTE — ED Notes (Signed)
Missed attempt x1, 20 gauge to left AC. Bleeding controlled. Pt tolerated well.

## 2023-08-10 NOTE — Assessment & Plan Note (Addendum)
Cards less impressed with trop elevation, thinks this also likely secondary to rhabdo.  See their note.

## 2023-08-10 NOTE — Progress Notes (Addendum)
   Patient Name: Elizabeth Schwartz Date of Encounter: 08/10/2023 [facility] Cardiologist: None   Interval Summary  .    Patient pleasant but confused on exam. She has no recollection of the circumstances of this admission. Denies cardiovascular symptoms this morning including chest pain, shortness of breath, lightheadedness.  Vital Signs .    Vitals:   08/10/23 0637 08/10/23 0638 08/10/23 0700 08/10/23 0735  BP:   (!) 111/59 (!) 111/59  Pulse: (!) 109 (!) 109 (!) 113 (!) 118  Resp: 19 (!) 21 (!) 25 15  Temp:    98.9 F (37.2 C)  TempSrc:    Oral  SpO2: 97% 100% 96% 96%  Weight:      Height:        Intake/Output Summary (Last 24 hours) at 08/10/2023 0945 Last data filed at 08/10/2023 0330 Gross per 24 hour  Intake 2850 ml  Output 400 ml  Net 2450 ml      08/09/2023    7:22 PM 07/13/2023   11:32 AM 06/21/2023   12:59 PM  Last 3 Weights  Weight (lbs) 168 lb 6.9 oz 165 lb 3.2 oz 164 lb  Weight (kg) 76.4 kg 74.934 kg 74.39 kg      Telemetry/ECG    Sinus tachycardia - Personally Reviewed  Physical Exam .   GEN: No acute distress.  Confused Neck: No JVD Cardiac: regular and rapid rate. no murmurs, rubs, or gallops.  Respiratory: Clear to auscultation bilaterally. GI: Soft, nontender, non-distended  MS: No edema  Assessment & Plan .     Elevated troponin   Patient admitted for management of rhabdomyolysis after being found at home pinned under her stove door. Unknown time spent stuck in this position. As a part of her workup, labs checked including troponin which was found elevated at 360, trended down to 319. CK notably elevated at 26,240.  Initial ECG with baseline artifact but no evidence of acute ischemic changes. Will repeat this morning. Elevated and downtrending troponin along with significantly elevated CK consistent with demand event/diffuse inflammation in the setting of rhabdomyolysis. This is supported by transaminitis and AKI.  Echocardiogram ordered,  cardiology will follow results.  For questions or updates, please contact [facility] Please consult [email or web address] for contact info under        Signed, Perlie Gold, PA-C   I have seen and examined the patient along with Perlie Gold, PA-C .  I have reviewed the chart, notes and new data.  I agree with PA/NP's note.  Key new complaints: interactive and very pleasant and cooperative, but not fully oriented. Sometimes forgets what she intended to say before end of sentence. Trouble finding words. Denies any specific complaints.  Key examination changes: Normal CV exam. 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  PLAN: Troponin elevation is minimal and adynamic, not c/w acute coronary sd, ECG is non-ischemic. Awaiting echo, but no evidence that we will find significant cardiac pathology based on exam.  Thurmon Fair, MD, [facility] [phone] 08/10/2023, 10:45 AM

## 2023-08-11 ENCOUNTER — Inpatient Hospital Stay (HOSPITAL_COMMUNITY): Payer: Medicare Other

## 2023-08-11 DIAGNOSIS — M6282 Rhabdomyolysis: Secondary | ICD-10-CM

## 2023-08-11 DIAGNOSIS — G309 Alzheimer's disease, unspecified: Secondary | ICD-10-CM | POA: Diagnosis not present

## 2023-08-11 DIAGNOSIS — R7989 Other specified abnormal findings of blood chemistry: Secondary | ICD-10-CM | POA: Diagnosis not present

## 2023-08-11 DIAGNOSIS — S7712XA Crushing injury of left thigh, initial encounter: Secondary | ICD-10-CM | POA: Diagnosis not present

## 2023-08-11 DIAGNOSIS — R55 Syncope and collapse: Secondary | ICD-10-CM | POA: Diagnosis not present

## 2023-08-11 LAB — CBC WITH DIFFERENTIAL/PLATELET
Abs Immature Granulocytes: 0.12 10*3/uL — ABNORMAL HIGH (ref 0.00–0.07)
Basophils Absolute: 0 10*3/uL (ref 0.0–0.1)
Basophils Relative: 1 %
Eosinophils Absolute: 0.2 10*3/uL (ref 0.0–0.5)
Eosinophils Relative: 2 %
HCT: 29.1 % — ABNORMAL LOW (ref 36.0–46.0)
Hemoglobin: 9.2 g/dL — ABNORMAL LOW (ref 12.0–15.0)
Immature Granulocytes: 1 %
Lymphocytes Relative: 19 %
Lymphs Abs: 1.6 10*3/uL (ref 0.7–4.0)
MCH: 30.4 pg (ref 26.0–34.0)
MCHC: 31.6 g/dL (ref 30.0–36.0)
MCV: 96 fL (ref 80.0–100.0)
Monocytes Absolute: 0.7 10*3/uL (ref 0.1–1.0)
Monocytes Relative: 9 %
Neutro Abs: 5.7 10*3/uL (ref 1.7–7.7)
Neutrophils Relative %: 68 %
Platelets: 223 10*3/uL (ref 150–400)
RBC: 3.03 MIL/uL — ABNORMAL LOW (ref 3.87–5.11)
RDW: 14.5 % (ref 11.5–15.5)
WBC: 8.3 10*3/uL (ref 4.0–10.5)
nRBC: 0 % (ref 0.0–0.2)

## 2023-08-11 LAB — GLUCOSE, CAPILLARY
Glucose-Capillary: 150 mg/dL — ABNORMAL HIGH (ref 70–99)
Glucose-Capillary: 198 mg/dL — ABNORMAL HIGH (ref 70–99)
Glucose-Capillary: 129 mg/dL — ABNORMAL HIGH (ref 70–99)
Glucose-Capillary: 134 mg/dL — ABNORMAL HIGH (ref 70–99)

## 2023-08-11 LAB — COMPREHENSIVE METABOLIC PANEL
ALT: 77 U/L — ABNORMAL HIGH (ref 0–44)
AST: 195 U/L — ABNORMAL HIGH (ref 15–41)
Albumin: 2.3 g/dL — ABNORMAL LOW (ref 3.5–5.0)
Alkaline Phosphatase: 49 U/L (ref 38–126)
Anion gap: 10 (ref 5–15)
BUN: 35 mg/dL — ABNORMAL HIGH (ref 8–23)
CO2: 24 mmol/L (ref 22–32)
Calcium: 7.4 mg/dL — ABNORMAL LOW (ref 8.9–10.3)
Chloride: 107 mmol/L (ref 98–111)
Creatinine, Ser: 1.05 mg/dL — ABNORMAL HIGH (ref 0.44–1.00)
GFR, Estimated: 55 mL/min — ABNORMAL LOW (ref >60)
Glucose, Bld: 126 mg/dL — ABNORMAL HIGH (ref 70–99)
Potassium: 3.5 mmol/L (ref 3.5–5.1)
Sodium: 141 mmol/L (ref 135–145)
Total Bilirubin: 0.3 mg/dL (ref 0.3–1.2)
Total Protein: 5.3 g/dL — ABNORMAL LOW (ref 6.5–8.1)

## 2023-08-11 LAB — ECHOCARDIOGRAM COMPLETE
Calc EF: 56.2 %
Height: 65 in
S' Lateral: 2.7 cm
Single Plane A2C EF: 56.5 %
Single Plane A4C EF: 56 %
Weight: 2694.9 [oz_av]

## 2023-08-11 LAB — MAGNESIUM: Magnesium: 1.7 mg/dL (ref 1.7–2.4)

## 2023-08-11 LAB — CK: Total CK: 13813 U/L — ABNORMAL HIGH (ref 38–234)

## 2023-08-11 NOTE — Progress Notes (Signed)
PROGRESS NOTE        PATIENT DETAILS Name: Elizabeth Schwartz Age: 77 y.o. Sex: female Date of Birth: 02-01-1946 Admit Date: 08/09/2023 Admitting Physician Dorcas Carrow, MD WUJ:[id number], Charlcie Cradle, MD  Brief Summary: Patient is a 77 y.o.  female with history of HTN, DM-2, depression/schizoaffective disorder presented with a fall-neighbors found her pinned to the ground with the stove on top of her.  She was subsequently brought to the ED-she was found to have compressive injury to her left thigh along with AKI and rhabdomyolysis.  Significant events: 9/25>> admit to [facility]  Significant studies: 9/25>> CXR: Small pleural effusion. 9/25>> x-ray left femur: Negative for fracture 9/25>> x-ray pelvis:No fracture. 9/25>> CT head: No acute intracranial process. 9/25>> CT C-spine no acute 9/25>> CT chest/abdomen/pelvis: No acute posttraumatic sequelae.  Significant microbiology data: 9/25>> COVID/influenza/RSV PCR: Negative 9/25>> culture: No growth  Procedures: None  Consults: Orthopedics  Subjective: Pain in the left thigh-difficult to move-but otherwise appears comfortable.  Objective: Vitals: Blood pressure 104/89, pulse (!) 108, temperature 98.2 F (36.8 C), temperature source Oral, resp. rate 19, height 5\' 5"  (1.651 m), weight 76.4 kg, SpO2 96%.   Exam: Gen Exam:Alert awake-not in any distress HEENT:atraumatic, normocephalic Chest: B/L clear to auscultation anteriorly CVS:S1S2 regular Abdomen:soft non tender, non distended Extremities:no edema Neurology: Pain limits exam-however hardly any movement in LLE, RLE with weakness-but pain limits exam-she definitely has some movement on RLE-able to move legs side-to-side, wiggle toes, just about able to bend the knee.  Skin: no rash  Pertinent Labs/Radiology:    Latest Ref Rng & Units 08/10/2023    4:08 AM 08/09/2023    8:42 PM 07/13/2023   12:18 PM  CBC  WBC 4.0 - 10.5 K/uL 9.0  14.4  5.9   Hemoglobin  12.0 - 15.0 g/dL 78.2  95.6  21.3   Hematocrit 36.0 - 46.0 % 34.5  41.8  35.4   Platelets 150 - 400 K/uL 238  283  261     Lab Results  Component Value Date   NA 141 08/10/2023   K 3.8 08/10/2023   CL 107 08/10/2023   CO2 24 08/10/2023      Assessment/Plan: Trauma from stove falling on her left thigh Significant lower extremity weakness on exam today left> right Left thigh swelling secondary to compression injury Appreciate orthopedic input CT imaging of thoracic/lumbar spine ordered. Per Ortho-no evidence of compartment syndrome  Rhabdomyolysis Secondary to muscle compression from trauma/stove falling on her left thigh Continue IVF Trend CK-unfortunately labs still not back at this morning.  Transaminitis Secondary to rhabdo Trend LFTs Continue IVF  Elevated troponins Secondary to rhabdo Echo stable per cardiology  PNA Afebrile Rocephin/Zithromax  HLD Holding Zetia/fenofibrate until rhabdomyolysis improves  DM-2 (A1c 6.1 on 8/29) CBG stable SSI  Recent Labs    08/10/23 2208 08/11/23 0823 08/11/23 1108  GLUCAP 149* 150* 198*    Dementia Mood disorder Resume clozapine/Aricept/Namenda over the next several days. May not be safe for her to be living by herself at this point-await PT/OT/TOC eval.  BMI: Estimated body mass index is 28.03 kg/m as calculated from the following:   Height as of this encounter: 5\' 5"  (1.651 m).   Weight as of this encounter: 76.4 kg.   Code status:   Code Status: Full Code   DVT Prophylaxis: enoxaparin (LOVENOX) injection 40 mg Start:  08/10/23 1400    Family Communication: Niece [name]-[phone]-was contacted over the phone-however bad connection-and she hung up.   Disposition Plan: Status is: Inpatient Remains inpatient appropriate because: Severity of illness   Planned Discharge Destination:Skilled nursing facility   Diet: Diet Order             Diet Heart Room service appropriate? Yes; Fluid consistency:  Thin  Diet effective now                     Antimicrobial agents: Anti-infectives (From admission, onward)    Start     Dose/Rate Route Frequency Ordered Stop   08/10/23 0300  azithromycin (ZITHROMAX) 500 mg in sodium chloride 0.9 % 250 mL IVPB        500 mg 250 mL/hr over 60 Minutes Intravenous Every 24 hours 08/10/23 0208     08/09/23 2000  cefTRIAXone (ROCEPHIN) 2 g in sodium chloride 0.9 % 100 mL IVPB        2 g 200 mL/hr over 30 Minutes Intravenous Every 24 hours 08/09/23 1949 08/16/23 1959        MEDICATIONS: Scheduled Meds:  enoxaparin (LOVENOX) injection  40 mg Subcutaneous Q24H   insulin aspart  0-9 Units Subcutaneous TID WC   Continuous Infusions:  azithromycin 500 mg (08/11/23 0237)   cefTRIAXone (ROCEPHIN)  IV 2 g (08/10/23 2229)   lactated ringers 150 mL/hr at 08/11/23 0235   PRN Meds:.acetaminophen **OR** acetaminophen, ondansetron **OR** ondansetron (ZOFRAN) IV   I have personally reviewed following labs and imaging studies  LABORATORY DATA: CBC: Recent Labs  Lab 08/09/23 2042 08/10/23 0408  WBC 14.4* 9.0  NEUTROABS 12.1*  --   HGB 13.0 11.0*  HCT 41.8 34.5*  MCV 95.0 95.3  PLT 283 238    Basic Metabolic Panel: Recent Labs  Lab 08/09/23 2042 08/10/23 0408  NA 142 141  K 4.1 3.8  CL 106 107  CO2 18* 24  GLUCOSE 150* 175*  BUN 43* 37*  CREATININE 1.40* 1.28*  CALCIUM 8.7* 7.9*    GFR: Estimated Creatinine Clearance: 37.7 mL/min (A) (by C-G formula based on SCr of 1.28 mg/dL (H)).  Liver Function Tests: Recent Labs  Lab 08/09/23 2042 08/10/23 0408  AST 368* 316*  ALT 115* 94*  ALKPHOS 53 44  BILITOT 1.1 0.8  PROT 7.5 5.9*  ALBUMIN 3.8 2.9*   No results for input(s): "LIPASE", "AMYLASE" in the last 168 hours. No results for input(s): "AMMONIA" in the last 168 hours.  Coagulation Profile: Recent Labs  Lab 08/09/23 2042  INR 1.1    Cardiac Enzymes: Recent Labs  Lab 08/09/23 2042 08/10/23 0408 08/10/23 1804   CKTOTAL 19,249* 26,240* 19,727*    BNP (last 3 results) No results for input(s): "PROBNP" in the last 8760 hours.  Lipid Profile: No results for input(s): "CHOL", "HDL", "LDLCALC", "TRIG", "CHOLHDL", "LDLDIRECT" in the last 72 hours.  Thyroid Function Tests: No results for input(s): "TSH", "T4TOTAL", "FREET4", "T3FREE", "THYROIDAB" in the last 72 hours.  Anemia Panel: No results for input(s): "VITAMINB12", "FOLATE", "FERRITIN", "TIBC", "IRON", "RETICCTPCT" in the last 72 hours.  Urine analysis:    Component Value Date/Time   COLORURINE YELLOW 08/09/2023 2042   APPEARANCEUR CLEAR 08/09/2023 2042   LABSPEC 1.018 08/09/2023 2042   PHURINE 5.0 08/09/2023 2042   GLUCOSEU NEGATIVE 08/09/2023 2042   HGBUR LARGE (A) 08/09/2023 2042   BILIRUBINUR NEGATIVE 08/09/2023 2042   KETONESUR 5 (A) 08/09/2023 2042   PROTEINUR 30 (A) 08/09/2023 2042  UROBILINOGEN 1.0 09/03/2009 1956   NITRITE NEGATIVE 08/09/2023 2042   LEUKOCYTESUR NEGATIVE 08/09/2023 2042    Sepsis Labs: Lactic Acid, Venous    Component Value Date/Time   LATICACIDVEN 2.0 (HH) 08/10/2023 0416    MICROBIOLOGY: Recent Results (from the past 240 hour(s))  Resp panel by RT-PCR (RSV, Flu A&B, Covid) Urine, Catheterized     Status: None   Collection Time: 08/09/23  8:42 PM   Specimen: Urine, Catheterized; Nasal Swab  Result Value Ref Range Status   SARS Coronavirus 2 by RT PCR NEGATIVE NEGATIVE Final   Influenza A by PCR NEGATIVE NEGATIVE Final   Influenza B by PCR NEGATIVE NEGATIVE Final    Comment: (NOTE) The Xpert Xpress SARS-CoV-2/FLU/RSV plus assay is intended as an aid in the diagnosis of influenza from Nasopharyngeal swab specimens and should not be used as a sole basis for treatment. Nasal washings and aspirates are unacceptable for Xpert Xpress SARS-CoV-2/FLU/RSV testing.  Fact Sheet for Patients: BloggerCourse.com  Fact Sheet for Healthcare  Providers: SeriousBroker.it  This test is not yet approved or cleared by the [place] FDA and has been authorized for detection and/or diagnosis of SARS-CoV-2 by FDA under an Emergency Use Authorization (EUA). This EUA will remain in effect (meaning this test can be used) for the duration of the COVID-19 declaration under Section 564(b)(1) of the Act, 21 U.S.C. section 360bbb-3(b)(1), unless the authorization is terminated or revoked.     Resp Syncytial Virus by PCR NEGATIVE NEGATIVE Final    Comment: (NOTE) Fact Sheet for Patients: BloggerCourse.com  Fact Sheet for Healthcare Providers: SeriousBroker.it  This test is not yet approved or cleared by the [place] FDA and has been authorized for detection and/or diagnosis of SARS-CoV-2 by FDA under an Emergency Use Authorization (EUA). This EUA will remain in effect (meaning this test can be used) for the duration of the COVID-19 declaration under Section 564(b)(1) of the Act, 21 U.S.C. section 360bbb-3(b)(1), unless the authorization is terminated or revoked.  Performed at [facility], [address] [place], [place] [address]   Blood Culture (routine x 2)     Status: None (Preliminary result)   Collection Time: 08/09/23  8:42 PM   Specimen: BLOOD RIGHT HAND  Result Value Ref Range Status   Specimen Description BLOOD RIGHT HAND  Final   Special Requests   Final    BOTTLES DRAWN AEROBIC AND ANAEROBIC Blood Culture adequate volume   Culture   Final    NO GROWTH 2 DAYS Performed at [facility], [address] [place], [place] [address]    Report Status PENDING  Incomplete  Blood Culture (routine x 2)     Status: None (Preliminary result)   Collection Time: 08/09/23  8:42 PM   Specimen: BLOOD LEFT HAND  Result Value Ref Range Status   Specimen Description BLOOD LEFT HAND  Final   Special Requests   Final    BOTTLES DRAWN  AEROBIC AND ANAEROBIC Blood Culture adequate volume   Culture   Final    NO GROWTH 2 DAYS Performed at [facility], [address] [place], [place] [address]    Report Status PENDING  Incomplete    RADIOLOGY STUDIES/RESULTS: CT CHEST ABDOMEN PELVIS WO CONTRAST  Result Date: 08/09/2023 CLINICAL DATA:  Trauma, fall. EXAM: CT CHEST, ABDOMEN AND PELVIS WITHOUT CONTRAST TECHNIQUE: Multidetector CT imaging of the chest, abdomen and pelvis was performed following the standard protocol without IV contrast. RADIATION DOSE REDUCTION: This  exam was performed according to the departmental dose-optimization program which includes automated exposure control, adjustment of the mA and/or kV according to patient size and/or use of iterative reconstruction technique. COMPARISON:  CT angiogram chest 11/17/2011. FINDINGS: CT CHEST FINDINGS Cardiovascular: The heart is normal in size. The aorta is normal in size. There is calcified atherosclerotic disease throughout the aorta and coronary arteries. There is a small pericardial effusion. Mediastinum/Nodes: No enlarged mediastinal, hilar, or axillary lymph nodes. Thyroid gland, trachea, and esophagus demonstrate no significant findings. Small pericardial cyst is unchanged posterior to the left atrium measuring up to 13 mm. Lungs/Pleura: Mild emphysema is present. There is a small amount of airspace consolidation in the lingula. There are minimal peripheral ground-glass opacities scattered throughout the right lung. There is no pleural effusion or pneumothorax. Musculoskeletal: No acute fractures are seen. CT ABDOMEN PELVIS FINDINGS Hepatobiliary: Gallstones are present. There is no biliary ductal dilatation. No focal liver abnormality. Pancreas: Unremarkable. No pancreatic ductal dilatation or surrounding inflammatory changes. Spleen: Normal in size without focal abnormality. Adrenals/Urinary Tract: Low-density right adrenal nodule is compatible with adenoma measuring  13 mm, unchanged. The left adrenal gland is within normal limits. There is mild nonspecific bilateral perinephric fat stranding. There is no hydronephrosis or urinary tract calculus. The bladder is within normal limits. Stomach/Bowel: Stomach is within normal limits. Appendix appears normal. No evidence of bowel wall thickening, distention, or inflammatory changes. Vascular/Lymphatic: Aortic atherosclerosis. No enlarged abdominal or pelvic lymph nodes. Reproductive: Status post hysterectomy. No adnexal masses. Other: No abdominal wall hernia or abnormality. No abdominopelvic ascites. Musculoskeletal: No acute fractures are seen. IMPRESSION: 1. No acute posttraumatic sequelae in the chest, abdomen or pelvis. 2. Small amount of airspace consolidation in the lingula worrisome for infection. 3. Minimal peripheral ground-glass opacities in the right lung may be infectious/inflammatory. 4. Small pericardial effusion. 5. Cholelithiasis. 6. Stable right adrenal adenoma. Aortic Atherosclerosis (ICD10-I70.0). Electronically Signed   By: Darliss Cheney M.D.   On: 08/09/2023 23:50   CT Head Wo Contrast  Result Date: 08/09/2023 CLINICAL DATA:  Polytrauma, blunt.  Fall. EXAM: CT HEAD WITHOUT CONTRAST CT CERVICAL SPINE WITHOUT CONTRAST TECHNIQUE: Multidetector CT imaging of the head and cervical spine was performed following the standard protocol without intravenous contrast. Multiplanar CT image reconstructions of the cervical spine were also generated. RADIATION DOSE REDUCTION: This exam was performed according to the departmental dose-optimization program which includes automated exposure control, adjustment of the mA and/or kV according to patient size and/or use of iterative reconstruction technique. COMPARISON:  09/02/2022, 04/02/2021. FINDINGS: CT HEAD FINDINGS Brain: No acute intracranial hemorrhage, midline shift or mass effect. No extra-axial fluid collection. Diffuse atrophy is noted. Subcortical and periventricular  white matter hypodensities are present bilaterally. No hydrocephalus. Vascular: No hyperdense vessel or unexpected calcification. Skull: Normal. Negative for fracture or focal lesion. Sinuses/Orbits: Mucosal thickening is present in the ethmoid air cells. No acute orbital abnormality. Other: Subcutaneous fat stranding is present over the lateral aspect of the temporal bone on the right, possible contusion given history of trauma. CT CERVICAL SPINE FINDINGS Alignment: Normal. Skull base and vertebrae: No acute fracture. No primary bone lesion or focal pathologic process. Soft tissues and spinal canal: No prevertebral fluid or swelling. No visible canal hematoma. Disc levels: Multilevel intervertebral disc space narrowing, degenerative endplate changes, and facet arthropathy. Upper chest: No acute abnormality. Other: Carotid artery calcifications. IMPRESSION: 1. No acute intracranial process. 2. Atrophy with chronic microvascular ischemic changes. 3. Multilevel degenerative changes in the cervical spine without evidence  of acute fracture. Electronically Signed   By: Thornell Sartorius M.D.   On: 08/09/2023 23:44   CT Cervical Spine Wo Contrast  Result Date: 08/09/2023 CLINICAL DATA:  Polytrauma, blunt.  Fall. EXAM: CT HEAD WITHOUT CONTRAST CT CERVICAL SPINE WITHOUT CONTRAST TECHNIQUE: Multidetector CT imaging of the head and cervical spine was performed following the standard protocol without intravenous contrast. Multiplanar CT image reconstructions of the cervical spine were also generated. RADIATION DOSE REDUCTION: This exam was performed according to the departmental dose-optimization program which includes automated exposure control, adjustment of the mA and/or kV according to patient size and/or use of iterative reconstruction technique. COMPARISON:  09/02/2022, 04/02/2021. FINDINGS: CT HEAD FINDINGS Brain: No acute intracranial hemorrhage, midline shift or mass effect. No extra-axial fluid collection. Diffuse  atrophy is noted. Subcortical and periventricular white matter hypodensities are present bilaterally. No hydrocephalus. Vascular: No hyperdense vessel or unexpected calcification. Skull: Normal. Negative for fracture or focal lesion. Sinuses/Orbits: Mucosal thickening is present in the ethmoid air cells. No acute orbital abnormality. Other: Subcutaneous fat stranding is present over the lateral aspect of the temporal bone on the right, possible contusion given history of trauma. CT CERVICAL SPINE FINDINGS Alignment: Normal. Skull base and vertebrae: No acute fracture. No primary bone lesion or focal pathologic process. Soft tissues and spinal canal: No prevertebral fluid or swelling. No visible canal hematoma. Disc levels: Multilevel intervertebral disc space narrowing, degenerative endplate changes, and facet arthropathy. Upper chest: No acute abnormality. Other: Carotid artery calcifications. IMPRESSION: 1. No acute intracranial process. 2. Atrophy with chronic microvascular ischemic changes. 3. Multilevel degenerative changes in the cervical spine without evidence of acute fracture. Electronically Signed   By: Thornell Sartorius M.D.   On: 08/09/2023 23:44   DG Femur Min 2 Views Left  Result Date: 08/09/2023 CLINICAL DATA:  Unwitnessed fall. EXAM: LEFT FEMUR 2 VIEWS COMPARISON:  None Available. FINDINGS: There is no evidence of fracture or other focal bone lesions. Soft tissues are unremarkable. IMPRESSION: Negative. Electronically Signed   By: Aram Candela M.D.   On: 08/09/2023 22:18   DG Pelvis 1-2 Views  Result Date: 08/09/2023 CLINICAL DATA:  Unwitnessed fall. EXAM: PELVIS - 1-2 VIEW COMPARISON:  None Available. FINDINGS: There is no evidence of pelvic fracture or diastasis. No pelvic bone lesions are seen. IMPRESSION: Negative. Electronically Signed   By: Aram Candela M.D.   On: 08/09/2023 22:18   DG Chest Port 1 View  Result Date: 08/09/2023 CLINICAL DATA:  Questionable sepsis EXAM:  PORTABLE CHEST 1 VIEW COMPARISON:  Chest x-ray 03/25/2019 FINDINGS: The heart is enlarged. There is a new small left pleural effusion which may be partially loculated in the lower left hemithorax. The right lung is clear. No evidence for pneumothorax. No acute fractures are seen. IMPRESSION: 1. New small left pleural effusion which may be partially loculated in the lower left hemithorax. 2. Cardiomegaly. Electronically Signed   By: Darliss Cheney M.D.   On: 08/09/2023 21:14     LOS: 1 day   Jeoffrey Massed, MD  [facility]    To contact the attending provider between 7A-7P or the covering provider during after hours 7P-7A, please log into the web site [email or web address] and access using universal [place] password for that web site. If you do not have the password, please call the hospital operator.  08/11/2023, 10:57 AM

## 2023-08-11 NOTE — Progress Notes (Signed)
  Echocardiogram 2D Echocardiogram has been performed.  Milda Smart 08/11/2023, 10:03 AM

## 2023-08-11 NOTE — Consult Note (Signed)
Reason for Consult:R/o compartment syndrome Referring Physician: Jeoffrey Massed Time called: 0912 Time at bedside: 0927   Elizabeth Schwartz is an 77 y.o. female.  HPI: Elizabeth Schwartz was found down after not being seen for a couple of days. She had a stove door on top of her. She was brought to the ED and admitted with rhabdo among other things. She has some lower extremity swelling and orthopedic surgery was consulted the following day to r/o compartment syndrome. She is demented and cannot contribute meaningfully to history. Apparently she lives alone.  Past Medical History:  Diagnosis Date   Anemia    COPD (chronic obstructive pulmonary disease) (HCC)    Dementia (HCC)    Depression    Diabetes mellitus    GERD (gastroesophageal reflux disease)    Hyperlipidemia    Hypertension    Osteoporosis    Schizophrenia, schizo-affective (HCC)    SOB (shortness of breath) on exertion    Syncope 06/11/2019    Past Surgical History:  Procedure Laterality Date   BREAST BIOPSY Right 2017   benign   LAPAROSCOPY  1975   ABD pain   VAGINAL HYSTERECTOMY  1975    Family History  Problem Relation Age of Onset   Breast cancer Neg Hx     Social History:  reports that she quit smoking about 11 years ago. Her smoking use included cigarettes. She started smoking about 26 years ago. She has quit using smokeless tobacco.  Her smokeless tobacco use included chew. She reports that she does not currently use alcohol. She reports that she does not use drugs.  Allergies:  Allergies  Allergen Reactions   Crestor [Rosuvastatin]     Muscle aches.     Medications: I have reviewed the patient's current medications.  Results for orders placed or performed during the hospital encounter of 08/09/23 (from the past 48 hour(s))  Resp panel by RT-PCR (RSV, Flu A&B, Covid) Urine, Catheterized     Status: None   Collection Time: 08/09/23  8:42 PM   Specimen: Urine, Catheterized; Nasal Swab  Result Value Ref Range    SARS Coronavirus 2 by RT PCR NEGATIVE NEGATIVE   Influenza A by PCR NEGATIVE NEGATIVE   Influenza B by PCR NEGATIVE NEGATIVE    Comment: (NOTE) The Xpert Xpress SARS-CoV-2/FLU/RSV plus assay is intended as an aid in the diagnosis of influenza from Nasopharyngeal swab specimens and should not be used as a sole basis for treatment. Nasal washings and aspirates are unacceptable for Xpert Xpress SARS-CoV-2/FLU/RSV testing.  Fact Sheet for Patients: BloggerCourse.com  Fact Sheet for Healthcare Providers: SeriousBroker.it  This test is not yet approved or cleared by the [place] FDA and has been authorized for detection and/or diagnosis of SARS-CoV-2 by FDA under an Emergency Use Authorization (EUA). This EUA will remain in effect (meaning this test can be used) for the duration of the COVID-19 declaration under Section 564(b)(1) of the Act, 21 U.S.C. section 360bbb-3(b)(1), unless the authorization is terminated or revoked.     Resp Syncytial Virus by PCR NEGATIVE NEGATIVE    Comment: (NOTE) Fact Sheet for Patients: BloggerCourse.com  Fact Sheet for Healthcare Providers: SeriousBroker.it  This test is not yet approved or cleared by the [place] FDA and has been authorized for detection and/or diagnosis of SARS-CoV-2 by FDA under an Emergency Use Authorization (EUA). This EUA will remain in effect (meaning this test can be used) for the duration of the COVID-19 declaration under Section 564(b)(1) of the Act, 21 U.S.C.  section 360bbb-3(b)(1), unless the authorization is terminated or revoked.  Performed at [facility], [address], [place], [place] [address]   Comprehensive metabolic panel     Status: Abnormal   Collection Time: 08/09/23  8:42 PM  Result Value Ref Range   Sodium 142 135 - 145 mmol/L   Potassium 4.1 3.5 - 5.1 mmol/L   Chloride 106 98 - 111  mmol/L   CO2 18 (L) 22 - 32 mmol/L   Glucose, Bld 150 (H) 70 - 99 mg/dL    Comment: Glucose reference range applies only to samples taken after fasting for at least 8 hours.   BUN 43 (H) 8 - 23 mg/dL   Creatinine, Ser 6.04 (H) 0.44 - 1.00 mg/dL   Calcium 8.7 (L) 8.9 - 10.3 mg/dL   Total Protein 7.5 6.5 - 8.1 g/dL   Albumin 3.8 3.5 - 5.0 g/dL   AST 540 (H) 15 - 41 U/L   ALT 115 (H) 0 - 44 U/L   Alkaline Phosphatase 53 38 - 126 U/L   Total Bilirubin 1.1 0.3 - 1.2 mg/dL   GFR, Estimated 39 (L) >60 mL/min    Comment: (NOTE) Calculated using the CKD-EPI Creatinine Equation (2021)    Anion gap 18 (H) 5 - 15    Comment: Performed at [facility], [address] [place], [place] [address]  CBC with Differential     Status: Abnormal   Collection Time: 08/09/23  8:42 PM  Result Value Ref Range   WBC 14.4 (H) 4.0 - 10.5 K/uL   RBC 4.40 3.87 - 5.11 MIL/uL   Hemoglobin 13.0 12.0 - 15.0 g/dL   HCT 14.7 82.9 - 56.2 %   MCV 95.0 80.0 - 100.0 fL   MCH 29.5 26.0 - 34.0 pg   MCHC 31.1 30.0 - 36.0 g/dL   RDW 13.0 86.5 - 78.4 %   Platelets 283 150 - 400 K/uL   nRBC 0.0 0.0 - 0.2 %   Neutrophils Relative % 85 %   Neutro Abs 12.1 (H) 1.7 - 7.7 K/uL   Lymphocytes Relative 5 %   Lymphs Abs 0.8 0.7 - 4.0 K/uL   Monocytes Relative 9 %   Monocytes Absolute 1.3 (H) 0.1 - 1.0 K/uL   Eosinophils Relative 0 %   Eosinophils Absolute 0.0 0.0 - 0.5 K/uL   Basophils Relative 0 %   Basophils Absolute 0.0 0.0 - 0.1 K/uL   Immature Granulocytes 1 %   Abs Immature Granulocytes 0.12 (H) 0.00 - 0.07 K/uL    Comment: Performed at [facility], [address] [place], [place] [address]  Blood Culture (routine x 2)     Status: None (Preliminary result)   Collection Time: 08/09/23  8:42 PM   Specimen: BLOOD RIGHT HAND  Result Value Ref Range   Specimen Description BLOOD RIGHT HAND    Special Requests      BOTTLES DRAWN AEROBIC AND ANAEROBIC Blood Culture adequate volume   Culture      NO GROWTH  2 DAYS Performed at [facility], [address] [place], [place] [address]    Report Status PENDING   Blood Culture (routine x 2)     Status: None (Preliminary result)   Collection Time: 08/09/23  8:42 PM   Specimen: BLOOD LEFT HAND  Result Value Ref Range   Specimen Description BLOOD LEFT HAND    Special Requests      BOTTLES DRAWN AEROBIC AND  ANAEROBIC Blood Culture adequate volume   Culture      NO GROWTH 2 DAYS Performed at [facility], [address] [place], [place] [address]    Report Status PENDING   Urinalysis, w/ Reflex to Culture (Infection Suspected) -Urine, Catheterized     Status: Abnormal   Collection Time: 08/09/23  8:42 PM  Result Value Ref Range   Specimen Source URINE, CATHETERIZED    Color, Urine YELLOW YELLOW   APPearance CLEAR CLEAR   Specific Gravity, Urine 1.018 1.005 - 1.030   pH 5.0 5.0 - 8.0   Glucose, UA NEGATIVE NEGATIVE mg/dL   Hgb urine dipstick LARGE (A) NEGATIVE   Bilirubin Urine NEGATIVE NEGATIVE   Ketones, ur 5 (A) NEGATIVE mg/dL   Protein, ur 30 (A) NEGATIVE mg/dL   Nitrite NEGATIVE NEGATIVE   Leukocytes,Ua NEGATIVE NEGATIVE   RBC / HPF 0-5 0 - 5 RBC/hpf   WBC, UA 0-5 0 - 5 WBC/hpf    Comment:        Reflex urine culture not performed if WBC <=10, OR if Squamous epithelial cells >5. If Squamous epithelial cells >5 suggest recollection.    Bacteria, UA NONE SEEN NONE SEEN   Squamous Epithelial / HPF 0-5 0 - 5 /HPF   Mucus PRESENT     Comment: Performed at [facility], [address] [place], [place] [address]  CK     Status: Abnormal   Collection Time: 08/09/23  8:42 PM  Result Value Ref Range   Total CK 19,249 (H) 38 - 234 U/L    Comment: RESULTS CONFIRMED BY MANUAL DILUTION Performed at [facility], [address] [place], [place] [address]   Protime-INR     Status: None   Collection Time: 08/09/23  8:42 PM  Result Value Ref Range   Prothrombin Time 14.6 11.4 - 15.2 seconds   INR 1.1 0.8 - 1.2     Comment: (NOTE) INR goal varies based on device and disease states. Performed at [facility], [address], [place], [place] [address]   APTT     Status: Abnormal   Collection Time: 08/09/23  8:42 PM  Result Value Ref Range   aPTT 20 (L) 24 - 36 seconds    Comment: Performed at [facility], [address] [place], [place] [address]  I-Stat Lactic Acid, ED     Status: Abnormal   Collection Time: 08/09/23  8:54 PM  Result Value Ref Range   Lactic Acid, Venous 3.5 (HH) 0.5 - 1.9 mmol/L   Comment NOTIFIED PHYSICIAN   I-Stat Lactic Acid, ED     Status: Abnormal   Collection Time: 08/09/23 11:13 PM  Result Value Ref Range   Lactic Acid, Venous 5.2 (HH) 0.5 - 1.9 mmol/L   Comment NOTIFIED PHYSICIAN   Troponin I (High Sensitivity)     Status: Abnormal   Collection Time: 08/10/23 12:15 AM  Result Value Ref Range   Troponin I (High Sensitivity) 360 (HH) <18 ng/L    Comment: CRITICAL RESULT CALLED TO, READ BACK BY AND VERIFIED WITH Cruzita Lederer RN 08/10/23 @0115  BY J. WHITE (NOTE) Elevated high sensitivity troponin I (hsTnI) values and significant  changes across serial measurements may suggest ACS but many other  chronic and acute conditions are known to elevate hsTnI results.  Refer to the "Links" section for chest pain algorithms and additional  guidance. Performed at [facility], [address], [place],  [place] [address]   Troponin I (High Sensitivity)     Status: Abnormal   Collection Time: 08/10/23  1:04 AM  Result Value Ref Range   Troponin I (High Sensitivity) 319 (HH) <18 ng/L    Comment: CRITICAL VALUE NOTED. VALUE IS CONSISTENT WITH PREVIOUSLY REPORTED/CALLED VALUE (NOTE) Elevated high sensitivity troponin I (hsTnI) values and significant  changes across serial measurements may suggest ACS but many other  chronic and acute conditions are known to elevate hsTnI results.  Refer to the "Links" section for chest pain algorithms and additional   guidance. Performed at [facility], [address] [place], [place] [address]   I-Stat Lactic Acid     Status: Abnormal   Collection Time: 08/10/23  1:22 AM  Result Value Ref Range   Lactic Acid, Venous 3.2 (HH) 0.5 - 1.9 mmol/L   Comment NOTIFIED PHYSICIAN   CBC     Status: Abnormal   Collection Time: 08/10/23  4:08 AM  Result Value Ref Range   WBC 9.0 4.0 - 10.5 K/uL   RBC 3.62 (L) 3.87 - 5.11 MIL/uL   Hemoglobin 11.0 (L) 12.0 - 15.0 g/dL   HCT 09.8 (L) 11.9 - 14.7 %   MCV 95.3 80.0 - 100.0 fL   MCH 30.4 26.0 - 34.0 pg   MCHC 31.9 30.0 - 36.0 g/dL   RDW 82.9 56.2 - 13.0 %   Platelets 238 150 - 400 K/uL   nRBC 0.0 0.0 - 0.2 %    Comment: Performed at [facility], [address] [place], [place] [address]  Comprehensive metabolic panel     Status: Abnormal   Collection Time: 08/10/23  4:08 AM  Result Value Ref Range   Sodium 141 135 - 145 mmol/L   Potassium 3.8 3.5 - 5.1 mmol/L   Chloride 107 98 - 111 mmol/L   CO2 24 22 - 32 mmol/L   Glucose, Bld 175 (H) 70 - 99 mg/dL    Comment: Glucose reference range applies only to samples taken after fasting for at least 8 hours.   BUN 37 (H) 8 - 23 mg/dL   Creatinine, Ser 4.69 (H) 0.44 - 1.00 mg/dL   Calcium 7.9 (L) 8.9 - 10.3 mg/dL   Total Protein 5.9 (L) 6.5 - 8.1 g/dL   Albumin 2.9 (L) 3.5 - 5.0 g/dL   AST 629 (H) 15 - 41 U/L   ALT 94 (H) 0 - 44 U/L   Alkaline Phosphatase 44 38 - 126 U/L   Total Bilirubin 0.8 0.3 - 1.2 mg/dL   GFR, Estimated 43 (L) >60 mL/min    Comment: (NOTE) Calculated using the CKD-EPI Creatinine Equation (2021)    Anion gap 10 5 - 15    Comment: Performed at [facility], [address], [place], [place] [address]  CK     Status: Abnormal   Collection Time: 08/10/23  4:08 AM  Result Value Ref Range   Total CK 26,240 (H) 38 - 234 U/L    Comment: RESULT CONFIRMED BY MANUAL DILUTION Performed at [facility], [address] [place], [place] [address]   I-Stat Lactic Acid      Status: Abnormal   Collection Time: 08/10/23  4:16 AM  Result Value Ref Range   Lactic Acid, Venous 2.0 (HH) 0.5 - 1.9 mmol/L  CBG monitoring, ED     Status: Abnormal   Collection Time: 08/10/23  8:05 AM  Result Value Ref Range  Glucose-Capillary 162 (H) 70 - 99 mg/dL    Comment: Glucose reference range applies only to samples taken after fasting for at least 8 hours.  Glucose, capillary     Status: Abnormal   Collection Time: 08/10/23 12:23 PM  Result Value Ref Range   Glucose-Capillary 173 (H) 70 - 99 mg/dL    Comment: Glucose reference range applies only to samples taken after fasting for at least 8 hours.  Glucose, capillary     Status: Abnormal   Collection Time: 08/10/23  3:35 PM  Result Value Ref Range   Glucose-Capillary 189 (H) 70 - 99 mg/dL    Comment: Glucose reference range applies only to samples taken after fasting for at least 8 hours.  CK     Status: Abnormal   Collection Time: 08/10/23  6:04 PM  Result Value Ref Range   Total CK 19,727 (H) 38 - 234 U/L    Comment: RESULT CONFIRMED BY MANUAL DILUTION Performed at [facility], [address], [place], [place] [address]   Glucose, capillary     Status: Abnormal   Collection Time: 08/10/23 10:08 PM  Result Value Ref Range   Glucose-Capillary 149 (H) 70 - 99 mg/dL    Comment: Glucose reference range applies only to samples taken after fasting for at least 8 hours.  Glucose, capillary     Status: Abnormal   Collection Time: 08/11/23  8:23 AM  Result Value Ref Range   Glucose-Capillary 150 (H) 70 - 99 mg/dL    Comment: Glucose reference range applies only to samples taken after fasting for at least 8 hours.    CT CHEST ABDOMEN PELVIS WO CONTRAST  Result Date: 08/09/2023 CLINICAL DATA:  Trauma, fall. EXAM: CT CHEST, ABDOMEN AND PELVIS WITHOUT CONTRAST TECHNIQUE: Multidetector CT imaging of the chest, abdomen and pelvis was performed following the standard protocol without IV contrast. RADIATION DOSE  REDUCTION: This exam was performed according to the departmental dose-optimization program which includes automated exposure control, adjustment of the mA and/or kV according to patient size and/or use of iterative reconstruction technique. COMPARISON:  CT angiogram chest 11/17/2011. FINDINGS: CT CHEST FINDINGS Cardiovascular: The heart is normal in size. The aorta is normal in size. There is calcified atherosclerotic disease throughout the aorta and coronary arteries. There is a small pericardial effusion. Mediastinum/Nodes: No enlarged mediastinal, hilar, or axillary lymph nodes. Thyroid gland, trachea, and esophagus demonstrate no significant findings. Small pericardial cyst is unchanged posterior to the left atrium measuring up to 13 mm. Lungs/Pleura: Mild emphysema is present. There is a small amount of airspace consolidation in the lingula. There are minimal peripheral ground-glass opacities scattered throughout the right lung. There is no pleural effusion or pneumothorax. Musculoskeletal: No acute fractures are seen. CT ABDOMEN PELVIS FINDINGS Hepatobiliary: Gallstones are present. There is no biliary ductal dilatation. No focal liver abnormality. Pancreas: Unremarkable. No pancreatic ductal dilatation or surrounding inflammatory changes. Spleen: Normal in size without focal abnormality. Adrenals/Urinary Tract: Low-density right adrenal nodule is compatible with adenoma measuring 13 mm, unchanged. The left adrenal gland is within normal limits. There is mild nonspecific bilateral perinephric fat stranding. There is no hydronephrosis or urinary tract calculus. The bladder is within normal limits. Stomach/Bowel: Stomach is within normal limits. Appendix appears normal. No evidence of bowel wall thickening, distention, or inflammatory changes. Vascular/Lymphatic: Aortic atherosclerosis. No enlarged abdominal or pelvic lymph nodes. Reproductive: Status post hysterectomy. No adnexal masses. Other: No abdominal  wall hernia or abnormality. No abdominopelvic ascites. Musculoskeletal: No acute  fractures are seen. IMPRESSION: 1. No acute posttraumatic sequelae in the chest, abdomen or pelvis. 2. Small amount of airspace consolidation in the lingula worrisome for infection. 3. Minimal peripheral ground-glass opacities in the right lung may be infectious/inflammatory. 4. Small pericardial effusion. 5. Cholelithiasis. 6. Stable right adrenal adenoma. Aortic Atherosclerosis (ICD10-I70.0). Electronically Signed   By: Darliss Cheney M.D.   On: 08/09/2023 23:50   CT Head Wo Contrast  Result Date: 08/09/2023 CLINICAL DATA:  Polytrauma, blunt.  Fall. EXAM: CT HEAD WITHOUT CONTRAST CT CERVICAL SPINE WITHOUT CONTRAST TECHNIQUE: Multidetector CT imaging of the head and cervical spine was performed following the standard protocol without intravenous contrast. Multiplanar CT image reconstructions of the cervical spine were also generated. RADIATION DOSE REDUCTION: This exam was performed according to the departmental dose-optimization program which includes automated exposure control, adjustment of the mA and/or kV according to patient size and/or use of iterative reconstruction technique. COMPARISON:  09/02/2022, 04/02/2021. FINDINGS: CT HEAD FINDINGS Brain: No acute intracranial hemorrhage, midline shift or mass effect. No extra-axial fluid collection. Diffuse atrophy is noted. Subcortical and periventricular white matter hypodensities are present bilaterally. No hydrocephalus. Vascular: No hyperdense vessel or unexpected calcification. Skull: Normal. Negative for fracture or focal lesion. Sinuses/Orbits: Mucosal thickening is present in the ethmoid air cells. No acute orbital abnormality. Other: Subcutaneous fat stranding is present over the lateral aspect of the temporal bone on the right, possible contusion given history of trauma. CT CERVICAL SPINE FINDINGS Alignment: Normal. Skull base and vertebrae: No acute fracture. No primary  bone lesion or focal pathologic process. Soft tissues and spinal canal: No prevertebral fluid or swelling. No visible canal hematoma. Disc levels: Multilevel intervertebral disc space narrowing, degenerative endplate changes, and facet arthropathy. Upper chest: No acute abnormality. Other: Carotid artery calcifications. IMPRESSION: 1. No acute intracranial process. 2. Atrophy with chronic microvascular ischemic changes. 3. Multilevel degenerative changes in the cervical spine without evidence of acute fracture. Electronically Signed   By: Thornell Sartorius M.D.   On: 08/09/2023 23:44   CT Cervical Spine Wo Contrast  Result Date: 08/09/2023 CLINICAL DATA:  Polytrauma, blunt.  Fall. EXAM: CT HEAD WITHOUT CONTRAST CT CERVICAL SPINE WITHOUT CONTRAST TECHNIQUE: Multidetector CT imaging of the head and cervical spine was performed following the standard protocol without intravenous contrast. Multiplanar CT image reconstructions of the cervical spine were also generated. RADIATION DOSE REDUCTION: This exam was performed according to the departmental dose-optimization program which includes automated exposure control, adjustment of the mA and/or kV according to patient size and/or use of iterative reconstruction technique. COMPARISON:  09/02/2022, 04/02/2021. FINDINGS: CT HEAD FINDINGS Brain: No acute intracranial hemorrhage, midline shift or mass effect. No extra-axial fluid collection. Diffuse atrophy is noted. Subcortical and periventricular white matter hypodensities are present bilaterally. No hydrocephalus. Vascular: No hyperdense vessel or unexpected calcification. Skull: Normal. Negative for fracture or focal lesion. Sinuses/Orbits: Mucosal thickening is present in the ethmoid air cells. No acute orbital abnormality. Other: Subcutaneous fat stranding is present over the lateral aspect of the temporal bone on the right, possible contusion given history of trauma. CT CERVICAL SPINE FINDINGS Alignment: Normal. Skull  base and vertebrae: No acute fracture. No primary bone lesion or focal pathologic process. Soft tissues and spinal canal: No prevertebral fluid or swelling. No visible canal hematoma. Disc levels: Multilevel intervertebral disc space narrowing, degenerative endplate changes, and facet arthropathy. Upper chest: No acute abnormality. Other: Carotid artery calcifications. IMPRESSION: 1. No acute intracranial process. 2. Atrophy with chronic microvascular ischemic changes. 3. Multilevel degenerative  changes in the cervical spine without evidence of acute fracture. Electronically Signed   By: Thornell Sartorius M.D.   On: 08/09/2023 23:44   DG Femur Min 2 Views Left  Result Date: 08/09/2023 CLINICAL DATA:  Unwitnessed fall. EXAM: LEFT FEMUR 2 VIEWS COMPARISON:  None Available. FINDINGS: There is no evidence of fracture or other focal bone lesions. Soft tissues are unremarkable. IMPRESSION: Negative. Electronically Signed   By: Aram Candela M.D.   On: 08/09/2023 22:18   DG Pelvis 1-2 Views  Result Date: 08/09/2023 CLINICAL DATA:  Unwitnessed fall. EXAM: PELVIS - 1-2 VIEW COMPARISON:  None Available. FINDINGS: There is no evidence of pelvic fracture or diastasis. No pelvic bone lesions are seen. IMPRESSION: Negative. Electronically Signed   By: Aram Candela M.D.   On: 08/09/2023 22:18   DG Chest Port 1 View  Result Date: 08/09/2023 CLINICAL DATA:  Questionable sepsis EXAM: PORTABLE CHEST 1 VIEW COMPARISON:  Chest x-ray 03/25/2019 FINDINGS: The heart is enlarged. There is a new small left pleural effusion which may be partially loculated in the lower left hemithorax. The right lung is clear. No evidence for pneumothorax. No acute fractures are seen. IMPRESSION: 1. New small left pleural effusion which may be partially loculated in the lower left hemithorax. 2. Cardiomegaly. Electronically Signed   By: Darliss Cheney M.D.   On: 08/09/2023 21:14    Review of Systems  Unable to perform ROS: Dementia    Blood pressure 104/89, pulse (!) 108, temperature 98.2 F (36.8 C), temperature source Oral, resp. rate 19, height 5\' 5"  (1.651 m), weight 76.4 kg, SpO2 96%. Physical Exam Constitutional:      General: She is not in acute distress.    Appearance: She is well-developed. She is not diaphoretic.  HENT:     Head: Normocephalic and atraumatic.  Eyes:     General: No scleral icterus.       Right eye: No discharge.        Left eye: No discharge.     Conjunctiva/sclera: Conjunctivae normal.  Cardiovascular:     Rate and Rhythm: Normal rate and regular rhythm.  Pulmonary:     Effort: Pulmonary effort is normal. No respiratory distress.  Musculoskeletal:     Cervical back: Normal range of motion.     Comments: BLE No traumatic wounds, ecchymosis, or rash  Large bulla medial left thigh, decompressed. ?mild TTP bilateral thighs, compartments soft  No knee or ankle effusion  Knee stable to varus/ valgus and anterior/posterior stress  Sens DPN, SPN, TN intact reportedly  Motor EHL, ext, flex, evers 0/5  DP 2+, PT 2+ right, 1+/1+ left, 1+ edema right, 2+ edema left  Skin:    General: Skin is warm and dry.  Neurological:     Mental Status: She is alert.  Psychiatric:        Mood and Affect: Mood normal.        Behavior: Behavior normal.     Assessment/Plan: Bilateral leg edema -- Would recommend aggressive elevation. I'm concerned that she seems to have no motor fxn in her lower extremities. Will get CT T/L spine to r/o fx though sig fx unlikely from GLF. No e/o compartment syndrome thankfully.   Netta Cedars, MD Orthopaedic Surgery [facility]

## 2023-08-11 NOTE — TOC Initial Note (Signed)
Transition of Care [facility]) - Initial/Assessment Note    Patient Details  Name: Romeka Blackwood MRN: [id number] Date of Birth: 1946/05/28  Transition of Care [facility]) CM/SW Contact:    Baldemar Lenis, LCSW Phone Number: 08/11/2023, 1:02 PM  Clinical Narrative:       CSW spoke with French Ana, the social worker at [facility] senior living apartment, to obtain information about patient's prior living situation and family support. Patient is from home alone and does not have any family. She does have a niece and nephew but they are not involved (niece's name and number listed on the chart, will attempt to contact). Patient has been living in her apartment for about 7 years, so French Ana has known her for some time. Patient is usually well groomed, is fully independent, still drives. However, French Ana has noticed some recent decline with her cognition and patient has been reporting feeling weak. Patient has periodically discussed moving into ALF (specifically [facility]) and French Ana has assisted with placement, but patient always changes her mind before they make enough progress to get her moved in. Patient has also been falling prey to scams more recently, but has been refusing any assistance with managing her finances or a payee to assist. French Ana believes the patient receives around $1400 a month, but does not know exact amount. She does know that she has some money in the bank to pay for moving into a facility but does not know exact, because the patient has been private about her finances.   Patient sees Dr. Shan Levans for PCP and sees Dr. Merlyn Albert at [facility] for her psychiatry. French Ana indicates that SNF would be the best disposition for the patient, and will assist the patient as much as she can but does have limitations as she is not family or guardian. CSW to follow.            Expected Discharge Plan: Skilled Nursing Facility Barriers to Discharge: Continued Medical Work up, English as a second language teacher   Patient  Goals and CMS Choice Patient states their goals for this hospitalization and ongoing recovery are:: patient unable to participate in goal setting, not fully oriented CMS Medicare.gov Compare Post Acute Care list provided to:: Patient Represenative (must comment) Choice offered to / list presented to :  (Child psychotherapist at [employer])      Expected Discharge Plan and Services       Living arrangements for the past 2 months: Apartment                                      Prior Living Arrangements/Services Living arrangements for the past 2 months: Apartment Lives with:: Self Patient language and need for interpreter reviewed:: No Do you feel safe going back to the place where you live?: Yes      Need for Family Participation in Patient Care: Yes (Comment) Care giver support system in place?: No (comment)   Criminal Activity/Legal Involvement Pertinent to Current Situation/Hospitalization: No - Comment as needed  Activities of Daily Living      Permission Sought/Granted Permission sought to share information with : Facility Industrial/product designer granted to share information with : Yes, Verbal Permission Granted  Share Information with NAME: French Ana     Permission granted to share info w Relationship: Child psychotherapist     Emotional Assessment   Attitude/Demeanor/Rapport: Unable to Assess Affect (typically observed): Unable to Assess Orientation: : Oriented to Self,  Oriented to Place Alcohol / Substance Use: Not Applicable Psych Involvement: Yes (comment)  Admission diagnosis:  Rhabdomyolysis [M62.82] Elevated troponin [R79.89] Crushing injury of left thigh, initial encounter [S77.12XA] Altered mental status, unspecified altered mental status type [R41.82] Patient Active Problem List   Diagnosis Date Noted   Rhabdomyolysis 08/10/2023   AKI (acute kidney injury) (HCC) 08/10/2023   Transaminitis 08/10/2023   CAP (community acquired pneumonia) 08/10/2023   Lactic  acidosis 08/10/2023   Elevated troponin 08/10/2023   Pressure injury of skin with suspected deep tissue injury 08/10/2023   Porokeratosis 01/02/2023   Dementia due to Alzheimer's disease (HCC) 08/11/2022   Statin myopathy 04/21/2022   Pain due to onychomycosis of toenails of both feet 02/22/2022   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) 11/22/2021   Chronic pain of right hip 09/15/2021   Chronic pain of left knee 09/15/2021   Chronic left shoulder pain 05/24/2021   Mixed hyperlipidemia 03/22/2021   Pincer nail deformity 11/03/2020   Diabetes type 2, controlled (HCC) 06/11/2019   Schizophrenia (HCC) 03/20/2019   Chronic diarrhea 03/20/2019   Hypertension 08/22/2016   Anemia 08/22/2016   Major neurocognitive disorder due to multiple etiologies with behavioral disturbance (HCC) 08/22/2016   Osteoporosis 08/22/2016   Asthma, moderate persistent 09/08/2011   Diverticulosis 09/08/2011   Former smoker 09/08/2011   GERD 06/04/2007   PCP:  Storm Frisk, MD Pharmacy:   [facility] - [place], [place] - [address] [place] [address] Phone: [phone] Fax: [phone]  [facility] - [place], [place] - [address] [facility] [address] [place] [address] Phone: [phone] Fax: [phone]  [facility] [address] [place] [address] Phone: [phone] Fax: [phone]     Social Determinants of Health (SDOH) Social History: SDOH Screenings   Food Insecurity: No Food Insecurity (03/22/2023)  Housing: Low Risk  (03/22/2023)  Transportation Needs: No Transportation Needs (03/22/2023)  Utilities: Not At Risk (03/22/2023)  Alcohol Screen: Low Risk  (03/22/2023)  Depression (PHQ2-9): Low Risk  (07/13/2023)  Financial Resource Strain: Low Risk  (03/22/2023)  Physical Activity: Insufficiently Active (03/22/2023)  Social Connections: Socially Isolated (03/22/2023)   Stress: No Stress Concern Present (03/22/2023)  Tobacco Use: Medium Risk (08/09/2023)   SDOH Interventions:     Readmission Risk Interventions     No data to display

## 2023-08-11 NOTE — Evaluation (Signed)
Occupational Therapy Evaluation Patient Details Name: Elizabeth Schwartz MRN: [id number] DOB: [birth year] Today's Date: 08/11/2023   History of Present Illness Pt is a 77 y/o F admitted on 08/09/23. EMS was called for welfare check & pt was found on the ground pinned underneath the stove door, pt was confused. Pt is being treated for acute rhabdomyolysis & L thigh injury. PMH: dementia, HTN, schizoaffective disorder, DM2   Clinical Impression   Pt admitted for above, presents with very poor command following and needs significant assist for OOB mobility recommend +2. OT originally going to wait until spinal clearance but RN needed assist with returning pt back to bed for CT scan. She remains very confused and far from her baseline, needing Min to Total A for ADLs due to poor sequence and initiation skills.  Pt would benefit from continued acute skilled OT services to address deficits and help transition to next level of care. Patient would benefit from post acute skilled rehab facility with <3 hours of therapy and 24/7 support       If plan is discharge home, recommend the following: Two people to help with walking and/or transfers;A lot of help with bathing/dressing/bathroom;Assistance with cooking/housework;Direct supervision/assist for financial management;Help with stairs or ramp for entrance;Direct supervision/assist for medications management;Supervision due to cognitive status    Functional Status Assessment  Patient has had a recent decline in their functional status and demonstrates the ability to make significant improvements in function in a reasonable and predictable amount of time.  Equipment Recommendations  None recommended by OT (TBD at next level of care)    Recommendations for Other Services       Precautions / Restrictions Precautions Precautions: Fall Restrictions Weight Bearing Restrictions: No      Mobility Bed Mobility Overal bed mobility: Needs Assistance Bed  Mobility: Sit to Supine       Sit to supine: Total assist, +2 for safety/equipment, +2 for physical assistance   General bed mobility comments: up in recliner on arrival. Not following commands to scoot back in bed or reposition    Transfers Overall transfer level: Needs assistance Equipment used: Rolling walker (2 wheels) Transfers: Sit to/from Stand Sit to Stand: Max assist, +2 physical assistance, +2 safety/equipment Stand pivot transfers: Total assist (significant assist, posterior lean)         General transfer comment: needs significant cueing to facilitate an anterior lean for STS      Balance Overall balance assessment: Needs assistance Sitting-balance support: Feet supported, Bilateral upper extremity supported Sitting balance-Leahy Scale: Poor Sitting balance - Comments: leaned back in recliner Postural control: Posterior lean Standing balance support: Bilateral upper extremity supported, Reliant on assistive device for balance, During functional activity Standing balance-Leahy Scale: Poor Standing balance comment: Max A +2 with RW                           ADL either performed or assessed with clinical judgement   ADL Overall ADL's : Needs assistance/impaired Eating/Feeding: Bed level;Set up   Grooming: Sitting;Cueing for sequencing;Moderate assistance   Upper Body Bathing: Sitting;Cueing for sequencing;Minimal assistance   Lower Body Bathing: Sitting/lateral leans;Total assistance   Upper Body Dressing : Sitting;Maximal assistance;Cueing for sequencing   Lower Body Dressing: Sitting/lateral leans;Total assistance   Toilet Transfer: +2 for physical assistance;Total assistance   Toileting- Clothing Manipulation and Hygiene: Total assistance         General ADL Comments: Very poor command following, not producing any mobility  for steps and strong posterior resistance     Vision         Perception         Praxis          Pertinent Vitals/Pain Pain Assessment Pain Assessment: Faces Faces Pain Scale: Hurts little more Pain Location: L side of her body Pain Descriptors / Indicators: Aching, Sore Pain Intervention(s): Monitored during session     Extremity/Trunk Assessment Upper Extremity Assessment Upper Extremity Assessment: Generalized weakness (PROM WFL for BLEs, soreness/pain to L side limits LUE AROM)   Lower Extremity Assessment Lower Extremity Assessment:  (BLE edema (L>R), pt with weeping from LLE, appears as if blisters have popped on LLE) RLE Deficits / Details: 2/5 knee extension in sitting LLE Deficits / Details: unable to perform LLE LAQ without assistance while sitting EOB, notes this is 2/2 pain & weakness       Communication Communication Communication: No apparent difficulties Cueing Techniques: Verbal cues;Tactile cues;Gestural cues   Cognition Arousal: Alert Behavior During Therapy: WFL for tasks assessed/performed Overall Cognitive Status: No family/caregiver present to determine baseline cognitive functioning Area of Impairment: Memory, Following commands, Safety/judgement, Problem solving, Awareness                     Memory: Decreased recall of precautions, Decreased short-term memory Following Commands: Follows one step commands inconsistently, Follows one step commands with increased time Safety/Judgement: Decreased awareness of safety, Decreased awareness of deficits Awareness: Intellectual Problem Solving: Slow processing, Decreased initiation, Difficulty sequencing, Requires verbal cues, Requires tactile cues General Comments: Pt not following commands to scoot back in bed, instead wiggled her bottom. Not understanding that she needs to lean forward in preparation for mobility. very pleasant overall     General Comments  VSS on RA, Originally was going to wait until spinal clearance but RN needed assist with returning pt back to bed for CT scan     Exercises     Shoulder Instructions      Home Living Family/patient expects to be discharged to:: Private residence                             Home Equipment: Gilmer Mor - single point   Additional Comments: Per chart, pt from her own private residence, pt reports an independent living apartment that's a part of a community but unable to elaborate. Pt notes she lives alone.      Prior Functioning/Environment Prior Level of Function : Independent/Modified Independent             Mobility Comments: Pt reports she uses a cane at baseline. ADLs Comments: Pt reports independence with all ADLs, manages her own finances.        OT Problem List: Decreased cognition;Impaired balance (sitting and/or standing);Decreased safety awareness;Decreased strength      OT Treatment/Interventions: Self-care/ADL training;Balance training;Therapeutic exercise;Therapeutic activities;Cognitive remediation/compensation;Patient/family education    OT Goals(Current goals can be found in the care plan section) Acute Rehab OT Goals Patient Stated Goal: none stated OT Goal Formulation: Patient unable to participate in goal setting Time For Goal Achievement: 08/25/23 Potential to Achieve Goals: Good  OT Frequency: Min 1X/week    Co-evaluation              AM-PAC OT "6 Clicks" Daily Activity     Outcome Measure Help from another person eating meals?: A Little Help from another person taking care of personal grooming?: A Lot Help from  another person toileting, which includes using toliet, bedpan, or urinal?: Total Help from another person bathing (including washing, rinsing, drying)?: A Lot Help from another person to put on and taking off regular upper body clothing?: A Lot Help from another person to put on and taking off regular lower body clothing?: Total 6 Click Score: 11   End of Session Equipment Utilized During Treatment: Gait belt;Rolling walker (2 wheels) Nurse  Communication: Mobility status (+2 Max A)  Activity Tolerance: Patient tolerated treatment well Patient left: in bed;with call bell/phone within reach;with bed alarm set  OT Visit Diagnosis: Unsteadiness on feet (R26.81);Other abnormalities of gait and mobility (R26.89);Other symptoms and signs involving cognitive function;Muscle weakness (generalized) (M62.81)                Time: 1610-9604 OT Time Calculation (min): 19 min Charges:  OT General Charges $OT Visit: 1 Visit OT Evaluation $OT Eval Moderate Complexity: 1 Mod  08/11/2023  AB, OTR/L  Acute Rehabilitation Services  Office: [phone]   Tristan Schroeder 08/11/2023, 1:47 PM

## 2023-08-11 NOTE — Plan of Care (Signed)

## 2023-08-11 NOTE — Evaluation (Signed)
Physical Therapy Evaluation Patient Details Name: Elizabeth Schwartz MRN: [id number] DOB: [birth year] Today's Date: 08/11/2023  History of Present Illness  Pt is a 77 y/o F admitted on 08/09/23. EMS was called for welfare check & pt was found on the ground pinned underneath the stove door, pt was confused. Pt is being treated for acute rhabdomyolysis & L thigh injury. PMH: dementia, HTN, schizoaffective disorder, DM2  Clinical Impression  Pt seen for PT evaluation with pt agreeable. Pt is pleasant but presents with impaired cognition, only oriented to self & somewhat oriented to location. Pt reports prior to admission she was living alone, mod I with SPC. On this date, pt presents with generalized & BLE weakness (LLE>RLE) with BLE edema (L>R). Pt with poor ability to follow commands & fatigues quickly with minimal movement. Pt requires max assist for bed mobility, mod assist for STS, and total assist for stand pivot bed>recliner. Pt would benefit from ongoing acute PT services to address strengthening, balance, endurance, to increase independence & reduce fall risk with mobility.        If plan is discharge home, recommend the following: Two people to help with walking and/or transfers;Two people to help with bathing/dressing/bathroom;Direct supervision/assist for medications management;Help with stairs or ramp for entrance;Assist for transportation;Assistance with feeding;Assistance with cooking/housework;Direct supervision/assist for financial management   Can travel by private vehicle   No    Equipment Recommendations  (defer to next venue)  Recommendations for Other Services       Functional Status Assessment Patient has had a recent decline in their functional status and demonstrates the ability to make significant improvements in function in a reasonable and predictable amount of time.     Precautions / Restrictions Precautions Precautions: Fall Restrictions Weight Bearing Restrictions: No       Mobility  Bed Mobility Overal bed mobility: Needs Assistance Bed Mobility: Supine to Sit     Supine to sit: Max assist, HOB elevated, Used rails     General bed mobility comments: assistance to move BLE to EOB, upright trunk    Transfers Overall transfer level: Needs assistance Equipment used: Rolling walker (2 wheels) Transfers: Sit to/from Stand, Bed to chair/wheelchair/BSC Sit to Stand: Mod assist (Pt requires multimodal cuing for initiation but is able to participate in anterior weight shifting & powering up to standing with RW & mod assist.) Stand pivot transfers: Total assist (Pt with decreased ability to follow instructs, does not initiate stepping, requires assistance to fully turn to recliner & safely sit on edge of seat.)         General transfer comment: Pt performs multiple STS from EOB with RW & mod assist with multimodal cuing to initiate & attempt. Pt will abruptly sit without notifying PT of need to do so. Pt requires total assist for transfer bed>recliner on L 2/2 BLE & overall weakness but also 2/2 impaired cognition & awareness. After stand pivot bed>recliner pt sits on very edge of seat then begins lying backwards. PT has to block LE knee & provide cuing for anterior weight shifting to scoot buttocks back x 2 attempts to fully sit in recliner safety. Pt extremely fatigued with this activity.    Ambulation/Gait                  Stairs            Wheelchair Mobility     Tilt Bed    Modified Rankin (Stroke Patients Only)       Balance Overall  balance assessment: Needs assistance Sitting-balance support: Feet supported, Bilateral upper extremity supported Sitting balance-Leahy Scale: Fair Sitting balance - Comments: close supervision static sitting   Standing balance support: Bilateral upper extremity supported, Reliant on assistive device for balance, During functional activity Standing balance-Leahy Scale: Poor Standing balance  comment: BUE support on RW for static standing                             Pertinent Vitals/Pain Pain Assessment Pain Assessment: Faces Faces Pain Scale: Hurts little more Pain Location: "all over" Pain Descriptors / Indicators: Discomfort Pain Intervention(s): Monitored during session    Home Living Family/patient expects to be discharged to:: Private residence                 Home Equipment: Gilmer Mor - single point Additional Comments: Per chart, pt from her own private residence, pt reports an independent living apartment that's a part of a community but unable to elaborate. Pt notes she lives alone.    Prior Function               Mobility Comments: Pt reports she uses a cane at baseline. ADLs Comments: Pt reports independence with all ADLs, manages her own finances.     Extremity/Trunk Assessment   Upper Extremity Assessment Upper Extremity Assessment: Generalized weakness;Defer to OT evaluation    Lower Extremity Assessment Lower Extremity Assessment: RLE deficits/detail;LLE deficits/detail (BLE edema (L>R), pt with weeping from LLE, appears as if blisters have popped on LLE) RLE Deficits / Details: 2/5 knee extension in sitting LLE Deficits / Details: unable to perform LLE LAQ without assistance while sitting EOB, notes this is 2/2 pain & weakness       Communication   Communication Communication: No apparent difficulties  Cognition Arousal: Alert Behavior During Therapy: WFL for tasks assessed/performed Overall Cognitive Status: No family/caregiver present to determine baseline cognitive functioning Area of Impairment: Orientation, Memory, Following commands, Safety/judgement, Awareness, Problem solving                 Orientation Level: Disoriented to, Time, Situation (oriented to hospital)   Memory: Decreased recall of precautions, Decreased short-term memory Following Commands: Follows one step commands inconsistently, Follows one  step commands with increased time Safety/Judgement: Decreased awareness of safety, Decreased awareness of deficits Awareness: Intellectual Problem Solving: Slow processing, Decreased initiation, Difficulty sequencing, Requires verbal cues, Requires tactile cues General Comments: Pt is oriented to self only (states her name & birthday, reports she's 76 vs 77).        General Comments General comments (skin integrity, edema, etc.): Pt on room air, Max HR 130 bpm with transfer, requires extra time to lower. Nurse called to room & made aware of weeping/draining LLE, HR, transfer abilities & impaired cognition.    Exercises     Assessment/Plan    PT Assessment Patient needs continued PT services  PT Problem List Decreased strength;Pain;Cardiopulmonary status limiting activity;Decreased range of motion;Decreased activity tolerance;Decreased cognition;Decreased knowledge of use of DME;Decreased balance;Decreased safety awareness;Decreased mobility;Decreased knowledge of precautions;Decreased skin integrity       PT Treatment Interventions DME instruction;Balance training;Neuromuscular re-education;Gait training;Stair training;Patient/family education;Functional mobility training;Therapeutic activities;Therapeutic exercise;Manual techniques;Cognitive remediation;Modalities    PT Goals (Current goals can be found in the Care Plan section)  Acute Rehab PT Goals Patient Stated Goal: none satted PT Goal Formulation: Patient unable to participate in goal setting Time For Goal Achievement: 08/25/23 Potential to Achieve Goals: Fair  Frequency Min 1X/week     Co-evaluation               AM-PAC PT "6 Clicks" Mobility  Outcome Measure Help needed turning from your back to your side while in a flat bed without using bedrails?: A Little Help needed moving from lying on your back to sitting on the side of a flat bed without using bedrails?: Total Help needed moving to and from a bed to a  chair (including a wheelchair)?: Total Help needed standing up from a chair using your arms (e.g., wheelchair or bedside chair)?: A Lot Help needed to walk in hospital room?: Total Help needed climbing 3-5 steps with a railing? : Total 6 Click Score: 9    End of Session Equipment Utilized During Treatment: Gait belt Activity Tolerance: Patient limited by fatigue Patient left: in chair;with chair alarm set;with call bell/phone within reach Nurse Communication: Mobility status PT Visit Diagnosis: Difficulty in walking, not elsewhere classified (R26.2);Muscle weakness (generalized) (M62.81);Unsteadiness on feet (R26.81);Other abnormalities of gait and mobility (R26.89)    Time: 1038-1100 PT Time Calculation (min) (ACUTE ONLY): 22 min   Charges:   PT Evaluation $PT Eval Moderate Complexity: 1 Mod   PT General Charges $$ ACUTE PT VISIT: 1 Visit         Aleda Grana, PT, DPT 08/11/23, 11:34 AM   Sandi Mariscal 08/11/2023, 11:32 AM

## 2023-08-11 NOTE — Progress Notes (Signed)
Reviewed echo images. Normal LV systolic function without regional wall motion abnormalities. No plan for additional cardiac workup.

## 2023-08-12 DIAGNOSIS — G309 Alzheimer's disease, unspecified: Secondary | ICD-10-CM | POA: Diagnosis not present

## 2023-08-12 DIAGNOSIS — T796XXA Traumatic ischemia of muscle, initial encounter: Secondary | ICD-10-CM | POA: Diagnosis not present

## 2023-08-12 DIAGNOSIS — S7712XA Crushing injury of left thigh, initial encounter: Secondary | ICD-10-CM | POA: Diagnosis not present

## 2023-08-12 DIAGNOSIS — M6282 Rhabdomyolysis: Secondary | ICD-10-CM | POA: Diagnosis not present

## 2023-08-12 DIAGNOSIS — R7989 Other specified abnormal findings of blood chemistry: Secondary | ICD-10-CM | POA: Diagnosis not present

## 2023-08-12 LAB — VITAMIN B12: Vitamin B-12: 358 pg/mL (ref 180–914)

## 2023-08-12 LAB — GLUCOSE, CAPILLARY
Glucose-Capillary: 131 mg/dL — ABNORMAL HIGH (ref 70–99)
Glucose-Capillary: 158 mg/dL — ABNORMAL HIGH (ref 70–99)
Glucose-Capillary: 179 mg/dL — ABNORMAL HIGH (ref 70–99)
Glucose-Capillary: 138 mg/dL — ABNORMAL HIGH (ref 70–99)

## 2023-08-12 LAB — CBC
HCT: 28.1 % — ABNORMAL LOW (ref 36.0–46.0)
Hemoglobin: 8.7 g/dL — ABNORMAL LOW (ref 12.0–15.0)
MCH: 29.2 pg (ref 26.0–34.0)
MCHC: 31 g/dL (ref 30.0–36.0)
MCV: 94.3 fL (ref 80.0–100.0)
Platelets: 221 10*3/uL (ref 150–400)
RBC: 2.98 MIL/uL — ABNORMAL LOW (ref 3.87–5.11)
RDW: 14.4 % (ref 11.5–15.5)
WBC: 7.1 10*3/uL (ref 4.0–10.5)
nRBC: 0 % (ref 0.0–0.2)

## 2023-08-12 LAB — COMPREHENSIVE METABOLIC PANEL
ALT: 66 U/L — ABNORMAL HIGH (ref 0–44)
AST: 160 U/L — ABNORMAL HIGH (ref 15–41)
Albumin: 2 g/dL — ABNORMAL LOW (ref 3.5–5.0)
Alkaline Phosphatase: 60 U/L (ref 38–126)
Anion gap: 8 (ref 5–15)
BUN: 30 mg/dL — ABNORMAL HIGH (ref 8–23)
CO2: 23 mmol/L (ref 22–32)
Calcium: 7.3 mg/dL — ABNORMAL LOW (ref 8.9–10.3)
Chloride: 108 mmol/L (ref 98–111)
Creatinine, Ser: 1.01 mg/dL — ABNORMAL HIGH (ref 0.44–1.00)
GFR, Estimated: 57 mL/min — ABNORMAL LOW (ref >60)
Glucose, Bld: 150 mg/dL — ABNORMAL HIGH (ref 70–99)
Potassium: 3.6 mmol/L (ref 3.5–5.1)
Sodium: 139 mmol/L (ref 135–145)
Total Bilirubin: 0.4 mg/dL (ref 0.3–1.2)
Total Protein: 4.8 g/dL — ABNORMAL LOW (ref 6.5–8.1)

## 2023-08-12 LAB — TSH: TSH: 2.99 u[IU]/mL (ref 0.350–4.500)

## 2023-08-12 LAB — FOLATE: Folate: 10 ng/mL (ref >5.9)

## 2023-08-12 LAB — SEDIMENTATION RATE: Sed Rate: 100 mm/h — ABNORMAL HIGH (ref 0–22)

## 2023-08-12 MED ORDER — CLOZAPINE 100 MG PO TABS
200.0000 mg | ORAL_TABLET | Freq: Every day | ORAL | Status: DC
  Administered 2023-08-12 – 2023-08-17 (×6): 200 mg via ORAL
  Filled 2023-08-12 (×7): qty 2

## 2023-08-12 MED ORDER — DONEPEZIL HCL 10 MG PO TABS
10.0000 mg | ORAL_TABLET | Freq: Every day | ORAL | Status: DC
  Administered 2023-08-12 – 2023-08-17 (×6): 10 mg via ORAL
  Filled 2023-08-12 (×6): qty 1

## 2023-08-12 MED ORDER — MEMANTINE HCL 10 MG PO TABS
5.0000 mg | ORAL_TABLET | Freq: Two times a day (BID) | ORAL | Status: DC
  Administered 2023-08-12 – 2023-08-18 (×13): 5 mg via ORAL
  Filled 2023-08-12 (×13): qty 1

## 2023-08-12 NOTE — Consult Note (Signed)
Neurology Consultation Reason for Consult: leg weakness Referring Physician: Dr. Jerral Ralph  CC: s/p fall  History is obtained from:patient and chart review  HPI: Elizabeth Schwartz is a 77 y.o. female with PMH significant for DM2, HTN, depression/schizoaffective disorder presented with a fall-neighbors found her pinned to the ground with the stove on top of her. She was subsequently brought to the ED-she was found to have compressive injury to her left thigh along with AKI and rhabdomyolysis. Neurology consulted for bilateral leg weakness.  On exam, she exhibits generalized weakness, but definite increased weakness to LLE, where compressive injury occurred.    ROS: A 14 point ROS was performed and is negative except as noted in the HPI.   Past Medical History:  Diagnosis Date   Anemia    COPD (chronic obstructive pulmonary disease) (HCC)    Dementia (HCC)    Depression    Diabetes mellitus    GERD (gastroesophageal reflux disease)    Hyperlipidemia    Hypertension    Osteoporosis    Schizophrenia, schizo-affective (HCC)    SOB (shortness of breath) on exertion    Syncope 06/11/2019    Family History  Problem Relation Age of Onset   Breast cancer Neg Hx     Social History:  reports that she quit smoking about 11 years ago. Her smoking use included cigarettes. She started smoking about 26 years ago. She has quit using smokeless tobacco.  Her smokeless tobacco use included chew. She reports that she does not currently use alcohol. She reports that she does not use drugs.  Exam: Current vital signs: BP (!) 130/58 (BP Location: Right Arm)   Pulse 94   Temp 98.6 F (37 C) (Oral)   Resp 15   Ht 5\' 5"  (1.651 m)   Wt 76.4 kg   SpO2 97%   BMI 28.03 kg/m  Vital signs in last 24 hours: Temp:  [98 F (36.7 C)-98.6 F (37 C)] 98.6 F (37 C) (09/28 0800) Pulse Rate:  [94-102] 94 (09/28 0800) Resp:  [11-20] 15 (09/28 0800) BP: (120-142)/(58-64) 130/58 (09/28 0800) SpO2:  [91 %-99 %]  97 % (09/28 0800)   Physical Exam  Appears well-developed and well-nourished.   Neuro: Mental Status: Patient is awake, alert, oriented to person, place, and situation.She is unable to say the month or year.  Patient is unable to give a clear and coherent history. She is able to provide some details but does not remember falling or coming into the hospital.  She states that she can usually say what the month and year is. Dementia diagnosis at baseline.  Cranial Nerves: II: Visual Fields are full. Pupils are equal, round, and reactive to light.   III,IV, VI: EOMI without ptosis or diploplia.  V: Facial sensation is symmetric to temperature VII: Facial movement is symmetric.  VIII: hearing is intact to voice X: Uvula elevates symmetrically XI: Shoulder shrug is symmetric. XII: tongue is midline without atrophy Motor: Tone is normal. Bulk is normal.  Generalized weakness present. 4+/5 BUE with symmetric grips.  4+/5 RLE hip and knee, 4-/5 plantar and dorsal flexion 3/5 LLE throughout. Only able to lift slightly off bed against gravity. 2/5 dorsal and plantar flexion.  Sensory: Decreased on LLE. Denies tingling or paraesthesia.  Cerebellar: FNF intact bilaterally, unable to perform HKS w/ LLE, intact with weakness on RLE.   I have reviewed labs in epic and the results pertinent to this consultation are:  CK: 19249 -> 26240 -> 19727 -> 13813  BUN: 30 Cr: 1.01 Ca: 7.3 Protein: 4.8 Albumin: 2.0 AST/ALT: 160/66 UA: Negative Blood Culture: Prelim Neg   I have reviewed the images obtained:  MRI Thoracic Spine: Negative MRI Lumbar Spine: Negative CT Thoracic Spine: Negative CT Lumbar Spine: Negative  CT Head: Negative  Left Femur Xray: Negative, no fracture.   Impression:   Generalized weakness is present, but patients left leg is much weaker than her right leg. She was able to stand up with PT and denies any pain when doing so. Likely that the generalized weakness is d/t  the rhabdo/inflammation, time down after her fall combined with the now 3 days in a hospital bed without much movement and the LLE weakness is due to comp[ressive injury s/p being pinned by the stove that fell on her.  At this point, there is no indication for MRI brain   Recommendations:  - Dementia panel labs, Vitamin B1 - Continue patients home dementia medications  Namenda and Aricept - day/night cycles, avoid deliriogenic medications - PT/OT  ATTENDING ATTESTATION:  She gets confused easily. Likely baseline cognitive decline vs delirium from medication. Left leg is weak and swollen. Initially not able to move right leg but then will lift off bed. Normal UE strength, some pain in shoulders limiting ROM. No back pain, no urinary symptoms.  MRI lumbar/thoracic spine neg.  No indication for further imaging at this point. PT/OT.   Neurology will sign off. Call if there is worsening or no improvement.   Dr. Viviann Spare evaluated pt independently, reviewed imaging, chart, labs. Discussed and formulated plan with the Resident/APP. Changes were made to the note where appropriate. Please see APP/resident note above for details.     Lakely Elmendorf,MD

## 2023-08-12 NOTE — Progress Notes (Signed)
Subjective:    Patient reports pain as appropriately controlled. Sleeping on arrival.  Objective:   VITALS:  Temp:  [98 F (36.7 C)-98.6 F (37 C)] 98.6 F (37 C) (09/28 0800) Pulse Rate:  [94-102] 94 (09/28 0800) Resp:  [11-20] 15 (09/28 0800) BP: (120-142)/(58-64) 130/58 (09/28 0800) SpO2:  [83 %-99 %] 83 % (09/28 0800)  Gen: AAOx3, NAD Comfortable at rest  Left Lower Extremity: Mild TTP over thigh All compartments soft compressible No pain with passive stretch 0/5 motor ADF/APF/EHL DP, PT 2+ to palp CR < 2s    LABS Recent Labs    08/09/23 2042 08/10/23 0408 08/11/23 1530  HGB 13.0 11.0* 9.2*  WBC 14.4* 9.0 8.3  PLT 283 238 223   Recent Labs    08/11/23 1530 08/12/23 0627  NA 141 139  K 3.5 3.6  CL 107 108  CO2 24 23  BUN 35* 30*  CREATININE 1.05* 1.01*  GLUCOSE 126* 150*   Recent Labs    08/09/23 2042  INR 1.1     Assessment/Plan:  77 y.o.  female with history of HTN, DM-2, depression/schizoaffective disorder presented with a fall-neighbors found down on 08/11/23 with thigh rhabdomyolysis  -clinical situation and imaging reviewed at length with patient at bedside -no acute surgical intervention  -profound acute loss of neuro function LLE remains concerning and warrants urgent spine/neuro workup -PT/OT eval  Netta Cedars 08/12/2023, 10:04 AM

## 2023-08-12 NOTE — Progress Notes (Addendum)
PROGRESS NOTE        PATIENT DETAILS Name: Elizabeth Schwartz Age: 77 y.o. Sex: female Date of Birth: 1946/02/04 Admit Date: 08/09/2023 Admitting Physician Dorcas Carrow, MD NWG:NFAOZH, Charlcie Cradle, MD  Brief Summary: Patient is a 77 y.o.  female with history of HTN, DM-2, depression/schizoaffective disorder presented with a fall-neighbors found her pinned to the ground with the stove on top of her.  She was subsequently brought to the ED-she was found to have compressive injury to her left thigh along with AKI and rhabdomyolysis.  Significant events: 9/25>> admit to [facility]  Significant studies: 9/25>> CXR: Small pleural effusion. 9/25>> x-ray left femur: Negative for fracture 9/25>> x-ray pelvis:No fracture. 9/25>> CT head: No acute intracranial process. 9/25>> CT C-spine no acute 9/25>> CT chest/abdomen/pelvis: No acute posttraumatic sequelae. 9/27>> CT L-spine: No fracture 9/27 >>CT T-spine: No fracture 9/27>> MRI lumbar/thoracic spine: No acute abnormality.  Significant microbiology data: 9/25>> COVID/influenza/RSV PCR: Negative 9/25>> culture: No growth  Procedures: None  Consults: Orthopedics  Subjective: Awake/alert-some pain in the left thigh area-unable to wiggle toes when asked repeatedly.  Able to somewhat move her right lower extremity.  She has no recollection as to how she got to the hospital and how the stove ended up on top of her left leg.  Objective: Vitals: Blood pressure (!) 130/58, pulse 94, temperature 98.6 F (37 C), temperature source Oral, resp. rate 15, height 5\' 5"  (1.651 m), weight 76.4 kg, SpO2 (!) 83%.   Exam: Gen Exam:Alert awake-not in any distress HEENT:atraumatic, normocephalic Chest: B/L clear to auscultation anteriorly CVS:S1S2 regular Abdomen:soft non tender, non distended Extremities: Mild left thigh swelling persists Neurology: Pain limits exam of left leg-cannot wiggle toes when asked her-if at all-just moved  LLE side-to-side-RLE appears weak-but is able to slowly bend at the level of knee-able to move with side-to-side.  Sensation appears grossly intact in the upper thigh. Skin: no rash  Skin: no rash  Pertinent Labs/Radiology:    Latest Ref Rng & Units 08/11/2023    3:30 PM 08/10/2023    4:08 AM 08/09/2023    8:42 PM  CBC  WBC 4.0 - 10.5 K/uL 8.3  9.0  14.4   Hemoglobin 12.0 - 15.0 g/dL 9.2  08.6  57.8   Hematocrit 36.0 - 46.0 % 29.1  34.5  41.8   Platelets 150 - 400 K/uL 223  238  283     Lab Results  Component Value Date   NA 139 08/12/2023   K 3.6 08/12/2023   CL 108 08/12/2023   CO2 23 08/12/2023      Assessment/Plan: Trauma from stove falling on her left thigh Left thigh swelling secondary to compression injury Ortho following-no signs of compartment  syndrome. PT/OT  Bilateral lower extremity weakness (left> right) Pain limits exam-she appears to have some psych issues/chronic dysfunction Unclear if she had lower extremity weakness prior to this hospitalization-but claims she was able to walk around independently.  Unclear if this contributed to her falling. CT/MRI imaging of lumbar/thoracic spine without any fractures or significant injury We will get neurology opinion today.  AKI Essentially resolved Likely due to rhabdo  Rhabdomyolysis Secondary to muscle compression from trauma/stove falling on her left thigh Continue IVF-but decrease rate to 75 cc an hour  Transaminitis Secondary to rhabdo Trend LFTs Continue IVF  Elevated troponins Secondary to rhabdo Echo stable  abnormality of the thoracic or lumbar spine. 2. Mild lower lumbar degenerative disc disease without spinal canal or neural foraminal stenosis. Electronically Signed   By: Deatra Robinson M.D.   On: 08/11/2023 23:55   CT THORACIC SPINE WO CONTRAST  Result Date: 08/11/2023 CLINICAL DATA:  Mid-back pain, neuro deficit. Bilateral lower extremity weakness. Trauma. EXAM: CT THORACIC SPINE WITHOUT CONTRAST TECHNIQUE: Multidetector CT images of the thoracic were obtained using the standard protocol without intravenous contrast. RADIATION DOSE REDUCTION: This exam was performed according to the departmental dose-optimization program which includes automated exposure control, adjustment of the mA and/or kV according to patient size and/or use of iterative reconstruction technique. COMPARISON:  CT cervical spine and CT chest, abdomen, and pelvis 08/09/2023 FINDINGS: Alignment: Normal. Vertebrae: No acute fracture or suspicious osseous lesion. Prominent anterior vertebral osteophytes from T3-T11, including solidly fused osteophytes at T4-5, T8-9, and T9-10. Paraspinal and other soft tissues: Aortic and coronary atherosclerosis. Limited assessment of the lungs due to respiratory motion artifact. Dependent atelectasis in the left lower lobe. Disc levels: No evidence of high-grade spinal canal or neural foraminal stenosis in the thoracic spine. Partially visualized spondylosis in the cervical spine, more fully evaluated on the recent dedicated cervical spine CT. IMPRESSION: 1. No acute osseous abnormality identified in the thoracic  spine. 2. Thoracic spondylosis without high-grade stenosis. 3.  Aortic Atherosclerosis (ICD10-I70.0). Electronically Signed   By: Sebastian Ache M.D.   On: 08/11/2023 14:16   CT LUMBAR SPINE WO CONTRAST  Result Date: 08/11/2023 CLINICAL DATA:  Trauma. Status post fall. Bilateral lower extremity weakness. EXAM: CT LUMBAR SPINE WITHOUT CONTRAST TECHNIQUE: Multidetector CT imaging of the lumbar spine was performed without intravenous contrast administration. Multiplanar CT image reconstructions were also generated. RADIATION DOSE REDUCTION: This exam was performed according to the departmental dose-optimization program which includes automated exposure control, adjustment of the mA and/or kV according to patient size and/or use of iterative reconstruction technique. COMPARISON:  08/09/2023 FINDINGS: Segmentation: 5 lumbar type vertebrae. Alignment: Normal. Vertebrae: No acute fracture or focal pathologic process. Paraspinal and other soft tissues: Negative. Disc levels: The disc spaces are relatively well preserved. Mild multi level ventral endplate spurring is noted. IMPRESSION: 1. No evidence for lumbar spine fracture or subluxation. 2. Mild multilevel ventral endplate spurring. Electronically Signed   By: Signa Kell M.D.   On: 08/11/2023 13:02   ECHOCARDIOGRAM COMPLETE  Result Date: 08/11/2023    ECHOCARDIOGRAM REPORT   Patient Name:   Elizabeth Schwartz Date of Exam: 08/11/2023 Medical Rec #:  [id number]   Height:       65.0 in Accession #:    [id number]  Weight:       168.4 lb Date of Birth:  April 20, 1946   BSA:          1.839 m Patient Age:    77 years    BP:           104/89 mmHg Patient Gender: F           HR:           103 bpm. Exam Location:  Inpatient Procedure: 2D Echo, Cardiac Doppler and Color Doppler Indications:    Syncope  History:        Patient has no prior history of Echocardiogram examinations.                 COPD, Signs/Symptoms:Syncope and Dementia; Risk                 Factors:Hypertension,  abnormality of the thoracic or lumbar spine. 2. Mild lower lumbar degenerative disc disease without spinal canal or neural foraminal stenosis. Electronically Signed   By: Deatra Robinson M.D.   On: 08/11/2023 23:55   CT THORACIC SPINE WO CONTRAST  Result Date: 08/11/2023 CLINICAL DATA:  Mid-back pain, neuro deficit. Bilateral lower extremity weakness. Trauma. EXAM: CT THORACIC SPINE WITHOUT CONTRAST TECHNIQUE: Multidetector CT images of the thoracic were obtained using the standard protocol without intravenous contrast. RADIATION DOSE REDUCTION: This exam was performed according to the departmental dose-optimization program which includes automated exposure control, adjustment of the mA and/or kV according to patient size and/or use of iterative reconstruction technique. COMPARISON:  CT cervical spine and CT chest, abdomen, and pelvis 08/09/2023 FINDINGS: Alignment: Normal. Vertebrae: No acute fracture or suspicious osseous lesion. Prominent anterior vertebral osteophytes from T3-T11, including solidly fused osteophytes at T4-5, T8-9, and T9-10. Paraspinal and other soft tissues: Aortic and coronary atherosclerosis. Limited assessment of the lungs due to respiratory motion artifact. Dependent atelectasis in the left lower lobe. Disc levels: No evidence of high-grade spinal canal or neural foraminal stenosis in the thoracic spine. Partially visualized spondylosis in the cervical spine, more fully evaluated on the recent dedicated cervical spine CT. IMPRESSION: 1. No acute osseous abnormality identified in the thoracic  spine. 2. Thoracic spondylosis without high-grade stenosis. 3.  Aortic Atherosclerosis (ICD10-I70.0). Electronically Signed   By: Sebastian Ache M.D.   On: 08/11/2023 14:16   CT LUMBAR SPINE WO CONTRAST  Result Date: 08/11/2023 CLINICAL DATA:  Trauma. Status post fall. Bilateral lower extremity weakness. EXAM: CT LUMBAR SPINE WITHOUT CONTRAST TECHNIQUE: Multidetector CT imaging of the lumbar spine was performed without intravenous contrast administration. Multiplanar CT image reconstructions were also generated. RADIATION DOSE REDUCTION: This exam was performed according to the departmental dose-optimization program which includes automated exposure control, adjustment of the mA and/or kV according to patient size and/or use of iterative reconstruction technique. COMPARISON:  08/09/2023 FINDINGS: Segmentation: 5 lumbar type vertebrae. Alignment: Normal. Vertebrae: No acute fracture or focal pathologic process. Paraspinal and other soft tissues: Negative. Disc levels: The disc spaces are relatively well preserved. Mild multi level ventral endplate spurring is noted. IMPRESSION: 1. No evidence for lumbar spine fracture or subluxation. 2. Mild multilevel ventral endplate spurring. Electronically Signed   By: Signa Kell M.D.   On: 08/11/2023 13:02   ECHOCARDIOGRAM COMPLETE  Result Date: 08/11/2023    ECHOCARDIOGRAM REPORT   Patient Name:   Elizabeth Schwartz Date of Exam: 08/11/2023 Medical Rec #:  [id number]   Height:       65.0 in Accession #:    [id number]  Weight:       168.4 lb Date of Birth:  April 20, 1946   BSA:          1.839 m Patient Age:    77 years    BP:           104/89 mmHg Patient Gender: F           HR:           103 bpm. Exam Location:  Inpatient Procedure: 2D Echo, Cardiac Doppler and Color Doppler Indications:    Syncope  History:        Patient has no prior history of Echocardiogram examinations.                 COPD, Signs/Symptoms:Syncope and Dementia; Risk                 Factors:Hypertension,  PROGRESS NOTE        PATIENT DETAILS Name: Elizabeth Schwartz Age: 77 y.o. Sex: female Date of Birth: 1946/02/04 Admit Date: 08/09/2023 Admitting Physician Dorcas Carrow, MD NWG:NFAOZH, Charlcie Cradle, MD  Brief Summary: Patient is a 77 y.o.  female with history of HTN, DM-2, depression/schizoaffective disorder presented with a fall-neighbors found her pinned to the ground with the stove on top of her.  She was subsequently brought to the ED-she was found to have compressive injury to her left thigh along with AKI and rhabdomyolysis.  Significant events: 9/25>> admit to [facility]  Significant studies: 9/25>> CXR: Small pleural effusion. 9/25>> x-ray left femur: Negative for fracture 9/25>> x-ray pelvis:No fracture. 9/25>> CT head: No acute intracranial process. 9/25>> CT C-spine no acute 9/25>> CT chest/abdomen/pelvis: No acute posttraumatic sequelae. 9/27>> CT L-spine: No fracture 9/27 >>CT T-spine: No fracture 9/27>> MRI lumbar/thoracic spine: No acute abnormality.  Significant microbiology data: 9/25>> COVID/influenza/RSV PCR: Negative 9/25>> culture: No growth  Procedures: None  Consults: Orthopedics  Subjective: Awake/alert-some pain in the left thigh area-unable to wiggle toes when asked repeatedly.  Able to somewhat move her right lower extremity.  She has no recollection as to how she got to the hospital and how the stove ended up on top of her left leg.  Objective: Vitals: Blood pressure (!) 130/58, pulse 94, temperature 98.6 F (37 C), temperature source Oral, resp. rate 15, height 5\' 5"  (1.651 m), weight 76.4 kg, SpO2 (!) 83%.   Exam: Gen Exam:Alert awake-not in any distress HEENT:atraumatic, normocephalic Chest: B/L clear to auscultation anteriorly CVS:S1S2 regular Abdomen:soft non tender, non distended Extremities: Mild left thigh swelling persists Neurology: Pain limits exam of left leg-cannot wiggle toes when asked her-if at all-just moved  LLE side-to-side-RLE appears weak-but is able to slowly bend at the level of knee-able to move with side-to-side.  Sensation appears grossly intact in the upper thigh. Skin: no rash  Skin: no rash  Pertinent Labs/Radiology:    Latest Ref Rng & Units 08/11/2023    3:30 PM 08/10/2023    4:08 AM 08/09/2023    8:42 PM  CBC  WBC 4.0 - 10.5 K/uL 8.3  9.0  14.4   Hemoglobin 12.0 - 15.0 g/dL 9.2  08.6  57.8   Hematocrit 36.0 - 46.0 % 29.1  34.5  41.8   Platelets 150 - 400 K/uL 223  238  283     Lab Results  Component Value Date   NA 139 08/12/2023   K 3.6 08/12/2023   CL 108 08/12/2023   CO2 23 08/12/2023      Assessment/Plan: Trauma from stove falling on her left thigh Left thigh swelling secondary to compression injury Ortho following-no signs of compartment  syndrome. PT/OT  Bilateral lower extremity weakness (left> right) Pain limits exam-she appears to have some psych issues/chronic dysfunction Unclear if she had lower extremity weakness prior to this hospitalization-but claims she was able to walk around independently.  Unclear if this contributed to her falling. CT/MRI imaging of lumbar/thoracic spine without any fractures or significant injury We will get neurology opinion today.  AKI Essentially resolved Likely due to rhabdo  Rhabdomyolysis Secondary to muscle compression from trauma/stove falling on her left thigh Continue IVF-but decrease rate to 75 cc an hour  Transaminitis Secondary to rhabdo Trend LFTs Continue IVF  Elevated troponins Secondary to rhabdo Echo stable  PROGRESS NOTE        PATIENT DETAILS Name: Elizabeth Schwartz Age: 77 y.o. Sex: female Date of Birth: 1946/02/04 Admit Date: 08/09/2023 Admitting Physician Dorcas Carrow, MD NWG:NFAOZH, Charlcie Cradle, MD  Brief Summary: Patient is a 77 y.o.  female with history of HTN, DM-2, depression/schizoaffective disorder presented with a fall-neighbors found her pinned to the ground with the stove on top of her.  She was subsequently brought to the ED-she was found to have compressive injury to her left thigh along with AKI and rhabdomyolysis.  Significant events: 9/25>> admit to [facility]  Significant studies: 9/25>> CXR: Small pleural effusion. 9/25>> x-ray left femur: Negative for fracture 9/25>> x-ray pelvis:No fracture. 9/25>> CT head: No acute intracranial process. 9/25>> CT C-spine no acute 9/25>> CT chest/abdomen/pelvis: No acute posttraumatic sequelae. 9/27>> CT L-spine: No fracture 9/27 >>CT T-spine: No fracture 9/27>> MRI lumbar/thoracic spine: No acute abnormality.  Significant microbiology data: 9/25>> COVID/influenza/RSV PCR: Negative 9/25>> culture: No growth  Procedures: None  Consults: Orthopedics  Subjective: Awake/alert-some pain in the left thigh area-unable to wiggle toes when asked repeatedly.  Able to somewhat move her right lower extremity.  She has no recollection as to how she got to the hospital and how the stove ended up on top of her left leg.  Objective: Vitals: Blood pressure (!) 130/58, pulse 94, temperature 98.6 F (37 C), temperature source Oral, resp. rate 15, height 5\' 5"  (1.651 m), weight 76.4 kg, SpO2 (!) 83%.   Exam: Gen Exam:Alert awake-not in any distress HEENT:atraumatic, normocephalic Chest: B/L clear to auscultation anteriorly CVS:S1S2 regular Abdomen:soft non tender, non distended Extremities: Mild left thigh swelling persists Neurology: Pain limits exam of left leg-cannot wiggle toes when asked her-if at all-just moved  LLE side-to-side-RLE appears weak-but is able to slowly bend at the level of knee-able to move with side-to-side.  Sensation appears grossly intact in the upper thigh. Skin: no rash  Skin: no rash  Pertinent Labs/Radiology:    Latest Ref Rng & Units 08/11/2023    3:30 PM 08/10/2023    4:08 AM 08/09/2023    8:42 PM  CBC  WBC 4.0 - 10.5 K/uL 8.3  9.0  14.4   Hemoglobin 12.0 - 15.0 g/dL 9.2  08.6  57.8   Hematocrit 36.0 - 46.0 % 29.1  34.5  41.8   Platelets 150 - 400 K/uL 223  238  283     Lab Results  Component Value Date   NA 139 08/12/2023   K 3.6 08/12/2023   CL 108 08/12/2023   CO2 23 08/12/2023      Assessment/Plan: Trauma from stove falling on her left thigh Left thigh swelling secondary to compression injury Ortho following-no signs of compartment  syndrome. PT/OT  Bilateral lower extremity weakness (left> right) Pain limits exam-she appears to have some psych issues/chronic dysfunction Unclear if she had lower extremity weakness prior to this hospitalization-but claims she was able to walk around independently.  Unclear if this contributed to her falling. CT/MRI imaging of lumbar/thoracic spine without any fractures or significant injury We will get neurology opinion today.  AKI Essentially resolved Likely due to rhabdo  Rhabdomyolysis Secondary to muscle compression from trauma/stove falling on her left thigh Continue IVF-but decrease rate to 75 cc an hour  Transaminitis Secondary to rhabdo Trend LFTs Continue IVF  Elevated troponins Secondary to rhabdo Echo stable  PROGRESS NOTE        PATIENT DETAILS Name: Elizabeth Schwartz Age: 77 y.o. Sex: female Date of Birth: 1946/02/04 Admit Date: 08/09/2023 Admitting Physician Dorcas Carrow, MD NWG:NFAOZH, Charlcie Cradle, MD  Brief Summary: Patient is a 77 y.o.  female with history of HTN, DM-2, depression/schizoaffective disorder presented with a fall-neighbors found her pinned to the ground with the stove on top of her.  She was subsequently brought to the ED-she was found to have compressive injury to her left thigh along with AKI and rhabdomyolysis.  Significant events: 9/25>> admit to [facility]  Significant studies: 9/25>> CXR: Small pleural effusion. 9/25>> x-ray left femur: Negative for fracture 9/25>> x-ray pelvis:No fracture. 9/25>> CT head: No acute intracranial process. 9/25>> CT C-spine no acute 9/25>> CT chest/abdomen/pelvis: No acute posttraumatic sequelae. 9/27>> CT L-spine: No fracture 9/27 >>CT T-spine: No fracture 9/27>> MRI lumbar/thoracic spine: No acute abnormality.  Significant microbiology data: 9/25>> COVID/influenza/RSV PCR: Negative 9/25>> culture: No growth  Procedures: None  Consults: Orthopedics  Subjective: Awake/alert-some pain in the left thigh area-unable to wiggle toes when asked repeatedly.  Able to somewhat move her right lower extremity.  She has no recollection as to how she got to the hospital and how the stove ended up on top of her left leg.  Objective: Vitals: Blood pressure (!) 130/58, pulse 94, temperature 98.6 F (37 C), temperature source Oral, resp. rate 15, height 5\' 5"  (1.651 m), weight 76.4 kg, SpO2 (!) 83%.   Exam: Gen Exam:Alert awake-not in any distress HEENT:atraumatic, normocephalic Chest: B/L clear to auscultation anteriorly CVS:S1S2 regular Abdomen:soft non tender, non distended Extremities: Mild left thigh swelling persists Neurology: Pain limits exam of left leg-cannot wiggle toes when asked her-if at all-just moved  LLE side-to-side-RLE appears weak-but is able to slowly bend at the level of knee-able to move with side-to-side.  Sensation appears grossly intact in the upper thigh. Skin: no rash  Skin: no rash  Pertinent Labs/Radiology:    Latest Ref Rng & Units 08/11/2023    3:30 PM 08/10/2023    4:08 AM 08/09/2023    8:42 PM  CBC  WBC 4.0 - 10.5 K/uL 8.3  9.0  14.4   Hemoglobin 12.0 - 15.0 g/dL 9.2  08.6  57.8   Hematocrit 36.0 - 46.0 % 29.1  34.5  41.8   Platelets 150 - 400 K/uL 223  238  283     Lab Results  Component Value Date   NA 139 08/12/2023   K 3.6 08/12/2023   CL 108 08/12/2023   CO2 23 08/12/2023      Assessment/Plan: Trauma from stove falling on her left thigh Left thigh swelling secondary to compression injury Ortho following-no signs of compartment  syndrome. PT/OT  Bilateral lower extremity weakness (left> right) Pain limits exam-she appears to have some psych issues/chronic dysfunction Unclear if she had lower extremity weakness prior to this hospitalization-but claims she was able to walk around independently.  Unclear if this contributed to her falling. CT/MRI imaging of lumbar/thoracic spine without any fractures or significant injury We will get neurology opinion today.  AKI Essentially resolved Likely due to rhabdo  Rhabdomyolysis Secondary to muscle compression from trauma/stove falling on her left thigh Continue IVF-but decrease rate to 75 cc an hour  Transaminitis Secondary to rhabdo Trend LFTs Continue IVF  Elevated troponins Secondary to rhabdo Echo stable  PROGRESS NOTE        PATIENT DETAILS Name: Elizabeth Schwartz Age: 77 y.o. Sex: female Date of Birth: 1946/02/04 Admit Date: 08/09/2023 Admitting Physician Dorcas Carrow, MD NWG:NFAOZH, Charlcie Cradle, MD  Brief Summary: Patient is a 77 y.o.  female with history of HTN, DM-2, depression/schizoaffective disorder presented with a fall-neighbors found her pinned to the ground with the stove on top of her.  She was subsequently brought to the ED-she was found to have compressive injury to her left thigh along with AKI and rhabdomyolysis.  Significant events: 9/25>> admit to [facility]  Significant studies: 9/25>> CXR: Small pleural effusion. 9/25>> x-ray left femur: Negative for fracture 9/25>> x-ray pelvis:No fracture. 9/25>> CT head: No acute intracranial process. 9/25>> CT C-spine no acute 9/25>> CT chest/abdomen/pelvis: No acute posttraumatic sequelae. 9/27>> CT L-spine: No fracture 9/27 >>CT T-spine: No fracture 9/27>> MRI lumbar/thoracic spine: No acute abnormality.  Significant microbiology data: 9/25>> COVID/influenza/RSV PCR: Negative 9/25>> culture: No growth  Procedures: None  Consults: Orthopedics  Subjective: Awake/alert-some pain in the left thigh area-unable to wiggle toes when asked repeatedly.  Able to somewhat move her right lower extremity.  She has no recollection as to how she got to the hospital and how the stove ended up on top of her left leg.  Objective: Vitals: Blood pressure (!) 130/58, pulse 94, temperature 98.6 F (37 C), temperature source Oral, resp. rate 15, height 5\' 5"  (1.651 m), weight 76.4 kg, SpO2 (!) 83%.   Exam: Gen Exam:Alert awake-not in any distress HEENT:atraumatic, normocephalic Chest: B/L clear to auscultation anteriorly CVS:S1S2 regular Abdomen:soft non tender, non distended Extremities: Mild left thigh swelling persists Neurology: Pain limits exam of left leg-cannot wiggle toes when asked her-if at all-just moved  LLE side-to-side-RLE appears weak-but is able to slowly bend at the level of knee-able to move with side-to-side.  Sensation appears grossly intact in the upper thigh. Skin: no rash  Skin: no rash  Pertinent Labs/Radiology:    Latest Ref Rng & Units 08/11/2023    3:30 PM 08/10/2023    4:08 AM 08/09/2023    8:42 PM  CBC  WBC 4.0 - 10.5 K/uL 8.3  9.0  14.4   Hemoglobin 12.0 - 15.0 g/dL 9.2  08.6  57.8   Hematocrit 36.0 - 46.0 % 29.1  34.5  41.8   Platelets 150 - 400 K/uL 223  238  283     Lab Results  Component Value Date   NA 139 08/12/2023   K 3.6 08/12/2023   CL 108 08/12/2023   CO2 23 08/12/2023      Assessment/Plan: Trauma from stove falling on her left thigh Left thigh swelling secondary to compression injury Ortho following-no signs of compartment  syndrome. PT/OT  Bilateral lower extremity weakness (left> right) Pain limits exam-she appears to have some psych issues/chronic dysfunction Unclear if she had lower extremity weakness prior to this hospitalization-but claims she was able to walk around independently.  Unclear if this contributed to her falling. CT/MRI imaging of lumbar/thoracic spine without any fractures or significant injury We will get neurology opinion today.  AKI Essentially resolved Likely due to rhabdo  Rhabdomyolysis Secondary to muscle compression from trauma/stove falling on her left thigh Continue IVF-but decrease rate to 75 cc an hour  Transaminitis Secondary to rhabdo Trend LFTs Continue IVF  Elevated troponins Secondary to rhabdo Echo stable

## 2023-08-12 NOTE — Plan of Care (Signed)

## 2023-08-13 DIAGNOSIS — M6282 Rhabdomyolysis: Secondary | ICD-10-CM | POA: Diagnosis not present

## 2023-08-13 DIAGNOSIS — G309 Alzheimer's disease, unspecified: Secondary | ICD-10-CM | POA: Diagnosis not present

## 2023-08-13 DIAGNOSIS — N179 Acute kidney failure, unspecified: Secondary | ICD-10-CM | POA: Diagnosis not present

## 2023-08-13 DIAGNOSIS — R7989 Other specified abnormal findings of blood chemistry: Secondary | ICD-10-CM | POA: Diagnosis not present

## 2023-08-13 LAB — RPR: RPR Ser Ql: NONREACTIVE

## 2023-08-13 LAB — GLUCOSE, CAPILLARY
Glucose-Capillary: 148 mg/dL — ABNORMAL HIGH (ref 70–99)
Glucose-Capillary: 143 mg/dL — ABNORMAL HIGH (ref 70–99)
Glucose-Capillary: 135 mg/dL — ABNORMAL HIGH (ref 70–99)
Glucose-Capillary: 162 mg/dL — ABNORMAL HIGH (ref 70–99)

## 2023-08-13 LAB — COMPREHENSIVE METABOLIC PANEL
ALT: 63 U/L — ABNORMAL HIGH (ref 0–44)
AST: 105 U/L — ABNORMAL HIGH (ref 15–41)
Albumin: 1.9 g/dL — ABNORMAL LOW (ref 3.5–5.0)
Alkaline Phosphatase: 71 U/L (ref 38–126)
Anion gap: 11 (ref 5–15)
BUN: 21 mg/dL (ref 8–23)
CO2: 21 mmol/L — ABNORMAL LOW (ref 22–32)
Calcium: 7.5 mg/dL — ABNORMAL LOW (ref 8.9–10.3)
Chloride: 106 mmol/L (ref 98–111)
Creatinine, Ser: 0.9 mg/dL (ref 0.44–1.00)
GFR, Estimated: >60 mL/min (ref >60)
Glucose, Bld: 144 mg/dL — ABNORMAL HIGH (ref 70–99)
Potassium: 4 mmol/L (ref 3.5–5.1)
Sodium: 138 mmol/L (ref 135–145)
Total Bilirubin: 0.5 mg/dL (ref 0.3–1.2)
Total Protein: 4.8 g/dL — ABNORMAL LOW (ref 6.5–8.1)

## 2023-08-13 LAB — CK: Total CK: 5560 U/L — ABNORMAL HIGH (ref 38–234)

## 2023-08-13 MED ORDER — SODIUM CHLORIDE 0.9 % IV SOLN: INTRAVENOUS | Status: DC

## 2023-08-13 MED ORDER — CHLORHEXIDINE GLUCONATE CLOTH 2 % EX PADS
6.0000 | MEDICATED_PAD | Freq: Every day | CUTANEOUS | Status: DC
  Administered 2023-08-13 – 2023-08-18 (×6): 6 via TOPICAL

## 2023-08-13 NOTE — Progress Notes (Signed)
PROGRESS NOTE        PATIENT DETAILS Name: Elizabeth Schwartz Age: 77 y.o. Sex: female Date of Birth: Nov 12, 1946 Admit Date: 08/09/2023 Admitting Physician Dorcas Carrow, MD [id number], Charlcie Cradle, MD  Brief Summary: Patient is a 77 y.o.  female with history of HTN, DM-2, depression/schizoaffective disorder presented with a fall-neighbors found her pinned to the ground with the stove on top of her.  She was subsequently brought to the ED-she was found to have compressive injury to her left thigh along with AKI and rhabdomyolysis.  Significant events: 9/25>> admit to [facility]  Significant studies: 9/25>> CXR: Small pleural effusion. 9/25>> x-ray left femur: Negative for fracture 9/25>> x-ray pelvis:No fracture. 9/25>> CT head: No acute intracranial process. 9/25>> CT C-spine no acute 9/25>> CT chest/abdomen/pelvis: No acute posttraumatic sequelae. 9/27>> CT L-spine: No fracture 9/27 >>CT T-spine: No fracture 9/27>> MRI lumbar/thoracic spine: No acute abnormality.  Significant microbiology data: 9/25>> COVID/influenza/RSV PCR: Negative 9/25>> culture: No growth  Procedures: None  Consults: Orthopedics Neurology  Subjective: Sleeping-minimally confused-when awake-moving RLE-able to move (just about) LLE when distracted.  Objective: Vitals: Blood pressure (!) 142/60, pulse 94, temperature 98 F (36.7 C), temperature source Oral, resp. rate 18, height 5\' 5"  (1.651 m), weight 76.4 kg, SpO2 95%.   Exam: Gen Exam:Alert awake-not in any distress HEENT:atraumatic, normocephalic Chest: B/L clear to auscultation anteriorly CVS:S1S2 regular Abdomen:soft non tender, non distended Extremities:no edema Neurology: Moving RLE  more freely today-LLE exam is limited by pain but able to move with side-by-side. Skin: no rash  Skin: no rash  Pertinent Labs/Radiology:    Latest Ref Rng & Units 08/12/2023   11:57 AM 08/11/2023    3:30 PM 08/10/2023    4:08 AM  CBC   WBC 4.0 - 10.5 K/uL 7.1  8.3  9.0   Hemoglobin 12.0 - 15.0 g/dL 8.7  9.2  28.4   Hematocrit 36.0 - 46.0 % 28.1  29.1  34.5   Platelets 150 - 400 K/uL 221  223  238     Lab Results  Component Value Date   NA 139 08/12/2023   K 3.6 08/12/2023   CL 108 08/12/2023   CO2 23 08/12/2023      Assessment/Plan: Trauma from stove falling on her left thigh Left thigh swelling secondary to compression injury Ortho following-no signs of compartment  syndrome. PT/OT  Bilateral lower extremity weakness (left> right) Difficult exam due to cognitive issues/pain-but suspect this is more of an issue with rhabdomyolysis pain and cognitive issue rather than any underlying logic issue. Appreciate neurology input on 9/28-no further workup recommended Imaging as above Continue to work with PT/OT  AKI Essentially resolved Likely due to rhabdo  Rhabdomyolysis Secondary to muscle compression from trauma/stove falling on her left thigh Continue IVF-but decrease rate to 75 cc an hour Morning labs still not done-pending.  Transaminitis Secondary to rhabdo Trend LFTs Continue IVF Morning labs still not done-pending.  Elevated troponins Secondary to rhabdo Echo stable per cardiology  PNA Afebrile Rocephin x 5 days-Zithromax x 3 days  HLD Holding Zetia/fenofibrate until rhabdomyolysis improves  DM-2 (A1c 6.1 on 8/29) CBG stable SSI  Recent Labs    08/12/23 1608 08/12/23 2047 08/13/23 0828  GLUCAP 179* 138* 148*    Dementia Mood disorder ?  Cognitive dysfunction at baseline Continue clozapine/Aricept/Namenda  PT/OT/TOC eval-SNF plan.   BMI: Estimated body  mass index is 28.03 kg/m as calculated from the following:   Height as of this encounter: 5\' 5"  (1.651 m).   Weight as of this encounter: 76.4 kg.   Code status:   Code Status: Full Code   DVT Prophylaxis: enoxaparin (LOVENOX) injection 40 mg Start: 08/10/23 1400    Family Communication: Niece [name]-[phone]-was  contacted over the phone-however bad connection-and she hung up.   Disposition Plan: Status is: Inpatient Remains inpatient appropriate because: Severity of illness   Planned Discharge Destination:Skilled nursing facility   Diet: Diet Order             Diet Heart Room service appropriate? Yes; Fluid consistency: Thin  Diet effective now                     Antimicrobial agents: Anti-infectives (From admission, onward)    Start     Dose/Rate Route Frequency Ordered Stop   08/10/23 0300  azithromycin (ZITHROMAX) 500 mg in sodium chloride 0.9 % 250 mL IVPB  Status:  Discontinued        500 mg 250 mL/hr over 60 Minutes Intravenous Every 24 hours 08/10/23 0208 08/12/23 1047   08/09/23 2000  cefTRIAXone (ROCEPHIN) 2 g in sodium chloride 0.9 % 100 mL IVPB        2 g 200 mL/hr over 30 Minutes Intravenous Every 24 hours 08/09/23 1949 08/14/23 1959        MEDICATIONS: Scheduled Meds:  Chlorhexidine Gluconate Cloth  6 each Topical Daily   clozapine  200 mg Oral QHS   donepezil  10 mg Oral QHS   enoxaparin (LOVENOX) injection  40 mg Subcutaneous Q24H   insulin aspart  0-9 Units Subcutaneous TID WC   memantine  5 mg Oral BID   Continuous Infusions:  cefTRIAXone (ROCEPHIN)  IV 2 g (08/12/23 2230)   PRN Meds:.acetaminophen **OR** acetaminophen, ondansetron **OR** ondansetron (ZOFRAN) IV   I have personally reviewed following labs and imaging studies  LABORATORY DATA: CBC: Recent Labs  Lab 08/09/23 2042 08/10/23 0408 08/11/23 1530 08/12/23 1157  WBC 14.4* 9.0 8.3 7.1  NEUTROABS 12.1*  --  5.7  --   HGB 13.0 11.0* 9.2* 8.7*  HCT 41.8 34.5* 29.1* 28.1*  MCV 95.0 95.3 96.0 94.3  PLT 283 238 223 221    Basic Metabolic Panel: Recent Labs  Lab 08/09/23 2042 08/10/23 0408 08/11/23 1530 08/12/23 0627  NA 142 141 141 139  K 4.1 3.8 3.5 3.6  CL 106 107 107 108  CO2 18* 24 24 23   GLUCOSE 150* 175* 126* 150*  BUN 43* 37* 35* 30*  CREATININE 1.40* 1.28* 1.05*  1.01*  CALCIUM 8.7* 7.9* 7.4* 7.3*  MG  --   --  1.7  --     GFR: Estimated Creatinine Clearance: 47.7 mL/min (A) (by C-G formula based on SCr of 1.01 mg/dL (H)).  Liver Function Tests: Recent Labs  Lab 08/09/23 2042 08/10/23 0408 08/11/23 1530 08/12/23 0627  AST 368* 316* 195* 160*  ALT 115* 94* 77* 66*  ALKPHOS 53 44 49 60  BILITOT 1.1 0.8 0.3 0.4  PROT 7.5 5.9* 5.3* 4.8*  ALBUMIN 3.8 2.9* 2.3* 2.0*   No results for input(s): "LIPASE", "AMYLASE" in the last 168 hours. No results for input(s): "AMMONIA" in the last 168 hours.  Coagulation Profile: Recent Labs  Lab 08/09/23 2042  INR 1.1    Cardiac Enzymes: Recent Labs  Lab 08/09/23 2042 08/10/23 0408 08/10/23 1804 08/11/23 1530  CKTOTAL  19,249* 26,240* 19,727* 13,813*    BNP (last 3 results) No results for input(s): "PROBNP" in the last 8760 hours.  Lipid Profile: No results for input(s): "CHOL", "HDL", "LDLCALC", "TRIG", "CHOLHDL", "LDLDIRECT" in the last 72 hours.  Thyroid Function Tests: Recent Labs    08/12/23 1157  TSH 2.990    Anemia Panel: Recent Labs    08/12/23 1157  VITAMINB12 358  FOLATE 10.0    Urine analysis:    Component Value Date/Time   COLORURINE YELLOW 08/09/2023 2042   APPEARANCEUR CLEAR 08/09/2023 2042   LABSPEC 1.018 08/09/2023 2042   PHURINE 5.0 08/09/2023 2042   GLUCOSEU NEGATIVE 08/09/2023 2042   HGBUR LARGE (A) 08/09/2023 2042   BILIRUBINUR NEGATIVE 08/09/2023 2042   KETONESUR 5 (A) 08/09/2023 2042   PROTEINUR 30 (A) 08/09/2023 2042   UROBILINOGEN 1.0 09/03/2009 1956   NITRITE NEGATIVE 08/09/2023 2042   LEUKOCYTESUR NEGATIVE 08/09/2023 2042    Sepsis Labs: Lactic Acid, Venous    Component Value Date/Time   LATICACIDVEN 2.0 (HH) 08/10/2023 0416    MICROBIOLOGY: Recent Results (from the past 240 hour(s))  Resp panel by RT-PCR (RSV, Flu A&B, Covid) Urine, Catheterized     Status: None   Collection Time: 08/09/23  8:42 PM   Specimen: Urine, Catheterized;  Nasal Swab  Result Value Ref Range Status   SARS Coronavirus 2 by RT PCR NEGATIVE NEGATIVE Final   Influenza A by PCR NEGATIVE NEGATIVE Final   Influenza B by PCR NEGATIVE NEGATIVE Final    Comment: (NOTE) The Xpert Xpress SARS-CoV-2/FLU/RSV plus assay is intended as an aid in the diagnosis of influenza from Nasopharyngeal swab specimens and should not be used as a sole basis for treatment. Nasal washings and aspirates are unacceptable for Xpert Xpress SARS-CoV-2/FLU/RSV testing.  Fact Sheet for Patients: BloggerCourse.com  Fact Sheet for Healthcare Providers: SeriousBroker.it  This test is not yet approved or cleared by the [place] FDA and has been authorized for detection and/or diagnosis of SARS-CoV-2 by FDA under an Emergency Use Authorization (EUA). This EUA will remain in effect (meaning this test can be used) for the duration of the COVID-19 declaration under Section 564(b)(1) of the Act, 21 U.S.C. section 360bbb-3(b)(1), unless the authorization is terminated or revoked.     Resp Syncytial Virus by PCR NEGATIVE NEGATIVE Final    Comment: (NOTE) Fact Sheet for Patients: BloggerCourse.com  Fact Sheet for Healthcare Providers: SeriousBroker.it  This test is not yet approved or cleared by the [place] FDA and has been authorized for detection and/or diagnosis of SARS-CoV-2 by FDA under an Emergency Use Authorization (EUA). This EUA will remain in effect (meaning this test can be used) for the duration of the COVID-19 declaration under Section 564(b)(1) of the Act, 21 U.S.C. section 360bbb-3(b)(1), unless the authorization is terminated or revoked.  Performed at [facility], [address] [place], [place] [address]   Blood Culture (routine x 2)     Status: None (Preliminary result)   Collection Time: 08/09/23  8:42 PM   Specimen: BLOOD RIGHT HAND   Result Value Ref Range Status   Specimen Description BLOOD RIGHT HAND  Final   Special Requests   Final    BOTTLES DRAWN AEROBIC AND ANAEROBIC Blood Culture adequate volume   Culture   Final    NO GROWTH 4 DAYS Performed at [facility], [address], [place], [place] [address]    Report Status PENDING  Incomplete  Blood Culture (routine x 2)  Status: None (Preliminary result)   Collection Time: 08/09/23  8:42 PM   Specimen: BLOOD LEFT HAND  Result Value Ref Range Status   Specimen Description BLOOD LEFT HAND  Final   Special Requests   Final    BOTTLES DRAWN AEROBIC AND ANAEROBIC Blood Culture adequate volume   Culture   Final    NO GROWTH 4 DAYS Performed at [facility], [address] [place], [place] [address]    Report Status PENDING  Incomplete    RADIOLOGY STUDIES/RESULTS: MR LUMBAR SPINE WO CONTRAST  Result Date: 08/11/2023 CLINICAL DATA:  Fall.  Leg weakness. EXAM: MRI THORACIC AND LUMBAR SPINE WITHOUT CONTRAST TECHNIQUE: Multiplanar and multiecho pulse sequences of the thoracic and lumbar spine were obtained without intravenous contrast. COMPARISON:  None Available. FINDINGS: MRI THORACIC SPINE FINDINGS Alignment:  Physiologic. Vertebrae: No fracture, evidence of discitis, or bone lesion. Cord:  Normal signal and morphology. Paraspinal and other soft tissues: Negative. Disc levels: There is no spinal canal stenosis or neural foraminal stenosis. No disc herniation. MRI LUMBAR SPINE FINDINGS Segmentation:  Standard. Alignment:  Physiologic. Vertebrae: No fracture, evidence of discitis, or bone lesion. L1 hemangioma Conus medullaris and cauda equina: Conus extends to the L1-2 level. Conus and cauda equina appear normal. Paraspinal and other soft tissues: Negative Disc levels: L1-L2: Normal disc space and facet joints. No spinal canal stenosis. No neural foraminal stenosis. L2-L3: Normal disc space and facet joints. No spinal canal stenosis. No neural foraminal  stenosis. L3-L4: Normal disc space and facet joints. No spinal canal stenosis. No neural foraminal stenosis. L4-L5: Minimal disc bulge. No spinal canal stenosis. No neural foraminal stenosis. L5-S1: Mild facet arthrosis. No disc herniation. No spinal canal stenosis. No neural foraminal stenosis. Visualized sacrum: Normal. IMPRESSION: 1. No acute abnormality of the thoracic or lumbar spine. 2. Mild lower lumbar degenerative disc disease without spinal canal or neural foraminal stenosis. Electronically Signed   By: Deatra Robinson M.D.   On: 08/11/2023 23:55   MR THORACIC SPINE WO CONTRAST  Result Date: 08/11/2023 CLINICAL DATA:  Fall.  Leg weakness. EXAM: MRI THORACIC AND LUMBAR SPINE WITHOUT CONTRAST TECHNIQUE: Multiplanar and multiecho pulse sequences of the thoracic and lumbar spine were obtained without intravenous contrast. COMPARISON:  None Available. FINDINGS: MRI THORACIC SPINE FINDINGS Alignment:  Physiologic. Vertebrae: No fracture, evidence of discitis, or bone lesion. Cord:  Normal signal and morphology. Paraspinal and other soft tissues: Negative. Disc levels: There is no spinal canal stenosis or neural foraminal stenosis. No disc herniation. MRI LUMBAR SPINE FINDINGS Segmentation:  Standard. Alignment:  Physiologic. Vertebrae: No fracture, evidence of discitis, or bone lesion. L1 hemangioma Conus medullaris and cauda equina: Conus extends to the L1-2 level. Conus and cauda equina appear normal. Paraspinal and other soft tissues: Negative Disc levels: L1-L2: Normal disc space and facet joints. No spinal canal stenosis. No neural foraminal stenosis. L2-L3: Normal disc space and facet joints. No spinal canal stenosis. No neural foraminal stenosis. L3-L4: Normal disc space and facet joints. No spinal canal stenosis. No neural foraminal stenosis. L4-L5: Minimal disc bulge. No spinal canal stenosis. No neural foraminal stenosis. L5-S1: Mild facet arthrosis. No disc herniation. No spinal canal stenosis. No  neural foraminal stenosis. Visualized sacrum: Normal. IMPRESSION: 1. No acute abnormality of the thoracic or lumbar spine. 2. Mild lower lumbar degenerative disc disease without spinal canal or neural foraminal stenosis. Electronically Signed   By: Deatra Robinson M.D.   On: 08/11/2023 23:55   CT THORACIC SPINE WO CONTRAST  Result Date: 08/11/2023 CLINICAL DATA:  Mid-back pain, neuro deficit. Bilateral lower extremity weakness. Trauma. EXAM: CT THORACIC SPINE WITHOUT CONTRAST TECHNIQUE: Multidetector CT images of the thoracic were obtained using the standard protocol without intravenous contrast. RADIATION DOSE REDUCTION: This exam was performed according to the departmental dose-optimization program which includes automated exposure control, adjustment of the mA and/or kV according to patient size and/or use of iterative reconstruction technique. COMPARISON:  CT cervical spine and CT chest, abdomen, and pelvis 08/09/2023 FINDINGS: Alignment: Normal. Vertebrae: No acute fracture or suspicious osseous lesion. Prominent anterior vertebral osteophytes from T3-T11, including solidly fused osteophytes at T4-5, T8-9, and T9-10. Paraspinal and other soft tissues: Aortic and coronary atherosclerosis. Limited assessment of the lungs due to respiratory motion artifact. Dependent atelectasis in the left lower lobe. Disc levels: No evidence of high-grade spinal canal or neural foraminal stenosis in the thoracic spine. Partially visualized spondylosis in the cervical spine, more fully evaluated on the recent dedicated cervical spine CT. IMPRESSION: 1. No acute osseous abnormality identified in the thoracic spine. 2. Thoracic spondylosis without high-grade stenosis. 3.  Aortic Atherosclerosis (ICD10-I70.0). Electronically Signed   By: Sebastian Ache M.D.   On: 08/11/2023 14:16   CT LUMBAR SPINE WO CONTRAST  Result Date: 08/11/2023 CLINICAL DATA:  Trauma. Status post fall. Bilateral lower extremity weakness. EXAM: CT LUMBAR  SPINE WITHOUT CONTRAST TECHNIQUE: Multidetector CT imaging of the lumbar spine was performed without intravenous contrast administration. Multiplanar CT image reconstructions were also generated. RADIATION DOSE REDUCTION: This exam was performed according to the departmental dose-optimization program which includes automated exposure control, adjustment of the mA and/or kV according to patient size and/or use of iterative reconstruction technique. COMPARISON:  08/09/2023 FINDINGS: Segmentation: 5 lumbar type vertebrae. Alignment: Normal. Vertebrae: No acute fracture or focal pathologic process. Paraspinal and other soft tissues: Negative. Disc levels: The disc spaces are relatively well preserved. Mild multi level ventral endplate spurring is noted. IMPRESSION: 1. No evidence for lumbar spine fracture or subluxation. 2. Mild multilevel ventral endplate spurring. Electronically Signed   By: Signa Kell M.D.   On: 08/11/2023 13:02     LOS: 3 days   Jeoffrey Massed, MD  [facility]    To contact the attending provider between 7A-7P or the covering provider during after hours 7P-7A, please log into the web site [email or web address] and access using universal [place] password for that web site. If you do not have the password, please call the hospital operator.  08/13/2023, 11:43 AM

## 2023-08-13 NOTE — Plan of Care (Signed)

## 2023-08-14 DIAGNOSIS — R7989 Other specified abnormal findings of blood chemistry: Secondary | ICD-10-CM | POA: Diagnosis not present

## 2023-08-14 DIAGNOSIS — G309 Alzheimer's disease, unspecified: Secondary | ICD-10-CM | POA: Diagnosis not present

## 2023-08-14 DIAGNOSIS — N179 Acute kidney failure, unspecified: Secondary | ICD-10-CM | POA: Diagnosis not present

## 2023-08-14 DIAGNOSIS — M6282 Rhabdomyolysis: Secondary | ICD-10-CM | POA: Diagnosis not present

## 2023-08-14 LAB — GLUCOSE, CAPILLARY
Glucose-Capillary: 168 mg/dL — ABNORMAL HIGH (ref 70–99)
Glucose-Capillary: 136 mg/dL — ABNORMAL HIGH (ref 70–99)
Glucose-Capillary: 181 mg/dL — ABNORMAL HIGH (ref 70–99)
Glucose-Capillary: 172 mg/dL — ABNORMAL HIGH (ref 70–99)
Glucose-Capillary: 132 mg/dL — ABNORMAL HIGH (ref 70–99)

## 2023-08-14 LAB — COMPREHENSIVE METABOLIC PANEL
ALT: 59 U/L — ABNORMAL HIGH (ref 0–44)
AST: 87 U/L — ABNORMAL HIGH (ref 15–41)
Albumin: 2 g/dL — ABNORMAL LOW (ref 3.5–5.0)
Alkaline Phosphatase: 79 U/L (ref 38–126)
Anion gap: 9 (ref 5–15)
BUN: 16 mg/dL (ref 8–23)
CO2: 23 mmol/L (ref 22–32)
Calcium: 7.7 mg/dL — ABNORMAL LOW (ref 8.9–10.3)
Chloride: 107 mmol/L (ref 98–111)
Creatinine, Ser: 1.11 mg/dL — ABNORMAL HIGH (ref 0.44–1.00)
GFR, Estimated: 51 mL/min — ABNORMAL LOW (ref >60)
Glucose, Bld: 155 mg/dL — ABNORMAL HIGH (ref 70–99)
Potassium: 3.5 mmol/L (ref 3.5–5.1)
Sodium: 139 mmol/L (ref 135–145)
Total Bilirubin: 0.4 mg/dL (ref 0.3–1.2)
Total Protein: 5.1 g/dL — ABNORMAL LOW (ref 6.5–8.1)

## 2023-08-14 LAB — CULTURE, BLOOD (ROUTINE X 2)
Culture: NO GROWTH
Culture: NO GROWTH

## 2023-08-14 LAB — CK: Total CK: 3979 U/L — ABNORMAL HIGH (ref 38–234)

## 2023-08-14 MED ORDER — TAMSULOSIN HCL 0.4 MG PO CAPS
0.4000 mg | ORAL_CAPSULE | Freq: Every day | ORAL | Status: DC
  Administered 2023-08-14 – 2023-08-18 (×5): 0.4 mg via ORAL
  Filled 2023-08-14 (×5): qty 1

## 2023-08-14 NOTE — TOC Progression Note (Signed)
Transition of Care [facility]) - Progression Note    Patient Details  Name: Elizabeth Schwartz MRN: [id number] Date of Birth: June 02, 1946  [facility] of [facility]) CM/SW Contact  Mearl Latin, LCSW Phone Number: 08/14/2023, 4:28 PM  Clinical Narrative:    CSW received return call from patient's niece. She stated that she is the only family for patient as her son died when he was little. Patient has her listed (and [name]'s two children) as her HCPOA. She stated patient is very private and does not share much info with family and changes her number every week and can be paranoid. CSW explained therapy's recommendations for SNF and how Bonita Quin may need to help with that process. Bonita Quin stated she can help but cannot sign anything stating she is financially responsible for patient. She stated a county Child psychotherapist once asked her why she could not transport patient to her appointments and Bonita Quin stated that she has to work so cannot be available to the patient like that. CSW explained that if patient ends up requiring long term needs in the future and Bonita Quin does not feel capable of taking on that responsibility then the state may need to be contacted to take Guardianship of the patient if she does not improve cognitively. Bonita Quin reported understanding. CSW will send out SNF referrals and provide bed offers. Pasrr is pending.    Expected Discharge Plan: Skilled Nursing Facility Barriers to Discharge: Continued Medical Work up, English as a second language teacher  Expected Discharge Plan and Services In-house Referral: Clinical Social Work   Post Acute Care Choice: Skilled Nursing Facility Living arrangements for the past 2 months: Apartment                                       Social Determinants of Health (SDOH) Interventions SDOH Screenings   Food Insecurity: No Food Insecurity (03/22/2023)  Housing: Patient Unable To Answer (03/22/2023)  Transportation Needs: No Transportation Needs (03/22/2023)  Utilities: Not  At Risk (03/22/2023)  Alcohol Screen: Low Risk  (03/22/2023)  Depression (PHQ2-9): Low Risk  (07/13/2023)  Financial Resource Strain: Low Risk  (03/22/2023)  Physical Activity: Insufficiently Active (03/22/2023)  Social Connections: Socially Isolated (03/22/2023)  Stress: No Stress Concern Present (03/22/2023)  Tobacco Use: Medium Risk (08/09/2023)    Readmission Risk Interventions     No data to display

## 2023-08-14 NOTE — Progress Notes (Signed)
Physical Therapy Treatment Patient Details Name: Elizabeth Schwartz MRN: [id number] DOB: 1946-10-09 Today's Date: 08/14/2023   History of Present Illness Pt is a 77 y/o F admitted on 08/09/23. EMS was called for welfare check & pt was found on the ground pinned underneath the stove door, pt was confused. Pt is being treated for acute rhabdomyolysis & L thigh injury. PMH: dementia, HTN, schizoaffective disorder, DM2    PT Comments  Patient continues to require +2 assist for bed mobility. Requires incr time for processing cues as provided and frequently needs repetition. May try stedy for standing next visit as believe she can get her feet and knees close enough together to try.     If plan is discharge home, recommend the following: Two people to help with walking and/or transfers;Two people to help with bathing/dressing/bathroom;Direct supervision/assist for medications management;Help with stairs or ramp for entrance;Assist for transportation;Assistance with feeding;Assistance with cooking/housework;Direct supervision/assist for financial management   Can travel by private vehicle     No  Equipment Recommendations  Wheelchair (measurements PT);Wheelchair cushion (measurements PT);Hoyer lift    Recommendations for [employer]       Precautions / Restrictions Precautions Precautions: Fall Restrictions Weight Bearing Restrictions: No     Mobility  Bed Mobility Overal bed mobility: Needs Assistance Bed Mobility: Rolling, Sidelying to Sit, Sit to Sidelying Rolling: Mod assist, Used rails Sidelying to sit: Max assist, +2 for physical assistance, HOB elevated, Used rails     Sit to sidelying: Mod assist, +2 for physical assistance, Used rails General bed mobility comments: required heavy cues for sequencing/technique; rolling rt and lt for pad placement; assist to move LEs over EOB (and then back up onto bed) and to raise/lower torso    Transfers                   General  transfer comment: ?next visit can try stedy as pt able to bring feet/knees close enough together to try    Ambulation/Gait                   Stairs             Wheelchair Mobility     Tilt Bed    Modified Rankin (Stroke Patients Only)       Balance Overall balance assessment: Needs assistance Sitting-balance support: Feet supported, Bilateral upper extremity supported Sitting balance-Leahy Scale: Poor   Postural control: Posterior lean                                  Cognition Arousal: Alert Behavior During Therapy: WFL for tasks assessed/performed Overall Cognitive Status: No family/caregiver present to determine baseline cognitive functioning                                 General Comments: comments/conversation more appropriate; incr time for processing cues with some repetition necessary        Exercises    General Comments General comments (skin integrity, edema, etc.): VSS on RA; unable to use either UE for BP; RLE BP 154/87 in sitting      Pertinent Vitals/Pain Pain Assessment Pain Assessment: Faces Faces Pain Scale: Hurts little more Pain Location: LUE and LE Pain Descriptors / Indicators: Aching, Sore Pain Intervention(s): Limited activity within patient's tolerance, Monitored during session    Home Living Family/patient expects to be  discharged to:: Private residence                 Home Equipment: Gilmer Mor - single point Additional Comments: Per chart, pt from her own private residence, pt reports an independent living apartment that's a part of a community but unable to elaborate. Pt notes she lives alone.    Prior Function            PT Goals (current goals can now be found in the care plan section) Acute Rehab PT Goals Patient Stated Goal: get stronger Time For Goal Achievement: 08/25/23 Potential to Achieve Goals: Fair Progress towards PT goals: Progressing toward goals    Frequency     Min 1X/week      PT Plan      Co-evaluation              AM-PAC PT "6 Clicks" Mobility   Outcome Measure  Help needed turning from your back to your side while in a flat bed without using bedrails?: A Lot Help needed moving from lying on your back to sitting on the side of a flat bed without using bedrails?: Total Help needed moving to and from a bed to a chair (including a wheelchair)?: Total Help needed standing up from a chair using your arms (e.g., wheelchair or bedside chair)?: Total Help needed to walk in hospital room?: Total Help needed climbing 3-5 steps with a railing? : Total 6 Click Score: 7    End of Session   Activity Tolerance: Patient limited by fatigue Patient left: in bed;with call bell/phone within reach;with bed alarm set Nurse Communication: Mobility status PT Visit Diagnosis: Difficulty in walking, not elsewhere classified (R26.2);Muscle weakness (generalized) (M62.81);Unsteadiness on feet (R26.81);Other abnormalities of gait and mobility (R26.89)     Time: 1450-1511 PT Time Calculation (min) (ACUTE ONLY): 21 min  Charges:    $Therapeutic Exercise: 8-22 mins $Therapeutic Activity: 8-22 mins PT General Charges $$ ACUTE PT VISIT: 1 Visit                      Jerolyn Center, PT Acute Rehabilitation Services  Office [phone]    Zena Amos 08/14/2023, 3:35 PM

## 2023-08-14 NOTE — Progress Notes (Signed)
RE: Elizabeth Schwartz Date of Birth: 03/26/2046 Date: 08/14/23  Please be advised that the above-named patient has a primary diagnosis of dementia which supersedes any psychiatric diagnosis. Patient will require a short-term nursing home stay - anticipated 30 days or less for rehabilitation and strengthening.  The plan is for return home.

## 2023-08-14 NOTE — [place] FL2 (Signed)
[place] MEDICAID FL2 LEVEL OF CARE FORM     IDENTIFICATION  Patient Name: Elizabeth Schwartz Birthdate: [birth year] Sex: female Admission Date (Current Location): 08/09/2023  [facility] and [place] Number:  Producer, television/film/video and Address:  [facility] [place], [address], [place], [place] [address]      Provider Number: [id number]  Attending Physician Name and Address:  Maretta Bees, MD  Relative Name and Phone Number:       Current Level of Care: Hospital Recommended Level of Care: Skilled Nursing Facility Prior Approval Number:    Date Approved/Denied:   PASRR Number: pending  Discharge Plan: SNF    Current Diagnoses: Patient Active Problem List   Diagnosis Date Noted   Rhabdomyolysis 08/10/2023   AKI (acute kidney injury) (HCC) 08/10/2023   Transaminitis 08/10/2023   CAP (community acquired pneumonia) 08/10/2023   Lactic acidosis 08/10/2023   Elevated troponin 08/10/2023   Pressure injury of skin with suspected deep tissue injury 08/10/2023   Porokeratosis 01/02/2023   Dementia due to Alzheimer's disease (HCC) 08/11/2022   Statin myopathy 04/21/2022   Pain due to onychomycosis of toenails of both feet 02/22/2022   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) 11/22/2021   Chronic pain of right hip 09/15/2021   Chronic pain of left knee 09/15/2021   Chronic left shoulder pain 05/24/2021   Mixed hyperlipidemia 03/22/2021   Pincer nail deformity 11/03/2020   Diabetes type 2, controlled (HCC) 06/11/2019   Schizophrenia (HCC) 03/20/2019   Chronic diarrhea 03/20/2019   Hypertension 08/22/2016   Anemia 08/22/2016   Major neurocognitive disorder due to multiple etiologies with behavioral disturbance (HCC) 08/22/2016   Osteoporosis 08/22/2016   Asthma, moderate persistent 09/08/2011   Diverticulosis 09/08/2011   Former smoker 09/08/2011   GERD 06/04/2007    Orientation RESPIRATION BLADDER Height & Weight     Self, Place, Situation   Normal Incontinent, Indwelling catheter Weight: 168 lb 6.9 oz (76.4 kg) Height:  5\' 5"  (165.1 cm)  BEHAVIORAL SYMPTOMS/MOOD NEUROLOGICAL BOWEL NUTRITION STATUS      Incontinent Diet (See dc summary)  AMBULATORY STATUS COMMUNICATION OF NEEDS Skin   Extensive Assist Verbally Other (Comment) (non pressure wounds; shoulder; wound on hip)                       Personal Care Assistance Level of Assistance  Bathing, Feeding, Dressing Bathing Assistance: Maximum assistance Feeding assistance: Limited assistance Dressing Assistance: Limited assistance     Functional Limitations Info             SPECIAL CARE FACTORS FREQUENCY  PT (By licensed PT), OT (By licensed OT)     PT Frequency: 5x/week OT Frequency: 5x/week            Contractures Contractures Info: Not present    Additional Factors Info  Code Status, Allergies, Insulin Sliding Scale Code Status Info: Full Allergies Info: Crestor (Rosuvastatin)   Insulin Sliding Scale Info: See dc summary       Current Medications (08/14/2023):  This is the current hospital active medication list Current Facility-Administered Medications  Medication Dose Route Frequency Provider Last Rate Last Admin   0.9 %  sodium chloride infusion   Intravenous Continuous Maretta Bees, MD 50 mL/hr at 08/14/23 1051 Rate Change at 08/14/23 1051   acetaminophen (TYLENOL) tablet 650 mg  650 mg Oral Q6H PRN Hillary Bow, DO   650 mg at 08/14/23 4132   Or  acetaminophen (TYLENOL) suppository 650 mg  650 mg Rectal Q6H PRN Hillary Bow, DO       Chlorhexidine Gluconate Cloth 2 % PADS 6 each  6 each Topical Daily Maretta Bees, MD   6 each at 08/14/23 1610   cloZAPine (CLOZARIL) tablet 200 mg  200 mg Oral QHS Maretta Bees, MD   200 mg at 08/13/23 2000   donepezil (ARICEPT) tablet 10 mg  10 mg Oral QHS Maretta Bees, MD   10 mg at 08/13/23 2000   enoxaparin (LOVENOX) injection 40 mg  40 mg Subcutaneous Q24H Lyda Perone  M, DO   40 mg at 08/14/23 1517   insulin aspart (novoLOG) injection 0-9 Units  0-9 Units Subcutaneous TID WC Hillary Bow, DO   1 Units at 08/14/23 1140   memantine (NAMENDA) tablet 5 mg  5 mg Oral BID Maretta Bees, MD   5 mg at 08/14/23 0852   ondansetron (ZOFRAN) tablet 4 mg  4 mg Oral Q6H PRN Hillary Bow, DO       Or   ondansetron [facility]) injection 4 mg  4 mg Intravenous Q6H PRN Hillary Bow, DO       tamsulosin [facility]) capsule 0.4 mg  0.4 mg Oral Daily Maretta Bees, MD   0.4 mg at 08/14/23 1140     Discharge Medications: Please see discharge summary for a list of discharge medications.  Relevant Imaging Results:  Relevant Lab Results:   Additional Information SSN: [id number] [place], [place]

## 2023-08-14 NOTE — Plan of Care (Signed)
  Problem: Coping: Goal: Ability to adjust to condition or change in health will improve Outcome: Progressing   Problem: Fluid Volume: Goal: Ability to maintain a balanced intake and output will improve Outcome: Progressing   Problem: Metabolic: Goal: Ability to maintain appropriate glucose levels will improve Outcome: Progressing   Problem: Tissue Perfusion: Goal: Adequacy of tissue perfusion will improve Outcome: Progressing   Problem: Clinical Measurements: Goal: Ability to maintain clinical measurements within normal limits will improve Outcome: Progressing Goal: Will remain free from infection Outcome: Progressing Goal: Diagnostic test results will improve Outcome: Progressing Goal: Respiratory complications will improve Outcome: Progressing Goal: Cardiovascular complication will be avoided Outcome: Progressing   Problem: Activity: Goal: Risk for activity intolerance will decrease Outcome: Progressing   Problem: Nutrition: Goal: Adequate nutrition will be maintained Outcome: Progressing   Problem: Elimination: Goal: Will not experience complications related to urinary retention Outcome: Progressing   Problem: Pain Managment: Goal: General experience of comfort will improve Outcome: Progressing   Problem: Safety: Goal: Ability to remain free from injury will improve Outcome: Progressing

## 2023-08-14 NOTE — Progress Notes (Addendum)
PROGRESS NOTE        PATIENT DETAILS Name: Elizabeth Schwartz Age: 77 y.o. Sex: female Date of Birth: [birth year] Admit Date: 08/09/2023 Admitting Physician Dorcas Carrow, MD WUJ:[id number], Charlcie Cradle, MD  Brief Summary: Patient is a 77 y.o.  female with history of HTN, DM-2, depression/schizoaffective disorder presented with a fall-neighbors found her pinned to the ground with the stove on top of her.  She was subsequently brought to the ED-she was found to have compressive injury to her left thigh along with AKI and rhabdomyolysis.  Significant events: 9/25>> admit to [facility]  Significant studies: 9/25>> CXR: Small pleural effusion. 9/25>> x-ray left femur: Negative for fracture 9/25>> x-ray pelvis:No fracture. 9/25>> CT head: No acute intracranial process. 9/25>> CT C-spine no acute 9/25>> CT chest/abdomen/pelvis: No acute posttraumatic sequelae. 9/27>> CT L-spine: No fracture 9/27 >>CT T-spine: No fracture 9/27>> MRI lumbar/thoracic spine: No acute abnormality.  Significant microbiology data: 9/25>> COVID/influenza/RSV PCR: Negative 9/25>> culture: No growth  Procedures: None  Consults: Orthopedics Neurology  Subjective: Pleasantly confused.  Objective: Vitals: Blood pressure (!) 147/68, pulse 96, temperature 98.4 F (36.9 C), temperature source Oral, resp. rate 19, height 5\' 5"  (1.651 m), weight 76.4 kg, SpO2 96%.   Exam: Gen Exam: Confused but not in any distress. HEENT:atraumatic, normocephalic Chest: B/L clear to auscultation anteriorly CVS:S1S2 regular Abdomen:soft non tender, non distended Extremities:no edema Neurology: Just about able to move LLE side-to-side-moves RLE pretty well today. Skin: no rash  Pertinent Labs/Radiology:    Latest Ref Rng & Units 08/12/2023   11:57 AM 08/11/2023    3:30 PM 08/10/2023    4:08 AM  CBC  WBC 4.0 - 10.5 K/uL 7.1  8.3  9.0   Hemoglobin 12.0 - 15.0 g/dL 8.7  9.2  78.2   Hematocrit 36.0 - 46.0 % 28.1   29.1  34.5   Platelets 150 - 400 K/uL 221  223  238     Lab Results  Component Value Date   NA 139 08/14/2023   K 3.5 08/14/2023   CL 107 08/14/2023   CO2 23 08/14/2023      Assessment/Plan: Trauma from stove falling on her left thigh Left thigh swelling secondary to compression injury Ortho following-no signs of compartment  syndrome. PT/OT  Bilateral lower extremity weakness (left> right) Difficult exam due to cognitive issues/pain-suspect this is more of an issue with cognitive issues/rhabdomyolysis/compressive neuropathy rather than anything significant.  Extensive neuroimaging negative-see above Neurology evaluation 9/28-no further workup recommended Continue to work with PT/OT.    Acute urinary retention Noted on 9/29 Foley catheter-start Flomax Voiding trial when patient is a bit more ambulatory.  AKI Essentially resolved Likely due to rhabdo  Rhabdomyolysis Secondary to compression from stove-on left thigh CK trend continues to slowly improve  Transaminitis Secondary to rhabdo Downtrending Trend LFTs  Elevated troponins Secondary to rhabdo Echo stable per cardiology  PNA Afebrile Completed Rocephin x 5 days and Zithromax x 3 days Currently asymptomatic.  HLD Holding Zetia/fenofibrate until rhabdomyolysis improves  DM-2 (A1c 6.1 on 8/29) CBG stable SSI  Recent Labs    08/13/23 1604 08/13/23 2033 08/14/23 0817  GLUCAP 135* 162* 168*    Dementia Mood disorder ?  Cognitive dysfunction at baseline Continue clozapine/Aricept/Namenda  Some mild delirium occasionally-maintain delirium precautions. PT/OT/TOC eval-SNF plan.   BMI: Estimated body mass index is 28.03 kg/m as calculated from  the following:   Height as of this encounter: 5\' 5"  (1.651 m).   Weight as of this encounter: 76.4 kg.   Code status:   Code Status: Full Code   DVT Prophylaxis: enoxaparin (LOVENOX) injection 40 mg Start: 08/10/23 1400    Family Communication:  Niece [name]-[phone]/27   Disposition Plan: Status is: Inpatient Remains inpatient appropriate because: Severity of illness   Planned Discharge Destination:Skilled nursing facility   Diet: Diet Order             Diet Heart Room service appropriate? Yes; Fluid consistency: Thin  Diet effective now                     Antimicrobial agents: Anti-infectives (From admission, onward)    Start     Dose/Rate Route Frequency Ordered Stop   08/10/23 0300  azithromycin (ZITHROMAX) 500 mg in sodium chloride 0.9 % 250 mL IVPB  Status:  Discontinued        500 mg 250 mL/hr over 60 Minutes Intravenous Every 24 hours 08/10/23 0208 08/12/23 1047   08/09/23 2000  cefTRIAXone (ROCEPHIN) 2 g in sodium chloride 0.9 % 100 mL IVPB        2 g 200 mL/hr over 30 Minutes Intravenous Every 24 hours 08/09/23 1949 08/13/23 2019        MEDICATIONS: Scheduled Meds:  Chlorhexidine Gluconate Cloth  6 each Topical Daily   clozapine  200 mg Oral QHS   donepezil  10 mg Oral QHS   enoxaparin (LOVENOX) injection  40 mg Subcutaneous Q24H   insulin aspart  0-9 Units Subcutaneous TID WC   memantine  5 mg Oral BID   Continuous Infusions:  sodium chloride Stopped (08/14/23 0958)   PRN Meds:.acetaminophen **OR** acetaminophen, ondansetron **OR** ondansetron (ZOFRAN) IV   I have personally reviewed following labs and imaging studies  LABORATORY DATA: CBC: Recent Labs  Lab 08/09/23 2042 08/10/23 0408 08/11/23 1530 08/12/23 1157  WBC 14.4* 9.0 8.3 7.1  NEUTROABS 12.1*  --  5.7  --   HGB 13.0 11.0* 9.2* 8.7*  HCT 41.8 34.5* 29.1* 28.1*  MCV 95.0 95.3 96.0 94.3  PLT 283 238 223 221    Basic Metabolic Panel: Recent Labs  Lab 08/10/23 0408 08/11/23 1530 08/12/23 0627 08/13/23 1208 08/14/23 0437  NA 141 141 139 138 139  K 3.8 3.5 3.6 4.0 3.5  CL 107 107 108 106 107  CO2 24 24 23  21* 23  GLUCOSE 175* 126* 150* 144* 155*  BUN 37* 35* 30* 21 16  CREATININE 1.28* 1.05* 1.01* 0.90  1.11*  CALCIUM 7.9* 7.4* 7.3* 7.5* 7.7*  MG  --  1.7  --   --   --     GFR: Estimated Creatinine Clearance: 43.4 mL/min (A) (by C-G formula based on SCr of 1.11 mg/dL (H)).  Liver Function Tests: Recent Labs  Lab 08/10/23 0408 08/11/23 1530 08/12/23 0627 08/13/23 1208 08/14/23 0437  AST 316* 195* 160* 105* 87*  ALT 94* 77* 66* 63* 59*  ALKPHOS 44 49 60 71 79  BILITOT 0.8 0.3 0.4 0.5 0.4  PROT 5.9* 5.3* 4.8* 4.8* 5.1*  ALBUMIN 2.9* 2.3* 2.0* 1.9* 2.0*   No results for input(s): "LIPASE", "AMYLASE" in the last 168 hours. No results for input(s): "AMMONIA" in the last 168 hours.  Coagulation Profile: Recent Labs  Lab 08/09/23 2042  INR 1.1    Cardiac Enzymes: Recent Labs  Lab 08/10/23 0408 08/10/23 1804 08/11/23 1530 08/13/23 1208 08/14/23  0437  CKTOTAL 26,240* 19,727* 13,813* 5,560* 3,979*    BNP (last 3 results) No results for input(s): "PROBNP" in the last 8760 hours.  Lipid Profile: No results for input(s): "CHOL", "HDL", "LDLCALC", "TRIG", "CHOLHDL", "LDLDIRECT" in the last 72 hours.  Thyroid Function Tests: Recent Labs    08/12/23 1157  TSH 2.990    Anemia Panel: Recent Labs    08/12/23 1157  VITAMINB12 358  FOLATE 10.0    Urine analysis:    Component Value Date/Time   COLORURINE YELLOW 08/09/2023 2042   APPEARANCEUR CLEAR 08/09/2023 2042   LABSPEC 1.018 08/09/2023 2042   PHURINE 5.0 08/09/2023 2042   GLUCOSEU NEGATIVE 08/09/2023 2042   HGBUR LARGE (A) 08/09/2023 2042   BILIRUBINUR NEGATIVE 08/09/2023 2042   KETONESUR 5 (A) 08/09/2023 2042   PROTEINUR 30 (A) 08/09/2023 2042   UROBILINOGEN 1.0 09/03/2009 1956   NITRITE NEGATIVE 08/09/2023 2042   LEUKOCYTESUR NEGATIVE 08/09/2023 2042    Sepsis Labs: Lactic Acid, Venous    Component Value Date/Time   LATICACIDVEN 2.0 (HH) 08/10/2023 0416    MICROBIOLOGY: Recent Results (from the past 240 hour(s))  Resp panel by RT-PCR (RSV, Flu A&B, Covid) Urine, Catheterized     Status: None    Collection Time: 08/09/23  8:42 PM   Specimen: Urine, Catheterized; Nasal Swab  Result Value Ref Range Status   SARS Coronavirus 2 by RT PCR NEGATIVE NEGATIVE Final   Influenza A by PCR NEGATIVE NEGATIVE Final   Influenza B by PCR NEGATIVE NEGATIVE Final    Comment: (NOTE) The Xpert Xpress SARS-CoV-2/FLU/RSV plus assay is intended as an aid in the diagnosis of influenza from Nasopharyngeal swab specimens and should not be used as a sole basis for treatment. Nasal washings and aspirates are unacceptable for Xpert Xpress SARS-CoV-2/FLU/RSV testing.  Fact Sheet for Patients: BloggerCourse.com  Fact Sheet for Healthcare Providers: SeriousBroker.it  This test is not yet approved or cleared by the [place] FDA and has been authorized for detection and/or diagnosis of SARS-CoV-2 by FDA under an Emergency Use Authorization (EUA). This EUA will remain in effect (meaning this test can be used) for the duration of the COVID-19 declaration under Section 564(b)(1) of the Act, 21 U.S.C. section 360bbb-3(b)(1), unless the authorization is terminated or revoked.     Resp Syncytial Virus by PCR NEGATIVE NEGATIVE Final    Comment: (NOTE) Fact Sheet for Patients: BloggerCourse.com  Fact Sheet for Healthcare Providers: SeriousBroker.it  This test is not yet approved or cleared by the [place] FDA and has been authorized for detection and/or diagnosis of SARS-CoV-2 by FDA under an Emergency Use Authorization (EUA). This EUA will remain in effect (meaning this test can be used) for the duration of the COVID-19 declaration under Section 564(b)(1) of the Act, 21 U.S.C. section 360bbb-3(b)(1), unless the authorization is terminated or revoked.  Performed at [facility], [address] [place], [place] [address]   Blood Culture (routine x 2)     Status: None   Collection Time:  08/09/23  8:42 PM   Specimen: BLOOD RIGHT HAND  Result Value Ref Range Status   Specimen Description BLOOD RIGHT HAND  Final   Special Requests   Final    BOTTLES DRAWN AEROBIC AND ANAEROBIC Blood Culture adequate volume   Culture   Final    NO GROWTH 5 DAYS Performed at [facility], [address], [place], [place] [address]    Report Status 08/14/2023 FINAL  Final  Blood Culture (  routine x 2)     Status: None   Collection Time: 08/09/23  8:42 PM   Specimen: BLOOD LEFT HAND  Result Value Ref Range Status   Specimen Description BLOOD LEFT HAND  Final   Special Requests   Final    BOTTLES DRAWN AEROBIC AND ANAEROBIC Blood Culture adequate volume   Culture   Final    NO GROWTH 5 DAYS Performed at [facility], [address], [place], [place] [address]    Report Status 08/14/2023 FINAL  Final    RADIOLOGY STUDIES/RESULTS: No results found.   LOS: 4 days   Jeoffrey Massed, MD  [facility]    To contact the attending provider between 7A-7P or the covering provider during after hours 7P-7A, please log into the web site [email or web address] and access using universal [place] password for that web site. If you do not have the password, please call the hospital operator.  08/14/2023, 9:59 AM

## 2023-08-14 NOTE — TOC Progression Note (Addendum)
Transition of Care [facility]) - Progression Note    Patient Details  Name: Elizabeth Schwartz MRN: [id number] Date of Birth: [birth year]  Transition [facility]) CM/SW Contact  Mearl Latin, LCSW Phone Number: 08/14/2023, 9:44 AM  Clinical Narrative:    9:44 AM-CSW left voicemail for patient's niece (listed as HCPOA).  2:14 PM-CSW sent HIPAA compliant text to niece requesting a call back.    Expected Discharge Plan: Skilled Nursing Facility Barriers to Discharge: Continued Medical Work up, English as a second language teacher  Expected Discharge Plan and Services       Living arrangements for the past 2 months: Apartment                                       Social Determinants of Health (SDOH) Interventions SDOH Screenings   Food Insecurity: No Food Insecurity (03/22/2023)  Housing: Patient Unable To Answer (03/22/2023)  Transportation Needs: No Transportation Needs (03/22/2023)  Utilities: Not At Risk (03/22/2023)  Alcohol Screen: Low Risk  (03/22/2023)  Depression (PHQ2-9): Low Risk  (07/13/2023)  Financial Resource Strain: Low Risk  (03/22/2023)  Physical Activity: Insufficiently Active (03/22/2023)  Social Connections: Socially Isolated (03/22/2023)  Stress: No Stress Concern Present (03/22/2023)  Tobacco Use: Medium Risk (08/09/2023)    Readmission Risk Interventions     No data to display

## 2023-08-14 NOTE — Progress Notes (Signed)
Brief cardiology follow up note:  Reviewed notes from Dr. Royann Shivers. Echo stable, based on notes from 9/27 no plans for further cardiology workup at this time. Elevated troponin in the setting of rhabdomyolysis. Cardiology will sign off. Does not need outpatient cardiology follow up at this time. Please contact us with any questions or concerns.  Jodelle Red, MD, PhD, [facility]  [facility]  [place]  Heart & Vascular at [facility] at [facility] [address], [address] [place], [place] [address] [phone]

## 2023-08-14 NOTE — Progress Notes (Signed)
Physical Therapy Treatment Patient Details Name: Elizabeth Schwartz MRN: [id number] DOB: 09-15-1946 Today's Date: 08/14/2023   History of Present Illness Pt is a 77 y/o F admitted on 08/09/23. EMS was called for welfare check & pt was found on the ground pinned underneath the stove door, pt was confused. Pt is being treated for acute rhabdomyolysis & L thigh injury. PMH: dementia, HTN, schizoaffective disorder, DM2    PT Comments  Patient seen for exercises for bil LEs and LUE. Patient continues with no AROM Lt ankle or knee and 1+ hip flexion on L. Patient remains painful in thigh with AAROM/PROM LLE.     If plan is discharge home, recommend the following: Two people to help with walking and/or transfers;Two people to help with bathing/dressing/bathroom;Direct supervision/assist for medications management;Help with stairs or ramp for entrance;Assist for transportation;Assistance with feeding;Assistance with cooking/housework;Direct supervision/assist for financial management   Can travel by private vehicle     No  Equipment Recommendations  Wheelchair (measurements PT);Wheelchair cushion (measurements PT);Hoyer lift    Recommendations for Other Services       Precautions / Restrictions Precautions Precautions: Fall Restrictions Weight Bearing Restrictions: No     Mobility  Bed Mobility               General bed mobility comments: +2 to scoot up to [facility]    Transfers                        Ambulation/Gait                   Stairs             Wheelchair Mobility     Tilt Bed    Modified Rankin (Stroke Patients Only)       Balance                                            Cognition Arousal: Alert Behavior During Therapy: WFL for tasks assessed/performed Overall Cognitive Status: No family/caregiver present to determine baseline cognitive functioning                                 General Comments: pt  with trouble completing her thoughts--?expressive vs memory        Exercises General Exercises - Upper Extremity Shoulder Flexion: AAROM, Left, 5 reps Shoulder Horizontal ADduction: AAROM, Left, 5 reps Elbow Flexion: AAROM, Left, 5 reps Elbow Extension: AAROM, Left, 5 reps Digit Composite Flexion: AROM, Left, 5 reps Composite Extension: AROM, Left, 5 reps General Exercises - Lower Extremity Ankle Circles/Pumps: AROM, Right, PROM, Left, 5 reps Quad Sets: Other (comment) (attempted with LLE without any contraction noted) Heel Slides: AROM, Right, AAROM, Left, 5 reps Straight Leg Raises: AROM, Right, 10 reps, AAROM, Left, 5 reps    General Comments        Pertinent Vitals/Pain Pain Assessment Pain Assessment: Faces Faces Pain Scale: Hurts whole lot Pain Location: LUE and LE with AAROM Pain Descriptors / Indicators: Aching, Sore Pain Intervention(s): Limited activity within patient's tolerance, Repositioned    Home Living                          Prior Function  PT Goals (current goals can now be found in the care plan section) Acute Rehab PT Goals Patient Stated Goal: get stronger Time For Goal Achievement: 08/25/23 Potential to Achieve Goals: Fair Progress towards PT goals: Progressing toward goals    Frequency    Min 1X/week      PT Plan      Co-evaluation              AM-PAC PT "6 Clicks" Mobility   Outcome Measure  Help needed turning from your back to your side while in a flat bed without using bedrails?: A Lot Help needed moving from lying on your back to sitting on the side of a flat bed without using bedrails?: Total Help needed moving to and from a bed to a chair (including a wheelchair)?: Total Help needed standing up from a chair using your arms (e.g., wheelchair or bedside chair)?: Total Help needed to walk in hospital room?: Total Help needed climbing 3-5 steps with a railing? : Total 6 Click Score: 7    End of  Session   Activity Tolerance: Patient limited by pain Patient left: in bed;with call bell/phone within reach;with bed alarm set   PT Visit Diagnosis: Difficulty in walking, not elsewhere classified (R26.2);Muscle weakness (generalized) (M62.81);Unsteadiness on feet (R26.81);Other abnormalities of gait and mobility (R26.89)     Time: 7829-5621 PT Time Calculation (min) (ACUTE ONLY): 19 min  Charges:    $Therapeutic Exercise: 8-22 mins PT General Charges $$ ACUTE PT VISIT: 1 Visit                      Jerolyn Center, PT Acute Rehabilitation Services  Office [phone]    Zena Amos 08/14/2023, 2:29 PM

## 2023-08-14 NOTE — Care Management Important Message (Signed)
Important Message  Patient Details  Name: Elizabeth Schwartz MRN: [id number] Date of Birth: 1946/01/19   Important Message Given:  Yes - Medicare IM     Dorena Bodo 08/14/2023, 3:38 PM

## 2023-08-15 DIAGNOSIS — M6282 Rhabdomyolysis: Secondary | ICD-10-CM | POA: Diagnosis not present

## 2023-08-15 DIAGNOSIS — R7989 Other specified abnormal findings of blood chemistry: Secondary | ICD-10-CM | POA: Diagnosis not present

## 2023-08-15 DIAGNOSIS — G309 Alzheimer's disease, unspecified: Secondary | ICD-10-CM | POA: Diagnosis not present

## 2023-08-15 DIAGNOSIS — N179 Acute kidney failure, unspecified: Secondary | ICD-10-CM | POA: Diagnosis not present

## 2023-08-15 LAB — COMPREHENSIVE METABOLIC PANEL
ALT: 54 U/L — ABNORMAL HIGH (ref 0–44)
AST: 64 U/L — ABNORMAL HIGH (ref 15–41)
Albumin: 2.1 g/dL — ABNORMAL LOW (ref 3.5–5.0)
Alkaline Phosphatase: 69 U/L (ref 38–126)
Anion gap: 8 (ref 5–15)
BUN: 11 mg/dL (ref 8–23)
CO2: 22 mmol/L (ref 22–32)
Calcium: 7.7 mg/dL — ABNORMAL LOW (ref 8.9–10.3)
Chloride: 108 mmol/L (ref 98–111)
Creatinine, Ser: 0.79 mg/dL (ref 0.44–1.00)
GFR, Estimated: >60 mL/min (ref >60)
Glucose, Bld: 159 mg/dL — ABNORMAL HIGH (ref 70–99)
Potassium: 3.7 mmol/L (ref 3.5–5.1)
Sodium: 138 mmol/L (ref 135–145)
Total Bilirubin: 0.3 mg/dL (ref 0.3–1.2)
Total Protein: 5 g/dL — ABNORMAL LOW (ref 6.5–8.1)

## 2023-08-15 LAB — CBC
HCT: 32.3 % — ABNORMAL LOW (ref 36.0–46.0)
Hemoglobin: 10.2 g/dL — ABNORMAL LOW (ref 12.0–15.0)
MCH: 30 pg (ref 26.0–34.0)
MCHC: 31.6 g/dL (ref 30.0–36.0)
MCV: 95 fL (ref 80.0–100.0)
Platelets: 187 10*3/uL (ref 150–400)
RBC: 3.4 MIL/uL — ABNORMAL LOW (ref 3.87–5.11)
RDW: 14.3 % (ref 11.5–15.5)
WBC: 6.2 10*3/uL (ref 4.0–10.5)
nRBC: 0 % (ref 0.0–0.2)

## 2023-08-15 LAB — GLUCOSE, CAPILLARY
Glucose-Capillary: 133 mg/dL — ABNORMAL HIGH (ref 70–99)
Glucose-Capillary: 129 mg/dL — ABNORMAL HIGH (ref 70–99)
Glucose-Capillary: 153 mg/dL — ABNORMAL HIGH (ref 70–99)
Glucose-Capillary: 128 mg/dL — ABNORMAL HIGH (ref 70–99)

## 2023-08-15 LAB — CK: Total CK: 2528 U/L — ABNORMAL HIGH (ref 38–234)

## 2023-08-15 NOTE — Plan of Care (Signed)
Pt has rested quietly throughout the night with no distress noted. Alert and oriented. On room air. SR/ST. Foley to BSD. Dressings intact to leg.  No complaints voiced.     Problem: Education: Goal: Ability to describe self-care measures that may prevent or decrease complications (Diabetes Survival Skills Education) will improve Outcome: Progressing Goal: Individualized Educational Video(s) Outcome: Progressing   Problem: Coping: Goal: Ability to adjust to condition or change in health will improve Outcome: Progressing   Problem: Fluid Volume: Goal: Ability to maintain a balanced intake and output will improve Outcome: Progressing   Problem: Health Behavior/Discharge Planning: Goal: Ability to identify and utilize available resources and services will improve Outcome: Progressing Goal: Ability to manage health-related needs will improve Outcome: Progressing   Problem: Metabolic: Goal: Ability to maintain appropriate glucose levels will improve Outcome: Progressing   Problem: Nutritional: Goal: Maintenance of adequate nutrition will improve Outcome: Progressing Goal: Progress toward achieving an optimal weight will improve Outcome: Progressing   Problem: Skin Integrity: Goal: Risk for impaired skin integrity will decrease Outcome: Progressing   Problem: Tissue Perfusion: Goal: Adequacy of tissue perfusion will improve Outcome: Progressing   Problem: Education: Goal: Knowledge of General Education information will improve Description: Including pain rating scale, medication(s)/side effects and non-pharmacologic comfort measures Outcome: Progressing   Problem: Health Behavior/Discharge Planning: Goal: Ability to manage health-related needs will improve Outcome: Progressing   Problem: Clinical Measurements: Goal: Ability to maintain clinical measurements within normal limits will improve Outcome: Progressing Goal: Will remain free from infection Outcome:  Progressing Goal: Diagnostic test results will improve Outcome: Progressing Goal: Respiratory complications will improve Outcome: Progressing Goal: Cardiovascular complication will be avoided Outcome: Progressing   Problem: Activity: Goal: Risk for activity intolerance will decrease Outcome: Progressing   Problem: Nutrition: Goal: Adequate nutrition will be maintained Outcome: Progressing   Problem: Coping: Goal: Level of anxiety will decrease Outcome: Progressing   Problem: Elimination: Goal: Will not experience complications related to bowel motility Outcome: Progressing Goal: Will not experience complications related to urinary retention Outcome: Progressing   Problem: Pain Managment: Goal: General experience of comfort will improve Outcome: Progressing   Problem: Safety: Goal: Ability to remain free from injury will improve Outcome: Progressing   Problem: Skin Integrity: Goal: Risk for impaired skin integrity will decrease Outcome: Progressing

## 2023-08-15 NOTE — Progress Notes (Signed)
PROGRESS NOTE        PATIENT DETAILS Name: Elizabeth Schwartz Age: 77 y.o. Sex: female Date of Birth: 08/24/1946 Admit Date: 08/09/2023 Admitting Physician Dorcas Carrow, MD RUE:[id number], Charlcie Cradle, MD  Brief Summary: Patient is a 77 y.o.  female with history of HTN, DM-2, depression/schizoaffective disorder presented with a fall-neighbors found her pinned to the ground with the stove on top of her.  She was subsequently brought to the ED-she was found to have compressive injury to her left thigh along with AKI and rhabdomyolysis.  Significant events: 9/25>> admit to [facility]  Significant studies: 9/25>> CXR: Small pleural effusion. 9/25>> x-ray left femur: Negative for fracture 9/25>> x-ray pelvis:No fracture. 9/25>> CT head: No acute intracranial process. 9/25>> CT C-spine no acute 9/25>> CT chest/abdomen/pelvis: No acute posttraumatic sequelae. 9/27>> CT L-spine: No fracture 9/27 >>CT T-spine: No fracture 9/27>> MRI lumbar/thoracic spine: No acute abnormality.  Significant microbiology data: 9/25>> COVID/influenza/RSV PCR: Negative 9/25>> culture: No growth  Procedures: None  Consults: Orthopedics Neurology  Subjective: No major issues overnight-confused-saw her move her left lower extremity side-to-side when I walked in this morning.  Pleasantly confused.  Objective: Vitals: Blood pressure 136/70, pulse (!) 104, temperature 97.8 F (36.6 C), temperature source Oral, resp. rate 18, height 5\' 5"  (1.651 m), weight 76.4 kg, SpO2 95%.   Exam: Gen Exam: Pleasantly confused-not in any distress. HEENT:atraumatic, normocephalic Chest: B/L clear to auscultation anteriorly CVS:S1S2 regular Abdomen:soft non tender, non distended Extremities:no edema Neurology: Just about able to move left lower extremity side-to-side-has cognitive issues but suspect pain limits pain-as when passively moved-she cries out in pain.  Moving RLE spontaneously. Skin: no  rash  Pertinent Labs/Radiology:    Latest Ref Rng & Units 08/15/2023    4:13 AM 08/12/2023   11:57 AM 08/11/2023    3:30 PM  CBC  WBC 4.0 - 10.5 K/uL 6.2  7.1  8.3   Hemoglobin 12.0 - 15.0 g/dL 11.9  8.7  9.2   Hematocrit 36.0 - 46.0 % 32.3  28.1  29.1   Platelets 150 - 400 K/uL 187  221  223     Lab Results  Component Value Date   NA 138 08/15/2023   K 3.7 08/15/2023   CL 108 08/15/2023   CO2 22 08/15/2023      Assessment/Plan: Trauma from stove falling on her left thigh Left thigh swelling secondary to compression injury Ortho following-no signs of compartment  syndrome. PT/OT  Bilateral lower extremity weakness (left> right) Difficult exam due to cognitive issues/pain-suspect this is more of an issue with cognitive issues/rhabdomyolysis/compressive neuropathy rather than anything significant.  Extensive neuroimaging negative-see above Neurology evaluation 9/28-no further workup recommended Continue to work with PT/OT.    Acute urinary retention Noted on 9/29-subsequently started on Foley/Flomax Voiding trial when patient is a bit more ambulatory-and swelling in the left thigh has improved.  AKI Resolved Likely due to rhabdo  Rhabdomyolysis Secondary to compression from stove-on left thigh CK trend continues to slowly improve  Transaminitis Secondary to rhabdo Downtrending Trend LFTs  Elevated troponins Secondary to rhabdo Echo stable per cardiology  PNA Afebrile Completed Rocephin x 5 days and Zithromax x 3 days Currently asymptomatic.  HLD Holding Zetia/fenofibrate until rhabdomyolysis improves  DM-2 (A1c 6.1 on 8/29) CBG stable SSI  Recent Labs    08/14/23 1712 08/14/23 2016 08/15/23 0834  GLUCAP 172*  132* 133*    Dementia Mood disorder ?  Cognitive dysfunction at baseline Continue clozapine/Aricept/Namenda  Some mild delirium occasionally-maintain delirium precautions. PT/OT/TOC eval-SNF plan.   BMI: Estimated body mass index is  28.03 kg/m as calculated from the following:   Height as of this encounter: 5\' 5"  (1.651 m).   Weight as of this encounter: 76.4 kg.   Code status:   Code Status: Full Code   DVT Prophylaxis: enoxaparin (LOVENOX) injection 40 mg Start: 08/10/23 1400    Family Communication: Niece [name]-[phone]/27   Disposition Plan: Status is: Inpatient Remains inpatient appropriate because: Severity of illness   Planned Discharge Destination:Skilled nursing facility   Diet: Diet Order             Diet Heart Room service appropriate? Yes; Fluid consistency: Thin  Diet effective now                     Antimicrobial agents: Anti-infectives (From admission, onward)    Start     Dose/Rate Route Frequency Ordered Stop   08/10/23 0300  azithromycin (ZITHROMAX) 500 mg in sodium chloride 0.9 % 250 mL IVPB  Status:  Discontinued        500 mg 250 mL/hr over 60 Minutes Intravenous Every 24 hours 08/10/23 0208 08/12/23 1047   08/09/23 2000  cefTRIAXone (ROCEPHIN) 2 g in sodium chloride 0.9 % 100 mL IVPB        2 g 200 mL/hr over 30 Minutes Intravenous Every 24 hours 08/09/23 1949 08/13/23 2019        MEDICATIONS: Scheduled Meds:  Chlorhexidine Gluconate Cloth  6 each Topical Daily   clozapine  200 mg Oral QHS   donepezil  10 mg Oral QHS   enoxaparin (LOVENOX) injection  40 mg Subcutaneous Q24H   insulin aspart  0-9 Units Subcutaneous TID WC   memantine  5 mg Oral BID   tamsulosin  0.4 mg Oral Daily   Continuous Infusions:  sodium chloride 50 mL/hr at 08/14/23 1051   PRN Meds:.acetaminophen **OR** acetaminophen, ondansetron **OR** ondansetron (ZOFRAN) IV   I have personally reviewed following labs and imaging studies  LABORATORY DATA: CBC: Recent Labs  Lab 08/09/23 2042 08/10/23 0408 08/11/23 1530 08/12/23 1157 08/15/23 0413  WBC 14.4* 9.0 8.3 7.1 6.2  NEUTROABS 12.1*  --  5.7  --   --   HGB 13.0 11.0* 9.2* 8.7* 10.2*  HCT 41.8 34.5* 29.1* 28.1* 32.3*  MCV  95.0 95.3 96.0 94.3 95.0  PLT 283 238 223 221 187    Basic Metabolic Panel: Recent Labs  Lab 08/11/23 1530 08/12/23 0627 08/13/23 1208 08/14/23 0437 08/15/23 0413  NA 141 139 138 139 138  K 3.5 3.6 4.0 3.5 3.7  CL 107 108 106 107 108  CO2 24 23 21* 23 22  GLUCOSE 126* 150* 144* 155* 159*  BUN 35* 30* 21 16 11   CREATININE 1.05* 1.01* 0.90 1.11* 0.79  CALCIUM 7.4* 7.3* 7.5* 7.7* 7.7*  MG 1.7  --   --   --   --     GFR: Estimated Creatinine Clearance: 60.2 mL/min (by C-G formula based on SCr of 0.79 mg/dL).  Liver Function Tests: Recent Labs  Lab 08/11/23 1530 08/12/23 0627 08/13/23 1208 08/14/23 0437 08/15/23 0413  AST 195* 160* 105* 87* 64*  ALT 77* 66* 63* 59* 54*  ALKPHOS 49 60 71 79 69  BILITOT 0.3 0.4 0.5 0.4 0.3  PROT 5.3* 4.8* 4.8* 5.1* 5.0*  ALBUMIN 2.3*  2.0* 1.9* 2.0* 2.1*   No results for input(s): "LIPASE", "AMYLASE" in the last 168 hours. No results for input(s): "AMMONIA" in the last 168 hours.  Coagulation Profile: Recent Labs  Lab 08/09/23 2042  INR 1.1    Cardiac Enzymes: Recent Labs  Lab 08/10/23 1804 08/11/23 1530 08/13/23 1208 08/14/23 0437 08/15/23 0413  CKTOTAL 19,727* 13,813* 5,560* 3,979* 2,528*    BNP (last 3 results) No results for input(s): "PROBNP" in the last 8760 hours.  Lipid Profile: No results for input(s): "CHOL", "HDL", "LDLCALC", "TRIG", "CHOLHDL", "LDLDIRECT" in the last 72 hours.  Thyroid Function Tests: Recent Labs    08/12/23 1157  TSH 2.990    Anemia Panel: Recent Labs    08/12/23 1157  VITAMINB12 358  FOLATE 10.0    Urine analysis:    Component Value Date/Time   COLORURINE YELLOW 08/09/2023 2042   APPEARANCEUR CLEAR 08/09/2023 2042   LABSPEC 1.018 08/09/2023 2042   PHURINE 5.0 08/09/2023 2042   GLUCOSEU NEGATIVE 08/09/2023 2042   HGBUR LARGE (A) 08/09/2023 2042   BILIRUBINUR NEGATIVE 08/09/2023 2042   KETONESUR 5 (A) 08/09/2023 2042   PROTEINUR 30 (A) 08/09/2023 2042   UROBILINOGEN  1.0 09/03/2009 1956   NITRITE NEGATIVE 08/09/2023 2042   LEUKOCYTESUR NEGATIVE 08/09/2023 2042    Sepsis Labs: Lactic Acid, Venous    Component Value Date/Time   LATICACIDVEN 2.0 (HH) 08/10/2023 0416    MICROBIOLOGY: Recent Results (from the past 240 hour(s))  Resp panel by RT-PCR (RSV, Flu A&B, Covid) Urine, Catheterized     Status: None   Collection Time: 08/09/23  8:42 PM   Specimen: Urine, Catheterized; Nasal Swab  Result Value Ref Range Status   SARS Coronavirus 2 by RT PCR NEGATIVE NEGATIVE Final   Influenza A by PCR NEGATIVE NEGATIVE Final   Influenza B by PCR NEGATIVE NEGATIVE Final    Comment: (NOTE) The Xpert Xpress SARS-CoV-2/FLU/RSV plus assay is intended as an aid in the diagnosis of influenza from Nasopharyngeal swab specimens and should not be used as a sole basis for treatment. Nasal washings and aspirates are unacceptable for Xpert Xpress SARS-CoV-2/FLU/RSV testing.  Fact Sheet for Patients: BloggerCourse.com  Fact Sheet for Healthcare Providers: SeriousBroker.it  This test is not yet approved or cleared by the [place] FDA and has been authorized for detection and/or diagnosis of SARS-CoV-2 by FDA under an Emergency Use Authorization (EUA). This EUA will remain in effect (meaning this test can be used) for the duration of the COVID-19 declaration under Section 564(b)(1) of the Act, 21 U.S.C. section 360bbb-3(b)(1), unless the authorization is terminated or revoked.     Resp Syncytial Virus by PCR NEGATIVE NEGATIVE Final    Comment: (NOTE) Fact Sheet for Patients: BloggerCourse.com  Fact Sheet for Healthcare Providers: SeriousBroker.it  This test is not yet approved or cleared by the [place] FDA and has been authorized for detection and/or diagnosis of SARS-CoV-2 by FDA under an Emergency Use Authorization (EUA). This EUA will remain in  effect (meaning this test can be used) for the duration of the COVID-19 declaration under Section 564(b)(1) of the Act, 21 U.S.C. section 360bbb-3(b)(1), unless the authorization is terminated or revoked.  Performed at [facility], [address] [place], [place] [address]   Blood Culture (routine x 2)     Status: None   Collection Time: 08/09/23  8:42 PM   Specimen: BLOOD RIGHT HAND  Result Value Ref Range Status   Specimen Description BLOOD RIGHT HAND  Final  Special Requests   Final    BOTTLES DRAWN AEROBIC AND ANAEROBIC Blood Culture adequate volume   Culture   Final    NO GROWTH 5 DAYS Performed at [facility], [address] [place], [place] [address]    Report Status 08/14/2023 FINAL  Final  Blood Culture (routine x 2)     Status: None   Collection Time: 08/09/23  8:42 PM   Specimen: BLOOD LEFT HAND  Result Value Ref Range Status   Specimen Description BLOOD LEFT HAND  Final   Special Requests   Final    BOTTLES DRAWN AEROBIC AND ANAEROBIC Blood Culture adequate volume   Culture   Final    NO GROWTH 5 DAYS Performed at [facility], [address], [place], [place] [address]    Report Status 08/14/2023 FINAL  Final    RADIOLOGY STUDIES/RESULTS: No results found.   LOS: 5 days   Jeoffrey Massed, MD  [facility]    To contact the attending provider between 7A-7P or the covering provider during after hours 7P-7A, please log into the web site [email or web address] and access using universal  password for that web site. If you do not have the password, please call the hospital operator.  08/15/2023, 10:30 AM

## 2023-08-15 NOTE — TOC Progression Note (Signed)
Transition of Care [facility]) - Progression Note    Patient Details  Name: Elizabeth Schwartz MRN: [id number] Date of Birth: December 19, 1945  Transition of Care [facility]) CM/SW Contact  Mearl Latin, LCSW Phone Number: 08/15/2023, 12:02 PM  Clinical Narrative:    CSW sent patient's niece the list of SNF bed offers for review when she gets off of work.    Expected Discharge Plan: Skilled Nursing Facility Barriers to Discharge: Continued Medical Work up, English as a second language teacher  Expected Discharge Plan and Services In-house Referral: Clinical Social Work   Post Acute Care Choice: Skilled Nursing Facility Living arrangements for the past 2 months: Apartment                                       Social Determinants of Health (SDOH) Interventions SDOH Screenings   Food Insecurity: No Food Insecurity (03/22/2023)  Housing: Patient Unable To Answer (03/22/2023)  Transportation Needs: No Transportation Needs (03/22/2023)  Utilities: Not At Risk (03/22/2023)  Alcohol Screen: Low Risk  (03/22/2023)  Depression (PHQ2-9): Low Risk  (07/13/2023)  Financial Resource Strain: Low Risk  (03/22/2023)  Physical Activity: Insufficiently Active (03/22/2023)  Social Connections: Socially Isolated (03/22/2023)  Stress: No Stress Concern Present (03/22/2023)  Tobacco Use: Medium Risk (08/09/2023)    Readmission Risk Interventions     No data to display

## 2023-08-16 ENCOUNTER — Other Ambulatory Visit: Payer: Self-pay

## 2023-08-16 DIAGNOSIS — G309 Alzheimer's disease, unspecified: Secondary | ICD-10-CM | POA: Diagnosis not present

## 2023-08-16 DIAGNOSIS — R7989 Other specified abnormal findings of blood chemistry: Secondary | ICD-10-CM | POA: Diagnosis not present

## 2023-08-16 DIAGNOSIS — M6282 Rhabdomyolysis: Secondary | ICD-10-CM | POA: Diagnosis not present

## 2023-08-16 DIAGNOSIS — N179 Acute kidney failure, unspecified: Secondary | ICD-10-CM | POA: Diagnosis not present

## 2023-08-16 LAB — BASIC METABOLIC PANEL
Anion gap: 12 (ref 5–15)
BUN: 11 mg/dL (ref 8–23)
CO2: 20 mmol/L — ABNORMAL LOW (ref 22–32)
Calcium: 8 mg/dL — ABNORMAL LOW (ref 8.9–10.3)
Chloride: 107 mmol/L (ref 98–111)
Creatinine, Ser: 0.75 mg/dL (ref 0.44–1.00)
GFR, Estimated: >60 mL/min (ref >60)
Glucose, Bld: 139 mg/dL — ABNORMAL HIGH (ref 70–99)
Potassium: 3.7 mmol/L (ref 3.5–5.1)
Sodium: 139 mmol/L (ref 135–145)

## 2023-08-16 LAB — GLUCOSE, CAPILLARY
Glucose-Capillary: 131 mg/dL — ABNORMAL HIGH (ref 70–99)
Glucose-Capillary: 151 mg/dL — ABNORMAL HIGH (ref 70–99)
Glucose-Capillary: 149 mg/dL — ABNORMAL HIGH (ref 70–99)

## 2023-08-16 LAB — CK: Total CK: 1494 U/L — ABNORMAL HIGH (ref 38–234)

## 2023-08-16 MED ORDER — ORAL CARE MOUTH RINSE: 15.0000 mL | OROMUCOSAL | Status: DC | PRN

## 2023-08-16 NOTE — Progress Notes (Signed)
Mobility Specialist Progress Note;   08/16/23 1530  Mobility  Activity Transferred from bed to chair  Level of Assistance Moderate assist, patient does 50-74% (+2)  Assistive Device Front wheel walker  Activity Response Tolerated well  Mobility Referral Yes  $Mobility charge 1 Mobility  Mobility Specialist Start Time (ACUTE ONLY) 1530  Mobility Specialist Stop Time (ACUTE ONLY) 1540  Mobility Specialist Time Calculation (min) (ACUTE ONLY) 10 min   NT requested help with transferring pt B>C. Required modA +2 for transfer. Pt weak in LUE and LLE. Pt left in chair with all needs met. Alarm on.  Caesar Bookman Mobility Specialist Please contact via SecureChat or Rehab Office [phone]

## 2023-08-16 NOTE — [place] FL2 (Signed)
[place] MEDICAID FL2 LEVEL OF CARE FORM     IDENTIFICATION  Patient Name: Elizabeth Schwartz Birthdate: 1946/06/05 Sex: female Admission Date (Current Location): 08/09/2023  [facility] and [place] Number:  Producer, television/film/video and Address:  [facility], [address] [place], [place] [address]      Provider Number: [id number]  Attending Physician Name and Address:  Maretta Bees, MD  Relative Name and Phone Number:       Current Level of Care: Hospital Recommended Level of Care: Skilled Nursing Facility Prior Approval Number:    Date Approved/Denied:   PASRR Number: [id number] E expires on 09/14/2023  Discharge Plan: SNF    Current Diagnoses: Patient Active Problem List   Diagnosis Date Noted   Rhabdomyolysis 08/10/2023   AKI (acute kidney injury) (HCC) 08/10/2023   Transaminitis 08/10/2023   CAP (community acquired pneumonia) 08/10/2023   Lactic acidosis 08/10/2023   Elevated troponin 08/10/2023   Pressure injury of skin with suspected deep tissue injury 08/10/2023   Porokeratosis 01/02/2023   Dementia due to Alzheimer's disease (HCC) 08/11/2022   Statin myopathy 04/21/2022   Pain due to onychomycosis of toenails of both feet 02/22/2022   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) 11/22/2021   Chronic pain of right hip 09/15/2021   Chronic pain of left knee 09/15/2021   Chronic left shoulder pain 05/24/2021   Mixed hyperlipidemia 03/22/2021   Pincer nail deformity 11/03/2020   Diabetes type 2, controlled (HCC) 06/11/2019   Schizophrenia (HCC) 03/20/2019   Chronic diarrhea 03/20/2019   Hypertension 08/22/2016   Anemia 08/22/2016   Major neurocognitive disorder due to multiple etiologies with behavioral disturbance (HCC) 08/22/2016   Osteoporosis 08/22/2016   Asthma, moderate persistent 09/08/2011   Diverticulosis 09/08/2011   Former smoker 09/08/2011   GERD 06/04/2007    Orientation RESPIRATION BLADDER Height & Weight      Self, Place  Normal Continent, Indwelling catheter Weight: 168 lb 6.9 oz (76.4 kg) Height:  5\' 5"  (165.1 cm)  BEHAVIORAL SYMPTOMS/MOOD NEUROLOGICAL BOWEL NUTRITION STATUS      Incontinent Diet (See dc summary)  AMBULATORY STATUS COMMUNICATION OF NEEDS Skin   Extensive Assist Verbally Other (Comment) (non pressure wounds; shoulder; wound on hip)                       Personal Care Assistance Level of Assistance  Bathing, Feeding, Dressing Bathing Assistance: Maximum assistance Feeding assistance: Limited assistance Dressing Assistance: Limited assistance     Functional Limitations Info             SPECIAL CARE FACTORS FREQUENCY  PT (By licensed PT), OT (By licensed OT)     PT Frequency: 5x/week OT Frequency: 5x/week            Contractures Contractures Info: Not present    Additional Factors Info  Code Status, Allergies, Insulin Sliding Scale Code Status Info: Full Allergies Info: Crestor (Rosuvastatin)   Insulin Sliding Scale Info: See dc summary       Current Medications (08/16/2023):  This is the current hospital active medication list Current Facility-Administered Medications  Medication Dose Route Frequency Provider Last Rate Last Admin   0.9 %  sodium chloride infusion   Intravenous Continuous Maretta Bees, MD 50 mL/hr at 08/14/23 1051 Rate Change at 08/14/23 1051   acetaminophen (TYLENOL) tablet 650 mg  650 mg Oral Q6H PRN Hillary Bow, DO   650 mg at 08/15/23 2153  Or   acetaminophen (TYLENOL) suppository 650 mg  650 mg Rectal Q6H PRN Hillary Bow, DO       Chlorhexidine Gluconate Cloth 2 % PADS 6 each  6 each Topical Daily Maretta Bees, MD   6 each at 08/16/23 0416   cloZAPine (CLOZARIL) tablet 200 mg  200 mg Oral QHS Maretta Bees, MD   200 mg at 08/15/23 2145   donepezil (ARICEPT) tablet 10 mg  10 mg Oral QHS Maretta Bees, MD   10 mg at 08/15/23 2145   enoxaparin (LOVENOX) injection 40 mg  40 mg Subcutaneous Q24H  Lyda Perone M, DO   40 mg at 08/15/23 1320   insulin aspart (novoLOG) injection 0-9 Units  0-9 Units Subcutaneous TID WC Hillary Bow, DO   2 Units at 08/15/23 1644   memantine (NAMENDA) tablet 5 mg  5 mg Oral BID Maretta Bees, MD   5 mg at 08/15/23 2145   ondansetron (ZOFRAN) tablet 4 mg  4 mg Oral Q6H PRN Hillary Bow, DO       Or   ondansetron [facility]) injection 4 mg  4 mg Intravenous Q6H PRN Hillary Bow, DO       Oral care mouth rinse  15 mL Mouth Rinse PRN Maretta Bees, MD       tamsulosin [facility]) capsule 0.4 mg  0.4 mg Oral Daily Maretta Bees, MD   0.4 mg at 08/15/23 4098     Discharge Medications: Please see discharge summary for a list of discharge medications.  Relevant Imaging Results:  Relevant Lab Results:   Additional Information SSN: [id number] [place], [place]

## 2023-08-16 NOTE — TOC Progression Note (Addendum)
Transition of Care [facility]) - Progression Note    Patient Details  Name: Elizabeth Schwartz MRN: [id number] Date of Birth: October 20, 1946  Transition of Care [facility]) CM/SW Contact  Mearl Latin, LCSW Phone Number: 08/16/2023, 9:19 AM  Clinical Narrative:    9am-Pasrr received and placed on Fl2. Awaiting SNF decision from patient's niece.   2pm-CSW touched base with patient's niece and she stated she had car trouble and would have a SNF decision for CSW by 5pm.   5:30 PM-Niece has selected [facility]. CSW made facility aware and will begin insurance authorization process. CSW also sent request to Financial Counseling to assist niece with a Medicaid application if possible for any future needs.    Expected Discharge Plan: Skilled Nursing Facility Barriers to Discharge: Continued Medical Work up, English as a second language teacher  Expected Discharge Plan and Services In-house Referral: Clinical Social Work   Post Acute Care Choice: Skilled Nursing Facility Living arrangements for the past 2 months: Apartment                                       Social Determinants of Health (SDOH) Interventions SDOH Screenings   Food Insecurity: No Food Insecurity (03/22/2023)  Housing: Patient Unable To Answer (03/22/2023)  Transportation Needs: No Transportation Needs (03/22/2023)  Utilities: Not At Risk (03/22/2023)  Alcohol Screen: Low Risk  (03/22/2023)  Depression (PHQ2-9): Low Risk  (07/13/2023)  Financial Resource Strain: Low Risk  (03/22/2023)  Physical Activity: Insufficiently Active (03/22/2023)  Social Connections: Socially Isolated (03/22/2023)  Stress: No Stress Concern Present (03/22/2023)  Tobacco Use: Medium Risk (08/09/2023)    Readmission Risk Interventions     No data to display

## 2023-08-16 NOTE — Progress Notes (Signed)
PROGRESS NOTE        PATIENT DETAILS Name: Elizabeth Schwartz Age: 77 y.o. Sex: female Date of Birth: [birth year] Admit Date: 08/09/2023 Admitting Physician Dorcas Carrow, MD NWG:[id number], Charlcie Cradle, MD  Brief Summary: Patient is a 76 y.o.  female with history of HTN, DM-2, depression/schizoaffective disorder presented with a fall-neighbors found her pinned to the ground with the stove on top of her.  She was subsequently brought to the ED-she was found to have compressive injury to her left thigh along with AKI and rhabdomyolysis.  Significant events: 9/25>> admit to [facility]  Significant studies: 9/25>> CXR: Small pleural effusion. 9/25>> x-ray left femur: Negative for fracture 9/25>> x-ray pelvis:No fracture. 9/25>> CT head: No acute intracranial process. 9/25>> CT C-spine no acute 9/25>> CT chest/abdomen/pelvis: No acute posttraumatic sequelae. 9/27>> CT L-spine: No fracture 9/27 >>CT T-spine: No fracture 9/27>> MRI lumbar/thoracic spine: No acute abnormality.  Significant microbiology data: 9/25>> COVID/influenza/RSV PCR: Negative 9/25>> culture: No growth  Procedures: None  Consults: Orthopedics Neurology  Subjective: No major issues-just about able to move LLE side-to-side today.  Continues to have pain and swelling of her thigh area.  Objective: Vitals: Blood pressure 139/85, pulse 96, temperature 98 F (36.7 C), temperature source Oral, resp. rate 11, height 5\' 5"  (1.651 m), weight 76.4 kg, SpO2 95%.   Exam: Gen Exam:not in any distress HEENT:atraumatic, normocephalic Chest: B/L clear to auscultation anteriorly CVS:S1S2 regular Abdomen:soft non tender, non distended Extremities: Just about able to move LLE side-to-side.  Still with some left thigh swelling. Neurology: Non focal Skin: no rash  Pertinent Labs/Radiology:    Latest Ref Rng & Units 08/15/2023    4:13 AM 08/12/2023   11:57 AM 08/11/2023    3:30 PM  CBC  WBC 4.0 - 10.5 K/uL 6.2   7.1  8.3   Hemoglobin 12.0 - 15.0 g/dL 08.6  8.7  9.2   Hematocrit 36.0 - 46.0 % 32.3  28.1  29.1   Platelets 150 - 400 K/uL 187  221  223     Lab Results  Component Value Date   NA 139 08/16/2023   K 3.7 08/16/2023   CL 107 08/16/2023   CO2 20 (L) 08/16/2023      Assessment/Plan: Trauma from stove falling on her left thigh Left thigh swelling secondary to compression injury Ortho following-no signs of compartment  syndrome. PT/OT  Bilateral lower extremity weakness (left> right) Difficult exam due to cognitive issues/pain-suspect this is more of an issue with cognitive issues/rhabdomyolysis/compressive neuropathy rather than anything significant pathology.  Slowly improving-just about able to move LLE side-to-side-pain seems to be limiting factor. Neurology evaluation 9/28-no further workup recommended Continue to work with PT/OT.    Acute urinary retention Noted on 9/29-subsequently started on Foley/Flomax Voiding trial when patient is a bit more ambulatory-and swelling in the left thigh has improved.  AKI Resolved Likely due to rhabdo  Rhabdomyolysis Secondary to compression from stove-on left thigh CK trend continues to slowly improve  Transaminitis Secondary to rhabdo Downtrending Trend LFTs  Elevated troponins Secondary to rhabdo Echo stable per cardiology  PNA Afebrile Completed empiric treatment with Rocephin/Zithromax.   Currently asymptomatic.  HLD Holding Zetia/fenofibrate until rhabdomyolysis improves  DM-2 (A1c 6.1 on 8/29) CBG stable SSI  Recent Labs    08/15/23 2244 08/16/23 0734 08/16/23 1135  GLUCAP 128* 131* 151*    Dementia  Mood disorder ?  Cognitive dysfunction at baseline Continue clozapine/Aricept/Namenda  Some mild delirium occasionally-maintain delirium precautions. PT/OT/TOC eval-SNF plan.   BMI: Estimated body mass index is 28.03 kg/m as calculated from the following:   Height as of this encounter: 5\' 5"  (1.651  m).   Weight as of this encounter: 76.4 kg.   Code status:   Code Status: Full Code   DVT Prophylaxis: enoxaparin (LOVENOX) injection 40 mg Start: 08/10/23 1400    Family Communication: Niece [name]   Disposition Plan: Status is: Inpatient Remains inpatient appropriate because: Severity of illness   Planned Discharge Destination:Skilled nursing facility   Diet: Diet Order             Diet Heart Room service appropriate? Yes; Fluid consistency: Thin  Diet effective now                     Antimicrobial agents: Anti-infectives (From admission, onward)    Start     Dose/Rate Route Frequency Ordered Stop   08/10/23 0300  azithromycin (ZITHROMAX) 500 mg in sodium chloride 0.9 % 250 mL IVPB  Status:  Discontinued        500 mg 250 mL/hr over 60 Minutes Intravenous Every 24 hours 08/10/23 0208 08/12/23 1047   08/09/23 2000  cefTRIAXone (ROCEPHIN) 2 g in sodium chloride 0.9 % 100 mL IVPB        2 g 200 mL/hr over 30 Minutes Intravenous Every 24 hours 08/09/23 1949 08/13/23 2019        MEDICATIONS: Scheduled Meds:  Chlorhexidine Gluconate Cloth  6 each Topical Daily   clozapine  200 mg Oral QHS   donepezil  10 mg Oral QHS   enoxaparin (LOVENOX) injection  40 mg Subcutaneous Q24H   insulin aspart  0-9 Units Subcutaneous TID WC   memantine  5 mg Oral BID   tamsulosin  0.4 mg Oral Daily   Continuous Infusions:   PRN Meds:.acetaminophen **OR** acetaminophen, ondansetron **OR** ondansetron (ZOFRAN) IV, mouth rinse   I have personally reviewed following labs and imaging studies  LABORATORY DATA: CBC: Recent Labs  Lab 08/09/23 2042 08/10/23 0408 08/11/23 1530 08/12/23 1157 08/15/23 0413  WBC 14.4* 9.0 8.3 7.1 6.2  NEUTROABS 12.1*  --  5.7  --   --   HGB 13.0 11.0* 9.2* 8.7* 10.2*  HCT 41.8 34.5* 29.1* 28.1* 32.3*  MCV 95.0 95.3 96.0 94.3 95.0  PLT 283 238 223 221 187    Basic Metabolic Panel: Recent Labs  Lab 08/11/23 1530  08/12/23 0627 08/13/23 1208 08/14/23 0437 08/15/23 0413 08/16/23 0355  NA 141 139 138 139 138 139  K 3.5 3.6 4.0 3.5 3.7 3.7  CL 107 108 106 107 108 107  CO2 24 23 21* 23 22 20*  GLUCOSE 126* 150* 144* 155* 159* 139*  BUN 35* 30* 21 16 11 11   CREATININE 1.05* 1.01* 0.90 1.11* 0.79 0.75  CALCIUM 7.4* 7.3* 7.5* 7.7* 7.7* 8.0*  MG 1.7  --   --   --   --   --     GFR: Estimated Creatinine Clearance: 60.2 mL/min (by C-G formula based on SCr of 0.75 mg/dL).  Liver Function Tests: Recent Labs  Lab 08/11/23 1530 08/12/23 0627 08/13/23 1208 08/14/23 0437 08/15/23 0413  AST 195* 160* 105* 87* 64*  ALT 77* 66* 63* 59* 54*  ALKPHOS 49 60 71 79 69  BILITOT 0.3 0.4 0.5 0.4 0.3  PROT 5.3* 4.8* 4.8* 5.1* 5.0*  ALBUMIN  2.3* 2.0* 1.9* 2.0* 2.1*   No results for input(s): "LIPASE", "AMYLASE" in the last 168 hours. No results for input(s): "AMMONIA" in the last 168 hours.  Coagulation Profile: Recent Labs  Lab 08/09/23 2042  INR 1.1    Cardiac Enzymes: Recent Labs  Lab 08/11/23 1530 08/13/23 1208 08/14/23 0437 08/15/23 0413 08/16/23 0355  CKTOTAL 13,813* 5,560* 3,979* 2,528* 1,494*    BNP (last 3 results) No results for input(s): "PROBNP" in the last 8760 hours.  Lipid Profile: No results for input(s): "CHOL", "HDL", "LDLCALC", "TRIG", "CHOLHDL", "LDLDIRECT" in the last 72 hours.  Thyroid Function Tests: No results for input(s): "TSH", "T4TOTAL", "FREET4", "T3FREE", "THYROIDAB" in the last 72 hours.   Anemia Panel: No results for input(s): "VITAMINB12", "FOLATE", "FERRITIN", "TIBC", "IRON", "RETICCTPCT" in the last 72 hours.   Urine analysis:    Component Value Date/Time   COLORURINE YELLOW 08/09/2023 2042   APPEARANCEUR CLEAR 08/09/2023 2042   LABSPEC 1.018 08/09/2023 2042   PHURINE 5.0 08/09/2023 2042   GLUCOSEU NEGATIVE 08/09/2023 2042   HGBUR LARGE (A) 08/09/2023 2042   BILIRUBINUR NEGATIVE 08/09/2023 2042   KETONESUR 5 (A) 08/09/2023 2042   PROTEINUR  30 (A) 08/09/2023 2042   UROBILINOGEN 1.0 09/03/2009 1956   NITRITE NEGATIVE 08/09/2023 2042   LEUKOCYTESUR NEGATIVE 08/09/2023 2042    Sepsis Labs: Lactic Acid, Venous    Component Value Date/Time   LATICACIDVEN 2.0 (HH) 08/10/2023 0416    MICROBIOLOGY: Recent Results (from the past 240 hour(s))  Resp panel by RT-PCR (RSV, Flu A&B, Covid) Urine, Catheterized     Status: None   Collection Time: 08/09/23  8:42 PM   Specimen: Urine, Catheterized; Nasal Swab  Result Value Ref Range Status   SARS Coronavirus 2 by RT PCR NEGATIVE NEGATIVE Final   Influenza A by PCR NEGATIVE NEGATIVE Final   Influenza B by PCR NEGATIVE NEGATIVE Final    Comment: (NOTE) The Xpert Xpress SARS-CoV-2/FLU/RSV plus assay is intended as an aid in the diagnosis of influenza from Nasopharyngeal swab specimens and should not be used as a sole basis for treatment. Nasal washings and aspirates are unacceptable for Xpert Xpress SARS-CoV-2/FLU/RSV testing.  Fact Sheet for Patients: BloggerCourse.com  Fact Sheet for Healthcare Providers: SeriousBroker.it  This test is not yet approved or cleared by the [place] FDA and has been authorized for detection and/or diagnosis of SARS-CoV-2 by FDA under an Emergency Use Authorization (EUA). This EUA will remain in effect (meaning this test can be used) for the duration of the COVID-19 declaration under Section 564(b)(1) of the Act, 21 U.S.C. section 360bbb-3(b)(1), unless the authorization is terminated or revoked.     Resp Syncytial Virus by PCR NEGATIVE NEGATIVE Final    Comment: (NOTE) Fact Sheet for Patients: BloggerCourse.com  Fact Sheet for Healthcare Providers: SeriousBroker.it  This test is not yet approved or cleared by the [place] FDA and has been authorized for detection and/or diagnosis of SARS-CoV-2 by FDA under an Emergency Use  Authorization (EUA). This EUA will remain in effect (meaning this test can be used) for the duration of the COVID-19 declaration under Section 564(b)(1) of the Act, 21 U.S.C. section 360bbb-3(b)(1), unless the authorization is terminated or revoked.  Performed at [facility], [address] [place], [place] [address]   Blood Culture (routine x 2)     Status: None   Collection Time: 08/09/23  8:42 PM   Specimen: BLOOD RIGHT HAND  Result Value Ref Range Status   Specimen Description  BLOOD RIGHT HAND  Final   Special Requests   Final    BOTTLES DRAWN AEROBIC AND ANAEROBIC Blood Culture adequate volume   Culture   Final    NO GROWTH 5 DAYS Performed at [facility], [address] [place], [place] [address]    Report Status 08/14/2023 FINAL  Final  Blood Culture (routine x 2)     Status: None   Collection Time: 08/09/23  8:42 PM   Specimen: BLOOD LEFT HAND  Result Value Ref Range Status   Specimen Description BLOOD LEFT HAND  Final   Special Requests   Final    BOTTLES DRAWN AEROBIC AND ANAEROBIC Blood Culture adequate volume   Culture   Final    NO GROWTH 5 DAYS Performed at [facility], [address], [place], [place] [address]    Report Status 08/14/2023 FINAL  Final    RADIOLOGY STUDIES/RESULTS: No results found.   LOS: 6 days   Jeoffrey Massed, MD  [facility]    To contact the attending provider between 7A-7P or the covering provider during after hours 7P-7A, please log into the web site [email or web address] and access using universal [place] password for that web site. If you do not have the password, please call the hospital operator.  08/16/2023, 12:27 PM

## 2023-08-16 NOTE — Progress Notes (Signed)
Physical Therapy Treatment Patient Details Name: Elizabeth Schwartz MRN: [id number] DOB: 21-Jul-1946 Today's Date: 08/16/2023   History of Present Illness Pt is a 77 y/o F admitted on 08/09/23. EMS was called for welfare check & pt was found on the ground pinned underneath the stove door, pt was confused. Pt is being treated for acute rhabdomyolysis & L thigh injury. PMH: dementia, HTN, schizoaffective disorder, DM2    PT Comments  Pt in bed upon arrival and agreeable to PT session. Worked on LE strength and ROM as well as transfers in today's session. Pt continues to have 1/5 strength in L LE with decreased sensation to light touch. Pt is currently limited by 8/10 pain in L quad with movement. Pt was able to stand with ModAx2 and RW, however, pt was unable to bear weight fully on L LE or lift B LE. Pt was able to tolerate ~1 min in standing with MinAx2 for balance before requesting to sit down. Pt also has <70 degrees ROM in L shoulder flexion and ABD with pain, - drop arm test. Pt is progressing slowly towards goals. Acute PT to follow.      If plan is discharge home, recommend the following: Two people to help with walking and/or transfers;Two people to help with bathing/dressing/bathroom;Direct supervision/assist for medications management;Help with stairs or ramp for entrance;Assist for transportation;Assistance with feeding;Assistance with cooking/housework;Direct supervision/assist for financial management   Can travel by private vehicle     No        Precautions / Restrictions Precautions Precautions: Fall Restrictions Weight Bearing Restrictions: No     Mobility  Bed Mobility Overal bed mobility: Needs Assistance Bed Mobility: Supine to Sit, Sit to Supine     Supine to sit: Max assist, HOB elevated, Used rails, +2 for physical assistance Sit to supine: Total assist, +2 for physical assistance   General bed mobility comments: cues for sequencing. MaxAx2 to totalAx2 for LE management  and trunk elevation with pad assist. Difficulty lifting L UE to grab onto rail. MaxA to shift hips towards EOB    Transfers Overall transfer level: Needs assistance Equipment used: Rolling walker (2 wheels) Transfers: Sit to/from Stand Sit to Stand: +2 physical assistance, +2 safety/equipment, Mod assist           General transfer comment: ModAx2 for boost up, initial rise, and steady. Pt with decreased WB on L LE, unable to reach full knee extension. Unable to lift LE in standing or weight shift    Ambulation/Gait    General Gait Details: nt     Balance Overall balance assessment: Needs assistance Sitting-balance support: Feet supported, Bilateral upper extremity supported Sitting balance-Leahy Scale: Poor Sitting balance - Comments: ModAx1 to CGA sitting EOB. Pt likes to have 1UE hand hold to support self Postural control: Posterior lean Standing balance support: Bilateral upper extremity supported, Reliant on assistive device for balance, During functional activity Standing balance-Leahy Scale: Poor Standing balance comment: Min A +2 with RW         Cognition Arousal: Alert Behavior During Therapy: WFL for tasks assessed/performed Overall Cognitive Status: No family/caregiver present to determine baseline cognitive functioning Area of Impairment: Orientation    Orientation Level: Disoriented to, Time     Following Commands: Follows one step commands consistently       Exercises General Exercises - Lower Extremity Ankle Circles/Pumps: AROM, AAROM, Right, Left, 5 reps, Seated Long Arc Quad: AAROM, AROM, 5 reps, Left, Right, Seated (slight AAROM with L LE, AROM with R  LE) Heel Slides: AAROM, Left, 5 reps, Seated    General Comments General comments (skin integrity, edema, etc.): VSS on RA      Pertinent Vitals/Pain Pain Assessment Faces Pain Scale: Hurts whole lot Pain Location: L thigh Pain Descriptors / Indicators: Aching, Sore Pain Intervention(s):  Limited activity within patient's tolerance, Monitored during session, Repositioned           PT Goals (current goals can now be found in the care plan section) Progress towards PT goals: Progressing toward goals    Frequency    Min 1X/week           Co-evaluation PT/OT/SLP Co-Evaluation/Treatment: Yes Reason for Co-Treatment: For patient/therapist safety;To address functional/ADL transfers PT goals addressed during session: Mobility/safety with mobility;Proper use of DME;Balance;Strengthening/ROM        AM-PAC PT "6 Clicks" Mobility   Outcome Measure  Help needed turning from your back to your side while in a flat bed without using bedrails?: A Lot Help needed moving from lying on your back to sitting on the side of a flat bed without using bedrails?: Total Help needed moving to and from a bed to a chair (including a wheelchair)?: A Lot Help needed standing up from a chair using your arms (e.g., wheelchair or bedside chair)?: A Lot Help needed to walk in hospital room?: Total Help needed climbing 3-5 steps with a railing? : Total 6 Click Score: 9    End of Session Equipment Utilized During Treatment: Gait belt Activity Tolerance: Patient limited by pain Patient left: in bed;with call bell/phone within reach;with bed alarm set Nurse Communication: Mobility status PT Visit Diagnosis: Difficulty in walking, not elsewhere classified (R26.2);Muscle weakness (generalized) (M62.81);Unsteadiness on feet (R26.81);Other abnormalities of gait and mobility (R26.89)     Time: 1005-1035 PT Time Calculation (min) (ACUTE ONLY): 30 min  Charges:    $Therapeutic Exercise: 8-22 mins PT General Charges $$ ACUTE PT VISIT: 1 Visit                    Hilton Cork, PT, DPT Secure Chat Preferred  Rehab Office [phone]   Arturo Morton Brion Aliment 08/16/2023, 11:02 AM

## 2023-08-16 NOTE — Plan of Care (Signed)
  Problem: Education: Goal: Ability to describe self-care measures that may prevent or decrease complications (Diabetes Survival Skills Education) will improve Outcome: Progressing Goal: Individualized Educational Video(s) Outcome: Progressing   Problem: Coping: Goal: Ability to adjust to condition or change in health will improve Outcome: Progressing   Problem: Fluid Volume: Goal: Ability to maintain a balanced intake and output will improve Outcome: Progressing   Problem: Metabolic: Goal: Ability to maintain appropriate glucose levels will improve Outcome: Progressing   Problem: Nutritional: Goal: Maintenance of adequate nutrition will improve Outcome: Progressing

## 2023-08-16 NOTE — Progress Notes (Signed)
Occupational Therapy Treatment Patient Details Name: Elizabeth Schwartz MRN: [id number] DOB: January 24, 1946 Today's Date: 08/16/2023   History of present illness Pt is a 77 y/o F admitted on 08/09/23. EMS was called for welfare check & pt was found on the ground pinned underneath the stove door, pt was confused. Pt is being treated for acute rhabdomyolysis & L thigh injury. PMH: dementia, HTN, schizoaffective disorder, DM2   OT comments  Pt continuing to need significant assist with bed mobility and balance. Pt progressed throughout session and was able to tolerate EOB sitting to assess ROM capabilities of her LLE. Pt limited by her L thigh pain, not able to maneuver it sitting EOB or use it to offload in standing. Pt also displaying significant pain with ROM of her LUE. Notified PA to determine the need for imaging in the event of any rotator injury. OT to continue to progress pt as able. DC plans remain appropriate for SNF.      If plan is discharge home, recommend the following:  Two people to help with walking and/or transfers;A lot of help with bathing/dressing/bathroom;Assistance with cooking/housework;Direct supervision/assist for financial management;Help with stairs or ramp for entrance;Direct supervision/assist for medications management;Supervision due to cognitive status   Equipment Recommendations  None recommended by OT (TBD at next level of care)    Recommendations for Other Services      Precautions / Restrictions Precautions Precautions: Fall Restrictions Weight Bearing Restrictions: No       Mobility Bed Mobility Overal bed mobility: Needs Assistance Bed Mobility: Supine to Sit, Sit to Supine     Supine to sit: Max assist, HOB elevated, Used rails, +2 for physical assistance Sit to supine: Total assist, +2 for physical assistance   General bed mobility comments: cues for sequencing. MaxAx2 to totalAx2 for LE management and trunk elevation with pad assist. Difficulty lifting  L UE to grab onto rail. MaxA to shift hips towards EOB    Transfers Overall transfer level: Needs assistance Equipment used: Rolling walker (2 wheels) Transfers: Sit to/from Stand Sit to Stand: +2 physical assistance, +2 safety/equipment, Mod assist           General transfer comment: ModAx2 for boost up, initial rise, and steady. Pt with decreased WB on L LE, unable to reach full knee extension. Unable to lift LE in standing or weight shift     Balance Overall balance assessment: Needs assistance Sitting-balance support: Feet supported, Bilateral upper extremity supported Sitting balance-Leahy Scale: Poor Sitting balance - Comments: ModA x1 to CGA sitting EOB. Pt likes to have 1UE hand hold to support self Postural control: Posterior lean Standing balance support: Bilateral upper extremity supported, Reliant on assistive device for balance, During functional activity Standing balance-Leahy Scale: Poor Standing balance comment: Min A +2 with RW                           ADL either performed or assessed with clinical judgement   ADL Overall ADL's : Needs assistance/impaired                                       General ADL Comments: Attempted to progress with OOB mobility at the time. Pt not tolerating/offloading to produce steps    Extremity/Trunk Assessment Upper Extremity Assessment Upper Extremity Assessment: LUE deficits/detail LUE Deficits / Details: L shoulder flexion limited to 60*, restricted by  pain. LUE: Shoulder pain with ROM            Vision       Perception     Praxis      Cognition Arousal: Alert Behavior During Therapy: WFL for tasks assessed/performed Overall Cognitive Status: No family/caregiver present to determine baseline cognitive functioning Area of Impairment: Orientation                 Orientation Level: Disoriented to, Time (Pt unsure of month and day)     Following Commands: Follows one step  commands consistently                Exercises      Shoulder Instructions       General Comments VSS on RA    Pertinent Vitals/ Pain       Pain Assessment Pain Assessment: Faces Faces Pain Scale: Hurts whole lot Pain Location: L thigh Pain Descriptors / Indicators: Aching, Sore Pain Intervention(s): Limited activity within patient's tolerance, Monitored during session, Repositioned  Home Living                                          Prior Functioning/Environment              Frequency  Min 1X/week        Progress Toward Goals  OT Goals(current goals can now be found in the care plan section)  Progress towards OT goals: Progressing toward goals  Acute Rehab OT Goals Patient Stated Goal: none stated OT Goal Formulation: Patient unable to participate in goal setting Time For Goal Achievement: 08/25/23 Potential to Achieve Goals: Good ADL Goals Pt Will Perform Grooming: with set-up;sitting;with supervision Pt Will Perform Lower Body Bathing: sitting/lateral leans;with adaptive equipment;with contact guard assist Pt Will Perform Lower Body Dressing: sitting/lateral leans;with min assist;with adaptive equipment Pt Will Transfer to Toilet: stand pivot transfer;with mod assist;bedside commode  Plan      Co-evaluation    PT/OT/SLP Co-Evaluation/Treatment: Yes Reason for Co-Treatment: For patient/therapist safety;To address functional/ADL transfers PT goals addressed during session: Mobility/safety with mobility;Proper use of DME;Balance;Strengthening/ROM OT goals addressed during session: ADL's and self-care;Proper use of Adaptive equipment and DME      AM-PAC OT "6 Clicks" Daily Activity     Outcome Measure   Help from another person eating meals?: A Little Help from another person taking care of personal grooming?: A Lot Help from another person toileting, which includes using toliet, bedpan, or urinal?: Total Help from another  person bathing (including washing, rinsing, drying)?: A Lot Help from another person to put on and taking off regular upper body clothing?: A Lot Help from another person to put on and taking off regular lower body clothing?: Total 6 Click Score: 11    End of Session Equipment Utilized During Treatment: Gait belt;Rolling walker (2 wheels)  OT Visit Diagnosis: Unsteadiness on feet (R26.81);Other abnormalities of gait and mobility (R26.89);Other symptoms and signs involving cognitive function;Muscle weakness (generalized) (M62.81)   Activity Tolerance Patient limited by pain   Patient Left in bed;with call bell/phone within reach;with bed alarm set   Nurse Communication Mobility status (+2 Mod to Max)        Time: 9629-5284 OT Time Calculation (min): 29 min  Charges: OT General Charges $OT Visit: 1 Visit OT Treatments $Therapeutic Activity: 8-22 mins  08/16/2023  AB, OTR/L  Acute Rehabilitation Services  Office: [phone]   Tristan Schroeder 08/16/2023, 2:27 PM

## 2023-08-16 NOTE — Plan of Care (Signed)
Pt has rested quietly throughout the night with no distress noted. Alert and oriented to person and place. On room air. SR/ST. Foley intact to BSD. Medicated for pain with relief  noted. No other complaints voiced. Bath given, skin care, linens changed. Dressing changed to left top thigh. Pt refuses to get up for breakfast in the chair, stating she has not been out of bed.     Problem: Education: Goal: Ability to describe self-care measures that may prevent or decrease complications (Diabetes Survival Skills Education) will improve Outcome: Progressing Goal: Individualized Educational Video(s) Outcome: Progressing   Problem: Coping: Goal: Ability to adjust to condition or change in health will improve Outcome: Progressing   Problem: Fluid Volume: Goal: Ability to maintain a balanced intake and output will improve Outcome: Progressing   Problem: Health Behavior/Discharge Planning: Goal: Ability to identify and utilize available resources and services will improve Outcome: Progressing Goal: Ability to manage health-related needs will improve Outcome: Progressing   Problem: Metabolic: Goal: Ability to maintain appropriate glucose levels will improve Outcome: Progressing   Problem: Nutritional: Goal: Maintenance of adequate nutrition will improve Outcome: Progressing Goal: Progress toward achieving an optimal weight will improve Outcome: Progressing   Problem: Skin Integrity: Goal: Risk for impaired skin integrity will decrease Outcome: Progressing   Problem: Tissue Perfusion: Goal: Adequacy of tissue perfusion will improve Outcome: Progressing   Problem: Education: Goal: Knowledge of General Education information will improve Description: Including pain rating scale, medication(s)/side effects and non-pharmacologic comfort measures Outcome: Progressing   Problem: Health Behavior/Discharge Planning: Goal: Ability to manage health-related needs will improve Outcome:  Progressing   Problem: Clinical Measurements: Goal: Ability to maintain clinical measurements within normal limits will improve Outcome: Progressing Goal: Will remain free from infection Outcome: Progressing Goal: Diagnostic test results will improve Outcome: Progressing Goal: Respiratory complications will improve Outcome: Progressing Goal: Cardiovascular complication will be avoided Outcome: Progressing   Problem: Activity: Goal: Risk for activity intolerance will decrease Outcome: Progressing   Problem: Nutrition: Goal: Adequate nutrition will be maintained Outcome: Progressing   Problem: Coping: Goal: Level of anxiety will decrease Outcome: Progressing   Problem: Elimination: Goal: Will not experience complications related to bowel motility Outcome: Progressing Goal: Will not experience complications related to urinary retention Outcome: Progressing   Problem: Pain Managment: Goal: General experience of comfort will improve Outcome: Progressing   Problem: Safety: Goal: Ability to remain free from injury will improve Outcome: Progressing   Problem: Skin Integrity: Goal: Risk for impaired skin integrity will decrease Outcome: Progressing

## 2023-08-17 DIAGNOSIS — M6282 Rhabdomyolysis: Secondary | ICD-10-CM | POA: Diagnosis not present

## 2023-08-17 DIAGNOSIS — G309 Alzheimer's disease, unspecified: Secondary | ICD-10-CM | POA: Diagnosis not present

## 2023-08-17 DIAGNOSIS — R7989 Other specified abnormal findings of blood chemistry: Secondary | ICD-10-CM | POA: Diagnosis not present

## 2023-08-17 DIAGNOSIS — N179 Acute kidney failure, unspecified: Secondary | ICD-10-CM | POA: Diagnosis not present

## 2023-08-17 LAB — COMPREHENSIVE METABOLIC PANEL
ALT: 42 U/L (ref 0–44)
AST: 39 U/L (ref 15–41)
Albumin: 2.2 g/dL — ABNORMAL LOW (ref 3.5–5.0)
Alkaline Phosphatase: 55 U/L (ref 38–126)
Anion gap: 12 (ref 5–15)
BUN: 12 mg/dL (ref 8–23)
CO2: 22 mmol/L (ref 22–32)
Calcium: 8 mg/dL — ABNORMAL LOW (ref 8.9–10.3)
Chloride: 104 mmol/L (ref 98–111)
Creatinine, Ser: 0.88 mg/dL (ref 0.44–1.00)
GFR, Estimated: >60 mL/min (ref >60)
Glucose, Bld: 148 mg/dL — ABNORMAL HIGH (ref 70–99)
Potassium: 3.5 mmol/L (ref 3.5–5.1)
Sodium: 138 mmol/L (ref 135–145)
Total Bilirubin: 0.3 mg/dL (ref 0.3–1.2)
Total Protein: 4.8 g/dL — ABNORMAL LOW (ref 6.5–8.1)

## 2023-08-17 LAB — GLUCOSE, CAPILLARY
Glucose-Capillary: 130 mg/dL — ABNORMAL HIGH (ref 70–99)
Glucose-Capillary: 97 mg/dL (ref 70–99)
Glucose-Capillary: 124 mg/dL — ABNORMAL HIGH (ref 70–99)
Glucose-Capillary: 126 mg/dL — ABNORMAL HIGH (ref 70–99)

## 2023-08-17 LAB — CK: Total CK: 866 U/L — ABNORMAL HIGH (ref 38–234)

## 2023-08-17 NOTE — Progress Notes (Signed)
PROGRESS NOTE        PATIENT DETAILS Name: Elizabeth Schwartz Age: 77 y.o. Sex: female Date of Birth: 18-May-1946 Admit Date: 08/09/2023 Admitting Physician Dorcas Carrow, MD [id number], Charlcie Cradle, MD  Brief Summary: Patient is a 77 y.o.  female with history of HTN, DM-2, depression/schizoaffective disorder presented with a fall-neighbors found her pinned to the ground with the stove on top of her.  She was subsequently brought to the ED-she was found to have compressive injury to her left thigh along with AKI and rhabdomyolysis.  Significant events: 9/25>> admit to [facility]  Significant studies: 9/25>> CXR: Small pleural effusion. 9/25>> x-ray left femur: Negative for fracture 9/25>> x-ray pelvis:No fracture. 9/25>> CT head: No acute intracranial process. 9/25>> CT C-spine no acute 9/25>> CT chest/abdomen/pelvis: No acute posttraumatic sequelae. 9/27>> CT L-spine: No fracture 9/27 >>CT T-spine: No fracture 9/27>> MRI lumbar/thoracic spine: No acute abnormality.  Significant microbiology data: 9/25>> COVID/influenza/RSV PCR: Negative 9/25>> culture: No growth  Procedures: None  Consults: Orthopedics Neurology  Subjective: Able move LLE side-to-side.  Swelling persistent left thigh but  better.  Objective: Vitals: Blood pressure (!) 102/34, pulse 92, temperature 98 F (36.7 C), temperature source Oral, resp. rate 19, height 5\' 5"  (1.651 m), weight 76.4 kg, SpO2 95%.   Exam: Gen Exam:Alert awake-not in any distress HEENT:atraumatic, normocephalic Chest: B/L clear to auscultation anteriorly CVS:S1S2 regular Abdomen:soft non tender, non distended Extremities: Moving LLE side-to-side-sensation grossly intact.  Easily moving RLE. Neurology: Non focal Skin: no rash  Pertinent Labs/Radiology:    Latest Ref Rng & Units 08/15/2023    4:13 AM 08/12/2023   11:57 AM 08/11/2023    3:30 PM  CBC  WBC 4.0 - 10.5 K/uL 6.2  7.1  8.3   Hemoglobin 12.0 - 15.0 g/dL  96.2  8.7  9.2   Hematocrit 36.0 - 46.0 % 32.3  28.1  29.1   Platelets 150 - 400 K/uL 187  221  223     Lab Results  Component Value Date   NA 138 08/17/2023   K 3.5 08/17/2023   CL 104 08/17/2023   CO2 22 08/17/2023      Assessment/Plan: Trauma from stove falling on her left thigh Left thigh swelling secondary to compression injury Ortho following-no signs of compartment  syndrome. PT/OT  Bilateral lower extremity weakness (left> right) Difficult exam due to cognitive issues/pain-suspect this is more of an issue with cognitive issues/rhabdomyolysis/compressive neuropathy rather than anything significant pathology.  Slowly improving-now slowly able to move LLE more freely-although still cytocide.  Pain could needs to be a limiting factor as well. Neurology evaluation 9/28-no further workup recommended Continue to work with PT/OT.    Acute urinary retention Noted on 9/29-subsequently started on Foley/Flomax Attempt voiding trial today-remove Foley.    AKI Resolved Likely due to rhabdo  Rhabdomyolysis Secondary to compression from stove-on left thigh CK trend continues to slowly improve  Transaminitis Secondary to rhabdo Downtrending Trend LFTs  Elevated troponins Secondary to rhabdo Echo stable per cardiology  PNA Afebrile Completed empiric treatment with Rocephin/Zithromax.   Currently asymptomatic.  HLD Holding Zetia/fenofibrate until rhabdomyolysis improves  DM-2 (A1c 6.1 on 8/29) CBG stable SSI  Recent Labs    08/16/23 1135 08/16/23 1630 08/17/23 0842  GLUCAP 151* 149* 130*    Dementia Mood disorder ?  Cognitive dysfunction at baseline Continue clozapine/Aricept/Namenda  Some mild  delirium occasionally-maintain delirium precautions. PT/OT/TOC eval-SNF plan.   BMI: Estimated body mass index is 28.03 kg/m as calculated from the following:   Height as of this encounter: 5\' 5"  (1.651 m).   Weight as of this encounter: 76.4 kg.   Code  status:   Code Status: Full Code   DVT Prophylaxis: enoxaparin (LOVENOX) injection 40 mg Start: 08/10/23 1400    Family Communication: Niece [name]-[phone]/27   Disposition Plan: Status is: Inpatient Remains inpatient appropriate because: Severity of illness   Planned Discharge Destination:Skilled nursing facility   Diet: Diet Order             Diet Heart Room service appropriate? Yes; Fluid consistency: Thin  Diet effective now                     Antimicrobial agents: Anti-infectives (From admission, onward)    Start     Dose/Rate Route Frequency Ordered Stop   08/10/23 0300  azithromycin (ZITHROMAX) 500 mg in sodium chloride 0.9 % 250 mL IVPB  Status:  Discontinued        500 mg 250 mL/hr over 60 Minutes Intravenous Every 24 hours 08/10/23 0208 08/12/23 1047   08/09/23 2000  cefTRIAXone (ROCEPHIN) 2 g in sodium chloride 0.9 % 100 mL IVPB        2 g 200 mL/hr over 30 Minutes Intravenous Every 24 hours 08/09/23 1949 08/13/23 2019        MEDICATIONS: Scheduled Meds:  Chlorhexidine Gluconate Cloth  6 each Topical Daily   clozapine  200 mg Oral QHS   donepezil  10 mg Oral QHS   enoxaparin (LOVENOX) injection  40 mg Subcutaneous Q24H   insulin aspart  0-9 Units Subcutaneous TID WC   memantine  5 mg Oral BID   tamsulosin  0.4 mg Oral Daily   Continuous Infusions:   PRN Meds:.acetaminophen **OR** acetaminophen, ondansetron **OR** ondansetron (ZOFRAN) IV, mouth rinse   I have personally reviewed following labs and imaging studies  LABORATORY DATA: CBC: Recent Labs  Lab 08/11/23 1530 08/12/23 1157 08/15/23 0413  WBC 8.3 7.1 6.2  NEUTROABS 5.7  --   --   HGB 9.2* 8.7* 10.2*  HCT 29.1* 28.1* 32.3*  MCV 96.0 94.3 95.0  PLT 223 221 187    Basic Metabolic Panel: Recent Labs  Lab 08/11/23 1530 08/12/23 0627 08/13/23 1208 08/14/23 0437 08/15/23 0413 08/16/23 0355 08/17/23 0437  NA 141   < > 138 139 138 139 138  K 3.5   < > 4.0 3.5 3.7  3.7 3.5  CL 107   < > 106 107 108 107 104  CO2 24   < > 21* 23 22 20* 22  GLUCOSE 126*   < > 144* 155* 159* 139* 148*  BUN 35*   < > 21 16 11 11 12   CREATININE 1.05*   < > 0.90 1.11* 0.79 0.75 0.88  CALCIUM 7.4*   < > 7.5* 7.7* 7.7* 8.0* 8.0*  MG 1.7  --   --   --   --   --   --    < > = values in this interval not displayed.    GFR: Estimated Creatinine Clearance: 54.8 mL/min (by C-G formula based on SCr of 0.88 mg/dL).  Liver Function Tests: Recent Labs  Lab 08/12/23 0627 08/13/23 1208 08/14/23 0437 08/15/23 0413 08/17/23 0437  AST 160* 105* 87* 64* 39  ALT 66* 63* 59* 54* 42  ALKPHOS 60 71 79 69  55  BILITOT 0.4 0.5 0.4 0.3 0.3  PROT 4.8* 4.8* 5.1* 5.0* 4.8*  ALBUMIN 2.0* 1.9* 2.0* 2.1* 2.2*   No results for input(s): "LIPASE", "AMYLASE" in the last 168 hours. No results for input(s): "AMMONIA" in the last 168 hours.  Coagulation Profile: No results for input(s): "INR", "PROTIME" in the last 168 hours.   Cardiac Enzymes: Recent Labs  Lab 08/13/23 1208 08/14/23 0437 08/15/23 0413 08/16/23 0355 08/17/23 0437  CKTOTAL 5,560* 3,979* 2,528* 1,494* 866*    BNP (last 3 results) No results for input(s): "PROBNP" in the last 8760 hours.  Lipid Profile: No results for input(s): "CHOL", "HDL", "LDLCALC", "TRIG", "CHOLHDL", "LDLDIRECT" in the last 72 hours.  Thyroid Function Tests: No results for input(s): "TSH", "T4TOTAL", "FREET4", "T3FREE", "THYROIDAB" in the last 72 hours.   Anemia Panel: No results for input(s): "VITAMINB12", "FOLATE", "FERRITIN", "TIBC", "IRON", "RETICCTPCT" in the last 72 hours.   Urine analysis:    Component Value Date/Time   COLORURINE YELLOW 08/09/2023 2042   APPEARANCEUR CLEAR 08/09/2023 2042   LABSPEC 1.018 08/09/2023 2042   PHURINE 5.0 08/09/2023 2042   GLUCOSEU NEGATIVE 08/09/2023 2042   HGBUR LARGE (A) 08/09/2023 2042   BILIRUBINUR NEGATIVE 08/09/2023 2042   KETONESUR 5 (A) 08/09/2023 2042   PROTEINUR 30 (A) 08/09/2023 2042    UROBILINOGEN 1.0 09/03/2009 1956   NITRITE NEGATIVE 08/09/2023 2042   LEUKOCYTESUR NEGATIVE 08/09/2023 2042    Sepsis Labs: Lactic Acid, Venous    Component Value Date/Time   LATICACIDVEN 2.0 (HH) 08/10/2023 0416    MICROBIOLOGY: Recent Results (from the past 240 hour(s))  Resp panel by RT-PCR (RSV, Flu A&B, Covid) Urine, Catheterized     Status: None   Collection Time: 08/09/23  8:42 PM   Specimen: Urine, Catheterized; Nasal Swab  Result Value Ref Range Status   SARS Coronavirus 2 by RT PCR NEGATIVE NEGATIVE Final   Influenza A by PCR NEGATIVE NEGATIVE Final   Influenza B by PCR NEGATIVE NEGATIVE Final    Comment: (NOTE) The Xpert Xpress SARS-CoV-2/FLU/RSV plus assay is intended as an aid in the diagnosis of influenza from Nasopharyngeal swab specimens and should not be used as a sole basis for treatment. Nasal washings and aspirates are unacceptable for Xpert Xpress SARS-CoV-2/FLU/RSV testing.  Fact Sheet for Patients: BloggerCourse.com  Fact Sheet for Healthcare Providers: SeriousBroker.it  This test is not yet approved or cleared by the [place] FDA and has been authorized for detection and/or diagnosis of SARS-CoV-2 by FDA under an Emergency Use Authorization (EUA). This EUA will remain in effect (meaning this test can be used) for the duration of the COVID-19 declaration under Section 564(b)(1) of the Act, 21 U.S.C. section 360bbb-3(b)(1), unless the authorization is terminated or revoked.     Resp Syncytial Virus by PCR NEGATIVE NEGATIVE Final    Comment: (NOTE) Fact Sheet for Patients: BloggerCourse.com  Fact Sheet for Healthcare Providers: SeriousBroker.it  This test is not yet approved or cleared by the [place] FDA and has been authorized for detection and/or diagnosis of SARS-CoV-2 by FDA under an Emergency Use Authorization (EUA). This EUA  will remain in effect (meaning this test can be used) for the duration of the COVID-19 declaration under Section 564(b)(1) of the Act, 21 U.S.C. section 360bbb-3(b)(1), unless the authorization is terminated or revoked.  Performed at [facility], [address], [place], [place] [address]   Blood Culture (routine x 2)     Status: None   Collection Time: 08/09/23  8:42 PM   Specimen: BLOOD RIGHT HAND  Result Value Ref Range Status   Specimen Description BLOOD RIGHT HAND  Final   Special Requests   Final    BOTTLES DRAWN AEROBIC AND ANAEROBIC Blood Culture adequate volume   Culture   Final    NO GROWTH 5 DAYS Performed at [facility], [address] [place], [place] [address]    Report Status 08/14/2023 FINAL  Final  Blood Culture (routine x 2)     Status: None   Collection Time: 08/09/23  8:42 PM   Specimen: BLOOD LEFT HAND  Result Value Ref Range Status   Specimen Description BLOOD LEFT HAND  Final   Special Requests   Final    BOTTLES DRAWN AEROBIC AND ANAEROBIC Blood Culture adequate volume   Culture   Final    NO GROWTH 5 DAYS Performed at [facility], [address] [place], [place] [address]    Report Status 08/14/2023 FINAL  Final    RADIOLOGY STUDIES/RESULTS: No results found.   LOS: 7 days   Jeoffrey Massed, MD  [facility]    To contact the attending provider between 7A-7P or the covering provider during after hours 7P-7A, please log into the web site [email or web address] and access using universal [place] password for that web site. If you do not have the password, please call the hospital operator.  08/17/2023, 10:51 AM

## 2023-08-17 NOTE — TOC Progression Note (Signed)
Transition of Care [facility]) - Progression Note    Patient Details  Name: Elizabeth Schwartz MRN: [id number] Date of Birth: [birth year]  Transition of Care [facility]) CM/SW Contact  Mearl Latin, LCSW Phone Number: 08/17/2023, 4:25 PM  Clinical Narrative:    CSW met with patient as she is more oriented at this time. She was very pleasant and reported agreement with discharge to [facility] when stable. She reported agreement with [facility] for transport and stated CSW can call her social worker at her apartment to let her know her plan. CSW updated patient's niece.   Insurance approval received for [facility], Ref# [id number], effective 08/17/2023-08/21/2023.    Expected Discharge Plan: Skilled Nursing Facility Barriers to Discharge: Continued Medical Work up  Expected Discharge Plan and Services In-house Referral: Clinical Social Work   Post Acute Care Choice: Skilled Nursing Facility Living arrangements for the past 2 months: Apartment                                       Social Determinants of Health (SDOH) Interventions SDOH Screenings   Food Insecurity: No Food Insecurity (03/22/2023)  Housing: Patient Unable To Answer (03/22/2023)  Transportation Needs: No Transportation Needs (03/22/2023)  Utilities: Not At Risk (03/22/2023)  Alcohol Screen: Low Risk  (03/22/2023)  Depression (PHQ2-9): Low Risk  (07/13/2023)  Financial Resource Strain: Low Risk  (03/22/2023)  Physical Activity: Insufficiently Active (03/22/2023)  Social Connections: Socially Isolated (03/22/2023)  Stress: No Stress Concern Present (03/22/2023)  Tobacco Use: Medium Risk (08/09/2023)    Readmission Risk Interventions     No data to display

## 2023-08-17 NOTE — Progress Notes (Signed)
Physical Therapy Treatment Patient Details Name: Elizabeth Schwartz MRN: [id number] DOB: Jun 07, 1946 Today's Date: 08/17/2023   History of Present Illness Pt is a 77 y/o F admitted on 08/09/23. EMS was called for welfare check & pt was found on the ground pinned underneath the stove door, pt was confused. Pt is being treated for acute rhabdomyolysis & L thigh injury. PMH: dementia, HTN, schizoaffective disorder, DM2    PT Comments  Patient up in chair on arrival and requesting back to bed. RN reports up for several hours. Patient requires max cues for sequencing scooting out to edge of chair, bringing feet underneath her, and pushing off surface. +2 mod assist to come to stand. Pivotal steps to her right with pt able to bear some weight through LLE to advance RLE. Also able to advance LLE for small steps. Overall, making steady progress.     If plan is discharge home, recommend the following: Two people to help with walking and/or transfers;Two people to help with bathing/dressing/bathroom;Direct supervision/assist for medications management;Help with stairs or ramp for entrance;Assist for transportation;Assistance with feeding;Assistance with cooking/housework;Direct supervision/assist for financial management   Can travel by private vehicle     No  Equipment Recommendations  Wheelchair (measurements PT);Wheelchair cushion (measurements PT);Hoyer lift    Recommendations for [employer]       Precautions / Restrictions Precautions Precautions: Fall Restrictions Weight Bearing Restrictions: No     Mobility  Bed Mobility Overal bed mobility: Needs Assistance Bed Mobility: Sit to Supine       Sit to supine: Mod assist   General bed mobility comments: assist to raise LLE onto bed; vc to "get straight" including moving legs to center of bed, moving hips and shoulders    Transfers Overall transfer level: Needs assistance Equipment used: Rolling walker (2 wheels) Transfers: Sit to/from  Stand Sit to Stand: +2 physical assistance, Mod assist Stand pivot transfers:  (significant assist, posterior lean) Step pivot transfers: Mod assist, +2 physical assistance       General transfer comment: ModAx2 for boost up, initial rise, and steady. Pt with decreased WB on L LE, unable to reach full knee extension. Able to advance LLE very small steps    Ambulation/Gait               General Gait Details: nt   Stairs             Wheelchair Mobility     Tilt Bed    Modified Rankin (Stroke Patients Only)       Balance Overall balance assessment: Needs assistance Sitting-balance support: Feet supported, No upper extremity supported Sitting balance-Leahy Scale: Fair     Standing balance support: Bilateral upper extremity supported, Reliant on assistive device for balance, During functional activity Standing balance-Leahy Scale: Poor Standing balance comment: Min A +2 with RW                            Cognition Arousal: Alert Behavior During Therapy: WFL for tasks assessed/performed Overall Cognitive Status: No family/caregiver present to determine baseline cognitive functioning Area of Impairment: Orientation, Memory, Safety/judgement, Problem solving                 Orientation Level: Disoriented to, Time (Pt unsure of month and day)       Safety/Judgement: Decreased awareness of safety, Decreased awareness of deficits   Problem Solving: Slow processing, Decreased initiation, Difficulty sequencing, Requires verbal cues, Requires tactile cues General  Comments: incr time for processing cues with some repetition necessary        Exercises      General Comments        Pertinent Vitals/Pain Pain Assessment Pain Assessment: Faces Faces Pain Scale: Hurts even more Pain Location: L thigh Pain Descriptors / Indicators: Aching, Sore Pain Intervention(s): Limited activity within patient's tolerance, Monitored during session     Home Living                          Prior Function            PT Goals (current goals can now be found in the care plan section) Acute Rehab PT Goals Patient Stated Goal: get stronger Time For Goal Achievement: 08/25/23 Potential to Achieve Goals: Fair Progress towards PT goals: Progressing toward goals    Frequency    Min 1X/week      PT Plan      Co-evaluation              AM-PAC PT "6 Clicks" Mobility   Outcome Measure  Help needed turning from your back to your side while in a flat bed without using bedrails?: A Lot Help needed moving from lying on your back to sitting on the side of a flat bed without using bedrails?: Total Help needed moving to and from a bed to a chair (including a wheelchair)?: Total Help needed standing up from a chair using your arms (e.g., wheelchair or bedside chair)?: Total Help needed to walk in hospital room?: Total Help needed climbing 3-5 steps with a railing? : Total 6 Click Score: 7    End of Session Equipment Utilized During Treatment: Gait belt Activity Tolerance: Patient limited by fatigue Patient left: in bed;with call bell/phone within reach;with bed alarm set;with nursing/sitter in room Nurse Communication: Mobility status PT Visit Diagnosis: Difficulty in walking, not elsewhere classified (R26.2);Muscle weakness (generalized) (M62.81);Unsteadiness on feet (R26.81);Other abnormalities of gait and mobility (R26.89)     Time: 1610-9604 PT Time Calculation (min) (ACUTE ONLY): 17 min  Charges:    $Therapeutic Activity: 8-22 mins PT General Charges $$ ACUTE PT VISIT: 1 Visit                      Jerolyn Center, PT Acute Rehabilitation Services  Office [phone]    Zena Amos 08/17/2023, 4:17 PM

## 2023-08-17 NOTE — Progress Notes (Signed)
Secure chat sent to nurse regarding PIV placement. No running infusions or other procedures noted at this time to substantiate PIV placement. Educated nurse on vein preservation. Tomasita Morrow, RN VAST

## 2023-08-17 NOTE — Plan of Care (Signed)

## 2023-08-18 DIAGNOSIS — E0865 Diabetes mellitus due to underlying condition with hyperglycemia: Secondary | ICD-10-CM | POA: Diagnosis not present

## 2023-08-18 DIAGNOSIS — L97122 Non-pressure chronic ulcer of left thigh with fat layer exposed: Secondary | ICD-10-CM | POA: Diagnosis not present

## 2023-08-18 DIAGNOSIS — M19012 Primary osteoarthritis, left shoulder: Secondary | ICD-10-CM | POA: Diagnosis not present

## 2023-08-18 DIAGNOSIS — Z743 Need for continuous supervision: Secondary | ICD-10-CM | POA: Diagnosis not present

## 2023-08-18 DIAGNOSIS — N179 Acute kidney failure, unspecified: Secondary | ICD-10-CM | POA: Diagnosis not present

## 2023-08-18 DIAGNOSIS — E1121 Type 2 diabetes mellitus with diabetic nephropathy: Secondary | ICD-10-CM

## 2023-08-18 DIAGNOSIS — M25462 Effusion, left knee: Secondary | ICD-10-CM | POA: Diagnosis not present

## 2023-08-18 DIAGNOSIS — L98495 Non-pressure chronic ulcer of skin of other sites with muscle involvement without evidence of necrosis: Secondary | ICD-10-CM | POA: Diagnosis not present

## 2023-08-18 DIAGNOSIS — R7989 Other specified abnormal findings of blood chemistry: Secondary | ICD-10-CM | POA: Diagnosis not present

## 2023-08-18 DIAGNOSIS — G8929 Other chronic pain: Secondary | ICD-10-CM | POA: Diagnosis not present

## 2023-08-18 DIAGNOSIS — R6889 Other general symptoms and signs: Secondary | ICD-10-CM | POA: Diagnosis not present

## 2023-08-18 DIAGNOSIS — M6259 Muscle wasting and atrophy, not elsewhere classified, multiple sites: Secondary | ICD-10-CM | POA: Diagnosis not present

## 2023-08-18 DIAGNOSIS — E785 Hyperlipidemia, unspecified: Secondary | ICD-10-CM | POA: Diagnosis not present

## 2023-08-18 DIAGNOSIS — I10 Essential (primary) hypertension: Secondary | ICD-10-CM | POA: Diagnosis not present

## 2023-08-18 DIAGNOSIS — G309 Alzheimer's disease, unspecified: Secondary | ICD-10-CM | POA: Diagnosis not present

## 2023-08-18 DIAGNOSIS — R404 Transient alteration of awareness: Secondary | ICD-10-CM | POA: Diagnosis not present

## 2023-08-18 DIAGNOSIS — M25512 Pain in left shoulder: Secondary | ICD-10-CM | POA: Diagnosis not present

## 2023-08-18 DIAGNOSIS — R1310 Dysphagia, unspecified: Secondary | ICD-10-CM | POA: Diagnosis not present

## 2023-08-18 DIAGNOSIS — L97823 Non-pressure chronic ulcer of other part of left lower leg with necrosis of muscle: Secondary | ICD-10-CM | POA: Diagnosis not present

## 2023-08-18 DIAGNOSIS — L97813 Non-pressure chronic ulcer of other part of right lower leg with necrosis of muscle: Secondary | ICD-10-CM | POA: Diagnosis not present

## 2023-08-18 DIAGNOSIS — K219 Gastro-esophageal reflux disease without esophagitis: Secondary | ICD-10-CM | POA: Diagnosis not present

## 2023-08-18 DIAGNOSIS — L98419 Non-pressure chronic ulcer of buttock with unspecified severity: Secondary | ICD-10-CM | POA: Diagnosis not present

## 2023-08-18 DIAGNOSIS — M25562 Pain in left knee: Secondary | ICD-10-CM | POA: Diagnosis not present

## 2023-08-18 DIAGNOSIS — M6282 Rhabdomyolysis: Secondary | ICD-10-CM | POA: Diagnosis not present

## 2023-08-18 DIAGNOSIS — Z7401 Bed confinement status: Secondary | ICD-10-CM | POA: Diagnosis not present

## 2023-08-18 DIAGNOSIS — L97123 Non-pressure chronic ulcer of left thigh with necrosis of muscle: Secondary | ICD-10-CM | POA: Diagnosis not present

## 2023-08-18 DIAGNOSIS — M6281 Muscle weakness (generalized): Secondary | ICD-10-CM | POA: Diagnosis not present

## 2023-08-18 LAB — GLUCOSE, CAPILLARY
Glucose-Capillary: 125 mg/dL — ABNORMAL HIGH (ref 70–99)
Glucose-Capillary: 115 mg/dL — ABNORMAL HIGH (ref 70–99)

## 2023-08-18 MED ORDER — ACETAMINOPHEN 325 MG PO TABS: 650.0000 mg | ORAL_TABLET | Freq: Four times a day (QID) | ORAL | Status: AC | PRN

## 2023-08-18 MED ORDER — NOVOLOG FLEXPEN 100 UNIT/ML ~~LOC~~ SOPN: PEN_INJECTOR | SUBCUTANEOUS | Status: DC

## 2023-08-18 NOTE — TOC Transition Note (Signed)
Transition of Care [facility]) - CM/SW Discharge Note   Patient Details  Name: Elizabeth Schwartz MRN: [id number] Date of Birth: 03-25-1946  Transition of Care [facility]) CM/SW Contact:  Mearl Latin, LCSW Phone Number: 08/18/2023, 1:03 PM   Clinical Narrative:    Patient will DC to: [facility] Anticipated DC date: 08/18/23 Family notified: Niece, Bonita Quin; [place] French Ana Transport by: Sharin Mons   Per MD patient ready for DC to [facility]. RN to call report prior to discharge [phone] option 0 for RN, room 3246). RN, patient, patient's family, and facility notified of DC. Discharge Summary and FL2 sent to facility. DC packet on chart. Ambulance transport requested for patient.   CSW will sign off for now as social work intervention is no longer needed. Please consult [place] again if new needs arise.     Final next level of care: Skilled Nursing Facility Barriers to Discharge: Barriers Resolved   Patient Goals and CMS Choice CMS Medicare.gov Compare Post Acute Care list provided to:: Patient Represenative (must comment) Choice offered to / list presented to :  (social worker at [facility])  Discharge Placement     Existing PASRR number confirmed : 08/18/23          Patient [facility] bed at: [facility] Patient to be transferred to facility by: [facility] Name of family member notified: Niece Patient and family notified of of transfer: 08/18/23  Discharge Plan and Services Additional resources added to the After Visit Summary for   In-house Referral: Clinical Social Work   Post Acute Care Choice: Skilled Nursing Facility                               Social Determinants of Health (SDOH) Interventions SDOH Screenings   Food Insecurity: No Food Insecurity (03/22/2023)  Housing: Patient Unable To Answer (03/22/2023)  Transportation Needs: No Transportation Needs (03/22/2023)  Utilities: Not At Risk (03/22/2023)  Alcohol Screen: Low Risk  (03/22/2023)  Depression (PHQ2-9): Low Risk   (07/13/2023)  Financial Resource Strain: Low Risk  (03/22/2023)  Physical Activity: Insufficiently Active (03/22/2023)  Social Connections: Socially Isolated (03/22/2023)  Stress: No Stress Concern Present (03/22/2023)  Tobacco Use: Medium Risk (08/09/2023)     Readmission Risk Interventions     No data to display

## 2023-08-18 NOTE — Progress Notes (Signed)
Mobility Specialist Progress Note;   08/18/23 1215  Mobility  Activity Transferred from bed to chair  Level of Assistance Moderate assist, patient does 50-74% (+2)  Assistive Device Front wheel walker  Activity Response Tolerated well  Mobility Referral Yes  $Mobility charge 1 Mobility  Mobility Specialist Start Time (ACUTE ONLY) 1215  Mobility Specialist Stop Time (ACUTE ONLY) 1230  Mobility Specialist Time Calculation (min) (ACUTE ONLY) 15 min   Pt agreeable to mobility. Required ModA+2 to transfer B>C. Pt asx throughout and left in chair with all needs met. Alarm on.   Caesar Bookman Mobility Specialist Please contact via SecureChat or Rehab Office [phone]

## 2023-08-18 NOTE — Plan of Care (Signed)

## 2023-08-18 NOTE — Plan of Care (Signed)

## 2023-08-18 NOTE — Discharge Summary (Addendum)
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 9/29-subsequently started on Foley/Flomax Foley removed 10/3-voiding spontaneously.   AKI Resolved Likely due to rhabdo   Rhabdomyolysis Secondary to compression from stove-on left thigh CK trend continues to slowly improve   Transaminitis Secondary to rhabdo Downtrending Trend LFTs   Elevated troponins Secondary to rhabdo Echo stable per cardiology   PNA Afebrile Completed empiric treatment with Rocephin/Zithromax.   Currently asymptomatic.   HLD Holding Zetia/fenofibrate until rhabdomyolysis improves   DM-2 (A1c 6.1 on 8/29) CBG stable SSI  Dementia Mood disorder ?  Cognitive dysfunction at baseline Continue clozapine/Aricept/Namenda  Some mild delirium occasionally-maintain delirium precautions. PT/OT/TOC eval-SNF plan.    BMI: Estimated body mass index is 28.03 kg/m as calculated from the following:   Height as of this encounter: 5\' 5"  (1.651 m).   Weight as of this encounter: 76.4 kg.   BMI: Estimated body mass index is 28.03 kg/m as calculated from the following:   Height as of this encounter: 5\' 5"  (1.651 m).   Weight as of this encounter: 76.4 kg.    Discharge Diagnoses:  Principal Problem:   Rhabdomyolysis  the lungs due to respiratory motion artifact. Dependent atelectasis in the left lower lobe. Disc levels: No evidence of high-grade spinal canal or neural foraminal stenosis in the thoracic spine. Partially visualized spondylosis in the cervical spine, more fully evaluated on the recent dedicated cervical spine CT. IMPRESSION: 1. No acute osseous abnormality identified in the thoracic spine. 2. Thoracic spondylosis without high-grade stenosis. 3.  Aortic Atherosclerosis (ICD10-I70.0). Electronically Signed   By: Sebastian Ache M.D.   On: 08/11/2023 14:16   CT LUMBAR SPINE WO CONTRAST  Result Date: 08/11/2023 CLINICAL DATA:  Trauma. Status post fall. Bilateral lower extremity weakness. EXAM: CT LUMBAR SPINE WITHOUT CONTRAST TECHNIQUE: Multidetector CT imaging of the lumbar spine was performed without intravenous contrast administration. Multiplanar CT image reconstructions were also generated. RADIATION DOSE REDUCTION: This exam was performed according to the departmental dose-optimization program which includes automated exposure control, adjustment of the mA and/or kV according to patient size and/or use of iterative reconstruction  technique. COMPARISON:  08/09/2023 FINDINGS: Segmentation: 5 lumbar type vertebrae. Alignment: Normal. Vertebrae: No acute fracture or focal pathologic process. Paraspinal and other soft tissues: Negative. Disc levels: The disc spaces are relatively well preserved. Mild multi level ventral endplate spurring is noted. IMPRESSION: 1. No evidence for lumbar spine fracture or subluxation. 2. Mild multilevel ventral endplate spurring. Electronically Signed   By: Signa Kell M.D.   On: 08/11/2023 13:02   ECHOCARDIOGRAM COMPLETE  Result Date: 08/11/2023    ECHOCARDIOGRAM REPORT   Patient Name:   LUCAS EXLINE Date of Exam: 08/11/2023 Medical Rec #:  [id number]   Height:       65.0 in Accession #:    [id number]  Weight:       168.4 lb Date of Birth:  [birth year]   BSA:          1.839 m Patient Age:    77 years    BP:           104/89 mmHg Patient Gender: F           HR:           103 bpm. Exam Location:  Inpatient Procedure: 2D Echo, Cardiac Doppler and Color Doppler Indications:    Syncope  History:        Patient has no prior history of Echocardiogram examinations.                 COPD, Signs/Symptoms:Syncope and Dementia; Risk                 Factors:Hypertension, Dyslipidemia and Diabetes.  Sonographer:    Milda Smart Referring Phys: [id number] MIHAI CROITORU  Sonographer Comments: Image acquisition challenging due to respiratory motion. IMPRESSIONS  1. Left ventricular ejection fraction, by estimation, is 65 to 70%. The left ventricle has normal function. The left ventricle has no regional wall motion abnormalities. There is mild concentric left ventricular hypertrophy. Indeterminate diastolic filling due to E-A fusion.  2. Right ventricular systolic function is normal. The right ventricular size is normal. There is normal pulmonary artery systolic pressure.  3. The mitral valve is normal in structure. No evidence of mitral valve regurgitation. No evidence of mitral stenosis.  4. The aortic valve is tricuspid. Aortic  valve regurgitation is not visualized. No aortic stenosis is present.  5. The inferior vena cava is normal in size with greater than 50% respiratory variability, suggesting right atrial pressure of 3 mmHg. FINDINGS  Left Ventricle: Left ventricular ejection fraction, by estimation, is 65 to 70%.  PATIENT DETAILS Name: Elizabeth Schwartz Age: 77 y.o. Sex: female Date of Birth: [birth year] MRN: [id number]. Admitting Physician: Dorcas Carrow, MD WUJ:[id number], Charlcie Cradle, MD  Admit Date: 08/09/2023 Discharge date: 08/18/2023  Recommendations for Outpatient Follow-up:  Follow up with PCP in 1-2 weeks Please obtain CMP/CBC in one week  Admitted From:  Home  Disposition: SNF   Discharge Condition: good  CODE STATUS:   Code Status: Full Code   Diet recommendation:  Diet Order             Diet - low sodium heart healthy           Diet Heart Room service appropriate? Yes; Fluid consistency: Thin  Diet effective now                    Brief Summary: Patient is a 77 y.o.  female with history of HTN, DM-2, depression/schizoaffective disorder presented with a fall-neighbors found her pinned to the ground with the stove on top of her.  She was subsequently brought to the ED-she was found to have compressive injury to her left thigh along with AKI and rhabdomyolysis.   Significant events: 9/25>> admit to [facility]   Significant studies: 9/25>> CXR: Small pleural effusion. 9/25>> x-ray left femur: Negative for fracture 9/25>> x-ray pelvis:No fracture. 9/25>> CT head: No acute intracranial process. 9/25>> CT C-spine no acute 9/25>> CT chest/abdomen/pelvis: No acute posttraumatic sequelae. 9/27>> CT L-spine: No fracture 9/27 >>CT T-spine: No fracture 9/27>> MRI lumbar/thoracic spine: No acute abnormality.   Significant microbiology data: 9/25>> COVID/influenza/RSV PCR: Negative 9/25>> culture: No growth   Procedures: None   Consults: Orthopedics Neurology  Brief Hospital Course: Trauma from stove falling on her left thigh Left thigh swelling secondary to compression injury Ortho following-no signs of compartment  syndrome. Gradual improvement in the swelling of the left thigh with past several days.   Bilateral lower extremity weakness (left> right) Difficult exam  due to cognitive issues/pain-suspect this is more of an issue with cognitive issues/rhabdomyolysis/compressive neuropathy rather than anything significant pathology.  Slowly improving-mom movement in her left lower extremity for the past several days-now of somewhat more easily moving it side-to-side-just about able to somewhat bend the knee.  She was not able to do any of this on initial presentation. Extensive neuroimaging was negative-neurology followed-no further recommendations.  Continue to work with PT/OT.   Acute urinary retention Noted on 9/29-subsequently started on Foley/Flomax Foley removed 10/3-voiding spontaneously.   AKI Resolved Likely due to rhabdo   Rhabdomyolysis Secondary to compression from stove-on left thigh CK trend continues to slowly improve   Transaminitis Secondary to rhabdo Downtrending Trend LFTs   Elevated troponins Secondary to rhabdo Echo stable per cardiology   PNA Afebrile Completed empiric treatment with Rocephin/Zithromax.   Currently asymptomatic.   HLD Holding Zetia/fenofibrate until rhabdomyolysis improves   DM-2 (A1c 6.1 on 8/29) CBG stable SSI  Dementia Mood disorder ?  Cognitive dysfunction at baseline Continue clozapine/Aricept/Namenda  Some mild delirium occasionally-maintain delirium precautions. PT/OT/TOC eval-SNF plan.    BMI: Estimated body mass index is 28.03 kg/m as calculated from the following:   Height as of this encounter: 5\' 5"  (1.651 m).   Weight as of this encounter: 76.4 kg.   BMI: Estimated body mass index is 28.03 kg/m as calculated from the following:   Height as of this encounter: 5\' 5"  (1.651 m).   Weight as of this encounter: 76.4 kg.    Discharge Diagnoses:  Principal Problem:   Rhabdomyolysis  PATIENT DETAILS Name: Elizabeth Schwartz Age: 77 y.o. Sex: female Date of Birth: [birth year] MRN: [id number]. Admitting Physician: Dorcas Carrow, MD WUJ:[id number], Charlcie Cradle, MD  Admit Date: 08/09/2023 Discharge date: 08/18/2023  Recommendations for Outpatient Follow-up:  Follow up with PCP in 1-2 weeks Please obtain CMP/CBC in one week  Admitted From:  Home  Disposition: SNF   Discharge Condition: good  CODE STATUS:   Code Status: Full Code   Diet recommendation:  Diet Order             Diet - low sodium heart healthy           Diet Heart Room service appropriate? Yes; Fluid consistency: Thin  Diet effective now                    Brief Summary: Patient is a 77 y.o.  female with history of HTN, DM-2, depression/schizoaffective disorder presented with a fall-neighbors found her pinned to the ground with the stove on top of her.  She was subsequently brought to the ED-she was found to have compressive injury to her left thigh along with AKI and rhabdomyolysis.   Significant events: 9/25>> admit to [facility]   Significant studies: 9/25>> CXR: Small pleural effusion. 9/25>> x-ray left femur: Negative for fracture 9/25>> x-ray pelvis:No fracture. 9/25>> CT head: No acute intracranial process. 9/25>> CT C-spine no acute 9/25>> CT chest/abdomen/pelvis: No acute posttraumatic sequelae. 9/27>> CT L-spine: No fracture 9/27 >>CT T-spine: No fracture 9/27>> MRI lumbar/thoracic spine: No acute abnormality.   Significant microbiology data: 9/25>> COVID/influenza/RSV PCR: Negative 9/25>> culture: No growth   Procedures: None   Consults: Orthopedics Neurology  Brief Hospital Course: Trauma from stove falling on her left thigh Left thigh swelling secondary to compression injury Ortho following-no signs of compartment  syndrome. Gradual improvement in the swelling of the left thigh with past several days.   Bilateral lower extremity weakness (left> right) Difficult exam  due to cognitive issues/pain-suspect this is more of an issue with cognitive issues/rhabdomyolysis/compressive neuropathy rather than anything significant pathology.  Slowly improving-mom movement in her left lower extremity for the past several days-now of somewhat more easily moving it side-to-side-just about able to somewhat bend the knee.  She was not able to do any of this on initial presentation. Extensive neuroimaging was negative-neurology followed-no further recommendations.  Continue to work with PT/OT.   Acute urinary retention Noted on 9/29-subsequently started on Foley/Flomax Foley removed 10/3-voiding spontaneously.   AKI Resolved Likely due to rhabdo   Rhabdomyolysis Secondary to compression from stove-on left thigh CK trend continues to slowly improve   Transaminitis Secondary to rhabdo Downtrending Trend LFTs   Elevated troponins Secondary to rhabdo Echo stable per cardiology   PNA Afebrile Completed empiric treatment with Rocephin/Zithromax.   Currently asymptomatic.   HLD Holding Zetia/fenofibrate until rhabdomyolysis improves   DM-2 (A1c 6.1 on 8/29) CBG stable SSI  Dementia Mood disorder ?  Cognitive dysfunction at baseline Continue clozapine/Aricept/Namenda  Some mild delirium occasionally-maintain delirium precautions. PT/OT/TOC eval-SNF plan.    BMI: Estimated body mass index is 28.03 kg/m as calculated from the following:   Height as of this encounter: 5\' 5"  (1.651 m).   Weight as of this encounter: 76.4 kg.   BMI: Estimated body mass index is 28.03 kg/m as calculated from the following:   Height as of this encounter: 5\' 5"  (1.651 m).   Weight as of this encounter: 76.4 kg.    Discharge Diagnoses:  Principal Problem:   Rhabdomyolysis  the lungs due to respiratory motion artifact. Dependent atelectasis in the left lower lobe. Disc levels: No evidence of high-grade spinal canal or neural foraminal stenosis in the thoracic spine. Partially visualized spondylosis in the cervical spine, more fully evaluated on the recent dedicated cervical spine CT. IMPRESSION: 1. No acute osseous abnormality identified in the thoracic spine. 2. Thoracic spondylosis without high-grade stenosis. 3.  Aortic Atherosclerosis (ICD10-I70.0). Electronically Signed   By: Sebastian Ache M.D.   On: 08/11/2023 14:16   CT LUMBAR SPINE WO CONTRAST  Result Date: 08/11/2023 CLINICAL DATA:  Trauma. Status post fall. Bilateral lower extremity weakness. EXAM: CT LUMBAR SPINE WITHOUT CONTRAST TECHNIQUE: Multidetector CT imaging of the lumbar spine was performed without intravenous contrast administration. Multiplanar CT image reconstructions were also generated. RADIATION DOSE REDUCTION: This exam was performed according to the departmental dose-optimization program which includes automated exposure control, adjustment of the mA and/or kV according to patient size and/or use of iterative reconstruction  technique. COMPARISON:  08/09/2023 FINDINGS: Segmentation: 5 lumbar type vertebrae. Alignment: Normal. Vertebrae: No acute fracture or focal pathologic process. Paraspinal and other soft tissues: Negative. Disc levels: The disc spaces are relatively well preserved. Mild multi level ventral endplate spurring is noted. IMPRESSION: 1. No evidence for lumbar spine fracture or subluxation. 2. Mild multilevel ventral endplate spurring. Electronically Signed   By: Signa Kell M.D.   On: 08/11/2023 13:02   ECHOCARDIOGRAM COMPLETE  Result Date: 08/11/2023    ECHOCARDIOGRAM REPORT   Patient Name:   LUCAS EXLINE Date of Exam: 08/11/2023 Medical Rec #:  [id number]   Height:       65.0 in Accession #:    [id number]  Weight:       168.4 lb Date of Birth:  [birth year]   BSA:          1.839 m Patient Age:    77 years    BP:           104/89 mmHg Patient Gender: F           HR:           103 bpm. Exam Location:  Inpatient Procedure: 2D Echo, Cardiac Doppler and Color Doppler Indications:    Syncope  History:        Patient has no prior history of Echocardiogram examinations.                 COPD, Signs/Symptoms:Syncope and Dementia; Risk                 Factors:Hypertension, Dyslipidemia and Diabetes.  Sonographer:    Milda Smart Referring Phys: [id number] MIHAI CROITORU  Sonographer Comments: Image acquisition challenging due to respiratory motion. IMPRESSIONS  1. Left ventricular ejection fraction, by estimation, is 65 to 70%. The left ventricle has normal function. The left ventricle has no regional wall motion abnormalities. There is mild concentric left ventricular hypertrophy. Indeterminate diastolic filling due to E-A fusion.  2. Right ventricular systolic function is normal. The right ventricular size is normal. There is normal pulmonary artery systolic pressure.  3. The mitral valve is normal in structure. No evidence of mitral valve regurgitation. No evidence of mitral stenosis.  4. The aortic valve is tricuspid. Aortic  valve regurgitation is not visualized. No aortic stenosis is present.  5. The inferior vena cava is normal in size with greater than 50% respiratory variability, suggesting right atrial pressure of 3 mmHg. FINDINGS  Left Ventricle: Left ventricular ejection fraction, by estimation, is 65 to 70%.

## 2023-08-21 DIAGNOSIS — E0865 Diabetes mellitus due to underlying condition with hyperglycemia: Secondary | ICD-10-CM | POA: Diagnosis not present

## 2023-08-21 DIAGNOSIS — N179 Acute kidney failure, unspecified: Secondary | ICD-10-CM | POA: Diagnosis not present

## 2023-08-21 DIAGNOSIS — E785 Hyperlipidemia, unspecified: Secondary | ICD-10-CM | POA: Diagnosis not present

## 2023-08-21 DIAGNOSIS — K219 Gastro-esophageal reflux disease without esophagitis: Secondary | ICD-10-CM | POA: Diagnosis not present

## 2023-08-21 DIAGNOSIS — I10 Essential (primary) hypertension: Secondary | ICD-10-CM | POA: Diagnosis not present

## 2023-08-21 LAB — VITAMIN B1: Vitamin B1 (Thiamine): 64.6 nmol/L — ABNORMAL LOW (ref 66.5–200.0)

## 2023-08-22 DIAGNOSIS — E0865 Diabetes mellitus due to underlying condition with hyperglycemia: Secondary | ICD-10-CM | POA: Diagnosis not present

## 2023-08-22 DIAGNOSIS — I10 Essential (primary) hypertension: Secondary | ICD-10-CM | POA: Diagnosis not present

## 2023-08-22 DIAGNOSIS — N179 Acute kidney failure, unspecified: Secondary | ICD-10-CM | POA: Diagnosis not present

## 2023-08-22 DIAGNOSIS — K219 Gastro-esophageal reflux disease without esophagitis: Secondary | ICD-10-CM | POA: Diagnosis not present

## 2023-08-22 DIAGNOSIS — E785 Hyperlipidemia, unspecified: Secondary | ICD-10-CM | POA: Diagnosis not present

## 2023-08-23 ENCOUNTER — Ambulatory Visit: Payer: Medicare Other

## 2023-08-23 DIAGNOSIS — E785 Hyperlipidemia, unspecified: Secondary | ICD-10-CM | POA: Diagnosis not present

## 2023-08-23 DIAGNOSIS — N179 Acute kidney failure, unspecified: Secondary | ICD-10-CM | POA: Diagnosis not present

## 2023-08-23 DIAGNOSIS — I10 Essential (primary) hypertension: Secondary | ICD-10-CM | POA: Diagnosis not present

## 2023-08-23 DIAGNOSIS — K219 Gastro-esophageal reflux disease without esophagitis: Secondary | ICD-10-CM | POA: Diagnosis not present

## 2023-08-23 DIAGNOSIS — E0865 Diabetes mellitus due to underlying condition with hyperglycemia: Secondary | ICD-10-CM | POA: Diagnosis not present

## 2023-08-24 DIAGNOSIS — L97122 Non-pressure chronic ulcer of left thigh with fat layer exposed: Secondary | ICD-10-CM | POA: Diagnosis not present

## 2023-08-24 DIAGNOSIS — M6281 Muscle weakness (generalized): Secondary | ICD-10-CM | POA: Diagnosis not present

## 2023-08-24 DIAGNOSIS — L98419 Non-pressure chronic ulcer of buttock with unspecified severity: Secondary | ICD-10-CM | POA: Diagnosis not present

## 2023-08-29 DIAGNOSIS — I10 Essential (primary) hypertension: Secondary | ICD-10-CM | POA: Diagnosis not present

## 2023-08-29 DIAGNOSIS — N179 Acute kidney failure, unspecified: Secondary | ICD-10-CM | POA: Diagnosis not present

## 2023-08-29 DIAGNOSIS — K219 Gastro-esophageal reflux disease without esophagitis: Secondary | ICD-10-CM | POA: Diagnosis not present

## 2023-08-29 DIAGNOSIS — E0865 Diabetes mellitus due to underlying condition with hyperglycemia: Secondary | ICD-10-CM | POA: Diagnosis not present

## 2023-08-29 DIAGNOSIS — E785 Hyperlipidemia, unspecified: Secondary | ICD-10-CM | POA: Diagnosis not present

## 2023-08-31 DIAGNOSIS — L97823 Non-pressure chronic ulcer of other part of left lower leg with necrosis of muscle: Secondary | ICD-10-CM | POA: Diagnosis not present

## 2023-08-31 DIAGNOSIS — M6281 Muscle weakness (generalized): Secondary | ICD-10-CM | POA: Diagnosis not present

## 2023-08-31 DIAGNOSIS — L98419 Non-pressure chronic ulcer of buttock with unspecified severity: Secondary | ICD-10-CM | POA: Diagnosis not present

## 2023-08-31 DIAGNOSIS — L97122 Non-pressure chronic ulcer of left thigh with fat layer exposed: Secondary | ICD-10-CM | POA: Diagnosis not present

## 2023-09-04 DIAGNOSIS — K219 Gastro-esophageal reflux disease without esophagitis: Secondary | ICD-10-CM | POA: Diagnosis not present

## 2023-09-04 DIAGNOSIS — E785 Hyperlipidemia, unspecified: Secondary | ICD-10-CM | POA: Diagnosis not present

## 2023-09-04 DIAGNOSIS — N179 Acute kidney failure, unspecified: Secondary | ICD-10-CM | POA: Diagnosis not present

## 2023-09-04 DIAGNOSIS — I10 Essential (primary) hypertension: Secondary | ICD-10-CM | POA: Diagnosis not present

## 2023-09-04 DIAGNOSIS — E0865 Diabetes mellitus due to underlying condition with hyperglycemia: Secondary | ICD-10-CM | POA: Diagnosis not present

## 2023-09-06 DIAGNOSIS — E785 Hyperlipidemia, unspecified: Secondary | ICD-10-CM | POA: Diagnosis not present

## 2023-09-06 DIAGNOSIS — I10 Essential (primary) hypertension: Secondary | ICD-10-CM | POA: Diagnosis not present

## 2023-09-06 DIAGNOSIS — E0865 Diabetes mellitus due to underlying condition with hyperglycemia: Secondary | ICD-10-CM | POA: Diagnosis not present

## 2023-09-06 DIAGNOSIS — K219 Gastro-esophageal reflux disease without esophagitis: Secondary | ICD-10-CM | POA: Diagnosis not present

## 2023-09-06 DIAGNOSIS — N179 Acute kidney failure, unspecified: Secondary | ICD-10-CM | POA: Diagnosis not present

## 2023-09-07 DIAGNOSIS — L98419 Non-pressure chronic ulcer of buttock with unspecified severity: Secondary | ICD-10-CM | POA: Diagnosis not present

## 2023-09-07 DIAGNOSIS — M6281 Muscle weakness (generalized): Secondary | ICD-10-CM | POA: Diagnosis not present

## 2023-09-07 DIAGNOSIS — L97813 Non-pressure chronic ulcer of other part of right lower leg with necrosis of muscle: Secondary | ICD-10-CM | POA: Diagnosis not present

## 2023-09-07 DIAGNOSIS — L97122 Non-pressure chronic ulcer of left thigh with fat layer exposed: Secondary | ICD-10-CM | POA: Diagnosis not present

## 2023-09-09 DIAGNOSIS — E0865 Diabetes mellitus due to underlying condition with hyperglycemia: Secondary | ICD-10-CM | POA: Diagnosis not present

## 2023-09-09 DIAGNOSIS — N179 Acute kidney failure, unspecified: Secondary | ICD-10-CM | POA: Diagnosis not present

## 2023-09-09 DIAGNOSIS — I10 Essential (primary) hypertension: Secondary | ICD-10-CM | POA: Diagnosis not present

## 2023-09-09 DIAGNOSIS — K219 Gastro-esophageal reflux disease without esophagitis: Secondary | ICD-10-CM | POA: Diagnosis not present

## 2023-09-09 DIAGNOSIS — E785 Hyperlipidemia, unspecified: Secondary | ICD-10-CM | POA: Diagnosis not present

## 2023-09-11 ENCOUNTER — Other Ambulatory Visit: Payer: Self-pay

## 2023-09-11 ENCOUNTER — Ambulatory Visit (INDEPENDENT_AMBULATORY_CARE_PROVIDER_SITE_OTHER): Payer: Medicare Other | Admitting: Sports Medicine

## 2023-09-11 ENCOUNTER — Other Ambulatory Visit (INDEPENDENT_AMBULATORY_CARE_PROVIDER_SITE_OTHER): Payer: Medicare Other

## 2023-09-11 ENCOUNTER — Encounter: Payer: Self-pay | Admitting: Sports Medicine

## 2023-09-11 DIAGNOSIS — M25562 Pain in left knee: Secondary | ICD-10-CM | POA: Diagnosis not present

## 2023-09-11 DIAGNOSIS — M25512 Pain in left shoulder: Secondary | ICD-10-CM

## 2023-09-11 DIAGNOSIS — K219 Gastro-esophageal reflux disease without esophagitis: Secondary | ICD-10-CM | POA: Diagnosis not present

## 2023-09-11 DIAGNOSIS — M19012 Primary osteoarthritis, left shoulder: Secondary | ICD-10-CM | POA: Diagnosis not present

## 2023-09-11 DIAGNOSIS — M25462 Effusion, left knee: Secondary | ICD-10-CM | POA: Diagnosis not present

## 2023-09-11 DIAGNOSIS — G8929 Other chronic pain: Secondary | ICD-10-CM

## 2023-09-11 DIAGNOSIS — E0865 Diabetes mellitus due to underlying condition with hyperglycemia: Secondary | ICD-10-CM | POA: Diagnosis not present

## 2023-09-11 DIAGNOSIS — N179 Acute kidney failure, unspecified: Secondary | ICD-10-CM | POA: Diagnosis not present

## 2023-09-11 DIAGNOSIS — I10 Essential (primary) hypertension: Secondary | ICD-10-CM | POA: Diagnosis not present

## 2023-09-11 DIAGNOSIS — E785 Hyperlipidemia, unspecified: Secondary | ICD-10-CM | POA: Diagnosis not present

## 2023-09-11 DIAGNOSIS — G309 Alzheimer's disease, unspecified: Secondary | ICD-10-CM

## 2023-09-11 DIAGNOSIS — F0280 Dementia in other diseases classified elsewhere without behavioral disturbance: Secondary | ICD-10-CM

## 2023-09-11 MED ORDER — METHYLPREDNISOLONE ACETATE 40 MG/ML IJ SUSP
40.0000 mg | INTRAMUSCULAR | Status: AC | PRN
Start: 2023-09-11 — End: 2023-09-11
  Administered 2023-09-11: 40 mg via INTRA_ARTICULAR

## 2023-09-11 MED ORDER — BUPIVACAINE HCL 0.25 % IJ SOLN
2.0000 mL | INTRAMUSCULAR | Status: AC | PRN
Start: 2023-09-11 — End: 2023-09-11
  Administered 2023-09-11: 2 mL via INTRA_ARTICULAR

## 2023-09-11 MED ORDER — LIDOCAINE HCL 1 % IJ SOLN
2.0000 mL | INTRAMUSCULAR | Status: AC | PRN
Start: 2023-09-11 — End: 2023-09-11
  Administered 2023-09-11: 2 mL

## 2023-09-11 NOTE — Progress Notes (Signed)
Elizabeth Schwartz - 77 y.o. female MRN [id number]  Date of birth: [birth year]  Office Visit Note: Visit Date: 09/11/2023 PCP: Storm Frisk, MD Referred by: Storm Frisk, MD  Subjective: Chief Complaint  Patient presents with   Left Knee - Pain   Left Shoulder - Pain   HPI: Elizabeth Schwartz is a pleasant 77 y.o. female who presents today for left knee pain and left shoulder pain.   She has had left knee swelling for a few weeks.  She gets pain within the knee.  Takes Tylenol for any pain.  She also has left shoulder pain, this has been chronic as well.  She is at [facility].  Per chart review, she does have Alzheimer's dementia.  She is managed on Aricept 10 mg and Namenda 5 mg twice daily.  Her dementia is quite advanced, as she had difficulty remembering multiple times a day while she was here.  Independent note reviewed from ED and hospitalist note from 08/09/2023 showed admission for pressing injury for left thigh with diagnosed rhabdomyolysis after a fall and neighbors found her into the ground with a stroke on top of her.  Pertinent ROS were reviewed with the patient and found to be negative unless otherwise specified above in HPI.   Assessment & Plan: Visit Diagnoses:  1. Chronic left shoulder pain   2. Primary osteoarthritis, left shoulder   3. Chronic pain of left knee   4. Effusion, left knee   5. Dementia due to Alzheimer's disease [facility])    Plan: Impression is chronic left shoulder pain likely secondary to advanced glenohumeral joint arthritis.  She also has mild to moderate left knee OA with a small effusion of the knee joint today.  Through shared decision making, we did proceed with ultrasound-guided intra-articular knee injection, patient tolerated well.  Will use ice, Tylenol and topical medication for the next 48 to 72 hours.  Did recommend physical therapy to help with range of motion of the left shoulder as well as lower  extremity strengthening and balance -she may perform this at her rehab facility.  Paul unfortunately has rather advanced Alzheimer's dementia which is being managed by her primary physician, she may continue her Aricept 10 mg and Namenda 5 mg twice daily.  I do think her dementia is limiting her ability to communicate what sort of pain she is having.  In terms of the left shoulder, recommend PT and she may use topical Voltaren gel.  Could always consider glenohumeral joint injection in the future.  I did fill out her paperwork with today's visit today have her return to her facility provider.  Follow-up: Return if symptoms worsen or fail to improve.   Meds & Orders: No orders of the defined types were placed in this encounter.   Orders Placed This Encounter  Procedures   Large Joint Inj   XR Shoulder Left   US Guided Needle Placement - No Linked Charges     Procedures: Large Joint Inj: L knee on 09/11/2023 5:19 PM Indications: pain and joint swelling Details: 22 G 1.5 in needle, ultrasound-guided superolateral approach Medications: 2 mL lidocaine 1 %; 2 mL bupivacaine 0.25 %; 40 mg methylPREDNISolone acetate 40 MG/ML Outcome: tolerated well, no immediate complications  Procedure: Ultrasound-guided Knee Injection, Left After discussion on risk/benefits/indication, informed verbal consent was obtained. A timeout was then performed. The patient was lying in supine on examination table with a small bolster underneath the affected knee for  comfort. The patient's knee was prepped with Betadine and alcohol swabs. Utilizing ultrasound-guidance with the probe in a transverse position, the patient's suprapatellar bursa was identified and the knee joint was subsequently injected intraarticularly with a mixture of 2:2:1 lidocaine:bupivicaine:depomedrol utilizing an in-plane visualization approach. Patient tolerated the procedure well without immediate complications.  Procedure, treatment alternatives,  risks and benefits explained, specific risks discussed. Consent was given by the patient. Immediately prior to procedure a time out was called to verify the correct patient, procedure, equipment, support staff and site/side marked as required. Patient was prepped and draped in the usual sterile fashion.          Clinical History: No specialty comments available.  She reports that she quit smoking about 11 years ago. Her smoking use included cigarettes. She started smoking about 26 years ago. She has quit using smokeless tobacco.  Her smokeless tobacco use included chew.  Recent Labs    11/17/22 1133 07/13/23 1218  HGBA1C 6.2 6.1*    Objective:    Physical Exam  Gen: Well-appearing, in no acute distress; non-toxic CV: Well-perfused. Warm.  Resp: Breathing unlabored on room air; no wheezing. Psych: Fluid speech in conversation; appropriate affect; normal thought process Neuro: Sensation intact throughout. No gross coordination deficits.   Ortho Exam - Left shoulder: There is restriction in both active and passive range of motion with some grating through range of motion.  No specific AC joint or bony TTP.  No redness swelling or effusion.  - Left knee: There is a small to moderate effusion about the knee as well as some swelling of the soft tissue of the knee itself.  The distal quadricep was wrapped with Coband.  No redness or significant warmth about the knee.  Patient is essentially wheelchair-bound, she was unable to walk without full assistance.   Imaging:  XR Shoulder Left 3 views of the left shoulder including Grashey, scapular Y and axial view  were ordered and reviewed by myself.  X-rays show advanced arthritic  change with notable bony sclerosis and joint space narrowing.  There is  callus formation which could be secondary to her glenohumeral joint  arthritis versus prior humeral head fracture, although no evidence of  acute bony fracture.  Humeral head is well  located.    Narrative & Impression  CLINICAL DATA:  Trauma, fall.   EXAM: CT CHEST, ABDOMEN AND PELVIS WITHOUT CONTRAST   TECHNIQUE: Multidetector CT imaging of the chest, abdomen and pelvis was performed following the standard protocol without IV contrast.   RADIATION DOSE REDUCTION: This exam was performed according to the departmental dose-optimization program which includes automated exposure control, adjustment of the mA and/or kV according to patient size and/or use of iterative reconstruction technique.   COMPARISON:  CT angiogram chest 11/17/2011.   FINDINGS: CT CHEST FINDINGS   Cardiovascular: The heart is normal in size. The aorta is normal in size. There is calcified atherosclerotic disease throughout the aorta and coronary arteries. There is a small pericardial effusion.   Mediastinum/Nodes: No enlarged mediastinal, hilar, or axillary lymph nodes. Thyroid gland, trachea, and esophagus demonstrate no significant findings. Small pericardial cyst is unchanged posterior to the left atrium measuring up to 13 mm.   Lungs/Pleura: Mild emphysema is present. There is a small amount of airspace consolidation in the lingula. There are minimal peripheral ground-glass opacities scattered throughout the right lung. There is no pleural effusion or pneumothorax.   Musculoskeletal: No acute fractures are seen.   CT ABDOMEN PELVIS FINDINGS  Hepatobiliary: Gallstones are present. There is no biliary ductal dilatation. No focal liver abnormality.   Pancreas: Unremarkable. No pancreatic ductal dilatation or surrounding inflammatory changes.   Spleen: Normal in size without focal abnormality.   Adrenals/Urinary Tract: Low-density right adrenal nodule is compatible with adenoma measuring 13 mm, unchanged. The left adrenal gland is within normal limits. There is mild nonspecific bilateral perinephric fat stranding. There is no hydronephrosis or urinary tract calculus. The  bladder is within normal limits.   Stomach/Bowel: Stomach is within normal limits. Appendix appears normal. No evidence of bowel wall thickening, distention, or inflammatory changes.   Vascular/Lymphatic: Aortic atherosclerosis. No enlarged abdominal or pelvic lymph nodes.   Reproductive: Status post hysterectomy. No adnexal masses.   Other: No abdominal wall hernia or abnormality. No abdominopelvic ascites.   Musculoskeletal: No acute fractures are seen.   IMPRESSION: 1. No acute posttraumatic sequelae in the chest, abdomen or pelvis. 2. Small amount of airspace consolidation in the lingula worrisome for infection. 3. Minimal peripheral ground-glass opacities in the right lung may be infectious/inflammatory. 4. Small pericardial effusion. 5. Cholelithiasis. 6. Stable right adrenal adenoma.   Aortic Atherosclerosis (ICD10-I70.0).     Electronically Signed   By: Darliss Cheney M.D.   On: 08/09/2023 23:50    Narrative & Impression  CLINICAL DATA:  Unwitnessed fall.   EXAM: PELVIS - 1-2 VIEW   COMPARISON:  None Available.   FINDINGS: There is no evidence of pelvic fracture or diastasis. No pelvic bone lesions are seen.   IMPRESSION: Negative.     Electronically Signed   By: Aram Candela M.D.   On: 08/09/2023 22:18    Narrative & Impression  CLINICAL DATA:  Unwitnessed fall.   EXAM: LEFT FEMUR 2 VIEWS   COMPARISON:  None Available.   FINDINGS: There is no evidence of fracture or other focal bone lesions. Soft tissues are unremarkable.   IMPRESSION: Negative.     Electronically Signed   By: Aram Candela M.D.   On: 08/09/2023 22:18    Past Medical/Family/Surgical/Social History: Medications & Allergies reviewed per EMR, new medications updated. Patient Active Problem List   Diagnosis Date Noted   Rhabdomyolysis 08/10/2023   AKI (acute kidney injury) (HCC) 08/10/2023   Transaminitis 08/10/2023   CAP (community acquired pneumonia)  08/10/2023   Lactic acidosis 08/10/2023   Elevated troponin 08/10/2023   Pressure injury of skin with suspected deep tissue injury 08/10/2023   Porokeratosis 01/02/2023   Dementia due to Alzheimer's disease (HCC) 08/11/2022   Statin myopathy 04/21/2022   Pain due to onychomycosis of toenails of both feet 02/22/2022   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) 11/22/2021   Chronic pain of right hip 09/15/2021   Chronic pain of left knee 09/15/2021   Chronic left shoulder pain 05/24/2021   Mixed hyperlipidemia 03/22/2021   Pincer nail deformity 11/03/2020   Diabetes type 2, controlled (HCC) 06/11/2019   Schizophrenia (HCC) 03/20/2019   Chronic diarrhea 03/20/2019   Hypertension 08/22/2016   Anemia 08/22/2016   Major neurocognitive disorder due to multiple etiologies with behavioral disturbance (HCC) 08/22/2016   Osteoporosis 08/22/2016   Asthma, moderate persistent 09/08/2011   Diverticulosis 09/08/2011   Former smoker 09/08/2011   GERD 06/04/2007   Past Medical History:  Diagnosis Date   Anemia    COPD (chronic obstructive pulmonary disease) (HCC)    Dementia (HCC)    Depression    Diabetes mellitus    GERD (gastroesophageal reflux disease)    Hyperlipidemia  Hypertension    Osteoporosis    Schizophrenia, schizo-affective (HCC)    SOB (shortness of breath) on exertion    Syncope 06/11/2019   Family History  Problem Relation Age of Onset   Breast cancer Neg Hx    Past Surgical History:  Procedure Laterality Date   BREAST BIOPSY Right 2017   benign   LAPAROSCOPY  1975   ABD pain   VAGINAL HYSTERECTOMY  1975   Social History   Occupational History   Not on file  Tobacco Use   Smoking status: Former    Current packs/day: 0.00    Types: Cigarettes    Start date: 02/27/1997    Quit date: 02/28/2012    Years since quitting: 11.5   Smokeless tobacco: Former    Types: Engineer, drilling   Vaping status: Never Used  Substance and Sexual Activity   Alcohol  use: Not Currently    Comment: "socially"   Drug use: No   Sexual activity: Not Currently

## 2023-09-13 ENCOUNTER — Telehealth: Payer: Self-pay

## 2023-09-13 ENCOUNTER — Ambulatory Visit (HOSPITAL_BASED_OUTPATIENT_CLINIC_OR_DEPARTMENT_OTHER): Payer: Medicare Other | Admitting: Physician Assistant

## 2023-09-13 VITALS — BP 111/71 | HR 108

## 2023-09-13 DIAGNOSIS — E0865 Diabetes mellitus due to underlying condition with hyperglycemia: Secondary | ICD-10-CM | POA: Diagnosis not present

## 2023-09-13 DIAGNOSIS — I10 Essential (primary) hypertension: Secondary | ICD-10-CM | POA: Diagnosis not present

## 2023-09-13 DIAGNOSIS — T796XXD Traumatic ischemia of muscle, subsequent encounter: Secondary | ICD-10-CM

## 2023-09-13 DIAGNOSIS — N179 Acute kidney failure, unspecified: Secondary | ICD-10-CM | POA: Diagnosis not present

## 2023-09-13 DIAGNOSIS — K219 Gastro-esophageal reflux disease without esophagitis: Secondary | ICD-10-CM | POA: Diagnosis not present

## 2023-09-13 DIAGNOSIS — E785 Hyperlipidemia, unspecified: Secondary | ICD-10-CM | POA: Diagnosis not present

## 2023-09-13 DIAGNOSIS — Z09 Encounter for follow-up examination after completed treatment for conditions other than malignant neoplasm: Secondary | ICD-10-CM

## 2023-09-13 NOTE — Progress Notes (Signed)
Patient ID: Elizabeth Schwartz, female   DOB: 08-17-1946, 77 y.o.   MRN: [id number]    After hospitalization 9/25-10/02/2023 Brief Summary: Patient is a 77 y.o.  female with history of HTN, DM-2, depression/schizoaffective disorder presented with a fall-neighbors found her pinned to the ground with the stove on top of her.  She was subsequently brought to the ED-she was found to have compressive injury to her left thigh along with AKI and rhabdomyolysis.   Significant events: 9/25>> admit to [facility]   Significant studies: 9/25>> CXR: Small pleural effusion. 9/25>> x-ray left femur: Negative for fracture 9/25>> x-ray pelvis:No fracture. 9/25>> CT head: No acute intracranial process. 9/25>> CT C-spine no acute 9/25>> CT chest/abdomen/pelvis: No acute posttraumatic sequelae. 9/27>> CT L-spine: No fracture 9/27 >>CT T-spine: No fracture 9/27>> MRI lumbar/thoracic spine: No acute abnormality.   Significant microbiology data: 9/25>> COVID/influenza/RSV PCR: Negative 9/25>> culture: No growth   Procedures: None   Consults: Orthopedics Neurology   Brief Hospital Course: Trauma from stove falling on her left thigh Left thigh swelling secondary to compression injury Ortho following-no signs of compartment  syndrome. Gradual improvement in the swelling of the left thigh with past several days.   Bilateral lower extremity weakness (left> right) Difficult exam due to cognitive issues/pain-suspect this is more of an issue with cognitive issues/rhabdomyolysis/compressive neuropathy rather than anything significant pathology.  Slowly improving-mom movement in her left lower extremity for the past several days-now of somewhat more easily moving it side-to-side-just about able to somewhat bend the knee.  She was not able to do any of this on initial presentation. Extensive neuroimaging was negative-neurology followed-no further recommendations.  Continue to work with PT/OT.   Acute urinary  retention Noted on 9/29-subsequently started on Foley/Flomax Foley removed 10/3-voiding spontaneously.   AKI Resolved Likely due to rhabdo   Rhabdomyolysis Secondary to compression from stove-on left thigh CK trend continues to slowly improve   Transaminitis Secondary to rhabdo Downtrending Trend LFTs   Elevated troponins Secondary to rhabdo Echo stable per cardiology   PNA Afebrile Completed empiric treatment with Rocephin/Zithromax.   Currently asymptomatic.   HLD Holding Zetia/fenofibrate until rhabdomyolysis improves   DM-2 (A1c 6.1 on 8/29) CBG stable SSI   Dementia Mood disorder ?  Cognitive dysfunction at baseline Continue clozapine/Aricept/Namenda  Some mild delirium occasionally-maintain delirium precautions. PT/OT/TOC eval-SNF plan.    BMI: Estimated body mass index is 28.03 kg/m as calculated from the following:   Height as of this encounter: 5\' 5"  (1.651 m).   Weight as of this encounter: 76.4 kg.    BMI: Estimated body mass index is 28.03 kg/m as calculated from the following:   Height as of this encounter: 5\' 5"  (1.651 m).   Weight as of this encounter: 76.4 kg.      Discharge Diagnoses:  Principal Problem:   Rhabdomyolysis Active Problems:   AKI (acute kidney injury) (HCC)   Pressure injury of skin with suspected deep tissue injury   Transaminitis   CAP (community acquired pneumonia)   Lactic acidosis   Elevated troponin   Major neurocognitive disorder due to multiple etiologies with behavioral disturbance (HCC)   Diabetes type 2, controlled (HCC)   Dementia due to Alzheimer's disease (HCC)

## 2023-09-13 NOTE — Telephone Encounter (Signed)
Patient arrived for an appointment today with paperwork from [facility]..  She was not sure why she was here.   I spoke to [place] and then Letta Moynahan with [facility] who both confirmed that the patient is still a resident there  Shanda Bumps said that the appointment was made because they thought the patient was seeing a pulmonologist here. I explained to her that this is a primary care office. Shanda Bumps said that the facility provider would be the patient's PCP.  She said that their transportation department took the patient to her appointment here and will bring her back.  I told her that I would call the driver, Asher Muir, and let her know that the patient is ready to return to the facility.    Shanda Bumps then said that their SW is working with the patient's niece on a discharge plan.  I told her that the niece has not been involved up until this point, so they should clarify what assistance the niece will provide if the patient is to leave the facility.  The patient is not safe to live alone.  Shanda Bumps said she understood and would speak with the SW.

## 2023-09-15 DIAGNOSIS — R1310 Dysphagia, unspecified: Secondary | ICD-10-CM | POA: Diagnosis not present

## 2023-09-15 DIAGNOSIS — M6282 Rhabdomyolysis: Secondary | ICD-10-CM | POA: Diagnosis not present

## 2023-09-15 DIAGNOSIS — E785 Hyperlipidemia, unspecified: Secondary | ICD-10-CM | POA: Diagnosis not present

## 2023-09-15 DIAGNOSIS — M6259 Muscle wasting and atrophy, not elsewhere classified, multiple sites: Secondary | ICD-10-CM | POA: Diagnosis not present

## 2023-09-15 DIAGNOSIS — N179 Acute kidney failure, unspecified: Secondary | ICD-10-CM | POA: Diagnosis not present

## 2023-09-15 DIAGNOSIS — E0865 Diabetes mellitus due to underlying condition with hyperglycemia: Secondary | ICD-10-CM | POA: Diagnosis not present

## 2023-09-15 DIAGNOSIS — K219 Gastro-esophageal reflux disease without esophagitis: Secondary | ICD-10-CM | POA: Diagnosis not present

## 2023-09-15 DIAGNOSIS — M25562 Pain in left knee: Secondary | ICD-10-CM | POA: Diagnosis not present

## 2023-09-15 DIAGNOSIS — M6281 Muscle weakness (generalized): Secondary | ICD-10-CM | POA: Diagnosis not present

## 2023-09-15 DIAGNOSIS — I10 Essential (primary) hypertension: Secondary | ICD-10-CM | POA: Diagnosis not present

## 2023-09-16 DIAGNOSIS — K219 Gastro-esophageal reflux disease without esophagitis: Secondary | ICD-10-CM | POA: Diagnosis not present

## 2023-09-16 DIAGNOSIS — E0865 Diabetes mellitus due to underlying condition with hyperglycemia: Secondary | ICD-10-CM | POA: Diagnosis not present

## 2023-09-16 DIAGNOSIS — E785 Hyperlipidemia, unspecified: Secondary | ICD-10-CM | POA: Diagnosis not present

## 2023-09-16 DIAGNOSIS — N179 Acute kidney failure, unspecified: Secondary | ICD-10-CM | POA: Diagnosis not present

## 2023-09-16 DIAGNOSIS — I10 Essential (primary) hypertension: Secondary | ICD-10-CM | POA: Diagnosis not present

## 2023-09-17 ENCOUNTER — Inpatient Hospital Stay (HOSPITAL_COMMUNITY)
Admission: EM | Admit: 2023-09-17 | Discharge: 2023-11-01 | DRG: 622 | Disposition: A | Discharge status: Skilled Nursing Facility | Payer: Medicare Other | Attending: Family Medicine | Admitting: Family Medicine

## 2023-09-17 ENCOUNTER — Emergency Department (HOSPITAL_COMMUNITY): Payer: Medicare Other

## 2023-09-17 ENCOUNTER — Encounter (HOSPITAL_COMMUNITY): Payer: Self-pay | Admitting: Emergency Medicine

## 2023-09-17 ENCOUNTER — Other Ambulatory Visit: Payer: Self-pay

## 2023-09-17 DIAGNOSIS — F02B18 Dementia in other diseases classified elsewhere, moderate, with other behavioral disturbance: Secondary | ICD-10-CM | POA: Diagnosis present

## 2023-09-17 DIAGNOSIS — L97529 Non-pressure chronic ulcer of other part of left foot with unspecified severity: Secondary | ICD-10-CM | POA: Diagnosis not present

## 2023-09-17 DIAGNOSIS — E1165 Type 2 diabetes mellitus with hyperglycemia: Secondary | ICD-10-CM | POA: Diagnosis present

## 2023-09-17 DIAGNOSIS — S81802A Unspecified open wound, left lower leg, initial encounter: Secondary | ICD-10-CM | POA: Diagnosis present

## 2023-09-17 DIAGNOSIS — M6281 Muscle weakness (generalized): Secondary | ICD-10-CM | POA: Diagnosis not present

## 2023-09-17 DIAGNOSIS — Z66 Do not resuscitate: Secondary | ICD-10-CM | POA: Diagnosis not present

## 2023-09-17 DIAGNOSIS — D638 Anemia in other chronic diseases classified elsewhere: Secondary | ICD-10-CM | POA: Diagnosis not present

## 2023-09-17 DIAGNOSIS — I129 Hypertensive chronic kidney disease with stage 1 through stage 4 chronic kidney disease, or unspecified chronic kidney disease: Secondary | ICD-10-CM | POA: Diagnosis not present

## 2023-09-17 DIAGNOSIS — M6080 Other myositis, unspecified site: Secondary | ICD-10-CM | POA: Diagnosis not present

## 2023-09-17 DIAGNOSIS — I10 Essential (primary) hypertension: Secondary | ICD-10-CM | POA: Diagnosis present

## 2023-09-17 DIAGNOSIS — Z7984 Long term (current) use of oral hypoglycemic drugs: Secondary | ICD-10-CM

## 2023-09-17 DIAGNOSIS — M7989 Other specified soft tissue disorders: Secondary | ICD-10-CM | POA: Insufficient documentation

## 2023-09-17 DIAGNOSIS — M25562 Pain in left knee: Secondary | ICD-10-CM | POA: Diagnosis not present

## 2023-09-17 DIAGNOSIS — Z1152 Encounter for screening for COVID-19: Secondary | ICD-10-CM

## 2023-09-17 DIAGNOSIS — Z87891 Personal history of nicotine dependence: Secondary | ICD-10-CM

## 2023-09-17 DIAGNOSIS — M25462 Effusion, left knee: Secondary | ICD-10-CM | POA: Diagnosis not present

## 2023-09-17 DIAGNOSIS — I96 Gangrene, not elsewhere classified: Secondary | ICD-10-CM | POA: Diagnosis not present

## 2023-09-17 DIAGNOSIS — Z743 Need for continuous supervision: Secondary | ICD-10-CM | POA: Diagnosis not present

## 2023-09-17 DIAGNOSIS — L89224 Pressure ulcer of left hip, stage 4: Secondary | ICD-10-CM | POA: Diagnosis present

## 2023-09-17 DIAGNOSIS — M25512 Pain in left shoulder: Secondary | ICD-10-CM | POA: Diagnosis not present

## 2023-09-17 DIAGNOSIS — M2012 Hallux valgus (acquired), left foot: Secondary | ICD-10-CM | POA: Diagnosis not present

## 2023-09-17 DIAGNOSIS — Z556 Problems related to health literacy: Secondary | ICD-10-CM | POA: Diagnosis not present

## 2023-09-17 DIAGNOSIS — Z751 Person awaiting admission to adequate facility elsewhere: Secondary | ICD-10-CM

## 2023-09-17 DIAGNOSIS — S81802D Unspecified open wound, left lower leg, subsequent encounter: Secondary | ICD-10-CM | POA: Diagnosis not present

## 2023-09-17 DIAGNOSIS — D649 Anemia, unspecified: Secondary | ICD-10-CM | POA: Diagnosis not present

## 2023-09-17 DIAGNOSIS — F0390 Unspecified dementia without behavioral disturbance: Secondary | ICD-10-CM | POA: Diagnosis not present

## 2023-09-17 DIAGNOSIS — Z7951 Long term (current) use of inhaled steroids: Secondary | ICD-10-CM

## 2023-09-17 DIAGNOSIS — Z515 Encounter for palliative care: Secondary | ICD-10-CM | POA: Diagnosis not present

## 2023-09-17 DIAGNOSIS — Z794 Long term (current) use of insulin: Secondary | ICD-10-CM

## 2023-09-17 DIAGNOSIS — Z79899 Other long term (current) drug therapy: Secondary | ICD-10-CM

## 2023-09-17 DIAGNOSIS — M60852 Other myositis, left thigh: Secondary | ICD-10-CM | POA: Diagnosis present

## 2023-09-17 DIAGNOSIS — T7601XA Adult neglect or abandonment, suspected, initial encounter: Secondary | ICD-10-CM | POA: Diagnosis present

## 2023-09-17 DIAGNOSIS — L89226 Pressure-induced deep tissue damage of left hip: Secondary | ICD-10-CM | POA: Insufficient documentation

## 2023-09-17 DIAGNOSIS — M86152 Other acute osteomyelitis, left femur: Secondary | ICD-10-CM

## 2023-09-17 DIAGNOSIS — R000 Tachycardia, unspecified: Secondary | ICD-10-CM | POA: Diagnosis not present

## 2023-09-17 DIAGNOSIS — N189 Chronic kidney disease, unspecified: Secondary | ICD-10-CM | POA: Diagnosis not present

## 2023-09-17 DIAGNOSIS — E119 Type 2 diabetes mellitus without complications: Secondary | ICD-10-CM

## 2023-09-17 DIAGNOSIS — S71102A Unspecified open wound, left thigh, initial encounter: Secondary | ICD-10-CM | POA: Diagnosis not present

## 2023-09-17 DIAGNOSIS — B965 Pseudomonas (aeruginosa) (mallei) (pseudomallei) as the cause of diseases classified elsewhere: Secondary | ICD-10-CM | POA: Diagnosis present

## 2023-09-17 DIAGNOSIS — N179 Acute kidney failure, unspecified: Secondary | ICD-10-CM | POA: Diagnosis not present

## 2023-09-17 DIAGNOSIS — Z7983 Long term (current) use of bisphosphonates: Secondary | ICD-10-CM

## 2023-09-17 DIAGNOSIS — E11621 Type 2 diabetes mellitus with foot ulcer: Secondary | ICD-10-CM | POA: Diagnosis present

## 2023-09-17 DIAGNOSIS — F02A0 Dementia in other diseases classified elsewhere, mild, without behavioral disturbance, psychotic disturbance, mood disturbance, and anxiety: Secondary | ICD-10-CM | POA: Diagnosis not present

## 2023-09-17 DIAGNOSIS — G309 Alzheimer's disease, unspecified: Secondary | ICD-10-CM | POA: Diagnosis not present

## 2023-09-17 DIAGNOSIS — E876 Hypokalemia: Secondary | ICD-10-CM | POA: Diagnosis not present

## 2023-09-17 DIAGNOSIS — J449 Chronic obstructive pulmonary disease, unspecified: Secondary | ICD-10-CM | POA: Diagnosis present

## 2023-09-17 DIAGNOSIS — I951 Orthostatic hypotension: Secondary | ICD-10-CM

## 2023-09-17 DIAGNOSIS — Y92009 Unspecified place in unspecified non-institutional (private) residence as the place of occurrence of the external cause: Secondary | ICD-10-CM | POA: Diagnosis not present

## 2023-09-17 DIAGNOSIS — R531 Weakness: Secondary | ICD-10-CM | POA: Diagnosis not present

## 2023-09-17 DIAGNOSIS — Z90710 Acquired absence of both cervix and uterus: Secondary | ICD-10-CM

## 2023-09-17 DIAGNOSIS — E1122 Type 2 diabetes mellitus with diabetic chronic kidney disease: Secondary | ICD-10-CM | POA: Diagnosis not present

## 2023-09-17 DIAGNOSIS — M21372 Foot drop, left foot: Secondary | ICD-10-CM | POA: Diagnosis not present

## 2023-09-17 DIAGNOSIS — D509 Iron deficiency anemia, unspecified: Secondary | ICD-10-CM | POA: Diagnosis not present

## 2023-09-17 DIAGNOSIS — Z9181 History of falling: Secondary | ICD-10-CM | POA: Diagnosis not present

## 2023-09-17 DIAGNOSIS — T402X5A Adverse effect of other opioids, initial encounter: Secondary | ICD-10-CM | POA: Diagnosis not present

## 2023-09-17 DIAGNOSIS — E1169 Type 2 diabetes mellitus with other specified complication: Secondary | ICD-10-CM | POA: Diagnosis not present

## 2023-09-17 DIAGNOSIS — M869 Osteomyelitis, unspecified: Secondary | ICD-10-CM | POA: Diagnosis present

## 2023-09-17 DIAGNOSIS — S7712XD Crushing injury of left thigh, subsequent encounter: Secondary | ICD-10-CM

## 2023-09-17 DIAGNOSIS — D500 Iron deficiency anemia secondary to blood loss (chronic): Secondary | ICD-10-CM | POA: Diagnosis not present

## 2023-09-17 DIAGNOSIS — S7712XA Crushing injury of left thigh, initial encounter: Secondary | ICD-10-CM | POA: Diagnosis not present

## 2023-09-17 DIAGNOSIS — M25572 Pain in left ankle and joints of left foot: Secondary | ICD-10-CM | POA: Diagnosis present

## 2023-09-17 DIAGNOSIS — F209 Schizophrenia, unspecified: Secondary | ICD-10-CM | POA: Diagnosis present

## 2023-09-17 DIAGNOSIS — K219 Gastro-esophageal reflux disease without esophagitis: Secondary | ICD-10-CM | POA: Diagnosis not present

## 2023-09-17 DIAGNOSIS — M6282 Rhabdomyolysis: Secondary | ICD-10-CM | POA: Diagnosis not present

## 2023-09-17 DIAGNOSIS — L89894 Pressure ulcer of other site, stage 4: Secondary | ICD-10-CM | POA: Diagnosis present

## 2023-09-17 DIAGNOSIS — M79652 Pain in left thigh: Secondary | ICD-10-CM | POA: Diagnosis present

## 2023-09-17 DIAGNOSIS — E785 Hyperlipidemia, unspecified: Secondary | ICD-10-CM | POA: Diagnosis not present

## 2023-09-17 DIAGNOSIS — M79672 Pain in left foot: Secondary | ICD-10-CM | POA: Diagnosis not present

## 2023-09-17 DIAGNOSIS — Z7189 Other specified counseling: Secondary | ICD-10-CM

## 2023-09-17 DIAGNOSIS — M609 Myositis, unspecified: Secondary | ICD-10-CM | POA: Diagnosis not present

## 2023-09-17 DIAGNOSIS — R6889 Other general symptoms and signs: Secondary | ICD-10-CM | POA: Diagnosis not present

## 2023-09-17 DIAGNOSIS — M868X5 Other osteomyelitis, thigh: Secondary | ICD-10-CM | POA: Diagnosis not present

## 2023-09-17 DIAGNOSIS — M60052 Infective myositis, left thigh: Secondary | ICD-10-CM | POA: Diagnosis not present

## 2023-09-17 DIAGNOSIS — Z043 Encounter for examination and observation following other accident: Secondary | ICD-10-CM | POA: Diagnosis not present

## 2023-09-17 DIAGNOSIS — S71102D Unspecified open wound, left thigh, subsequent encounter: Secondary | ICD-10-CM | POA: Diagnosis not present

## 2023-09-17 DIAGNOSIS — M810 Age-related osteoporosis without current pathological fracture: Secondary | ICD-10-CM | POA: Diagnosis present

## 2023-09-17 LAB — CBC WITH DIFFERENTIAL/PLATELET
Abs Immature Granulocytes: 0.11 10*3/uL — ABNORMAL HIGH (ref 0.00–0.07)
Basophils Absolute: 0 10*3/uL (ref 0.0–0.1)
Basophils Relative: 0 %
Eosinophils Absolute: 0.1 10*3/uL (ref 0.0–0.5)
Eosinophils Relative: 1 %
HCT: 28 % — ABNORMAL LOW (ref 36.0–46.0)
Hemoglobin: 8.4 g/dL — ABNORMAL LOW (ref 12.0–15.0)
Immature Granulocytes: 1 %
Lymphocytes Relative: 11 %
Lymphs Abs: 1.1 10*3/uL (ref 0.7–4.0)
MCH: 26.8 pg (ref 26.0–34.0)
MCHC: 30 g/dL (ref 30.0–36.0)
MCV: 89.5 fL (ref 80.0–100.0)
Monocytes Absolute: 0.7 10*3/uL (ref 0.1–1.0)
Monocytes Relative: 7 %
Neutro Abs: 7.9 10*3/uL — ABNORMAL HIGH (ref 1.7–7.7)
Neutrophils Relative %: 80 %
Platelets: 517 10*3/uL — ABNORMAL HIGH (ref 150–400)
RBC: 3.13 MIL/uL — ABNORMAL LOW (ref 3.87–5.11)
RDW: 15.4 % (ref 11.5–15.5)
WBC: 10 10*3/uL (ref 4.0–10.5)
nRBC: 0 % (ref 0.0–0.2)

## 2023-09-17 LAB — COMPREHENSIVE METABOLIC PANEL
ALT: 18 U/L (ref 0–44)
AST: 24 U/L (ref 15–41)
Albumin: 2.8 g/dL — ABNORMAL LOW (ref 3.5–5.0)
Alkaline Phosphatase: 49 U/L (ref 38–126)
Anion gap: 12 (ref 5–15)
BUN: 19 mg/dL (ref 8–23)
CO2: 21 mmol/L — ABNORMAL LOW (ref 22–32)
Calcium: 8.5 mg/dL — ABNORMAL LOW (ref 8.9–10.3)
Chloride: 104 mmol/L (ref 98–111)
Creatinine, Ser: 0.96 mg/dL (ref 0.44–1.00)
GFR, Estimated: >60 mL/min (ref >60)
Glucose, Bld: 117 mg/dL — ABNORMAL HIGH (ref 70–99)
Potassium: 4 mmol/L (ref 3.5–5.1)
Sodium: 137 mmol/L (ref 135–145)
Total Bilirubin: 0.4 mg/dL (ref 0.3–1.2)
Total Protein: 6 g/dL — ABNORMAL LOW (ref 6.5–8.1)

## 2023-09-17 LAB — I-STAT CG4 LACTIC ACID, ED
Lactic Acid, Venous: 2 mmol/L (ref 0.5–1.9)
Lactic Acid, Venous: 1.8 mmol/L (ref 0.5–1.9)

## 2023-09-17 LAB — RESP PANEL BY RT-PCR (RSV, FLU A&B, COVID)  RVPGX2
Influenza A by PCR: NEGATIVE
Influenza B by PCR: NEGATIVE
Resp Syncytial Virus by PCR: NEGATIVE
SARS Coronavirus 2 by RT PCR: NEGATIVE

## 2023-09-17 LAB — GLUCOSE, CAPILLARY: Glucose-Capillary: 115 mg/dL — ABNORMAL HIGH (ref 70–99)

## 2023-09-17 LAB — APTT: aPTT: 28 s (ref 24–36)

## 2023-09-17 MED ORDER — ALBUTEROL SULFATE (2.5 MG/3ML) 0.083% IN NEBU: 2.5000 mg | INHALATION_SOLUTION | Freq: Four times a day (QID) | RESPIRATORY_TRACT | Status: DC | PRN

## 2023-09-17 MED ORDER — CLOZAPINE 100 MG PO TABS
200.0000 mg | ORAL_TABLET | Freq: Every day | ORAL | Status: DC
  Administered 2023-09-17 – 2023-10-16 (×30): 200 mg via ORAL
  Filled 2023-09-17 (×31): qty 2

## 2023-09-17 MED ORDER — MOMETASONE FURO-FORMOTEROL FUM 200-5 MCG/ACT IN AERO
2.0000 | INHALATION_SPRAY | Freq: Two times a day (BID) | RESPIRATORY_TRACT | Status: DC
  Administered 2023-09-17 – 2023-11-01 (×87): 2 via RESPIRATORY_TRACT
  Filled 2023-09-17 (×6): qty 8.8

## 2023-09-17 MED ORDER — ACETAMINOPHEN 650 MG RE SUPP
650.0000 mg | Freq: Four times a day (QID) | RECTAL | Status: DC
  Administered 2023-09-22: 650 mg via RECTAL
  Filled 2023-09-17 (×4): qty 1

## 2023-09-17 MED ORDER — IOHEXOL 350 MG/ML SOLN
75.0000 mL | Freq: Once | INTRAVENOUS | Status: AC | PRN
  Administered 2023-09-17: 75 mL via INTRAVENOUS

## 2023-09-17 MED ORDER — VANCOMYCIN HCL IN DEXTROSE 1-5 GM/200ML-% IV SOLN: 1000.0000 mg | INTRAVENOUS | Status: DC

## 2023-09-17 MED ORDER — OXYCODONE HCL 5 MG PO TABS: 5.0000 mg | ORAL_TABLET | Freq: Once | ORAL | Status: DC | PRN

## 2023-09-17 MED ORDER — ACETAMINOPHEN 325 MG PO TABS
650.0000 mg | ORAL_TABLET | Freq: Four times a day (QID) | ORAL | Status: DC
  Administered 2023-09-17 – 2023-11-01 (×160): 650 mg via ORAL
  Filled 2023-09-17 (×162): qty 2

## 2023-09-17 MED ORDER — INSULIN ASPART 100 UNIT/ML IJ SOLN
0.0000 [IU] | Freq: Three times a day (TID) | INTRAMUSCULAR | Status: DC
Start: 2023-09-18 — End: 2023-09-19
  Administered 2023-09-18: 1 [IU] via SUBCUTANEOUS

## 2023-09-17 MED ORDER — PIPERACILLIN-TAZOBACTAM 3.375 G IVPB 30 MIN
3.3750 g | Freq: Once | INTRAVENOUS | Status: AC
  Administered 2023-09-17: 3.375 g via INTRAVENOUS
  Filled 2023-09-17: qty 50

## 2023-09-17 MED ORDER — PIPERACILLIN-TAZOBACTAM 3.375 G IVPB
3.3750 g | Freq: Three times a day (TID) | INTRAVENOUS | Status: DC
  Administered 2023-09-17 – 2023-09-28 (×32): 3.375 g via INTRAVENOUS
  Filled 2023-09-17 (×32): qty 50

## 2023-09-17 MED ORDER — DONEPEZIL HCL 10 MG PO TABS
10.0000 mg | ORAL_TABLET | Freq: Every day | ORAL | Status: DC
  Administered 2023-09-18 – 2023-10-31 (×44): 10 mg via ORAL
  Filled 2023-09-17 (×45): qty 1

## 2023-09-17 MED ORDER — SODIUM CHLORIDE 0.9% FLUSH
10.0000 mL | Freq: Two times a day (BID) | INTRAVENOUS | Status: DC
  Administered 2023-09-17 – 2023-11-01 (×74): 10 mL via INTRAVENOUS

## 2023-09-17 MED ORDER — VANCOMYCIN HCL 1500 MG/300ML IV SOLN
1500.0000 mg | Freq: Once | INTRAVENOUS | Status: AC
  Administered 2023-09-17: 1500 mg via INTRAVENOUS
  Filled 2023-09-17: qty 300

## 2023-09-17 MED ORDER — ENOXAPARIN SODIUM 40 MG/0.4ML IJ SOSY
40.0000 mg | PREFILLED_SYRINGE | INTRAMUSCULAR | Status: DC
  Administered 2023-09-17: 40 mg via SUBCUTANEOUS
  Filled 2023-09-17: qty 0.4

## 2023-09-17 MED ORDER — OXYCODONE HCL 5 MG PO TABS
2.5000 mg | ORAL_TABLET | Freq: Once | ORAL | Status: AC | PRN
  Administered 2023-09-17: 2.5 mg via ORAL
  Filled 2023-09-17: qty 1

## 2023-09-17 MED ORDER — MEMANTINE HCL 10 MG PO TABS
5.0000 mg | ORAL_TABLET | Freq: Two times a day (BID) | ORAL | Status: DC
Start: 2023-09-17 — End: 2023-11-01
  Administered 2023-09-18 – 2023-11-01 (×85): 5 mg via ORAL
  Filled 2023-09-17 (×85): qty 1

## 2023-09-17 NOTE — Progress Notes (Signed)
PHARMACIST - PHYSICIAN ORDER COMMUNICATION  [name] is a 77 y.o. year old female with a history of Schizophrenia on Clozapine PTA. Continuing this medication order as an inpatient requires that monitoring parameters per REMS requirements must be met.   Clozapine REMS Dispense Authorization was obtained, and will dispense inpatient.  RDA code [id number].  Verified Clozapine dose: 200mg   Last ANC value and date reported on the Clozapine REMS website: 7900 09/17/23 ANC monitoring frequency: Monthly Next ANC reporting is due on (date) 12/3.  [name] 09/17/2023, 7:30 PM

## 2023-09-17 NOTE — Progress Notes (Signed)
[facility] following for sepsis protocol. 

## 2023-09-17 NOTE — Consult Note (Signed)
Elizabeth Schwartz is an 77 y.o. female.    Chief Complaint: left thigh open wound  HPI: 77 y/o female with a 2 month history of left thigh pain and wound to medial left thigh. Previous admission for possible rhabdomyolysis and patient sent to SNF. Wound care nurse advised return to the hospital today. She denies any recent falls or injuries since last admission. C/o mild pain to medial thigh. Denies any fevers chills or subjective feelings of illness. She state she feels everything around the wound and distal leg. She has noticed an odor coming from the wound. Currently on zosyn IV in the emergency room.   PCP:  Storm Frisk, MD  PMH: Past Medical History:  Diagnosis Date   Anemia    COPD (chronic obstructive pulmonary disease) (HCC)    Dementia (HCC)    Depression    Diabetes mellitus    GERD (gastroesophageal reflux disease)    Hyperlipidemia    Hypertension    Osteoporosis    Schizophrenia, schizo-affective (HCC)    SOB (shortness of breath) on exertion    Syncope 06/11/2019    PSes:  Allergies  Allergen Reactions   Crestor [Rosuvastatin] Other (See Comments)    Muscle aches.     Medications: Current Facility-Administered Medications  Medication Dose Route Frequency Provider Last Rate Last Admin   sodium chloride flush (NS) 0.9 % injection 10 mL  10 mL Intravenous Q12H Arletha Pili, DO       Current Outpatient Medications  Medication Sig Dispense Refill   acetaminophen (TYLENOL) 325 MG tablet Take 2 tablets (650 mg total) by mouth every 6 (six) hours as needed for mild pain (or Fever >/= 101).     albuterol (VENTOLIN HFA) 108 (90 Base) MCG/ACT inhaler INHALE TWO PUFFS BY MOUTH EVERY 6 HOURS AS NEEDED FOR SHORTNESS OF BREATH 18 g 3   alendronate (FOSAMAX) 70 MG tablet Take 1 tablet (70 mg total) by mouth once a week. 12 tablet 3   Blood Glucose Monitoring Suppl (ONETOUCH VERIO) w/Device KIT Use to check blood sugar 1 kit 0   calcium carbonate (OS-CAL) 600  MG TABS Take 600 mg by mouth 2 (two) times daily with a meal.     clozapine (CLOZARIL) 200 MG tablet Take 200 mg by mouth at bedtime.     donepezil (ARICEPT) 10 MG tablet Take 1 tablet (10 mg total) by mouth at bedtime. 90 tablet 3   ezetimibe (ZETIA) 10 MG tablet Take 1 tablet (10 mg total) by mouth daily. 90 tablet 3   famotidine (PEPCID) 20 MG tablet Take 1 tablet (20 mg total) by mouth 2 (two) times daily. 180 tablet 0   fenofibrate 160 MG tablet Take 1 tablet (160 mg total) by mouth daily. 60 tablet 6   fish oil-omega-3 fatty acids 1000 MG capsule Take 1 g by mouth every morning.     insulin aspart (NOVOLOG FLEXPEN) 100 UNIT/ML FlexPen 0-9 Units, Subcutaneous, 3 times daily with meals CBG < 70: Implement Hypoglycemia measures CBG 70 - 120: 0 units CBG 121 - 150: 1 unit CBG 151 - 200: 2 units CBG 201 - 250: 3 units CBG 251 - 300: 5 units CBG 301 - 350: 7 units CBG 351 - 400: 9 units CBG > 400: call MD     Iron, Ferrous Sulfate, 325 (65 Fe) MG TABS Take 325 mg by mouth daily with breakfast. 60 tablet 4   memantine (NAMENDA) 5 MG tablet Take 5 mg by  mouth 2 (two) times daily.     metFORMIN (GLUCOPHAGE) 500 MG tablet Take 1 tablet (500 mg total) by mouth 2 (two) times daily with a meal. 180 tablet 2   Multiple Vitamins-Minerals (CENTRUM SILVER ULTRA WOMENS PO) Take 1 tablet by mouth daily.     OneTouch Delica Lancets 33G MISC Use to check blood sugar daily 100 each 1   ONETOUCH VERIO test strip Use as instructed 100 each 2   SYMBICORT 160-4.5 MCG/ACT inhaler INHALE TWO PUFFS INTO THE LUNGS IN THE MORNING AND AT BEDTIME 10.2 g 2   vitamin C (ASCORBIC ACID) 500 MG tablet Take 500 mg by mouth every morning.     VITAMIN D, CHOLECALCIFEROL, PO Take 600 Units by mouth daily.      Results for orders placed or performed during the hospital encounter of 09/17/23 (from the past 48 hour(s))  CBC with Differential     Status: Abnormal   Collection Time: 09/17/23 12:27 PM  Result Value Ref Range   WBC  10.0 4.0 - 10.5 K/uL   RBC 3.13 (L) 3.87 - 5.11 MIL/uL   Hemoglobin 8.4 (L) 12.0 - 15.0 g/dL   HCT 57.8 (L) 46.9 - 62.9 %   MCV 89.5 80.0 - 100.0 fL   MCH 26.8 26.0 - 34.0 pg   MCHC 30.0 30.0 - 36.0 g/dL   RDW 52.8 41.3 - 24.4 %   Platelets 517 (H) 150 - 400 K/uL   nRBC 0.0 0.0 - 0.2 %   Neutrophils Relative % 80 %   Neutro Abs 7.9 (H) 1.7 - 7.7 K/uL   Lymphocytes Relative 11 %   Lymphs Abs 1.1 0.7 - 4.0 K/uL   Monocytes Relative 7 %   Monocytes Absolute 0.7 0.1 - 1.0 K/uL   Eosinophils Relative 1 %   Eosinophils Absolute 0.1 0.0 - 0.5 K/uL   Basophils Relative 0 %   Basophils Absolute 0.0 0.0 - 0.1 K/uL   Immature Granulocytes 1 %   Abs Immature Granulocytes 0.11 (H) 0.00 - 0.07 K/uL    Comment: Performed at [facility], [address] [place], [place] [address]  Comprehensive metabolic panel     Status: Abnormal   Collection Time: 09/17/23 12:27 PM  Result Value Ref Range   Sodium 137 135 - 145 mmol/L   Potassium 4.0 3.5 - 5.1 mmol/L   Chloride 104 98 - 111 mmol/L   CO2 21 (L) 22 - 32 mmol/L   Glucose, Bld 117 (H) 70 - 99 mg/dL    Comment: Glucose reference range applies only to samples taken after fasting for at least 8 hours.   BUN 19 8 - 23 mg/dL   Creatinine, Ser 2.53 0.44 - 1.00 mg/dL   Calcium 8.5 (L) 8.9 - 10.3 mg/dL   Total Protein 6.0 (L) 6.5 - 8.1 g/dL   Albumin 2.8 (L) 3.5 - 5.0 g/dL   AST 24 15 - 41 U/L   ALT 18 0 - 44 U/L   Alkaline Phosphatase 49 38 - 126 U/L   Total Bilirubin 0.4 0.3 - 1.2 mg/dL   GFR, Estimated >66 >44 mL/min    Comment: (NOTE) Calculated using the CKD-EPI Creatinine Equation (2021)    Anion gap 12 5 - 15    Comment: Performed at [facility], [address], [place], [place] [address]  Resp panel by RT-PCR (RSV, Flu A&B, Covid) Anterior Nasal Swab     Status: None   Collection Time: 09/17/23 12:51  PM   Specimen: Anterior Nasal Swab  Result Value Ref Range   SARS Coronavirus 2 by RT PCR NEGATIVE NEGATIVE   Influenza  A by PCR NEGATIVE NEGATIVE   Influenza B by PCR NEGATIVE NEGATIVE    Comment: (NOTE) The Xpert Xpress SARS-CoV-2/FLU/RSV plus assay is intended as an aid in the diagnosis of influenza from Nasopharyngeal swab specimens and should not be used as a sole basis for treatment. Nasal washings and aspirates are unacceptable for Xpert Xpress SARS-CoV-2/FLU/RSV testing.  Fact Sheet for Patients: BloggerCourse.com  Fact Sheet for Healthcare Providers: SeriousBroker.it  This test is not yet approved or cleared by the [place] FDA and has been authorized for detection and/or diagnosis of SARS-CoV-2 by FDA under an Emergency Use Authorization (EUA). This EUA will remain in effect (meaning this test can be used) for the duration of the COVID-19 declaration under Section 564(b)(1) of the Act, 21 U.S.C. section 360bbb-3(b)(1), unless the authorization is terminated or revoked.     Resp Syncytial Virus by PCR NEGATIVE NEGATIVE    Comment: (NOTE) Fact Sheet for Patients: BloggerCourse.com  Fact Sheet for Healthcare Providers: SeriousBroker.it  This test is not yet approved or cleared by the [place] FDA and has been authorized for detection and/or diagnosis of SARS-CoV-2 by FDA under an Emergency Use Authorization (EUA). This EUA will remain in effect (meaning this test can be used) for the duration of the COVID-19 declaration under Section 564(b)(1) of the Act, 21 U.S.C. section 360bbb-3(b)(1), unless the authorization is terminated or revoked.  Performed at [facility], [address] [place], [place] [address]   I-Stat Lactic Acid, ED     Status: Abnormal   Collection Time: 09/17/23  1:19 PM  Result Value Ref Range   Lactic Acid, Venous 2.0 (HH) 0.5 - 1.9 mmol/L   Comment NOTIFIED PHYSICIAN   APTT     Status: None   Collection Time: 09/17/23  1:52 PM  Result Value Ref  Range   aPTT 28 24 - 36 seconds    Comment: Performed at [facility], [address] [place], [place] [address]  I-Stat Lactic Acid, ED     Status: None   Collection Time: 09/17/23  2:56 PM  Result Value Ref Range   Lactic Acid, Venous 1.8 0.5 - 1.9 mmol/L   CT FEMUR LEFT W CONTRAST  Result Date: 09/17/2023 CLINICAL DATA:  Left thigh wound EXAM: CT OF THE LOWER LEFT EXTREMITY WITH CONTRAST TECHNIQUE: Multidetector CT imaging of the lower left extremity was performed according to the standard protocol following intravenous contrast administration. RADIATION DOSE REDUCTION: This exam was performed according to the departmental dose-optimization program which includes automated exposure control, adjustment of the mA and/or kV according to patient size and/or use of iterative reconstruction technique. CONTRAST:  75mL OMNIPAQUE IOHEXOL 350 MG/ML SOLN COMPARISON:  X-ray 08/09/2023 FINDINGS: Bones/Joint/Cartilage Near-circumferential proliferative periosteal elevation surrounding the mid left femoral diaphysis at the site of deep soft tissue wound/ulceration. Subtle irregularity of the cortex without well-defined erosion or bony destruction. No fracture. No dislocation. No lytic or sclerotic bone lesion. Small left knee joint effusion is nonspecific. Ligaments Suboptimally assessed by CT. Muscles and Tendons Markedly abnormal appearance of the vastus medialis muscle of the mid to distal left thigh underlying site of ulceration with heterogeneously low attenuation appearance of the muscle with numerous foci of intramuscular air/gas. There is also irregularity of the sartorius muscle at this location. Soft tissues Large soft tissue defect at the  medial aspect of the mid to distal left thigh with absence of the subcutaneous fat. The underlying anterior compartment musculature is exposed at the site of ulceration. There is air tracking to the underlying femur. Surrounding soft tissue edema and ill-defined  fluid. No organized or rim enhancing fluid collection. IMPRESSION: 1. Large soft tissue defect at the medial aspect of the mid to distal left thigh with absence of the subcutaneous fat. The underlying anterior compartment musculature is exposed at the site of ulceration. There is air tracking to the underlying femur. Markedly abnormal appearance of the vastus medialis muscle of the mid to distal left thigh underlying site of ulceration with heterogeneously low attenuation appearance of the muscle with numerous foci of intramuscular air/gas. Findings are concerning for necrotizing myositis. 2. Near-circumferential proliferative periosteal elevation surrounding the mid left femoral diaphysis at the site of deep soft tissue wound/ulceration. Subtle irregularity of the cortex without well-defined erosion or bony destruction. Findings are highly suggestive of early acute osteomyelitis. 3. Small left knee joint effusion is nonspecific. Electronically Signed   By: Duanne Guess D.O.   On: 09/17/2023 15:25   DG Chest Port 1 View  Result Date: 09/17/2023 CLINICAL DATA:  Fall, concern for sepsis EXAM: PORTABLE CHEST 1 VIEW COMPARISON:  Chest x-ray dated August 09, 2023 FINDINGS: Cardiac and mediastinal contours are unchanged. Lungs are clear. No large pleural effusion. No evidence of pneumothorax. Advanced degenerative changes of the left shoulder. IMPRESSION: No active disease. Electronically Signed   By: Allegra Lai M.D.   On: 09/17/2023 13:52    ROS: Open wound to left medial thigh  Physical Exam: Alert and appropriate 77 y/o female in no acute distress Left leg: large open wound to medial left thigh affecting hip adductors and probable sartorius and vastus medialis.  Minimal erythema surrounding the wound with evidence of eschar from chronic wound.  Nv intact distally No signs of lymphadenitis or accompanying cellulitis distally or proximally Full rom of left hip and left knee with mild  tenderness to thigh No signs of dvt or compartment syndrome  Assessment/Plan Assessment: open wound with possible early osteomyelitis of left femur  Plan: Discussed case with Dr. Shon Baton and Dr. Ulice Bold for management Currently patient is afebrile without leukocytosis Due to the significantly large wound, recommend plastic surgery, wound care and infectious disease consulta She will most likely require surgical debridement, partial closure and application of wound vac Plastic surgery agreed to see patient tomorros Recommend wet/dry dressing for now, which was completed and admission by hospitalist for continuation of IV antibiotics.  Expect surgery in the next 2-3 days  Will continue to monitor her progress  Alphonsa Overall PA-C, MPAS [facility] is now [facility] [address], [address], [place], [place] [address] Phone: [phone] [email or web address] Facebook  [employer]
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
## 2023-09-17 NOTE — ED Provider Notes (Signed)
[facility] AT [facility] Provider Note  CSN: [id number] Arrival date & time: 09/17/23 1206  Chief Complaint(s) Wound Infection  HPI Elizabeth Schwartz is a 77 y.o. female here today with a left thigh wound.  Patient had a fall in September, went to a nursing home, [facility], was discharged this week.  Home health nurse went to evaluate the patient today for the first time, saw the wound, advised she go to the emergency department.   Past Medical History Past Medical History:  Diagnosis Date   Anemia    COPD (chronic obstructive pulmonary disease) (HCC)    Dementia (HCC)    Depression    Diabetes mellitus    GERD (gastroesophageal reflux disease)    Hyperlipidemia    Hypertension    Osteoporosis    Schizophrenia, schizo-affective (HCC)    SOB (shortness of breath) on exertion    Syncope 06/11/2019   Patient Active Problem List   Diagnosis Date Noted   Rhabdomyolysis 08/10/2023   AKI (acute kidney injury) (HCC) 08/10/2023   Transaminitis 08/10/2023   CAP (community acquired pneumonia) 08/10/2023   Lactic acidosis 08/10/2023   Elevated troponin 08/10/2023   Pressure injury of skin with suspected deep tissue injury 08/10/2023   Porokeratosis 01/02/2023   Dementia due to Alzheimer's disease (HCC) 08/11/2022   Statin myopathy 04/21/2022   Pain due to onychomycosis of toenails of both feet 02/22/2022   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) 11/22/2021   Chronic pain of right hip 09/15/2021   Chronic pain of left knee 09/15/2021   Chronic left shoulder pain 05/24/2021   Mixed hyperlipidemia 03/22/2021   Pincer nail deformity 11/03/2020   Diabetes type 2, controlled (HCC) 06/11/2019   Schizophrenia (HCC) 03/20/2019   Chronic diarrhea 03/20/2019   Hypertension 08/22/2016   Anemia 08/22/2016   Major neurocognitive disorder due to multiple etiologies with behavioral disturbance (HCC) 08/22/2016   Osteoporosis 08/22/2016   Asthma,  moderate persistent 09/08/2011   Diverticulosis 09/08/2011   Former smoker 09/08/2011   GERD 06/04/2007   Home Medication(s) Prior to Admission medications   Medication Sig Start Date End Date Taking? Authorizing Provider  acetaminophen (TYLENOL) 325 MG tablet Take 2 tablets (650 mg total) by mouth every 6 (six) hours as needed for mild pain (or Fever >/= 101). 08/18/23   Ghimire, Werner Lean, MD  albuterol (VENTOLIN HFA) 108 (90 Base) MCG/ACT inhaler INHALE TWO PUFFS BY MOUTH EVERY 6 HOURS AS NEEDED FOR SHORTNESS OF BREATH 07/13/23   Storm Frisk, MD  alendronate (FOSAMAX) 70 MG tablet Take 1 tablet (70 mg total) by mouth once a week. 07/13/23   Storm Frisk, MD  Blood Glucose Monitoring Suppl [facility] VERIO) w/Device KIT Use to check blood sugar 07/13/23   Storm Frisk, MD  calcium carbonate (OS-CAL) 600 MG TABS Take 600 mg by mouth 2 (two) times daily with a meal.    [provider]  clozapine (CLOZARIL) 200 MG tablet Take 200 mg by mouth at bedtime. 07/28/23   [provider]  donepezil (ARICEPT) 10 MG tablet Take 1 tablet (10 mg total) by mouth at bedtime. 07/13/23   Storm Frisk, MD  ezetimibe (ZETIA) 10 MG tablet Take 1 tablet (10 mg total) by mouth daily. 07/13/23   Storm Frisk, MD  famotidine (PEPCID) 20 MG tablet Take 1 tablet (20 mg total) by mouth 2 (two) times daily. 07/13/23   Storm Frisk, MD  fenofibrate 160 MG tablet  Take 1 tablet (160 mg total) by mouth daily. 07/13/23   Storm Frisk, MD  fish oil-omega-3 fatty acids 1000 MG capsule Take 1 g by mouth every morning.    [provider]  insulin aspart (NOVOLOG FLEXPEN) 100 UNIT/ML FlexPen 0-9 Units, Subcutaneous, 3 times daily with meals CBG < 70: Implement Hypoglycemia measures CBG 70 - 120: 0 units CBG 121 - 150: 1 unit CBG 151 - 200: 2 units CBG 201 - 250: 3 units CBG 251 - 300: 5 units CBG 301 - 350: 7 units CBG 351 - 400: 9 units CBG > 400: call MD 08/18/23   Maretta Bees, MD  Iron, Ferrous Sulfate, 325 (65 Fe) MG TABS Take 325 mg by mouth daily with breakfast. 11/17/22   Storm Frisk, MD  memantine (NAMENDA) 5 MG tablet Take 5 mg by mouth 2 (two) times daily. 06/29/23   [provider]  metFORMIN (GLUCOPHAGE) 500 MG tablet Take 1 tablet (500 mg total) by mouth 2 (two) times daily with a meal. 07/13/23   Storm Frisk, MD  Multiple Vitamins-Minerals (CENTRUM SILVER ULTRA WOMENS PO) Take 1 tablet by mouth daily.    [provider]  OneTouch Delica Lancets 33G MISC Use to check blood sugar daily 07/13/23   Storm Frisk, MD  [facility] VERIO test strip Use as instructed 07/13/23   Storm Frisk, MD  SYMBICORT 160-4.5 MCG/ACT inhaler INHALE TWO PUFFS INTO THE LUNGS IN THE MORNING AND AT BEDTIME 07/13/23   Storm Frisk, MD  vitamin C (ASCORBIC ACID) 500 MG tablet Take 500 mg by mouth every morning.    [provider]  VITAMIN D, CHOLECALCIFEROL, PO Take 600 Units by mouth daily.    [provider]                                                                                                                                    Past Surgical History Past Surgical History:  Procedure Laterality Date   BREAST BIOPSY Right 2017   benign   LAPAROSCOPY  1975   ABD pain   VAGINAL HYSTERECTOMY  1975   Family History Family History  Problem Relation Age of Onset   Breast cancer Neg Hx     Social History Social History   Tobacco Use   Smoking status: Former    Current packs/day: 0.00    Types: Cigarettes    Start date: 02/27/1997    Quit date: 02/28/2012    Years since quitting: 11.5   Smokeless tobacco: Former    Types: Associate Professor status: Never Used  Substance Use Topics   Alcohol use: Not Currently    Comment: "socially"   Drug use: No   Allergies Crestor [rosuvastatin]  Review of Systems Review of Systems  Physical Exam Vital Signs  I have reviewed the triage vital signs BP  129/62  Pulse 99   Temp 98.5 F (36.9 C)   Resp 18   Ht 5\' 5"  (1.651 m)   Wt 74.8 kg   SpO2 100%   BMI 27.46 kg/m   Physical Exam Vitals reviewed.  Cardiovascular:     Rate and Rhythm: Tachycardia present.  Skin:    General: Skin is warm.     Comments: Extensive, deep wound to the left medial thigh.  There is purulence, malodorous.  Neurological:     General: No focal deficit present.     Mental Status: She is alert.     ED Results and Treatments Labs (all labs ordered are listed, but only abnormal results are displayed) Labs Reviewed  CBC WITH DIFFERENTIAL/PLATELET - Abnormal; Notable for the following components:      Result Value   RBC 3.13 (*)    Hemoglobin 8.4 (*)    HCT 28.0 (*)    Platelets 517 (*)    Neutro Abs 7.9 (*)    Abs Immature Granulocytes 0.11 (*)    All other components within normal limits  COMPREHENSIVE METABOLIC PANEL - Abnormal; Notable for the following components:   CO2 21 (*)    Glucose, Bld 117 (*)    Calcium 8.5 (*)    Total Protein 6.0 (*)    Albumin 2.8 (*)    All other components within normal limits  I-STAT CG4 LACTIC ACID, ED - Abnormal; Notable for the following components:   Lactic Acid, Venous 2.0 (*)    All other components within normal limits  RESP PANEL BY RT-PCR (RSV, FLU A&B, COVID)  RVPGX2  CULTURE, BLOOD (ROUTINE X 2)  CULTURE, BLOOD (ROUTINE X 2)  APTT  I-STAT CG4 LACTIC ACID, ED                                                                                                                          Radiology CT FEMUR LEFT W CONTRAST  Result Date: 09/17/2023 CLINICAL DATA:  Left thigh wound EXAM: CT OF THE LOWER LEFT EXTREMITY WITH CONTRAST TECHNIQUE: Multidetector CT imaging of the lower left extremity was performed according to the standard protocol following intravenous contrast administration. RADIATION DOSE REDUCTION: This exam was performed according to the departmental dose-optimization program which includes  automated exposure control, adjustment of the mA and/or kV according to patient size and/or use of iterative reconstruction technique. CONTRAST:  75mL OMNIPAQUE IOHEXOL 350 MG/ML SOLN COMPARISON:  X-ray 08/09/2023 FINDINGS: Bones/Joint/Cartilage Near-circumferential proliferative periosteal elevation surrounding the mid left femoral diaphysis at the site of deep soft tissue wound/ulceration. Subtle irregularity of the cortex without well-defined erosion or bony destruction. No fracture. No dislocation. No lytic or sclerotic bone lesion. Small left knee joint effusion is nonspecific. Ligaments Suboptimally assessed by CT. Muscles and Tendons Markedly abnormal appearance of the vastus medialis muscle of the mid to distal left thigh underlying site of ulceration with heterogeneously low attenuation appearance of the muscle with numerous foci of intramuscular air/gas. There is  also irregularity of the sartorius muscle at this location. Soft tissues Large soft tissue defect at the medial aspect of the mid to distal left thigh with absence of the subcutaneous fat. The underlying anterior compartment musculature is exposed at the site of ulceration. There is air tracking to the underlying femur. Surrounding soft tissue edema and ill-defined fluid. No organized or rim enhancing fluid collection. IMPRESSION: 1. Large soft tissue defect at the medial aspect of the mid to distal left thigh with absence of the subcutaneous fat. The underlying anterior compartment musculature is exposed at the site of ulceration. There is air tracking to the underlying femur. Markedly abnormal appearance of the vastus medialis muscle of the mid to distal left thigh underlying site of ulceration with heterogeneously low attenuation appearance of the muscle with numerous foci of intramuscular air/gas. Findings are concerning for necrotizing myositis. 2. Near-circumferential proliferative periosteal elevation surrounding the mid left femoral  diaphysis at the site of deep soft tissue wound/ulceration. Subtle irregularity of the cortex without well-defined erosion or bony destruction. Findings are highly suggestive of early acute osteomyelitis. 3. Small left knee joint effusion is nonspecific. Electronically Signed   By: Duanne Guess D.O.   On: 09/17/2023 15:25   DG Chest Port 1 View  Result Date: 09/17/2023 CLINICAL DATA:  Fall, concern for sepsis EXAM: PORTABLE CHEST 1 VIEW COMPARISON:  Chest x-ray dated August 09, 2023 FINDINGS: Cardiac and mediastinal contours are unchanged. Lungs are clear. No large pleural effusion. No evidence of pneumothorax. Advanced degenerative changes of the left shoulder. IMPRESSION: No active disease. Electronically Signed   By: Allegra Lai M.D.   On: 09/17/2023 13:52    Pertinent labs & imaging results that were available during my care of the patient were reviewed by me and considered in my medical decision making (see MDM for details).  Medications Ordered in ED Medications  sodium chloride flush (NS) 0.9 % injection 10 mL (10 mLs Intravenous Not Given 09/17/23 1255)  vancomycin (VANCOREADY) IVPB 1500 mg/300 mL (1,500 mg Intravenous New Bag/Given 09/17/23 1356)  piperacillin-tazobactam (ZOSYN) IVPB 3.375 g (has no administration in time range)  iohexol (OMNIPAQUE) 350 MG/ML injection 75 mL (75 mLs Intravenous Contrast Given 09/17/23 1434)                                                                                                                                     Procedures Procedures  (including critical care time)  Medical Decision Making / ED Course   This patient presents to the ED for concern of leg wound, this involves an extensive number of treatment options, and is a complaint that carries with it a high risk of complications and morbidity.  The differential diagnosis includes cellulitis, abscess, soft tissue infection, less likely necrotizing soft tissue  infection.  MDM: 77 year old female here today with a leg wound.  Necrotic tissue, foul odor.  Will treat with antibiotics.  Will  obtain imaging of the patient's leg.  Patient does not have sepsis at this time.  Patient likely will require a specialist consultation to see if debridement is indicated.  Wound appears to not have received appropriate care in the recent past.  Reassessment 3:30 PM-patient's CT imaging shows necrotic myositis.  I have placed a consult with orthopedic surgery, talk to Dr. Shon Baton.  He is going to come and evaluate the patient.  Will admit to hospitalist.  I do not believe patient has sepsis at this time.   Additional history obtained: -Additional history obtained from EMS -External records from outside source obtained and reviewed including: Chart review including previous notes, labs, imaging, consultation notes   Lab Tests: -I ordered, reviewed, and interpreted labs.   The pertinent results include:   Labs Reviewed  CBC WITH DIFFERENTIAL/PLATELET - Abnormal; Notable for the following components:      Result Value   RBC 3.13 (*)    Hemoglobin 8.4 (*)    HCT 28.0 (*)    Platelets 517 (*)    Neutro Abs 7.9 (*)    Abs Immature Granulocytes 0.11 (*)    All other components within normal limits  COMPREHENSIVE METABOLIC PANEL - Abnormal; Notable for the following components:   CO2 21 (*)    Glucose, Bld 117 (*)    Calcium 8.5 (*)    Total Protein 6.0 (*)    Albumin 2.8 (*)    All other components within normal limits  I-STAT CG4 LACTIC ACID, ED - Abnormal; Notable for the following components:   Lactic Acid, Venous 2.0 (*)    All other components within normal limits  RESP PANEL BY RT-PCR (RSV, FLU A&B, COVID)  RVPGX2  CULTURE, BLOOD (ROUTINE X 2)  CULTURE, BLOOD (ROUTINE X 2)  APTT  I-STAT CG4 LACTIC ACID, ED      EKG   EKG Interpretation Date/Time:    Ventricular Rate:    PR Interval:    QRS Duration:    QT Interval:    QTC Calculation:    R Axis:      Text Interpretation:           Imaging Studies ordered: I ordered imaging studies including CT imaging of the leg I independently visualized and interpreted imaging. I agree with the radiologist interpretation   Medicines ordered and prescription drug management: Meds ordered this encounter  Medications   sodium chloride flush (NS) 0.9 % injection 10 mL   vancomycin (VANCOREADY) IVPB 1500 mg/300 mL    Order Specific Question:   Indication:    Answer:   Sepsis   iohexol (OMNIPAQUE) 350 MG/ML injection 75 mL   piperacillin-tazobactam (ZOSYN) IVPB 3.375 g    Order Specific Question:   Antibiotic Indication:    Answer:   Cellulitis    -I have reviewed the patients home medicines and have made adjustments as needed    Cardiac Monitoring: The patient was maintained on a cardiac monitor.  I personally viewed and interpreted the cardiac monitored which showed an underlying rhythm of: Normal sinus rhythm     Reevaluation: After the interventions noted above, I reevaluated the patient and found that they have :improved  Co morbidities that complicate the patient evaluation  Past Medical History:  Diagnosis Date   Anemia    COPD (chronic obstructive pulmonary disease) (HCC)    Dementia (HCC)    Depression    Diabetes mellitus    GERD (gastroesophageal reflux disease)    Hyperlipidemia  Hypertension    Osteoporosis    Schizophrenia, schizo-affective (HCC)    SOB (shortness of breath) on exertion    Syncope 06/11/2019      Dispostion: Admission to hospitalist with orthopedic surgery consultation.    Final Clinical Impression(s) / ED Diagnoses Final diagnoses:  Necrotizing myositis     @PCDICTATION @    Anders Simmonds T, DO 09/17/23 1554

## 2023-09-17 NOTE — ED Notes (Signed)
Pt given Gatorlyte zero to drink per rehydration orders.

## 2023-09-17 NOTE — ED Triage Notes (Signed)
Pt to ER via EMS from home. Pt was released from [facility] yesterday after a short stay post fall at home.  Pt has wound to left inner thigh that she had when she entered [facility] but reports that it has gotten worse.  Home Heath nurse went out for intake this AM and called EMS due to condition of wound.  Pt arrives with wound covered with abd pad placed by [facility] RN.  Wound is deep purulent drainage and foul odor.

## 2023-09-17 NOTE — Assessment & Plan Note (Signed)
Normocytic.  Hemoglobin 8.4, baseline appears to be around 10.  Iron supplement is on medication list but need to confirm she has been taking it.  Anemia panel done 05/31/2022 which was normal but unclear if she was taking iron at this time. -CTM with AM CBC - anemia panel

## 2023-09-17 NOTE — H&P (Cosign Needed)
Hospital Admission History and Physical Service Pager: [phone]  Patient name: Elizabeth Schwartz Medical record number: [id number] Date of Birth: July 21, 1946 Age: 77 y.o. Gender: female  Primary Care Provider: Storm Frisk, MD Consultants: Ortho Code Status: Full code as patient does not have decision-making capacity, need to discuss further with family when able Preferred Emergency Contact:  Contact Information     Name Relation Home Work Mobile   Skeen,[name] Niece   [phone]      Other Contacts     Name Relation Home Work Mobile   Joslin,Tracy (Resident Service Coordinator for [place]) Clearence Cheek  [phone]        Chief Complaint: Left thigh wound   Assessment and Plan: Leanor Voris is a 77 y.o. female presenting with left thigh wound after crush injury last month.  Imaging shows evidence of necrotizing myositis and acute early osteomyelitis.  No systemic symptoms with stable vitals, no leukocytosis on labs and lactic acid downtrending.  Platelets, likely acute phase reactant.  Assessment & Plan Necrotizing myositis Imaging consistent with necrotizing myositis and likely early acute osteomyelitis.  Ortho has been consulted and seen patient.  They plan for surgical debridement in next 2-3 days, plastic surgery (Ortho has consulted), wound care, and will likely need wound VAC.  They recommend wet-to-dry dry dressings prior to surgery.  Patient currently in mild pain. -Patient admitted to MedSurg, attending Dr. McDiarmid -Ortho consulted, follow recs -Plastic surgery consulted, follow recs -consult wound care -Will consult ID tomorrow -IV vanc and zosyn per pharmacy  -Scheduled Tylenol for pain - f/u blood cx Other acute osteomyelitis of left femur (HCC) As above Type 2 diabetes mellitus without complication, unspecified whether long term insulin use (HCC) A1c 8/29 of 6.1.  Home medications include metformin 500 mg twice daily  and she received SSI at SNF. Glucose 117 on admission. -Sensitive SSI with CBGs at meals and before bedtime Dementia, unspecified dementia severity, unspecified dementia type, unspecified whether behavioral, psychotic, or mood disturbance or anxiety (HCC) On chart review, it is Alzheimer's dementia.  She is unable to provide clear history.  I tried to call her niece, Elizabeth Schwartz, to get more information on patient including code status but was unable to reach her.  Home medications listed include memantine 5 mg twice daily and Aricept 10 mg at bedtime. -Delirium precautions -Fall precautions -Will attempt to call niece, Elizabeth Schwartz again to confirm medications and code status -Memantine and Aricept ordered  Anemia, unspecified type Normocytic.  Hemoglobin 8.4, baseline appears to be around 10.  Iron supplement is on medication list but need to confirm she has been taking it.  Anemia panel done 05/31/2022 which was normal but unclear if she was taking iron at this time. -CTM with AM CBC - anemia panel   Chronic and Stable Problems:  HLD-Zetia 10 mg daily, fenofibrate 160 mg daily, not yet started, will confirm Schizophrenia-medication includes clozapine 200 mg at bedtime -ordered COPD-Symbicort 2 puffs twice daily, albuterol as needed -Dulera and albuterol ordered Osteoporosis-alendronate 70 mg once a week on Wednesdays per chart review.  Not ordered   FEN/GI: Regular diet VTE Prophylaxis: Lovenox  Disposition: Admit to FMTS  History of Present Illness:  Elizabeth Schwartz is a 77 y.o. female presenting with left thigh wound. She states she is here because of a wound on her thigh.  When asked about history of stay falling on her leg, she denies this saying that that had happened to her niece, not  her.  She complains of left hip pain and upper back pain.  No other complaints.  History limited due to dementia.  On chart review, pt was hospitalized 9/25-10/02/2023 after a stove fell on her leg causing a  pressure injury of the left thigh.  She was found by neighbors and had rhabdo.  She went to SNF after hospitalization, recently discharged.  Home health RN came to check on her today and saw the wound on the medial left thigh and  prompted patient to go to ED.    In the ED, VSS, labs significant for Hgb 8.4 (down from 10.2 a month ago), Plts elevated 517, LA 2>1.8.  No leukocytosis, no fever. CXR without pathology, CT left femur suggestive of early acute osteomyelitis and necrotizing myositis.  Ortho was consulted and ED with plans for surgical debridement.  Patient was started on IV Zosyn.   Review Of Systems: Per HPI   Pertinent Past Medical History: Recent crush injury with rhabdomyolysis  T2DM, HTN, HLD, Osteoporosis, COPD, Anemia, Schizophrenia, Alzheimer's dementia    Remainder reviewed in history tab.   Pertinent Past Surgical History:    Remainder reviewed in history tab.  Pertinent Social History: Tobacco use: Former - 15 year history, last used in 2013 per chart review Alcohol use: No -Per chart review Other Substance use: No-Per chart review Unsure if she lives with anyone, need to contact niece, Elizabeth Schwartz  Pertinent Family History: None listed  Remainder reviewed in history tab.   Important Outpatient Medications: Clozapine 200 daily Insulin aspart with meals Metformin 500 BID Albuterol, Symbicort Ezetimibe 10 daily Famotidine 20 daily Donepezil 10 daily, Memantine 5 BID Alendronate 70 weekly  Remainder reviewed in medication history.   Objective: BP 129/62   Pulse 99   Temp 98.5 F (36.9 C)   Resp 18   Ht 5\' 5"  (1.651 m)   Wt 74.8 kg   SpO2 100%   BMI 27.46 kg/m  Exam: General: 77 year old female, overall well-appearing, NAD Eyes: White sclera clear conjunctiva ENTM: MMM Neck: Supple Cardiovascular: Slightly tachycardic, regular rhythm, no murmur Respiratory: CTAB Extremities: Left thigh with dressing intact, briefly unwrapped and peaked  underneath gauze to see wound which had been packed with gauze, mild surrounding erythema without warmth.  See photos in ED providers note/media tab of wound.  Trace pitting edema of left lower extremity, no edema right lower extremity.  Derm: Warm and dry.  Wound of medial left thigh as above and  Stage I ulcer of lateral left thigh.  See photo Neuro: Alert, no focal deficits    Labs:  CBC BMET  Recent Labs  Lab 09/17/23 1227  WBC 10.0  HGB 8.4*  HCT 28.0*  PLT 517*   Recent Labs  Lab 09/17/23 1227  NA 137  K 4.0  CL 104  CO2 21*  BUN 19  CREATININE 0.96  GLUCOSE 117*  CALCIUM 8.5*     Lactic acid: 2.0 >> 1.8 APTT: 28 RPP: negative   EKG: Qtc 467. No ischemic findings. (Automatic read says ST elevation?)    Imaging Studies Performed:  CT Femur L:  Large soft tissue defect of left thigh with air tracking to the underlying femur, and markedly abnormal appearance of the vastus medialis muscle with numerous foci of intramuscular air/gas, concerning for necrotizing myositis. Irregularity of cortex without well-defined erosion or bony destruction suggestive of early acute osteomyelitis.  CXR: No acute findings    Erick Alley, DO 09/17/2023, 4:58 PM PGY-3, [facility] Family Medicine  FPTS Intern pager: [phone], text pages welcome Secure chat group [facility] Teaching Service

## 2023-09-17 NOTE — ED Notes (Signed)
Antibiotics were not delayed to until second set of cultures were drawn.  Will have phlebotomist attempt 2nd culture and lactic.

## 2023-09-17 NOTE — ED Notes (Signed)
Ortho pa here

## 2023-09-17 NOTE — ED Notes (Signed)
Pt trying to get up    easily talked into getting up further in the bed  she has a short memory also  her lt leg is swollen and red including her lt foot

## 2023-09-17 NOTE — ED Notes (Signed)
Pt is a and o x 4 but when she answers question she appears confused saying that she is going home

## 2023-09-17 NOTE — Plan of Care (Addendum)
FMTS Brief Progress Note  S: Went to assess patient at bedside for nighttime rounds.  States that she is overall comfortable.  We discussed her course if hospitalization and plans so far. Denies any pain besides callus over foot.  O: BP 111/63 (BP Location: Left Arm)   Pulse 95   Temp 98.8 F (37.1 C) (Oral)   Resp 19   Ht 5\' 5"  (1.651 m)   Wt 74.8 kg   SpO2 98%   BMI 27.46 kg/m    General: Well appearing, NAD, awake, alert, responsive to questions Head: Normocephalic atraumatic CV: Regular rate and rhythm no murmurs rubs or gallops Respiratory: Clear to ausculation bilaterally in anterior lung fields Extremities: LLE with dressing around wound that is CDI, mild swelling of LLE compared to right, no calf tenderness, well perfused  A/P: Necrotizing myositis On my exam had no pain. Messaged by nursing afterwards at 9:17 PM that she has been having 10/10 pain in left leg near wound. -one time dose of oxycodone 2.5 mg for current severe pain-monitor mental status closely -continue tylenol scheduled -IV vanc/zosyn  Rest per H&P  - Orders reviewed. Labs for AM ordered, which was adjusted as needed.  - If condition changes, plan includes   Levin Erp, MD 09/17/2023, 9:26 PM PGY-3, [facility] Night Resident  Please page [phone] with questions.

## 2023-09-17 NOTE — Progress Notes (Signed)
ED Pharmacy Antibiotic Sign Off An antibiotic consult was received from an ED provider for vancomycin per pharmacy dosing for sepsis/cellulitis. A chart review was completed to assess appropriateness.   The following one time order(s) were placed:  Vancomycin 1500 mg IV x 1  Further antibiotic and/or antibiotic pharmacy consults should be ordered by the admitting provider if indicated.   Thank you for allowing pharmacy to be a part of this patient's care.   [name], [facility]  Clinical Pharmacist 09/17/23 12:53 PM

## 2023-09-17 NOTE — Progress Notes (Signed)
Pharmacy Antibiotic Note  Elizabeth Schwartz is a 77 y.o. female admitted on 09/17/2023 presenting with open thigh wound with, CT concerning for necrotizing myositis.  Pharmacy has been consulted for vancomycin and zosyn dosing.  Vancomycin 1500 mg IV x 1 given in ED  Plan: Vancomycin 1g IV q 24h (eAUC 452) Zosyn 3.375g IV q 8h (extended 4h infusion) Monitor renal function, surgical plan/Cx and clinical progression to narrow Vancomycin levels as indicated  Height: 5\' 5"  (165.1 cm) Weight: 74.8 kg (165 lb) IBW/kg (Calculated) : 57  Temp (24hrs), Avg:98.6 F (37 C), Min:98.5 F (36.9 C), Max:98.7 F (37.1 C)  Recent Labs  Lab 09/17/23 1227 09/17/23 1319 09/17/23 1456  WBC 10.0  --   --   CREATININE 0.96  --   --   LATICACIDVEN  --  2.0* 1.8    Estimated Creatinine Clearance: 49.7 mL/min (by C-G formula based on SCr of 0.96 mg/dL).    Allergies  Allergen Reactions   Crestor [Rosuvastatin] Other (See Comments)    Muscle aches.     [name], PharmD, [facility] Clinical Pharmacist ED Pharmacist Phone # [phone] 09/17/2023 7:00 PM

## 2023-09-17 NOTE — ED Notes (Signed)
ED TO INPATIENT HANDOFF REPORT  ED Nurse Name and Phone #: Theophilus Bones [phone]  S Name/Age/Gender Elizabeth Schwartz 77 y.o. female Room/Bed: 043C/043C  Code Status   Code Status: Prior  Home/SNF/Other Home Patient oriented to: self, place, time, and situation Is this baseline?  Pt noted with periods of confusion, disoriented to place.  Triage Complete: Triage complete  Chief Complaint Leg Wound Infection  Triage Note Pt to ER via EMS from home. Pt was released from [facility] yesterday after a short stay post fall at home.  Pt has wound to left inner thigh that she had when she entered [facility] but reports that it has gotten worse.  Home [facility] nurse went out for intake this AM and called EMS due to condition of wound.  Pt arrives with wound covered with abd pad placed by [facility] RN.  Wound is deep purulent drainage and foul odor.     Allergies Allergies  Allergen Reactions   Crestor [Rosuvastatin] Other (See Comments)    Muscle aches.     Level of Care/Admitting Diagnosis ED Disposition     ED Disposition  Admit   Condition  --   Comment  The patient appears reasonably stabilized for admission considering the current resources, flow, and capabilities available in the ED at this time, and I doubt any other [facility] requiring further screening and/or treatment in the ED prior to admission is  present.          B Medical/Surgery History Past Medical History:  Diagnosis Date   Anemia    COPD (chronic obstructive pulmonary disease) (HCC)    Dementia (HCC)    Depression    Diabetes mellitus    GERD (gastroesophageal reflux disease)    Hyperlipidemia    Hypertension    Osteoporosis    Schizophrenia, schizo-affective (HCC)    SOB (shortness of breath) on exertion    Syncope 06/11/2019   Past Surgical History:  Procedure Laterality Date   BREAST BIOPSY Right 2017   benign   LAPAROSCOPY  1975   ABD pain   VAGINAL HYSTERECTOMY  1975     A IV  Location/Drains/Wounds Patient Lines/Drains/Airways Status     Active Line/Drains/Airways     Name Placement date Placement time Site Days   Peripheral IV 09/17/23 20 G Left;Posterior Hand 09/17/23  1243  Hand  less than 1   Wound / Incision (Open or Dehisced) 08/10/23 Non-pressure wound Thigh Anterior;Distal;Left 08/10/23  2000  Thigh  38   Wound / Incision (Open or Dehisced) 08/10/23  Shoulder Left;Medial 08/10/23  2000  Shoulder  38   Wound / Incision (Open or Dehisced) 08/10/23 Hip Left;Proximal;Medial 08/10/23  2000  Hip  38            Intake/Output Last 24 hours No intake or output data in the 24 hours ending 09/17/23 1753  Labs/Imaging Results for orders placed or performed during the hospital encounter of 09/17/23 (from the past 48 hour(s))  CBC with Differential     Status: Abnormal   Collection Time: 09/17/23 12:27 PM  Result Value Ref Range   WBC 10.0 4.0 - 10.5 K/uL   RBC 3.13 (L) 3.87 - 5.11 MIL/uL   Hemoglobin 8.4 (L) 12.0 - 15.0 g/dL   HCT 95.2 (L) 84.1 - 32.4 %   MCV 89.5 80.0 - 100.0 fL   MCH 26.8 26.0 - 34.0 pg   MCHC 30.0 30.0 - 36.0 g/dL   RDW 40.1 02.7 - 25.3 %   Platelets  517 (H) 150 - 400 K/uL   nRBC 0.0 0.0 - 0.2 %   Neutrophils Relative % 80 %   Neutro Abs 7.9 (H) 1.7 - 7.7 K/uL   Lymphocytes Relative 11 %   Lymphs Abs 1.1 0.7 - 4.0 K/uL   Monocytes Relative 7 %   Monocytes Absolute 0.7 0.1 - 1.0 K/uL   Eosinophils Relative 1 %   Eosinophils Absolute 0.1 0.0 - 0.5 K/uL   Basophils Relative 0 %   Basophils Absolute 0.0 0.0 - 0.1 K/uL   Immature Granulocytes 1 %   Abs Immature Granulocytes 0.11 (H) 0.00 - 0.07 K/uL    Comment: Performed at [facility], [address], [place], [place] [address]  Comprehensive metabolic panel     Status: Abnormal   Collection Time: 09/17/23 12:27 PM  Result Value Ref Range   Sodium 137 135 - 145 mmol/L   Potassium 4.0 3.5 - 5.1 mmol/L   Chloride 104 98 - 111 mmol/L   CO2 21 (L) 22 - 32 mmol/L    Glucose, Bld 117 (H) 70 - 99 mg/dL    Comment: Glucose reference range applies only to samples taken after fasting for at least 8 hours.   BUN 19 8 - 23 mg/dL   Creatinine, Ser 3.08 0.44 - 1.00 mg/dL   Calcium 8.5 (L) 8.9 - 10.3 mg/dL   Total Protein 6.0 (L) 6.5 - 8.1 g/dL   Albumin 2.8 (L) 3.5 - 5.0 g/dL   AST 24 15 - 41 U/L   ALT 18 0 - 44 U/L   Alkaline Phosphatase 49 38 - 126 U/L   Total Bilirubin 0.4 0.3 - 1.2 mg/dL   GFR, Estimated >65 >78 mL/min    Comment: (NOTE) Calculated using the CKD-EPI Creatinine Equation (2021)    Anion gap 12 5 - 15    Comment: Performed at [facility], [address], [place], [place] [address]  Resp panel by RT-PCR (RSV, Flu A&B, Covid) Anterior Nasal Swab     Status: None   Collection Time: 09/17/23 12:51 PM   Specimen: Anterior Nasal Swab  Result Value Ref Range   SARS Coronavirus 2 by RT PCR NEGATIVE NEGATIVE   Influenza A by PCR NEGATIVE NEGATIVE   Influenza B by PCR NEGATIVE NEGATIVE    Comment: (NOTE) The Xpert Xpress SARS-CoV-2/FLU/RSV plus assay is intended as an aid in the diagnosis of influenza from Nasopharyngeal swab specimens and should not be used as a sole basis for treatment. Nasal washings and aspirates are unacceptable for Xpert Xpress SARS-CoV-2/FLU/RSV testing.  Fact Sheet for Patients: BloggerCourse.com  Fact Sheet for Healthcare Providers: SeriousBroker.it  This test is not yet approved or cleared by the [place] FDA and has been authorized for detection and/or diagnosis of SARS-CoV-2 by FDA under an Emergency Use Authorization (EUA). This EUA will remain in effect (meaning this test can be used) for the duration of the COVID-19 declaration under Section 564(b)(1) of the Act, 21 U.S.C. section 360bbb-3(b)(1), unless the authorization is terminated or revoked.     Resp Syncytial Virus by PCR NEGATIVE NEGATIVE    Comment: (NOTE) Fact Sheet for  Patients: BloggerCourse.com  Fact Sheet for Healthcare Providers: SeriousBroker.it  This test is not yet approved or cleared by the [place] FDA and has been authorized for detection and/or diagnosis of SARS-CoV-2 by FDA under an Emergency Use Authorization (EUA). This EUA will remain in effect (meaning this test can be used) for the duration  of the COVID-19 declaration under Section 564(b)(1) of the Act, 21 U.S.C. section 360bbb-3(b)(1), unless the authorization is terminated or revoked.  Performed at [facility], [address] [place], [place] [address]   I-Stat Lactic Acid, ED     Status: Abnormal   Collection Time: 09/17/23  1:19 PM  Result Value Ref Range   Lactic Acid, Venous 2.0 (HH) 0.5 - 1.9 mmol/L   Comment NOTIFIED PHYSICIAN   APTT     Status: None   Collection Time: 09/17/23  1:52 PM  Result Value Ref Range   aPTT 28 24 - 36 seconds    Comment: Performed at [facility], [address], [place], [place] [address]  I-Stat Lactic Acid, ED     Status: None   Collection Time: 09/17/23  2:56 PM  Result Value Ref Range   Lactic Acid, Venous 1.8 0.5 - 1.9 mmol/L   CT FEMUR LEFT W CONTRAST  Result Date: 09/17/2023 CLINICAL DATA:  Left thigh wound EXAM: CT OF THE LOWER LEFT EXTREMITY WITH CONTRAST TECHNIQUE: Multidetector CT imaging of the lower left extremity was performed according to the standard protocol following intravenous contrast administration. RADIATION DOSE REDUCTION: This exam was performed according to the departmental dose-optimization program which includes automated exposure control, adjustment of the mA and/or kV according to patient size and/or use of iterative reconstruction technique. CONTRAST:  75mL OMNIPAQUE IOHEXOL 350 MG/ML SOLN COMPARISON:  X-ray 08/09/2023 FINDINGS: Bones/Joint/Cartilage Near-circumferential proliferative periosteal elevation surrounding the mid left femoral diaphysis  at the site of deep soft tissue wound/ulceration. Subtle irregularity of the cortex without well-defined erosion or bony destruction. No fracture. No dislocation. No lytic or sclerotic bone lesion. Small left knee joint effusion is nonspecific. Ligaments Suboptimally assessed by CT. Muscles and Tendons Markedly abnormal appearance of the vastus medialis muscle of the mid to distal left thigh underlying site of ulceration with heterogeneously low attenuation appearance of the muscle with numerous foci of intramuscular air/gas. There is also irregularity of the sartorius muscle at this location. Soft tissues Large soft tissue defect at the medial aspect of the mid to distal left thigh with absence of the subcutaneous fat. The underlying anterior compartment musculature is exposed at the site of ulceration. There is air tracking to the underlying femur. Surrounding soft tissue edema and ill-defined fluid. No organized or rim enhancing fluid collection. IMPRESSION: 1. Large soft tissue defect at the medial aspect of the mid to distal left thigh with absence of the subcutaneous fat. The underlying anterior compartment musculature is exposed at the site of ulceration. There is air tracking to the underlying femur. Markedly abnormal appearance of the vastus medialis muscle of the mid to distal left thigh underlying site of ulceration with heterogeneously low attenuation appearance of the muscle with numerous foci of intramuscular air/gas. Findings are concerning for necrotizing myositis. 2. Near-circumferential proliferative periosteal elevation surrounding the mid left femoral diaphysis at the site of deep soft tissue wound/ulceration. Subtle irregularity of the cortex without well-defined erosion or bony destruction. Findings are highly suggestive of early acute osteomyelitis. 3. Small left knee joint effusion is nonspecific. Electronically Signed   By: Duanne Guess D.O.   On: 09/17/2023 15:25   DG Chest Port 1  View  Result Date: 09/17/2023 CLINICAL DATA:  Fall, concern for sepsis EXAM: PORTABLE CHEST 1 VIEW COMPARISON:  Chest x-ray dated August 09, 2023 FINDINGS: Cardiac and mediastinal contours are unchanged. Lungs are clear. No large pleural effusion. No evidence of pneumothorax. Advanced  degenerative changes of the left shoulder. IMPRESSION: No active disease. Electronically Signed   By: Allegra Lai M.D.   On: 09/17/2023 13:52    Pending Labs Unresulted Labs (From admission, onward)     Start     Ordered   09/17/23 1251  Blood Culture (routine x 2)  (Septic presentation on arrival (screening labs, nursing and treatment orders for obvious sepsis))  BLOOD CULTURE X 2,   STAT      09/17/23 1251            Vitals/Pain Today's Vitals   09/17/23 1415 09/17/23 1518 09/17/23 1519 09/17/23 1627  BP: 129/62     Pulse: 99     Resp: 18 18    Temp:  98.5 F (36.9 C)    SpO2: 100%     Weight:      Height:      PainSc:   0-No pain 0-No pain    Isolation Precautions No active isolations  Medications Medications  sodium chloride flush (NS) 0.9 % injection 10 mL (10 mLs Intravenous Not Given 09/17/23 1255)  vancomycin (VANCOREADY) IVPB 1500 mg/300 mL (0 mg Intravenous Stopped 09/17/23 1615)  iohexol (OMNIPAQUE) 350 MG/ML injection 75 mL (75 mLs Intravenous Contrast Given 09/17/23 1434)  piperacillin-tazobactam (ZOSYN) IVPB 3.375 g (0 g Intravenous Stopped 09/17/23 1701)    Mobility walks     Focused Assessments Integumentary   R Recommendations: See Admitting Provider Note  Report given to:   Additional Notes:

## 2023-09-17 NOTE — ED Notes (Signed)
Admitting doctor at bedside 

## 2023-09-18 ENCOUNTER — Inpatient Hospital Stay (HOSPITAL_COMMUNITY): Payer: Medicare Other

## 2023-09-18 DIAGNOSIS — M60052 Infective myositis, left thigh: Secondary | ICD-10-CM

## 2023-09-18 DIAGNOSIS — F209 Schizophrenia, unspecified: Secondary | ICD-10-CM | POA: Diagnosis not present

## 2023-09-18 DIAGNOSIS — Z79899 Other long term (current) drug therapy: Secondary | ICD-10-CM

## 2023-09-18 DIAGNOSIS — S81802A Unspecified open wound, left lower leg, initial encounter: Secondary | ICD-10-CM | POA: Diagnosis not present

## 2023-09-18 DIAGNOSIS — M60852 Other myositis, left thigh: Secondary | ICD-10-CM | POA: Diagnosis present

## 2023-09-18 DIAGNOSIS — L89224 Pressure ulcer of left hip, stage 4: Secondary | ICD-10-CM | POA: Diagnosis not present

## 2023-09-18 DIAGNOSIS — M868X5 Other osteomyelitis, thigh: Secondary | ICD-10-CM | POA: Diagnosis not present

## 2023-09-18 DIAGNOSIS — M869 Osteomyelitis, unspecified: Secondary | ICD-10-CM

## 2023-09-18 LAB — CBC
HCT: 23.8 % — ABNORMAL LOW (ref 36.0–46.0)
Hemoglobin: 7.2 g/dL — ABNORMAL LOW (ref 12.0–15.0)
MCH: 26.8 pg (ref 26.0–34.0)
MCHC: 30.3 g/dL (ref 30.0–36.0)
MCV: 88.5 fL (ref 80.0–100.0)
Platelets: 470 10*3/uL — ABNORMAL HIGH (ref 150–400)
RBC: 2.69 MIL/uL — ABNORMAL LOW (ref 3.87–5.11)
RDW: 15.7 % — ABNORMAL HIGH (ref 11.5–15.5)
WBC: 6.3 10*3/uL (ref 4.0–10.5)
nRBC: 0 % (ref 0.0–0.2)

## 2023-09-18 LAB — GLUCOSE, CAPILLARY
Glucose-Capillary: 110 mg/dL — ABNORMAL HIGH (ref 70–99)
Glucose-Capillary: 98 mg/dL (ref 70–99)
Glucose-Capillary: 126 mg/dL — ABNORMAL HIGH (ref 70–99)

## 2023-09-18 LAB — BASIC METABOLIC PANEL
Anion gap: 11 (ref 5–15)
BUN: 13 mg/dL (ref 8–23)
CO2: 21 mmol/L — ABNORMAL LOW (ref 22–32)
Calcium: 8.4 mg/dL — ABNORMAL LOW (ref 8.9–10.3)
Chloride: 108 mmol/L (ref 98–111)
Creatinine, Ser: 0.98 mg/dL (ref 0.44–1.00)
GFR, Estimated: 59 mL/min — ABNORMAL LOW (ref >60)
Glucose, Bld: 113 mg/dL — ABNORMAL HIGH (ref 70–99)
Potassium: 3.8 mmol/L (ref 3.5–5.1)
Sodium: 140 mmol/L (ref 135–145)

## 2023-09-18 LAB — SURGICAL PCR SCREEN
MRSA, PCR: NEGATIVE
Staphylococcus aureus: NEGATIVE

## 2023-09-18 LAB — FERRITIN: Ferritin: 139 ng/mL (ref 11–307)

## 2023-09-18 LAB — IRON AND TIBC
Iron: 19 ug/dL — ABNORMAL LOW (ref 28–170)
Saturation Ratios: 9 % — ABNORMAL LOW (ref 10.4–31.8)
TIBC: 213 ug/dL — ABNORMAL LOW (ref 250–450)
UIBC: 194 ug/dL

## 2023-09-18 MED ORDER — ADULT MULTIVITAMIN W/MINERALS CH
1.0000 | ORAL_TABLET | Freq: Every day | ORAL | Status: DC
  Administered 2023-09-18 – 2023-11-01 (×42): 1 via ORAL
  Filled 2023-09-18 (×43): qty 1

## 2023-09-18 MED ORDER — VITAMIN C 500 MG PO TABS
250.0000 mg | ORAL_TABLET | Freq: Two times a day (BID) | ORAL | Status: DC
  Administered 2023-09-18 – 2023-11-01 (×86): 250 mg via ORAL
  Filled 2023-09-18 (×87): qty 1

## 2023-09-18 MED ORDER — LINEZOLID 600 MG PO TABS
600.0000 mg | ORAL_TABLET | Freq: Two times a day (BID) | ORAL | Status: DC
  Administered 2023-09-18 – 2023-09-29 (×21): 600 mg via ORAL
  Filled 2023-09-18 (×24): qty 1

## 2023-09-18 NOTE — Consult Note (Signed)
WOC consulted for thigh wound, highly suspect of osteomyelitis with large area of necrotic tissue.  Ortho PA saw patient and recommended wet to dry dressings for now. To be evaluated by ortho and plastics for surgical intervention. No further topical care at this time. For that reason will not consult.   Re consult if needed, will not follow at this time. Thanks  Alwaleed Obeso [employer], RN,CWOCN, CNS, CWON-AP [phone])

## 2023-09-18 NOTE — Evaluation (Signed)
Physical Therapy Evaluation Patient Details Name: Elizabeth Schwartz MRN: [id number] DOB: May 31, 1946 Today's Date: 09/18/2023  History of Present Illness  Pt is a 77 y/o F admitted on 09/17/23 after being advised by [facility] nurse to go to the ED after assessing pt's wound. CT showed necrotic myositis, pt also found to have acute osteomyelitis. PMH: anemia, COPD, dementia, depression, DM, HLD, HTN, osteoporosis, schizo-affective, SOB, syncope  Clinical Impression  Pt seen for PT evaluation with pt asleep but awakened & agreeable to tx. Pt is AxOx4 during session but appears to have decreased memory, sustained attention, initiation, & following commands. Pt denies falls in the past 6 months, not recalling fall leading to previous admission & initial LLE injury. Pt requires min assist overall for bed mobility & transfers with RW. Pt demonstrates decreased weight bearing through LLE during stand pivot, relying on RW. Pt politely declines further mobility attempts 2/2 pain & fatigue. Will continue to follow pt acutely to progress mobility as able.         If plan is discharge home, recommend the following: A little help with walking and/or transfers;A little help with bathing/dressing/bathroom;Assistance with cooking/housework;Supervision due to cognitive status;Direct supervision/assist for financial management;Assist for transportation;Help with stairs or ramp for entrance;Direct supervision/assist for medications management   Can travel by private vehicle   Yes    Equipment Recommendations Rolling walker (2 wheels);BSC/3in1  Recommendations for Other Services       Functional Status Assessment Patient has had a recent decline in their functional status and demonstrates the ability to make significant improvements in function in a reasonable and predictable amount of time.     Precautions / Restrictions Precautions Precautions: Fall Restrictions Weight Bearing Restrictions: No      Mobility   Bed Mobility Overal bed mobility: Needs Assistance Bed Mobility: Supine to Sit     Supine to sit: Min assist, HOB elevated, Used rails     General bed mobility comments: frequent cuing to initiate, ongoing cuing to continue mobilizing as pt will start then stop.    Transfers Overall transfer level: Needs assistance Equipment used: Rolling walker (2 wheels) Transfers: Sit to/from Stand, Bed to chair/wheelchair/BSC Sit to Stand: Min assist   Step pivot transfers: Min assist       General transfer comment: PT provides max multimodal cuing for hand placement during STS but pt with decreased ability to follow instructions, instead transferring STS with BUE on RW. Pt transfers bed>recliner on R with RW & min assist, decreased weight shifting to LLE 2/2 pain.    Ambulation/Gait Ambulation/Gait assistance:  (pt declines.)                Stairs            Wheelchair Mobility     Tilt Bed    Modified Rankin (Stroke Patients Only)       Balance Overall balance assessment: Needs assistance Sitting-balance support: Feet supported Sitting balance-Leahy Scale: Fair Sitting balance - Comments: supervision static sitting   Standing balance support: Bilateral upper extremity supported, During functional activity, Reliant on assistive device for balance Standing balance-Leahy Scale: Fair                               Pertinent Vitals/Pain Pain Assessment Pain Assessment: Faces Faces Pain Scale: Hurts a little bit Pain Location: LLE with movement Pain Descriptors / Indicators: Grimacing, Guarding Pain Intervention(s): Monitored during session, Limited activity within patient's  tolerance    Home Living Family/patient expects to be discharged to:: Private residence Living Arrangements: Alone   Type of Home: Apartment Home Access: Level entry       Home Layout: One level Home Equipment: Shower seat;Grab bars - toilet;Rollator (4 wheels)       Prior Function               Mobility Comments: Reports she's ambulatory without AD, denies falls in the past 6 months despite chart noting pt had fall leading to past admission. ADLs Comments: Reports she cooks & cleans.     Extremity/Trunk Assessment   Upper Extremity Assessment Upper Extremity Assessment: Generalized weakness    Lower Extremity Assessment Lower Extremity Assessment: Generalized weakness;LLE deficits/detail LLE Deficits / Details: L thigh edema noted, ace wrap around thigh       Communication   Communication Communication: No apparent difficulties  Cognition Arousal: Alert (asleep but awakened & able to participate.) Behavior During Therapy: WFL for tasks assessed/performed Overall Cognitive Status: History of cognitive impairments - at baseline                                 General Comments: AxOx4 during session, per chart pt with hx of dementia. Pt does have decreased initiation, decreased sustain attentioned, decreased motor planning, decreased ability to follow simple cuing throughout session.        General Comments General comments (skin integrity, edema, etc.): Pt c/o fatigue & generally feeling unwell.    Exercises     Assessment/Plan    PT Assessment Patient needs continued PT services  PT Problem List Decreased strength;Pain;Decreased range of motion;Decreased cognition;Decreased activity tolerance;Decreased balance;Decreased mobility;Decreased knowledge of precautions;Decreased safety awareness;Decreased knowledge of use of DME       PT Treatment Interventions DME instruction;Balance training;Neuromuscular re-education;Gait training;Stair training;Functional mobility training;Therapeutic activities;Therapeutic exercise;Manual techniques;Patient/family education;Modalities;Cognitive remediation    PT Goals (Current goals can be found in the Care Plan section)  Acute Rehab PT Goals Patient Stated Goal: none stated PT  Goal Formulation: With patient Time For Goal Achievement: 10/02/23 Potential to Achieve Goals: Fair    Frequency Min 1X/week     Co-evaluation               AM-PAC PT "6 Clicks" Mobility  Outcome Measure Help needed turning from your back to your side while in a flat bed without using bedrails?: A Little Help needed moving from lying on your back to sitting on the side of a flat bed without using bedrails?: A Little Help needed moving to and from a bed to a chair (including a wheelchair)?: A Little Help needed standing up from a chair using your arms (e.g., wheelchair or bedside chair)?: A Little Help needed to walk in hospital room?: A Little Help needed climbing 3-5 steps with a railing? : A Lot 6 Click Score: 17    End of Session   Activity Tolerance: Patient limited by fatigue;Patient limited by pain Patient left: in chair;with chair alarm set;with call bell/phone within reach (dietitian entering room)   PT Visit Diagnosis: Muscle weakness (generalized) (M62.81);Pain;Other abnormalities of gait and mobility (R26.89);Difficulty in walking, not elsewhere classified (R26.2) Pain - Right/Left: Left Pain - part of body: Leg    Time: 1052-1110 PT Time Calculation (min) (ACUTE ONLY): 18 min   Charges:   PT Evaluation $PT Eval Low Complexity: 1 Low   PT General Charges $$ ACUTE PT VISIT:  1 Visit         Aleda Grana, PT, DPT 09/18/23, 11:21 AM   Sandi Mariscal 09/18/2023, 11:20 AM

## 2023-09-18 NOTE — Plan of Care (Signed)

## 2023-09-18 NOTE — TOC Initial Note (Signed)
Transition of Care [facility]) - Initial/Assessment Note    Patient Details  Name: Elizabeth Schwartz MRN: [id number] Date of Birth: 1946-10-06  Transition of Care [facility]) CM/SW Contact:    Michaela Corner, LCSWA Phone Number: 09/18/2023, 3:58 PM  Clinical Narrative:      CSW met pt in her room sitting to discuss PT recs for SNF. CSW explained the process of referring the pt to SNF and stated that she will just need to pick a facility once SNF list is given. Pt stated she understood and is open to SNF placement as of now. Pt gave no preference of a SNF at this time.   CSW unable to get PASRR at this time as an existing screening from [facility] is pending. CSW called [facility] at 1:10PM and 3:58PM to ask for pending screening to be removed. Customer service rep submitted a request twice for the screening to be removed; she also stated to check back in the morning.           Expected Discharge Plan: Skilled Nursing Facility Barriers to Discharge: Continued Medical Work up, Other (must enter comment) (Waiting for PASRR to remove existing submission of screening)   Patient Goals and CMS Choice Patient states their goals for this hospitalization and ongoing recovery are:: To get out out the hospital          Expected Discharge Plan and Services                                              Prior Living Arrangements/Services     Patient language and need for interpreter reviewed:: Yes        Need for Family Participation in Patient Care: No (Comment) Care giver support system in place?: No (comment)   Criminal Activity/Legal Involvement Pertinent to Current Situation/Hospitalization: No - Comment as needed  Activities of Daily Living   ADL Screening (condition at time of admission) Independently performs ADLs?: No Does the patient have a NEW difficulty with bathing/dressing/toileting/self-feeding that is expected to last >3 days?: Yes (Initiates  electronic notice to provider for possible OT consult) Does the patient have a NEW difficulty with getting in/out of bed, walking, or climbing stairs that is expected to last >3 days?: Yes (Initiates electronic notice to provider for possible PT consult) Does the patient have a NEW difficulty with communication that is expected to last >3 days?: No Is the patient deaf or have difficulty hearing?: No Does the patient have difficulty seeing, even when wearing glasses/contacts?: No Does the patient have difficulty concentrating, remembering, or making decisions?: No  Permission Sought/Granted Permission sought to share information with : Case Manager Permission granted to share information with : Yes, Verbal Permission Granted              Emotional Assessment Appearance:: Appears stated age Attitude/Demeanor/Rapport: Engaged Affect (typically observed): Pleasant, Calm Orientation: :  (Not charted in flowsheet) Alcohol / Substance Use: Not Applicable Psych Involvement: No (comment)  Admission diagnosis:  Necrotizing myositis [M60.80] Leg wound, left [S81.802A] Anemia, unspecified type [D64.9] Other acute osteomyelitis of left femur (HCC) [M86.152] Type 2 diabetes mellitus without complication, unspecified whether long term insulin use (HCC) [E11.9] Dementia, unspecified dementia severity, unspecified dementia type, unspecified whether behavioral, psychotic, or mood disturbance or anxiety (HCC) [F03.90] Patient Active Problem List   Diagnosis Date Noted  Long term current use of clozapine 09/18/2023   Myositis of left thigh 09/18/2023   Osteomyelitis of left femur (HCC) 09/18/2023   Pressure injury of left thigh, stage 4 (HCC) 09/18/2023   Pressure injury of deep tissue of left thigh 09/17/2023   Rhabdomyolysis 08/10/2023   AKI (acute kidney injury) (HCC) 08/10/2023   Transaminitis 08/10/2023   CAP (community acquired pneumonia) 08/10/2023   Lactic acidosis 08/10/2023   Elevated  troponin 08/10/2023   Pressure injury of skin with suspected deep tissue injury 08/10/2023   Porokeratosis 01/02/2023   Dementia due to Alzheimer's disease (HCC) 08/11/2022   Statin myopathy 04/21/2022   Pain due to onychomycosis of toenails of both feet 02/22/2022   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) 11/22/2021   Chronic pain of right hip 09/15/2021   Chronic pain of left knee 09/15/2021   Chronic left shoulder pain 05/24/2021   Mixed hyperlipidemia 03/22/2021   Pincer nail deformity 11/03/2020   Diabetes type 2, controlled (HCC) 06/11/2019   Schizophrenia (HCC) 03/20/2019   Chronic diarrhea 03/20/2019   Hypertension 08/22/2016   Anemia 08/22/2016   Major neurocognitive disorder due to multiple etiologies with behavioral disturbance (HCC) 08/22/2016   Osteoporosis 08/22/2016   Asthma, moderate persistent 09/08/2011   Diverticulosis 09/08/2011   Former smoker 09/08/2011   GERD 06/04/2007   PCP:  Storm Frisk, MD Pharmacy:   [facility] - [place], [place] - [address] [place] [address] Phone: [phone] Fax: [phone]  [facility] [place], [place] - [address] [facility] [address] [place] [address] Phone: [phone] Fax: [phone]  [facility] [address] [place] [address] Phone: [phone] Fax: [phone]     Social Determinants of Health (SDOH) Social History: SDOH Screenings   Food Insecurity: No Food Insecurity (09/18/2023)  Housing: Patient Unable To Answer (09/18/2023)  Transportation Needs: No Transportation Needs (09/18/2023)  Utilities: Not At Risk (09/18/2023)  Alcohol Screen: Low Risk  (03/22/2023)  Depression (PHQ2-9): Low Risk  (07/13/2023)  Financial Resource Strain: Low Risk  (03/22/2023)  Physical Activity: Insufficiently Active (03/22/2023)  Social Connections: Socially Isolated (03/22/2023)  Stress: No Stress  Concern Present (03/22/2023)  Tobacco Use: Medium Risk (09/17/2023)   SDOH Interventions:     Readmission Risk Interventions     No data to display

## 2023-09-18 NOTE — Consult Note (Signed)
[facility]                                                                                        Patient Identification: Patient Name: Elizabeth Schwartz MRN: [id number] Admit Date: 09/17/2023 12:06 PM Today's Date: 09/18/2023 Reason for consult: Delorise Royals, ostoemyelitis  Requesting provider: Dr McDiarmid  Principal Problem:   Leg wound, left Active Problems:   Long term current use of clozapine   Antibiotics:  Vancomycin 11/3- Zosyn 11/3-  Lines/Hardware:  Assessment 77 year old female with history of dementia and DM admitted with   # Worsening left inner thigh wound/compressive deep tissue injury  # CT with concerns of necrotizing myositis/ acute osteomyelitis of left femur - Orthopedics was consulted in the ED who in turn have consulted plastic surgery.  She is afebrile without leukocytosis.  Plan for surgical debridement, partial closure and application of wound VAC.  Expect surgery in next 2 to 3 days  Recommendations  - Will change vancomycin to linezolid to cover MRSA and anti toxin effect, continue zosyn - Fu blood cx - Fu Orthopedics and plastics surgery recommendations. Recommend to send cultures as appropriate to guide abtx - Monitor CBC, CMP and signs of sepsis - left foot Xray ordered  - continue wound care - Following  Rest of the management as per the primary team. Please call with questions or concerns.  Thank you for the consult  __________________________________________________________________________________________________________ HPI and Hospital Course: 77 year old female with PMH as below including DM2,  dementia who presented to ED on 7/3 for worsening wound in the left inner thigh with deep, purulent and foul-smelling drainage. She was recently admitted 9/25-10/4 when she presented with fall, neighbors found her pinned to the ground with stove on top of  her, found to have compressive injury to left thigh/rhabdomyolysis. Seen by Ortho and neuro last admission with improvement of swelling and movement on Left lower extremity prior to discharge.  She reports unable to move independently.   At ED afebrile, no signs of sepsis Labs remarkable for albumin 2.8 WBC 10, hemoglobin 8.4 Imagings as below  ROS: General- Denies fever, chills, loss of appetite and loss of weight HEENT - Denies headache, blurry vision, neck pain, sinus pain Chest - Denies any chest pain, SOB or cough CVS- Denies any dizziness/lightheadedness, syncopal attacks, palpitations Abdomen- Denies any nausea, vomiting, abdominal pain, hematochezia and diarrhea Neuro - Denies any weakness, numbness, tingling sensation Psych - Denies any changes in mood irritability or depressive symptoms GU- Denies any burning, dysuria, hematuria or increased frequency of urination Skin - denies any rashes/lesions  Past Medical History:  Diagnosis Date   Anemia    COPD (chronic obstructive pulmonary disease) (HCC)    Dementia (HCC)    Depression    Diabetes mellitus    GERD (gastroesophageal reflux disease)    Hyperlipidemia    Hypertension    Osteoporosis    Schizophrenia, schizo-affective (HCC)    SOB (shortness of breath) on exertion    Syncope 06/11/2019   Past Surgical History:  Procedure Laterality Date   BREAST BIOPSY Right 2017   benign   LAPAROSCOPY  1975   ABD pain   VAGINAL HYSTERECTOMY  1975     Scheduled Meds:  acetaminophen  650 mg Oral Q6H   Or   acetaminophen  650 mg Rectal Q6H   ascorbic acid  250 mg Oral BID   clozapine  200 mg Oral QHS   donepezil  10 mg Oral QHS   enoxaparin (LOVENOX) injection  40 mg Subcutaneous Q24H   insulin aspart  0-9 Units Subcutaneous TID WC   memantine  5 mg Oral BID   mometasone-formoterol  2 puff Inhalation BID   multivitamin with minerals  1 tablet Oral Daily   sodium chloride flush  10 mL Intravenous Q12H   Continuous  Infusions:  piperacillin-tazobactam (ZOSYN)  IV 3.375 g (09/18/23 1308)   vancomycin     PRN Meds:.albuterol  Allergies  Allergen Reactions   Crestor [Rosuvastatin] Other (See Comments)    Muscle aches.    Social History   Socioeconomic History   Marital status: Single    Spouse name: Not on file   Number of children: 0   Years of education: 12   Highest education level: Not on file  Occupational History   Not on file  Tobacco Use   Smoking status: Former    Current packs/day: 0.00    Types: Cigarettes    Start date: 02/27/1997    Quit date: 02/28/2012    Years since quitting: 11.5   Smokeless tobacco: Former    Types: Associate Professor status: Never Used  Substance and Sexual Activity   Alcohol use: Not Currently    Comment: "socially"   Drug use: No   Sexual activity: Not Currently  Other Topics Concern   Not on file  Social History Narrative   Right handed   Drinks caffeine   Retired   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)   Social Determinants of Health   Financial Resource Strain: Low Risk  (03/22/2023)   Overall Financial Resource Strain (CARDIA)    Difficulty of Paying Living Expenses: Not hard at all  Food Insecurity: No Food Insecurity (09/18/2023)   Hunger Vital Sign    Worried About Running Out of Food in the Last Year: Never true    Ran Out of Food in the Last Year: Never true  Transportation Needs: No Transportation Needs (09/18/2023)   PRAPARE - Administrator, Civil Service (Medical): No    Lack of Transportation (Non-Medical): No  Physical Activity: Insufficiently Active (03/22/2023)   Exercise Vital Sign    Days of Exercise per Week: 3 days    Minutes of Exercise per Session: 30 min  Stress: No Stress Concern Present (03/22/2023)   [employer] of Occupational Health - Occupational Stress Questionnaire    Feeling of Stress : Not at all  Social Connections: Socially Isolated (03/22/2023)    Social Connection and Isolation Panel [NHANES]    Frequency of Communication with Friends and Family: Three times a week    Frequency of Social Gatherings with Friends and Family: Never    Attends Religious Services: Never    Database administrator or Organizations: No    Attends Banker Meetings: Never    Marital Status: Never married  Intimate Partner Violence: Not At Risk (09/18/2023)   Humiliation, Afraid, Rape, and Kick questionnaire    Fear of Current or Ex-Partner: No    Emotionally Abused: No    Physically Abused: No    Sexually  Abused: No   Family History  Problem Relation Age of Onset   Breast cancer Neg Hx    Vitals BP (!) 114/49   Pulse 92   Temp 98.2 F (36.8 C) (Oral)   Resp 18   Ht 5\' 5"  (1.651 m)   Wt 74.8 kg   SpO2 95%   BMI 27.46 kg/m   Physical Exam Constitutional:  elderly female sitting in the recliner, NAD    Comments: HEENT wnl   Cardiovascular:     Rate and Rhythm: Normal rate and regular rhythm.     Heart sounds: s1s2  Pulmonary:     Effort: Pulmonary effort is normal.     Comments: Normal breath sounds   Abdominal:     Palpations: Abdomen is soft.     Tenderness: non distended and non tender   Musculoskeletal:        General: No swelling or tenderness. Left thigh is bandaged C/D/I  Skin:    Comments: no rashes, superficial wound at left great toe, no signs of infection   Neurological:     General: awake, follows commands appropriately, poor historian  Psychiatric:        Mood and Affect: Mood normal.    Pertinent Microbiology Results for orders placed or performed during the hospital encounter of 09/17/23  Resp panel by RT-PCR (RSV, Flu A&B, Covid) Anterior Nasal Swab     Status: None   Collection Time: 09/17/23 12:51 PM   Specimen: Anterior Nasal Swab  Result Value Ref Range Status   SARS Coronavirus 2 by RT PCR NEGATIVE NEGATIVE Final   Influenza A by PCR NEGATIVE NEGATIVE Final   Influenza B by PCR NEGATIVE  NEGATIVE Final    Comment: (NOTE) The Xpert Xpress SARS-CoV-2/FLU/RSV plus assay is intended as an aid in the diagnosis of influenza from Nasopharyngeal swab specimens and should not be used as a sole basis for treatment. Nasal washings and aspirates are unacceptable for Xpert Xpress SARS-CoV-2/FLU/RSV testing.  Fact Sheet for Patients: BloggerCourse.com  Fact Sheet for Healthcare Providers: SeriousBroker.it  This test is not yet approved or cleared by the [place] FDA and has been authorized for detection and/or diagnosis of SARS-CoV-2 by FDA under an Emergency Use Authorization (EUA). This EUA will remain in effect (meaning this test can be used) for the duration of the COVID-19 declaration under Section 564(b)(1) of the Act, 21 U.S.C. section 360bbb-3(b)(1), unless the authorization is terminated or revoked.     Resp Syncytial Virus by PCR NEGATIVE NEGATIVE Final    Comment: (NOTE) Fact Sheet for Patients: BloggerCourse.com  Fact Sheet for Healthcare Providers: SeriousBroker.it  This test is not yet approved or cleared by the [place] FDA and has been authorized for detection and/or diagnosis of SARS-CoV-2 by FDA under an Emergency Use Authorization (EUA). This EUA will remain in effect (meaning this test can be used) for the duration of the COVID-19 declaration under Section 564(b)(1) of the Act, 21 U.S.C. section 360bbb-3(b)(1), unless the authorization is terminated or revoked.  Performed at [facility], [address] [place], [place] [address]    Pertinent Lab seen by me:    Latest Ref Rng & Units 09/18/2023    7:44 AM 09/17/2023   12:27 PM 08/15/2023    4:13 AM  CBC  WBC 4.0 - 10.5 K/uL 6.3  10.0  6.2   Hemoglobin 12.0 - 15.0 g/dL 7.2  8.4  15.1   Hematocrit 36.0 - 46.0 % 23.8  28.0  32.3   Platelets 150 - 400 K/uL 470  517  187       Latest  Ref Rng & Units 09/18/2023    7:44 AM 09/17/2023   12:27 PM 08/17/2023    4:37 AM  CMP  Glucose 70 - 99 mg/dL 621  308  657   BUN 8 - 23 mg/dL 13  19  12    Creatinine 0.44 - 1.00 mg/dL 8.46  9.62  9.52   Sodium 135 - 145 mmol/L 140  137  138   Potassium 3.5 - 5.1 mmol/L 3.8  4.0  3.5   Chloride 98 - 111 mmol/L 108  104  104   CO2 22 - 32 mmol/L 21  21  22    Calcium 8.9 - 10.3 mg/dL 8.4  8.5  8.0   Total Protein 6.5 - 8.1 g/dL  6.0  4.8   Total Bilirubin 0.3 - 1.2 mg/dL  0.4  0.3   Alkaline Phos 38 - 126 U/L  49  55   AST 15 - 41 U/L  24  39   ALT 0 - 44 U/L  18  42      Pertinent Imagings/Other Imagings Plain films and CT images have been personally visualized and interpreted; radiology reports have been reviewed. Decision making incorporated into the Impression / Recommendations.  CT FEMUR LEFT W CONTRAST  Result Date: 09/17/2023 CLINICAL DATA:  Left thigh wound EXAM: CT OF THE LOWER LEFT EXTREMITY WITH CONTRAST TECHNIQUE: Multidetector CT imaging of the lower left extremity was performed according to the standard protocol following intravenous contrast administration. RADIATION DOSE REDUCTION: This exam was performed according to the departmental dose-optimization program which includes automated exposure control, adjustment of the mA and/or kV according to patient size and/or use of iterative reconstruction technique. CONTRAST:  75mL OMNIPAQUE IOHEXOL 350 MG/ML SOLN COMPARISON:  X-ray 08/09/2023 FINDINGS: Bones/Joint/Cartilage Near-circumferential proliferative periosteal elevation surrounding the mid left femoral diaphysis at the site of deep soft tissue wound/ulceration. Subtle irregularity of the cortex without well-defined erosion or bony destruction. No fracture. No dislocation. No lytic or sclerotic bone lesion. Small left knee joint effusion is nonspecific. Ligaments Suboptimally assessed by CT. Muscles and Tendons Markedly abnormal appearance of the vastus medialis muscle of the mid  to distal left thigh underlying site of ulceration with heterogeneously low attenuation appearance of the muscle with numerous foci of intramuscular air/gas. There is also irregularity of the sartorius muscle at this location. Soft tissues Large soft tissue defect at the medial aspect of the mid to distal left thigh with absence of the subcutaneous fat. The underlying anterior compartment musculature is exposed at the site of ulceration. There is air tracking to the underlying femur. Surrounding soft tissue edema and ill-defined fluid. No organized or rim enhancing fluid collection. IMPRESSION: 1. Large soft tissue defect at the medial aspect of the mid to distal left thigh with absence of the subcutaneous fat. The underlying anterior compartment musculature is exposed at the site of ulceration. There is air tracking to the underlying femur. Markedly abnormal appearance of the vastus medialis muscle of the mid to distal left thigh underlying site of ulceration with heterogeneously low attenuation appearance of the muscle with numerous foci of intramuscular air/gas. Findings are concerning for necrotizing myositis. 2. Near-circumferential proliferative periosteal elevation surrounding the mid left femoral diaphysis at the site of deep soft tissue wound/ulceration. Subtle irregularity of the cortex without well-defined erosion or bony destruction. Findings are highly suggestive of early acute osteomyelitis. 3. Small  left knee joint effusion is nonspecific. Electronically Signed   By: Duanne Guess D.O.   On: 09/17/2023 15:25   DG Chest Port 1 View  Result Date: 09/17/2023 CLINICAL DATA:  Fall, concern for sepsis EXAM: PORTABLE CHEST 1 VIEW COMPARISON:  Chest x-ray dated August 09, 2023 FINDINGS: Cardiac and mediastinal contours are unchanged. Lungs are clear. No large pleural effusion. No evidence of pneumothorax. Advanced degenerative changes of the left shoulder. IMPRESSION: No active disease.  Electronically Signed   By: Allegra Lai M.D.   On: 09/17/2023 13:52   XR Shoulder Left  Result Date: 09/11/2023 3 views of the left shoulder including Grashey, scapular Y and axial view were ordered and reviewed by myself.  X-rays show advanced arthritic change with notable bony sclerosis and joint space narrowing.  There is callus formation which could be secondary to her glenohumeral joint arthritis versus prior humeral head fracture, although no evidence of acute bony fracture.  Humeral head is well located.   I have personally spent 85 minutes involved in face-to-face and non-face-to-face activities for this patient on the day of the visit. Professional time spent includes the following activities: Preparing to see the patient (review of tests), Obtaining and/or reviewing separately obtained history (admission/discharge record), Performing a medically appropriate examination and/or evaluation , Ordering medications/tests/procedures, referring and communicating with other health care professionals, Documenting clinical information in the EMR, Independently interpreting results (not separately reported), Communicating results to the patient/family/caregiver, Counseling and educating the patient/family/caregiver and Care coordination (not separately reported).  Electronically signed by:   Plan d/w requesting provider as well as ID pharm D  Of note, portions of this note may have been created with voice recognition software. While this note has been edited for accuracy, occasional wrong-word or 'sound-a-like' substitutions may have occurred due to the inherent limitations of voice recognition software.   Odette Fraction, MD Infectious Disease Physician [facility] Pager: [phone]
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
## 2023-09-18 NOTE — Assessment & Plan Note (Signed)
As above.

## 2023-09-18 NOTE — Plan of Care (Signed)
  Problem: Education: Goal: Ability to describe self-care measures that may prevent or decrease complications (Diabetes Survival Skills Education) will improve Outcome: Progressing Goal: Individualized Educational Video(s) Outcome: Progressing   

## 2023-09-18 NOTE — Progress Notes (Addendum)
Initial Nutrition Assessment  DOCUMENTATION CODES:   Not applicable  INTERVENTION:   -Continue regular diet with encouragement.   -Provide protein focused snacks bid in an effort to meet estimated needs to aide in wound healing.   -MVI/Minerals-1 Tab daily, vitamin C 250 mg bid for micronutrient support for wound healing.    NUTRITION DIAGNOSIS:   Increased nutrient needs related to wound healing as evidenced by estimated needs.    GOAL:   Patient will meet greater than or equal to 90% of their needs   MONITOR:   PO intake, Weight trends, Skin, Labs  REASON FOR ASSESSMENT:   Consult Assessment of nutrition requirement/status, Wound healing  ASSESSMENT:77 y/o female presented with left thigh wound after a crush injury last month. Imaging showed evidenced of necrotizing myositis and acute early osteomyelitis.  Recent hospitalization on 9/25-10/4/24  after a stove fell on her leg causing pressure injury of the left thigh.  She was found by neighbors and had rhabdo. Underwent SNF discharge after hospital stay and was recently discharged.  PMH: DM2, HTN, HLD, osteoporosis, COPD, anemia, schizophrenia, dementia.   Per EMR weight hx, no significant weight change identified the past 12 months.  Per patient, endorses a good appetite here and prior to admission, however notes that she did not eat anything today nor yesterday and appears to articulate any reasons other than to state her teeth are not working. Unable to visualize her teeth upon inspection of oral cavity. Per RN, she wears partials upper and lower.  She has only requested black coffee at this time. Family is not in room during visit to confirm nutrition hx. Patient declines all oral supplements at this time.   Medications reviewed and include novolog SS 3x daily with meals.  Labs: CBG 110-115, hgb A1C 6.1%, iron 19, lipid panel from 07/13/23 total cholesterol 211, TG 260, VLDL 45, LDL 112  NUTRITION - FOCUSED PHYSICAL  EXAM:  Flowsheet Row Most Recent Value  Orbital Region No depletion  Upper Arm Region No depletion  Thoracic and Lumbar Region No depletion  Buccal Region No depletion  Temple Region No depletion  Clavicle Bone Region No depletion  Clavicle and Acromion Bone Region No depletion  Scapular Bone Region No depletion  Dorsal Hand No depletion  Patellar Region No depletion  Anterior Thigh Region No depletion  [patient expresses pain on right side with palpation of muscles in thighs and calves, per visual, appears adequate muscle stores. Adequate muscle stores on left side.]  Posterior Calf Region No depletion  [patient expresses pain on right side with palpation of muscles in thighs and calves, per visual, appears adequate muscle stores. Adequate muscle stores on left side.]  Edema (RD Assessment) Mild  [edema 1+ on left lower extremity]  Hair Reviewed  Eyes Reviewed  Mouth Reviewed  Skin Reviewed  Nails Reviewed       Diet Order:   Diet Order             Diet regular Room service appropriate? Yes; Fluid consistency: Thin  Diet effective now                   EDUCATION NEEDS:   Not appropriate for education at this time  Skin:  Skin Assessment: Skin Integrity Issues: Skin Integrity Issues:: Other (Comment) Other: non-pressure wound to left anterior thigh, blanching/redness to right proximal hip, abrasion to left great toe  Last BM:  prior to admission  Height:   Ht Readings from Last 1 Encounters:  09/17/23 5\' 5"  (1.651 m)    Weight:   Wt Readings from Last 1 Encounters:  09/17/23 74.8 kg    Ideal Body Weight:  59.1 kg  BMI:  Body mass index is 27.46 kg/m.  Estimated Nutritional Needs:   Kcal:  1500-1800 kcal/day  Protein:  77-89 gm/day  Fluid:  1500-1800 mL/day    Alvino Chapel, RDLD Clinical Dietitian See AMION for contact information

## 2023-09-19 ENCOUNTER — Encounter (HOSPITAL_COMMUNITY)
Admission: EM | Disposition: A | Discharge status: Skilled Nursing Facility | Payer: Self-pay | Source: Home / Self Care | Attending: Family Medicine

## 2023-09-19 ENCOUNTER — Encounter (HOSPITAL_COMMUNITY): Payer: Self-pay | Admitting: Student

## 2023-09-19 DIAGNOSIS — Z79899 Other long term (current) drug therapy: Secondary | ICD-10-CM | POA: Diagnosis not present

## 2023-09-19 DIAGNOSIS — S81802D Unspecified open wound, left lower leg, subsequent encounter: Secondary | ICD-10-CM

## 2023-09-19 DIAGNOSIS — L89224 Pressure ulcer of left hip, stage 4: Secondary | ICD-10-CM | POA: Diagnosis not present

## 2023-09-19 DIAGNOSIS — M868X5 Other osteomyelitis, thigh: Secondary | ICD-10-CM | POA: Diagnosis not present

## 2023-09-19 DIAGNOSIS — M60052 Infective myositis, left thigh: Secondary | ICD-10-CM | POA: Diagnosis not present

## 2023-09-19 LAB — CBC
HCT: 27.4 % — ABNORMAL LOW (ref 36.0–46.0)
Hemoglobin: 8.3 g/dL — ABNORMAL LOW (ref 12.0–15.0)
MCH: 26.6 pg (ref 26.0–34.0)
MCHC: 30.3 g/dL (ref 30.0–36.0)
MCV: 87.8 fL (ref 80.0–100.0)
Platelets: 518 10*3/uL — ABNORMAL HIGH (ref 150–400)
RBC: 3.12 MIL/uL — ABNORMAL LOW (ref 3.87–5.11)
RDW: 15.6 % — ABNORMAL HIGH (ref 11.5–15.5)
WBC: 6 10*3/uL (ref 4.0–10.5)
nRBC: 0 % (ref 0.0–0.2)

## 2023-09-19 LAB — MRSA NEXT GEN BY PCR, NASAL: MRSA by PCR Next Gen: NOT DETECTED

## 2023-09-19 LAB — BASIC METABOLIC PANEL
Anion gap: 10 (ref 5–15)
BUN: 11 mg/dL (ref 8–23)
CO2: 22 mmol/L (ref 22–32)
Calcium: 8.6 mg/dL — ABNORMAL LOW (ref 8.9–10.3)
Chloride: 111 mmol/L (ref 98–111)
Creatinine, Ser: 1.07 mg/dL — ABNORMAL HIGH (ref 0.44–1.00)
GFR, Estimated: 53 mL/min — ABNORMAL LOW (ref >60)
Glucose, Bld: 106 mg/dL — ABNORMAL HIGH (ref 70–99)
Potassium: 3.7 mmol/L (ref 3.5–5.1)
Sodium: 143 mmol/L (ref 135–145)

## 2023-09-19 LAB — GLUCOSE, CAPILLARY
Glucose-Capillary: 124 mg/dL — ABNORMAL HIGH (ref 70–99)
Glucose-Capillary: 111 mg/dL — ABNORMAL HIGH (ref 70–99)
Glucose-Capillary: 101 mg/dL — ABNORMAL HIGH (ref 70–99)
Glucose-Capillary: 90 mg/dL (ref 70–99)
Glucose-Capillary: 147 mg/dL — ABNORMAL HIGH (ref 70–99)

## 2023-09-19 SURGERY — IRRIGATION AND DEBRIDEMENT WOUND
Anesthesia: Choice | Laterality: Left

## 2023-09-19 MED ORDER — ONDANSETRON HCL 4 MG/2ML IJ SOLN
INTRAMUSCULAR | Status: AC
  Filled 2023-09-19: qty 2

## 2023-09-19 MED ORDER — CHLORHEXIDINE GLUCONATE 0.12 % MT SOLN: 15.0000 mL | Freq: Once | OROMUCOSAL | Status: AC

## 2023-09-19 MED ORDER — CHLORHEXIDINE GLUCONATE CLOTH 2 % EX PADS
6.0000 | MEDICATED_PAD | Freq: Once | CUTANEOUS | Status: DC
Start: 2023-09-19 — End: 2023-09-19

## 2023-09-19 MED ORDER — ORAL CARE MOUTH RINSE: 15.0000 mL | Freq: Once | OROMUCOSAL | Status: AC

## 2023-09-19 MED ORDER — LACTATED RINGERS IV SOLN: INTRAVENOUS | Status: DC

## 2023-09-19 MED ORDER — CHLORHEXIDINE GLUCONATE 0.12 % MT SOLN
OROMUCOSAL | Status: AC
  Administered 2023-09-19: 15 mL via OROMUCOSAL
  Filled 2023-09-19: qty 15

## 2023-09-19 MED ORDER — CHLORHEXIDINE GLUCONATE CLOTH 2 % EX PADS: 6.0000 | MEDICATED_PAD | Freq: Once | CUTANEOUS | Status: DC

## 2023-09-19 MED ORDER — SODIUM CHLORIDE 0.9 % IV SOLN: INTRAVENOUS | Status: DC

## 2023-09-19 MED ORDER — DEXAMETHASONE SODIUM PHOSPHATE 10 MG/ML IJ SOLN
INTRAMUSCULAR | Status: AC
  Filled 2023-09-19: qty 1

## 2023-09-19 MED ORDER — LIDOCAINE 2% (20 MG/ML) 5 ML SYRINGE
INTRAMUSCULAR | Status: AC
  Filled 2023-09-19: qty 5

## 2023-09-19 MED ORDER — PROPOFOL 10 MG/ML IV BOLUS
INTRAVENOUS | Status: AC
  Filled 2023-09-19: qty 20

## 2023-09-19 NOTE — Evaluation (Signed)
Occupational Therapy Evaluation Patient Details Name: Elizabeth Schwartz MRN: [id number] DOB: [birth year] Today's Date: 09/19/2023   History of Present Illness Pt is a 77 y/o F admitted on 09/17/23 after being advised by [facility] nurse to go to the ED after assessing pt's wound. CT showed necrotic myositis, pt also found to have acute osteomyelitis. PMH: anemia, COPD, dementia, depression, DM, HLD, HTN, osteoporosis, schizo-affective, SOB, syncope   Clinical Impression   PTA patient independent with ADLs, IADLS, mobility and driving.  Admitted for above and presents with problem list below.  Questionable historian, as per chart review pt home from SNF recently and pt reports being home for several months; noted hx of dementia but living on her own.  She currently requires setup to mod assist for ADLs, min assist for transfers using RW but deferred further mobility as RN arrives reporting plan for procedure this am.  Based on performance today, believe patient will best benefit from continued OT services acutely and after dc at inpatient setting with <3hrs/day to optimize independence, safety with ADLs and mobility.      If plan is discharge home, recommend the following: A little help with walking and/or transfers;A lot of help with bathing/dressing/bathroom;Assistance with cooking/housework;Direct supervision/assist for financial management;Direct supervision/assist for medications management;Assist for transportation;Help with stairs or ramp for entrance    Functional Status Assessment  Patient has had a recent decline in their functional status and demonstrates the ability to make significant improvements in function in a reasonable and predictable amount of time.  Equipment Recommendations  Other (comment) (defer)    Recommendations for Other Services       Precautions / Restrictions Precautions Precautions: Fall Restrictions Weight Bearing Restrictions: No      Mobility Bed  Mobility Overal bed mobility: Needs Assistance Bed Mobility: Supine to Sit, Sit to Supine     Supine to sit: Supervision, HOB elevated Sit to supine: Min assist   General bed mobility comments: increased time and cueing but no physical assist to ascend to EOB, min assist to fully bring L LE back to supine    Transfers Overall transfer level: Needs assistance Equipment used: Rolling walker (2 wheels) Transfers: Sit to/from Stand Sit to Stand: Min assist           General transfer comment: RW to power up and steady, pt preference to place hands on RW and pull up.      Balance Overall balance assessment: Needs assistance Sitting-balance support: Feet supported Sitting balance-Leahy Scale: Fair Sitting balance - Comments: supervision static sitting   Standing balance support: Bilateral upper extremity supported, During functional activity Standing balance-Leahy Scale: Poor Standing balance comment: relies on BUE and external support                           ADL either performed or assessed with clinical judgement   ADL Overall ADL's : Needs assistance/impaired     Grooming: Set up;Sitting           Upper Body Dressing : Set up;Sitting   Lower Body Dressing: Moderate assistance;Sit to/from stand Lower Body Dressing Details (indicate cue type and reason): requires assist with L side, min assist to stand but relies on B UE support   Toilet Transfer Details (indicate cue type and reason): deferred as surgery planned this am         Functional mobility during ADLs: Minimal assistance;Rolling walker (2 wheels)       Vision  Vision Assessment?: No apparent visual deficits     Perception         Praxis         Pertinent Vitals/Pain Pain Assessment Pain Assessment: Faces Faces Pain Scale: Hurts a little bit Pain Location: LLE with movement Pain Descriptors / Indicators: Grimacing, Guarding Pain Intervention(s): Limited activity within  patient's tolerance, Monitored during session, Repositioned     Extremity/Trunk Assessment Upper Extremity Assessment Upper Extremity Assessment: Generalized weakness   Lower Extremity Assessment Lower Extremity Assessment: Defer to PT evaluation       Communication Communication Communication: No apparent difficulties   Cognition Arousal: Alert Behavior During Therapy: WFL for tasks assessed/performed Overall Cognitive Status: History of cognitive impairments - at baseline                                 General Comments: pt oriented and following simple commands with increased time.  Noted decreased initation and probelm solving, but cognition not formally tested.  Some decreased awareness as reports she was "wet with urine" but she wasn't. Some questionable history, as pt reports she was home for 3 months from SNF.     General Comments       Exercises     Shoulder Instructions      Home Living Family/patient expects to be discharged to:: Private residence Living Arrangements: Alone   Type of Home: Apartment Home Access: Level entry     Home Layout: One level     Bathroom Shower/Tub: Chief Strategy Officer: Handicapped height     Home Equipment: Rollator (4 wheels)          Prior Functioning/Environment Prior Level of Function : Independent/Modified Independent;Driving;Patient poor historian/Family not available             Mobility Comments: Reports she's ambulatory without AD, denies falls in the past 6 months despite chart noting pt had fall leading to past admission. ADLs Comments: Reports she cooks & cleans. drives        OT Problem List: Decreased strength;Decreased activity tolerance;Impaired balance (sitting and/or standing);Pain;Increased edema;Impaired UE functional use;Decreased knowledge of precautions;Decreased knowledge of use of DME or AE;Decreased safety awareness;Decreased cognition      OT  Treatment/Interventions: Self-care/ADL training;Therapeutic exercise;DME and/or AE instruction;Therapeutic activities;Balance training;Patient/family education    OT Goals(Current goals can be found in the care plan section) Acute Rehab OT Goals Patient Stated Goal: home OT Goal Formulation: With patient Time For Goal Achievement: 10/03/23 Potential to Achieve Goals: Fair  OT Frequency: Min 1X/week    Co-evaluation              AM-PAC OT "6 Clicks" Daily Activity     Outcome Measure Help from another person eating meals?: Total (NPO) Help from another person taking care of personal grooming?: A Little Help from another person toileting, which includes using toliet, bedpan, or urinal?: A Lot Help from another person bathing (including washing, rinsing, drying)?: A Lot Help from another person to put on and taking off regular upper body clothing?: A Little Help from another person to put on and taking off regular lower body clothing?: A Lot 6 Click Score: 13   End of Session Equipment Utilized During Treatment: Rolling walker (2 wheels) Nurse Communication: Mobility status  Activity Tolerance: Patient tolerated treatment well Patient left: in bed;with call bell/phone within reach;with bed alarm set;with nursing/sitter in room  OT Visit Diagnosis: Other abnormalities of  gait and mobility (R26.89);Muscle weakness (generalized) (M62.81);Pain;Other symptoms and signs involving cognitive function Pain - Right/Left: Left Pain - part of body: Leg                Time: 1610-9604 OT Time Calculation (min): 15 min Charges:  OT General Charges $OT Visit: 1 Visit OT Evaluation $OT Eval Moderate Complexity: 1 Mod  Barry Brunner, OT Acute Rehabilitation Services Office [phone]   Chancy Milroy 09/19/2023, 10:57 AM

## 2023-09-19 NOTE — Anesthesia Preprocedure Evaluation (Addendum)
Anesthesia Evaluation  Patient identified by MRN, date of birth, ID band Patient awake    Reviewed: Allergy & Precautions, NPO status , Patient's Chart, lab work & pertinent test results  Airway Mallampati: II  TM Distance: >3 FB Neck ROM: Full    Dental  (+) Teeth Intact, Dental Advisory Given   Pulmonary asthma , COPD,  COPD inhaler, former smoker   breath sounds clear to auscultation       Cardiovascular hypertension,  Rhythm:Regular Rate:Normal  07/2023 ECHO: EF 65 to 70%.  1. The LV has normal function, no regional wall motion abnormalities. There is mild concentric LVH.    2. RVF is normal. The right ventricular size is normal. There is normal pulmonary artery systolic pressure.   3. The mitral valve is normal in structure. No evidence of MR. No evidence of mitral stenosis.   4. The aortic valve is tricuspid. Aortic valve regurgitation is not visualized. No aortic stenosis is present.     Neuro/Psych  PSYCHIATRIC DISORDERS  Depression  Schizophrenia Dementia    GI/Hepatic ,GERD  Medicated,,  Endo/Other  diabetes, Type 2, Oral Hypoglycemic Agents  Glu 90  Renal/GU Renal InsufficiencyRenal disease     Musculoskeletal   Abdominal   Peds  Hematology  (+) Blood dyscrasia, anemia Hb 8.3, plt 518   Anesthesia Other Findings   Reproductive/Obstetrics                             Anesthesia Physical Anesthesia Plan  ASA: 3  Anesthesia Plan: General   Post-op Pain Management: Tylenol PO (pre-op)*   Induction: Intravenous  PONV Risk Score and Plan: 3 and Ondansetron, Dexamethasone and Treatment may vary due to age or medical condition  Airway Management Planned: LMA  Additional Equipment: None  Intra-op Plan:   Post-operative Plan: Extubation in OR  Informed Consent: I have reviewed the patients History and Physical, chart, labs and discussed the procedure including the risks,  benefits and alternatives for the proposed anesthesia with the patient or authorized representative who has indicated his/her understanding and acceptance.     Dental advisory given  Plan Discussed with: CRNA  Anesthesia Plan Comments:        Anesthesia Quick Evaluation

## 2023-09-19 NOTE — Progress Notes (Signed)
Elizabeth Schwartz, [facility] resident service coordinator called up here in the unit and updated about patient's condition.

## 2023-09-19 NOTE — Progress Notes (Signed)
Multiple attempts to contact Baxter Kail, regarding the pt's surgery and cognition with no answer. Spoke with Talmadge Coventry from the pt's apartment complex, and she states the pt's does have a history of dementia and it has been getting worse since September of this year. French Ana states that even though Bonita Quin is listed as her emergency contact, she is really not apart of this pt's care. She has tried as well to contact her with no answer. French Ana is concerned that if the pt goes home alone again, something will happen to her.

## 2023-09-19 NOTE — Plan of Care (Signed)
  Problem: Education: Goal: Ability to describe self-care measures that may prevent or decrease complications (Diabetes Survival Skills Education) will improve Outcome: Progressing Goal: Individualized Educational Video(s) Outcome: Progressing   

## 2023-09-19 NOTE — [place] FL2 (Signed)
[place] MEDICAID FL2 LEVEL OF CARE FORM     IDENTIFICATION  Patient Name: Elizabeth Schwartz Birthdate: May 05, 1946 Sex: female Admission Date (Current Location): 09/17/2023  [facility] and [place] Number:  Producer, television/film/video and Address:  [facility] [place], [address] [place], [place] [address]      Provider Number: [id number]  Attending Physician Name and Address:  McDiarmid, Leighton Roach, MD  Relative Name and Phone Number:  Gurka,[name] Niece   [phone]    Current Level of Care: Hospital Recommended Level of Care: Skilled Nursing Facility Prior Approval Number:    Date Approved/Denied:   PASRR Number:    Discharge Plan: SNF    Current Diagnoses: Patient Active Problem List   Diagnosis Date Noted   Long term current use of clozapine 09/18/2023   Myositis of left thigh 09/18/2023   Osteomyelitis of left femur (HCC) 09/18/2023   Pressure injury of left thigh, stage 4 (HCC) 09/18/2023   Pressure injury of deep tissue of left thigh 09/17/2023   Rhabdomyolysis 08/10/2023   AKI (acute kidney injury) (HCC) 08/10/2023   Transaminitis 08/10/2023   CAP (community acquired pneumonia) 08/10/2023   Lactic acidosis 08/10/2023   Elevated troponin 08/10/2023   Pressure injury of skin with suspected deep tissue injury 08/10/2023   Porokeratosis 01/02/2023   Dementia due to Alzheimer's disease (HCC) 08/11/2022   Statin myopathy 04/21/2022   Pain due to onychomycosis of toenails of both feet 02/22/2022   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) 11/22/2021   Chronic pain of right hip 09/15/2021   Chronic pain of left knee 09/15/2021   Chronic left shoulder pain 05/24/2021   Mixed hyperlipidemia 03/22/2021   Pincer nail deformity 11/03/2020   Diabetes type 2, controlled (HCC) 06/11/2019   Schizophrenia (HCC) 03/20/2019   Chronic diarrhea 03/20/2019   Hypertension 08/22/2016   Anemia 08/22/2016   Major neurocognitive disorder due to multiple  etiologies with behavioral disturbance (HCC) 08/22/2016   Osteoporosis 08/22/2016   Asthma, moderate persistent 09/08/2011   Diverticulosis 09/08/2011   Former smoker 09/08/2011   GERD 06/04/2007    Orientation RESPIRATION BLADDER Height & Weight     Self, Time, Situation, Place  Normal Incontinent Weight: 165 lb (74.8 kg) Height:  5\' 5"  (165.1 cm)  BEHAVIORAL SYMPTOMS/MOOD NEUROLOGICAL BOWEL NUTRITION STATUS      Incontinent Diet (see discharge summary)  AMBULATORY STATUS COMMUNICATION OF NEEDS Skin   Limited Assist Verbally Other (Comment) (wound on left thigh)                       Personal Care Assistance Level of Assistance  Bathing, Feeding, Dressing Bathing Assistance: Limited assistance Feeding assistance: Limited assistance Dressing Assistance: Limited assistance     Functional Limitations Info  Sight, Hearing, Speech Sight Info: Adequate Hearing Info: Adequate Speech Info: Adequate    SPECIAL CARE FACTORS FREQUENCY  PT (By licensed PT), OT (By licensed OT)     PT Frequency: 5x week OT Frequency: 5x week            Contractures Contractures Info: Not present    Additional Factors Info  Code Status, Allergies, Insulin Sliding Scale Code Status Info: full Allergies Info: crestor   Insulin Sliding Scale Info: Novolog: see discharge summary       Current Medications (09/19/2023):  This is the current hospital active medication list Current Facility-Administered Medications  Medication Dose Route Frequency Provider Last Rate Last Admin   acetaminophen (  TYLENOL) tablet 650 mg  650 mg Oral Q6H Erick Alley, DO   650 mg at 09/19/23 0500   Or   acetaminophen (TYLENOL) suppository 650 mg  650 mg Rectal Q6H Erick Alley, DO       albuterol (PROVENTIL) (2.5 MG/3ML) 0.083% nebulizer solution 2.5 mg  2.5 mg Inhalation Q6H PRN Erick Alley, DO       ascorbic acid (VITAMIN C) tablet 250 mg  250 mg Oral BID McDiarmid, Leighton Roach, MD   250 mg at 09/18/23 2021    cloZAPine (CLOZARIL) tablet 200 mg  200 mg Oral QHS Erick Alley, DO   200 mg at 09/18/23 2022   donepezil (ARICEPT) tablet 10 mg  10 mg Oral QHS Erick Alley, DO   10 mg at 09/18/23 2022   insulin aspart (novoLOG) injection 0-9 Units  0-9 Units Subcutaneous TID WC Erick Alley, DO   1 Units at 09/18/23 1717   linezolid (ZYVOX) tablet 600 mg  600 mg Oral Q12H Odette Fraction, MD   600 mg at 09/18/23 2022   memantine (NAMENDA) tablet 5 mg  5 mg Oral BID Erick Alley, DO   5 mg at 09/18/23 2022   mometasone-formoterol (DULERA) 200-5 MCG/ACT inhaler 2 puff  2 puff Inhalation BID Erick Alley, DO   2 puff at 09/19/23 9604   multivitamin with minerals tablet 1 tablet  1 tablet Oral Daily McDiarmid, Leighton Roach, MD   1 tablet at 09/18/23 1322   piperacillin-tazobactam (ZOSYN) IVPB 3.375 g  3.375 g Intravenous Q8H [name], RPH 12.5 mL/hr at 09/19/23 0503 3.375 g at 09/19/23 0503   sodium chloride flush (NS) 0.9 % injection 10 mL  10 mL Intravenous Q12H Anders Simmonds T, DO   10 mL at 09/18/23 2114     Discharge Medications: Please see discharge summary for a list of discharge medications.  Relevant Imaging Results:  Relevant Lab Results:   Additional Information SSN: [id number]  Dossie Der, Einar Crow, [place]

## 2023-09-19 NOTE — Progress Notes (Signed)
Daily Progress Note Intern Pager: [phone]  Patient name: Elizabeth Schwartz Medical record number: [id number] Date of birth: 03/13/1946 Age: 77 y.o. Gender: female  Primary Care Provider: Storm Frisk, MD Consultants: Ortho, plastic surgery, ID Code Status: full   Pt Overview and Major Events to Date:  11/03: admission   Assessment and Plan:   Elizabeth Schwartz is a 77 y.o. female presenting with left thigh wound after crush injury last month.  Imaging shows evidence of necrotizing myositis and acute early osteomyelitis. Vs stable, no leukocytosis or sepsis. 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  Ortho has been consulted and seen patient.  They plan for surgical debridement in next 2-3 days, plastic surgery (Ortho has consulted), wound care, and will likely need wound VAC.  They recommend wet-to-dry dry dressings prior to surgery.   -Ortho and Plastic surgery consulted: plan for OR today, NPO at 5 AM this morning, holding lovenox  - post op check today, likely in evening -Wound care says no further topical care at this time other than wet to dry dressings for now  -ID consult: changed vanc to linezolid 600 mg BID, left foot XR negative, monitor CBC, CMP and signs of sepsis  -IV zosyn per pharmacy  -Scheduled Tylenol for pain - f/u blood cx (no growth x 1 day) -TOC consult  -PT/OT -nutrition consult: MVI, vitamin C 250 mg bid  Other acute osteomyelitis of left femur (HCC) As above Type 2 diabetes mellitus without complication, unspecified whether long term insulin use (HCC) A1c 8/29 of 6.1.  Home medications include metformin 500 mg twice daily and she received SSI at SNF. Glucose 111 -Discontinue CGM due to stable glucose not requiring insulin -Hold metformin Dementia, unspecified dementia severity, unspecified dementia type, unspecified whether behavioral, psychotic, or mood  disturbance or anxiety (HCC) On chart review, it is Alzheimer's dementia.  She is unable to provide clear history.  Unable to reach [place] (patient's in chart decision maker) via phone. Home medications listed include memantine 5 mg twice daily and Aricept 10 mg at bedtime.  -Delirium precautions -Fall precautions -Will continue to try to reach family members to confirm medications and code status -Memantine and Aricept continue Anemia, unspecified type Normocytic. Anemia panel done 05/31/2022 which was normal but unclear if she was taking iron at this time. Iron panel shows low TIBC not consistent with IDA, likely anemia of chronic disease such as from COPD. Reassuringly no signs of acute bleeding. Hb increased to 8.3.   Chronic and Stable Problems:  HLD-Zetia 10 mg daily, fenofibrate 160 mg daily, not yet started, will confirm Schizophrenia-medication includes clozapine 200 mg at bedtime -ordered COPD-Symbicort 2 puffs twice daily, albuterol as needed -Dulera and albuterol ordered Osteoporosis-alendronate 70 mg once a week on Wednesdays per chart review.  Not ordered   FEN/GI: Regular diet VTE Prophylaxis: Lovenox held prior to OR Dispo: SNF, pending clinical improvement  Subjective:  Pt denies any complaints and reports pain is ok.  Objective: Temp:  [97.6 F (36.4 C)-99.2 F (37.3 C)] 97.6 F (36.4 C) (11/05 0731) Pulse Rate:  [87-92] 87 (11/05 0731) Resp:  [16-19] 16 (11/05 0731) BP: (114-134)/(41-63) 129/52 (11/05 0731) SpO2:  [94 %-99 %] 98 % (11/05 0731) Physical Exam: General: lying comfortably in bed, NAD Cardiovascular: RRR, no murmur Respiratory: CTA Abdomen: bowel sounds present, no TTP Extremities: left thigh with bandage, left foot 2+ pitting edema with mild erythema  Laboratory: Most recent CBC Lab Results  Component Value Date   WBC 6.0 09/19/2023   HGB 8.3 (L) 09/19/2023   HCT 27.4 (L) 09/19/2023   MCV 87.8 09/19/2023   PLT 518 (H) 09/19/2023   Most  recent BMP    Latest Ref Rng & Units 09/19/2023    6:40 AM  BMP  Glucose 70 - 99 mg/dL 093   BUN 8 - 23 mg/dL 11   Creatinine 2.35 - 1.00 mg/dL 5.73   Sodium 220 - 254 mmol/L 143   Potassium 3.5 - 5.1 mmol/L 3.7   Chloride 98 - 111 mmol/L 111   CO2 22 - 32 mmol/L 22   Calcium 8.9 - 10.3 mg/dL 8.6     Imaging/Diagnostic Tests: Left foot XR IMPRESSION: 1. Soft tissue swelling. 2. No acute osseous findings. 3. Hallux valgus deformity.  Elizabeth Schwartz, Medical Student 09/19/2023, 8:05 AM  [facility] FPTS Intern pager: [phone], text pages welcome Secure chat group [facility] Teaching Service

## 2023-09-19 NOTE — Consult Note (Signed)
[facility] Liaison Consult Note   Primary Care Provider: Storm Frisk, MD with [facility] and Wellness, this provider is listed for the transition of care follow up appointments  and calls   [facility] Liaison on unit rounds to follow up with inpatient [facility] team regarding referral for VBCI social worker assistance however the CMA has been unable to reach patient. Patient is currently post procedure and resting.    The patient was screened for 30 day readmission hospitalization with noted medium risk score for unplanned readmission risk 2 hospital admissions in 6 months.  The patient was assessed for potential follow up [facility] Coordination service needs for post hospital transition for care coordination. Review of patient's electronic medical record reveals patient is has been at [facility] for care per notes from [facility] member.   Plan: Spoke with inpatient TOC LCSW regarding community issues and possible needs. RN Hospital Liaison will continue to follow progress and disposition to asess for post hospital community care coordination/management needs.  Referral request for community care: coordination:disposition and post hospital  undetermined at this time   [facility] Management/Population Health does not replace or interfere with any arrangements made by the Inpatient Transition of Care team.   For questions contact:   Charlesetta Shanks, RN, BSN, CCM   [facility], [facility] Liaison Direct Dial: [phone] or secure chat Email: [email or web address]

## 2023-09-19 NOTE — Progress Notes (Signed)
I was consulted to assist with management of a soft tissue wound on the medial aspect of Elizabeth Schwartz a left thigh.  She was scheduled for surgery this evening for exam under anesthesia with debridement of her wound.  I met Elizabeth Schwartz in the preoperative area to discussed the procedure with her.  I became concerned with her ability to legally sign her own consent form when it was clear that she was not entirely sure where she was, how long she has been here, or how long she has had the wound on her leg.  Elizabeth Schwartz, the registered nurse caring for Elizabeth Schwartz in the preoperative area, attempted to contact Elizabeth Schwartz listed next of kin, Elizabeth Schwartz, but was unable to reach her by either phone or text.  She called the apartment complex where Elizabeth Schwartz resides.  She spoke with the apartment manager who stated that nobody has come to see Elizabeth Schwartz in September and that she has been very concerned about a recent decline in Elizabeth Schwartz mental and physical health.  I have canceled Elizabeth Schwartz surgery until we can address the issues of her ability to competently sign her own consent forms given my concerns speaking with her and the extensive documentation of her Alzheimer's disease with dementia.  Elizabeth Schwartz should have regular dressing changes, at least twice a day, with saline dampened gauze until she undergoes debridement.

## 2023-09-20 DIAGNOSIS — L89224 Pressure ulcer of left hip, stage 4: Secondary | ICD-10-CM | POA: Diagnosis not present

## 2023-09-20 DIAGNOSIS — M868X5 Other osteomyelitis, thigh: Secondary | ICD-10-CM | POA: Diagnosis not present

## 2023-09-20 DIAGNOSIS — M60052 Infective myositis, left thigh: Secondary | ICD-10-CM | POA: Diagnosis not present

## 2023-09-20 DIAGNOSIS — F209 Schizophrenia, unspecified: Secondary | ICD-10-CM | POA: Diagnosis not present

## 2023-09-20 LAB — COMPREHENSIVE METABOLIC PANEL
ALT: 15 U/L (ref 0–44)
AST: 17 U/L (ref 15–41)
Albumin: 2.5 g/dL — ABNORMAL LOW (ref 3.5–5.0)
Alkaline Phosphatase: 39 U/L (ref 38–126)
Anion gap: 8 (ref 5–15)
BUN: 10 mg/dL (ref 8–23)
CO2: 22 mmol/L (ref 22–32)
Calcium: 8.3 mg/dL — ABNORMAL LOW (ref 8.9–10.3)
Chloride: 112 mmol/L — ABNORMAL HIGH (ref 98–111)
Creatinine, Ser: 0.93 mg/dL (ref 0.44–1.00)
GFR, Estimated: >60 mL/min (ref >60)
Glucose, Bld: 91 mg/dL (ref 70–99)
Potassium: 3.5 mmol/L (ref 3.5–5.1)
Sodium: 142 mmol/L (ref 135–145)
Total Bilirubin: 0.4 mg/dL (ref <1.2)
Total Protein: 5.4 g/dL — ABNORMAL LOW (ref 6.5–8.1)

## 2023-09-20 LAB — CBC
HCT: 25.6 % — ABNORMAL LOW (ref 36.0–46.0)
Hemoglobin: 7.7 g/dL — ABNORMAL LOW (ref 12.0–15.0)
MCH: 27.1 pg (ref 26.0–34.0)
MCHC: 30.1 g/dL (ref 30.0–36.0)
MCV: 90.1 fL (ref 80.0–100.0)
Platelets: 506 10*3/uL — ABNORMAL HIGH (ref 150–400)
RBC: 2.84 MIL/uL — ABNORMAL LOW (ref 3.87–5.11)
RDW: 15.8 % — ABNORMAL HIGH (ref 11.5–15.5)
WBC: 5.9 10*3/uL (ref 4.0–10.5)
nRBC: 0 % (ref 0.0–0.2)

## 2023-09-20 LAB — GLUCOSE, CAPILLARY
Glucose-Capillary: 93 mg/dL (ref 70–99)
Glucose-Capillary: 94 mg/dL (ref 70–99)
Glucose-Capillary: 196 mg/dL — ABNORMAL HIGH (ref 70–99)
Glucose-Capillary: 118 mg/dL — ABNORMAL HIGH (ref 70–99)

## 2023-09-20 MED ORDER — ENOXAPARIN SODIUM 40 MG/0.4ML IJ SOSY: 40.0000 mg | PREFILLED_SYRINGE | INTRAMUSCULAR | Status: DC

## 2023-09-20 MED ORDER — ENOXAPARIN SODIUM 40 MG/0.4ML IJ SOSY
40.0000 mg | PREFILLED_SYRINGE | INTRAMUSCULAR | Status: DC
  Administered 2023-09-20 – 2023-09-23 (×4): 40 mg via SUBCUTANEOUS
  Filled 2023-09-20 (×4): qty 0.4

## 2023-09-20 MED ORDER — INSULIN ASPART 100 UNIT/ML IJ SOLN
0.0000 [IU] | Freq: Three times a day (TID) | INTRAMUSCULAR | Status: DC
  Administered 2023-09-20: 2 [IU] via SUBCUTANEOUS
  Administered 2023-09-22: 3 [IU] via SUBCUTANEOUS
  Administered 2023-09-22 – 2023-09-23 (×2): 1 [IU] via SUBCUTANEOUS
  Administered 2023-09-24: 2 [IU] via SUBCUTANEOUS
  Administered 2023-09-28: 1 [IU] via SUBCUTANEOUS
  Administered 2023-09-28: 2 [IU] via SUBCUTANEOUS
  Administered 2023-09-29: 1 [IU] via SUBCUTANEOUS

## 2023-09-20 NOTE — Plan of Care (Signed)
  Problem: Education: Goal: Ability to describe self-care measures that may prevent or decrease complications (Diabetes Survival Skills Education) will improve Outcome: Progressing Goal: Individualized Educational Video(s) Outcome: Progressing   

## 2023-09-20 NOTE — Progress Notes (Cosign Needed)
RE:  Elizabeth Schwartz       Date of Birth: [id number]      Date:   09/20/23       To Whom It May Concern:  Please be advised that the above-named patient will require a short-term nursing home stay - anticipated 30 days or less for rehabilitation and strengthening.  The plan is for return home.                 MD signature                Date
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
## 2023-09-20 NOTE — Care Management Important Message (Signed)
Important Message  Patient Details  Name: Elizabeth Schwartz MRN: [id number] Date of Birth: 1946-01-24   Important Message Given:  Yes - Medicare IM     Dorena Bodo 09/20/2023, 3:32 PM

## 2023-09-20 NOTE — Progress Notes (Signed)
Physical Therapy Treatment Patient Details Name: Elizabeth Schwartz MRN: [id number] DOB: [birth year] Today's Date: 09/20/2023   History of Present Illness Pt is a 77 y/o F admitted on 09/17/23 after being advised by [facility] nurse to go to the ED after assessing pt's wound. CT showed necrotic myositis, pt also found to have acute osteomyelitis. PMH: anemia, COPD, dementia, depression, DM, HLD, HTN, osteoporosis, schizo-affective, SOB, syncope    PT Comments  Pt received in supine, agreeable to therapy session with encouragement, pt oriented to location and self. Pt noted to have LLE foot drop, not reported in previous session and states it has been like this "for a couple weeks", RN notified. Pt needing up to modA for sit<>stand and minA for step pivot transfer from bed to chair with c/o dizziness while standing, pt BP stable in recliner once BP machine located which took a few minutes. Plan to assess orthostatic BP next session and work on gait progression with chair follow for safety if VSS. Pt continues to benefit from PT services to progress toward functional mobility goals.     If plan is discharge home, recommend the following: A little help with walking and/or transfers;A little help with bathing/dressing/bathroom;Assistance with cooking/housework;Supervision due to cognitive status;Direct supervision/assist for financial management;Assist for transportation;Help with stairs or ramp for entrance;Direct supervision/assist for medications management   Can travel by private vehicle     Yes  Equipment Recommendations  Rolling walker (2 wheels);BSC/3in1    Recommendations for Other Services       Precautions / Restrictions Precautions Precautions: Fall Precaution Comments: LLE foot drop Restrictions Weight Bearing Restrictions: No     Mobility  Bed Mobility Overal bed mobility: Needs Assistance Bed Mobility: Rolling, Sidelying to Sit Rolling: Supervision Sidelying to sit: Min assist        General bed mobility comments: increased time and cueing, minA to assist with trunk raising from flat bed, cues for use of rails; increased apparent effort partially due to cognitive deficit    Transfers Overall transfer level: Needs assistance Equipment used: Rolling walker (2 wheels) Transfers: Sit to/from Stand Sit to Stand: Min assist, Mod assist   Step pivot transfers: Min assist       General transfer comment: RW to power up and steady, cues for pushign from bed with RUE; chair pulled closer for pt to pivot to her L side due to c/o dizziness/lightheadedness and pain. HR/SpO2 WFL and BP WFL once in recliner    Ambulation/Gait                   Stairs             Wheelchair Mobility     Tilt Bed    Modified Rankin (Stroke Patients Only)       Balance Overall balance assessment: Needs assistance Sitting-balance support: Feet supported Sitting balance-Leahy Scale: Fair Sitting balance - Comments: supervision static sitting   Standing balance support: Bilateral upper extremity supported, During functional activity Standing balance-Leahy Scale: Poor Standing balance comment: relies on BUE and external support                            Cognition Arousal: Alert Behavior During Therapy: WFL for tasks assessed/performed Overall Cognitive Status: History of cognitive impairments - at baseline  General Comments: pt oriented and following simple commands with increased time.  Noted decreased initation and probelm solving, but cognition not formally tested.  Some decreased awareness as reports she was "wet with urine" but she wasn't. Some questionable history, as pt reports she was home for 3 months from SNF.        Exercises Other Exercises Other Exercises: supine RLE AROM: ankle pumps x10 reps - no active LLE DF, RN notified    General Comments General comments (skin integrity, edema, etc.):  BP 134/61 in chair, SpO2/HR [facility]      Pertinent Vitals/Pain Pain Assessment Pain Assessment: Faces Faces Pain Scale: Hurts little more Pain Location: LLE with movement, increases with weight bearing Pain Descriptors / Indicators: Grimacing, Guarding    Home Living                          Prior Function            PT Goals (current goals can now be found in the care plan section) Acute Rehab PT Goals Patient Stated Goal: none stated PT Goal Formulation: With patient Time For Goal Achievement: 10/02/23 Progress towards PT goals: Progressing toward goals    Frequency    Min 1X/week      PT Plan      Co-evaluation              AM-PAC PT "6 Clicks" Mobility   Outcome Measure  Help needed turning from your back to your side while in a flat bed without using bedrails?: A Little Help needed moving from lying on your back to sitting on the side of a flat bed without using bedrails?: A Little Help needed moving to and from a bed to a chair (including a wheelchair)?: A Little Help needed standing up from a chair using your arms (e.g., wheelchair or bedside chair)?: A Little Help needed to walk in hospital room?: Total Help needed climbing 3-5 steps with a railing? : Total 6 Click Score: 14    End of Session Equipment Utilized During Treatment: Gait belt Activity Tolerance: Patient limited by fatigue;Patient limited by pain Patient left: in chair;with call bell/phone within reach;with chair alarm set Nurse Communication: Mobility status;Other (comment) (L foot drop (not in chart), pt dizzy but BP stable in chair) PT Visit Diagnosis: Muscle weakness (generalized) (M62.81);Pain;Other abnormalities of gait and mobility (R26.89);Difficulty in walking, not elsewhere classified (R26.2) Pain - Right/Left: Left Pain - part of body: Leg     Time: 5573-2202 PT Time Calculation (min) (ACUTE ONLY): 19 min  Charges:    $Therapeutic Activity: 8-22 mins PT General  Charges $$ ACUTE PT VISIT: 1 Visit                     Jovee Dettinger P., PTA Acute Rehabilitation Services Secure Chat Preferred 9a-5:30pm Office: [phone]    Dorathy Kinsman [facility] 09/20/2023, 3:03 PM

## 2023-09-20 NOTE — Progress Notes (Addendum)
ID brief note   Remains afebrile  Lab with no leukocytosis     Latest Ref Rng & Units 09/20/2023    5:55 AM 09/19/2023    6:40 AM 09/18/2023    7:44 AM  CBC  WBC 4.0 - 10.5 K/uL 5.9  6.0  6.3   Hemoglobin 12.0 - 15.0 g/dL 7.7  8.3  7.2   Hematocrit 36.0 - 46.0 % 25.6  27.4  23.8   Platelets 150 - 400 K/uL 506  518  470       Latest Ref Rng & Units 09/20/2023    5:55 AM 09/19/2023    6:40 AM 09/18/2023    7:44 AM  CMP  Glucose 70 - 99 mg/dL 91  409  811   BUN 8 - 23 mg/dL 10  11  13    Creatinine 0.44 - 1.00 mg/dL 9.14  7.82  9.56   Sodium 135 - 145 mmol/L 142  143  140   Potassium 3.5 - 5.1 mmol/L 3.5  3.7  3.8   Chloride 98 - 111 mmol/L 112  111  108   CO2 22 - 32 mmol/L 22  22  21    Calcium 8.9 - 10.3 mg/dL 8.3  8.6  8.4   Total Protein 6.5 - 8.1 g/dL 5.4     Total Bilirubin <1.2 mg/dL 0.4     Alkaline Phos 38 - 126 U/L 39     AST 15 - 41 U/L 17     ALT 0 - 44 U/L 15      11/3 blood cx NG in 3 days  On linezolid and zosyn  She has not been able to go for surgical debridement due to dementia and unable to contact next of kin for consent. Primary is attempting to reach family members.   Monitor CBC on linezolid for any cytopenias in which case need to change to Vancomycin or daptomycin  Please re- call us back once more clarity in terms of surgical intervention to guide antibiotics management. Not much to add from ID standpoint currently  Odette Fraction, MD Infectious Disease Physician [facility] [address] [place], [place] [address] Phone: [phone]  Fax: [phone]

## 2023-09-20 NOTE — Plan of Care (Signed)
I have attempted to call all 3 family members listed on advanced directive with attempt to get consent for surgery and discuss code status for pt:  Monika Chestang, Esaw Grandchild, [place].   I have left multiple voice mails for Bonita Quin over the past few days. I left voice mails for the other two this morning for the first time.

## 2023-09-20 NOTE — Progress Notes (Addendum)
Daily Progress Note Intern Pager: [phone]  Patient name: Elizabeth Schwartz Medical record number: [id number] Date of birth: Nov 23, 1945 Age: 77 y.o. Gender: female  Primary Care Provider: Storm Frisk, MD Consultants: Ortho, plastic surgery, ID Code Status: full   Pt Overview and Major Events to Date:  11/03: admission   Assessment and Plan:   Elizabeth Schwartz is a 77 y.o. female presenting with left thigh wound after crush injury last month.  Imaging shows evidence of necrotizing myositis and acute early osteomyelitis. Vs stable, no leukocytosis or sepsis. Assessment & Plan   Necrotizing myositis Vitals reassuringly remained stable and she has been afebrile.  Ultimate plan still remains surgical debridement.  However, this is a complicated by patient's unclear capacity regarding medical decision making about surgery in the setting of Alzheimer's dementia and our team is unable to reach family members.  Plan for capacity eval as below. -Psych consult for capacity assessment -regular dressing changes, at least twice a day, with saline dampened gauze until she undergoes debridement -ID consult: linezolid 600 mg BID -IV zosyn per pharmacy  -Scheduled Tylenol for pain - f/u blood cx (no growth x 3 days) -TOC consult  -PT/OT -nutrition consult: MVI, vitamin C 250 mg bid  Other acute osteomyelitis of left femur (HCC) As above Type 2 diabetes mellitus without complication, unspecified whether long term insulin use (HCC) A1c 8/29 of 6.1.  Home medications include metformin 500 mg twice daily and she received SSI at SNF. Glucose 111 -Resume CGM due to resuming regular diet -Hold metformin Dementia, unspecified dementia severity, unspecified dementia type, unspecified whether behavioral, psychotic, or mood disturbance or anxiety (HCC) On chart review, it is Alzheimer's dementia.  She is unable to provide clear history.  Unable to reach Elizabeth Schwartz (patient's in chart decision maker)  via phone. Home medications listed include memantine 5 mg twice daily and Aricept 10 mg at bedtime.  -Delirium precautions -Fall precautions -Will continue to try to reach family members to confirm medications and code status -Memantine and Aricept continue Anemia, unspecified type Normocytic. Anemia panel done 05/31/2022 which was normal but unclear if she was taking iron at this time. Iron panel shows low TIBC not consistent with IDA, likely anemia of chronic disease such as from COPD. Reassuringly no signs of acute bleeding. Hb stable.   Chronic and Stable Problems:  HLD-Zetia 10 mg daily, fenofibrate 160 mg daily, not yet started, will confirm Schizophrenia-medication includes clozapine 200 mg at bedtime -ordered COPD-Symbicort 2 puffs twice daily, albuterol as needed -Dulera and albuterol ordered Osteoporosis-alendronate 70 mg once a week on Wednesdays per chart review.  Not ordered   FEN/GI: Regular diet VTE Prophylaxis: Lovenox held prior to OR Dispo: SNF, pending clinical improvement  Subjective:  Pt denies complaints or pain.   Objective: Temp:  [98.2 F (36.8 C)-98.7 F (37.1 C)] 98.2 F (36.8 C) (11/06 0747) Pulse Rate:  [84-100] 86 (11/06 0747) Resp:  [16-17] 17 (11/06 0747) BP: (95-132)/(41-66) 108/50 (11/06 0747) SpO2:  [96 %-97 %] 97 % (11/06 0747) Physical Exam: General: lying comfortably in bed, NAD, awake, alert, and responds to questions appropriately Cardiovascular: RRR, no murmur Extremities: left thigh with bandage, left lower extremity with nonpitting edema  Laboratory: Most recent CBC Lab Results  Component Value Date   WBC 5.9 09/20/2023   HGB 7.7 (L) 09/20/2023   HCT 25.6 (L) 09/20/2023   MCV 90.1 09/20/2023   PLT 506 (H) 09/20/2023   Most recent BMP  Latest Ref Rng & Units 09/20/2023    5:55 AM  BMP  Glucose 70 - 99 mg/dL 91   BUN 8 - 23 mg/dL 10   Creatinine 4.09 - 1.00 mg/dL 8.11   Sodium 914 - 782 mmol/L 142   Potassium 3.5 - 5.1  mmol/L 3.5   Chloride 98 - 111 mmol/L 112   CO2 22 - 32 mmol/L 22   Calcium 8.9 - 10.3 mg/dL 8.3    Elizabeth Schwartz, Medical Student 09/20/2023, 12:03 PM  [facility] FPTS Intern pager: [phone], text pages welcome Secure chat group [facility] Teaching Service    I was personally present and performed or re-performed the history, physical exam and medical decision making activities of this student/resident and have verified that the student/resident's  findings are accurately documented in the student/resident's note. I have made edits and changes where appropriate, and agree with plan.  Celine Mans, MD, PGY-2 [facility] 2:05 PM 09/20/2023                   09/20/2023, 2:05 PM

## 2023-09-20 NOTE — Plan of Care (Addendum)
Spoke with alternative contact Talmadge Coventry on the phone.  She was able to verify patient's name and date of birth.  She says that she has known patient for about 8 years, she works at the apartment complex that patient lives at.  States that patient has never really had family or friends involved in her life.  French Ana has met her niece Bonita Quin 1 time, states that she is not very involved in Sacora's care at all.  French Ana actually tried to call Bonita Quin and left a message but was unable to get in touch with her.  She is very worried about Pattie.  States that she is typically very aware of what is going on. Lives by herself in the apartment.  Still unable to get in contact with Baxter Kail, patient's emergency contact.

## 2023-09-21 DIAGNOSIS — L89224 Pressure ulcer of left hip, stage 4: Secondary | ICD-10-CM | POA: Diagnosis not present

## 2023-09-21 LAB — CBC
HCT: 26.2 % — ABNORMAL LOW (ref 36.0–46.0)
Hemoglobin: 8.1 g/dL — ABNORMAL LOW (ref 12.0–15.0)
MCH: 27.6 pg (ref 26.0–34.0)
MCHC: 30.9 g/dL (ref 30.0–36.0)
MCV: 89.1 fL (ref 80.0–100.0)
Platelets: 512 10*3/uL — ABNORMAL HIGH (ref 150–400)
RBC: 2.94 MIL/uL — ABNORMAL LOW (ref 3.87–5.11)
RDW: 15.8 % — ABNORMAL HIGH (ref 11.5–15.5)
WBC: 5.6 10*3/uL (ref 4.0–10.5)
nRBC: 0 % (ref 0.0–0.2)

## 2023-09-21 LAB — GLUCOSE, CAPILLARY
Glucose-Capillary: 100 mg/dL — ABNORMAL HIGH (ref 70–99)
Glucose-Capillary: 109 mg/dL — ABNORMAL HIGH (ref 70–99)
Glucose-Capillary: 101 mg/dL — ABNORMAL HIGH (ref 70–99)
Glucose-Capillary: 149 mg/dL — ABNORMAL HIGH (ref 70–99)

## 2023-09-21 LAB — BASIC METABOLIC PANEL
Anion gap: 11 (ref 5–15)
BUN: 9 mg/dL (ref 8–23)
CO2: 20 mmol/L — ABNORMAL LOW (ref 22–32)
Calcium: 8.3 mg/dL — ABNORMAL LOW (ref 8.9–10.3)
Chloride: 111 mmol/L (ref 98–111)
Creatinine, Ser: 1 mg/dL (ref 0.44–1.00)
GFR, Estimated: 58 mL/min — ABNORMAL LOW (ref >60)
Glucose, Bld: 92 mg/dL (ref 70–99)
Potassium: 3.6 mmol/L (ref 3.5–5.1)
Sodium: 142 mmol/L (ref 135–145)

## 2023-09-21 MED ORDER — WHITE PETROLATUM EX OINT
TOPICAL_OINTMENT | Freq: Every day | CUTANEOUS | Status: AC
  Filled 2023-09-21 (×2): qty 28.35
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
## 2023-09-21 NOTE — Progress Notes (Signed)
Occupational Therapy Treatment Patient Details Name: Elizabeth Schwartz MRN: [id number] DOB: 02-06-1946 Today's Date: 09/21/2023   History of present illness Pt is a 77 y/o F admitted on 09/17/23 after being advised by [facility] nurse to go to the ED after assessing pt's wound. CT showed necrotic myositis, pt also found to have acute osteomyelitis. PMH: anemia, COPD, dementia, depression, DM, HLD, HTN, osteoporosis, schizo-affective, SOB, syncope   OT comments  Patient supine in bed and agreeable to OT.  Completing LB dressing with mod assist, transfers with min assist using RW. Some difficulty following multiple step commands, sequencing and problem solving, but oriented and engaging well.  She has increased pain in L LE with mobility.  Orthostatics as below, pt reports mild dizziness that worsens in standing.   BP supine 144/67 (87) BP EOB     137/68 (87) BP standing (completed sitting due to weakness) 95/78 (85) BP sitting recliner 121/63 (81)      If plan is discharge home, recommend the following:  A little help with walking and/or transfers;Assistance with cooking/housework;Direct supervision/assist for financial management;Direct supervision/assist for medications management;Assist for transportation;Help with stairs or ramp for entrance;A little help with bathing/dressing/bathroom   Equipment Recommendations  Other (comment) (defer)    Recommendations for Other Services      Precautions / Restrictions Precautions Precautions: Fall Precaution Comments: LLE foot drop Restrictions Weight Bearing Restrictions: No       Mobility Bed Mobility Overal bed mobility: Needs Assistance Bed Mobility: Supine to Sit     Supine to sit: Supervision, HOB elevated     General bed mobility comments: increased time but no physical assist required    Transfers Overall transfer level: Needs assistance Equipment used: Rolling walker (2 wheels) Transfers: Sit to/from Stand, Bed to  chair/wheelchair/BSC Sit to Stand: Min assist     Step pivot transfers: Min assist     General transfer comment: using RW, cueing for technique and hand placement.     Balance Overall balance assessment: Needs assistance Sitting-balance support: Feet supported Sitting balance-Elizabeth Schwartz Scale: Fair     Standing balance support: Bilateral upper extremity supported, During functional activity Standing balance-Elizabeth Schwartz Scale: Poor Standing balance comment: relies on BUE and external support                           ADL either performed or assessed with clinical judgement   ADL Overall ADL's : Needs assistance/impaired     Grooming: Set up;Sitting               Lower Body Dressing: Moderate assistance;Sit to/from stand Lower Body Dressing Details (indicate cue type and reason): require assist with L side, min assist in standing Toilet Transfer: Minimal assistance Toilet Transfer Details (indicate cue type and reason): stand step to recliner using RW         Functional mobility during ADLs: Minimal assistance;Rolling walker (2 wheels)      Extremity/Trunk Assessment              Vision       Perception     Praxis      Cognition Arousal: Alert Behavior During Therapy: WFL for tasks assessed/performed Overall Cognitive Status: History of cognitive impairments - at baseline                                 General Comments: Pt oriented and following simple  commands with increased time.  She is internally distracted by pain in L LE with activity.  Some decreased problem solving and sequencing noted with transfers and following mulitple step commands. Formal cog assessment would be beneficial        Exercises      Shoulder Instructions       General Comments orthostatics taken see clinical impression    Pertinent Vitals/ Pain       Pain Assessment Pain Assessment: Faces Faces Pain Scale: Hurts little more Pain Location: LLE with  movement, increases with weight bearing Pain Descriptors / Indicators: Grimacing, Guarding Pain Intervention(s): Limited activity within patient's tolerance, Monitored during session, Repositioned  Home Living                                          Prior Functioning/Environment              Frequency  Min 1X/week        Progress Toward Goals  OT Goals(current goals can now be found in the care plan section)  Progress towards OT goals: Progressing toward goals  Acute Rehab OT Goals Patient Stated Goal: get better OT Goal Formulation: With patient Time For Goal Achievement: 10/03/23 Potential to Achieve Goals: Fair  Plan      Co-evaluation                 AM-PAC OT "6 Clicks" Daily Activity     Outcome Measure   Help from another person eating meals?: A Little Help from another person taking care of personal grooming?: A Little Help from another person toileting, which includes using toliet, bedpan, or urinal?: A Lot Help from another person bathing (including washing, rinsing, drying)?: A Lot Help from another person to put on and taking off regular upper body clothing?: A Little Help from another person to put on and taking off regular lower body clothing?: A Lot 6 Click Score: 15    End of Session Equipment Utilized During Treatment: Gait belt;Rolling walker (2 wheels)  OT Visit Diagnosis: Other abnormalities of gait and mobility (R26.89);Muscle weakness (generalized) (M62.81);Pain;Other symptoms and signs involving cognitive function Pain - Right/Left: Left Pain - part of body: Leg   Activity Tolerance Patient tolerated treatment well   Patient Left in chair;with call bell/phone within reach;with chair alarm set   Nurse Communication Mobility status;Precautions;Other (comment) (hypotension in standing)        Time: 1610-9604 OT Time Calculation (min): 19 min  Charges: OT General Charges $OT Visit: 1 Visit OT  Treatments $Self Care/Home Management : 8-22 mins  Barry Brunner, OT Acute Rehabilitation Services Office [phone]   Chancy Milroy 09/21/2023, 1:42 PM

## 2023-09-21 NOTE — Consult Note (Signed)
Decision being assessed:ability to discharge In an evaluation of capacity, each of the following criteria must be met based for a patient to have capacity to make the decision in question.   Criterion 1: The patient demonstrates a clear and consistent voluntary choice with regard to treatment options. Yes Criterion 2: The patient adequately understands the disease they have, the treatment proposed, the risks of treatment, and the risks of other treatment (including no treatment). Yes Criterion 3: The patient acknowledges that the details of Criterion 2 apply to them specifically and the likely consequences of treatment options proposed. Yes Criterion 4: The patient demonstrates adequate reasoning/rationality within the context of their decision and can provide justification for their choice. Yes  In this case, the patient Elizabeth Schwartz  DOES have capacity to make medical decisions regarding her surgery.   See patient interview below for details. Of note, this capacity evaluation assesses only for the specified decision documented above at the time of the assessment and is not a substitute for determination of the patient's overall competency, which can only be adjudicated.   Conclusion: At this time, there is NOT sufficient evidence to warrant removal of the patient's rights for medical decision-making as it pertains to surgery.  She can clearly determine mental capacity for decision-making.  Surrogate decision making for consent to medical procedures: Based on the chart review from attending physician Dr. Deirdre Priest and surgery, it appears surgical treatment (if pursued) should begin as soon as possible in order to give Elizabeth Schwartz the best chance of success.  While legal guardianship is ideal for the long-term benefit of a patient in this situation, there is no appointed guardian for Elizabeth Schwartz yet.  Given all of the above, it is appropriate for the attending physician, along with a specialist  expert in agreement, to make medical decisions regarding treatment on behalf of the patient. Please see below excerpt of [place] statutes regarding such situations.    See: [facility] Statutes Chapter 90. Medicine and Allied Occupations  90-21.13. Informed consent to health care treatment or procedure. In brief summary, this statute outlines that the following persons, in order indicated, are authorized to consent to medical treatment on behalf of the patient who does not demonstrate capacity to do so: Legally assigned guardian appointed by the court > healthcare agent appointed by legal healthcare power of attorney > healthcare agent appointed by the patient > legal spouse > majority of the patient's reasonably available parents and children who are at least 28 years of age > individual who has an established relationship with the patient, who is acting in good faith on behalf of the patient, and who can reliably convey the patient's wishes > attending physician with confirmation by another physician of the patient's condition and the necessity for treatment (unless in urgent/emergent situation)     Ethical principles: Consent and assent: In the event this patient, is to ever lack capacity to CONSENT to medical decisions, She is still able to give ASSENT. Given repeated documentation by different team members indicating Elizabeth Schwartz has stated she wishes to pursue treatment for her wound, it would be reasonable to conclude she generally gives ASSENT for treatment, even if she cannot fully understand the details of such treatment.  Beneficence and non-maleficence: It would be ethically inappropriate to make a largely unilateral decision without regarding the risks and benefits to the patient. Thus, we recommend the attending physician and pertinent expert (a surgeon in this case) meet to consider  the risks and benefits of the proposed treatment for this patient, in context of the patient's  overall desire for treatment and medical improvement.   Medical Decision-Making Capacity: Patient has capacity to make task-specific medical decisions.   It is important to note that decision-making capacity is assessed for a specific decision at a specific moment in time. The reason for this is different decisions have different risks and benefits. The threshold for an individual to display adequate decision-making capacity to provide consent to or refuse treatment should reflect the risks and benefits of that specific decision. Furthermore, decision-making capacity is time sensitive meaning if an individual does not have the capacity to make a decision one day, this does not mean that they will not have the capacity to make the same medical decision at a later time.   Psychiatry consult service to sign off at this time.  Thank you for this capacity consult.
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
## 2023-09-21 NOTE — Plan of Care (Signed)
  Problem: Education: Goal: Ability to describe self-care measures that may prevent or decrease complications (Diabetes Survival Skills Education) will improve Outcome: Progressing Goal: Individualized Educational Video(s) Outcome: Progressing   

## 2023-09-22 DIAGNOSIS — L89224 Pressure ulcer of left hip, stage 4: Secondary | ICD-10-CM | POA: Diagnosis not present

## 2023-09-22 LAB — CBC
HCT: 25.5 % — ABNORMAL LOW (ref 36.0–46.0)
Hemoglobin: 7.5 g/dL — ABNORMAL LOW (ref 12.0–15.0)
MCH: 26.2 pg (ref 26.0–34.0)
MCHC: 29.4 g/dL — ABNORMAL LOW (ref 30.0–36.0)
MCV: 89.2 fL (ref 80.0–100.0)
Platelets: 483 10*3/uL — ABNORMAL HIGH (ref 150–400)
RBC: 2.86 MIL/uL — ABNORMAL LOW (ref 3.87–5.11)
RDW: 15.9 % — ABNORMAL HIGH (ref 11.5–15.5)
WBC: 6.1 10*3/uL (ref 4.0–10.5)
nRBC: 0 % (ref 0.0–0.2)

## 2023-09-22 LAB — GLUCOSE, CAPILLARY
Glucose-Capillary: 131 mg/dL — ABNORMAL HIGH (ref 70–99)
Glucose-Capillary: 209 mg/dL — ABNORMAL HIGH (ref 70–99)
Glucose-Capillary: 71 mg/dL (ref 70–99)
Glucose-Capillary: 118 mg/dL — ABNORMAL HIGH (ref 70–99)

## 2023-09-22 LAB — CULTURE, BLOOD (ROUTINE X 2)
Culture: NO GROWTH
Culture: NO GROWTH
Special Requests: ADEQUATE

## 2023-09-22 NOTE — Progress Notes (Signed)
Physical Therapy Treatment Patient Details Name: Elizabeth Schwartz MRN: [id number] DOB: 25-Jun-1946 Today's Date: 09/22/2023   History of Present Illness Pt is a 77 y/o F admitted on 09/17/23 after being advised by [facility] nurse to go to the ED after assessing pt's wound. CT showed necrotic myositis, pt also found to have acute osteomyelitis. Pt found to have symptomatic orthostatic hypotension during therapies 11/6-11/8. PMH: anemia, COPD, dementia, depression, DM, HLD, HTN, osteoporosis, schizo-affective, SOB, syncope.    PT Comments  Pt received in supine, agreeable to therapy session and pleasantly cooperative throughout. Pt continues to demonstrate symptomatic orthostatic hypotension this date despite BLE thigh high TED hose on during session, and also has difficulty sequencing on L hemibody, especially with LUE but to some extent with LLE as well, MD notified. Could consider abdominal binder for OOB mobility to see if this improves hemodynamic stability. Of note, wound seen on bottom of her L great toe and pt c/o increased L foot/toe pain this date. Initiated short gait trial at bedside after pivot OOB to chair but pt remains symptomatic (pain/dizziness) in standing and progressed to ~59ft with RW and min to modA (+2 safety with chair follow). Pt continues to benefit from PT services to progress toward functional mobility goals.    09/22/23 1100  Vital Signs  Patient Position (if appropriate) Orthostatic Vitals  Orthostatic Lying   BP- Lying 152/68  Orthostatic Sitting  BP- Sitting 132/55  Pulse- Sitting 94  Orthostatic Standing at 0 minutes  BP- Standing at 0 minutes 115/52  Pulse- Standing at 0 minutes 106  Orthostatic Standing at 3 minutes  BP- Standing at 3 minutes  (pt unable to tolerate standing for 3 mins initially)     If plan is discharge home, recommend the following: A little help with bathing/dressing/bathroom;Assistance with cooking/housework;Supervision due to cognitive  status;Direct supervision/assist for financial management;Assist for transportation;Help with stairs or ramp for entrance;Direct supervision/assist for medications management;A lot of help with walking and/or transfers   Can travel by private vehicle     Yes (may have difficulty with higher car seat/SUV transfer)  Equipment Recommendations  Rolling walker (2 wheels);BSC/3in1    Recommendations for Other Services       Precautions / Restrictions Precautions Precautions: Fall Precaution Comments: LLE foot drop, L shoulder pain with decreased ROM Required Braces or Orthoses:  (BLE thigh high TED hose in place during session) Restrictions Weight Bearing Restrictions: No     Mobility  Bed Mobility Overal bed mobility: Needs Assistance Bed Mobility: Rolling, Sidelying to Sit Rolling: Supervision Sidelying to sit: Min assist, Used rails       General bed mobility comments: pt having difficulty understanding to let go of L rail with LUE when sitting up on R side of bed, pt cued for cross-body reaching to opposite bed rail and able to perform with tactile and verbal cues and increased time, CGA to bring BLE over EOB and up to minA to raise trunk and complete advancement of hips to EOB.    Transfers Overall transfer level: Needs assistance Equipment used: Rolling walker (2 wheels) Transfers: Sit to/from Stand, Bed to chair/wheelchair/BSC Sit to Stand: Min assist   Step pivot transfers: Min assist, +2 safety/equipment       General transfer comment: using RW, cueing for technique and hand placement. Pt does better with painful LUE on RW handle and pushing from surface with RUE. Pt with delayed processing when cued to reach back with LUE to chair/EOB surface prior to sitting,  pt tending to sit close to R side of chair each rep despite dense cues for looking to her L prior to reaching back/sitting, pt needed hand over hand assist to perform; also orthostatic BP reading in standing which  could have been a factor.    Ambulation/Gait Ambulation/Gait assistance: Min assist, +2 safety/equipment, Mod assist Gait Distance (Feet): 8 Feet Assistive device: Rolling walker (2 wheels) Gait Pattern/deviations: Step-to pattern, Trunk flexed, Decreased dorsiflexion - left, Decreased weight shift to left, Decreased step length - left       General Gait Details: Pt with short low steps, LLE wrapped into dorsiflexion prior to gait trial which seemed to help with LLE placement, and quick to fatigue (see BP above), pt performed ~36ft fwd and 60ft backward prior to needing to sit back in chair due to pain/fatigue/dizziness. Heavy assist to manage RW, modA with backward stepping for RW mgmt and stability as she fatigue. Hand over hand assist to reach back for chair.   Stairs             Wheelchair Mobility     Tilt Bed    Modified Rankin (Stroke Patients Only)       Balance Overall balance assessment: Needs assistance Sitting-balance support: Feet supported Sitting balance-Leahy Scale: Fair     Standing balance support: Bilateral upper extremity supported, During functional activity Standing balance-Leahy Scale: Poor Standing balance comment: relies on BUE and external support                            Cognition Arousal: Alert Behavior During Therapy: WFL for tasks assessed/performed Overall Cognitive Status: History of cognitive impairments - at baseline                                 General Comments: Pt oriented and following simple commands with increased time. She is internally distracted by pain in L LE and due to dizziness with activity.  Some decreased problem solving and sequencing noted with transfers and following mulitple step commands, especially on LUE, RN/MD notified. Formal cog assessment would be beneficial. Pt able to recall this therapist from previous session but had difficulty remembering name of mobility specialist during  session.        Exercises Other Exercises Other Exercises: supine RLE AROM: ankle pumps x10 reps - no active LLE DF, RN/MD previously notified and aware Other Exercises: seated RUE AROM: elbow/shoulder flex/ex x10 reps while seated (pt attempts on LUE but states unable to perform due to pain/weakness, she is able to move L wrist flex/ext x10 reps)    General Comments General comments (skin integrity, edema, etc.): Closed wound on bottom of L great toe, RN notified and states he is aware, pt c/o L 1&2nd toe pain. See orthostatic BP taken in comments above; HR elevated somewhat in standing.      Pertinent Vitals/Pain Pain Assessment Pain Assessment: 0-10 Pain Score: 5  Pain Location: LLE (1st and 2nd toes), thigh where wound is, and L shoulder (chronic per pt) Pain Descriptors / Indicators: Grimacing, Guarding, Discomfort, Tender Pain Intervention(s): Limited activity within patient's tolerance, Monitored during session, Repositioned, Patient requesting pain meds-RN notified    Home Living                          Prior Function  PT Goals (current goals can now be found in the care plan section) Acute Rehab PT Goals Patient Stated Goal: none stated PT Goal Formulation: With patient Time For Goal Achievement: 10/02/23 Progress towards PT goals: Progressing toward goals    Frequency    Min 1X/week      PT Plan      Co-evaluation              AM-PAC PT "6 Clicks" Mobility   Outcome Measure  Help needed turning from your back to your side while in a flat bed without using bedrails?: A Little Help needed moving from lying on your back to sitting on the side of a flat bed without using bedrails?: A Little Help needed moving to and from a bed to a chair (including a wheelchair)?: A Little Help needed standing up from a chair using your arms (e.g., wheelchair or bedside chair)?: A Lot Help needed to walk in hospital room?: Total Help needed  climbing 3-5 steps with a railing? : Total 6 Click Score: 13    End of Session Equipment Utilized During Treatment: Gait belt;Other (comment) (ace wrap to LLE to maintain better DF due to foot drop) Activity Tolerance: Patient tolerated treatment well;Treatment limited secondary to medical complications (Comment);Other (comment) (symptomatic orthostatic hypotension limiting standing tolerance) Patient left: in chair;with call bell/phone within reach;with chair alarm set Nurse Communication: Mobility status;Other (comment);Precautions (L 1st toe wound, symptomatic orthostatic hypotension, decreased LUE awareness/sequencing, L foot drop) PT Visit Diagnosis: Muscle weakness (generalized) (M62.81);Pain;Other abnormalities of gait and mobility (R26.89);Difficulty in walking, not elsewhere classified (R26.2) Pain - Right/Left: Left Pain - part of body: Leg;Shoulder;Ankle and joints of foot     Time: 1610-9604 PT Time Calculation (min) (ACUTE ONLY): 31 min  Charges:    $Gait Training: 8-22 mins $Therapeutic Activity: 8-22 mins PT General Charges $$ ACUTE PT VISIT: 1 Visit                     Elizabeth Schwartz P., PTA Acute Rehabilitation Services Secure Chat Preferred 9a-5:30pm Office: [phone]    Dorathy Kinsman [facility] 09/22/2023, 12:13 PM

## 2023-09-22 NOTE — Progress Notes (Signed)
Pharmacy Antibiotic Note  Elizabeth Schwartz is a 77 y.o. female admitted on 09/17/2023 presenting with open thigh wound with CT concerning for necrotizing myositis.  Pharmacy has been consulted for zosyn dosing.    Afebrile, BP 118/46, HR 88, RR 14 wnl  Last labs 11/7: WBC 6.1, stable Scr 1.0, stable   Plan: Continue Zosyn 3.375g IV q 8h (extended 4h infusion) Continue linezolid 600mg  PO BID Follow up surgical debridement scheduled for Monday 11/11 Follow up surgical cultures to narrow Renal function stable, will sign off and follow peripherally. Please  re-consult pharmacy for further recommendations   Height: 5\' 5"  (165.1 cm) Weight: 74.8 kg (165 lb) IBW/kg (Calculated) : 57  Temp (24hrs), Avg:98.3 F (36.8 C), Min:97.8 F (36.6 C), Max:98.7 F (37.1 C)  Recent Labs  Lab 09/17/23 1227 09/17/23 1319 09/17/23 1456 09/18/23 0744 09/19/23 0640 09/20/23 0555 09/21/23 0750 09/22/23 0612  WBC 10.0  --   --  6.3 6.0 5.9 5.6 6.1  CREATININE 0.96  --   --  0.98 1.07* 0.93 1.00  --   LATICACIDVEN  --  2.0* 1.8  --   --   --   --   --     Estimated Creatinine Clearance: 47.7 mL/min (by C-G formula based on SCr of 1 mg/dL).    Allergies  Allergen Reactions   Crestor [Rosuvastatin] Other (See Comments)    Muscle aches.    Antimicrobials this admission: Zosyn 11/03 >> Vanc 11/03 >>11/4 Linezolid 11/4>>   Stephenie Acres, PharmD PGY1 Pharmacy Resident 09/22/2023 3:30 PM

## 2023-09-22 NOTE — TOC Progression Note (Signed)
Transition of Care [facility]) - Progression Note    Patient Details  Name: Elizabeth Schwartz MRN: [id number] Date of Birth: Apr 15, 1946  [facility] of [facility]) CM/SW Contact  Lorri Frederick, LCSW Phone Number: 09/22/2023, 12:17 PM  Clinical Narrative:   PASSR received: [id number].    Expected Discharge Plan: Skilled Nursing Facility Barriers to Discharge: Continued Medical Work up, Other (must enter comment) (Waiting for PASRR to remove existing submission of screening)  Expected Discharge Plan and Services                                               Social Determinants of Health (SDOH) Interventions SDOH Screenings   Food Insecurity: No Food Insecurity (09/18/2023)  Housing: Patient Unable To Answer (09/18/2023)  Transportation Needs: No Transportation Needs (09/18/2023)  Utilities: Not At Risk (09/18/2023)  Alcohol Screen: Low Risk  (03/22/2023)  Depression (PHQ2-9): Low Risk  (07/13/2023)  Financial Resource Strain: Low Risk  (03/22/2023)  Physical Activity: Insufficiently Active (03/22/2023)  Social Connections: Socially Isolated (03/22/2023)  Stress: No Stress Concern Present (03/22/2023)  Tobacco Use: Medium Risk (09/19/2023)    Readmission Risk Interventions     No data to display
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
## 2023-09-23 DIAGNOSIS — E119 Type 2 diabetes mellitus without complications: Secondary | ICD-10-CM

## 2023-09-23 DIAGNOSIS — L89224 Pressure ulcer of left hip, stage 4: Secondary | ICD-10-CM | POA: Diagnosis not present

## 2023-09-23 DIAGNOSIS — F0390 Unspecified dementia without behavioral disturbance: Secondary | ICD-10-CM | POA: Insufficient documentation

## 2023-09-23 DIAGNOSIS — M6080 Other myositis, unspecified site: Principal | ICD-10-CM | POA: Insufficient documentation

## 2023-09-23 LAB — GLUCOSE, CAPILLARY
Glucose-Capillary: 106 mg/dL — ABNORMAL HIGH (ref 70–99)
Glucose-Capillary: 130 mg/dL — ABNORMAL HIGH (ref 70–99)
Glucose-Capillary: 113 mg/dL — ABNORMAL HIGH (ref 70–99)
Glucose-Capillary: 133 mg/dL — ABNORMAL HIGH (ref 70–99)

## 2023-09-23 LAB — CBC
HCT: 26.7 % — ABNORMAL LOW (ref 36.0–46.0)
Hemoglobin: 8.1 g/dL — ABNORMAL LOW (ref 12.0–15.0)
MCH: 27.1 pg (ref 26.0–34.0)
MCHC: 30.3 g/dL (ref 30.0–36.0)
MCV: 89.3 fL (ref 80.0–100.0)
Platelets: 433 10*3/uL — ABNORMAL HIGH (ref 150–400)
RBC: 2.99 MIL/uL — ABNORMAL LOW (ref 3.87–5.11)
RDW: 16.2 % — ABNORMAL HIGH (ref 11.5–15.5)
WBC: 5.8 10*3/uL (ref 4.0–10.5)
nRBC: 0 % (ref 0.0–0.2)

## 2023-09-23 NOTE — Assessment & Plan Note (Addendum)
On chart review, it is Alzheimer's dementia.  She is unable to provide clear history. Unable to reach family.  Home medications listed include memantine 5 mg twice daily and Aricept 10 mg at bedtime. -Delirium precautions -Fall precautions -Will attempt to call niece, Bonita Quin again to confirm medications and code status -Memantine and Aricept ordered

## 2023-09-23 NOTE — Assessment & Plan Note (Signed)
Imaging consistent with necrotizing myositis and likely early acute osteomyelitis.  Plan for irrigation and debridement on 11/11 with likely wound VAC placement plus or minus partial closure. -Plastic surgery consulted, pending surgery 11/11, appreciate recs -wound care -regular dressing changes, at least twice a day, with saline dampened gauze until she undergoes debridement -PO linezolid 600 mg BID -IV zosyn per pharmacy  -Scheduled Tylenol for pain -f/u blood cx (no growth x 5 days) -TOC consult

## 2023-09-23 NOTE — Assessment & Plan Note (Signed)
On chart review, it is Alzheimer's dementia.  She is unable to provide clear history.  I tried to call her niece, Elizabeth Schwartz, to get more information on patient including code status but was unable to reach her.  Home medications listed include memantine 5 mg twice daily and Aricept 10 mg at bedtime. -Delirium precautions -Fall precautions -Will attempt to call niece, Elizabeth Schwartz again to confirm medications and code status -Memantine and Aricept ordered

## 2023-09-23 NOTE — Progress Notes (Signed)
   09/23/23 1520  Mobility  Activity Transferred from bed to chair  Level of Assistance Minimal assist, patient does 75% or more (Min+2 for ambulation. ModA for Bed Mobility and STS.)  Assistive Device Front wheel walker  Distance Ambulated (ft) 3 ft  Activity Response Tolerated fair  Mobility Referral Yes  $Mobility charge 1 Mobility  Mobility Specialist Start Time (ACUTE ONLY) 1444  Mobility Specialist Stop Time (ACUTE ONLY) 1520  Mobility Specialist Time Calculation (min) (ACUTE ONLY) 36 min   Mobility Specialist: Progress Note  Pt agreeable to mobility session - received in bed. Required ModA+2 for Bed Mobility and STS, MinA+2 using RW. C/o LLE pain and dizziness sitting EOB. Returned to chair with all needs met - call bell within reach. Chair alarm on.   Pt with void and loose stool x2, pericare completed with assistance.   Barnie Mort, BS Mobility Specialist Please contact via SecureChat or Rehab office at [phone].

## 2023-09-23 NOTE — Assessment & Plan Note (Addendum)
Normocytic.  Hemoglobin pending this a.m., baseline appears to be around 10.  Iron supplement is on medication list but need to confirm she has been taking it.  Anemia panel done 05/31/2022 which was normal but unclear if she was taking iron at this time. -CTM with AM CBC -anemia panel

## 2023-09-23 NOTE — Assessment & Plan Note (Signed)
A1c 8/29 of 6.1.  Home medications include metformin 500 mg twice daily and she received SSI at SNF. Glucose low 100's. -CGM -Hold metformin

## 2023-09-23 NOTE — Assessment & Plan Note (Signed)
Imaging consistent with necrotizing myositis and likely early acute osteomyelitis.  Ortho has been consulted and seen patient.  They plan for surgical debridement in next 2-3 days, plastic surgery (Ortho has consulted), wound care, and will likely need wound VAC.  They recommend wet-to-dry dry dressings prior to surgery.  Patient currently in mild pain. -Patient admitted to MedSurg, attending Dr. McDiarmid -Ortho consulted, follow recs -Plastic surgery consulted, follow recs -consult wound care -Will consult ID tomorrow -IV vanc and zosyn per pharmacy  -Scheduled Tylenol for pain - f/u blood cx

## 2023-09-23 NOTE — Assessment & Plan Note (Signed)
As above.

## 2023-09-23 NOTE — Progress Notes (Addendum)
Daily Progress Note Intern Pager: [phone]  Patient name: Elizabeth Schwartz Medical record number: [id number] Date of birth: [birth year] Age: 77 y.o. Gender: female  Primary Care Provider: Storm Frisk, MD Consultants: Plastic surgery, ID, Ortho  Code Status: FULL  Pt Overview and Major Events to Date:  11/3-admitted  Assessment and Plan:  Elizabeth Schwartz is a 77 year old female presenting with left thigh wound after crush injury last month.  Imaging evident for necrotizing myositis and early osteomyelitis.  Patient is awaiting irrigation and debridement on Monday 11/11. Assessment & Plan Necrotizing myositis Imaging consistent with necrotizing myositis and likely early acute osteomyelitis.  Plan for irrigation and debridement on 11/11 with likely wound VAC placement plus or minus partial closure. -Plastic surgery consulted, pending surgery 11/11, appreciate recs -wound care -regular dressing changes, at least twice a day, with saline dampened gauze until she undergoes debridement -PO linezolid 600 mg BID -IV zosyn per pharmacy  -Scheduled Tylenol for pain -f/u blood cx (no growth x 5 days) -TOC consult  Osteomyelitis of left femur (HCC) As above Diabetes mellitus without complication (HCC) A1c 8/29 of 6.1.  Home medications include metformin 500 mg twice daily and she received SSI at SNF. Glucose low 100's. -CGM -Hold metformin Dementia (HCC) On chart review, it is Alzheimer's dementia.  She is unable to provide clear history. Unable to reach family.  Home medications listed include memantine 5 mg twice daily and Aricept 10 mg at bedtime. -Delirium precautions -Fall precautions -Will attempt to call niece, Bonita Quin again to confirm medications and code status -Memantine and Aricept ordered  Anemia Normocytic.  Hemoglobin pending this a.m., baseline appears to be around 10.  Iron supplement is on medication list but need to confirm she has been taking it.  Anemia  panel done 05/31/2022 which was normal but unclear if she was taking iron at this time. -CTM with AM CBC -anemia panel  Chronic and Stable Issues: HLD-Have not started home Zetia 10 mg daily, fenofibrate 160 mg daily (unclear if taking) Schizophrenia-continue home clozapine 200 mg at bedtime  COPD-Continue Symbicort 2 puffs twice daily, albuterol as needed Osteoporosis-Have not ordered alendronate 70 mg once a week on Wednesdays per chart review    FEN/GI: Regular PPx: Lovenox Dispo:SNF pending clinical improvement . Barriers include surgical intervention 11/11.   Subjective:  Patient denies any issues overnight.  Denies any pain while in the room.  Objective: Temp:  [98.1 F (36.7 C)-98.7 F (37.1 C)] 98.2 F (36.8 C) (11/08 2023) Pulse Rate:  [87-91] 88 (11/08 2023) Resp:  [14-20] 20 (11/08 2023) BP: (118-133)/(46-62) 127/52 (11/08 2023) SpO2:  [94 %-96 %] 94 % (11/08 2023) Physical Exam: General: NAD, awake, alert, responsive to all questions Cardiovascular: RRR, no murmurs of the gallops Respiratory: Clear to auscultation bilaterally Abdomen: Soft, nontender to palpation, nondistended Extremities: Left thigh dressing CDI, extremity well-perfused, nontender  Laboratory: Most recent CBC Lab Results  Component Value Date   WBC 6.1 09/22/2023   HGB 7.5 (L) 09/22/2023   HCT 25.5 (L) 09/22/2023   MCV 89.2 09/22/2023   PLT 483 (H) 09/22/2023   Most recent BMP    Latest Ref Rng & Units 09/21/2023    7:50 AM  BMP  Glucose 70 - 99 mg/dL 92   BUN 8 - 23 mg/dL 9   Creatinine 0.63 - 0.16 mg/dL 0.10   Sodium 932 - 355 mmol/L 142   Potassium 3.5 - 5.1 mmol/L 3.6   Chloride 98 - 111  mmol/L 111   CO2 22 - 32 mmol/L 20   Calcium 8.9 - 10.3 mg/dL 8.3     Levin Erp, MD 09/23/2023, 2:33 AM  PGY-3, [facility] FPTS Intern pager: [phone], text pages welcome Secure chat group [facility] Teaching Service

## 2023-09-24 DIAGNOSIS — S81802D Unspecified open wound, left lower leg, subsequent encounter: Secondary | ICD-10-CM | POA: Diagnosis not present

## 2023-09-24 DIAGNOSIS — L89224 Pressure ulcer of left hip, stage 4: Secondary | ICD-10-CM | POA: Diagnosis not present

## 2023-09-24 LAB — CBC
HCT: 25 % — ABNORMAL LOW (ref 36.0–46.0)
Hemoglobin: 7.4 g/dL — ABNORMAL LOW (ref 12.0–15.0)
MCH: 26.9 pg (ref 26.0–34.0)
MCHC: 29.6 g/dL — ABNORMAL LOW (ref 30.0–36.0)
MCV: 90.9 fL (ref 80.0–100.0)
Platelets: 493 10*3/uL — ABNORMAL HIGH (ref 150–400)
RBC: 2.75 MIL/uL — ABNORMAL LOW (ref 3.87–5.11)
RDW: 16.5 % — ABNORMAL HIGH (ref 11.5–15.5)
WBC: 5.8 10*3/uL (ref 4.0–10.5)
nRBC: 0 % (ref 0.0–0.2)

## 2023-09-24 LAB — GLUCOSE, CAPILLARY
Glucose-Capillary: 156 mg/dL — ABNORMAL HIGH (ref 70–99)
Glucose-Capillary: 118 mg/dL — ABNORMAL HIGH (ref 70–99)
Glucose-Capillary: 74 mg/dL (ref 70–99)
Glucose-Capillary: 119 mg/dL — ABNORMAL HIGH (ref 70–99)

## 2023-09-24 MED ORDER — CHLORHEXIDINE GLUCONATE CLOTH 2 % EX PADS
6.0000 | MEDICATED_PAD | Freq: Once | CUTANEOUS | Status: AC
  Administered 2023-09-25: 6 via TOPICAL

## 2023-09-24 NOTE — Assessment & Plan Note (Addendum)
Stable.  Minimally elevated blood glucose, continue to monitor. - CGM - Hold home metformin

## 2023-09-24 NOTE — H&P (View-Only) (Signed)
Spoke with Elizabeth Schwartz this morning regarding her procedure tomorrow.  Debridement was postponed last week due to a question of her ability to understand what was going on and competently sign her consent form.  I was notified by the medical student who is working with Elizabeth Schwartz and there is document in one of the resident notes that psychiatry has established that she is competent to sign her consent form.  I am unable to find a note in the chart from psychiatry but will proceed based on the primary care teams documentation.  Elizabeth Schwartz states that she would like to have something done about the leg.  She states that the wound has been there for many years and she has been neglecting up but would like to proceed with addressing it now.  She seemed to understand that we will go to the operating room tomorrow afternoon and that the wound would be inspected and I would remove any nonviable tissue.  She states that she is in agreement to this plan.  Will proceed with wound evaluation under anesthesia.  I will debride any obvious nonviable tissue and then leave recommendations for ongoing wound care.  When it is appropriate we will begin reconstruction.

## 2023-09-24 NOTE — Progress Notes (Signed)
Spoke with Ms. Rivest this morning regarding her procedure tomorrow.  Debridement was postponed last week due to a question of her ability to understand what was going on and competently sign her consent form.  I was notified by the medical student who is working with Ms. Shook and there is document in one of the resident notes that psychiatry has established that she is competent to sign her consent form.  I am unable to find a note in the chart from psychiatry but will proceed based on the primary care teams documentation.  Ms. Assad states that she would like to have something done about the leg.  She states that the wound has been there for many years and she has been neglecting up but would like to proceed with addressing it now.  She seemed to understand that we will go to the operating room tomorrow afternoon and that the wound would be inspected and I would remove any nonviable tissue.  She states that she is in agreement to this plan.  Will proceed with wound evaluation under anesthesia.  I will debride any obvious nonviable tissue and then leave recommendations for ongoing wound care.  When it is appropriate we will begin reconstruction.

## 2023-09-24 NOTE — Assessment & Plan Note (Signed)
Pending irrigation and debridement on 11/11 with wound VAC placement plus or minus partial closure. - Plastic surgery consulted, appreciate recs - Wound care - P.o. linezolid 600 mg twice daily - IV Zosyn per pharmacy - Tylenol for pain - TOC consult

## 2023-09-24 NOTE — Assessment & Plan Note (Addendum)
Alzheimer's dementia. - Memantine 5 mg twice daily, Aricept 10 mg at bedtime - Daily precautions - Fall precautions

## 2023-09-24 NOTE — Assessment & Plan Note (Signed)
As above.

## 2023-09-24 NOTE — Assessment & Plan Note (Signed)
Normocytic anemia.  Stable hemoglobin.  Suspect component of anemia of chronic disease.  No evidence of bleeding. - AM CBC

## 2023-09-24 NOTE — Progress Notes (Addendum)
     Daily Progress Note Intern Pager: [phone]  Patient name: Elizabeth Schwartz Medical record number: [id number] Date of birth: 07-Sep-1946 Age: 77 y.o. Gender: female  Primary Care Provider: Storm Frisk, MD Consultants: Plastic surgery, ID, Ortho Code Status: Full code  Pt Overview and Major Events to Date:  11/3: Admitted  Assessment and Plan:  Elizabeth Schwartz is a 77 year old female presenting with left thigh wound after crush injury last month.  Imaging evident for necrotizing myositis and early osteomyelitis.  Patient awaiting irrigation debridement on Monday 11/11. Assessment & Plan Necrotizing myositis Pending irrigation and debridement on 11/11 with wound VAC placement plus or minus partial closure. - Plastic surgery consulted, appreciate recs - Wound care - P.o. linezolid 600 mg twice daily - IV Zosyn per pharmacy - Tylenol for pain - TOC consult Osteomyelitis of left femur (HCC) As above Diabetes mellitus without complication (HCC) Stable.  Minimally elevated blood glucose, continue to monitor. - CGM - Hold home metformin Dementia (HCC) Alzheimer's dementia. - Memantine 5 mg twice daily, Aricept 10 mg at bedtime - Daily precautions - Fall precautions Anemia Normocytic anemia.  Stable hemoglobin.  Suspect component of anemia of chronic disease.  No evidence of bleeding. - AM CBC   Chronic and Stable Problems: HLD: Hold Zetia 10 mg daily, fenofibrate 100 mg daily Schizophrenia: Clozapine 200 mg nightly COPD: Symbicort 2 puffs twice daily, albuterol as needed Osteoporosis: Hold alendronate 70 mg weekly   FEN/GI: Regular PPx: Lovenox, hold 24 hours before surgery Dispo:SNF pending clinical improvement . Barriers include surgical intervention 11/11.   Subjective:  Reports some difficulty sleeping.  No other acute concerns.  Pain is fairly well-controlled.  Objective: Temp:  [97.5 F (36.4 C)-98.8 F (37.1 C)] 97.5 F (36.4 C) (11/09  1948) Pulse Rate:  [82-96] 85 (11/09 1950) Resp:  [16-18] 16 (11/09 1950) BP: (127-144)/(48-60) 128/48 (11/09 1948) SpO2:  [94 %-97 %] 96 % (11/09 1950) Physical Exam: General: NAD, resting comfortably in bedside chair Cardiovascular: RRR, no murmurs Respiratory: CTAB, normal work of breathing on room air Extremities: Moving all 4 extremities.  Ace bandage left leg in place.  Mild tenderness with pressure over wound site.  Laboratory: Most recent CBC Lab Results  Component Value Date   WBC 5.8 09/23/2023   HGB 8.1 (L) 09/23/2023   HCT 26.7 (L) 09/23/2023   MCV 89.3 09/23/2023   PLT 433 (H) 09/23/2023   Most recent BMP    Latest Ref Rng & Units 09/21/2023    7:50 AM  BMP  Glucose 70 - 99 mg/dL 92   BUN 8 - 23 mg/dL 9   Creatinine 1.30 - 8.65 mg/dL 7.84   Sodium 696 - 295 mmol/L 142   Potassium 3.5 - 5.1 mmol/L 3.6   Chloride 98 - 111 mmol/L 111   CO2 22 - 32 mmol/L 20   Calcium 8.9 - 10.3 mg/dL 8.3     Tiffany Kocher, DO 09/24/2023, 12:35 AM  PGY-2, [facility] FPTS Intern pager: [phone], text pages welcome Secure chat group [facility] Teaching Service

## 2023-09-25 ENCOUNTER — Encounter (HOSPITAL_COMMUNITY)
Admission: EM | Disposition: A | Discharge status: Skilled Nursing Facility | Payer: Self-pay | Source: Home / Self Care | Attending: Family Medicine

## 2023-09-25 ENCOUNTER — Encounter (HOSPITAL_COMMUNITY): Payer: Self-pay | Admitting: Student

## 2023-09-25 ENCOUNTER — Inpatient Hospital Stay (HOSPITAL_COMMUNITY): Payer: Medicare Other | Admitting: Anesthesiology

## 2023-09-25 DIAGNOSIS — I96 Gangrene, not elsewhere classified: Secondary | ICD-10-CM

## 2023-09-25 DIAGNOSIS — S71102A Unspecified open wound, left thigh, initial encounter: Secondary | ICD-10-CM | POA: Diagnosis not present

## 2023-09-25 DIAGNOSIS — I951 Orthostatic hypotension: Secondary | ICD-10-CM

## 2023-09-25 DIAGNOSIS — I129 Hypertensive chronic kidney disease with stage 1 through stage 4 chronic kidney disease, or unspecified chronic kidney disease: Secondary | ICD-10-CM

## 2023-09-25 DIAGNOSIS — Z7189 Other specified counseling: Secondary | ICD-10-CM

## 2023-09-25 DIAGNOSIS — L89224 Pressure ulcer of left hip, stage 4: Secondary | ICD-10-CM | POA: Diagnosis not present

## 2023-09-25 DIAGNOSIS — N189 Chronic kidney disease, unspecified: Secondary | ICD-10-CM

## 2023-09-25 DIAGNOSIS — S81802D Unspecified open wound, left lower leg, subsequent encounter: Secondary | ICD-10-CM | POA: Diagnosis not present

## 2023-09-25 DIAGNOSIS — E1122 Type 2 diabetes mellitus with diabetic chronic kidney disease: Secondary | ICD-10-CM

## 2023-09-25 HISTORY — DX: Orthostatic hypotension: I95.1

## 2023-09-25 HISTORY — PX: INCISION AND DRAINAGE OF WOUND: SHX1803

## 2023-09-25 LAB — CBC
HCT: 26.3 % — ABNORMAL LOW (ref 36.0–46.0)
Hemoglobin: 7.7 g/dL — ABNORMAL LOW (ref 12.0–15.0)
MCH: 26.5 pg (ref 26.0–34.0)
MCHC: 29.3 g/dL — ABNORMAL LOW (ref 30.0–36.0)
MCV: 90.4 fL (ref 80.0–100.0)
Platelets: 500 10*3/uL — ABNORMAL HIGH (ref 150–400)
RBC: 2.91 MIL/uL — ABNORMAL LOW (ref 3.87–5.11)
RDW: 16.7 % — ABNORMAL HIGH (ref 11.5–15.5)
WBC: 5.6 10*3/uL (ref 4.0–10.5)
nRBC: 0 % (ref 0.0–0.2)

## 2023-09-25 LAB — BASIC METABOLIC PANEL
Anion gap: 6 (ref 5–15)
BUN: 10 mg/dL (ref 8–23)
CO2: 23 mmol/L (ref 22–32)
Calcium: 8.6 mg/dL — ABNORMAL LOW (ref 8.9–10.3)
Chloride: 115 mmol/L — ABNORMAL HIGH (ref 98–111)
Creatinine, Ser: 0.99 mg/dL (ref 0.44–1.00)
GFR, Estimated: 59 mL/min — ABNORMAL LOW (ref >60)
Glucose, Bld: 117 mg/dL — ABNORMAL HIGH (ref 70–99)
Potassium: 3.5 mmol/L (ref 3.5–5.1)
Sodium: 144 mmol/L (ref 135–145)

## 2023-09-25 LAB — GLUCOSE, CAPILLARY
Glucose-Capillary: 101 mg/dL — ABNORMAL HIGH (ref 70–99)
Glucose-Capillary: 120 mg/dL — ABNORMAL HIGH (ref 70–99)
Glucose-Capillary: 96 mg/dL (ref 70–99)
Glucose-Capillary: 169 mg/dL — ABNORMAL HIGH (ref 70–99)

## 2023-09-25 SURGERY — IRRIGATION AND DEBRIDEMENT WOUND
Anesthesia: General | Site: Thigh | Laterality: Left

## 2023-09-25 MED ORDER — PROPOFOL 10 MG/ML IV BOLUS
INTRAVENOUS | Status: AC
  Filled 2023-09-25: qty 20

## 2023-09-25 MED ORDER — LACTATED RINGERS IV SOLN: INTRAVENOUS | Status: DC

## 2023-09-25 MED ORDER — OXYCODONE HCL 5 MG/5ML PO SOLN: 5.0000 mg | Freq: Once | ORAL | Status: DC | PRN

## 2023-09-25 MED ORDER — ONDANSETRON HCL 4 MG/2ML IJ SOLN
INTRAMUSCULAR | Status: DC | PRN
  Administered 2023-09-25: 4 mg via INTRAVENOUS

## 2023-09-25 MED ORDER — DEXAMETHASONE SODIUM PHOSPHATE 10 MG/ML IJ SOLN
INTRAMUSCULAR | Status: AC
  Filled 2023-09-25: qty 1

## 2023-09-25 MED ORDER — VASHE WOUND IRRIGATION OPTIME
TOPICAL | Status: DC | PRN
  Administered 2023-09-25: 34 [oz_av]

## 2023-09-25 MED ORDER — FENTANYL CITRATE (PF) 250 MCG/5ML IJ SOLN
INTRAMUSCULAR | Status: AC
  Filled 2023-09-25: qty 5

## 2023-09-25 MED ORDER — PROPOFOL 10 MG/ML IV BOLUS
INTRAVENOUS | Status: DC | PRN
  Administered 2023-09-25: 100 mg via INTRAVENOUS

## 2023-09-25 MED ORDER — ONDANSETRON HCL 4 MG/2ML IJ SOLN: 4.0000 mg | Freq: Once | INTRAMUSCULAR | Status: DC | PRN

## 2023-09-25 MED ORDER — JUVEN PO PACK
1.0000 | PACK | Freq: Two times a day (BID) | ORAL | Status: DC
  Administered 2023-09-26 – 2023-11-01 (×65): 1 via ORAL
  Filled 2023-09-25 (×67): qty 1

## 2023-09-25 MED ORDER — CHLORHEXIDINE GLUCONATE 0.12 % MT SOLN
OROMUCOSAL | Status: AC
  Administered 2023-09-25: 15 mL via OROMUCOSAL
  Filled 2023-09-25: qty 15

## 2023-09-25 MED ORDER — FENTANYL CITRATE (PF) 100 MCG/2ML IJ SOLN: 25.0000 ug | INTRAMUSCULAR | Status: DC | PRN

## 2023-09-25 MED ORDER — 0.9 % SODIUM CHLORIDE (POUR BTL) OPTIME
TOPICAL | Status: DC | PRN
  Administered 2023-09-25: 1000 mL

## 2023-09-25 MED ORDER — DEXAMETHASONE SODIUM PHOSPHATE 10 MG/ML IJ SOLN
INTRAMUSCULAR | Status: DC | PRN
  Administered 2023-09-25: 5 mg via INTRAVENOUS

## 2023-09-25 MED ORDER — CHLORHEXIDINE GLUCONATE 0.12 % MT SOLN: 15.0000 mL | Freq: Once | OROMUCOSAL | Status: AC

## 2023-09-25 MED ORDER — ORAL CARE MOUTH RINSE
15.0000 mL | Freq: Once | OROMUCOSAL | Status: AC
Start: 2023-09-25 — End: 2023-09-25

## 2023-09-25 MED ORDER — AMISULPRIDE (ANTIEMETIC) 5 MG/2ML IV SOLN: 10.0000 mg | Freq: Once | INTRAVENOUS | Status: DC | PRN

## 2023-09-25 MED ORDER — FENTANYL CITRATE (PF) 250 MCG/5ML IJ SOLN
INTRAMUSCULAR | Status: DC | PRN
  Administered 2023-09-25 (×4): 25 ug via INTRAVENOUS

## 2023-09-25 MED ORDER — OXYCODONE HCL 5 MG PO TABS: 5.0000 mg | ORAL_TABLET | Freq: Once | ORAL | Status: DC | PRN

## 2023-09-25 MED ORDER — ONDANSETRON HCL 4 MG/2ML IJ SOLN
INTRAMUSCULAR | Status: AC
  Filled 2023-09-25: qty 2

## 2023-09-25 MED ORDER — LIDOCAINE-EPINEPHRINE 1 %-1:100000 IJ SOLN
INTRAMUSCULAR | Status: AC
  Filled 2023-09-25: qty 1

## 2023-09-25 MED ORDER — LIDOCAINE 2% (20 MG/ML) 5 ML SYRINGE
INTRAMUSCULAR | Status: DC | PRN
  Administered 2023-09-25: 40 mg via INTRAVENOUS

## 2023-09-25 SURGICAL SUPPLY — 58 items
APL SKNCLS STERI-STRIP NONHPOA (GAUZE/BANDAGES/DRESSINGS) ×1
APL SWBSTK 6 STRL LF DISP (MISCELLANEOUS)
APPLICATOR COTTON TIP 6 STRL (MISCELLANEOUS) IMPLANT
APPLICATOR COTTON TIP 6IN STRL (MISCELLANEOUS) IMPLANT
BAG COUNTER SPONGE SURGICOUNT (BAG) ×2 IMPLANT
BAG DECANTER FOR FLEXI CONT (MISCELLANEOUS) IMPLANT
BAG SPNG CNTER NS LX DISP (BAG) ×1
BENZOIN TINCTURE PRP APPL 2/3 (GAUZE/BANDAGES/DRESSINGS) ×2 IMPLANT
CANISTER SUCT 3000ML PPV (MISCELLANEOUS) ×2 IMPLANT
CLEANSER WND VASHE INSTL 34OZ (WOUND CARE) IMPLANT
CNTNR URN SCR LID CUP LEK RST (MISCELLANEOUS) IMPLANT
CONT SPEC 4OZ STRL OR WHT (MISCELLANEOUS)
COVER SURGICAL LIGHT HANDLE (MISCELLANEOUS) ×2 IMPLANT
DRAIN CHANNEL 19F RND (DRAIN) IMPLANT
DRAIN JP 10F RND SILICONE (MISCELLANEOUS) IMPLANT
DRAPE HALF SHEET 40X57 (DRAPES) IMPLANT
DRAPE IMP U-DRAPE 54X76 (DRAPES) ×2 IMPLANT
DRAPE INCISE IOBAN 66X45 STRL (DRAPES) IMPLANT
DRAPE LAPAROSCOPIC ABDOMINAL (DRAPES) IMPLANT
DRAPE LAPAROTOMY 100X72 PEDS (DRAPES) ×2 IMPLANT
DRSG ADAPTIC 3X8 NADH LF (GAUZE/BANDAGES/DRESSINGS) IMPLANT
DRSG CALCIUM ALGINATE 4X4 (GAUZE/BANDAGES/DRESSINGS) IMPLANT
DRSG CUTIMED SORBACT 7X9 (GAUZE/BANDAGES/DRESSINGS) IMPLANT
DRSG VAC GRANUFOAM LG (GAUZE/BANDAGES/DRESSINGS) IMPLANT
DRSG VAC GRANUFOAM MED (GAUZE/BANDAGES/DRESSINGS) IMPLANT
DRSG VAC GRANUFOAM SM (GAUZE/BANDAGES/DRESSINGS) IMPLANT
ELECT CAUTERY BLADE 6.4 (BLADE) IMPLANT
ELECT REM PT RETURN 9FT ADLT (ELECTROSURGICAL) ×1
ELECTRODE REM PT RTRN 9FT ADLT (ELECTROSURGICAL) ×2 IMPLANT
GAUZE PAD ABD 8X10 STRL (GAUZE/BANDAGES/DRESSINGS) IMPLANT
GAUZE SPONGE 4X4 12PLY STRL (GAUZE/BANDAGES/DRESSINGS) IMPLANT
GLOVE BIO SURGEON STRL SZ8 (GLOVE) ×2 IMPLANT
GLOVE BIOGEL PI IND STRL 8 (GLOVE) ×2 IMPLANT
GOWN STRL REUS W/ TWL LRG LVL3 (GOWN DISPOSABLE) ×6 IMPLANT
GOWN STRL REUS W/TWL LRG LVL3 (GOWN DISPOSABLE) ×3
GOWN STRL REUS W/TWL XL LVL3 (GOWN DISPOSABLE) ×2 IMPLANT
GRAFT MYRIAD 3 LAYER 5X5 (Graft) IMPLANT
KIT BASIN OR (CUSTOM PROCEDURE TRAY) ×2 IMPLANT
KIT TURNOVER KIT B (KITS) ×2 IMPLANT
NDL HYPO 25GX1X1/2 BEV (NEEDLE) ×2 IMPLANT
NEEDLE HYPO 25GX1X1/2 BEV (NEEDLE) ×1 IMPLANT
NS IRRIG 1000ML POUR BTL (IV SOLUTION) ×2 IMPLANT
PACK GENERAL/GYN (CUSTOM PROCEDURE TRAY) ×2 IMPLANT
PACK UNIVERSAL I (CUSTOM PROCEDURE TRAY) ×2 IMPLANT
PAD ARMBOARD 7.5X6 YLW CONV (MISCELLANEOUS) ×4 IMPLANT
STAPLER VISISTAT 35W (STAPLE) ×2 IMPLANT
SURGILUBE 2OZ TUBE FLIPTOP (MISCELLANEOUS) IMPLANT
SUT MNCRL AB 3-0 PS2 27 (SUTURE) IMPLANT
SUT MNCRL AB 4-0 PS2 18 (SUTURE) IMPLANT
SUT MON AB 2-0 CT1 36 (SUTURE) IMPLANT
SUT MON AB 5-0 PS2 18 (SUTURE) IMPLANT
SUT VIC AB 5-0 PS2 18 (SUTURE) IMPLANT
SUT VICRYL 3 0 (SUTURE) IMPLANT
SWAB COLLECTION DEVICE MRSA (MISCELLANEOUS) IMPLANT
SWAB CULTURE ESWAB REG 1ML (MISCELLANEOUS) IMPLANT
SYR CONTROL 10ML LL (SYRINGE) ×2 IMPLANT
TOWEL GREEN STERILE (TOWEL DISPOSABLE) ×2 IMPLANT
UNDERPAD 30X36 HEAVY ABSORB (UNDERPADS AND DIAPERS) ×2 IMPLANT

## 2023-09-25 NOTE — Interval H&P Note (Signed)
History and Physical Interval Note: No change in exam or indication for surgery. Pt is currently oriented to person and place and was able to verbalize what we will be doing in surgery. Her wound needs to be evaluated and debrided. All questions answered. Will proceed at her request and the request of her primary care team.  09/25/2023 3:32 PM  Elizabeth Schwartz  has presented today for surgery, with the diagnosis of LEFT THIGH WOUND.  The various methods of treatment have been discussed with the patient and family. After consideration of risks, benefits and other options for treatment, the patient has consented to  Procedure(s): IRRIGATION AND DEBRIDEMENT  THIGH WOUND (Left) as a surgical intervention.  The patient's history has been reviewed, patient examined, no change in status, stable for surgery.  I have reviewed the patient's chart and labs.  Questions were answered to the patient's satisfaction.     Santiago Glad

## 2023-09-25 NOTE — Op Note (Signed)
DATE OF OPERATION: 09/25/2023  LOCATION: [facility] Main operating Room  PREOPERATIVE DIAGNOSIS: Necrotic medial thigh wound, left leg  POSTOPERATIVE DIAGNOSIS: Same  PROCEDURE: Examination of wound under anesthesia, debridement of wound, placement of myriad tissue matrix.  SURGEON: Loren Racer, MD  ASSISTANT: Caroline More  EBL: 15 cc  CONDITION: Stable  COMPLICATIONS: None  INDICATION: The patient, Elizabeth Schwartz, is a 77 y.o. female born on September 22, 1946, is here for treatment of a medial thigh wound the etiology of which remains somewhat unclear.  Patient may have had a kitchen appliance fall on her leg with a burn or pressure wound.  Over a period of approximately a month the wound continued to worsen until she had a large necrotic wound that extended down to the femur.  I was consulted to help with management of the soft tissue defect.Marland Kitchen   PROCEDURE DETAILS:  The patient was seen prior to surgery and marked.   The patient was taken to the operating room and given a general anesthetic. A standard time out was performed and all information was confirmed by those in the room. SCDs were placed.   The left thigh was prepped and draped in usual sterile manner.  On inspection of the wound there was a large amount of frankly necrotic tissue within the wound.  I debrided this with scissors sharply and removed approximately 50 mL of necrotic tissue.  This was all sent to the laboratory for culture and sensitivity.  After debridement of the wound there was a 4 cm segment of the anterior wall of the femoral artery exposed.  I elected to place a portion of myriad tissue replacement matrix over the exposed artery.  A 5 x 5 cm portion of myriad was doubled placed over the exposed vessel and sutured in place with interrupted 3-0 Monocryl sutures.  A portion of Sorbact was then sutured over the myriad and artery.  The wound was irrigated first with saline and then with Vashe.  Was no bleeding noted within the wound.   The Sorbact and matrix were covered with surgical lube and the wound was packed with Vashe soaked 4 x 4's.  This was held in place with a dry 4 x 4 and wrapped with Kerlix and Ace wrap.  Will plan to change the dressing in 48 hours at the patient's bedside.  The total length of the wound after preparation was 12 cm wide by 8 cm long by 4.5 cm deep.  The patient tolerated the procedure well and was awakened from anesthesia without incident.  She was transferred to the recovery room in good condition.  All instrument needle and sponge counts reported as correct and there were no complications noted during surgery. The patient was allowed to wake up and taken to recovery room in stable condition at the end of the case. The family was notified at the end of the case.   The advanced practice practitioner (APP) assisted throughout the case.  The APP was essential in retraction and counter traction when needed to make the case progress smoothly.  This retraction and assistance made it possible to see the tissue plans for the procedure.  The assistance was needed for blood control, tissue re-approximation and assisted with closure of the incision site.

## 2023-09-25 NOTE — Plan of Care (Signed)
FMTS Brief Progress Note  S: Evaluated with Dr. Laroy Apple at bedside after wound debridement today. Pt states that the surgery went well, she is in no pain currently and has no concerns or questions.    O: BP 132/66 (BP Location: Left Arm)   Pulse 91   Temp 98.2 F (36.8 C) (Oral)   Resp 19   Ht 5\' 5"  (1.651 m)   Wt 74.8 kg   SpO2 94%   BMI 27.46 kg/m   Surgical site at LLE covered with gauze and bandage that is clean/dry/intact. No LLE TTP.    A/P: Necrotizing myositis -S/p surgical debridement today -No complications -PO linezolid 600mg  BID -IV zosyn per pharmacy -Tylenol 650mg  q6h for pain -Rest of plan per day team  - Orders reviewed. Labs for AM ordered, which was adjusted as needed.  Can look at pain medication regimen and reassess if patient begins to have pain in her leg  Para March, DO 09/25/2023, 10:15 PM PGY-1, [facility] Night Resident  Please page [phone] with questions.

## 2023-09-25 NOTE — Progress Notes (Signed)
This Clinical research associate together with Consulting civil engineer tried to get an informed consent from patient; Pt is confused saying "they're gonna remove a $50 bill on my right ankle"; she does not know the date,place & time. This nurse together with Charge RN believed she's not alert and oriented x4 or appropriate to sign informed consent.Dr Ladona Ridgel (surgeon) called and informed.

## 2023-09-25 NOTE — Anesthesia Procedure Notes (Signed)
Procedure Name: LMA Insertion Date/Time: 09/25/2023 3:58 PM  Performed by: Eulah Pont, CRNAPre-anesthesia Checklist: Patient identified, Emergency Drugs available, Suction available and Patient being monitored Patient Re-evaluated:Patient Re-evaluated prior to induction Oxygen Delivery Method: Circle System Utilized Preoxygenation: Pre-oxygenation with 100% oxygen Induction Type: IV induction Ventilation: Mask ventilation without difficulty LMA: LMA inserted LMA Size: 4.0 Number of attempts: 1 Airway Equipment and Method: Bite block Placement Confirmation: positive ETCO2 Tube secured with: Tape Dental Injury: Teeth and Oropharynx as per pre-operative assessment

## 2023-09-25 NOTE — Assessment & Plan Note (Addendum)
Following injury in September 2024. Pain well controlled. Pending irrigation and debridement today. - Plastic surgery consulted, appreciate recs - f/u wound care recommendations post-op - P.o. linezolid 600 mg twice daily - IV Zosyn per pharmacy --Reconsult ID post-op for ongoing ABX planning - Scheduled tylenol 650mg  q6h for pain - TOC consult, anticipated SNF at discharge

## 2023-09-25 NOTE — Progress Notes (Signed)
Daily Progress Note Intern Pager: [phone]  Patient name: Elizabeth Schwartz Medical record number: [id number] Date of birth: 10-Dec-1945 Age: 77 y.o. Gender: female  Primary Care Provider: Storm Frisk, MD Consultants: plastic surgery, ID, ortho Code Status: Full  Pt Overview and Major Events to Date:  11/3: admitted to FMTS  Assessment and Plan: Elizabeth Schwartz is a 77 year old female admitted for L thigh wound after crush injury with imaging concerning for necrotizing myositis and early osteomyelitis. Surgical treatment was delayed due to concern that pt lacked capacity to consent, pt was later deemed to have capacity per psychiatry assessment. Going to OR for I&D today.  Pertinent PMH/PSH includes HLD, schizophrenia, COPD, osteoporosis Assessment & Plan Necrotizing myositis Following injury in September 2024. Pain well controlled. Pending irrigation and debridement today. - Plastic surgery consulted, appreciate recs - f/u wound care recommendations post-op - P.o. linezolid 600 mg twice daily - IV Zosyn per pharmacy --Reconsult ID post-op for ongoing ABX planning - Scheduled tylenol 650mg  q6h for pain - TOC consult, anticipated SNF at discharge Osteomyelitis of left femur (HCC) Early acute osteomyelitis found on CT. Treatment plan as above. Diabetes mellitus without complication (HCC) A1c 6.1 in Sept 24. Blood glucose has been well controlled and stable during hospital stay.  Will continue to monitor. - CBG monitoring AC/HS - Hold home metformin Dementia (HCC) Alzheimer's dementia. - Memantine 5 mg twice daily, Aricept 10 mg at bedtime - Daily precautions - Fall precautions Anemia Normocytic anemia.  Hemoglobin has been stable between 7-8.  Suspect component of anemia of chronic disease.  No evidence of active bleeding. - AM CBC Orthostatic hypotension Noted during OT on 11/7. Abdominal binder added 11/8. Most recent orthostatic vitals 152/68 lying, 132/55 sitting,  115/52 standing. --PT/OT eval and treat --continue to monitor Goals of care, counseling/discussion Pt requested DNR status on admission, however had questionable capacity to make medical decisions and was made full code by default. Evaluated by psychiatry and deemed to have capacity to make a decision about surgery, specifically can make task specific medical decisions.  --continue to discuss code status with patient and assess for understanding --pt will likely need legal guardian appointed, TOC following   Chronic and Stable Issues: HLD-Have not started home Zetia 10 mg daily, fenofibrate 160 mg daily (unclear if taking) Schizophrenia-continue home clozapine 200 mg at bedtime  COPD-Continue Symbicort 2 puffs twice daily, albuterol as needed Osteoporosis-Have not ordered alendronate 70 mg once a week on Wednesdays per chart review  FEN/GI: NPO for OR PPx: lovenox (held for OR) Dispo: SNF pending clinical improvement  Subjective:  Pt states she feels fine today. States her pain is "there." Seems to understand plan to have surgery today. No questions or concerns.  Objective: Temp:  [97.9 F (36.6 C)-98.5 F (36.9 C)] 98.2 F (36.8 C) (11/11 0746) Pulse Rate:  [94-102] 94 (11/11 0746) Resp:  [18] 18 (11/11 0452) BP: (124-145)/(56-64) 124/56 (11/11 0746) SpO2:  [94 %-99 %] 99 % (11/11 4401) Physical Exam: General: laying in bed, awake and alert, in NAD Cardiovascular: RRR, normal S1/S2 Respiratory: CTAB, normal WOB on RA Abdomen: normoactive bowel sounds, soft, nontender, nondistended Extremities: L thigh covered with clean, dry dressing, trace edema to L shin, no edema to RLE  Laboratory: Most recent CBC Lab Results  Component Value Date   WBC 5.6 09/25/2023   HGB 7.7 (L) 09/25/2023   HCT 26.3 (L) 09/25/2023   MCV 90.4 09/25/2023   PLT 500 (H) 09/25/2023  Most recent BMP    Latest Ref Rng & Units 09/25/2023    6:56 AM  BMP  Glucose 70 - 99 mg/dL 161   BUN 8 - 23  mg/dL 10   Creatinine 0.96 - 1.00 mg/dL 0.45   Sodium 409 - 811 mmol/L 144   Potassium 3.5 - 5.1 mmol/L 3.5   Chloride 98 - 111 mmol/L 115   CO2 22 - 32 mmol/L 23   Calcium 8.9 - 10.3 mg/dL 8.6     Reagen Haberman, Tacey Ruiz, MD 09/25/2023, 8:22 AM  PGY-1, [facility] FPTS Intern pager: [phone], text pages welcome Secure chat group [facility] Teaching Service

## 2023-09-25 NOTE — Assessment & Plan Note (Signed)
Normocytic anemia.  Hemoglobin has been stable between 7-8.  Suspect component of anemia of chronic disease.  No evidence of active bleeding. - AM CBC

## 2023-09-25 NOTE — Progress Notes (Signed)
Seeing patient after slipped out of chair earlier this afternoon She is awake alert and denies any pain.  No signs of contusions  Nonfocal neurologic exam.   Surgery wished [place] to review her understanding and ability to consent for operation today  On entering her room she asked if we were ready to do her surgery.  I explained I was part of the medical team.  I asked what she thought would happen if she did not have surgery on her leg.  She replied that it hadn't healed in month and she did not think it would. I asked if she thought surgery would help her leg and she said she hoped so I let her know that there were risks with surgery including infection and bleeding and even a small risk of death and asked if she wanted to go ahead with surgery and she said yes.  Patient was able to focus on my questions and reply reasonably.  Intermittently she would describe her wound or what might happen in terms of animals or being animals being skinned that in my opinion is part of her underlying schizophrenia but does not interfere with her ability to make informed decisions about the risk and benefits of surgery which is in keeping with her prior psychiatry consult.  I witnessed her sign the consent for surgery.  As I was typing this note she thanked me by name as she was wheeled past.

## 2023-09-25 NOTE — Progress Notes (Signed)
This nurse heard "help, I've fallen and can't get up". I entered room to see patient laying on her left side, on the ground, in front of her recliner. Patient chair alarm off and recliner wheels unlocked. Patient stated that she was trying to get to the restroom. Patient denied hitting her head. Randy RN assisted getting the patient up to chair and then back to bed. Vital signs and skin checked. Bed alarm on, bed in low position, fall mat down, and call bell within reach. Patient instructed to use call bell for assistance.  Melony Overly, RN

## 2023-09-25 NOTE — Progress Notes (Addendum)
Nutrition Follow-up  DOCUMENTATION CODES:   Not applicable  INTERVENTION:   -Resume regular diet when able, continue to offer protein focused snacks bid.  -Provide -1 packet Juven BID, each packet provides 95 calories, 2.5 grams of protein (collagen), and 9.8 grams of carbohydrate (3 grams sugar); also contains 7 grams of L-arginine and L-glutamine, 300 mg vitamin C, 15 mg vitamin E, 1.2 mcg vitamin B-12, 9.5 mg zinc, 200 mg calcium, and 1.5 g  Calcium Beta-hydroxy-Beta-methylbutyrate to support wound healing.  -Continue MVI/Minerals-1 Tab daily, vitamin C 250 mg bid for micronutrient support to aide in wound healing.    NUTRITION DIAGNOSIS:   Increased nutrient needs related to wound healing as evidenced by estimated needs. -addressing with protein focused snacks, Juven bid.   GOAL:   Patient will meet greater than or equal to 90% of their needs -progressing with meals and snacks   MONITOR:   PO intake, Weight trends, Skin, Labs  REASON FOR ASSESSMENT:   Follow up  ASSESSMENT:   77 y/o female presented with left thigh wound after a crush injury last month. Imaging showed evidenced of necrotizing myositis and acute early osteomyelitis.  Recent hospitalization on 9/25-10/4/24  after a stove fell on her leg causing pressure injury of the left thigh.  She was found by neighbors and had rhabdo. Underwent SNF discharge after hospital stay and was recently discharged.  PMH: DM2, HTN, HLD, osteoporosis, COPD, anemia, schizophrenia, dementia.  Patient today was more agreeable to engage in conversation and was smiling compared to initial visit. Discussed Juven and rationale, today patient was open to try.  Meal intakes recorded average 97% xx 8 meals.  Currently NPO for OR for I & D for necrotizing myositis. No updated weight since admit. Edema 1+ LLE.  Medications reviewed and include vitamin C 250 mg bid, novolog SS 3x daily with meals, MVI/Minerals-1 Tab daily.  Labs: CBG  [id number]    Diet Order:   Diet Order             Diet NPO time specified  Diet effective midnight                   EDUCATION NEEDS:   Not appropriate for education at this time  Skin:  Skin Assessment: Skin Integrity Issues: Skin Integrity Issues:: Other (Comment) Other: non-pressure wound to left anterior thigh, blanching/redness to right proximal hip, abrasion to left great toe  Last BM:  prior to admission  Height:   Ht Readings from Last 1 Encounters:  09/17/23 5\' 5"  (1.651 m)    Weight:   Wt Readings from Last 1 Encounters:  09/17/23 74.8 kg    Ideal Body Weight:  59.1 kg  BMI:  Body mass index is 27.46 kg/m.  Estimated Nutritional Needs:   Kcal:  1500-1800 kcal/day  Protein:  77-89 gm/day  Fluid:  1500-1800 mL/day    Alvino Chapel, RDLD Clinical Dietitian See AMION for contact information

## 2023-09-25 NOTE — Assessment & Plan Note (Signed)
Noted during OT on 11/7. Abdominal binder added 11/8. Most recent orthostatic vitals 152/68 lying, 132/55 sitting, 115/52 standing. --PT/OT eval and treat --continue to monitor

## 2023-09-25 NOTE — Assessment & Plan Note (Signed)
Early acute osteomyelitis found on CT. Treatment plan as above.

## 2023-09-25 NOTE — Plan of Care (Signed)
FMTS Interim Progress Note  Paged by nursing about patient recently found on the ground. Patient told nursing she slid from the recliner chair bottom first and did not hit her head.   S: Patient states she did not hit her head, maybe her face, but then repeated she did not hit her head. No current headache. No vision changes.   O: BP (!) 116/57 (BP Location: Left Arm)   Pulse 95   Temp 98.4 F (36.9 C) (Oral)   Resp 18   Ht 5\' 5"  (1.651 m)   Wt 74.8 kg   SpO2 97%   BMI 27.46 kg/m   General: Well-appearing. Resting comfortably in room. CV: Warm and well-perfused. Pulm: Breathing comfortably on room air. No increased WOB. Abd: Soft, non-tender, non-distended. Neuro: Atraumatic head. Normal UE strength and sensation. Able to move both feet.  CN2: no vision changes CN3,4,6: PERRLA. EOMI CN5: Sensation intact BL CN7: Facial expressions symmetric CN8: Hearing intact BL CN10: regular speech CN11: turns head against resistance CN12: tongue midline  A/P: Neuro exam overall reassuring. No imaging needed at this time. Continue treatment plan as previously indicated by day team.   Ivery Quale, MD 09/25/2023, 2:48 PM PGY-1, [facility] Family Medicine Service pager [phone]

## 2023-09-25 NOTE — Assessment & Plan Note (Signed)
Alzheimer's dementia. - Memantine 5 mg twice daily, Aricept 10 mg at bedtime - Daily precautions - Fall precautions

## 2023-09-25 NOTE — Transfer of Care (Signed)
Immediate Anesthesia Transfer of Care Note  Patient: Elizabeth Schwartz  Procedure(s) Performed: IRRIGATION AND DEBRIDEMENT  THIGH WOUND (Left: Thigh)  Patient Location: PACU  Anesthesia Type:General  Level of Consciousness: awake and drowsy  Airway & Oxygen Therapy: Patient Spontanous Breathing and Patient connected to face mask oxygen  Post-op Assessment: Report given to RN and Post -op Vital signs reviewed and stable  Post vital signs: Reviewed and stable  Last Vitals:  Vitals Value Taken Time  BP 130/60 09/25/23 1707  Temp    Pulse 96 09/25/23 1709  Resp 14 09/25/23 1709  SpO2 100 % 09/25/23 1709  Vitals shown include unfiled device data.  Last Pain:  Vitals:   09/25/23 1520  TempSrc: Oral  PainSc: 0-No pain      Patients Stated Pain Goal: 2 (09/19/23 1600)  Complications: No notable events documented.

## 2023-09-25 NOTE — Progress Notes (Signed)
Pt fell on floor unwitnessed by her recliner chair; she claimed "she slid down by the chair bottom first and just try to lie down"; nurse manager & this writer placed her back on the chair; vital signs stable;no head involvement;  pt has baseline memory impairment.Pt placed back to bed a few minutes later.Charge RN informed.

## 2023-09-25 NOTE — Progress Notes (Addendum)
Pt trying to remove dressing on left thigh when this writer entered her room; pt educated not to mess with it because she's for surgery this afternoon and she somehow complied.

## 2023-09-25 NOTE — Assessment & Plan Note (Addendum)
A1c 6.1 in Sept 24. Blood glucose has been well controlled and stable during hospital stay.  Will continue to monitor. - CBG monitoring AC/HS - Hold home metformin

## 2023-09-25 NOTE — Plan of Care (Signed)
  Problem: Metabolic: Goal: Ability to maintain appropriate glucose levels will improve Outcome: Progressing   Problem: Nutritional: Goal: Maintenance of adequate nutrition will improve Outcome: Progressing   Problem: Skin Integrity: Goal: Risk for impaired skin integrity will decrease Outcome: Progressing   

## 2023-09-25 NOTE — Assessment & Plan Note (Addendum)
Pt requested DNR status on admission, however had questionable capacity to make medical decisions and was made full code by default. Evaluated by psychiatry and deemed to have capacity to make a decision about surgery, specifically can make task specific medical decisions.  --continue to discuss code status with patient and assess for understanding --pt will likely need legal guardian appointed, TOC following

## 2023-09-26 ENCOUNTER — Encounter (HOSPITAL_COMMUNITY): Payer: Self-pay | Admitting: Plastic Surgery

## 2023-09-26 DIAGNOSIS — L89224 Pressure ulcer of left hip, stage 4: Secondary | ICD-10-CM | POA: Diagnosis not present

## 2023-09-26 LAB — GLUCOSE, CAPILLARY
Glucose-Capillary: 115 mg/dL — ABNORMAL HIGH (ref 70–99)
Glucose-Capillary: 97 mg/dL (ref 70–99)
Glucose-Capillary: 99 mg/dL (ref 70–99)
Glucose-Capillary: 93 mg/dL (ref 70–99)

## 2023-09-26 MED ORDER — PROPOFOL 1000 MG/100ML IV EMUL
INTRAVENOUS | Status: AC
  Filled 2023-09-26: qty 500

## 2023-09-26 MED ORDER — ENOXAPARIN SODIUM 40 MG/0.4ML IJ SOSY
40.0000 mg | PREFILLED_SYRINGE | INTRAMUSCULAR | Status: AC
  Administered 2023-09-26 – 2023-10-03 (×8): 40 mg via SUBCUTANEOUS
  Filled 2023-09-26 (×8): qty 0.4

## 2023-09-26 MED ORDER — PHENYLEPHRINE HCL-NACL 20-0.9 MG/250ML-% IV SOLN
INTRAVENOUS | Status: AC
  Filled 2023-09-26: qty 1000

## 2023-09-26 NOTE — Assessment & Plan Note (Addendum)
Following crush injury in September 2024. S/p I&D. Surgical wound cx gram stain with rare GPC in pairs. Pain appears to be well controlled. - Plastic surgery following, appreciate recommendations  -dressing change tomorrow --F/u surgical wound cultures and susceptibilities --pending updated ID recommendations - P.o. linezolid 600 mg twice daily - IV Zosyn per pharmacy - Scheduled tylenol 650mg  q6h for pain - TOC consult, anticipated SNF at discharge

## 2023-09-26 NOTE — Assessment & Plan Note (Signed)
Pt requested DNR status on admission, however had questionable capacity to make medical decisions and was made full code by default. Evaluated by psychiatry and deemed to have capacity to make a decision about surgery, specifically can make task specific medical decisions.  --continue to discuss code status with patient and assess for understanding --pt will likely need legal guardian appointed, TOC following

## 2023-09-26 NOTE — Assessment & Plan Note (Signed)
Normocytic anemia.  Hemoglobin has been stable between 7-8.  Suspect component of anemia of chronic disease.  No evidence of active bleeding. - AM CBC

## 2023-09-26 NOTE — Progress Notes (Addendum)
[facility] Infectious Diseases Follow Up Note  Patient Identification: Patient Name: Elizabeth Schwartz MRN: [id number] Admit Date: 09/17/2023 12:06 PM Age: 77 y.o.Today's Date: 09/26/2023  Reason for Visit: soft tissue infection   Principal Problem:   Pressure injury of left thigh, stage 4 (HCC) Active Problems:   Anemia   Long term current use of clozapine   Myositis of left thigh   Osteomyelitis of left femur (HCC)   Necrotizing myositis   Diabetes mellitus without complication (HCC)   Dementia (HCC)   Orthostatic hypotension   Goals of care, counseling/discussion   Antibiotics: Linezolid 11/4- Zosyn 11/3- Total days of antibiotics 20   Lines/Hardwares:   Interval Events: Remains afebrile, status post OR with plastic surgery yesterday, no new update in cultures, Gram stain with GPC in pairs   Assessment 77 year old female with h/o DM2,  dementia who presented to ED on 7/3 for worsening wound in the left inner thigh with deep, purulent and foul-smelling drainage. Admitted with   # Necrotizing myositis of medial left thigh/deep soft tissue wound # Possible acute early osteomyelitis of the mid left femur - Recently admitted 9/25-10/4 when she presented with fall, neighbors found her pinned to the ground with stove on top of her, found to have compressive injury to left thigh/rhabdomyolysis. Seen by Ortho and neuro last admission with improvement of swelling and movement on Left lower extremity prior to discharge.  11/11 s/p debridement of wound, placement of myriad tissue matrix . OR cx GPC in pairs  - Plastic surgery following, next wound dressing tomorrow  Recommendations - continue linezolid and zosyn pending maturation of cultures  - Post op care per surgery - Monitor CBC, CMP and CPK - Following surgical plans   Rest of the management as per the primary team. Thank you for the consult. Please page with pertinent  questions or concerns.  ______________________________________________________________________ Subjective patient seen at bedside, she was sleeping peacefully, I did not wake her up.    Vitals BP 126/64 (BP Location: Left Arm)   Pulse 83   Temp 97.9 F (36.6 C) (Oral)   Resp 18   Ht 5\' 5"  (1.651 m)   Wt 74.8 kg   SpO2 100%   BMI 27.46 kg/m   Pertinent Microbiology Results for orders placed or performed during the hospital encounter of 09/17/23  Resp panel by RT-PCR (RSV, Flu A&B, Covid) Anterior Nasal Swab     Status: None   Collection Time: 09/17/23 12:51 PM   Specimen: Anterior Nasal Swab  Result Value Ref Range Status   SARS Coronavirus 2 by RT PCR NEGATIVE NEGATIVE Final   Influenza A by PCR NEGATIVE NEGATIVE Final   Influenza B by PCR NEGATIVE NEGATIVE Final    Comment: (NOTE) The Xpert Xpress SARS-CoV-2/FLU/RSV plus assay is intended as an aid in the diagnosis of influenza from Nasopharyngeal swab specimens and should not be used as a sole basis for treatment. Nasal washings and aspirates are unacceptable for Xpert Xpress SARS-CoV-2/FLU/RSV testing.  Fact Sheet for Patients: BloggerCourse.com  Fact Sheet for Healthcare Providers: SeriousBroker.it  This test is not yet approved or cleared by the [place] FDA and has been authorized for detection and/or diagnosis of SARS-CoV-2 by FDA under an Emergency Use Authorization (EUA). This EUA will remain in effect (meaning this test can be used) for the duration of the COVID-19 declaration under Section 564(b)(1) of the Act, 21 U.S.C. section 360bbb-3(b)(1), unless the authorization is terminated or revoked.     Resp Syncytial  Virus by PCR NEGATIVE NEGATIVE Final    Comment: (NOTE) Fact Sheet for Patients: BloggerCourse.com  Fact Sheet for Healthcare Providers: SeriousBroker.it  This test is not yet approved or  cleared by the [place] FDA and has been authorized for detection and/or diagnosis of SARS-CoV-2 by FDA under an Emergency Use Authorization (EUA). This EUA will remain in effect (meaning this test can be used) for the duration of the COVID-19 declaration under Section 564(b)(1) of the Act, 21 U.S.C. section 360bbb-3(b)(1), unless the authorization is terminated or revoked.  Performed at [facility], [address], [place], [place] [address]   Blood Culture (routine x 2)     Status: None   Collection Time: 09/17/23 12:51 PM   Specimen: BLOOD  Result Value Ref Range Status   Specimen Description BLOOD RIGHT ANTECUBITAL  Final   Special Requests   Final    BOTTLES DRAWN AEROBIC AND ANAEROBIC Blood Culture adequate volume   Culture   Final    NO GROWTH 5 DAYS Performed at [facility], [address] [place], [place] [address]    Report Status 09/22/2023 FINAL  Final  Blood Culture (routine x 2)     Status: None   Collection Time: 09/17/23 12:56 PM   Specimen: BLOOD RIGHT HAND  Result Value Ref Range Status   Specimen Description BLOOD RIGHT HAND  Final   Special Requests   Final    BOTTLES DRAWN AEROBIC ONLY Blood Culture results may not be optimal due to an inadequate volume of blood received in culture bottles   Culture   Final    NO GROWTH 5 DAYS Performed at [facility], [address] [place], [place] [address]    Report Status 09/22/2023 FINAL  Final  Surgical pcr screen     Status: None   Collection Time: 09/18/23  9:20 PM   Specimen: Nasal Mucosa; Nasal Swab  Result Value Ref Range Status   MRSA, PCR NEGATIVE NEGATIVE Final   Staphylococcus aureus NEGATIVE NEGATIVE Final    Comment: (NOTE) The Xpert SA Assay (FDA approved for NASAL specimens in patients 51 years of age and older), is one component of a comprehensive surveillance program. It is not intended to diagnose infection nor to guide or monitor treatment. Performed at [facility], [address], [place], [place] [address]   MRSA Next Gen by PCR, Nasal     Status: None   Collection Time: 09/19/23 10:12 AM   Specimen: Nasal Mucosa; Nasal Swab  Result Value Ref Range Status   MRSA by PCR Next Gen NOT DETECTED NOT DETECTED Final    Comment: (NOTE) The GeneXpert MRSA Assay (FDA approved for NASAL specimens only), is one component of a comprehensive MRSA colonization surveillance program. It is not intended to diagnose MRSA infection nor to guide or monitor treatment for MRSA infections. Test performance is not FDA approved in patients less than 79 years old. Performed at [facility], [address] [place], [place] [address]   Aerobic/Anaerobic Culture w Gram Stain (surgical/deep wound)     Status: None (Preliminary result)   Collection Time: 09/25/23  4:12 PM   Specimen: PATH Soft tissue  Result Value Ref Range Status   Specimen Description TISSUE LEFT THIGH  Final   Special Requests NONE  Final   Gram Stain   Final    FEW WBC PRESENT,BOTH PMN AND MONONUCLEAR RARE GRAM POSITIVE COCCI IN PAIRS Performed at [facility], [address], [place], [place] [address]    Culture PENDING  Incomplete   Report Status PENDING  Incomplete   Pertinent Lab.    Latest Ref Rng & Units 09/25/2023    6:56 AM 09/24/2023    5:47 AM 09/23/2023    5:48 AM  CBC  WBC 4.0 - 10.5 K/uL 5.6  5.8  5.8   Hemoglobin 12.0 - 15.0 g/dL 7.7  7.4  8.1   Hematocrit 36.0 - 46.0 % 26.3  25.0  26.7   Platelets 150 - 400 K/uL 500  493  433       Latest Ref Rng & Units 09/25/2023    6:56 AM 09/21/2023    7:50 AM 09/20/2023    5:55 AM  CMP  Glucose 70 - 99 mg/dL 562  92  91   BUN 8 - 23 mg/dL 10  9  10    Creatinine 0.44 - 1.00 mg/dL 1.30  8.65  7.84   Sodium 135 - 145 mmol/L 144  142  142   Potassium 3.5 - 5.1 mmol/L 3.5  3.6  3.5   Chloride 98 - 111 mmol/L 115  111  112   CO2 22 - 32 mmol/L 23  20  22    Calcium 8.9 - 10.3 mg/dL 8.6  8.3  8.3   Total Protein 6.5  - 8.1 g/dL   5.4   Total Bilirubin <1.2 mg/dL   0.4   Alkaline Phos 38 - 126 U/L   39   AST 15 - 41 U/L   17   ALT 0 - 44 U/L   15      Pertinent Imaging today Plain films and CT images have been personally visualized and interpreted; radiology reports have been reviewed. Decision making incorporated into the Impression  No results found. I have personally spent 35 minutes involved in face-to-face and non-face-to-face activities for this patient on the day of the visit. Professional time spent includes the following activities: Preparing to see the patient (review of tests), Obtaining and/or reviewing separately obtained history (admission/discharge record), Performing a medically appropriate examination and/or evaluation , Ordering medications/tests/procedures, referring and communicating with other health care professionals, Documenting clinical information in the EMR, Independently interpreting results (not separately reported), Communicating results to the patient/family/caregiver, Counseling and educating the patient/family/caregiver and Care coordination (not separately reported).   Plan d/w requesting provider as well as ID pharm D  Of note, portions of this note may have been created with voice recognition software. While this note has been edited for accuracy, occasional wrong-word or 'sound-a-like' substitutions may have occurred due to the inherent limitations of voice recognition software.   Electronically signed by:   Odette Fraction, MD Infectious Disease Physician [facility] Pager: [phone]

## 2023-09-26 NOTE — Assessment & Plan Note (Addendum)
Noted during OT on 11/7, TED hose ordered. Abdominal binder added 11/8. Most recent orthostatic vitals 152/68 lying, 132/55 sitting, 115/52 standing. --PT/OT eval and treat --continue to monitor

## 2023-09-26 NOTE — Progress Notes (Signed)
Daily Progress Note Intern Pager: [phone]  Patient name: Asayo Buckalew Medical record number: [id number] Date of birth: [birth year] Age: 77 y.o. Gender: female  Primary Care Provider: Storm Frisk, MD Consultants: plastic surgery Code Status: full  Pt Overview and Major Events to Date:  11/3: admitted to [facility] 11/11: I&D L thigh  Assessment and Plan: Kaydience Caudle is a 77 year old female admitted for L thigh wound after crush injury with imaging concerning for necrotizing myositis and early osteomyelitis. Surgical treatment was delayed due to concern that pt lacked capacity to consent, pt was later deemed to have capacity per psychiatry assessment. Had I&D yesterday, pending further wound culture data for antibiotic plan.  Pertinent PMH/PSH includes HLD, schizophrenia, COPD, osteoporosis.  Assessment & Plan Necrotizing myositis Following crush injury in September 2024. S/p I&D. Surgical wound cx gram stain with rare GPC in pairs. Pain appears to be well controlled. - Plastic surgery following, appreciate recommendations  -dressing change tomorrow --F/u surgical wound cultures and susceptibilities --pending updated ID recommendations - P.o. linezolid 600 mg twice daily - IV Zosyn per pharmacy - Scheduled tylenol 650mg  q6h for pain - TOC consult, anticipated SNF at discharge Osteomyelitis of left femur (HCC) Early acute osteomyelitis found on CT. Treatment plan as above. Diabetes mellitus without complication (HCC) A1c 6.1 in Sept 24. Blood glucose has been well controlled and stable during hospital stay.  Will continue to monitor. - CBG monitoring AC/HS - Hold home metformin Dementia (HCC) Alzheimer's dementia. - Memantine 5 mg twice daily, Aricept 10 mg at bedtime - Daily precautions - Fall precautions Anemia Normocytic anemia.  Hemoglobin has been stable between 7-8.  Suspect component of anemia of chronic disease.  No evidence of active bleeding. - AM  CBC Orthostatic hypotension Noted during OT on 11/7, TED hose ordered. Abdominal binder added 11/8. Most recent orthostatic vitals 152/68 lying, 132/55 sitting, 115/52 standing. --PT/OT eval and treat --continue to monitor Goals of care, counseling/discussion Pt requested DNR status on admission, however had questionable capacity to make medical decisions and was made full code by default. Evaluated by psychiatry and deemed to have capacity to make a decision about surgery, specifically can make task specific medical decisions.  --continue to discuss code status with patient and assess for understanding --pt will likely need legal guardian appointed, TOC following   Chronic and Stable Issues: HLD-Have not started home Zetia 10 mg daily, fenofibrate 160 mg daily (unclear if taking) Schizophrenia-continue home clozapine 200 mg at bedtime  COPD-Continue Symbicort 2 puffs twice daily, albuterol as needed Osteoporosis-Have not ordered alendronate 70 mg once a week on Wednesdays per chart review  FEN/GI: regular PPx: lovenox Dispo: pending clinical improvement  Subjective:  Pt sleeping soundly in bed on exam. Awakens briefly to voice, denies pain. Asked pt if she remembers what happened yesterday, responds no.   Objective: Temp:  [97.8 F (36.6 C)-98.4 F (36.9 C)] 97.9 F (36.6 C) (11/12 0730) Pulse Rate:  [83-99] 83 (11/12 0730) Resp:  [14-19] 18 (11/12 0730) BP: (110-140)/(57-83) 126/64 (11/12 0730) SpO2:  [94 %-100 %] 100 % (11/12 0730) Physical Exam: General: sleeping in bed, opens eyes and responds with one word to questions/light touch, falls asleep quickly after this Cardiovascular: RRR, normal S1/S2 Respiratory: CTAB, normal WOB on RA Abdomen: normoactive bowel sounds, soft, nontender, nondistended Extremities: L thigh covered in clean, dry dressings, non-pitting edema to L calf, no edema to RLE  Laboratory: Most recent CBC Lab Results  Component Value Date  WBC 5.6  09/25/2023   HGB 7.7 (L) 09/25/2023   HCT 26.3 (L) 09/25/2023   MCV 90.4 09/25/2023   PLT 500 (H) 09/25/2023   Most recent BMP    Latest Ref Rng & Units 09/25/2023    6:56 AM  BMP  Glucose 70 - 99 mg/dL 540   BUN 8 - 23 mg/dL 10   Creatinine 9.81 - 1.00 mg/dL 1.91   Sodium 478 - 295 mmol/L 144   Potassium 3.5 - 5.1 mmol/L 3.5   Chloride 98 - 111 mmol/L 115   CO2 22 - 32 mmol/L 23   Calcium 8.9 - 10.3 mg/dL 8.6     Jameir Ake, Tacey Ruiz, MD 09/26/2023, 8:30 AM  PGY-1, [facility] FPTS Intern pager: [phone], text pages welcome Secure chat group [facility] Teaching Service

## 2023-09-26 NOTE — Progress Notes (Signed)
Occupational Therapy Treatment Patient Details Name: Elizabeth Schwartz MRN: [id number] DOB: [birth year] Today's Date: 09/26/2023   History of present illness Pt is a 77 y/o F admitted on 09/17/23 after being advised by [facility] nurse to go to the ED after assessing pt's wound. CT showed necrotic myositis, pt also found to have acute osteomyelitis. Pt found to have symptomatic orthostatic hypotension during therapies 11/6-11/8.  S/P I&D 11/11. PMH: anemia, COPD, dementia, depression, DM, HLD, HTN, osteoporosis, schizo-affective, SOB, syncope.   OT comments  Patient seen after I&D of L thigh wound.  Pt lethargic upon entry, but awakens to her name and is alert once engaged and mobilizing. Pt with increased confusion this am, re-oriented and requires increased time and cueing to sequence, follow commands and problem solve. She requires min assist +2 safety for transfers (stand pivot and when using RW), total assist for toileting and mod assist for LB dressing.  She denies dizziness today, but BP remains orthostatic- see below.  Continue to recommend <3hrs/day inpatient setting at dc.   BP Supine 129/56 (77)  BP EOB 105/830(92)  BP in recliner 99/50 (62)- pt left in recliner, reclined with legs elevated- RN notified      If plan is discharge home, recommend the following:  A little help with walking and/or transfers;Assistance with cooking/housework;Direct supervision/assist for financial management;Direct supervision/assist for medications management;Assist for transportation;Help with stairs or ramp for entrance;A little help with bathing/dressing/bathroom   Equipment Recommendations  Other (comment) (defer)    Recommendations for Other Services      Precautions / Restrictions Precautions Precautions: Fall Precaution Comments: watch BP Restrictions Weight Bearing Restrictions: No       Mobility Bed Mobility Overal bed mobility: Needs Assistance Bed Mobility: Supine to Sit     Supine to  sit: Min assist, HOB elevated     General bed mobility comments: trunk support, L LE support and cueing for sequencing    Transfers Overall transfer level: Needs assistance Equipment used: Rolling walker (2 wheels) Transfers: Sit to/from Stand, Bed to chair/wheelchair/BSC Sit to Stand: Min assist, +2 safety/equipment Stand pivot transfers: Min assist, +2 safety/equipment         General transfer comment: pivot to [facility] without RW, steps to recliner with min assist +2 safety     Balance Overall balance assessment: Needs assistance Sitting-balance support: Feet supported Sitting balance-Leahy Scale: Fair Sitting balance - Comments: supervision static sitting   Standing balance support: Bilateral upper extremity supported, During functional activity Standing balance-Leahy Scale: Poor Standing balance comment: relies on BUE and external support                           ADL either performed or assessed with clinical judgement   ADL Overall ADL's : Needs assistance/impaired     Grooming: Set up;Wash/dry face;Sitting           Upper Body Dressing : Set up;Sitting   Lower Body Dressing: Moderate assistance;Sit to/from stand Lower Body Dressing Details (indicate cue type and reason): requires assist for socks, min assist in standing Toilet Transfer: Minimal assistance;+2 for safety/equipment;BSC/3in1;Rolling walker (2 wheels) Toilet Transfer Details (indicate cue type and reason): stand step to [facility] Toileting- Clothing Manipulation and Hygiene: Maximal assistance;Sit to/from stand Toileting - Clothing Manipulation Details (indicate cue type and reason): urgency of bowel and requires assist for clean up , assist for hygiene     Functional mobility during ADLs: Minimal assistance;Rolling walker (2 wheels);+2 for safety/equipment  Extremity/Trunk Assessment              Vision       Perception     Praxis      Cognition Arousal: Lethargic,  Alert Behavior During Therapy: Flat affect Overall Cognitive Status: History of cognitive impairments - at baseline                                 General Comments: pt disoriented throughout session, initally reporting feb 2015.  Pt aware of having a procedure, but is unaware it has already happended.  Is unable to recall month after several minutes. Difficutly sequecing and poor aawareness.        Exercises      Shoulder Instructions       General Comments pt with L thigh ace wrap in place, BP monitiored during session    Pertinent Vitals/ Pain       Pain Assessment Pain Assessment: 0-10 Faces Pain Scale: Hurts little more Pain Location: L thigh Pain Descriptors / Indicators: Grimacing, Discomfort, Operative site guarding Pain Intervention(s): Limited activity within patient's tolerance, Monitored during session, Repositioned  Home Living                                          Prior Functioning/Environment              Frequency  Min 1X/week        Progress Toward Goals  OT Goals(current goals can now be found in the care plan section)  Progress towards OT goals: Progressing toward goals  Acute Rehab OT Goals Patient Stated Goal: get better OT Goal Formulation: With patient Time For Goal Achievement: 10/03/23 Potential to Achieve Goals: Fair  Plan      Co-evaluation                 AM-PAC OT "6 Clicks" Daily Activity     Outcome Measure   Help from another person eating meals?: A Little Help from another person taking care of personal grooming?: A Little Help from another person toileting, which includes using toliet, bedpan, or urinal?: A Lot Help from another person bathing (including washing, rinsing, drying)?: A Lot Help from another person to put on and taking off regular upper body clothing?: A Little Help from another person to put on and taking off regular lower body clothing?: A Lot 6 Click Score:  15    End of Session Equipment Utilized During Treatment: Gait belt;Rolling walker (2 wheels)  OT Visit Diagnosis: Other abnormalities of gait and mobility (R26.89);Muscle weakness (generalized) (M62.81);Pain;Other symptoms and signs involving cognitive function Pain - Right/Left: Left Pain - part of body: Leg   Activity Tolerance Patient tolerated treatment well   Patient Left in chair;with call bell/phone within reach;with chair alarm set   Nurse Communication Mobility status;Precautions;Other (comment) (BP)        Time: 2952-8413 OT Time Calculation (min): 27 min  Charges: OT General Charges $OT Visit: 1 Visit OT Treatments $Self Care/Home Management : 8-22 mins  Barry Brunner, OT Acute Rehabilitation Services Office [phone]   Chancy Milroy 09/26/2023, 1:10 PM

## 2023-09-26 NOTE — Anesthesia Postprocedure Evaluation (Signed)
Anesthesia Post Note  Patient: Elizabeth Schwartz  Procedure(s) Performed: IRRIGATION AND DEBRIDEMENT  THIGH WOUND (Left: Thigh)     Patient location during evaluation: PACU Anesthesia Type: General Level of consciousness: awake and alert, oriented and patient cooperative Pain management: pain level controlled Vital Signs Assessment: post-procedure vital signs reviewed and stable Respiratory status: spontaneous breathing, nonlabored ventilation and respiratory function stable Cardiovascular status: blood pressure returned to baseline and stable Postop Assessment: no apparent nausea or vomiting Anesthetic complications: no   No notable events documented.  Last Vitals:  Vitals:   09/25/23 2019 09/26/23 0624  BP: 132/66 127/69  Pulse: 91 90  Resp: 19 18  Temp: 36.8 C 36.8 C  SpO2: 94% 100%    Last Pain:  Vitals:   09/26/23 0624  TempSrc: Oral  PainSc:                  Lannie Fields

## 2023-09-26 NOTE — Assessment & Plan Note (Signed)
A1c 6.1 in Sept 24. Blood glucose has been well controlled and stable during hospital stay.  Will continue to monitor. - CBG monitoring AC/HS - Hold home metformin

## 2023-09-26 NOTE — Progress Notes (Signed)
Physical Therapy Treatment Patient Details Name: Elizabeth Schwartz MRN: [id number] DOB: 30-May-1946 Today's Date: 09/26/2023   History of Present Illness Pt is a 77 y/o F admitted on 09/17/23 after being advised by [facility] nurse to go to the ED after assessing pt's wound. CT showed necrotic myositis, pt also found to have acute osteomyelitis. Pt found to have symptomatic orthostatic hypotension during therapies 11/6-11/8.  S/P I&D 11/11. PMH: anemia, COPD, dementia, depression, DM, HLD, HTN, osteoporosis, schizo-affective, SOB, syncope.    PT Comments  Pt reassessed s/p I&D of L thigh wound. Pt lethargic upon entry, however, able to aroused with upright positioning and conversant, however, is still confused. Pt requiring two person minimal assist for transfers and limited ambulation in room using RW. Remains limited by orthostatic hypotension, impaired cognition, weakness, balance deficits. Patient will benefit from continued inpatient follow up therapy, <3 hours/day  Orthostatic Vital Signs: Supine: 129/56 (77) Sitting: 105/83 (92) BP sitting in recliner: 99/50 (62)   If plan is discharge home, recommend the following: A little help with bathing/dressing/bathroom;Assistance with cooking/housework;Supervision due to cognitive status;Direct supervision/assist for financial management;Assist for transportation;Help with stairs or ramp for entrance;Direct supervision/assist for medications management;A lot of help with walking and/or transfers   Can travel by private vehicle     Yes  Equipment Recommendations  Rolling walker (2 wheels);BSC/3in1    Recommendations for Other Services       Precautions / Restrictions Precautions Precautions: Fall Precaution Comments: watch BP Restrictions Weight Bearing Restrictions: No     Mobility  Bed Mobility Overal bed mobility: Needs Assistance Bed Mobility: Supine to Sit     Supine to sit: HOB elevated, Mod assist     General bed mobility  comments: trunk support, L LE support and cueing for sequencing    Transfers Overall transfer level: Needs assistance Equipment used: Rolling walker (2 wheels) Transfers: Sit to/from Stand, Bed to chair/wheelchair/BSC Sit to Stand: Min assist, +2 safety/equipment Stand pivot transfers: Min assist, +2 safety/equipment         General transfer comment: pivot to [facility] without RW, steps to recliner with min assist +2 safety    Ambulation/Gait                   Stairs             Wheelchair Mobility     Tilt Bed    Modified Rankin (Stroke Patients Only)       Balance Overall balance assessment: Needs assistance Sitting-balance support: Feet supported Sitting balance-Leahy Scale: Fair Sitting balance - Comments: supervision static sitting   Standing balance support: Bilateral upper extremity supported, During functional activity Standing balance-Leahy Scale: Poor Standing balance comment: relies on BUE and external support                            Cognition Arousal: Lethargic, Alert Behavior During Therapy: Flat affect Overall Cognitive Status: History of cognitive impairments - at baseline                                 General Comments: pt disoriented throughout session, initally reporting feb 2015.  Pt aware of having a procedure, but is unaware it has already happended.  Is unable to recall month after several minutes. Difficutly sequecing and poor aawareness.        Exercises      General Comments General  comments (skin integrity, edema, etc.): pt with R TED hose and L thigh ace wrap in place, BP monitired during session      Pertinent Vitals/Pain Pain Assessment Pain Assessment: 0-10 Faces Pain Scale: Hurts little more Pain Location: L thigh Pain Descriptors / Indicators: Grimacing, Discomfort, Operative site guarding Pain Intervention(s): Limited activity within patient's tolerance, Monitored during session     Home Living                          Prior Function            PT Goals (current goals can now be found in the care plan section) Acute Rehab PT Goals Potential to Achieve Goals: Fair Progress towards PT goals: Progressing toward goals    Frequency    Min 1X/week      PT Plan      Co-evaluation PT/OT/SLP Co-Evaluation/Treatment: Yes Reason for Co-Treatment: For patient/therapist safety;To address functional/ADL transfers PT goals addressed during session: Mobility/safety with mobility        AM-PAC PT "6 Clicks" Mobility   Outcome Measure  Help needed turning from your back to your side while in a flat bed without using bedrails?: A Little Help needed moving from lying on your back to sitting on the side of a flat bed without using bedrails?: A Lot Help needed moving to and from a bed to a chair (including a wheelchair)?: A Little Help needed standing up from a chair using your arms (e.g., wheelchair or bedside chair)?: A Little Help needed to walk in hospital room?: A Lot Help needed climbing 3-5 steps with a railing? : Total 6 Click Score: 14    End of Session Equipment Utilized During Treatment: Gait belt Activity Tolerance: Patient tolerated treatment well Patient left: in chair;with call bell/phone within reach;with chair alarm set Nurse Communication: Mobility status PT Visit Diagnosis: Muscle weakness (generalized) (M62.81);Pain;Other abnormalities of gait and mobility (R26.89);Difficulty in walking, not elsewhere classified (R26.2) Pain - Right/Left: Left Pain - part of body: Leg;Shoulder;Ankle and joints of foot     Time: 4098-1191 PT Time Calculation (min) (ACUTE ONLY): 27 min  Charges:    $Therapeutic Activity: 8-22 mins PT General Charges $$ ACUTE PT VISIT: 1 Visit                     Lillia Pauls, PT, DPT Acute Rehabilitation Services Office [phone]    Norval Morton 09/26/2023, 2:27 PM

## 2023-09-26 NOTE — Plan of Care (Signed)
  Problem: Metabolic: Goal: Ability to maintain appropriate glucose levels will improve Outcome: Progressing   Problem: Nutritional: Goal: Maintenance of adequate nutrition will improve Outcome: Progressing   Problem: Skin Integrity: Goal: Risk for impaired skin integrity will decrease Outcome: Progressing   Problem: Clinical Measurements: Goal: Will remain free from infection Outcome: Progressing

## 2023-09-26 NOTE — Plan of Care (Signed)
PM Assessment of patient mental status. Patient wide awake and conversant on entering the room. Alert oriented to situation, person, place. Sitting upright in hospital chair. Helped patient pick TV channel she would like to watch. No complaints at this time, says her post surgical pain is minimal. Will continue to monitor patient closely.  Celine Mans, MD, PGY-2 [facility] 3:08 PM 09/26/2023

## 2023-09-26 NOTE — Progress Notes (Signed)
1 Day Post-Op  Subjective: 77 year old female postop day 1 after debridement of necrotic medial thigh wound of left leg and placement of myriad wound matrix with Dr. Ladona Ridgel yesterday 09/25/2023.  Patient reports she is doing well this a.m., she does not have any specific complaints.  She reports pain is well-controlled at this time.  She denies any fevers or chills.  She is sitting in bedside chair.  She does have some questions about surgery.  No acute overnight events.  Objective: Vital signs in last 24 hours: Temp:  [97.8 F (36.6 C)-98.4 F (36.9 C)] 97.9 F (36.6 C) (11/12 0730) Pulse Rate:  [83-99] 83 (11/12 0730) Resp:  [14-19] 18 (11/12 0730) BP: (110-140)/(57-83) 126/64 (11/12 0730) SpO2:  [94 %-100 %] 96 % (11/12 0849) Last BM Date : 09/21/23  Intake/Output from previous day: 11/11 0701 - 11/12 0700 In: 450 [I.V.:450] Out: 50 [Blood:50] Intake/Output this shift: No intake/output data recorded.  General appearance: alert, cooperative, no distress, and sitting in bedside chair Resp: Unlabored Extremities: Right lower extremity with compression dressing in place.  Left lower extremity with dressing on thigh noted.  Ace wrap in place.  Dressing is unsoiled.  No tenderness noted of left thigh surgical site or left knee.  She has sensation intact distally.  Distal extremities warm to touch.  Mild postoperative swelling noted.  Resting was not removed.  Lab Results:     Latest Ref Rng & Units 09/25/2023    6:56 AM 09/24/2023    5:47 AM 09/23/2023    5:48 AM  CBC  WBC 4.0 - 10.5 K/uL 5.6  5.8  5.8   Hemoglobin 12.0 - 15.0 g/dL 7.7  7.4  8.1   Hematocrit 36.0 - 46.0 % 26.3  25.0  26.7   Platelets 150 - 400 K/uL 500  493  433     BMET Recent Labs    09/25/23 0656  NA 144  K 3.5  CL 115*  CO2 23  GLUCOSE 117*  BUN 10  CREATININE 0.99  CALCIUM 8.6*   PT/INR No results for input(s): "LABPROT", "INR" in the last 72 hours. ABG No results for input(s): "PHART",  "HCO3" in the last 72 hours.  Invalid input(s): "PCO2", "PO2"  Studies/Results: No results found.  Anti-infectives: Anti-infectives (From admission, onward)    Start     Dose/Rate Route Frequency Ordered Stop   09/18/23 1600  vancomycin (VANCOCIN) IVPB 1000 mg/200 mL premix  Status:  Discontinued        1,000 mg 200 mL/hr over 60 Minutes Intravenous Every 24 hours 09/17/23 1904 09/18/23 1402   09/18/23 1500  linezolid (ZYVOX) tablet 600 mg        600 mg Oral Every 12 hours 09/18/23 1402     09/17/23 2200  piperacillin-tazobactam (ZOSYN) IVPB 3.375 g        3.375 g 12.5 mL/hr over 240 Minutes Intravenous Every 8 hours 09/17/23 1904     09/17/23 1545  piperacillin-tazobactam (ZOSYN) IVPB 3.375 g        3.375 g 100 mL/hr over 30 Minutes Intravenous  Once 09/17/23 1531 09/17/23 1701   09/17/23 1300  vancomycin (VANCOREADY) IVPB 1500 mg/300 mL        1,500 mg 150 mL/hr over 120 Minutes Intravenous  Once 09/17/23 1253 09/17/23 1615       Assessment/Plan: s/p Procedure(s): IRRIGATION AND DEBRIDEMENT  THIGH WOUND  77 year old female status post debridement of left leg wound and application of myriad wound matrix yesterday.  She is doing well, no acute overnight events.  Will plan for first dressing change tomorrow.  Optimize nutritional status with protein shakes and multivitamin.  DVT prophylaxis per primary - patient started on Lovenox injections today.   LOS: 9 days    Leslee Home, PA-C 09/26/2023

## 2023-09-26 NOTE — TOC Initial Note (Addendum)
Transition of Care [facility]) - Initial/Assessment Note    Patient Details  Name: Elizabeth Schwartz MRN: [id number] Date of Birth: Sep 22, 1946  [facility] CM/SW Contact:    Lorri Frederick, LCSW Phone Number: 09/26/2023, 1:57 PM  Clinical Narrative:   CSW spoke with pt regarding PT recommendation for SNF.  Pt is agreeable, discussed that she cannot return to [facility] and pt state she is "not surprised", would like to pursue other option in [place].  Medicare choice document provided.  Permission given to speak with niece Bonita Quin.  Referral sent out in hub for SNF.       CSW also spoke with [facility] APS, who reports that the APS report from last week was screened out, she is not allowed to provide the reason why.               Expected Discharge Plan: Skilled Nursing Facility Barriers to Discharge: SNF Pending bed offer   Patient Goals and CMS Choice Patient states their goals for this hospitalization and ongoing recovery are:: To get out out the hospital CMS Medicare.gov Compare Post Acute Care list provided to:: Patient Choice offered to / list presented to : Patient      Expected Discharge Plan and Services In-house Referral: Clinical Social Work   Post Acute Care Choice: Skilled Nursing Facility Living arrangements for the past 2 months: Single Family Home                                      Prior Living Arrangements/Services Living arrangements for the past 2 months: Single Family Home Lives with:: Self Patient language and need for interpreter reviewed:: Yes        Need for Family Participation in Patient Care: Yes (Comment) Care giver support system in place?: Yes (comment) Current home services: Other (comment) (none) Criminal Activity/Legal Involvement Pertinent to Current Situation/Hospitalization: No - Comment as needed  Activities of Daily Living   ADL Screening (condition at time of admission) Independently  performs ADLs?: No Does the patient have a NEW difficulty with bathing/dressing/toileting/self-feeding that is expected to last >3 days?: Yes (Initiates electronic notice to provider for possible OT consult) Does the patient have a NEW difficulty with getting in/out of bed, walking, or climbing stairs that is expected to last >3 days?: Yes (Initiates electronic notice to provider for possible PT consult) Does the patient have a NEW difficulty with communication that is expected to last >3 days?: No Is the patient deaf or have difficulty hearing?: No Does the patient have difficulty seeing, even when wearing glasses/contacts?: No Does the patient have difficulty concentrating, remembering, or making decisions?: No  Permission Sought/Granted Permission sought to share information with : Case Manager Permission granted to share information with : Yes, Verbal Permission Granted  Share Information with NAME: niece Bonita Quin Distler           Emotional Assessment Appearance:: Appears stated age Attitude/Demeanor/Rapport: Engaged Affect (typically observed): Pleasant, Calm Orientation: :  (Not charted in flowsheet) Alcohol / Substance Use: Not Applicable Psych Involvement: No (comment)  Admission diagnosis:  Necrotizing myositis [M60.80] Leg wound, left [S81.802A] Anemia, unspecified type [D64.9] Other acute osteomyelitis of left femur (HCC) [M86.152] Type 2 diabetes mellitus without complication, unspecified whether long term insulin use (HCC) [E11.9] Dementia, unspecified dementia severity, unspecified dementia type, unspecified whether behavioral, psychotic, or mood disturbance or anxiety (HCC) [F03.90] Patient Active Problem List  Diagnosis Date Noted   Orthostatic hypotension 09/25/2023   Goals of care, counseling/discussion 09/25/2023   Necrotizing myositis 09/23/2023   Diabetes mellitus without complication (HCC) 09/23/2023   Dementia (HCC) 09/23/2023   Long term current use of  clozapine 09/18/2023   Myositis of left thigh 09/18/2023   Osteomyelitis of left femur (HCC) 09/18/2023   Pressure injury of left thigh, stage 4 (HCC) 09/18/2023   Pressure injury of deep tissue of left thigh 09/17/2023   Rhabdomyolysis 08/10/2023   AKI (acute kidney injury) (HCC) 08/10/2023   Transaminitis 08/10/2023   CAP (community acquired pneumonia) 08/10/2023   Lactic acidosis 08/10/2023   Elevated troponin 08/10/2023   Pressure injury of skin with suspected deep tissue injury 08/10/2023   Porokeratosis 01/02/2023   Dementia due to Alzheimer's disease (HCC) 08/11/2022   Statin myopathy 04/21/2022   Pain due to onychomycosis of toenails of both feet 02/22/2022   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) 11/22/2021   Chronic pain of right hip 09/15/2021   Chronic pain of left knee 09/15/2021   Chronic left shoulder pain 05/24/2021   Mixed hyperlipidemia 03/22/2021   Pincer nail deformity 11/03/2020   Chronic diarrhea 03/20/2019   Hypertension 08/22/2016   Anemia 08/22/2016   Major neurocognitive disorder due to multiple etiologies with behavioral disturbance (HCC) 08/22/2016   Osteoporosis 08/22/2016   Asthma, moderate persistent 09/08/2011   Diverticulosis 09/08/2011   Former smoker 09/08/2011   GERD 06/04/2007   PCP:  Storm Frisk, MD Pharmacy:   [facility] - [place], [place] - [address] [place] [address] Phone: [phone] Fax: [phone]  [facility] - [place], [place] - [address] [facility] [address] [place] [address] Phone: [phone] Fax: [phone]  [facility] [address] [place] [address] Phone: [phone] Fax: [phone]     Social Determinants of Health (SDOH) Social History: SDOH Screenings   Food Insecurity: No Food Insecurity (09/18/2023)  Housing: Patient Unable To Answer (09/18/2023)  Transportation  Needs: No Transportation Needs (09/18/2023)  Utilities: Not At Risk (09/18/2023)  Alcohol Screen: Low Risk  (03/22/2023)  Depression (PHQ2-9): Low Risk  (07/13/2023)  Financial Resource Strain: Low Risk  (03/22/2023)  Physical Activity: Insufficiently Active (03/22/2023)  Social Connections: Socially Isolated (03/22/2023)  Stress: No Stress Concern Present (03/22/2023)  Tobacco Use: Medium Risk (09/25/2023)   SDOH Interventions:     Readmission Risk Interventions     No data to display

## 2023-09-26 NOTE — Assessment & Plan Note (Signed)
Early acute osteomyelitis found on CT. Treatment plan as above.

## 2023-09-26 NOTE — Assessment & Plan Note (Signed)
Alzheimer's dementia. - Memantine 5 mg twice daily, Aricept 10 mg at bedtime - Daily precautions - Fall precautions

## 2023-09-27 DIAGNOSIS — L89224 Pressure ulcer of left hip, stage 4: Secondary | ICD-10-CM | POA: Diagnosis not present

## 2023-09-27 DIAGNOSIS — E876 Hypokalemia: Secondary | ICD-10-CM

## 2023-09-27 LAB — CBC
HCT: 25.2 % — ABNORMAL LOW (ref 36.0–46.0)
Hemoglobin: 7.4 g/dL — ABNORMAL LOW (ref 12.0–15.0)
MCH: 27.2 pg (ref 26.0–34.0)
MCHC: 29.4 g/dL — ABNORMAL LOW (ref 30.0–36.0)
MCV: 92.6 fL (ref 80.0–100.0)
Platelets: 414 10*3/uL — ABNORMAL HIGH (ref 150–400)
RBC: 2.72 MIL/uL — ABNORMAL LOW (ref 3.87–5.11)
RDW: 17.2 % — ABNORMAL HIGH (ref 11.5–15.5)
WBC: 6.6 10*3/uL (ref 4.0–10.5)
nRBC: 0 % (ref 0.0–0.2)

## 2023-09-27 LAB — BASIC METABOLIC PANEL
Anion gap: 8 (ref 5–15)
BUN: 13 mg/dL (ref 8–23)
CO2: 22 mmol/L (ref 22–32)
Calcium: 8.3 mg/dL — ABNORMAL LOW (ref 8.9–10.3)
Chloride: 115 mmol/L — ABNORMAL HIGH (ref 98–111)
Creatinine, Ser: 1 mg/dL (ref 0.44–1.00)
GFR, Estimated: 58 mL/min — ABNORMAL LOW (ref >60)
Glucose, Bld: 90 mg/dL (ref 70–99)
Potassium: 2.8 mmol/L — ABNORMAL LOW (ref 3.5–5.1)
Sodium: 145 mmol/L (ref 135–145)

## 2023-09-27 LAB — GLUCOSE, CAPILLARY
Glucose-Capillary: 81 mg/dL (ref 70–99)
Glucose-Capillary: 106 mg/dL — ABNORMAL HIGH (ref 70–99)
Glucose-Capillary: 112 mg/dL — ABNORMAL HIGH (ref 70–99)

## 2023-09-27 MED ORDER — OXYCODONE HCL 5 MG PO TABS
2.5000 mg | ORAL_TABLET | Freq: Once | ORAL | Status: AC
  Administered 2023-09-29: 2.5 mg via ORAL
  Filled 2023-09-27 (×3): qty 1

## 2023-09-27 MED ORDER — POTASSIUM CHLORIDE CRYS ER 20 MEQ PO TBCR
40.0000 meq | EXTENDED_RELEASE_TABLET | ORAL | Status: AC
  Administered 2023-09-27 (×3): 40 meq via ORAL
  Filled 2023-09-27 (×3): qty 2

## 2023-09-27 NOTE — Assessment & Plan Note (Addendum)
Normocytic anemia.  Hemoglobin has been stable between 7-8.  Suspect component of anemia of chronic disease.  No evidence of active bleeding. AM Hgb 7.4. - continue to monitor

## 2023-09-27 NOTE — Assessment & Plan Note (Addendum)
Following crush injury in September 2024. S/p I&D. Surgical wound cx gram stain with few pseudomonas. Pain is well controlled - Plastic surgery following, appreciate recommendations  -dressing change today --ID following, will continue linezolid and zosyn until more culture information results - Scheduled tylenol 650mg  q6h for pain - TOC consult, anticipated SNF at discharge

## 2023-09-27 NOTE — Progress Notes (Signed)
Daily Progress Note Intern Pager: [phone]  Patient name: Nivia Alpaugh Medical record number: [id number] Date of birth: 1946-08-12 Age: 77 y.o. Gender: female  Primary Care Provider: Storm Frisk, MD Consultants: plastic surgery, ID Code Status: full  Pt Overview and Major Events to Date:  11/3: admitted to [facility]MTS 11/11: I&D L thigh  Assessment and Plan: Shaneal Beville is a 77 year old female admitted for L thigh wound after crush injury with imaging concerning for necrotizing myositis and early osteomyelitis. Surgical treatment was delayed due to concern that pt lacked capacity to consent, pt was later deemed to have capacity per psychiatry assessment. Had I&D yesterday, pending further wound culture data for antibiotic plan.  Pertinent PMH/PSH includes HLD, schizophrenia, COPD, osteoporosis.  Assessment & Plan Necrotizing myositis Following crush injury in September 2024. S/p I&D. Surgical wound cx gram stain with few pseudomonas. Pain is well controlled - Plastic surgery following, appreciate recommendations  -dressing change today --ID following, will continue linezolid and zosyn until more culture information results - Scheduled tylenol 650mg  q6h for pain - TOC consult, anticipated SNF at discharge Osteomyelitis of left femur (HCC) Early acute osteomyelitis found on CT. Treatment plan as above. Diabetes mellitus without complication (HCC) A1c 6.1 in Sept 24. Blood glucose has been well controlled and stable during hospital stay.  Will continue to monitor. - CBG monitoring AC/HS - Hold home metformin Dementia (HCC) Alzheimer's dementia. - Memantine 5 mg twice daily, Aricept 10 mg at bedtime - Daily precautions - Fall precautions Anemia Normocytic anemia.  Hemoglobin has been stable between 7-8.  Suspect component of anemia of chronic disease.  No evidence of active bleeding. AM Hgb 7.4. - continue to monitor Orthostatic hypotension Noted during OT on 11/7, TED  hose ordered. Abdominal binder added 11/8. Most recent orthostatic vitals 152/68 lying, 132/55 sitting, 115/52 standing. --PT/OT eval and treat --continue to monitor Goals of care, counseling/discussion Pt requested DNR status on admission, however had questionable capacity to make medical decisions and was made full code by default. Evaluated by psychiatry and deemed to have capacity to make a decision about surgery, specifically can make task specific medical decisions.  --continue to discuss code status with patient and assess for understanding --pt will likely need legal guardian appointed, TOC following Hypokalemia K 2.8 this morning. --potassium supplementation with tablet x3 today --AM BMP    Chronic and Stable Issues: HLD-Have not started home Zetia 10 mg daily, fenofibrate 160 mg daily (unclear if taking) Schizophrenia-continue home clozapine 200 mg at bedtime  COPD-Continue Symbicort 2 puffs twice daily, albuterol as needed Osteoporosis-Have not ordered alendronate 70 mg once a week on Wednesdays per chart review  FEN/GI: regular PPx: lovenox Dispo: pending clinical improvement  Subjective:  Pt states she feels well today. Denies leg pain. States she is having some pain in the L abdomen.   Objective: Temp:  [98 F (36.7 C)-99.3 F (37.4 C)] 98.8 F (37.1 C) (11/13 0721) Pulse Rate:  [78-87] 87 (11/13 0752) Resp:  [18] 18 (11/13 0752) BP: (128-153)/(53-65) 153/59 (11/13 0721) SpO2:  [96 %-99 %] 99 % (11/13 0752) Physical Exam: General: laying down in bed, awake and conversant, in NAD Cardiovascular: RRR, normal S1/S2 Respiratory: CTAB, normal WOB on RA Abdomen: normoactive bowel sounds, soft, nontender, nondistended Extremities: L thigh covered in clean, dry dressing, diffuse edema to LLE (stable from prior exam), RLE covered with compression stocking   Laboratory: Most recent CBC Lab Results  Component Value Date  WBC 6.6 09/27/2023   HGB 7.4 (L)  09/27/2023   HCT 25.2 (L) 09/27/2023   MCV 92.6 09/27/2023   PLT 414 (H) 09/27/2023   Most recent BMP    Latest Ref Rng & Units 09/27/2023    5:59 AM  BMP  Glucose 70 - 99 mg/dL 90   BUN 8 - 23 mg/dL 13   Creatinine 0.98 - 1.00 mg/dL 1.19   Sodium 147 - 829 mmol/L 145   Potassium 3.5 - 5.1 mmol/L 2.8   Chloride 98 - 111 mmol/L 115   CO2 22 - 32 mmol/L 22   Calcium 8.9 - 10.3 mg/dL 8.3     Abbeygail Igoe, Tacey Ruiz, MD 09/27/2023, 8:17 AM  PGY-1, [place] [facility] FPTS Intern pager: [phone], text pages welcome Secure chat group [facility] Teaching Service

## 2023-09-27 NOTE — Assessment & Plan Note (Signed)
A1c 6.1 in Sept 24. Blood glucose has been well controlled and stable during hospital stay.  Will continue to monitor. - CBG monitoring AC/HS - Hold home metformin

## 2023-09-27 NOTE — Progress Notes (Signed)
Mobility Specialist: Progress Note   09/27/23 1519  Mobility  Activity Transferred to/from [facility];Transferred from chair to bed  Level of Assistance +2 (takes two people) Audiological scientist)  Location manager Ambulated (ft) 5 ft  Activity Response Tolerated well  Mobility Referral Yes  $Mobility charge 1 Mobility  Mobility Specialist Start Time (ACUTE ONLY) [phone]  Mobility Specialist Stop Time (ACUTE ONLY) 0307  Mobility Specialist Time Calculation (min) (ACUTE ONLY) 29 min    Pre Mobility (Sitting): 119/41 Immediate Post Mobility (Supine): 131/43   Pt was agreeable to mobility session with some encouragement and persistence - received in chair. Had c/o dizziness and lightheadedness. VSS throughout. MinA+2 for STS, pt requested to use BSC. MinA+2 for ambulation and transfer. Further ambulation deferred d/t large BM in [facility] and on floor. Both MS assisted with cleanup and peri care. A little confused with directions to back up and take side steps but overall cognition was better. SV for bed mobility getting back in. Left in bed with all needs met, call bell in reach. Bed alarm on.   Maurene Capes Mobility Specialist Please contact via SecureChat or Rehab office at [phone]

## 2023-09-27 NOTE — Assessment & Plan Note (Signed)
Pt requested DNR status on admission, however had questionable capacity to make medical decisions and was made full code by default. Evaluated by psychiatry and deemed to have capacity to make a decision about surgery, specifically can make task specific medical decisions.  --continue to discuss code status with patient and assess for understanding --pt will likely need legal guardian appointed, TOC following

## 2023-09-27 NOTE — Progress Notes (Signed)
Physical Therapy Treatment Patient Details Name: Elizabeth Schwartz MRN: [id number] DOB: 1946-02-12 Today's Date: 09/27/2023   History of Present Illness Pt is a 77 y/o F admitted on 09/17/23 after being advised by [facility] nurse to go to the ED after assessing pt's wound. CT showed necrotic myositis, pt also found to have acute osteomyelitis. Pt found to have symptomatic orthostatic hypotension during therapies 11/6-11/8.  S/P I&D 11/11. PMH: anemia, COPD, dementia, depression, DM, HLD, HTN, osteoporosis, schizo-affective, SOB, syncope.    PT Comments  Pt making steady progress today with improved cognition and activity tolerance. Pt ambulating a total of 30 ft with a walker and chair follow. Reports mild dizziness; BP 117/76 (89). Patient will benefit from continued inpatient follow up therapy, <3 hours/day.     If plan is discharge home, recommend the following: A little help with bathing/dressing/bathroom;Assistance with cooking/housework;Supervision due to cognitive status;Direct supervision/assist for financial management;Assist for transportation;Help with stairs or ramp for entrance;Direct supervision/assist for medications management;A lot of help with walking and/or transfers   Can travel by private vehicle     Yes  Equipment Recommendations  Rolling walker (2 wheels);BSC/3in1    Recommendations for Other Services       Precautions / Restrictions Precautions Precautions: Fall Precaution Comments: watch BP Restrictions Weight Bearing Restrictions: No     Mobility  Bed Mobility Overal bed mobility: Needs Assistance Bed Mobility: Supine to Sit     Supine to sit: HOB elevated, Mod assist     General bed mobility comments: trunk support, L LE support and cueing for sequencing    Transfers Overall transfer level: Needs assistance Equipment used: Rolling walker (2 wheels) Transfers: Sit to/from Stand Sit to Stand: Min assist, +2 safety/equipment           General  transfer comment: MinA to boost up to standing position, cues for wider BOS    Ambulation/Gait Ambulation/Gait assistance: Min assist, +2 safety/equipment Gait Distance (Feet): 30 Feet (25", 5") Assistive device: Rolling walker (2 wheels) Gait Pattern/deviations: Step-to pattern, Trunk flexed, Decreased dorsiflexion - left, Decreased weight shift to left, Decreased step length - left Gait velocity: decreased Gait velocity interpretation: <1.8 ft/sec, indicate of risk for recurrent falls   General Gait Details: Antalgic gait with increased external rotation of LLE, ambulating 25 ft then additional 5 with seated rest break. MinA for balance   Stairs             Wheelchair Mobility     Tilt Bed    Modified Rankin (Stroke Patients Only)       Balance Overall balance assessment: Needs assistance Sitting-balance support: Feet supported Sitting balance-Leahy Scale: Fair Sitting balance - Comments: supervision static sitting   Standing balance support: Bilateral upper extremity supported, During functional activity Standing balance-Leahy Scale: Poor Standing balance comment: relies on BUE and external support                            Cognition Arousal: Alert Behavior During Therapy: Flat affect Overall Cognitive Status: History of cognitive impairments - at baseline                                 General Comments: A&Ox4        Exercises      General Comments        Pertinent Vitals/Pain Pain Assessment Pain Assessment: Faces Faces Pain Scale: Hurts  even more Pain Location: L thigh Pain Descriptors / Indicators: Grimacing, Discomfort, Operative site guarding Pain Intervention(s): Monitored during session, Limited activity within patient's tolerance    Home Living                          Prior Function            PT Goals (current goals can now be found in the care plan section) Acute Rehab PT Goals Potential to  Achieve Goals: Fair Progress towards PT goals: Progressing toward goals    Frequency    Min 1X/week      PT Plan      Co-evaluation              AM-PAC PT "6 Clicks" Mobility   Outcome Measure  Help needed turning from your back to your side while in a flat bed without using bedrails?: A Little Help needed moving from lying on your back to sitting on the side of a flat bed without using bedrails?: A Lot Help needed moving to and from a bed to a chair (including a wheelchair)?: A Little Help needed standing up from a chair using your arms (e.g., wheelchair or bedside chair)?: A Little Help needed to walk in hospital room?: A Little Help needed climbing 3-5 steps with a railing? : Total 6 Click Score: 15    End of Session Equipment Utilized During Treatment: Gait belt Activity Tolerance: Patient tolerated treatment well Patient left: in chair;with call bell/phone within reach;with chair alarm set Nurse Communication: Mobility status PT Visit Diagnosis: Muscle weakness (generalized) (M62.81);Pain;Other abnormalities of gait and mobility (R26.89);Difficulty in walking, not elsewhere classified (R26.2) Pain - Right/Left: Left Pain - part of body: Leg;Shoulder;Ankle and joints of foot     Time: 6433-2951 PT Time Calculation (min) (ACUTE ONLY): 21 min  Charges:    $Therapeutic Activity: 8-22 mins PT General Charges $$ ACUTE PT VISIT: 1 Visit                     Lillia Pauls, PT, DPT Acute Rehabilitation Services Office [phone]    Norval Morton 09/27/2023, 11:27 AM

## 2023-09-27 NOTE — Assessment & Plan Note (Signed)
K 2.8 this morning. --potassium supplementation with tablet x3 today --AM BMP

## 2023-09-27 NOTE — Plan of Care (Signed)

## 2023-09-27 NOTE — Assessment & Plan Note (Signed)
Noted during OT on 11/7, TED hose ordered. Abdominal binder added 11/8. Most recent orthostatic vitals 152/68 lying, 132/55 sitting, 115/52 standing. --PT/OT eval and treat --continue to monitor

## 2023-09-27 NOTE — Progress Notes (Signed)
ID brief note  Afebrile     Latest Ref Rng & Units 09/27/2023    5:59 AM 09/25/2023    6:56 AM 09/24/2023    5:47 AM  CBC  WBC 4.0 - 10.5 K/uL 6.6  5.6  5.8   Hemoglobin 12.0 - 15.0 g/dL 7.4  7.7  7.4   Hematocrit 36.0 - 46.0 % 25.2  26.3  25.0   Platelets 150 - 400 K/uL 414  500  493       Latest Ref Rng & Units 09/27/2023    5:59 AM 09/25/2023    6:56 AM 09/21/2023    7:50 AM  CMP  Glucose 70 - 99 mg/dL 90  175  92   BUN 8 - 23 mg/dL 13  10  9    Creatinine 0.44 - 1.00 mg/dL 1.02  5.85  2.77   Sodium 135 - 145 mmol/L 145  144  142   Potassium 3.5 - 5.1 mmol/L 2.8  3.5  3.6   Chloride 98 - 111 mmol/L 115  115  111   CO2 22 - 32 mmol/L 22  23  20    Calcium 8.9 - 10.3 mg/dL 8.3  8.6  8.3    Results for orders placed or performed during the hospital encounter of 09/17/23  Resp panel by RT-PCR (RSV, Flu A&B, Covid) Anterior Nasal Swab     Status: None   Collection Time: 09/17/23 12:51 PM   Specimen: Anterior Nasal Swab  Result Value Ref Range Status   SARS Coronavirus 2 by RT PCR NEGATIVE NEGATIVE Final   Influenza A by PCR NEGATIVE NEGATIVE Final   Influenza B by PCR NEGATIVE NEGATIVE Final    Comment: (NOTE) The Xpert Xpress SARS-CoV-2/FLU/RSV plus assay is intended as an aid in the diagnosis of influenza from Nasopharyngeal swab specimens and should not be used as a sole basis for treatment. Nasal washings and aspirates are unacceptable for Xpert Xpress SARS-CoV-2/FLU/RSV testing.  Fact Sheet for Patients: BloggerCourse.com  Fact Sheet for Healthcare Providers: SeriousBroker.it  This test is not yet approved or cleared by the [place] FDA and has been authorized for detection and/or diagnosis of SARS-CoV-2 by FDA under an Emergency Use Authorization (EUA). This EUA will remain in effect (meaning this test can be used) for the duration of the COVID-19 declaration under Section 564(b)(1) of the Act, 21  U.S.C. section 360bbb-3(b)(1), unless the authorization is terminated or revoked.     Resp Syncytial Virus by PCR NEGATIVE NEGATIVE Final    Comment: (NOTE) Fact Sheet for Patients: BloggerCourse.com  Fact Sheet for Healthcare Providers: SeriousBroker.it  This test is not yet approved or cleared by the [place] FDA and has been authorized for detection and/or diagnosis of SARS-CoV-2 by FDA under an Emergency Use Authorization (EUA). This EUA will remain in effect (meaning this test can be used) for the duration of the COVID-19 declaration under Section 564(b)(1) of the Act, 21 U.S.C. section 360bbb-3(b)(1), unless the authorization is terminated or revoked.  Performed at [facility], [address], [place], [place] [address]   Blood Culture (routine x 2)     Status: None   Collection Time: 09/17/23 12:51 PM   Specimen: BLOOD  Result Value Ref Range Status   Specimen Description BLOOD RIGHT ANTECUBITAL  Final   Special Requests   Final    BOTTLES DRAWN AEROBIC AND ANAEROBIC Blood Culture adequate volume   Culture   Final    NO GROWTH 5 DAYS Performed  at [facility], [address] [place], [place] [address]    Report Status 09/22/2023 FINAL  Final  Blood Culture (routine x 2)     Status: None   Collection Time: 09/17/23 12:56 PM   Specimen: BLOOD RIGHT HAND  Result Value Ref Range Status   Specimen Description BLOOD RIGHT HAND  Final   Special Requests   Final    BOTTLES DRAWN AEROBIC ONLY Blood Culture results may not be optimal due to an inadequate volume of blood received in culture bottles   Culture   Final    NO GROWTH 5 DAYS Performed at [facility], [address], [place], [place] [address]    Report Status 09/22/2023 FINAL  Final  Surgical pcr screen     Status: None   Collection Time: 09/18/23  9:20 PM   Specimen: Nasal Mucosa; Nasal Swab  Result Value Ref Range Status   MRSA, PCR  NEGATIVE NEGATIVE Final   Staphylococcus aureus NEGATIVE NEGATIVE Final    Comment: (NOTE) The Xpert SA Assay (FDA approved for NASAL specimens in patients 90 years of age and older), is one component of a comprehensive surveillance program. It is not intended to diagnose infection nor to guide or monitor treatment. Performed at [facility], [address] [place], [place] [address]   MRSA Next Gen by PCR, Nasal     Status: None   Collection Time: 09/19/23 10:12 AM   Specimen: Nasal Mucosa; Nasal Swab  Result Value Ref Range Status   MRSA by PCR Next Gen NOT DETECTED NOT DETECTED Final    Comment: (NOTE) The GeneXpert MRSA Assay (FDA approved for NASAL specimens only), is one component of a comprehensive MRSA colonization surveillance program. It is not intended to diagnose MRSA infection nor to guide or monitor treatment for MRSA infections. Test performance is not FDA approved in patients less than 1 years old. Performed at [facility], [address] [place], [place] [address]   Aerobic/Anaerobic Culture w Gram Stain (surgical/deep wound)     Status: None (Preliminary result)   Collection Time: 09/25/23  4:12 PM   Specimen: PATH Soft tissue  Result Value Ref Range Status   Specimen Description TISSUE LEFT THIGH  Final   Special Requests NONE  Final   Gram Stain   Final    FEW WBC PRESENT,BOTH PMN AND MONONUCLEAR RARE GRAM POSITIVE COCCI IN PAIRS    Culture   Final    FEW PSEUDOMONAS AERUGINOSA SUSCEPTIBILITIES TO FOLLOW HOLDING FOR POSSIBLE ANAEROBE Performed at [facility], [address], [place], [place] [address]    Report Status PENDING  Incomplete   On linezolid and zosyn  Monitor CBC, surgical plans and cultures   Odette Fraction, MD Infectious Disease Physician [facility] [address] [place], [place] [address] Phone: [phone]  Fax: [phone]

## 2023-09-27 NOTE — Assessment & Plan Note (Signed)
Early acute osteomyelitis found on CT. Treatment plan as above.

## 2023-09-27 NOTE — Assessment & Plan Note (Signed)
Alzheimer's dementia. - Memantine 5 mg twice daily, Aricept 10 mg at bedtime - Daily precautions - Fall precautions

## 2023-09-28 DIAGNOSIS — M6080 Other myositis, unspecified site: Secondary | ICD-10-CM | POA: Diagnosis not present

## 2023-09-28 DIAGNOSIS — L97529 Non-pressure chronic ulcer of other part of left foot with unspecified severity: Secondary | ICD-10-CM | POA: Insufficient documentation

## 2023-09-28 DIAGNOSIS — M7989 Other specified soft tissue disorders: Secondary | ICD-10-CM | POA: Insufficient documentation

## 2023-09-28 DIAGNOSIS — F0390 Unspecified dementia without behavioral disturbance: Secondary | ICD-10-CM | POA: Diagnosis not present

## 2023-09-28 DIAGNOSIS — L89224 Pressure ulcer of left hip, stage 4: Secondary | ICD-10-CM | POA: Diagnosis not present

## 2023-09-28 LAB — BASIC METABOLIC PANEL
Anion gap: 7 (ref 5–15)
BUN: 17 mg/dL (ref 8–23)
CO2: 20 mmol/L — ABNORMAL LOW (ref 22–32)
Calcium: 8.7 mg/dL — ABNORMAL LOW (ref 8.9–10.3)
Chloride: 111 mmol/L (ref 98–111)
Creatinine, Ser: 0.99 mg/dL (ref 0.44–1.00)
GFR, Estimated: 59 mL/min — ABNORMAL LOW (ref >60)
Glucose, Bld: 107 mg/dL — ABNORMAL HIGH (ref 70–99)
Potassium: 3.5 mmol/L (ref 3.5–5.1)
Sodium: 138 mmol/L (ref 135–145)

## 2023-09-28 LAB — GLUCOSE, CAPILLARY
Glucose-Capillary: 85 mg/dL (ref 70–99)
Glucose-Capillary: 107 mg/dL — ABNORMAL HIGH (ref 70–99)
Glucose-Capillary: 133 mg/dL — ABNORMAL HIGH (ref 70–99)
Glucose-Capillary: 161 mg/dL — ABNORMAL HIGH (ref 70–99)
Glucose-Capillary: 117 mg/dL — ABNORMAL HIGH (ref 70–99)

## 2023-09-28 LAB — ACID FAST SMEAR (AFB, MYCOBACTERIA): Acid Fast Smear: NEGATIVE

## 2023-09-28 MED ORDER — WHITE PETROLATUM EX OINT
TOPICAL_OINTMENT | Freq: Two times a day (BID) | CUTANEOUS | Status: DC
  Administered 2023-09-28 – 2023-10-10 (×13): 0.2 via TOPICAL
  Administered 2023-10-13 (×2): 1 via TOPICAL
  Administered 2023-10-21 – 2023-10-23 (×4): 0.2 via TOPICAL
  Filled 2023-09-28 (×5): qty 28.35

## 2023-09-28 MED ORDER — SODIUM CHLORIDE 0.9 % IV SOLN
1.0000 g | Freq: Two times a day (BID) | INTRAVENOUS | Status: DC
  Administered 2023-09-28 – 2023-10-01 (×7): 1 g via INTRAVENOUS
  Filled 2023-09-28 (×8): qty 20

## 2023-09-28 NOTE — Assessment & Plan Note (Signed)
A1c 6.1 in Sept 24. Blood glucose has been well controlled and stable during hospital stay.  Will continue to monitor. - CBG monitoring AC/HS - Hold home metformin

## 2023-09-28 NOTE — Assessment & Plan Note (Addendum)
Following crush injury in September 2024. S/p I&D. Surgical wound cx gram stain with few pseudomonas and rare GPC. Pain is well controlled. - Plastic surgery following, appreciate recommendations  -dressing changed today  -plan to return to the OR 11/20 --ID recommendations as below  -continue linezolid pending GPC ID  -switch to meropenem today   -will follow up after next surgery - Scheduled tylenol 650mg  q6h for pain --AM CBC, BMP - TOC consult, anticipated SNF at discharge

## 2023-09-28 NOTE — Progress Notes (Signed)
Daily Progress Note Intern Pager: [phone]  Patient name: Elizabeth Schwartz Medical record number: [id number] Date of birth: 07-Jan-1946 Age: 77 y.o. Gender: female  Primary Care Provider: Storm Frisk, MD Consultants: plastic surgery Code Status: full  Pt Overview and Major Events to Date:  11/3: admitted to [facility]MT[facility] 11/11: I&D L thigh  Assessment and Plan: Elizabeth Schwartz is a 77 year old female admitted for L thigh wound after crush injury with imaging concerning for necrotizing myositis and early osteomyelitis. Surgical treatment was delayed due to concern that pt lacked capacity to consent, pt was later deemed to have capacity per psychiatry assessment. S/p I&D, pending more wound cultures data for ABX planning.   Pertinent PMH/PSH includes HLD, schizophrenia, COPD, osteoporosis.  Assessment & Plan Necrotizing myositis Following crush injury in September 2024. S/p I&D. Surgical wound cx gram stain with few pseudomonas and rare GPC. Pain is well controlled. - Plastic surgery following, appreciate recommendations  -dressing changed today  -plan to return to the OR 11/20 --ID recommendations as below  -continue linezolid pending GPC ID  -switch to meropenem today   -will follow up after next surgery - Scheduled tylenol 650mg  q6h for pain --AM CBC, BMP - TOC consult, anticipated SNF at discharge Osteomyelitis of left femur (HCC) Early acute osteomyelitis found on CT. Treatment plan as above. Skin ulcer of left great toe (HCC) Noted on admission, appears stable on exam today. BL feet also with dry skin. -vaseline to feet BID Left leg swelling L foot and lower leg with ongoing edema present since admission now noted to have warmth, erythema, and tenderness. Low concern for infection as pt already on broad spectrum ABX. Also possible that pt has DVT, though has been on daily lovenox. Most likely that this is sequalae of L thigh injury and surgery. -continue to monitor -consider  DVT doppler study LLE if symptoms worsen Diabetes mellitus without complication (HCC) A1c 6.1 in Sept 24. Blood glucose has been well controlled and stable during hospital stay.  Will continue to monitor. - CBG monitoring AC/HS - Hold home metformin Dementia (HCC) Alzheimer's dementia. - Memantine 5 mg twice daily, Aricept 10 mg at bedtime - Delirium precautions - Fall precautions Anemia Normocytic anemia.  Hemoglobin has been stable between 7-8.  Suspect component of anemia of chronic disease.  No evidence of active bleeding. - continue to monitor Orthostatic hypotension Noted during OT on 11/7, TED hose ordered. Abdominal binder added 11/8. Most recent orthostatic vitals 152/68 lying, 132/55 sitting, 115/52 standing. --PT/OT eval and treat --continue to monitor Goals of care, counseling/discussion Pt requested DNR status on admission, however had questionable capacity to make medical decisions and was made full code by default. Evaluated by psychiatry and deemed to have capacity to make a decision about surgery, specifically can make task specific medical decisions. Discussed code status with pt today, pt able to state that she could die if CPR is not done, but unable to make a clear decision about code status. --continue full code status  --continue to discuss code status with patient and assess for understanding --determine if pt has any family or friends who can help make decisions for her Hypokalemia (Resolved: 09/28/2023) K 3.5 this morning.   Chronic and Stable Issues: HLD-Have not started home Zetia 10 mg daily, fenofibrate 160 mg daily (unclear if taking) Schizophrenia-continue home clozapine 200 mg at bedtime  COPD-Continue Symbicort 2 puffs twice daily, albuterol as needed Osteoporosis-Have not ordered alendronate 70 mg once a week  on Wednesdays per chart review  FEN/GI: regular PPx: lovenox Dispo: pending clinical improvement  Subjective:  Pt states she is having  some pain in her L foot today. Denies pain elsewhere. Discussed code status with patient, stated she would want "whatever you think is right" in terms of CPR. Was able to state that she "could die" without CPR.  Objective: Temp:  [98 F (36.7 C)-99 F (37.2 C)] 98 F (36.7 C) (11/14 0442) Pulse Rate:  [80-87] 84 (11/14 0442) Resp:  [16-18] 16 (11/14 0442) BP: (137-142)/(60-70) 142/70 (11/14 0442) SpO2:  [98 %-99 %] 99 % (11/14 0442) Physical Exam: General: sitting up in bed, awake and alert, in NAD Cardiovascular: RRR, normal S1/S2 Respiratory: CTAB, normal WOB on RA Abdomen: normoactive bowel sounds, soft, nontender, nondistended Extremities: L great toe with 2cm eschar, dry skin (see photo), L thigh with clean, dry dressing, non-pitting edema, diffuse erythema, and warmth to L leg inferior to dressing  Laboratory: Most recent CBC Lab Results  Component Value Date   WBC 6.6 09/27/2023   HGB 7.4 (L) 09/27/2023   HCT 25.2 (L) 09/27/2023   MCV 92.6 09/27/2023   PLT 414 (H) 09/27/2023   Most recent BMP    Latest Ref Rng & Units 09/27/2023    5:59 AM  BMP  Glucose 70 - 99 mg/dL 90   BUN 8 - 23 mg/dL 13   Creatinine 3.24 - 1.00 mg/dL 4.01   Sodium 027 - 253 mmol/L 145   Potassium 3.5 - 5.1 mmol/L 2.8   Chloride 98 - 111 mmol/L 115   CO2 22 - 32 mmol/L 22   Calcium 8.9 - 10.3 mg/dL 8.3      Elizabeth Schwartz, Tacey Ruiz, MD 09/28/2023, 7:48 AM  PGY-1, [facility] FPTS Intern pager: [phone], text pages welcome Secure chat group [facility] Teaching Service

## 2023-09-28 NOTE — Progress Notes (Signed)
Occupational Therapy Treatment Patient Details Name: Elizabeth Schwartz MRN: [id number] DOB: 14-Mar-1946 Today's Date: 09/28/2023   History of present illness Pt is a 77 y/o F admitted on 09/17/23 after being advised by [facility] nurse to go to the ED after assessing pt's wound. CT showed necrotic myositis, pt also found to have acute osteomyelitis. Pt found to have symptomatic orthostatic hypotension during therapies 11/6-11/8.  S/P I&D 11/11. PMH: anemia, COPD, dementia, depression, DM, HLD, HTN, osteoporosis, schizo-affective, SOB, syncope.   OT comments  Patient supine in bed and agreeable to OT.  Completing bed mobility with supervision, LB dressing with min assist, transfers with min assist using RW, and functional mobility with min assist using RW to recliner.  She requires cueing for safety and problem solving. Scored 17/28 on short blessed test, known hx of dementia with improved cognition from last session but remains confused. Will follow acutely, continued to recommend <3hrs/day inpatient setting at dc.       If plan is discharge home, recommend the following:  A little help with walking and/or transfers;Assistance with cooking/housework;Direct supervision/assist for financial management;Direct supervision/assist for medications management;Assist for transportation;Help with stairs or ramp for entrance;A little help with bathing/dressing/bathroom   Equipment Recommendations  Other (comment) (defer)    Recommendations for Other Services      Precautions / Restrictions Precautions Precautions: Fall Precaution Comments: watch BP Restrictions Weight Bearing Restrictions: No       Mobility Bed Mobility Overal bed mobility: Needs Assistance Bed Mobility: Supine to Sit Rolling: Supervision         General bed mobility comments: increased time and verbal cues    Transfers Overall transfer level: Needs assistance Equipment used: Rolling walker (2 wheels) Transfers: Sit to/from  Stand Sit to Stand: Min assist           General transfer comment: cueing for hand placement and safety     Balance Overall balance assessment: Needs assistance Sitting-balance support: Feet supported Sitting balance-Leahy Scale: Fair Sitting balance - Comments: supervision static sitting   Standing balance support: Bilateral upper extremity supported, During functional activity, Single extremity supported Standing balance-Leahy Scale: Poor Standing balance comment: relies on UE and external support                           ADL either performed or assessed with clinical judgement   ADL Overall ADL's : Needs assistance/impaired     Grooming: Set up;Wash/dry face;Sitting               Lower Body Dressing: Minimal assistance;Sit to/from stand Lower Body Dressing Details (indicate cue type and reason): assist for L sock, min guard in standing Toilet Transfer: Minimal assistance;Rolling walker (2 wheels);Ambulation Toilet Transfer Details (indicate cue type and reason): to recliner         Functional mobility during ADLs: Minimal assistance;Rolling walker (2 wheels)      Extremity/Trunk Assessment Upper Extremity Assessment Upper Extremity Assessment: LUE deficits/detail LUE Deficits / Details: L shoulder chronic pain and limited ROM- pt reports "it is worse when it rains"            Vision       Perception     Praxis      Cognition Arousal: Alert Behavior During Therapy: Flat affect Overall Cognitive Status: History of cognitive impairments - at baseline  General Comments: cognition much clearer today, but reports 2014.  Short blessed test with deficits in time orientation (reports 4pm even though she says lunch is coming), recall, sequencing and attentino scoring 17/28 (signficant impairments)        Exercises      Shoulder Instructions       General Comments pt reports "woozy" when OOB  but does not worsen with standing.  SBP stable.    Pertinent Vitals/ Pain       Pain Assessment Pain Assessment: Faces Faces Pain Scale: Hurts even more Pain Location: L thigh Pain Descriptors / Indicators: Grimacing, Discomfort, Operative site guarding Pain Intervention(s): Limited activity within patient's tolerance, Monitored during session, Repositioned  Home Living                                          Prior Functioning/Environment              Frequency  Min 1X/week        Progress Toward Goals  OT Goals(current goals can now be found in the care plan section)  Progress towards OT goals: Progressing toward goals  Acute Rehab OT Goals Patient Stated Goal: get better OT Goal Formulation: With patient Time For Goal Achievement: 10/03/23 Potential to Achieve Goals: Fair  Plan      Co-evaluation                 AM-PAC OT "6 Clicks" Daily Activity     Outcome Measure   Help from another person eating meals?: A Little Help from another person taking care of personal grooming?: A Little Help from another person toileting, which includes using toliet, bedpan, or urinal?: A Lot Help from another person bathing (including washing, rinsing, drying)?: A Lot Help from another person to put on and taking off regular upper body clothing?: A Little Help from another person to put on and taking off regular lower body clothing?: A Lot 6 Click Score: 15    End of Session Equipment Utilized During Treatment: Gait belt;Rolling walker (2 wheels)  OT Visit Diagnosis: Other abnormalities of gait and mobility (R26.89);Muscle weakness (generalized) (M62.81);Pain;Other symptoms and signs involving cognitive function Pain - Right/Left: Left Pain - part of body: Leg   Activity Tolerance Patient tolerated treatment well   Patient Left in chair;with call bell/phone within reach;with chair alarm set   Nurse Communication Mobility status         Time: 1209-1228 OT Time Calculation (min): 19 min  Charges: OT General Charges $OT Visit: 1 Visit OT Treatments $Self Care/Home Management : 8-22 mins  Barry Brunner, OT Acute Rehabilitation Services Office [phone]   Chancy Milroy 09/28/2023, 2:02 PM

## 2023-09-28 NOTE — Assessment & Plan Note (Addendum)
L foot and lower leg with ongoing edema present since admission now noted to have warmth, erythema, and tenderness. Low concern for infection as pt already on broad spectrum ABX. Also possible that pt has DVT, though has been on daily lovenox. Most likely that this is sequalae of L thigh injury and surgery. -continue to monitor -consider DVT doppler study LLE if symptoms worsen

## 2023-09-28 NOTE — Assessment & Plan Note (Signed)
Noted on admission, appears stable on exam today. BL feet also with dry skin. -vaseline to feet BID

## 2023-09-28 NOTE — Assessment & Plan Note (Signed)
Early acute osteomyelitis found on CT. Treatment plan as above.

## 2023-09-28 NOTE — TOC Progression Note (Addendum)
Transition of Care [facility]) - Progression Note    Patient Details  Name: Elizabeth Schwartz MRN: [id number] Date of Birth: Mar 12, 1946  Transition of Care [facility]) CM/SW Contact  Lorri Frederick, LCSW Phone Number: 09/28/2023, 9:57 AM  Clinical Narrative:   CSW called by state SNF investigator Elizabeth Schwartz, [phone], requesting additional information for the report, which was provided.   1600: CSW LM with [name].   Expected Discharge Plan: Skilled Nursing Facility Barriers to Discharge: SNF Pending bed offer  Expected Discharge Plan and Services In-house Referral: Clinical Social Work   Post Acute Care Choice: Skilled Nursing Facility Living arrangements for the past 2 months: Single Family Home                                       Social Determinants of Health (SDOH) Interventions SDOH Screenings   Food Insecurity: No Food Insecurity (09/18/2023)  Housing: Patient Unable To Answer (09/18/2023)  Transportation Needs: No Transportation Needs (09/18/2023)  Utilities: Not At Risk (09/18/2023)  Alcohol Screen: Low Risk  (03/22/2023)  Depression (PHQ2-9): Low Risk  (07/13/2023)  Financial Resource Strain: Low Risk  (03/22/2023)  Physical Activity: Insufficiently Active (03/22/2023)  Social Connections: Socially Isolated (03/22/2023)  Stress: No Stress Concern Present (03/22/2023)  Tobacco Use: Medium Risk (09/25/2023)    Readmission Risk Interventions     No data to display

## 2023-09-28 NOTE — Assessment & Plan Note (Signed)
Normocytic anemia.  Hemoglobin has been stable between 7-8.  Suspect component of anemia of chronic disease.  No evidence of active bleeding. - continue to monitor

## 2023-09-28 NOTE — Assessment & Plan Note (Addendum)
K 3.5 this morning.

## 2023-09-28 NOTE — Assessment & Plan Note (Addendum)
Pt requested DNR status on admission, however had questionable capacity to make medical decisions and was made full code by default. Evaluated by psychiatry and deemed to have capacity to make a decision about surgery, specifically can make task specific medical decisions. Discussed code status with pt today, pt able to state that she could die if CPR is not done, but unable to make a clear decision about code status. --continue full code status  --continue to discuss code status with patient and assess for understanding --determine if pt has any family or friends who can help make decisions for her

## 2023-09-28 NOTE — Assessment & Plan Note (Signed)
Alzheimer's dementia. - Memantine 5 mg twice daily, Aricept 10 mg at bedtime - Delirium precautions - Fall precautions

## 2023-09-28 NOTE — Care Management Important Message (Signed)
Important Message  Patient Details  Name: Elizabeth Schwartz MRN: [id number] Date of Birth: [birth year]   Important Message Given:  Yes - Medicare IM     Sherilyn Banker 09/28/2023, 3:16 PM

## 2023-09-28 NOTE — Assessment & Plan Note (Signed)
Noted during OT on 11/7, TED hose ordered. Abdominal binder added 11/8. Most recent orthostatic vitals 152/68 lying, 132/55 sitting, 115/52 standing. --PT/OT eval and treat --continue to monitor

## 2023-09-28 NOTE — Plan of Care (Signed)
  Problem: Education: Goal: Ability to describe self-care measures that may prevent or decrease complications (Diabetes Survival Skills Education) will improve Outcome: Progressing   Problem: Coping: Goal: Ability to adjust to condition or change in health will improve Outcome: Progressing   Problem: Nutritional: Goal: Maintenance of adequate nutrition will improve Outcome: Progressing   Problem: Safety: Goal: Ability to remain free from injury will improve Outcome: Progressing

## 2023-09-28 NOTE — Progress Notes (Signed)
[facility] Infectious Diseases Follow Up Note  Patient Identification: Patient Name: Elizabeth Schwartz MRN: [id number] Admit Date: 09/17/2023 12:06 PM Age: 77 y.o.Today's Date: 09/28/2023  Reason for Visit: deep soft tissue infection, osteomyelitis   Principal Problem:   Pressure injury of left thigh, stage 4 (HCC) Active Problems:   Anemia   Long term current use of clozapine   Myositis of left thigh   Osteomyelitis of left femur (HCC)   Necrotizing myositis   Diabetes mellitus without complication (HCC)   Dementia (HCC)   Orthostatic hypotension   Goals of care, counseling/discussion   Hypokalemia   Antibiotics: Linezolid 11/4- Zosyn 11/3- Total days of antibiotics 12  Lines/Hardwares:   Interval Events: Remains afebrile, labs not available today. Dressing change of wound rescheduled to today per patient   Assessment 77 year old female with h/o DM2,  dementia who presented to ED on 7/3 for worsening wound in the left inner thigh with deep, purulent and foul-smelling drainage. Admitted with   # Necrotizing myositis of medial left thigh/deep soft tissue wound # Possible acute early osteomyelitis of the mid left femur - Recently admitted 9/25-10/4 when she presented with fall, neighbors found her pinned to the ground with stove on top of her, found to have compressive injury to left thigh/rhabdomyolysis. Seen by Ortho and neuro last admission with improvement of swelling and movement on Left lower extremity prior to discharge.  11/11 s/p debridement of wound, placement of myriad tissue matrix . OR cx GPC in pairs , cx with PsA ( MIC to zosyn is 64)  Recommendations - continue linezolid pending ID of GPC, will switch zosyn to meropenem today  - Plan for next OR noted on 11/20.  - Monitor CBC, CMP  - Following cultures peripherally until next OR.   Rest of the management as per the primary team. Thank you for the  consult. Please page with pertinent questions or concerns.  ______________________________________________________________________ Subjective patient seen at bedside. No complaints, Reports her thigh wound is deep   Elderly female sitting in the bed and appears comfortable HEENT WNL Heart- RRR Lung-normal breath sounds Abdomen-soft, nondistended and nontender Extremity-no pedal edema, left thigh and knee is bandaged with no surrounding cellulitis.  Bandage isC/D/I  Per surgery wound exam " There is some yellow exudate noted in the wound, but otherwise wound appears to be clean with healthy appearing tissue. Brock Ra is in place. This was dressed with K-Y jelly, Vashe soaked gauze, ABD pad, Kerlix and Ace wrap "  Vitals BP (!) 142/70 (BP Location: Left Arm)   Pulse 84   Temp 98 F (36.7 C)   Resp 16   Ht 5\' 5"  (1.651 m)   Wt 74.8 kg   SpO2 99%   BMI 27.46 kg/m   Pertinent Microbiology Results for orders placed or performed during the hospital encounter of 09/17/23  Resp panel by RT-PCR (RSV, Flu A&B, Covid) Anterior Nasal Swab     Status: None   Collection Time: 09/17/23 12:51 PM   Specimen: Anterior Nasal Swab  Result Value Ref Range Status   SARS Coronavirus 2 by RT PCR NEGATIVE NEGATIVE Final   Influenza A by PCR NEGATIVE NEGATIVE Final   Influenza B by PCR NEGATIVE NEGATIVE Final    Comment: (NOTE) The Xpert Xpress SARS-CoV-2/FLU/RSV plus assay is intended as an aid in the diagnosis of influenza from Nasopharyngeal swab specimens and should not be used as a sole basis for treatment. Nasal washings and aspirates are unacceptable for Xpert  Xpress SARS-CoV-2/FLU/RSV testing.  Fact Sheet for Patients: BloggerCourse.com  Fact Sheet for Healthcare Providers: SeriousBroker.it  This test is not yet approved or cleared by the [place] FDA and has been authorized for detection and/or diagnosis of SARS-CoV-2 by FDA  under an Emergency Use Authorization (EUA). This EUA will remain in effect (meaning this test can be used) for the duration of the COVID-19 declaration under Section 564(b)(1) of the Act, 21 U.S.C. section 360bbb-3(b)(1), unless the authorization is terminated or revoked.     Resp Syncytial Virus by PCR NEGATIVE NEGATIVE Final    Comment: (NOTE) Fact Sheet for Patients: BloggerCourse.com  Fact Sheet for Healthcare Providers: SeriousBroker.it  This test is not yet approved or cleared by the [place] FDA and has been authorized for detection and/or diagnosis of SARS-CoV-2 by FDA under an Emergency Use Authorization (EUA). This EUA will remain in effect (meaning this test can be used) for the duration of the COVID-19 declaration under Section 564(b)(1) of the Act, 21 U.S.C. section 360bbb-3(b)(1), unless the authorization is terminated or revoked.  Performed at [facility], [address] [place], [place] [address]   Blood Culture (routine x 2)     Status: None   Collection Time: 09/17/23 12:51 PM   Specimen: BLOOD  Result Value Ref Range Status   Specimen Description BLOOD RIGHT ANTECUBITAL  Final   Special Requests   Final    BOTTLES DRAWN AEROBIC AND ANAEROBIC Blood Culture adequate volume   Culture   Final    NO GROWTH 5 DAYS Performed at [facility], [address], [place], [place] [address]    Report Status 09/22/2023 FINAL  Final  Blood Culture (routine x 2)     Status: None   Collection Time: 09/17/23 12:56 PM   Specimen: BLOOD RIGHT HAND  Result Value Ref Range Status   Specimen Description BLOOD RIGHT HAND  Final   Special Requests   Final    BOTTLES DRAWN AEROBIC ONLY Blood Culture results may not be optimal due to an inadequate volume of blood received in culture bottles   Culture   Final    NO GROWTH 5 DAYS Performed at [facility], [address], [place], [place] [address]     Report Status 09/22/2023 FINAL  Final  Surgical pcr screen     Status: None   Collection Time: 09/18/23  9:20 PM   Specimen: Nasal Mucosa; Nasal Swab  Result Value Ref Range Status   MRSA, PCR NEGATIVE NEGATIVE Final   Staphylococcus aureus NEGATIVE NEGATIVE Final    Comment: (NOTE) The Xpert SA Assay (FDA approved for NASAL specimens in patients 95 years of age and older), is one component of a comprehensive surveillance program. It is not intended to diagnose infection nor to guide or monitor treatment. Performed at [facility], [address], [place], [place] [address]   MRSA Next Gen by PCR, Nasal     Status: None   Collection Time: 09/19/23 10:12 AM   Specimen: Nasal Mucosa; Nasal Swab  Result Value Ref Range Status   MRSA by PCR Next Gen NOT DETECTED NOT DETECTED Final    Comment: (NOTE) The GeneXpert MRSA Assay (FDA approved for NASAL specimens only), is one component of a comprehensive MRSA colonization surveillance program. It is not intended to diagnose MRSA infection nor to guide or monitor treatment for MRSA infections. Test performance is not FDA approved in patients less than 14 years old. Performed  at [facility], [address] [place], [place] [address]   Aerobic/Anaerobic Culture w Gram Stain (surgical/deep wound)     Status: None (Preliminary result)   Collection Time: 09/25/23  4:12 PM   Specimen: PATH Soft tissue  Result Value Ref Range Status   Specimen Description TISSUE LEFT THIGH  Final   Special Requests NONE  Final   Gram Stain   Final    FEW WBC PRESENT,BOTH PMN AND MONONUCLEAR RARE GRAM POSITIVE COCCI IN PAIRS    Culture   Final    FEW PSEUDOMONAS AERUGINOSA SUSCEPTIBILITIES TO FOLLOW HOLDING FOR POSSIBLE ANAEROBE Performed at [facility], [address], [place], [place] [address]    Report Status PENDING  Incomplete   Pertinent Lab.    Latest Ref Rng & Units 09/27/2023    5:59 AM 09/25/2023    6:56 AM 09/24/2023     5:47 AM  CBC  WBC 4.0 - 10.5 K/uL 6.6  5.6  5.8   Hemoglobin 12.0 - 15.0 g/dL 7.4  7.7  7.4   Hematocrit 36.0 - 46.0 % 25.2  26.3  25.0   Platelets 150 - 400 K/uL 414  500  493       Latest Ref Rng & Units 09/27/2023    5:59 AM 09/25/2023    6:56 AM 09/21/2023    7:50 AM  CMP  Glucose 70 - 99 mg/dL 90  324  92   BUN 8 - 23 mg/dL 13  10  9    Creatinine 0.44 - 1.00 mg/dL 4.01  0.27  2.53   Sodium 135 - 145 mmol/L 145  144  142   Potassium 3.5 - 5.1 mmol/L 2.8  3.5  3.6   Chloride 98 - 111 mmol/L 115  115  111   CO2 22 - 32 mmol/L 22  23  20    Calcium 8.9 - 10.3 mg/dL 8.3  8.6  8.3      Pertinent Imaging today Plain films and CT images have been personally visualized and interpreted; radiology reports have been reviewed. Decision making incorporated into the Impression  No results found.  I have personally spent 35 minutes involved in face-to-face and non-face-to-face activities for this patient on the day of the visit. Professional time spent includes the following activities: Preparing to see the patient (review of tests), Obtaining and/or reviewing separately obtained history (admission/discharge record), Performing a medically appropriate examination and/or evaluation , Ordering medications/tests/procedures, referring and communicating with other health care professionals, Documenting clinical information in the EMR, Independently interpreting results (not separately reported), Communicating results to the patient/family/caregiver, Counseling and educating the patient/family/caregiver and Care coordination (not separately reported).   Plan d/w requesting provider as well as ID pharm D  Of note, portions of this note may have been created with voice recognition software. While this note has been edited for accuracy, occasional wrong-word or 'sound-a-like' substitutions may have occurred due to the inherent limitations of voice recognition software.   Electronically signed by:    Odette Fraction, MD Infectious Disease Physician [facility] Infectious Disease Pager: [phone]

## 2023-09-28 NOTE — Progress Notes (Signed)
3 Days Post-Op  Subjective: Patient is a 77 year old female who underwent debridement of necrotic medial thigh wound of the left leg and placement of myriad wound matrix with Dr. Ladona Ridgel on 09/25/2023.  She is 3 days postop.  Today, patient is resting comfortably in bed upon entering the room.  She states that she is doing well.  She does not have any complaints or concerns at this time.  Denies any pain to her left lower extremity.  Per nursing, patient did remove some dressings from her left lower extremity last night.  Objective: Vital signs in last 24 hours: Temp:  [98 F (36.7 C)-99 F (37.2 C)] 98.4 F (36.9 C) (11/14 1011) Pulse Rate:  [80-84] 84 (11/14 1011) Resp:  [16-18] 18 (11/14 1011) BP: (136-142)/(50-70) 136/50 (11/14 1011) SpO2:  [96 %-99 %] 96 % (11/14 1011) Last BM Date : 09/27/23  Intake/Output from previous day: No intake/output data recorded. Intake/Output this shift: No intake/output data recorded.  General appearance: cooperative and no distress Resp: Unlabored breathing, no respiratory distress Extremities: Dressings removed from medial thigh wound.  There is some yellow exudate noted in the wound, but otherwise wound appears to be clean with healthy appearing tissue.  Brock Ra is in place.  This was dressed with K-Y jelly, Vashe soaked gauze, ABD pad, Kerlix and Ace wrap.  Patient tolerated well.  Lab Results:     Latest Ref Rng & Units 09/27/2023    5:59 AM 09/25/2023    6:56 AM 09/24/2023    5:47 AM  CBC  WBC 4.0 - 10.5 K/uL 6.6  5.6  5.8   Hemoglobin 12.0 - 15.0 g/dL 7.4  7.7  7.4   Hematocrit 36.0 - 46.0 % 25.2  26.3  25.0   Platelets 150 - 400 K/uL 414  500  493     BMET Recent Labs    09/27/23 0559  NA 145  K 2.8*  CL 115*  CO2 22  GLUCOSE 90  BUN 13  CREATININE 1.00  CALCIUM 8.3*   PT/INR No results for input(s): "LABPROT", "INR" in the last 72 hours. ABG No results for input(s): "PHART", "HCO3" in the last 72  hours.  Invalid input(s): "PCO2", "PO2"  Studies/Results: No results found.  Anti-infectives: Anti-infectives (From admission, onward)    Start     Dose/Rate Route Frequency Ordered Stop   09/28/23 1300  meropenem (MERREM) 1 g in sodium chloride 0.9 % 100 mL IVPB        1 g 200 mL/hr over 30 Minutes Intravenous Every 12 hours 09/28/23 0956     09/18/23 1600  vancomycin (VANCOCIN) IVPB 1000 mg/200 mL premix  Status:  Discontinued        1,000 mg 200 mL/hr over 60 Minutes Intravenous Every 24 hours 09/17/23 1904 09/18/23 1402   09/18/23 1500  linezolid (ZYVOX) tablet 600 mg        600 mg Oral Every 12 hours 09/18/23 1402     09/17/23 2200  piperacillin-tazobactam (ZOSYN) IVPB 3.375 g  Status:  Discontinued        3.375 g 12.5 mL/hr over 240 Minutes Intravenous Every 8 hours 09/17/23 1904 09/28/23 0956   09/17/23 1545  piperacillin-tazobactam (ZOSYN) IVPB 3.375 g        3.375 g 100 mL/hr over 30 Minutes Intravenous  Once 09/17/23 1531 09/17/23 1701   09/17/23 1300  vancomycin (VANCOREADY) IVPB 1500 mg/300 mL        1,500 mg 150 mL/hr over 120 Minutes  Intravenous  Once 09/17/23 1253 09/17/23 1615       Assessment/Plan: s/p Procedure(s): IRRIGATION AND DEBRIDEMENT  THIGH WOUND Plan: -Patient doing well status postdebridement.  Continue daily dressing changes with K-Y jelly over the Sorbact, Vashe soaked gauze, ABD pad, Kerlix and Ace wrap. -Will plan to take the patient back to the OR on 10/04/2023 for further evaluation of the wound under anesthesia. -Did discuss with the patient the importance of leaving her dressings in place.  She expressed understanding.  LOS: 11 days    Laurena Spies, PA-C 09/28/2023

## 2023-09-29 DIAGNOSIS — M86152 Other acute osteomyelitis, left femur: Secondary | ICD-10-CM | POA: Diagnosis not present

## 2023-09-29 DIAGNOSIS — D649 Anemia, unspecified: Secondary | ICD-10-CM

## 2023-09-29 DIAGNOSIS — L89224 Pressure ulcer of left hip, stage 4: Secondary | ICD-10-CM | POA: Diagnosis not present

## 2023-09-29 DIAGNOSIS — M6080 Other myositis, unspecified site: Secondary | ICD-10-CM | POA: Diagnosis not present

## 2023-09-29 LAB — CBC
HCT: 25.7 % — ABNORMAL LOW (ref 36.0–46.0)
Hemoglobin: 7.7 g/dL — ABNORMAL LOW (ref 12.0–15.0)
MCH: 27.3 pg (ref 26.0–34.0)
MCHC: 30 g/dL (ref 30.0–36.0)
MCV: 91.1 fL (ref 80.0–100.0)
Platelets: 362 10*3/uL (ref 150–400)
RBC: 2.82 MIL/uL — ABNORMAL LOW (ref 3.87–5.11)
RDW: 17.6 % — ABNORMAL HIGH (ref 11.5–15.5)
WBC: 5.9 10*3/uL (ref 4.0–10.5)
nRBC: 0 % (ref 0.0–0.2)

## 2023-09-29 LAB — BASIC METABOLIC PANEL
Anion gap: 8 (ref 5–15)
BUN: 19 mg/dL (ref 8–23)
CO2: 21 mmol/L — ABNORMAL LOW (ref 22–32)
Calcium: 8.8 mg/dL — ABNORMAL LOW (ref 8.9–10.3)
Chloride: 112 mmol/L — ABNORMAL HIGH (ref 98–111)
Creatinine, Ser: 0.8 mg/dL (ref 0.44–1.00)
GFR, Estimated: >60 mL/min (ref >60)
Glucose, Bld: 141 mg/dL — ABNORMAL HIGH (ref 70–99)
Potassium: 3.5 mmol/L (ref 3.5–5.1)
Sodium: 141 mmol/L (ref 135–145)

## 2023-09-29 LAB — AEROBIC/ANAEROBIC CULTURE W GRAM STAIN (SURGICAL/DEEP WOUND)

## 2023-09-29 LAB — GLUCOSE, CAPILLARY
Glucose-Capillary: 131 mg/dL — ABNORMAL HIGH (ref 70–99)
Glucose-Capillary: 122 mg/dL — ABNORMAL HIGH (ref 70–99)
Glucose-Capillary: 110 mg/dL — ABNORMAL HIGH (ref 70–99)

## 2023-09-29 NOTE — Assessment & Plan Note (Signed)
 Early acute osteomyelitis found on CT. Treatment plan as above.

## 2023-09-29 NOTE — Assessment & Plan Note (Addendum)
Noted on admission, appears stable on exam today. BL feet also with dry skin. -vaseline to feet BID -continue to monitor

## 2023-09-29 NOTE — Assessment & Plan Note (Addendum)
Following crush injury in September 2024. S/p I&D. Surgical wound cx gram stain with few pseudomonas, enterococcus faecalis, bacteroides distasonis. Pain is well controlled. - Plastic surgery following, appreciate recommendations  -dressing changed 11/14  -plan to return to the OR 11/20 --ID following --on Linezolid and Meropenem currently -- appreciate updated ID recommendations based on current culture data - Scheduled tylenol 650mg  q6h for pain --AM CBC, BMP - TOC consult, anticipated SNF at discharge

## 2023-09-29 NOTE — Assessment & Plan Note (Addendum)
A1c 6.1 in Sept 24. Blood glucose has been well controlled and stable during hospital stay.  Will continue to monitor. -hold home metformin

## 2023-09-29 NOTE — Assessment & Plan Note (Signed)
 Alzheimer's dementia. - Memantine 5 mg twice daily, Aricept 10 mg at bedtime - Delirium precautions - Fall precautions

## 2023-09-29 NOTE — Progress Notes (Signed)
ID brief note   Afebrile     Latest Ref Rng & Units 09/29/2023    6:35 AM 09/27/2023    5:59 AM 09/25/2023    6:56 AM  CBC  WBC 4.0 - 10.5 K/uL 5.9  6.6  5.6   Hemoglobin 12.0 - 15.0 g/dL 7.7  7.4  7.7   Hematocrit 36.0 - 46.0 % 25.7  25.2  26.3   Platelets 150 - 400 K/uL 362  414  500       Latest Ref Rng & Units 09/29/2023    6:35 AM 09/28/2023    9:37 AM 09/27/2023    5:59 AM  CMP  Glucose 70 - 99 mg/dL 782  956  90   BUN 8 - 23 mg/dL 19  17  13    Creatinine 0.44 - 1.00 mg/dL 2.13  0.86  5.78   Sodium 135 - 145 mmol/L 141  138  145   Potassium 3.5 - 5.1 mmol/L 3.5  3.5  2.8   Chloride 98 - 111 mmol/L 112  111  115   CO2 22 - 32 mmol/L 21  20  22    Calcium 8.9 - 10.3 mg/dL 8.8  8.7  8.3    Results for orders placed or performed during the hospital encounter of 09/17/23  Resp panel by RT-PCR (RSV, Flu A&B, Covid) Anterior Nasal Swab     Status: None   Collection Time: 09/17/23 12:51 PM   Specimen: Anterior Nasal Swab  Result Value Ref Range Status   SARS Coronavirus 2 by RT PCR NEGATIVE NEGATIVE Final   Influenza A by PCR NEGATIVE NEGATIVE Final   Influenza B by PCR NEGATIVE NEGATIVE Final    Comment: (NOTE) The Xpert Xpress SARS-CoV-2/FLU/RSV plus assay is intended as an aid in the diagnosis of influenza from Nasopharyngeal swab specimens and should not be used as a sole basis for treatment. Nasal washings and aspirates are unacceptable for Xpert Xpress SARS-CoV-2/FLU/RSV testing.  Fact Sheet for Patients: BloggerCourse.com  Fact Sheet for Healthcare Providers: SeriousBroker.it  This test is not yet approved or cleared by the [place] FDA and has been authorized for detection and/or diagnosis of SARS-CoV-2 by FDA under an Emergency Use Authorization (EUA). This EUA will remain in effect (meaning this test can be used) for the duration of the COVID-19 declaration under Section 564(b)(1) of the Act, 21  U.S.C. section 360bbb-3(b)(1), unless the authorization is terminated or revoked.     Resp Syncytial Virus by PCR NEGATIVE NEGATIVE Final    Comment: (NOTE) Fact Sheet for Patients: BloggerCourse.com  Fact Sheet for Healthcare Providers: SeriousBroker.it  This test is not yet approved or cleared by the [place] FDA and has been authorized for detection and/or diagnosis of SARS-CoV-2 by FDA under an Emergency Use Authorization (EUA). This EUA will remain in effect (meaning this test can be used) for the duration of the COVID-19 declaration under Section 564(b)(1) of the Act, 21 U.S.C. section 360bbb-3(b)(1), unless the authorization is terminated or revoked.  Performed at [facility], [address] [place], [place] [address]   Blood Culture (routine x 2)     Status: None   Collection Time: 09/17/23 12:51 PM   Specimen: BLOOD  Result Value Ref Range Status   Specimen Description BLOOD RIGHT ANTECUBITAL  Final   Special Requests   Final    BOTTLES DRAWN AEROBIC AND ANAEROBIC Blood Culture adequate volume   Culture   Final    NO GROWTH 5 DAYS  Performed at [facility], [address], [place], [place] [address]    Report Status 09/22/2023 FINAL  Final  Blood Culture (routine x 2)     Status: None   Collection Time: 09/17/23 12:56 PM   Specimen: BLOOD RIGHT HAND  Result Value Ref Range Status   Specimen Description BLOOD RIGHT HAND  Final   Special Requests   Final    BOTTLES DRAWN AEROBIC ONLY Blood Culture results may not be optimal due to an inadequate volume of blood received in culture bottles   Culture   Final    NO GROWTH 5 DAYS Performed at [facility], [address], [place], [place] [address]    Report Status 09/22/2023 FINAL  Final  Surgical pcr screen     Status: None   Collection Time: 09/18/23  9:20 PM   Specimen: Nasal Mucosa; Nasal Swab  Result Value Ref Range Status   MRSA, PCR  NEGATIVE NEGATIVE Final   Staphylococcus aureus NEGATIVE NEGATIVE Final    Comment: (NOTE) The Xpert SA Assay (FDA approved for NASAL specimens in patients 81 years of age and older), is one component of a comprehensive surveillance program. It is not intended to diagnose infection nor to guide or monitor treatment. Performed at [facility], [address] [place], [place] [address]   MRSA Next Gen by PCR, Nasal     Status: None   Collection Time: 09/19/23 10:12 AM   Specimen: Nasal Mucosa; Nasal Swab  Result Value Ref Range Status   MRSA by PCR Next Gen NOT DETECTED NOT DETECTED Final    Comment: (NOTE) The GeneXpert MRSA Assay (FDA approved for NASAL specimens only), is one component of a comprehensive MRSA colonization surveillance program. It is not intended to diagnose MRSA infection nor to guide or monitor treatment for MRSA infections. Test performance is not FDA approved in patients less than 11 years old. Performed at [facility], [address], [place], [place] [address]   Aerobic/Anaerobic Culture w Gram Stain (surgical/deep wound)     Status: None   Collection Time: 09/25/23  4:12 PM   Specimen: PATH Soft tissue  Result Value Ref Range Status   Specimen Description TISSUE LEFT THIGH  Final   Special Requests NONE  Final   Gram Stain   Final    FEW WBC PRESENT,BOTH PMN AND MONONUCLEAR RARE GRAM POSITIVE COCCI IN PAIRS    Culture   Final    FEW PSEUDOMONAS AERUGINOSA FEW ENTEROCOCCUS FAECALIS MODERATE BACTEROIDES DISTASONIS BETA LACTAMASE POSITIVE Performed at [facility], [address] [place], [place] [address]    Report Status 09/29/2023 FINAL  Final   Organism ID, Bacteria PSEUDOMONAS AERUGINOSA  Final   Organism ID, Bacteria ENTEROCOCCUS FAECALIS  Final      Susceptibility   Enterococcus faecalis - MIC*    AMPICILLIN <=2 SENSITIVE Sensitive     VANCOMYCIN 1 SENSITIVE Sensitive     GENTAMICIN SYNERGY SENSITIVE Sensitive     * FEW  ENTEROCOCCUS FAECALIS   Pseudomonas aeruginosa - MIC*    CEFTAZIDIME 16 INTERMEDIATE Intermediate     CIPROFLOXACIN 1 INTERMEDIATE Intermediate     GENTAMICIN 2 SENSITIVE Sensitive     IMIPENEM 1 SENSITIVE Sensitive     * FEW PSEUDOMONAS AERUGINOSA  Acid Fast Smear (AFB)     Status: None   Collection Time: 09/25/23  4:12 PM   Specimen: PATH Soft tissue  Result Value Ref Range Status  AFB Specimen Processing Comment  Final    Comment: Tissue Grinding and Digestion/Decontamination   Acid Fast Smear Negative  Final    Comment: (NOTE) Performed At: [facility] [address] [place], [place] [id number] Jolene Schimke MD WU:[id number]    Source (AFB) TISSUE  Final    Comment: LEFT THIGH Performed at [facility], [address], [place], [place] [address]    Plan  DC linezolid although patient was on approx a week of linezolid prior to OR. Expect MRSA would have still grown despite abtx  Continue meropenem  Next OR on 11/20 Fu OR cx to completion  Dr Renold Don covering this weekend , new ID team will follow starting Monday   Odette Fraction, MD Infectious Disease Physician [facility] [address] [place], [place] [address] Phone: [phone]  Fax: [phone]

## 2023-09-29 NOTE — Assessment & Plan Note (Signed)
 Normocytic anemia.  Hemoglobin has been stable between 7-8.  Suspect component of anemia of chronic disease.  No evidence of active bleeding. - continue to monitor

## 2023-09-29 NOTE — Assessment & Plan Note (Signed)
 Noted during OT on 11/7, TED hose ordered. Abdominal binder added 11/8. Most recent orthostatic vitals 152/68 lying, 132/55 sitting, 115/52 standing. --PT/OT eval and treat --continue to monitor

## 2023-09-29 NOTE — Plan of Care (Signed)
  Problem: Education: Goal: Ability to describe self-care measures that may prevent or decrease complications (Diabetes Survival Skills Education) will improve Outcome: Progressing   Problem: Coping: Goal: Ability to adjust to condition or change in health will improve Outcome: Progressing   Problem: Nutritional: Goal: Maintenance of adequate nutrition will improve Outcome: Progressing   

## 2023-09-29 NOTE — Assessment & Plan Note (Addendum)
Pt requested DNR status on admission, however had questionable capacity to make medical decisions and was made full code by default. Evaluated by psychiatry and deemed to have capacity to make a decision about surgery, specifically can make task specific medical decisions. Discussed code status with pt 11/14, pt able to state that she could die if CPR is not done, but unable to make a clear decision about code status. --continue full code status  --continue to discuss code status with patient and assess for understanding --determine if pt has any family or friends who can help make decisions for her

## 2023-09-29 NOTE — Progress Notes (Signed)
Physical Therapy Treatment Patient Details Name: Elizabeth Schwartz MRN: [id number] DOB: Apr 06, 1946 Today's Date: 09/29/2023   History of Present Illness Pt is a 77 y/o F admitted on 09/17/23 after being advised by [facility] nurse to go to the ED after assessing pt's wound. CT showed necrotic myositis, pt also found to have acute osteomyelitis. Pt found to have symptomatic orthostatic hypotension during therapies 11/6-11/8.  S/P I&D 11/11. PMH: anemia, COPD, dementia, depression, DM, HLD, HTN, osteoporosis, schizo-affective, SOB, syncope.    PT Comments  Pt with slightly decreased activity tolerance and ambulation distance in comparison to last session due to fatigue and pain. Pt ambulating 10 ft x 2 with a walker and chair follow. Will continue to address strengthening, progressive gait and balance as tolerated. Patient will benefit from continued inpatient follow up therapy, <3 hours/day.   If plan is discharge home, recommend the following: A little help with bathing/dressing/bathroom;Assistance with cooking/housework;Supervision due to cognitive status;Direct supervision/assist for financial management;Assist for transportation;Help with stairs or ramp for entrance;Direct supervision/assist for medications management;A lot of help with walking and/or transfers   Can travel by private vehicle     Yes  Equipment Recommendations  Rolling walker (2 wheels);BSC/3in1    Recommendations for Other Services       Precautions / Restrictions Precautions Precautions: Fall Precaution Comments: watch BP Restrictions Weight Bearing Restrictions: No     Mobility  Bed Mobility Overal bed mobility: Needs Assistance Bed Mobility: Supine to Sit     Supine to sit: Mod assist     General bed mobility comments: Slow to initiate, cues for sequencing, modA to assist trunk to upright positioning    Transfers Overall transfer level: Needs assistance Equipment used: Rolling walker (2 wheels) Transfers:  Sit to/from Stand, Bed to chair/wheelchair/BSC Sit to Stand: Min assist Stand pivot transfers: Min assist         General transfer comment: MinA to boost to standing position from edge of bed and BSC    Ambulation/Gait Ambulation/Gait assistance: Contact guard assist Gait Distance (Feet): 20 Feet (10", 10") Assistive device: Rolling walker (2 wheels) Gait Pattern/deviations: Step-to pattern, Trunk flexed, Decreased dorsiflexion - left, Decreased weight shift to left, Decreased step length - left Gait velocity: decreased Gait velocity interpretation: <1.31 ft/sec, indicative of household ambulator   General Gait Details: Antalgic gait with increased external rotation of LLE and decreased weightbearing and heel strike, CGA for balance/stability, chair follow utilized   Optometrist     Tilt Bed    Modified Rankin (Stroke Patients Only)       Balance Overall balance assessment: Needs assistance Sitting-balance support: Feet supported Sitting balance-Leahy Scale: Fair     Standing balance support: Bilateral upper extremity supported, During functional activity, Single extremity supported Standing balance-Leahy Scale: Poor                              Cognition Arousal: Alert Behavior During Therapy: Flat affect Overall Cognitive Status: History of cognitive impairments - at baseline                                 General Comments: Follows 1 step commands, difficulty with multi step commands and problem solving        Exercises      General Comments  Pertinent Vitals/Pain Pain Assessment Pain Assessment: Faces Faces Pain Scale: Hurts even more Pain Location: L thigh Pain Descriptors / Indicators: Grimacing, Discomfort, Operative site guarding Pain Intervention(s): Limited activity within patient's tolerance, Monitored during session    Home Living                          Prior  Function            PT Goals (current goals can now be found in the care plan section) Acute Rehab PT Goals Potential to Achieve Goals: Fair Progress towards PT goals: Progressing toward goals    Frequency    Min 1X/week      PT Plan      Co-evaluation              AM-PAC PT "6 Clicks" Mobility   Outcome Measure  Help needed turning from your back to your side while in a flat bed without using bedrails?: A Little Help needed moving from lying on your back to sitting on the side of a flat bed without using bedrails?: A Lot Help needed moving to and from a bed to a chair (including a wheelchair)?: A Little Help needed standing up from a chair using your arms (e.g., wheelchair or bedside chair)?: A Little Help needed to walk in hospital room?: A Little Help needed climbing 3-5 steps with a railing? : Total 6 Click Score: 15    End of Session Equipment Utilized During Treatment: Gait belt Activity Tolerance: Patient tolerated treatment well Patient left: in chair;with call bell/phone within reach;with chair alarm set Nurse Communication: Mobility status PT Visit Diagnosis: Muscle weakness (generalized) (M62.81);Pain;Other abnormalities of gait and mobility (R26.89);Difficulty in walking, not elsewhere classified (R26.2) Pain - Right/Left: Left Pain - part of body: Leg;Shoulder;Ankle and joints of foot     Time: 1000-1026 PT Time Calculation (min) (ACUTE ONLY): 26 min  Charges:    $Therapeutic Activity: 23-37 mins PT General Charges $$ ACUTE PT VISIT: 1 Visit                     Lillia Pauls, PT, DPT Acute Rehabilitation Services Office [phone]    Norval Morton 09/29/2023, 10:48 AM

## 2023-09-29 NOTE — Assessment & Plan Note (Addendum)
L foot and lower leg with ongoing edema present since admission noted to have warmth, erythema, and tenderness on exam 11/14. Low concern for infection as pt already on broad spectrum ABX. Also possible that pt has DVT, though has been on daily lovenox. Most likely that this is sequalae of L thigh injury and surgery. -continue to monitor -consider DVT doppler study LLE if symptoms worsen

## 2023-09-29 NOTE — Progress Notes (Signed)
Daily Progress Note Intern Pager: [phone]  Patient name: Elizabeth Schwartz Medical record number: [id number] Date of birth: 1946/11/06 Age: 77 y.o. Gender: female  Primary Care Provider: Storm Frisk, MD Consultants: plastic surgery Code Status: full code  Pt Overview and Major Events to Date:  11/3: admitted to FMTS 11/11: I&D L thigh  Assessment and Plan: Jamariah Hartley is a 77 year old female admitted for L thigh wound after crush injury with imaging concerning for necrotizing myositis and early osteomyelitis. Surgical treatment was delayed due to concern that pt lacked capacity to consent, pt was later deemed to have capacity per psychiatry assessment. S/p I&D, pending more wound cultures data for ABX planning and repeat surgery on 11/20.   Pertinent PMH/PSH includes HLD, schizophrenia, COPD, osteoporosis.  Assessment & Plan Necrotizing myositis Following crush injury in September 2024. S/p I&D. Surgical wound cx gram stain with few pseudomonas, enterococcus faecalis, bacteroides distasonis. Pain is well controlled. - Plastic surgery following, appreciate recommendations  -dressing changed 11/14  -plan to return to the OR 11/20 --ID following --on Linezolid and Meropenem currently -- appreciate updated ID recommendations based on current culture data - Scheduled tylenol 650mg  q6h for pain --AM CBC, BMP - TOC consult, anticipated SNF at discharge Osteomyelitis of left femur (HCC) Early acute osteomyelitis found on CT. Treatment plan as above. Skin ulcer of left great toe (HCC) Noted on admission, appears stable on exam today. BL feet also with dry skin. -vaseline to feet BID -continue to monitor Left leg swelling L foot and lower leg with ongoing edema present since admission noted to have warmth, erythema, and tenderness on exam 11/14. Low concern for infection as pt already on broad spectrum ABX. Also possible that pt has DVT, though has been on daily lovenox. Most  likely that this is sequalae of L thigh injury and surgery. -continue to monitor -consider DVT doppler study LLE if symptoms worsen Diabetes mellitus without complication (HCC) A1c 6.1 in Sept 24. Blood glucose has been well controlled and stable during hospital stay.  Will continue to monitor. -hold home metformin Dementia (HCC) Alzheimer's dementia. - Memantine 5 mg twice daily, Aricept 10 mg at bedtime - Delirium precautions - Fall precautions Anemia Normocytic anemia.  Hemoglobin has been stable between 7-8.  Suspect component of anemia of chronic disease.  No evidence of active bleeding. - continue to monitor Orthostatic hypotension Noted during OT on 11/7, TED hose ordered. Abdominal binder added 11/8. Most recent orthostatic vitals 152/68 lying, 132/55 sitting, 115/52 standing. --PT/OT eval and treat --continue to monitor Goals of care, counseling/discussion Pt requested DNR status on admission, however had questionable capacity to make medical decisions and was made full code by default. Evaluated by psychiatry and deemed to have capacity to make a decision about surgery, specifically can make task specific medical decisions. Discussed code status with pt 11/14, pt able to state that she could die if CPR is not done, but unable to make a clear decision about code status. --continue full code status  --continue to discuss code status with patient and assess for understanding --determine if pt has any family or friends who can help make decisions for her   Chronic and Stable Issues: HLD-Have not started home Zetia 10 mg daily, fenofibrate 160 mg daily (unclear if taking) Schizophrenia-continue home clozapine 200 mg at bedtime  COPD-Continue Symbicort 2 puffs twice daily, albuterol as needed Osteoporosis-Have not ordered alendronate 70 mg once a week on Wednesdays per chart review  FEN/GI: regular PPx: lovenox Dispo: pending clinical improvement  Subjective:  Pt states she  feels well today. No questions or concerns.  Objective: Temp:  [97.8 F (36.6 C)-98.5 F (36.9 C)] 98.1 F (36.7 C) (11/15 0810) Pulse Rate:  [80-89] 86 (11/15 0810) Resp:  [16-18] 16 (11/15 0810) BP: (128-151)/(50-71) 151/71 (11/15 0810) SpO2:  [96 %-98 %] 98 % (11/15 0810) Physical Exam: General: laying in bed, awake and conversant, in NAD Cardiovascular: RRR, normal S1/S2 Respiratory: CTAB, normal WOB on RA Abdomen: normoactive bowel sounds, soft, nontender, nondistended Extremities: BL compression stockings in place  Laboratory: Most recent CBC Lab Results  Component Value Date   WBC 5.9 09/29/2023   HGB 7.7 (L) 09/29/2023   HCT 25.7 (L) 09/29/2023   MCV 91.1 09/29/2023   PLT 362 09/29/2023   Most recent BMP    Latest Ref Rng & Units 09/29/2023    6:35 AM  BMP  Glucose 70 - 99 mg/dL 829   BUN 8 - 23 mg/dL 19   Creatinine 5.62 - 1.00 mg/dL 1.30   Sodium 865 - 784 mmol/L 141   Potassium 3.5 - 5.1 mmol/L 3.5   Chloride 98 - 111 mmol/L 112   CO2 22 - 32 mmol/L 21   Calcium 8.9 - 10.3 mg/dL 8.8       Rohil Lesch, Tacey Ruiz, MD 09/29/2023, 8:40 AM  PGY-1, [facility] FPTS Intern pager: [phone], text pages welcome Secure chat group [facility] Teaching Service

## 2023-09-30 DIAGNOSIS — E119 Type 2 diabetes mellitus without complications: Secondary | ICD-10-CM

## 2023-09-30 DIAGNOSIS — M86152 Other acute osteomyelitis, left femur: Secondary | ICD-10-CM | POA: Diagnosis not present

## 2023-09-30 DIAGNOSIS — L89224 Pressure ulcer of left hip, stage 4: Secondary | ICD-10-CM | POA: Diagnosis not present

## 2023-09-30 DIAGNOSIS — M6080 Other myositis, unspecified site: Secondary | ICD-10-CM | POA: Diagnosis not present

## 2023-09-30 LAB — GLUCOSE, CAPILLARY
Glucose-Capillary: 94 mg/dL (ref 70–99)
Glucose-Capillary: 157 mg/dL — ABNORMAL HIGH (ref 70–99)

## 2023-09-30 NOTE — Assessment & Plan Note (Signed)
A1c 8/29 of 6.1.  Home medications include metformin 500 mg twice daily and she received SSI at SNF. Glucose 117 on admission. -Sensitive SSI with CBGs at meals and before bedtime

## 2023-09-30 NOTE — Assessment & Plan Note (Addendum)
Pt requested DNR status on admission, however had questionable capacity to make medical decisions and was made full code by default. Evaluated by psychiatry and deemed to have capacity to make a decision about surgery, specifically can make task specific medical decisions. Discussed code status with pt 11/14, pt able to state that she could die if CPR is not done, but unable to make a clear decision about code status. --continue full code status  --continue to discuss code status with patient and assess for understanding

## 2023-09-30 NOTE — Assessment & Plan Note (Addendum)
Noted on admission, appears stable on exam today, see media tab. BL feet also with dry skin. -vaseline to feet BID -continue to monitor

## 2023-09-30 NOTE — Assessment & Plan Note (Addendum)
 Noted during OT on 11/7, TED hose ordered. Abdominal binder added 11/8. Most recent orthostatic vitals 152/68 lying, 132/55 sitting, 115/52 standing. --PT/OT eval and treat --continue to monitor

## 2023-09-30 NOTE — Assessment & Plan Note (Addendum)
L foot and lower leg with ongoing edema present since admission noted to have warmth, erythema, and tenderness on exam 11/14. Low concern for infection as pt already on broad spectrum ABX. Also possible that pt has DVT, though less likely given she has been on daily lovenox. Most likely that this is sequalae of L thigh injury and surgery. -continue to monitor -consider DVT doppler study LLE if symptoms worsen

## 2023-09-30 NOTE — Assessment & Plan Note (Addendum)
 Early acute osteomyelitis found on CT. Treatment plan as above.

## 2023-09-30 NOTE — Progress Notes (Signed)
Mobility Specialist: Progress Note   09/30/23 1559  Mobility  Activity Ambulated with assistance in room  Level of Assistance Contact guard assist, steadying assist  Assistive Device Front wheel walker  Distance Ambulated (ft) 20 ft  Activity Response Tolerated well  Mobility Referral Yes  $Mobility charge 1 Mobility  Mobility Specialist Start Time (ACUTE ONLY) 1511  Mobility Specialist Stop Time (ACUTE ONLY) 1543  Mobility Specialist Time Calculation (min) (ACUTE ONLY) 32 min    Pre-Mobility: BP 140/56  Pt was agreeable to mobility session - received in bed. MinA for bed mobility to scoot up and assist with trunk elevation, minA for STS, CG for ambulation. A little confused today but in good spirits. Required mod verbal and tactile cues for hand placement and RW proximity; cues had to be repeated multiple times each time before followed. Took 1x seated break d/t fatigue. Had c/o LLE pain. Left in bed with all needs met, call bell in reach.    Maurene Capes Mobility Specialist Please contact via SecureChat or Rehab office at [phone]

## 2023-09-30 NOTE — Assessment & Plan Note (Addendum)
Normocytic anemia.  Hemoglobin has been stable between 7-8.  Suspect component of anemia of chronic disease.  No evidence of active bleeding. - continue to monitor, lab holiday last night

## 2023-09-30 NOTE — Assessment & Plan Note (Addendum)
Following crush injury in September 2024. S/p I&D. Surgical wound cx gram stain with few pseudomonas, enterococcus faecalis, bacteroides distasonis. Pain is well controlled. - Plastic surgery following, appreciate recommendations  -dressing changed 11/14  -plan to return to the OR 11/20 --ID following --on Linezolid and Meropenem currently -- appreciate updated ID recommendations based on current culture data - Scheduled tylenol 650mg  q6h for pain --AM CBC, BMP - TOC consult, anticipated SNF at discharge

## 2023-09-30 NOTE — Assessment & Plan Note (Addendum)
A1c 6.1 in Sept 24. Blood glucose has been well controlled and stable during hospital stay.  Will continue to monitor. -hold home metformin

## 2023-09-30 NOTE — Progress Notes (Signed)
Daily Progress Note Intern Pager: [phone]  Patient name: Elizabeth Schwartz Medical record number: [id number] Date of birth: [birth year] Age: 77 y.o. Gender: female  Primary Care Provider: Storm Frisk, MD Consultants: Plastic surgery, infectious disease Code Status: Full code  Pt Overview and Major Events to Date:  11/3: admitted to [facility]S 11/11: I&D L thigh  Assessment and Plan:  77 year old female with past medical history hyperlipidemia, schizophrenia, dementia, COPD, osteoporosis, diabetes who was admitted for left thigh wound after a crush injury and found to have imaging concerning for necrotizing myositis and early osteomyelitis.  Treatment was delayed due to concern the patient lacked capacity to consent to surgery, the patient was later deemed to have capacity with formal psychiatry assessment.  She does not have any known family involved. S/p I&D, pending more wound cultures data for ABX planning and repeat surgery on 11/20.  Assessment & Plan Necrotizing myositis Following crush injury in September 2024. S/p I&D. Surgical wound cx gram stain with few pseudomonas, enterococcus faecalis, bacteroides distasonis. Pain is well controlled. - Plastic surgery following, appreciate recommendations  -dressing changed 11/14  -plan to return to the OR 11/20 --ID following --on Linezolid and Meropenem currently -- appreciate updated ID recommendations based on current culture data - Scheduled tylenol 650mg  q6h for pain - AM CBC, BMP - TOC consult, anticipated SNF at discharge Osteomyelitis of left femur (HCC) Early acute osteomyelitis found on CT. Treatment plan as above. Skin ulcer of left great toe (HCC) Noted on admission, appears stable on exam today, see media tab. BL feet also with dry skin. -vaseline to feet BID -continue to monitor Left leg swelling L foot and lower leg with ongoing edema present since admission noted to have warmth, erythema, and tenderness on exam  11/14. Low concern for infection as pt already on broad spectrum ABX. Also possible that pt has DVT, though less likely given she has been on daily lovenox. Most likely that this is sequalae of L thigh injury and surgery. -continue to monitor -consider DVT doppler study LLE if symptoms worsen Diabetes mellitus without complication (HCC) A1c 6.1 in Sept 24. Blood glucose has been well controlled and stable during hospital stay.  Will continue to monitor. -hold home metformin Dementia (HCC) Alzheimer's dementia. - Memantine 5 mg twice daily, Aricept 10 mg at bedtime - Delirium precautions - Fall precautions Anemia Normocytic anemia.  Hemoglobin has been stable between 7-8.  Suspect component of anemia of chronic disease.  No evidence of active bleeding. - continue to monitor, lab holiday last night Orthostatic hypotension Noted during OT on 11/7, TED hose ordered. Abdominal binder added 11/8. Most recent orthostatic vitals 152/68 lying, 132/55 sitting, 115/52 standing.  --PT/OT eval and treat --continue to monitor Goals of care, counseling/discussion Pt requested DNR status on admission, however had questionable capacity to make medical decisions and was made full code by default. Evaluated by psychiatry and deemed to have capacity to make a decision about surgery, specifically can make task specific medical decisions. Discussed code status with pt 11/14, pt able to state that she could die if CPR is not done, but unable to make a clear decision about code status. --continue full code status  --continue to discuss code status with patient and assess for understanding   Chronic and Stable Problems:  HLD-Have not started home Zetia 10 mg daily, fenofibrate 160 mg daily (unclear if taking) Schizophrenia-continue home clozapine 200 mg at bedtime  COPD-dulera 2puff BID, albuterol as needed Osteoporosis-Have not  ordered alendronate 70 mg once a week on Wednesdays per chart review   FEN/GI:  regular PPx: lovenox Dispo:pending repeat surgery 11/20  Subjective:  No acute events overnight.  Nurse states that patient complained of pain when she was changing her bandage.  Patient does report some pain at her surgical site.  Otherwise asymptomatic  Objective: Temp:  [98.1 F (36.7 C)-98.7 F (37.1 C)] 98.2 F (36.8 C) (11/16 0445) Pulse Rate:  [84-89] 85 (11/16 0445) Resp:  [16] 16 (11/16 0445) BP: (134-151)/(49-71) 135/55 (11/16 0445) SpO2:  [94 %-98 %] 98 % (11/16 0445) Physical Exam: General: Resting comfortably, no acute distress Cardiovascular: Well-perfused Respiratory: No increased work of breathing on room air Extremities: Left thigh wrapped in Ace bandage, clean dry and intact.  1+ swelling bilateral lower extremities.  No calf tenderness, warmth left lower extremity.  Chronic appearing ulcer to toe on left lower extremity, see picture in media tab.  Laboratory: Most recent CBC Lab Results  Component Value Date   WBC 5.9 09/29/2023   HGB 7.7 (L) 09/29/2023   HCT 25.7 (L) 09/29/2023   MCV 91.1 09/29/2023   PLT 362 09/29/2023   Most recent BMP    Latest Ref Rng & Units 09/29/2023    6:35 AM  BMP  Glucose 70 - 99 mg/dL 086   BUN 8 - 23 mg/dL 19   Creatinine 5.78 - 1.00 mg/dL 4.69   Sodium 629 - 528 mmol/L 141   Potassium 3.5 - 5.1 mmol/L 3.5   Chloride 98 - 111 mmol/L 112   CO2 22 - 32 mmol/L 21   Calcium 8.9 - 10.3 mg/dL 8.8    Elizabeth Ciaravino, DO 09/30/2023, 7:07 AM  PGY-1, [facility] FPTS Intern pager: [phone], text pages welcome Secure chat group [facility] Teaching Service

## 2023-09-30 NOTE — Assessment & Plan Note (Addendum)
 Alzheimer's dementia. - Memantine 5 mg twice daily, Aricept 10 mg at bedtime - Delirium precautions - Fall precautions

## 2023-10-01 DIAGNOSIS — D649 Anemia, unspecified: Secondary | ICD-10-CM

## 2023-10-01 DIAGNOSIS — L89224 Pressure ulcer of left hip, stage 4: Secondary | ICD-10-CM | POA: Diagnosis not present

## 2023-10-01 DIAGNOSIS — E119 Type 2 diabetes mellitus without complications: Secondary | ICD-10-CM | POA: Diagnosis not present

## 2023-10-01 DIAGNOSIS — M86152 Other acute osteomyelitis, left femur: Secondary | ICD-10-CM | POA: Diagnosis not present

## 2023-10-01 DIAGNOSIS — M6080 Other myositis, unspecified site: Secondary | ICD-10-CM | POA: Diagnosis not present

## 2023-10-01 LAB — BASIC METABOLIC PANEL
Anion gap: 8 (ref 5–15)
BUN: 21 mg/dL (ref 8–23)
CO2: 22 mmol/L (ref 22–32)
Calcium: 9.2 mg/dL (ref 8.9–10.3)
Chloride: 112 mmol/L — ABNORMAL HIGH (ref 98–111)
Creatinine, Ser: 0.73 mg/dL (ref 0.44–1.00)
GFR, Estimated: >60 mL/min (ref >60)
Glucose, Bld: 117 mg/dL — ABNORMAL HIGH (ref 70–99)
Potassium: 3.5 mmol/L (ref 3.5–5.1)
Sodium: 142 mmol/L (ref 135–145)

## 2023-10-01 LAB — CBC
HCT: 25.3 % — ABNORMAL LOW (ref 36.0–46.0)
Hemoglobin: 7.7 g/dL — ABNORMAL LOW (ref 12.0–15.0)
MCH: 27.5 pg (ref 26.0–34.0)
MCHC: 30.4 g/dL (ref 30.0–36.0)
MCV: 90.4 fL (ref 80.0–100.0)
Platelets: 317 10*3/uL (ref 150–400)
RBC: 2.8 MIL/uL — ABNORMAL LOW (ref 3.87–5.11)
RDW: 18.4 % — ABNORMAL HIGH (ref 11.5–15.5)
WBC: 5.5 10*3/uL (ref 4.0–10.5)
nRBC: 0 % (ref 0.0–0.2)

## 2023-10-01 LAB — GLUCOSE, CAPILLARY
Glucose-Capillary: 103 mg/dL — ABNORMAL HIGH (ref 70–99)
Glucose-Capillary: 103 mg/dL — ABNORMAL HIGH (ref 70–99)

## 2023-10-01 MED ORDER — SODIUM CHLORIDE 0.9 % IV SOLN
1.0000 g | Freq: Three times a day (TID) | INTRAVENOUS | Status: DC
  Administered 2023-10-01 – 2023-10-20 (×56): 1 g via INTRAVENOUS
  Filled 2023-10-01 (×56): qty 20

## 2023-10-01 NOTE — Assessment & Plan Note (Addendum)
Noted during OT on 11/7. Using TED hose and abdominal binder -PT/OT following

## 2023-10-01 NOTE — Progress Notes (Signed)
PHARMACY NOTE:  ANTIMICROBIAL RENAL DOSAGE ADJUSTMENT  Current antimicrobial regimen includes a mismatch between antimicrobial dosage and estimated renal function.  As per policy approved by the Pharmacy & Therapeutics and Medical Executive Committees, the antimicrobial dosage will be adjusted accordingly.  Current antimicrobial dosage:  Meropenem 1 g IV q12h  Indication: Necrotizing myositis/osteomyelitis  Renal Function:  Estimated Creatinine Clearance: 59.6 mL/min (by C-G formula based on SCr of 0.73 mg/dL).    Antimicrobial dosage has been changed to:  Meropenem 1 g IV q8h  Additional comments:   Thank you for allowing pharmacy to be a part of this patient's care.  Dalene Carrow, [facility] 10/01/2023 2:59 PM

## 2023-10-01 NOTE — Assessment & Plan Note (Addendum)
Hgb has been stable between 7-8.

## 2023-10-01 NOTE — Progress Notes (Signed)
Daily Progress Note Intern Pager: [phone]  Patient name: Elizabeth Schwartz Medical record number: [id number] Date of birth: May 15, 1946 Age: 77 y.o. Gender: female  Primary Care Provider: Storm Frisk, MD Consultants: Plastic surgery, ID Code Status: Full code  Pt Overview and Major Events to Date:  11/3: Admitted to FMTS 11/11: I&D L thigh  Assessment and Plan: 77 year old female with PMHx HLD, schizophrenia, dementia, COPD, osteoporosis and T2DM who was admitted for left thigh wound after a crush injury and found to have imaging concerning for necrotizing myositis and early osteomyelitis.  Treatment was delayed due to concern the patient lacked capacity to consent to surgery, the patient was later deemed to have capacity with formal psychiatry assessment.  She does not have any known family involved. S/p I&D, pending more wound cultures data for ABX planning and repeat surgery on 11/20.  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 Pain well controlled -Plastic surgery following -Daily dressing changes -Return to the OR 11/20 -ID following -Continue Meropenem -Pain management: Scheduled tylenol 650mg  q6h Osteomyelitis of left femur (HCC) Early acute osteomyelitis found on CT. Treatment plan as above. Skin ulcer of left great toe (HCC) Noted on admission, appears stable on exam today. -Vaseline to feet BID Left leg swelling L foot and lower leg with ongoing edema present since admission noted to have warmth, erythema, and tenderness on exam 11/14. Low concern for infection as pt already on broad spectrum ABX. Also possible that pt has DVT, though less likely given she has been on daily lovenox. Most likely that this is sequalae of L thigh injury and surgery. -Consider DVT doppler study LLE if symptoms worsen Diabetes mellitus without complication (HCC) A1c 6.1 in Sept 24. BGL  stable. -Hold home metformin Normocytic anemia Hgb has been stable between 7-8. Orthostatic hypotension Noted during OT on 11/7. Using TED hose and abdominal binder -PT/OT following Goals of care, counseling/discussion Evaluated by psych and deemed to have capacity. Reiterated full code status this AM -Continue full code status   Chronic and Stable Problems:  HLD: Have not started home Zetia 10 mg daily, fenofibrate 160 mg daily (unclear if taking) Schizophrenia: Continue home clozapine 200 mg at bedtime  COPD: Dulera 2puff BID, albuterol as needed Osteoporosis: Have not ordered alendronate 70 mg once a week on Wednesdays per chart review Dementia: Memantine 5 mg twice daily, Aricept 10 mg at bedtime    FEN/GI: regular PPx: lovenox Dispo:pending repeat surgery 11/20  Subjective:  Patient assessed at bedside, doing well this AM. Pain well controlled. No concerns. Wants to be full code.  Objective: Temp:  [97.5 F (36.4 C)-98.1 F (36.7 C)] 97.9 F (36.6 C) (11/17 0454) Pulse Rate:  [89-102] 89 (11/17 0454) Resp:  [17-18] 18 (11/17 0454) BP: (128-142)/(49-97) 136/97 (11/17 0454) SpO2:  [96 %-100 %] 100 % (11/17 0454) Physical Exam: General: Elderly female sitting up in bed, NAD Cardiovascular: RRR without murmur Respiratory: CTAB. Normal WOB on RA Abdomen: Soft, non-distended Extremities: Left thigh in Ace bandage, clean and dry. 2+ pitting edema of R LE. Chronic ulcer of L 1st digit.  Laboratory: Most recent CBC Lab Results  Component Value Date   WBC 5.9 09/29/2023   HGB 7.7 (L) 09/29/2023   HCT 25.7 (L) 09/29/2023   MCV 91.1 09/29/2023   PLT 362 09/29/2023   Most recent BMP    Latest Ref Rng & Units 09/29/2023  6:35 AM  BMP  Glucose 70 - 99 mg/dL 604   BUN 8 - 23 mg/dL 19   Creatinine 5.40 - 1.00 mg/dL 9.81   Sodium 191 - 478 mmol/L 141   Potassium 3.5 - 5.1 mmol/L 3.5   Chloride 98 - 111 mmol/L 112   CO2 22 - 32 mmol/L 21   Calcium 8.9 - 10.3 mg/dL  8.8     Other pertinent labs: None   Elberta Fortis, MD 10/01/2023, 6:04 AM  PGY-2, [facility] FPTS Intern pager: [phone], text pages welcome Secure chat group [facility] Teaching Service

## 2023-10-01 NOTE — Assessment & Plan Note (Addendum)
L foot and lower leg with ongoing edema present since admission noted to have warmth, erythema, and tenderness on exam 11/14. Low concern for infection as pt already on broad spectrum ABX. Also possible that pt has DVT, though less likely given she has been on daily lovenox. Most likely that this is sequalae of L thigh injury and surgery. -Consider DVT doppler study LLE if symptoms worsen

## 2023-10-01 NOTE — Assessment & Plan Note (Deleted)
 Alzheimer's dementia. - Memantine 5 mg twice daily, Aricept 10 mg at bedtime - Delirium precautions - Fall precautions

## 2023-10-01 NOTE — Assessment & Plan Note (Addendum)
A1c 6.1 in Sept 24. BGL stable. -Hold home metformin

## 2023-10-01 NOTE — Assessment & Plan Note (Addendum)
Evaluated by psych and deemed to have capacity. Reiterated full code status this AM -Continue full code status

## 2023-10-01 NOTE — Assessment & Plan Note (Addendum)
2/2 crush injury now s/p I&D on 11/11 with plans for further debridement on 11/20 per Plastics. Pain well controlled -Plastic surgery following -Daily dressing changes -Return to the OR 11/20 -ID following -Continue Meropenem -Pain management: Scheduled tylenol 650mg  q6h

## 2023-10-01 NOTE — Assessment & Plan Note (Addendum)
 Early acute osteomyelitis found on CT. Treatment plan as above.

## 2023-10-01 NOTE — Plan of Care (Signed)

## 2023-10-01 NOTE — Assessment & Plan Note (Addendum)
Noted on admission, appears stable on exam today. -Vaseline to feet BID

## 2023-10-01 NOTE — Assessment & Plan Note (Deleted)
A1c 8/29 of 6.1.  Home medications include metformin 500 mg twice daily and she received SSI at SNF. Glucose 117 on admission. -Sensitive SSI with CBGs at meals and before bedtime

## 2023-10-02 DIAGNOSIS — L89224 Pressure ulcer of left hip, stage 4: Secondary | ICD-10-CM | POA: Diagnosis not present

## 2023-10-02 LAB — BASIC METABOLIC PANEL
Anion gap: 7 (ref 5–15)
BUN: 15 mg/dL (ref 8–23)
CO2: 24 mmol/L (ref 22–32)
Calcium: 9.4 mg/dL (ref 8.9–10.3)
Chloride: 112 mmol/L — ABNORMAL HIGH (ref 98–111)
Creatinine, Ser: 0.8 mg/dL (ref 0.44–1.00)
GFR, Estimated: >60 mL/min (ref >60)
Glucose, Bld: 101 mg/dL — ABNORMAL HIGH (ref 70–99)
Potassium: 3.7 mmol/L (ref 3.5–5.1)
Sodium: 143 mmol/L (ref 135–145)

## 2023-10-02 LAB — CBC
HCT: 25.8 % — ABNORMAL LOW (ref 36.0–46.0)
Hemoglobin: 7.9 g/dL — ABNORMAL LOW (ref 12.0–15.0)
MCH: 28 pg (ref 26.0–34.0)
MCHC: 30.6 g/dL (ref 30.0–36.0)
MCV: 91.5 fL (ref 80.0–100.0)
Platelets: 326 10*3/uL (ref 150–400)
RBC: 2.82 MIL/uL — ABNORMAL LOW (ref 3.87–5.11)
RDW: 19.2 % — ABNORMAL HIGH (ref 11.5–15.5)
WBC: 5 10*3/uL (ref 4.0–10.5)
nRBC: 0 % (ref 0.0–0.2)

## 2023-10-02 LAB — GLUCOSE, CAPILLARY
Glucose-Capillary: 180 mg/dL — ABNORMAL HIGH (ref 70–99)
Glucose-Capillary: 120 mg/dL — ABNORMAL HIGH (ref 70–99)

## 2023-10-02 NOTE — Progress Notes (Signed)
Physical Therapy Treatment Patient Details Name: Elizabeth Schwartz MRN: [id number] DOB: 1946/01/05 Today's Date: 10/02/2023   History of Present Illness Pt is a 77 y/o F admitted on 09/17/23 after being advised by [facility] nurse to go to the ED after assessing pt's wound. CT showed necrotic myositis, pt also found to have acute osteomyelitis. Pt found to have symptomatic orthostatic hypotension during therapies 11/6-11/8.  S/P I&D 11/11. PMH: anemia, COPD, dementia, depression, DM, HLD, HTN, osteoporosis, schizo-affective, SOB, syncope.    PT Comments  Pt received in bed, session today limited by pain L thigh and L shoulder which pt rates as 10/10, RN notified. Pt needed mod A to come to EOB more due to difficulties with problem solving than due to pain. Mod A needed for sit>stand and pt had difficulty bearing wt LLE and was partially hopping on R. Moved to chair with RW and mod A and deferred further ambulation due to pain. Pt noted mild light headedness with mobility. PT will continue to follow.     If plan is discharge home, recommend the following: A little help with bathing/dressing/bathroom;Assistance with cooking/housework;Supervision due to cognitive status;Direct supervision/assist for financial management;Assist for transportation;Help with stairs or ramp for entrance;Direct supervision/assist for medications management;A lot of help with walking and/or transfers   Can travel by private vehicle     Yes  Equipment Recommendations  Rolling walker (2 wheels);BSC/3in1    Recommendations for Other Services       Precautions / Restrictions Precautions Precautions: Fall Precaution Comments: watch BP, has B thigh high TED hose Restrictions Weight Bearing Restrictions: No     Mobility  Bed Mobility Overal bed mobility: Needs Assistance Bed Mobility: Rolling, Sidelying to Sit Rolling: Min assist Sidelying to sit: Used rails, Mod assist       General bed mobility comments: Slow to  initiate, cues for sequencing, modA to assist trunk to upright positioning    Transfers Overall transfer level: Needs assistance Equipment used: Rolling walker (2 wheels) Transfers: Sit to/from Stand, Bed to chair/wheelchair/BSC Sit to Stand: Mod assist   Step pivot transfers: Mod assist       General transfer comment: mod A for power up, had difficulty with mgmt of L shoulder in holding RW and with WB'ing through LLE. Pt needed mod A to step to chair, partially hopping on RLE due to LLE pain.    Ambulation/Gait               General Gait Details: plan was to ambulate into bathroom but once pt was standing she reported pain was too great to ambulate further than recliner   Stairs             Wheelchair Mobility     Tilt Bed    Modified Rankin (Stroke Patients Only)       Balance Overall balance assessment: Needs assistance Sitting-balance support: Feet supported Sitting balance-Leahy Scale: Fair Sitting balance - Comments: supervision static sitting   Standing balance support: Bilateral upper extremity supported, During functional activity, Single extremity supported Standing balance-Leahy Scale: Poor Standing balance comment: relies on UE and external support                            Cognition Arousal: Alert Behavior During Therapy: Flat affect Overall Cognitive Status: History of cognitive impairments - at baseline  General Comments: Follows 1 step commands, difficulty with multi step commands and problem solving. Has difficulty sequencing functional activities even with vc's        Exercises Other Exercises Other Exercises: AP's in sitting EOB and sitting in recliner x20    General Comments General comments (skin integrity, edema, etc.): pt reports feeling light headed, VSS      Pertinent Vitals/Pain Pain Assessment Pain Assessment: 0-10 Pain Score: 10-Worst pain ever Pain  Location: L thigh, L shoulder Pain Descriptors / Indicators: Grimacing, Discomfort, Guarding Pain Intervention(s): Limited activity within patient's tolerance, Monitored during session, Patient requesting pain meds-RN notified    Home Living                          Prior Function            PT Goals (current goals can now be found in the care plan section) Acute Rehab PT Goals Patient Stated Goal: none stated PT Goal Formulation: With patient Time For Goal Achievement: 10/02/23 Potential to Achieve Goals: Fair Progress towards PT goals: Not progressing toward goals - comment (pain)    Frequency    Min 1X/week      PT Plan      Co-evaluation              AM-PAC PT "6 Clicks" Mobility   Outcome Measure  Help needed turning from your back to your side while in a flat bed without using bedrails?: A Little Help needed moving from lying on your back to sitting on the side of a flat bed without using bedrails?: A Lot Help needed moving to and from a bed to a chair (including a wheelchair)?: A Little Help needed standing up from a chair using your arms (e.g., wheelchair or bedside chair)?: A Little Help needed to walk in hospital room?: A Little Help needed climbing 3-5 steps with a railing? : Total 6 Click Score: 15    End of Session Equipment Utilized During Treatment: Gait belt Activity Tolerance: Patient limited by pain Patient left: in chair;with call bell/phone within reach;with chair alarm set Nurse Communication: Mobility status PT Visit Diagnosis: Muscle weakness (generalized) (M62.81);Pain;Other abnormalities of gait and mobility (R26.89);Difficulty in walking, not elsewhere classified (R26.2) Pain - Right/Left: Left Pain - part of body: Leg;Shoulder     Time: 1610-9604 PT Time Calculation (min) (ACUTE ONLY): 18 min  Charges:    $Therapeutic Activity: 8-22 mins PT General Charges $$ ACUTE PT VISIT: 1 Visit                     Lyanne Co, PT  Acute Rehab Services Secure chat preferred Office [phone]    Lawana Chambers Maila Dukes 10/02/2023, 1:45 PM

## 2023-10-02 NOTE — Assessment & Plan Note (Addendum)
L foot and lower leg with ongoing edema present since admission noted to have warmth, erythema, and tenderness on exam 11/14. Low concern for infection as pt already on broad spectrum ABX. Also possible that pt has DVT, though less likely given she has been on daily lovenox. Most likely that this is sequalae of L thigh injury and surgery. -Consider DVT doppler study LLE if symptoms worsen

## 2023-10-02 NOTE — Assessment & Plan Note (Addendum)
2/2 crush injury now s/p I&D on 11/11 with plans for further debridement on 11/20 per Plastics. Pain well controlled -Plastic surgery following -Daily dressing changes -Return to the OR 11/20 -ID following -Continue Meropenem -Pain management: Scheduled tylenol 650mg  q6h

## 2023-10-02 NOTE — Plan of Care (Signed)

## 2023-10-02 NOTE — Progress Notes (Signed)
Nutrition Follow-up  DOCUMENTATION CODES:   Not applicable  INTERVENTION:   -Continue regular diet with encouragement with added protein snacks bid.  -Continue -1 packet Juven BID, each packet provides 95 calories, 2.5 grams of protein (collagen), and 9.8 grams of carbohydrate (3 grams sugar); also contains 7 grams of L-arginine and L-glutamine, 300 mg vitamin C, 15 mg vitamin E, 1.2 mcg vitamin B-12, 9.5 mg zinc, 200 mg calcium, and 1.5 g  Calcium Beta-hydroxy-Beta-methylbutyrate to support wound healing  -Continue MVI/Minerals-1 Tab daily, vitamin C 250 mg daily for micronutrient support to aide in wound healing.   NUTRITION DIAGNOSIS:   Increased nutrient needs related to wound healing as evidenced by estimated needs-ongoing. -addressing with oral supplement and meals   GOAL:   Patient will meet greater than or equal to 90% of their needs -progressing    MONITOR:   PO intake, Weight trends, Skin, Labs  REASON FOR ASSESSMENT:   Consult Assessment of nutrition requirement/status, Wound healing  ASSESSMENT:   77 y/o female presented with left thigh wound after a crush injury last month. Imaging showed evidenced of necrotizing myositis and acute early osteomyelitis.  Recent hospitalization on 9/25-10/4/24  after a stove fell on her leg causing pressure injury of the left thigh.  She was found by neighbors and had rhabdo. Underwent SNF discharge after hospital stay and was recently discharged.  PMH: DM2, HTN, HLD, osteoporosis, COPD, anemia, schizophrenia, dementia.  Meal intakes recorded mostly 100%.  Patient states she is taking the [place], RN confirms. Patient also notes she is consuming the snacks.  Weight has not been updated since admit, request bed scale weight. Edema 2+ LLE.  Medications reviewed and include vitamin C 250 mg 2x daily, MVI/Minerals-1 Tab daily.  Labs: CBG 103, 180 past 24 hrs   Diet Order:   Diet Order             Diet regular Room service  appropriate? Yes; Fluid consistency: Thin  Diet effective now                   EDUCATION NEEDS:   Not appropriate for education at this time  Skin:  Skin Assessment: Skin Integrity Issues: Skin Integrity Issues:: Incisions Incisions: L leg Other: non-pressure injury to left anterior distal thigh-moderate drainage, right toe abrasion, blanchable redness to posterior hip  Last BM:  10/02/23  Height:   Ht Readings from Last 1 Encounters:  09/17/23 5\' 5"  (1.651 m)    Weight:   Wt Readings from Last 1 Encounters:  09/17/23 74.8 kg    Ideal Body Weight:  59.1 kg  BMI:  Body mass index is 27.46 kg/m.  Estimated Nutritional Needs:   Kcal:  1500-1800 kcal/day  Protein:  77-89 gm/day  Fluid:  1500-1800 mL/day    Elizabeth Schwartz, RDLD Clinical Dietitian See AMION for contact information

## 2023-10-02 NOTE — Progress Notes (Signed)
Daily Progress Note Intern Pager: [phone]  Patient name: Elizabeth Schwartz Medical record number: [id number] Date of birth: [birth year] Age: 77 y.o. Gender: female  Primary Care Provider: Storm Frisk, MD Consultants: Plastic surgery, ID  Code Status: Full code   Pt Overview and Major Events to Date:  11/3: Admitted to [facility] 11/11: I&D L thigh  Assessment and Plan:  77 year old female with PMHx HLD, schizophrenia, dementia, COPD, osteoporosis and T2DM who was admitted for left thigh wound after a crush injury and found to have imaging concerning for necrotizing myositis and early osteomyelitis.  Treatment was delayed due to concern the patient lacked capacity to consent to surgery, the patient was later deemed to have capacity with formal psychiatry assessment.  She does not have any known family involved. S/p I&D, pending more wound cultures data for ABX planning and repeat surgery on 11/20.  Will work on consenting pt for surgery. 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 Pain well controlled -Plastic surgery following -Daily dressing changes -Return to the OR 11/20 -ID following -Continue Meropenem -Pain management: Scheduled tylenol 650mg  q6h Osteomyelitis of left femur (HCC) Early acute osteomyelitis found on CT. Treatment plan as above. Skin ulcer of left great toe (HCC) Noted on admission, appears stable on exam today. -Vaseline to feet BID Left leg swelling L foot and lower leg with ongoing edema present since admission noted to have warmth, erythema, and tenderness on exam 11/14. Low concern for infection as pt already on broad spectrum ABX. Also possible that pt has DVT, though less likely given she has been on daily lovenox. Most likely that this is sequalae of L thigh injury and surgery. -Consider DVT doppler study LLE if symptoms worsen Normocytic anemia Hgb has been  stable between 7-8. Orthostatic hypotension Noted during OT on 11/7. No issues since. Using TED hose and abdominal binder -PT/OT following Type 2 diabetes mellitus without complication (HCC) A1c 8/29 of 6.1.  Home medications include metformin 500 mg twice daily and she received SSI at SNF. CBGs have been stable since admission - SSI discontinued Goals of care, counseling/discussion Evaluated by psych and deemed to have capacity. Reiterated full code status this AM -Continue full code status  Dementia (HCC) Alzheimer's dementia. - Memantine 5 mg twice daily, Aricept 10 mg at bedtime - Delirium precautions - Fall precautions   Chronic and Stable Problems:  HLD: Have not started home Zetia 10 mg daily, fenofibrate 160 mg daily (unclear if taking) Schizophrenia: Continue home clozapine 200 mg at bedtime  COPD: Dulera 2puff BID, albuterol as needed Osteoporosis: Have not ordered alendronate 70 mg once a week on Wednesdays per chart review Dementia: Memantine 5 mg twice daily, Aricept 10 mg at bedtime   FEN/GI: regular PPx: lovenox Dispo: pending clinical improvement   Subjective:  No acute events overnight. No concerns and asymptomatic at this time.   Objective: Temp:  [98.2 F (36.8 C)-98.5 F (36.9 C)] 98.5 F (36.9 C) (11/18 0802) Pulse Rate:  [85-88] 88 (11/18 0819) Resp:  [14-18] 16 (11/18 0819) BP: (137-152)/(59-72) 146/72 (11/18 0802) SpO2:  [97 %-99 %] 97 % (11/18 0819) Physical Exam: General: Resting comfortably, no acute distress Cardiovascular: well perfused Respiratory: no increased WOB on RA Extremities: LLE swelling unchanged from my prior exam. Ulcer on toe unchanged from prior exam. No erythema, warmth, or drainage from surgical dressing.  Laboratory: Most recent CBC Lab Results  Component Value Date   WBC 5.0 10/02/2023   HGB 7.9 (L) 10/02/2023   HCT 25.8 (L) 10/02/2023   MCV 91.5 10/02/2023   PLT 326 10/02/2023   Most recent BMP    Latest Ref  Rng & Units 10/02/2023    8:05 AM  BMP  Glucose 70 - 99 mg/dL 756   BUN 8 - 23 mg/dL 15   Creatinine 4.33 - 1.00 mg/dL 2.95   Sodium 188 - 416 mmol/L 143   Potassium 3.5 - 5.1 mmol/L 3.7   Chloride 98 - 111 mmol/L 112   CO2 22 - 32 mmol/L 24   Calcium 8.9 - 10.3 mg/dL 9.4    Braydan Marriott, DO 10/02/2023, 11:48 AM  PGY-1, [facility] FPTS Intern pager: [phone], text pages welcome Secure chat group [facility] Teaching Service

## 2023-10-02 NOTE — Assessment & Plan Note (Addendum)
Evaluated by psych and deemed to have capacity. Reiterated full code status this AM -Continue full code status

## 2023-10-02 NOTE — Assessment & Plan Note (Addendum)
 Alzheimer's dementia. - Memantine 5 mg twice daily, Aricept 10 mg at bedtime - Delirium precautions - Fall precautions

## 2023-10-02 NOTE — Assessment & Plan Note (Deleted)
A1c 6.1 in Sept 24. BGL stable. -Hold home metformin

## 2023-10-02 NOTE — Assessment & Plan Note (Addendum)
Noted during OT on 11/7. No issues since. Using TED hose and abdominal binder -PT/OT following

## 2023-10-02 NOTE — Plan of Care (Signed)
Problem: Education: Goal: Ability to describe self-care measures that may prevent or decrease complications (Diabetes Survival Skills Education) will improve 10/02/2023 0456 by Tish Frederickson, RN Outcome: Progressing 10/02/2023 0456 by Tish Frederickson, RN Outcome: Progressing Goal: Individualized Educational Video(s) 10/02/2023 0456 by Tish Frederickson, RN Outcome: Progressing 10/02/2023 0456 by Tish Frederickson, RN Outcome: Progressing   Problem: Coping: Goal: Ability to adjust to condition or change in health will improve 10/02/2023 0456 by Tish Frederickson, RN Outcome: Progressing 10/02/2023 0456 by Tish Frederickson, RN Outcome: Progressing   Problem: Fluid Volume: Goal: Ability to maintain a balanced intake and output will improve 10/02/2023 0456 by Tish Frederickson, RN Outcome: Progressing 10/02/2023 0456 by Tish Frederickson, RN Outcome: Progressing   Problem: Health Behavior/Discharge Planning: Goal: Ability to identify and utilize available resources and services will improve 10/02/2023 0456 by Tish Frederickson, RN Outcome: Progressing 10/02/2023 0456 by Tish Frederickson, RN Outcome: Progressing Goal: Ability to manage health-related needs will improve 10/02/2023 0456 by Tish Frederickson, RN Outcome: Progressing 10/02/2023 0456 by Tish Frederickson, RN Outcome: Progressing   Problem: Metabolic: Goal: Ability to maintain appropriate glucose levels will improve 10/02/2023 0456 by Tish Frederickson, RN Outcome: Progressing 10/02/2023 0456 by Tish Frederickson, RN Outcome: Progressing   Problem: Nutritional: Goal: Maintenance of adequate nutrition will improve 10/02/2023 0456 by Tish Frederickson, RN Outcome: Progressing 10/02/2023 0456 by Tish Frederickson, RN Outcome: Progressing Goal: Progress toward achieving an optimal weight will improve 10/02/2023 0456 by Tish Frederickson, RN Outcome: Progressing 10/02/2023 0456 by  Tish Frederickson, RN Outcome: Progressing   Problem: Skin Integrity: Goal: Risk for impaired skin integrity will decrease 10/02/2023 0456 by Tish Frederickson, RN Outcome: Progressing 10/02/2023 0456 by Tish Frederickson, RN Outcome: Progressing   Problem: Tissue Perfusion: Goal: Adequacy of tissue perfusion will improve 10/02/2023 0456 by Tish Frederickson, RN Outcome: Progressing 10/02/2023 0456 by Tish Frederickson, RN Outcome: Progressing   Problem: Education: Goal: Knowledge of General Education information will improve Description: Including pain rating scale, medication(s)/side effects and non-pharmacologic comfort measures 10/02/2023 0456 by Tish Frederickson, RN Outcome: Progressing 10/02/2023 0456 by Tish Frederickson, RN Outcome: Progressing   Problem: Health Behavior/Discharge Planning: Goal: Ability to manage health-related needs will improve 10/02/2023 0456 by Tish Frederickson, RN Outcome: Progressing 10/02/2023 0456 by Tish Frederickson, RN Outcome: Progressing   Problem: Clinical Measurements: Goal: Ability to maintain clinical measurements within normal limits will improve 10/02/2023 0456 by Tish Frederickson, RN Outcome: Progressing 10/02/2023 0456 by Tish Frederickson, RN Outcome: Progressing Goal: Will remain free from infection 10/02/2023 0456 by Tish Frederickson, RN Outcome: Progressing 10/02/2023 0456 by Tish Frederickson, RN Outcome: Progressing Goal: Diagnostic test results will improve 10/02/2023 0456 by Tish Frederickson, RN Outcome: Progressing 10/02/2023 0456 by Tish Frederickson, RN Outcome: Progressing Goal: Respiratory complications will improve 10/02/2023 0456 by Tish Frederickson, RN Outcome: Progressing 10/02/2023 0456 by Tish Frederickson, RN Outcome: Progressing Goal: Cardiovascular complication will be avoided 10/02/2023 0456 by Tish Frederickson, RN Outcome: Progressing 10/02/2023 0456 by Tish Frederickson, RN Outcome: Progressing   Problem: Activity: Goal: Risk for activity intolerance will decrease 10/02/2023 0456 by Tish Frederickson, RN Outcome: Progressing 10/02/2023 0456 by Tish Frederickson, RN Outcome: Progressing   Problem: Nutrition: Goal: Adequate nutrition will be maintained 10/02/2023 0456 by Tish Frederickson, RN Outcome: Progressing 10/02/2023 0456 by Tish Frederickson, RN Outcome: Progressing  Problem: Coping: Goal: Level of anxiety will decrease 10/02/2023 0456 by Tish Frederickson, RN Outcome: Progressing 10/02/2023 0456 by Tish Frederickson, RN Outcome: Progressing   Problem: Elimination: Goal: Will not experience complications related to bowel motility 10/02/2023 0456 by Tish Frederickson, RN Outcome: Progressing 10/02/2023 0456 by Tish Frederickson, RN Outcome: Progressing Goal: Will not experience complications related to urinary retention 10/02/2023 0456 by Tish Frederickson, RN Outcome: Progressing 10/02/2023 0456 by Tish Frederickson, RN Outcome: Progressing   Problem: Pain Management: Goal: General experience of comfort will improve 10/02/2023 0456 by Tish Frederickson, RN Outcome: Progressing 10/02/2023 0456 by Tish Frederickson, RN Outcome: Progressing   Problem: Safety: Goal: Ability to remain free from injury will improve 10/02/2023 0456 by Tish Frederickson, RN Outcome: Progressing 10/02/2023 0456 by Tish Frederickson, RN Outcome: Progressing   Problem: Skin Integrity: Goal: Risk for impaired skin integrity will decrease 10/02/2023 0456 by Tish Frederickson, RN Outcome: Progressing 10/02/2023 0456 by Tish Frederickson, RN Outcome: Progressing

## 2023-10-02 NOTE — Assessment & Plan Note (Addendum)
 Early acute osteomyelitis found on CT. Treatment plan as above.

## 2023-10-02 NOTE — Assessment & Plan Note (Addendum)
A1c 8/29 of 6.1.  Home medications include metformin 500 mg twice daily and she received SSI at SNF. CBGs have been stable since admission - SSI discontinued

## 2023-10-02 NOTE — Assessment & Plan Note (Addendum)
Hgb has been stable between 7-8.

## 2023-10-02 NOTE — Assessment & Plan Note (Addendum)
Noted on admission, appears stable on exam today. -Vaseline to feet BID

## 2023-10-03 DIAGNOSIS — F0390 Unspecified dementia without behavioral disturbance: Secondary | ICD-10-CM | POA: Diagnosis not present

## 2023-10-03 DIAGNOSIS — L89224 Pressure ulcer of left hip, stage 4: Secondary | ICD-10-CM | POA: Diagnosis not present

## 2023-10-03 DIAGNOSIS — M86152 Other acute osteomyelitis, left femur: Secondary | ICD-10-CM | POA: Diagnosis not present

## 2023-10-03 MED ORDER — CHLORHEXIDINE GLUCONATE CLOTH 2 % EX PADS
6.0000 | MEDICATED_PAD | Freq: Once | CUTANEOUS | Status: AC
  Administered 2023-10-03: 6 via TOPICAL

## 2023-10-03 NOTE — Plan of Care (Signed)
  Problem: Education: Goal: Ability to describe self-care measures that may prevent or decrease complications (Diabetes Survival Skills Education) will improve Outcome: Progressing Goal: Individualized Educational Video(s) Outcome: Progressing   

## 2023-10-03 NOTE — Assessment & Plan Note (Addendum)
 Alzheimer's dementia. - Memantine 5 mg twice daily, Aricept 10 mg at bedtime - Delirium precautions - Fall precautions

## 2023-10-03 NOTE — Assessment & Plan Note (Addendum)
A1c 8/29 of 6.1.  Home medications include metformin 500 mg twice daily and she received SSI at SNF. CBGs have been stable since admission - SSI discontinued

## 2023-10-03 NOTE — Progress Notes (Signed)
Daily Progress Note Intern Pager: [phone]  Patient name: Elizabeth Schwartz Medical record number: [id number] Date of birth: [birth year] Age: 77 y.o. Gender: female  Primary Care Provider: Storm Frisk, MD Consultants: Plastic surgery, ID  Code Status: Full code   Pt Overview and Major Events to Date:  11/3: Admitted to FMTS 11/11: I&D L thigh  Assessment and Plan:  77 year old female with PMHx HLD, schizophrenia, dementia, COPD, osteoporosis and T2DM who was admitted for left thigh wound after a crush injury and found to have imaging concerning for necrotizing myositis and early osteomyelitis. Treatment was delayed due to concern the patient lacked capacity to consent to surgery, the patient was later deemed to have capacity with formal psychiatry assessment. She does not have any known family involved. S/p I&D, pending more wound cultures data for ABX planning and repeat surgery on 11/20.  Will work to help obtain consent for surgery tomorrow. 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 Pain well controlled -Plastic surgery following -Daily dressing changes -Return to the OR 11/20 for further evaluation of the wound under anesthesia -ID following -Continue Meropenem -Pain management: Scheduled tylenol 650mg  q6h Osteomyelitis of left femur (HCC) Early acute osteomyelitis found on CT. Treatment plan as above. Skin ulcer of left great toe (HCC) Noted on admission, still appears stable on exam today. -Vaseline to feet BID Left leg swelling L foot and lower leg with ongoing edema present since admission noted to have warmth, erythema, and tenderness on exam 11/14. Low concern for infection as pt already on broad spectrum ABX. Also possible that pt has DVT, though less likely given she has been on daily lovenox. Most likely that this is sequalae of L thigh injury and surgery. -Consider  DVT doppler study LLE if symptoms worsen Normocytic anemia Hgb has been stable between 7-8. Orthostatic hypotension Noted during OT on 11/7. No issues since. Using TED hose and abdominal binder -PT/OT following Type 2 diabetes mellitus without complication (HCC) A1c 8/29 of 6.1.  Home medications include metformin 500 mg twice daily and she received SSI at SNF. CBGs have been stable since admission - SSI discontinued Goals of care, counseling/discussion Evaluated by psych and deemed to have capacity. Reiterated full code status this AM -Continue full code status  Dementia (HCC) Alzheimer's dementia. - Memantine 5 mg twice daily, Aricept 10 mg at bedtime - Delirium precautions - Fall precautions   Chronic and Stable Problems:  HLD: Have not started home Zetia 10 mg daily, fenofibrate 160 mg daily (unclear if taking) Schizophrenia: Continue home clozapine 200 mg at bedtime  COPD: Dulera 2puff BID, albuterol as needed Osteoporosis: Have not ordered alendronate 70 mg once a week on Wednesdays per chart review Dementia: Memantine 5 mg twice daily, Aricept 10 mg at bedtime   FEN/GI: regular PPx: lovenox Dispo: pending clinical improvement   Subjective:  No acute events overnight.  Patient has no concerns or complaints right now.  States that she is having surgery tomorrow to further look at her leg.  Objective: Temp:  [98.2 F (36.8 C)-99.1 F (37.3 C)] 98.2 F (36.8 C) (11/19 0820) Pulse Rate:  [92-97] 97 (11/19 0820) Resp:  [17-19] 17 (11/19 0820) BP: (128-140)/(51-69) 132/51 (11/19 0820) SpO2:  [93 %-99 %] 93 % (11/19 0920) Physical Exam: General: well appearing, sitting up in bed Respiratory: no increased WOB on RA Extremities: stable LLE swelling, no erythema, warmth or  tenderness. LLE wrap in place C/D/I. No swelling RLE. Unchanged ulcer to L toe seen previously in media tab  Laboratory: Most recent CBC Lab Results  Component Value Date   WBC 5.0 10/02/2023   HGB  7.9 (L) 10/02/2023   HCT 25.8 (L) 10/02/2023   MCV 91.5 10/02/2023   PLT 326 10/02/2023   Most recent BMP    Latest Ref Rng & Units 10/02/2023    8:05 AM  BMP  Glucose 70 - 99 mg/dL 161   BUN 8 - 23 mg/dL 15   Creatinine 0.96 - 1.00 mg/dL 0.45   Sodium 409 - 811 mmol/L 143   Potassium 3.5 - 5.1 mmol/L 3.7   Chloride 98 - 111 mmol/L 112   CO2 22 - 32 mmol/L 24   Calcium 8.9 - 10.3 mg/dL 9.4    Ji Feldner, DO 10/03/2023, 11:34 AM  PGY-1, [facility] FPTS Intern pager: [phone], text pages welcome Secure chat group [facility] Teaching Service

## 2023-10-03 NOTE — Assessment & Plan Note (Addendum)
2/2 crush injury now s/p I&D on 11/11 with plans for further debridement on 11/20 per Plastics. Pain well controlled -Plastic surgery following -Daily dressing changes -Return to the OR 11/20 for further evaluation of the wound under anesthesia -ID following -Continue Meropenem -Pain management: Scheduled tylenol 650mg  q6h

## 2023-10-03 NOTE — Assessment & Plan Note (Addendum)
Evaluated by psych and deemed to have capacity. Reiterated full code status this AM -Continue full code status

## 2023-10-03 NOTE — Progress Notes (Addendum)
Mobility Specialist: Progress Note   10/03/23 1629  Mobility  Activity Stood at bedside;Dangled on edge of bed (+ EOB exercises)  Assistive Device None  Range of Motion/Exercises Right leg;Left leg  Activity Response Tolerated fair  Mobility Referral Yes  $Mobility charge 1 Mobility  Mobility Specialist Start Time (ACUTE ONLY) 1604  Mobility Specialist Stop Time (ACUTE ONLY) 1626  Mobility Specialist Time Calculation (min) (ACUTE ONLY) 22 min    Pre-Mobility (Supine): 161/65 During Mobility (Stand): 136/75 During Mobility (Seated EOB, after exercise): 119/102 Post Mobility (Supine): 144/70  Pt was agreeable to mobility session - received in bed. SV for bed mobility, MinA for STS. Upon standing pt stated she was feeling very woosy, BP dropped to 136/75. Sat EOB and pt stated she was feeling a little better so opted to do EOB exercises. Performed 10x seated marches each leg, 10x knee kicks each leg, and 10x ankle pumps R foot - was unable to perform ankle pumps on L foot appears to be L foot drop but pt was unsure if she had it. BP 119/102 seated EOB after exercises so pt laid back in bed with assistance to scoot up. Left in bed with all needs met, call bell in reach - BP 144/70. Bed alarm on, RN notified.   Maurene Capes Mobility Specialist Please contact via SecureChat or Rehab office at [phone]

## 2023-10-03 NOTE — H&P (View-Only) (Signed)
Elizabeth Schwartz is now postop day 9 from her left thigh wound debridement and placement of myriad.  She is awake and alert this afternoon.  She states that she has no pain in the thigh.  She will be 10 days postop from her original surgery tomorrow.  It is time for removal of the Sorbact.  I have scheduled her for return to the operating room tomorrow afternoon for examination of the wound under anesthesia, debridement as needed, possible placement of additional myriad.  The patient indicates that she understands what I will be doing tomorrow and is in agreement.  There is still some hesitancy on the part of the nursing staff to consent her due to a question of her competency to completely understand and to give true informed consent.  I have therefore asked the primary care team to obtain the consent.  They have agreed.  Will proceed to the operating room tomorrow afternoon.

## 2023-10-03 NOTE — Progress Notes (Signed)
Elizabeth Schwartz is now postop day 9 from her left thigh wound debridement and placement of myriad.  She is awake and alert this afternoon.  She states that she has no pain in the thigh.  She will be 10 days postop from her original surgery tomorrow.  It is time for removal of the Sorbact.  I have scheduled her for return to the operating room tomorrow afternoon for examination of the wound under anesthesia, debridement as needed, possible placement of additional myriad.  The patient indicates that she understands what I will be doing tomorrow and is in agreement.  There is still some hesitancy on the part of the nursing staff to consent her due to a question of her competency to completely understand and to give true informed consent.  I have therefore asked the primary care team to obtain the consent.  They have agreed.  Will proceed to the operating room tomorrow afternoon.

## 2023-10-03 NOTE — Assessment & Plan Note (Addendum)
Noted during OT on 11/7. No issues since. Using TED hose and abdominal binder -PT/OT following

## 2023-10-03 NOTE — Assessment & Plan Note (Addendum)
L foot and lower leg with ongoing edema present since admission noted to have warmth, erythema, and tenderness on exam 11/14. Low concern for infection as pt already on broad spectrum ABX. Also possible that pt has DVT, though less likely given she has been on daily lovenox. Most likely that this is sequalae of L thigh injury and surgery. -Consider DVT doppler study LLE if symptoms worsen

## 2023-10-03 NOTE — Progress Notes (Addendum)
Physical Therapy Treatment Patient Details Name: Elizabeth Schwartz MRN: [id number] DOB: 1946-06-15 Today's Date: 10/03/2023   History of Present Illness Pt is a 77 y/o F admitted on 09/17/23 after being advised by [facility] nurse to go to the ED after assessing pt's wound. CT showed necrotic myositis, pt also found to have acute osteomyelitis. Pt found to have symptomatic orthostatic hypotension during therapies 11/6-11/8, and consistently following week.  S/P I&D 11/11. PMH: anemia, COPD, dementia, depression, DM, HLD, HTN, osteoporosis, schizo-affective, SOB, syncope.    PT Comments  Pt received in supine, sleeping but awoken to verbal encouragement, pt with dinner tray at bedside but pt states she will not eat the food that was sent. Pt limited due to fatigue after recently returning to bed, and no TED hose/abdominal binder donned, able to assist some with posterior supine scooting toward HOB and long sitting in bed, pt defers EOB/OOB due to fatigue. Pt agreeable to sit in upright bed chair posture and dinner tray set up so pt can eat fruit/salad while awaiting other order to arrive for protein portion of meal and unit secretary notified she will need abdominal binder ordered as none seen in room. Pt has been orthostatic most PT/Mobility sessions for past 2 weeks and likely to need abdominal binder to progress standing tolerance. Pt will continue to benefit from skilled rehab in a post acute setting <3 hours/day to maximize functional gains before returning home.    If plan is discharge home, recommend the following: A little help with bathing/dressing/bathroom;Assistance with cooking/housework;Supervision due to cognitive status;Direct supervision/assist for financial management;Assist for transportation;Help with stairs or ramp for entrance;Direct supervision/assist for medications management;A lot of help with walking and/or transfers   Can travel by private vehicle     Yes  Equipment Recommendations   Rolling walker (2 wheels);BSC/3in1 (may need WC pending progress, orthostatic frequently)    Recommendations for Other Services       Precautions / Restrictions Precautions Precautions: Fall Precaution Comments: watch BP, has B thigh high TED hose Required Braces or Orthoses: Other Brace Other Brace: unit secretary asked to order abdominal binder for her, order was placed on 11/8 but none in room today Restrictions Weight Bearing Restrictions: No     Mobility  Bed Mobility Overal bed mobility: Needs Assistance Bed Mobility: Rolling, Supine to Sit Rolling: Min assist   Supine to sit: HOB elevated, Used rails, Min assist     General bed mobility comments: Slow to initiate, cues for sequencing, minA for supine to long sitting using side rails. Pt defers EOB/OOB due to fatigue but agrees to bed being placed in chair posture. MaxA for posterior supine scooting toward [facility] with use of overhead rails and cues for BLE placement/assist.    Transfers Overall transfer level: Needs assistance                 General transfer comment: pt defers, recently returned to supine from chair.    Ambulation/Gait               General Gait Details: defer, pt wanting to eat and no TED hose/abdominal binder donned, also recently returned to bed from chair and pt c/o fatigue.   Stairs             Wheelchair Mobility     Tilt Bed    Modified Rankin (Stroke Patients Only)       Balance Overall balance assessment: Needs assistance Sitting-balance support: Feet supported, Bilateral upper extremity supported Sitting balance-Leahy  Scale: Fair Sitting balance - Comments: supervision static sitting with bil rail support   Standing balance support: Bilateral upper extremity supported, During functional activity, Single extremity supported   Standing balance comment: defer today                            Cognition Arousal: Alert Behavior During Therapy: Flat  affect Overall Cognitive Status: History of cognitive impairments - at baseline                                 General Comments: Follows 1 step commands, difficulty with multi step commands and problem solving. Has difficulty sequencing functional activities even with vc's. Pt assisted to set up dinner tray then refusing to eat anything, stating she does not eat fish, rice or carrots. After discussion, pt able to indicate something she will eat and PTA called down to cafeteria for her as pt unable to dial number on landline phone or recall order. Pt does agree to eat salad from dinner tray in meantime after PTA reinforced that she will be NPO at midnight and depending on surgery time tomorrow, could be almost 24 hours before she eats again.        Exercises Other Exercises Other Exercises: AP's in long sitting (LLE no significant AROM, reinforced for pt to attempt as able, better ROM on RLE), supine L/RLE quad sets x5 reps ea with tactile cues    General Comments        Pertinent Vitals/Pain Pain Assessment Pain Assessment: Faces Faces Pain Scale: Hurts little more Pain Location: L thigh, L shoulder Pain Descriptors / Indicators: Grimacing, Discomfort, Guarding Pain Intervention(s): Limited activity within patient's tolerance, Monitored during session, Repositioned     PT Goals (current goals can now be found in the care plan section) Acute Rehab PT Goals Patient Stated Goal: none stated PT Goal Formulation: With patient Time For Goal Achievement: 10/02/23 Progress towards PT goals: Progressing toward goals (slowly)    Frequency    Min 1X/week      PT Plan         AM-PAC PT "6 Clicks" Mobility   Outcome Measure  Help needed turning from your back to your side while in a flat bed without using bedrails?: A Little Help needed moving from lying on your back to sitting on the side of a flat bed without using bedrails?: A Lot Help needed moving to and from a  bed to a chair (including a wheelchair)?: A Lot Help needed standing up from a chair using your arms (e.g., wheelchair or bedside chair)?: A Lot Help needed to walk in hospital room?: A Lot Help needed climbing 3-5 steps with a railing? : Total 6 Click Score: 12    End of Session Equipment Utilized During Treatment: Gait belt Activity Tolerance: Patient limited by pain;Patient limited by lethargy Patient left: with call bell/phone within reach;in bed;with bed alarm set;with SCD's reapplied;Other (comment) (bed in chair posture, pt set up to eat from her dinner tray) Nurse Communication: Mobility status;Other (comment) (no abdominal binder in room, secretary asked to order her one) PT Visit Diagnosis: Muscle weakness (generalized) (M62.81);Pain;Other abnormalities of gait and mobility (R26.89);Difficulty in walking, not elsewhere classified (R26.2) Pain - Right/Left: Left Pain - part of body: Leg;Shoulder     Time: 0454-0981 PT Time Calculation (min) (ACUTE ONLY): 12 min  Charges:    $  Therapeutic Activity: 8-22 mins PT General Charges $$ ACUTE PT VISIT: 1 Visit                     Norberta Stobaugh P., PTA Acute Rehabilitation Services Secure Chat Preferred 9a-5:30pm Office: [phone]    Dorathy Kinsman [facility] 10/03/2023, 7:29 PM

## 2023-10-03 NOTE — Assessment & Plan Note (Addendum)
Hgb has been stable between 7-8.

## 2023-10-03 NOTE — Assessment & Plan Note (Addendum)
 Early acute osteomyelitis found on CT. Treatment plan as above.

## 2023-10-03 NOTE — Assessment & Plan Note (Addendum)
Noted on admission, still appears stable on exam today. -Vaseline to feet BID

## 2023-10-04 ENCOUNTER — Encounter (HOSPITAL_COMMUNITY)
Admission: EM | Disposition: A | Discharge status: Skilled Nursing Facility | Payer: Self-pay | Source: Home / Self Care | Attending: Family Medicine

## 2023-10-04 ENCOUNTER — Inpatient Hospital Stay (HOSPITAL_COMMUNITY): Payer: Medicare Other | Admitting: Anesthesiology

## 2023-10-04 ENCOUNTER — Encounter (HOSPITAL_COMMUNITY): Payer: Self-pay | Admitting: Student

## 2023-10-04 ENCOUNTER — Other Ambulatory Visit: Payer: Self-pay

## 2023-10-04 DIAGNOSIS — S71102D Unspecified open wound, left thigh, subsequent encounter: Secondary | ICD-10-CM

## 2023-10-04 DIAGNOSIS — M86152 Other acute osteomyelitis, left femur: Secondary | ICD-10-CM | POA: Diagnosis not present

## 2023-10-04 DIAGNOSIS — L89224 Pressure ulcer of left hip, stage 4: Secondary | ICD-10-CM | POA: Diagnosis not present

## 2023-10-04 DIAGNOSIS — S81802A Unspecified open wound, left lower leg, initial encounter: Secondary | ICD-10-CM | POA: Diagnosis not present

## 2023-10-04 DIAGNOSIS — M6080 Other myositis, unspecified site: Secondary | ICD-10-CM | POA: Diagnosis not present

## 2023-10-04 HISTORY — PX: INCISION AND DRAINAGE OF WOUND: SHX1803

## 2023-10-04 LAB — CBC
HCT: 25.2 % — ABNORMAL LOW (ref 36.0–46.0)
Hemoglobin: 7.6 g/dL — ABNORMAL LOW (ref 12.0–15.0)
MCH: 27.5 pg (ref 26.0–34.0)
MCHC: 30.2 g/dL (ref 30.0–36.0)
MCV: 91.3 fL (ref 80.0–100.0)
Platelets: 307 10*3/uL (ref 150–400)
RBC: 2.76 MIL/uL — ABNORMAL LOW (ref 3.87–5.11)
RDW: 20.1 % — ABNORMAL HIGH (ref 11.5–15.5)
WBC: 5.4 10*3/uL (ref 4.0–10.5)
nRBC: 0 % (ref 0.0–0.2)

## 2023-10-04 LAB — GLUCOSE, CAPILLARY
Glucose-Capillary: 96 mg/dL (ref 70–99)
Glucose-Capillary: 97 mg/dL (ref 70–99)

## 2023-10-04 SURGERY — IRRIGATION AND DEBRIDEMENT WOUND
Anesthesia: General | Site: Thigh | Laterality: Left

## 2023-10-04 MED ORDER — PHENYLEPHRINE 80 MCG/ML (10ML) SYRINGE FOR IV PUSH (FOR BLOOD PRESSURE SUPPORT)
PREFILLED_SYRINGE | INTRAVENOUS | Status: DC | PRN
  Administered 2023-10-04 (×3): 160 ug via INTRAVENOUS
  Administered 2023-10-04: 80 ug via INTRAVENOUS
  Administered 2023-10-04: 160 ug via INTRAVENOUS
  Administered 2023-10-04: 80 ug via INTRAVENOUS

## 2023-10-04 MED ORDER — OXYCODONE HCL 5 MG PO TABS: 5.0000 mg | ORAL_TABLET | Freq: Once | ORAL | Status: DC | PRN

## 2023-10-04 MED ORDER — HYDROMORPHONE HCL 1 MG/ML IJ SOLN
INTRAMUSCULAR | Status: DC | PRN
Start: 2023-10-04 — End: 2023-10-04
  Administered 2023-10-04 (×2): .25 mg via INTRAVENOUS

## 2023-10-04 MED ORDER — PROPOFOL 10 MG/ML IV BOLUS
INTRAVENOUS | Status: DC | PRN
  Administered 2023-10-04: 100 mg via INTRAVENOUS

## 2023-10-04 MED ORDER — LACTATED RINGERS IV SOLN: INTRAVENOUS | Status: DC

## 2023-10-04 MED ORDER — OXYCODONE HCL 5 MG/5ML PO SOLN: 5.0000 mg | Freq: Once | ORAL | Status: DC | PRN

## 2023-10-04 MED ORDER — MIDAZOLAM HCL 2 MG/2ML IJ SOLN
INTRAMUSCULAR | Status: AC
  Filled 2023-10-04: qty 2

## 2023-10-04 MED ORDER — ORAL CARE MOUTH RINSE: 15.0000 mL | Freq: Once | OROMUCOSAL | Status: AC

## 2023-10-04 MED ORDER — CHLORHEXIDINE GLUCONATE 0.12 % MT SOLN: 15.0000 mL | Freq: Once | OROMUCOSAL | Status: AC

## 2023-10-04 MED ORDER — PROPOFOL 10 MG/ML IV BOLUS
INTRAVENOUS | Status: AC
Start: 2023-10-04 — End: ?
  Filled 2023-10-04: qty 20

## 2023-10-04 MED ORDER — DEXMEDETOMIDINE HCL IN NACL 80 MCG/20ML IV SOLN
INTRAVENOUS | Status: AC
  Filled 2023-10-04: qty 20

## 2023-10-04 MED ORDER — HYDROMORPHONE HCL 1 MG/ML IJ SOLN
INTRAMUSCULAR | Status: AC
  Filled 2023-10-04: qty 0.5

## 2023-10-04 MED ORDER — FENTANYL CITRATE (PF) 100 MCG/2ML IJ SOLN: 25.0000 ug | INTRAMUSCULAR | Status: DC | PRN

## 2023-10-04 MED ORDER — ACETAMINOPHEN 500 MG PO TABS
ORAL_TABLET | ORAL | Status: AC
  Administered 2023-10-04: 1000 mg via ORAL
  Filled 2023-10-04: qty 2

## 2023-10-04 MED ORDER — CHLORHEXIDINE GLUCONATE 0.12 % MT SOLN
OROMUCOSAL | Status: AC
  Administered 2023-10-04: 15 mL via OROMUCOSAL
  Filled 2023-10-04: qty 15

## 2023-10-04 MED ORDER — DROPERIDOL 2.5 MG/ML IJ SOLN: 0.6250 mg | Freq: Once | INTRAMUSCULAR | Status: DC | PRN

## 2023-10-04 MED ORDER — LIDOCAINE 2% (20 MG/ML) 5 ML SYRINGE
INTRAMUSCULAR | Status: DC | PRN
  Administered 2023-10-04: 100 mg via INTRAVENOUS

## 2023-10-04 MED ORDER — DEXAMETHASONE SODIUM PHOSPHATE 10 MG/ML IJ SOLN
INTRAMUSCULAR | Status: DC | PRN
  Administered 2023-10-04: 10 mg via INTRAVENOUS

## 2023-10-04 MED ORDER — ACETAMINOPHEN 500 MG PO TABS: 1000.0000 mg | ORAL_TABLET | Freq: Once | ORAL | Status: AC

## 2023-10-04 MED ORDER — ONDANSETRON HCL 4 MG/2ML IJ SOLN
INTRAMUSCULAR | Status: DC | PRN
  Administered 2023-10-04: 4 mg via INTRAVENOUS

## 2023-10-04 MED ORDER — 0.9 % SODIUM CHLORIDE (POUR BTL) OPTIME
TOPICAL | Status: DC | PRN
  Administered 2023-10-04: 1000 mL

## 2023-10-04 SURGICAL SUPPLY — 52 items
APPLICATOR COTTON TIP 6 STRL (MISCELLANEOUS) IMPLANT
APPLICATOR COTTON TIP 6IN STRL (MISCELLANEOUS) IMPLANT
BAG COUNTER SPONGE SURGICOUNT (BAG) ×2 IMPLANT
BAG DECANTER FOR FLEXI CONT (MISCELLANEOUS) IMPLANT
BENZOIN TINCTURE PRP APPL 2/3 (GAUZE/BANDAGES/DRESSINGS) ×2 IMPLANT
BNDG ELASTIC 6INX 5YD STR LF (GAUZE/BANDAGES/DRESSINGS) IMPLANT
BNDG GAUZE DERMACEA FLUFF 4 (GAUZE/BANDAGES/DRESSINGS) IMPLANT
CANISTER SUCT 3000ML PPV (MISCELLANEOUS) ×2 IMPLANT
CNTNR URN SCR LID CUP LEK RST (MISCELLANEOUS) IMPLANT
COVER SURGICAL LIGHT HANDLE (MISCELLANEOUS) ×2 IMPLANT
DRAIN CHANNEL 19F RND (DRAIN) IMPLANT
DRAIN JP 10F RND SILICONE (MISCELLANEOUS) IMPLANT
DRAPE HALF SHEET 40X57 (DRAPES) IMPLANT
DRAPE IMP U-DRAPE 54X76 (DRAPES) ×2 IMPLANT
DRAPE INCISE IOBAN 66X45 STRL (DRAPES) IMPLANT
DRAPE LAPAROSCOPIC ABDOMINAL (DRAPES) IMPLANT
DRAPE LAPAROTOMY 100X72 PEDS (DRAPES) ×2 IMPLANT
DRSG ADAPTIC 3X8 NADH LF (GAUZE/BANDAGES/DRESSINGS) IMPLANT
DRSG CALCIUM ALGINATE 4X4 (GAUZE/BANDAGES/DRESSINGS) IMPLANT
DRSG VAC GRANUFOAM LG (GAUZE/BANDAGES/DRESSINGS) IMPLANT
DRSG VAC GRANUFOAM MED (GAUZE/BANDAGES/DRESSINGS) IMPLANT
DRSG VAC GRANUFOAM SM (GAUZE/BANDAGES/DRESSINGS) IMPLANT
ELECT CAUTERY BLADE 6.4 (BLADE) IMPLANT
ELECT REM PT RETURN 9FT ADLT (ELECTROSURGICAL) ×1
ELECTRODE REM PT RTRN 9FT ADLT (ELECTROSURGICAL) ×2 IMPLANT
GAUZE PAD ABD 8X10 STRL (GAUZE/BANDAGES/DRESSINGS) IMPLANT
GAUZE SPONGE 4X4 12PLY STRL (GAUZE/BANDAGES/DRESSINGS) IMPLANT
GLOVE BIO SURGEON STRL SZ8 (GLOVE) ×2 IMPLANT
GLOVE BIOGEL PI IND STRL 8 (GLOVE) ×2 IMPLANT
GOWN STRL REUS W/ TWL LRG LVL3 (GOWN DISPOSABLE) ×6 IMPLANT
GOWN STRL REUS W/TWL XL LVL3 (GOWN DISPOSABLE) ×2 IMPLANT
KIT BASIN OR (CUSTOM PROCEDURE TRAY) ×2 IMPLANT
KIT TURNOVER KIT B (KITS) ×2 IMPLANT
NDL HYPO 25GX1X1/2 BEV (NEEDLE) ×2 IMPLANT
NEEDLE HYPO 25GX1X1/2 BEV (NEEDLE) ×1 IMPLANT
NS IRRIG 1000ML POUR BTL (IV SOLUTION) ×2 IMPLANT
PACK GENERAL/GYN (CUSTOM PROCEDURE TRAY) ×2 IMPLANT
PACK UNIVERSAL I (CUSTOM PROCEDURE TRAY) ×2 IMPLANT
PAD ARMBOARD 7.5X6 YLW CONV (MISCELLANEOUS) ×4 IMPLANT
STAPLER VISISTAT 35W (STAPLE) ×2 IMPLANT
SURGILUBE 2OZ TUBE FLIPTOP (MISCELLANEOUS) IMPLANT
SUT MNCRL AB 3-0 PS2 27 (SUTURE) IMPLANT
SUT MNCRL AB 4-0 PS2 18 (SUTURE) IMPLANT
SUT MON AB 2-0 CT1 36 (SUTURE) IMPLANT
SUT MON AB 5-0 PS2 18 (SUTURE) IMPLANT
SUT VIC AB 5-0 PS2 18 (SUTURE) IMPLANT
SUT VICRYL 3 0 (SUTURE) IMPLANT
SWAB COLLECTION DEVICE MRSA (MISCELLANEOUS) IMPLANT
SWAB CULTURE ESWAB REG 1ML (MISCELLANEOUS) IMPLANT
SYR CONTROL 10ML LL (SYRINGE) ×2 IMPLANT
TOWEL GREEN STERILE (TOWEL DISPOSABLE) ×2 IMPLANT
UNDERPAD 30X36 HEAVY ABSORB (UNDERPADS AND DIAPERS) ×2 IMPLANT

## 2023-10-04 NOTE — Assessment & Plan Note (Addendum)
Evaluated by psych and deemed to have capacity. Reiterated full code status this AM -Continue full code status

## 2023-10-04 NOTE — Interval H&P Note (Signed)
History and Physical Interval Note: No change in exam or indication for surgery Site marked. Patient doesn't have any questions, seems to comprehend what we are doing and why. Will proceed at her request  10/04/2023 4:07 PM  Elizabeth Schwartz  has presented today for surgery, with the diagnosis of LEG WOUND.  The various methods of treatment have been discussed with the patient and family. After consideration of risks, benefits and other options for treatment, the patient has consented to  Procedure(s): left thigh wound evaluation under anesthesia, debridement, removal of Sorbact, possible myriad placement (Left) as a surgical intervention.  The patient's history has been reviewed, patient examined, no change in status, stable for surgery.  I have reviewed the patient's chart and labs.  Questions were answered to the patient's satisfaction.     Santiago Glad

## 2023-10-04 NOTE — Progress Notes (Signed)
Daily Progress Note Intern Pager: [phone]  Patient name: Elizabeth Schwartz Medical record number: [id number] Date of birth: Mar 21, 1946 Age: 77 y.o. Gender: female  Primary Care Provider: Storm Frisk, MD Consultants: Plastic surgery, ID  Code Status: Full code   Pt Overview and Major Events to Date:  11/3: Admitted to FMTS 11/11: I&D L thigh  Assessment and Plan:  77 year old female with PMHx HLD, schizophrenia, dementia, COPD, osteoporosis and T2DM who was admitted for left thigh wound after a crush injury and found to have imaging concerning for necrotizing myositis and early osteomyelitis. Repeat surgery today with plastic surgery at 4pm. 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 Pain well controlled -Plastic surgery following -Daily dressing changes -Return to the OR 11/20 for further evaluation of the wound under anesthesia -ID following -Continue Meropenem -Pain management: Scheduled tylenol 650mg  q6h Osteomyelitis of left femur (HCC) Early acute osteomyelitis found on CT. Treatment plan as above. Skin ulcer of left great toe (HCC) Noted on admission, still appears stable on exam today. -Vaseline to feet BID Left leg swelling L foot and lower leg with ongoing edema present since admission noted to have warmth, erythema, and tenderness on exam 11/14. Low concern for infection as pt already on broad spectrum ABX. Also possible that pt has DVT, though less likely given she has been on daily lovenox. Most likely that this is sequalae of L thigh injury and surgery. -Consider DVT doppler study LLE if symptoms worsen Normocytic anemia Hgb has been stable between 7-8. Orthostatic hypotension Noted during OT on 11/7. No issues since. Using TED hose and abdominal binder -PT/OT following Type 2 diabetes mellitus without complication (HCC) A1c 8/29 of 6.1.  Home medications  include metformin 500 mg twice daily and she received SSI at SNF. CBGs have been stable since admission - SSI discontinued Goals of care, counseling/discussion Evaluated by psych and deemed to have capacity. Reiterated full code status this AM -Continue full code status  Dementia (HCC) Alzheimer's dementia. - Memantine 5 mg twice daily, Aricept 10 mg at bedtime - Delirium precautions - Fall precautions   Chronic and Stable Problems:  HLD: Have not started home Zetia 10 mg daily, fenofibrate 160 mg daily (unclear if taking) Schizophrenia: Continue home clozapine 200 mg at bedtime  COPD: Dulera 2puff BID, albuterol as needed Osteoporosis: Have not ordered alendronate 70 mg once a week on Wednesdays per chart review Dementia: Memantine 5 mg twice daily, Aricept 10 mg at bedtime   FEN/GI: NPO PPx: holding lovenox pre surgery Dispo:Home pending clinical improvement   Subjective:  No acute events overnight, awaiting surgery today.  Able to tell me what surgery she is having.  Objective: Temp:  [97.8 F (36.6 C)-98.7 F (37.1 C)] 98.4 F (36.9 C) (11/20 0902) Pulse Rate:  [88-96] 88 (11/20 0902) Resp:  [16-18] 16 (11/20 0902) BP: (132-158)/(53-72) 142/72 (11/20 0902) SpO2:  [93 %-98 %] 98 % (11/20 0902) Physical Exam: General: Well-appearing, no acute distress Respiratory: No increased work of breathing on room air Extremities: Unchanged from yesterday's exam  Laboratory: Most recent CBC Lab Results  Component Value Date   WBC 5.4 10/04/2023   HGB 7.6 (L) 10/04/2023   HCT 25.2 (L) 10/04/2023   MCV 91.3 10/04/2023   PLT 307 10/04/2023   Most recent BMP    Latest Ref Rng & Units 10/02/2023    8:05 AM  BMP  Glucose 70 - 99 mg/dL 409   BUN 8 - 23 mg/dL 15   Creatinine 8.11 - 1.00 mg/dL 9.14   Sodium 782 - 956 mmol/L 143   Potassium 3.5 - 5.1 mmol/L 3.7   Chloride 98 - 111 mmol/L 112   CO2 22 - 32 mmol/L 24   Calcium 8.9 - 10.3 mg/dL 9.4    Melinda Gwinner,  DO 10/04/2023, 11:38 AM  PGY-1, [facility] FPTS Intern pager: [phone], text pages welcome Secure chat group [facility] Teaching Service

## 2023-10-04 NOTE — Assessment & Plan Note (Addendum)
Noted on admission, still appears stable on exam today. -Vaseline to feet BID

## 2023-10-04 NOTE — Assessment & Plan Note (Addendum)
 Alzheimer's dementia. - Memantine 5 mg twice daily, Aricept 10 mg at bedtime - Delirium precautions - Fall precautions

## 2023-10-04 NOTE — Assessment & Plan Note (Addendum)
A1c 8/29 of 6.1.  Home medications include metformin 500 mg twice daily and she received SSI at SNF. CBGs have been stable since admission - SSI discontinued

## 2023-10-04 NOTE — Transfer of Care (Signed)
Immediate Anesthesia Transfer of Care Note  Patient: Elizabeth Schwartz  Procedure(s) Performed: left thigh wound evaluation under anesthesia, debridement, removal of Sorbact (Left: Thigh)  Patient Location: PACU  Anesthesia Type:General  Level of Consciousness: awake, alert , and oriented  Airway & Oxygen Therapy: Patient Spontanous Breathing and Patient connected to nasal cannula oxygen  Post-op Assessment: Report given to RN and Post -op Vital signs reviewed and stable  Post vital signs: Reviewed and stable  Last Vitals:  Vitals Value Taken Time  BP 125/59 10/04/23 1715  Temp 36.6 C 10/04/23 1715  Pulse 87 10/04/23 1716  Resp 10 10/04/23 1716  SpO2 97 % 10/04/23 1716  Vitals shown include unfiled device data.  Last Pain:  Vitals:   10/04/23 1504  TempSrc: Oral  PainSc: 0-No pain      Patients Stated Pain Goal: 0 (10/04/23 1504)  Complications: No notable events documented.

## 2023-10-04 NOTE — Plan of Care (Signed)
I spoke with Dr. Ladona Ridgel via secure chat regarding consent for patient surgery today.  He requested that I assist with the consent for her surgery instead of the nursing team, noting that Dr. Deirdre Priest previously consented the patient.  This morning I discussed the risks of infection, bleeding, injury to surrounding tissues and muscles, and death.  Patient was able to verbalize these risks back to me.  She was also able to tell me what surgery she was getting today.  I did not discuss any additional risks as I do not know all of the details and potential risks of the surgery she is having.  Consent was signed by Elizabeth Schwartz and myself.  See Dr. Lubertha Basque note regarding his own consent process with her. I messaged TOC regarding how to go about getting her a legal guardian.

## 2023-10-04 NOTE — Assessment & Plan Note (Addendum)
 Early acute osteomyelitis found on CT. Treatment plan as above.

## 2023-10-04 NOTE — Assessment & Plan Note (Addendum)
Hgb has been stable between 7-8.

## 2023-10-04 NOTE — TOC Progression Note (Signed)
Transition of Care [facility]) - Progression Note    Patient Details  Name: Lucinda Soliday MRN: [id number] Date of Birth: 06/23/1946  Transition of Care [facility]) CM/SW Contact  Lorri Frederick, LCSW Phone Number: 10/04/2023, 10:29 AM  Clinical Narrative:   TOC continues to follow pt for eventual SNF placement.      Expected Discharge Plan: Skilled Nursing Facility Barriers to Discharge: SNF Pending bed offer  Expected Discharge Plan and Services In-house Referral: Clinical Social Work   Post Acute Care Choice: Skilled Nursing Facility Living arrangements for the past 2 months: Single Family Home                                       Social Determinants of Health (SDOH) Interventions SDOH Screenings   Food Insecurity: No Food Insecurity (09/18/2023)  Housing: Patient Unable To Answer (09/18/2023)  Transportation Needs: No Transportation Needs (09/18/2023)  Utilities: Not At Risk (09/18/2023)  Alcohol Screen: Low Risk  (03/22/2023)  Depression (PHQ2-9): Low Risk  (07/13/2023)  Financial Resource Strain: Low Risk  (03/22/2023)  Physical Activity: Insufficiently Active (03/22/2023)  Social Connections: Socially Isolated (03/22/2023)  Stress: No Stress Concern Present (03/22/2023)  Tobacco Use: Medium Risk (09/25/2023)    Readmission Risk Interventions     No data to display

## 2023-10-04 NOTE — Assessment & Plan Note (Addendum)
L foot and lower leg with ongoing edema present since admission noted to have warmth, erythema, and tenderness on exam 11/14. Low concern for infection as pt already on broad spectrum ABX. Also possible that pt has DVT, though less likely given she has been on daily lovenox. Most likely that this is sequalae of L thigh injury and surgery. -Consider DVT doppler study LLE if symptoms worsen

## 2023-10-04 NOTE — Progress Notes (Signed)
OT Cancellation Note  Patient Details Name: Elizabeth Schwartz MRN: [id number] DOB: 12-Oct-1946   Cancelled Treatment:    Reason Eval/Treat Not Completed: Fatigue/lethargy limiting ability to participate- pt asleep upon entry, awakens to name but declines OOB at this time and falling back asleep immediately. Noted plan to surgery this afternoon. Will follow and see as able.   Barry Brunner, OT Acute Rehabilitation Services Office [phone]   Chancy Milroy 10/04/2023, 9:34 AM

## 2023-10-04 NOTE — Assessment & Plan Note (Addendum)
Noted during OT on 11/7. No issues since. Using TED hose and abdominal binder -PT/OT following

## 2023-10-04 NOTE — Anesthesia Procedure Notes (Addendum)
Procedure Name: LMA Insertion Date/Time: 10/04/2023 4:36 PM  Performed by: Jimmey Ralph, CRNAPre-anesthesia Checklist: Patient identified, Emergency Drugs available, Suction available and Patient being monitored Patient Re-evaluated:Patient Re-evaluated prior to induction Oxygen Delivery Method: Circle system utilized Preoxygenation: Pre-oxygenation with 100% oxygen Induction Type: IV induction Ventilation: Mask ventilation without difficulty LMA: LMA inserted LMA Size: 4.0 Number of attempts: 2 Airway Equipment and Method: Stylet and Oral airway Placement Confirmation: ETT inserted through vocal cords under direct vision, positive ETCO2 and breath sounds checked- equal and bilateral Tube secured with: Tape Dental Injury: Teeth and Oropharynx as per pre-operative assessment

## 2023-10-04 NOTE — Assessment & Plan Note (Addendum)
2/2 crush injury now s/p I&D on 11/11 with plans for further debridement on 11/20 per Plastics. Pain well controlled -Plastic surgery following -Daily dressing changes -Return to the OR 11/20 for further evaluation of the wound under anesthesia -ID following -Continue Meropenem -Pain management: Scheduled tylenol 650mg  q6h

## 2023-10-04 NOTE — Anesthesia Preprocedure Evaluation (Addendum)
Anesthesia Evaluation  Patient identified by MRN, date of birth, ID band Patient awake    Reviewed: Allergy & Precautions, NPO status , Patient's Chart, lab work & pertinent test results  History of Anesthesia Complications Negative for: history of anesthetic complications  Airway Mallampati: II  TM Distance: >3 FB Neck ROM: Full    Dental  (+) Missing,    Pulmonary asthma , COPD, former smoker   Pulmonary exam normal        Cardiovascular hypertension, Normal cardiovascular exam     Neuro/Psych    Depression  Schizophrenia Dementia    GI/Hepatic ,GERD  Medicated,,  Endo/Other  diabetes, Type 2, Oral Hypoglycemic Agents    Renal/GU      Musculoskeletal   Abdominal   Peds  Hematology  (+) Blood dyscrasia (Hgb 7.6), anemia   Anesthesia Other Findings Left thigh wound  Reproductive/Obstetrics                             Anesthesia Physical Anesthesia Plan  ASA: 3  Anesthesia Plan: General   Post-op Pain Management: Tylenol PO (pre-op)*   Induction: Intravenous  PONV Risk Score and Plan: 3 and Treatment may vary due to age or medical condition, Dexamethasone and Ondansetron  Airway Management Planned: LMA  Additional Equipment: None  Intra-op Plan:   Post-operative Plan: Extubation in OR  Informed Consent: I have reviewed the patients History and Physical, chart, labs and discussed the procedure including the risks, benefits and alternatives for the proposed anesthesia with the patient or authorized representative who has indicated his/her understanding and acceptance.     Dental advisory given  Plan Discussed with: CRNA  Anesthesia Plan Comments:         Anesthesia Quick Evaluation
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
## 2023-10-05 ENCOUNTER — Ambulatory Visit: Payer: Medicare Other | Admitting: Podiatry

## 2023-10-05 ENCOUNTER — Encounter (HOSPITAL_COMMUNITY): Payer: Self-pay | Admitting: Plastic Surgery

## 2023-10-05 DIAGNOSIS — L89224 Pressure ulcer of left hip, stage 4: Secondary | ICD-10-CM | POA: Diagnosis not present

## 2023-10-05 DIAGNOSIS — M60852 Other myositis, left thigh: Secondary | ICD-10-CM | POA: Diagnosis not present

## 2023-10-05 LAB — CBC
HCT: 27.3 % — ABNORMAL LOW (ref 36.0–46.0)
Hemoglobin: 8.4 g/dL — ABNORMAL LOW (ref 12.0–15.0)
MCH: 27.5 pg (ref 26.0–34.0)
MCHC: 30.8 g/dL (ref 30.0–36.0)
MCV: 89.5 fL (ref 80.0–100.0)
Platelets: 341 10*3/uL (ref 150–400)
RBC: 3.05 MIL/uL — ABNORMAL LOW (ref 3.87–5.11)
RDW: 19.9 % — ABNORMAL HIGH (ref 11.5–15.5)
WBC: 4.1 10*3/uL (ref 4.0–10.5)
nRBC: 0 % (ref 0.0–0.2)

## 2023-10-05 MED ORDER — ENOXAPARIN SODIUM 40 MG/0.4ML IJ SOSY
40.0000 mg | PREFILLED_SYRINGE | INTRAMUSCULAR | Status: DC
  Administered 2023-10-05 – 2023-10-16 (×12): 40 mg via SUBCUTANEOUS
  Filled 2023-10-05 (×12): qty 0.4

## 2023-10-05 NOTE — Assessment & Plan Note (Addendum)
L foot and lower leg with ongoing edema present since admission noted to have warmth, erythema, and tenderness on exam 11/14. Low concern for infection as pt already on broad spectrum ABX. Also possible that pt has DVT, though less likely given she has been on daily lovenox. Most likely that this is sequalae of L thigh injury and surgery. -Consider DVT doppler study LLE if symptoms worsen

## 2023-10-05 NOTE — Assessment & Plan Note (Signed)
Noted on admission, still appears stable -Vaseline to feet BID

## 2023-10-05 NOTE — Progress Notes (Signed)
Occupational Therapy Treatment Patient Details Name: Elizabeth Schwartz MRN: [id number] DOB: 05/07/1946 Today's Date: 10/05/2023   History of present illness Pt is a 77 y/o F admitted on 09/17/23 after being advised by [facility] nurse to go to the ED after assessing pt's wound. CT showed necrotic myositis, pt also found to have acute osteomyelitis. Pt found to have symptomatic orthostatic hypotension during therapies 11/6-11/8, and consistently following week.  S/P I&D 11/11 and 11/20. PMH: anemia, COPD, dementia, depression, DM, HLD, HTN, osteoporosis, schizo-affective, SOB, syncope.   OT comments  Patient in bed and agreeable to OT.  Donned ted hose (knee high to L LE and thigh high to R LE), donned abdominal binder after sitting EOB. Patient reports feeling "woozy" throughout session, does not worsen with change in position; otherwise asymptomatic with orthostatic BP but positive reading as below.  She requires increased time and cueing for sequencing and problem solving, pleasantly confused at times and requires redirection.  Completes transfers with min assist using RW, requires min assist for LB dressing and setup for UB dressing.  Will follow acutely, continue to recommend <3hrs/day inpatient setting at dc.   BP supine 149/63 BP EOB 133/66  BP after abd binder donned 143/74  BP standing 132/64  BP standing x 3 minutes but likely inaccurate 123/111 BP sitting after transfer to recliner 113/73  BP reclined 101/61 BP reclined more 113/65      If plan is discharge home, recommend the following:  A little help with walking and/or transfers;Assistance with cooking/housework;Direct supervision/assist for financial management;Direct supervision/assist for medications management;Assist for transportation;Help with stairs or ramp for entrance;A little help with bathing/dressing/bathroom   Equipment Recommendations  Other (comment) (defer)    Recommendations for Other Services      Precautions /  Restrictions Precautions Precautions: Fall Precaution Comments: watch BP, has B thigh high TED hose and abdominal binder Restrictions Weight Bearing Restrictions: No       Mobility Bed Mobility Overal bed mobility: Needs Assistance Bed Mobility: Supine to Sit     Supine to sit: Supervision, HOB elevated     General bed mobility comments: increased time but no assist required    Transfers Overall transfer level: Needs assistance Equipment used: Rolling walker (2 wheels) Transfers: Sit to/from Stand, Bed to chair/wheelchair/BSC Sit to Stand: Min assist     Step pivot transfers: Min assist     General transfer comment: cueing for hand placement and min assist to power up, min assist to step towards R side with increased time and cueing to sequence     Balance Overall balance assessment: Needs assistance Sitting-balance support: Feet supported, Bilateral upper extremity supported Sitting balance-Leahy Scale: Fair Sitting balance - Comments: supervision EOB   Standing balance support: Bilateral upper extremity supported, During functional activity Standing balance-Leahy Scale: Poor Standing balance comment: relies on BUE support                           ADL either performed or assessed with clinical judgement   ADL Overall ADL's : Needs assistance/impaired     Grooming: Set up;Wash/dry face;Sitting           Upper Body Dressing : Set up;Sitting   Lower Body Dressing: Minimal assistance;Sit to/from stand Lower Body Dressing Details (indicate cue type and reason): assist for L sock, min assist in standing Toilet Transfer: Minimal assistance;Rolling walker (2 wheels);Ambulation           Functional mobility during  ADLs: Minimal assistance;Rolling walker (2 wheels)      Extremity/Trunk Assessment              Vision       Perception     Praxis      Cognition Arousal: Alert Behavior During Therapy: Flat affect Overall Cognitive  Status: History of cognitive impairments - at baseline                                 General Comments: pt pleasant but confused. Able to follow simple commands, increased time for problem solving and sequencing at times.  She has decreased awareness to deficits, and perseverating on leaving to SNF today (when case manager came to talk to her about choosing a location).        Exercises      Shoulder Instructions       General Comments pt reports feeling "woozy" throughout session but it does not worsen,  See clinical impression for BP as monitiored thoughout session    Pertinent Vitals/ Pain       Pain Assessment Pain Assessment: Faces Faces Pain Scale: Hurts little more Pain Location: L thigh Pain Descriptors / Indicators: Grimacing, Discomfort, Guarding Pain Intervention(s): Limited activity within patient's tolerance, Monitored during session, Repositioned  Home Living                                          Prior Functioning/Environment              Frequency  Min 1X/week        Progress Toward Goals  OT Goals(current goals can now be found in the care plan section)  Progress towards OT goals: Progressing toward goals  Acute Rehab OT Goals Patient Stated Goal: get to rehab OT Goal Formulation: With patient Time For Goal Achievement: 10/26/23 Potential to Achieve Goals: Good ADL Goals Pt Will Perform Grooming: standing;with modified independence Pt Will Perform Lower Body Dressing: with modified independence;sit to/from stand;with adaptive equipment Pt Will Transfer to Toilet: with modified independence;ambulating;bedside commode Pt Will Perform Toileting - Clothing Manipulation and hygiene: with modified independence;sitting/lateral leans;sit to/from stand Additional ADL Goal #1: Pt will complete pill box test and/or 3 step trail making task with no more than supervision.  Plan      Co-evaluation                  AM-PAC OT "6 Clicks" Daily Activity     Outcome Measure   Help from another person eating meals?: A Little Help from another person taking care of personal grooming?: A Little Help from another person toileting, which includes using toliet, bedpan, or urinal?: A Lot Help from another person bathing (including washing, rinsing, drying)?: A Lot Help from another person to put on and taking off regular upper body clothing?: A Little Help from another person to put on and taking off regular lower body clothing?: A Lot 6 Click Score: 15    End of Session Equipment Utilized During Treatment: Gait belt;Rolling walker (2 wheels)  OT Visit Diagnosis: Other abnormalities of gait and mobility (R26.89);Muscle weakness (generalized) (M62.81);Pain;Other symptoms and signs involving cognitive function Pain - Right/Left: Left Pain - part of body: Leg   Activity Tolerance Patient tolerated treatment well   Patient Left in chair;with call bell/phone within reach;with chair  alarm set   Nurse Communication Mobility status;Other (comment) (BP)        Time: 2841-3244 OT Time Calculation (min): 36 min  Charges: OT General Charges $OT Visit: 1 Visit OT Treatments $Self Care/Home Management : 23-37 mins  Barry Brunner, OT Acute Rehabilitation Services Office [phone]   Elizabeth Schwartz 10/05/2023, 2:01 PM

## 2023-10-05 NOTE — TOC Progression Note (Addendum)
Transition of Care [facility]) - Progression Note    Patient Details  Name: Elizabeth Schwartz MRN: [id number] Date of Birth: 10-21-1946  [facility] CM/SW Contact  Lorri Frederick, LCSW Phone Number: 10/05/2023, 10:54 AM  Clinical Narrative:   CSW informed by MD can move forward to arrange SNF.  CSW spoke with pt, discussed current bed offers.  Pt would like to think about it before choosing.   1430: CSW spoke with pt again regarding SNF.  Pt unable to have rational conversation regarding SNF choice, talking in circles, repeating self.  Does not appear able to consider SNF options and make a decision.  CSW spoke with [facility] APS.  Second APS report made stating pt does not have ability to manage her affairs once done with SNF, unable to safely live alone (first report--see TOC note 11/7)  Per Zollie Beckers, prior report was screened out due to pt being in the hospital with planned DC to SNF, another safe environment.  CSW and Zollie Beckers discussed that pt will again DC to SNF, however, this is short term rehab, maybe 2 weeks, and pt will again be looking at DC home alone.  1610: CSW staffed situation with [facility] supervisor Macario Golds.  She recommends re-involving psych to determine if pt has capacity to choose SNF and sign herself in.  MD/team notified.   Expected Discharge Plan: Skilled Nursing Facility Barriers to Discharge: SNF Pending bed offer  Expected Discharge Plan and Services In-house Referral: Clinical Social Work   Post Acute Care Choice: Skilled Nursing Facility Living arrangements for the past 2 months: Single Family Home                                       Social Determinants of Health (SDOH) Interventions SDOH Screenings   Food Insecurity: No Food Insecurity (09/18/2023)  Housing: Patient Unable To Answer (09/18/2023)  Transportation Needs: No Transportation Needs (09/18/2023)  Utilities: Not At Risk (09/18/2023)  Alcohol Screen: Low  Risk  (03/22/2023)  Depression (PHQ2-9): Low Risk  (07/13/2023)  Financial Resource Strain: Low Risk  (03/22/2023)  Physical Activity: Insufficiently Active (03/22/2023)  Social Connections: Socially Isolated (03/22/2023)  Stress: No Stress Concern Present (03/22/2023)  Tobacco Use: Medium Risk (10/04/2023)    Readmission Risk Interventions     No data to display

## 2023-10-05 NOTE — Anesthesia Postprocedure Evaluation (Signed)
Anesthesia Post Note  Patient: Elizabeth Schwartz  Procedure(s) Performed: left thigh wound evaluation under anesthesia, debridement, removal of Sorbact (Left: Thigh)     Patient location during evaluation: PACU Anesthesia Type: General Level of consciousness: awake and alert Pain management: pain level controlled Vital Signs Assessment: post-procedure vital signs reviewed and stable Respiratory status: spontaneous breathing, nonlabored ventilation, respiratory function stable and patient connected to nasal cannula oxygen Cardiovascular status: blood pressure returned to baseline and stable Postop Assessment: no apparent nausea or vomiting Anesthetic complications: no   No notable events documented.  Last Vitals:  Vitals:   10/05/23 1057 10/05/23 1542  BP: (!) 140/63 (!) 141/63  Pulse: 100 96  Resp:  16  Temp:  36.7 C  SpO2:  97%    Last Pain:  Vitals:   10/05/23 1542  TempSrc: Oral  PainSc:                  Elizabeth Schwartz

## 2023-10-05 NOTE — Assessment & Plan Note (Addendum)
Noted during OT on 11/7. No issues since. Using TED hose and abdominal binder -PT/OT following

## 2023-10-05 NOTE — Progress Notes (Signed)
Physical Therapy Treatment Patient Details Name: Elizabeth Schwartz MRN: [id number] DOB: [birth year] Today's Date: 10/05/2023   History of Present Illness Pt is a 77 y/o F admitted on 09/17/23 after being advised by [facility] nurse to go to the ED after assessing pt's wound. CT showed necrotic myositis, pt also found to have acute osteomyelitis. Pt found to have symptomatic orthostatic hypotension during therapies 11/6-11/8, and consistently following week.  S/P I&D 11/11 and 11/20. PMH: anemia, COPD, dementia, depression, DM, HLD, HTN, osteoporosis, schizo-affective, SOB, syncope.    PT Comments  Pt received in supine, agreeable to therapy session and with good participation and fair tolerance for transfer and short distance gait training at bedside. Session tolerance limited due to pt mostly asymptomatic orthostatic hypotension (no c/o dizziness but pt admits to fatigue and "head feeling buzzy" after prolonged standing and prompting multiple time by PTA for any symptoms). Pt had TED hose and abdominal binder donned throughout session. Pt still with LLE foot drop and would benefit from LLE AFO to reduce her fall risk as pt very high fall risk and tends to trip on L foot each step, MD notified. Pt needing up to minA for bed mobility and transfers and up to modA for gait (especially backward stepping) with RW with dense safety/sequencing cues. Increased time to perform all tasks due to pt's cognitive deficit/slow processing. Patient will benefit from continued inpatient post-acute follow up therapy, <3 hours/day   Vital Signs  Patient Position (if appropriate) Orthostatic Vitals  Orthostatic Lying   BP- Lying 139/52  Pulse- Lying 97  Orthostatic Sitting  BP- Sitting 121/70  Pulse- Sitting 104  Orthostatic Standing at 0 minutes  BP- Standing at 0 minutes (!) 83/50 (stand to sit)  Pulse- Standing at 0 minutes 115   Orthostatic Sitting  BP- Sitting 134/76  Pulse- Sitting 113  Orthostatic Standing at 0  minutes  BP- Standing at 0 minutes 103/44  Pulse- Standing at 0 minutes 131     If plan is discharge home, recommend the following: A little help with bathing/dressing/bathroom;Assistance with cooking/housework;Supervision due to cognitive status;Direct supervision/assist for financial management;Assist for transportation;Help with stairs or ramp for entrance;Direct supervision/assist for medications management;A lot of help with walking and/or transfers   Can travel by private vehicle     Yes  Equipment Recommendations  Rolling walker (2 wheels);BSC/3in1;Other (comment) (may need WC pending progress, orthostatic frequently)    Recommendations for Other Services       Precautions / Restrictions Precautions Precautions: Fall Precaution Comments: watch BP, has B thigh high TED hose and abdominal binder Restrictions Weight Bearing Restrictions: No     Mobility  Bed Mobility Overal bed mobility: Needs Assistance Bed Mobility: Supine to Sit     Supine to sit: Min assist     General bed mobility comments: minA from flat bed with no rail use    Transfers Overall transfer level: Needs assistance Equipment used: Rolling walker (2 wheels) Transfers: Sit to/from Stand, Bed to chair/wheelchair/BSC Sit to Stand: Min assist   Step pivot transfers: Min assist       General transfer comment: Cueing for hand placement and min assist to power up, min assist to step towards R side with increased time and cueing to sequence, assist with RW.    Ambulation/Gait Ambulation/Gait assistance: Min assist Gait Distance (Feet): 12 Feet (48ft forward, 6 ft backward) Assistive device: Rolling walker (2 wheels) Gait Pattern/deviations: Step-to pattern, Trunk flexed, Decreased dorsiflexion - left, Decreased weight shift to left,  Decreased step length - left, Antalgic Gait velocity: decreased Gait velocity interpretation: <1.31 ft/sec, indicative of household ambulator   General Gait Details:  Distance limited due to pt c/o fatigue and apparent LLE pain with weight bearing, and likely orthostatic symptoms although pt has difficulty explaining her symptoms in standing due to disorganized thought process/cognitive impairment.   Stairs             Wheelchair Mobility     Tilt Bed    Modified Rankin (Stroke Patients Only)       Balance Overall balance assessment: Needs assistance Sitting-balance support: Feet supported, Bilateral upper extremity supported Sitting balance-Leahy Scale: Fair Sitting balance - Comments: supervision EOB   Standing balance support: Bilateral upper extremity supported, During functional activity Standing balance-Leahy Scale: Poor Standing balance comment: relies on BUE support                            Cognition Arousal: Alert Behavior During Therapy: Flat affect Overall Cognitive Status: History of cognitive impairments - at baseline                                 General Comments: pt pleasant but confused. Able to follow simple commands, increased time for problem solving and sequencing at times.  She has decreased awareness to deficits, and perseverating on leaving to SNF today (when case manager came to talk to her about choosing a location).        Exercises Other Exercises Other Exercises: AP's in long sitting (LLE no significant AROM, reinforced for pt to attempt as able, better ROM on RLE), seated BLE AROM: LAQ as able  x5-10 reps ea    General Comments General comments (skin integrity, edema, etc.): No initial c/o dizziness but pt with more disorganized speech/thought process standing and c/o fatigue, when BP noted to have drop >20 points. Pt had abdominal binder and TED hose donned in all postures. MD notified pt will need LLE AFO due to continued L foot drop which greatly increases her fall risk.      Pertinent Vitals/Pain Pain Assessment Pain Assessment: Faces Faces Pain Scale: Hurts little  more Pain Location: L thigh Pain Descriptors / Indicators: Grimacing, Discomfort, Guarding Pain Intervention(s): Monitored during session, Repositioned, Limited activity within patient's tolerance     PT Goals (current goals can now be found in the care plan section) Acute Rehab PT Goals Patient Stated Goal: none stated PT Goal Formulation: With patient Time For Goal Achievement: 10/13/23 (updated 11/15 by PT [place] [facility] but change did not carry over in note) Progress towards PT goals: Progressing toward goals    Frequency    Min 1X/week      PT Plan         AM-PAC PT "6 Clicks" Mobility   Outcome Measure  Help needed turning from your back to your side while in a flat bed without using bedrails?: A Little Help needed moving from lying on your back to sitting on the side of a flat bed without using bedrails?: A Little Help needed moving to and from a bed to a chair (including a wheelchair)?: A Lot Help needed standing up from a chair using your arms (e.g., wheelchair or bedside chair)?: A Lot Help needed to walk in hospital room?: Total (<78ft) Help needed climbing 3-5 steps with a railing? : Total 6 Click Score: 12  End of Session Equipment Utilized During Treatment: Gait belt Activity Tolerance: Patient limited by fatigue;Treatment limited secondary to medical complications (Comment);Other (comment) (orthostatic hypotension) Patient left: in chair;with call bell/phone within reach;with chair alarm set;Other (comment) (pt set up to eat her dinner) Nurse Communication: Mobility status;Other (comment) (remove abdominal binder and gait belt before getting her back to bed from chair; pt still orthostatic with standing) PT Visit Diagnosis: Muscle weakness (generalized) (M62.81);Pain;Other abnormalities of gait and mobility (R26.89);Difficulty in walking, not elsewhere classified (R26.2) Pain - Right/Left: Left Pain - part of body: Leg;Shoulder     Time: 4098-1191 PT Time  Calculation (min) (ACUTE ONLY): 38 min  Charges:    $Gait Training: 8-22 mins $Therapeutic Activity: 23-37 mins PT General Charges $$ ACUTE PT VISIT: 1 Visit                     Nettie Wyffels P., PTA Acute Rehabilitation Services Secure Chat Preferred 9a-5:30pm Office: [phone]    Dorathy Kinsman [facility] 10/05/2023, 6:24 PM

## 2023-10-05 NOTE — Assessment & Plan Note (Addendum)
 Early acute osteomyelitis found on CT. Treatment plan as above.

## 2023-10-05 NOTE — Plan of Care (Signed)
FMTS Interim Progress Note Elizabeth Schwartz is a 77 y.o. female with left thigh wound status post examination and wound and debridement today.   S: Patient is sleeping and does not provide any history.  No concerns per nursing.  O: BP (!) 145/64 (BP Location: Left Arm)   Pulse 90   Temp 98.4 F (36.9 C) (Oral)   Resp 16   Ht 5\' 5"  (1.651 m)   Wt 74.8 kg   SpO2 97%   BMI 27.46 kg/m    Physical Exam Constitutional:      Comments: Lying comfortably in bed, sleeping  HENT:     Head: Normocephalic and atraumatic.  Cardiovascular:     Rate and Rhythm: Normal rate and regular rhythm.     Heart sounds: Normal heart sounds.  Pulmonary:     Effort: Pulmonary effort is normal.     Breath sounds: Normal breath sounds.     Comments: Scattered rhonchi Skin:    General: Skin is warm and dry.    A/P: No concern for surgical complications at this time.  No recommendations from surgery other than follow-up with her office in 1 week if she is discharged to SNF or surgeon will follow-up inpatient in 7 days if not discharged. Continue daily wound dressings Continue ID recommendations Continue surgery recommendations Continue scheduled Tylenol for pain, reassess patient's pain in morning Restart VTE prophylaxis per day team  Meryl Dare, MD 10/05/2023, 1:18 AM PGY-1, [facility] Family Medicine Service pager [phone]

## 2023-10-05 NOTE — Progress Notes (Signed)
[facility]  Date of Admission:  09/17/2023   Total days of inpatient antibiotics 17  Principal Problem:   Pressure injury of left thigh, stage 4 (HCC) Active Problems:   Normocytic anemia   Long term current use of clozapine   Myositis of left thigh   Osteomyelitis of left femur (HCC)   Necrotizing myositis   Diabetes mellitus without complication (HCC)   Dementia (HCC)   Goals of care, counseling/discussion   Skin ulcer of left great toe (HCC)   Left leg swelling   Type 2 diabetes mellitus without complication (HCC)   Anemia          Assessment: 77 year old female with diabetes, dementia presented to ED with worsening wound to the left inner thigh with purulent drainage admitted with: #Necrotizing myositis of medial left thigh/deep tissue wound #Possible acute early osteomyelitis of mid left femur - She was admitted for fall 9/25 - 10/4, neighbors found her pinned to the ground with stove on top of her.  She had compressive injury to the left thigh with rhabdomyolysis. - This admission she underwent debridement of wound on 11/11 with cultures growing Pseudomonas, E faecalis and Bacteroides she was transition to meropenem. - Taken to the OR on 11/20 for debridement.  Per note there was necrotic tissue next to femur and medial aspect of wound.  Recommendations: -Continue merrem -Follow OR cx from 11/20   Microbiology:   Antibiotics: Meropenem Cultures: Blood 11/3 NG   Other 11/11 PsA, E faecalis, bacteroids distanis 11/20 OR cx ng  SUBJECTIVE: Resting in bed.  Interval: Afebrile overnight..  Review of Systems: Review of Systems  All other systems reviewed and are negative.    Scheduled Meds:  acetaminophen  650 mg Oral Q6H   Or   acetaminophen  650 mg Rectal Q6H   ascorbic acid  250 mg Oral BID   clozapine  200 mg Oral QHS   donepezil  10 mg Oral QHS   enoxaparin (LOVENOX) injection  40 mg Subcutaneous Q24H    memantine  5 mg Oral BID   mometasone-formoterol  2 puff Inhalation BID   multivitamin with minerals  1 tablet Oral Daily   nutrition supplement (JUVEN)  1 packet Oral BID BM   sodium chloride flush  10 mL Intravenous Q12H   white petrolatum   Topical BID   Continuous Infusions:  meropenem (MERREM) IV 1 g (10/05/23 1325)   PRN Meds:.albuterol Allergies  Allergen Reactions   Crestor [Rosuvastatin] Other (See Comments)    Muscle aches.     OBJECTIVE: Vitals:   10/05/23 1057 10/05/23 1542 10/05/23 1925 10/05/23 2005  BP: (!) 140/63 (!) 141/63  126/61  Pulse: 100 96  100  Resp:  16  17  Temp:  98.1 F (36.7 C)    TempSrc:  Oral    SpO2:  97% 97% 96%  Weight:      Height:       Body mass index is 27.46 kg/m.  Physical Exam Constitutional:      Appearance: Normal appearance.  HENT:     Head: Normocephalic and atraumatic.     Right Ear: Tympanic membrane normal.     Left Ear: Tympanic membrane normal.     Nose: Nose normal.     Mouth/Throat:     Mouth: Mucous membranes are moist.  Eyes:     Extraocular Movements: Extraocular movements intact.     Conjunctiva/sclera: Conjunctivae normal.  Pupils: Pupils are equal, round, and reactive to light.  Cardiovascular:     Rate and Rhythm: Normal rate and regular rhythm.     Heart sounds: No murmur heard.    No friction rub. No gallop.  Pulmonary:     Effort: Pulmonary effort is normal.     Breath sounds: Normal breath sounds.  Abdominal:     General: Abdomen is flat.     Palpations: Abdomen is soft.  Musculoskeletal:     Comments: Left leg wound  Skin:    General: Skin is warm and dry.  Neurological:     General: No focal deficit present.     Mental Status: She is alert and oriented to person, place, and time.  Psychiatric:        Mood and Affect: Mood normal.       Lab Results Lab Results  Component Value Date   WBC 4.1 10/05/2023   HGB 8.4 (L) 10/05/2023   HCT 27.3 (L) 10/05/2023   MCV 89.5  10/05/2023   PLT 341 10/05/2023    Lab Results  Component Value Date   CREATININE 0.80 10/02/2023   BUN 15 10/02/2023   NA 143 10/02/2023   K 3.7 10/02/2023   CL 112 (H) 10/02/2023   CO2 24 10/02/2023    Lab Results  Component Value Date   ALT 15 09/20/2023   AST 17 09/20/2023   ALKPHOS 39 09/20/2023   BILITOT 0.4 09/20/2023        Danelle Earthly, MD [facility] 10/05/2023, 9:04 PM I have personally spent 52 minutes involved in face-to-face and non-face-to-face activities for this patient on the day of the visit. Professional time spent includes the following activities: Preparing to see the patient (review of tests), Obtaining and/or reviewing separately obtained history (admission/discharge record), Performing a medically appropriate examination and/or evaluation , Ordering medications/tests/procedures, referring and communicating with other health care professionals, Documenting clinical information in the EMR, Independently interpreting results (not separately reported), Communicating results to the patient/family/caregiver, Counseling and educating the patient/family/caregiver and Care coordination (not separately reported).

## 2023-10-05 NOTE — Progress Notes (Signed)
1 Day Post-Op  Subjective: Patient is a pleasant 77 year old female s/p debridement left thigh wound performed 10/04/2023 by Dr. Ladona Ridgel.  In short, patient was admitted to the hospital 08/09/2023 with AKI, rhabdomyolysis, and left thigh compression injury reportedly due to an oven falling on her.  She then developed soft tissue wound at the site of her trauma with imaging concerning for necrotizing myositis and acute osteomyelitis.  Our service was then consulted by orthopedics and she was taken to the operating room by Dr. Ladona Ridgel 09/25/2023 for debridement of necrotic eschar with placement of myriad wound matrix.  Today, patient is resting comfortably in her recliner chair.  She does not recognize me, but admittedly has met many people during her hospitalization.  Informed her that surgery went well yesterday and she eagerly asked how soon she could return.  Informed her that we will perform dressing changes for the near future and allow for the wound to heal from the bottom up.  She states that it is sore, but no severe pain.  Otherwise doing well from a postoperative standpoint.  Objective: Vital signs in last 24 hours: Temp:  [97.8 F (36.6 C)-98.4 F (36.9 C)] 98.1 F (36.7 C) (11/21 1000) Pulse Rate:  [81-102] 100 (11/21 1057) Resp:  [11-17] 17 (11/21 1000) BP: (125-145)/(51-120) 140/63 (11/21 1057) SpO2:  [90 %-98 %] 97 % (11/21 1000) Last BM Date : 09/30/23  Intake/Output from previous day: 11/20 0701 - 11/21 0700 In: 500 [I.V.:500] Out: 2710 [Urine:2700; Blood:10] Intake/Output this shift: No intake/output data recorded.  General appearance: alert, cooperative, and sitting comfortably Extremities: Dressings and overlying Kerlix/Ace wrap in place.  Compression stockings distally.  No asymmetric leg swelling or edema.  Good capillary refill distally. Neurologic: Grossly normal.  She knows that it is month of November and that she is at [facility], but she seemed confused  about the timeline of events involving her injury.  Lab Results:     Latest Ref Rng & Units 10/05/2023    6:30 AM 10/04/2023    7:33 AM 10/02/2023    8:05 AM  CBC  WBC 4.0 - 10.5 K/uL 4.1  5.4  5.0   Hemoglobin 12.0 - 15.0 g/dL 8.4  7.6  7.9   Hematocrit 36.0 - 46.0 % 27.3  25.2  25.8   Platelets 150 - 400 K/uL 341  307  326     BMET No results for input(s): "NA", "K", "CL", "CO2", "GLUCOSE", "BUN", "CREATININE", "CALCIUM" in the last 72 hours. PT/INR No results for input(s): "LABPROT", "INR" in the last 72 hours. ABG No results for input(s): "PHART", "HCO3" in the last 72 hours.  Invalid input(s): "PCO2", "PO2"  Studies/Results: No results found.  Anti-infectives: Anti-infectives (From admission, onward)    Start     Dose/Rate Route Frequency Ordered Stop   10/01/23 2200  meropenem (MERREM) 1 g in sodium chloride 0.9 % 100 mL IVPB        1 g 200 mL/hr over 30 Minutes Intravenous Every 8 hours 10/01/23 1459     09/28/23 1300  meropenem (MERREM) 1 g in sodium chloride 0.9 % 100 mL IVPB  Status:  Discontinued        1 g 200 mL/hr over 30 Minutes Intravenous Every 12 hours 09/28/23 0956 10/01/23 1459   09/18/23 1600  vancomycin (VANCOCIN) IVPB 1000 mg/200 mL premix  Status:  Discontinued        1,000 mg 200 mL/hr over 60 Minutes Intravenous Every 24 hours 09/17/23  1904 09/18/23 1402   09/18/23 1500  linezolid (ZYVOX) tablet 600 mg  Status:  Discontinued        600 mg Oral Every 12 hours 09/18/23 1402 09/29/23 1456   09/17/23 2200  piperacillin-tazobactam (ZOSYN) IVPB 3.375 g  Status:  Discontinued        3.375 g 12.5 mL/hr over 240 Minutes Intravenous Every 8 hours 09/17/23 1904 09/28/23 0956   09/17/23 1545  piperacillin-tazobactam (ZOSYN) IVPB 3.375 g        3.375 g 100 mL/hr over 30 Minutes Intravenous  Once 09/17/23 1531 09/17/23 1701   09/17/23 1300  vancomycin (VANCOREADY) IVPB 1500 mg/300 mL        1,500 mg 150 mL/hr over 120 Minutes Intravenous  Once 09/17/23  1253 09/17/23 1615       Assessment/Plan: s/p Procedure(s): left thigh wound evaluation under anesthesia, debridement, removal of Sorbact    LOS: 18 days   Large soft tissue defect left medial thigh s/p multiple debridements:  Patient seems to be doing well from postoperative standpoint.  Mild soreness as anticipated.  Wound appeared much cleaner yesterday in the operating room compared to the previous debridement.  However, there was some unhealthy tissue that was debrided as well as concern for ongoing Pseudomonas infection for which decision was made not to place an additional myriad tissue matrix.  Instead, we will plan for continued dressing changes and wait for further granulation before returning to the operating room for consideration of tissue matrix placement and/or partial closure.  Will continue to follow patient.  Disposition per primary team.  If she does go home to a SNF, she can follow-up with our office for ongoing wound management.  Wound care as described below:  "Place silver calcium alginate at the base of the wound followed by ABD pad, Kerlix, and Ace wrap. Change daily.  If silver calcium alginate is not available, please place adaptic dressing followed by KY kelly, 4 x 4 gauze soaked in Vashe wound cleanser, ABD pad, and then Kerlix and Ace wrap. Change daily.  Please message plastic surgery team if there are any clarifying questions. Thank you"   Evelena Leyden, PA-C 10/05/2023

## 2023-10-05 NOTE — Assessment & Plan Note (Addendum)
2/2 crush injury now s/p I&D on 11/11 and additional debridement on 11/20.  She will follow-up with plastic surgeon in 1 week whether this is outpatient or in the hospital. -Plastic surgery following -ID following -Continue Meropenem -Pain management: Scheduled tylenol 650mg  q6h

## 2023-10-05 NOTE — Progress Notes (Signed)
Daily Progress Note Intern Pager: [phone]  Patient name: Elizabeth Schwartz Medical record number: [id number] Date of birth: 11-15-1945 Age: 77 y.o. Gender: female  Primary Care Provider: Storm Frisk, MD Consultants: plastic surgery, ID Code Status: Full code  Pt Overview and Major Events to Date:  11/3: Admitted to [facility] 11/11: I&D L thigh 10/04/23: left thigh wound evaluation under anesthesia w/ debridement  Assessment and Plan:  77 year old female with PMHx HLD, schizophrenia, dementia, COPD, osteoporosis and T2DM who was admitted for left thigh wound after a crush injury and found to have imaging concerning for necrotizing myositis and early osteomyelitis.  Now pending SNF placement, will reconsult ID regarding antibiotic duration Assessment & Plan Necrotizing myositis 2/2 crush injury now s/p I&D on 11/11 and additional debridement on 11/20.  She will follow-up with plastic surgeon in 1 week whether this is outpatient or in the hospital. -Plastic surgery following -ID following -Continue Meropenem -Pain management: Scheduled tylenol 650mg  q6h Osteomyelitis of left femur (HCC) Early acute osteomyelitis found on CT. Treatment plan as above. Skin ulcer of left great toe (HCC) Noted on admission, still appears stable -Vaseline to feet BID Left leg swelling L foot and lower leg with ongoing edema present since admission noted to have warmth, erythema, and tenderness on exam 11/14. Low concern for infection as pt already on broad spectrum ABX. Also possible that pt has DVT, though less likely given she has been on daily lovenox. Most likely that this is sequalae of L thigh injury and surgery. -Consider DVT doppler study LLE if symptoms worsen Orthostatic hypotension (Resolved: 10/05/2023) Noted during OT on 11/7. No issues since. Using TED hose and abdominal binder -PT/OT following  Chronic and Stable Problems:  T2DM: holding home metforming 500mg  BID, CBGs have been  stable HLD: Have not started home Zetia 10 mg daily, fenofibrate 160 mg daily (unclear if taking) Schizophrenia: Continue home clozapine 200 mg at bedtime  COPD: Dulera 2puff BID, albuterol as needed Osteoporosis: Have not ordered alendronate 70 mg once a week on Wednesdays per chart review Dementia: Memantine 5 mg twice daily, Aricept 10 mg at bedtime Normocytic anemia: Hgb stable between 7-8 throughout admission   FEN/GI: regular PPx: lovenox Dispo:SNF Barriers include placement.   Subjective:  No acute events overnight.  Denies pain from her surgery.  No concerns at this time.  Objective: Temp:  [97.5 F (36.4 C)-98.4 F (36.9 C)] 98.1 F (36.7 C) (11/21 1000) Pulse Rate:  [81-102] 100 (11/21 1057) Resp:  [11-17] 17 (11/21 1000) BP: (125-145)/(51-120) 140/63 (11/21 1057) SpO2:  [90 %-98 %] 97 % (11/21 1000) Physical Exam: General: Well-appearing, no acute distress, eating breakfast Cardiovascular: Well-perfused Respiratory: No increased work of breathing on room air Extremities: Left lower extremity dressing in place from surgery, clean dry and intact.  No calf acute swelling, tenderness, warmth or redness.   Laboratory: Most recent CBC Lab Results  Component Value Date   WBC 4.1 10/05/2023   HGB 8.4 (L) 10/05/2023   HCT 27.3 (L) 10/05/2023   MCV 89.5 10/05/2023   PLT 341 10/05/2023   Most recent BMP    Latest Ref Rng & Units 10/02/2023    8:05 AM  BMP  Glucose 70 - 99 mg/dL 951   BUN 8 - 23 mg/dL 15   Creatinine 8.84 - 1.00 mg/dL 1.66   Sodium 063 - 016 mmol/L 143   Potassium 3.5 - 5.1 mmol/L 3.7   Chloride 98 - 111 mmol/L 112  CO2 22 - 32 mmol/L 24   Calcium 8.9 - 10.3 mg/dL 9.4     Taheerah Guldin, DO 10/05/2023, 12:22 PM  PGY-1, [facility] Family Medicine FPTS Intern pager: [phone], text pages welcome Secure chat group [facility] Teaching Service

## 2023-10-06 DIAGNOSIS — L89224 Pressure ulcer of left hip, stage 4: Secondary | ICD-10-CM | POA: Diagnosis not present

## 2023-10-06 LAB — CBC
HCT: 25.8 % — ABNORMAL LOW (ref 36.0–46.0)
Hemoglobin: 8 g/dL — ABNORMAL LOW (ref 12.0–15.0)
MCH: 28.4 pg (ref 26.0–34.0)
MCHC: 31 g/dL (ref 30.0–36.0)
MCV: 91.5 fL (ref 80.0–100.0)
Platelets: 365 10*3/uL (ref 150–400)
RBC: 2.82 MIL/uL — ABNORMAL LOW (ref 3.87–5.11)
RDW: 20.1 % — ABNORMAL HIGH (ref 11.5–15.5)
WBC: 5.6 10*3/uL (ref 4.0–10.5)
nRBC: 0 % (ref 0.0–0.2)

## 2023-10-06 NOTE — Progress Notes (Signed)
Daily Progress Note Intern Pager: [phone]  Patient name: Elizabeth Schwartz Medical record number: [id number] Date of birth: August 05, 1946 Age: 77 y.o. Gender: female  Primary Care Provider: Storm Frisk, MD Consultants: plastic surgery, ID Code Status: Full  Pt Overview and Major Events to Date:  11/3: Admitted to [facility]MTS 11/11: I&D L thigh 11/20: left thigh wound evaluation under anesthesia w/ debridement  Assessment and Plan:  77 year old female with PMHx HLD, schizophrenia, dementia, COPD, osteoporosis and T2DM who was admitted for left thigh wound after a crush injury and found to have imaging concerning for necrotizing myositis and early osteomyelitis.  Now pending SNF placement, and legal guardian. APS accepted pt's case.  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  She will follow-up with plastic surgeon in 1 week whether this is outpatient or in the hospital. -Plastic surgery following -ID following -Continue Meropenem -Pain management: Scheduled tylenol 650mg  q6h Osteomyelitis of left femur (HCC) Early acute osteomyelitis found on CT. Treatment plan as above. Skin ulcer of left great toe (HCC) Noted on admission, still appears stable -Vaseline to feet BID Left leg swelling L foot and lower leg with ongoing edema present since admission noted to have warmth, erythema, and tenderness on exam 11/14. Low concern for infection as pt already on broad spectrum ABX. Also possible that pt has DVT, though less likely given she has been on daily lovenox. Most likely that this is sequalae of L thigh injury and surgery. -Consider DVT doppler study LLE if symptoms worsen   Chronic and Stable Problems:  T2DM: holding home metforming 500mg  BID, CBGs have been stable HLD: Have not started home Zetia 10 mg daily, fenofibrate 160 mg daily (unclear if taking) Schizophrenia: Continue home clozapine 200 mg at  bedtime  COPD: Dulera 2puff BID, albuterol as needed Osteoporosis: Have not ordered alendronate 70 mg once a week on Wednesdays per chart review Dementia: Memantine 5 mg twice daily, Aricept 10 mg at bedtime Normocytic anemia: Hgb stable between 7-8 throughout admission   FEN/GI: regular PPx: lovenox Dispo:SNF Barriers include placement.   Subjective:  No events overnight.  Patient unable to really explain to me why she needs a SNF, or which SNF she wants to go to.  She does tend to talk in circles regarding SNF placement.  States her only family is her niece Bonita Quin who is also her power of attorney and will get her a state when she dies.  Does not know her phone number but states she [place].  Objective: Temp:  [98.1 F (36.7 C)-98.3 F (36.8 C)] 98.3 F (36.8 C) (11/22 0712) Pulse Rate:  [87-102] 88 (11/22 0712) Resp:  [16-18] 16 (11/22 0712) BP: (122-141)/(57-120) 122/57 (11/22 0712) SpO2:  [92 %-98 %] 92 % (11/22 0102) Physical Exam: General: No acute distress, resting comfortably Respiratory: No increased work of breathing on room air Extremities: Left lower extremity wrapped, no tenderness to palpation.  Right lower extremity unchanged  Laboratory: Most recent CBC Lab Results  Component Value Date   WBC 4.1 10/05/2023   HGB 8.4 (L) 10/05/2023   HCT 27.3 (L) 10/05/2023   MCV 89.5 10/05/2023   PLT 341 10/05/2023   Most recent BMP    Latest Ref Rng & Units 10/02/2023    8:05 AM  BMP  Glucose 70 - 99 mg/dL 725   BUN 8 - 23 mg/dL 15   Creatinine 3.66 - 1.00  mg/dL 4.09   Sodium 811 - 914 mmol/L 143   Potassium 3.5 - 5.1 mmol/L 3.7   Chloride 98 - 111 mmol/L 112   CO2 22 - 32 mmol/L 24   Calcium 8.9 - 10.3 mg/dL 9.4    Tallis Soledad, DO 10/06/2023, 7:22 AM  PGY-1, [facility] Health Family Medicine FPTS Intern pager: [phone], text pages welcome Secure chat group [facility] Teaching Service

## 2023-10-06 NOTE — Assessment & Plan Note (Signed)
Early acute osteomyelitis found on CT. Treatment plan as above.

## 2023-10-06 NOTE — Care Management Important Message (Signed)
Important Message  Patient Details  Name: Elizabeth Schwartz MRN: [id number] Date of Birth: April 30, 1946   Important Message Given:  Yes - Medicare IM     Sherilyn Banker 10/06/2023, 9:23 AM

## 2023-10-06 NOTE — Assessment & Plan Note (Signed)
2/2 crush injury now s/p I&D on 11/11 and additional debridement on 11/20.  She will follow-up with plastic surgeon in 1 week whether this is outpatient or in the hospital. -Plastic surgery following -ID following -Continue Meropenem -Pain management: Scheduled tylenol 650mg  q6h

## 2023-10-06 NOTE — Assessment & Plan Note (Signed)
Noted on admission, still appears stable -Vaseline to feet BID

## 2023-10-06 NOTE — Assessment & Plan Note (Signed)
L foot and lower leg with ongoing edema present since admission noted to have warmth, erythema, and tenderness on exam 11/14. Low concern for infection as pt already on broad spectrum ABX. Also possible that pt has DVT, though less likely given she has been on daily lovenox. Most likely that this is sequalae of L thigh injury and surgery. -Consider DVT doppler study LLE if symptoms worsen

## 2023-10-06 NOTE — TOC Progression Note (Addendum)
Transition of Care [facility]) - Progression Note    Patient Details  Name: Elizabeth Schwartz MRN: [id number] Date of Birth: 06/14/1946  [facility]) CM/SW Contact  Lorri Frederick, LCSW Phone Number: 10/06/2023, 8:40 AM  Clinical Narrative:   Chat from MD team, pt does have capacity to make SNF choice, they will document.  Will move forward with pt choice of [facility].  Message from [employer].  The APS report has been accepted, will make contact within 72 hours.    1000: CSW spoke with Tammy Blakely/[facility].  She will review the pt and get back to CSW once she determines where pt is with her SNF days.  1340: TC Tammy Blakely/[facility].  They will need APS to establish a protective order and to sign the pt into their facility before they can admit her. Two reasons for this: they do not think pt can actually appropriately sign herself in and they will also need the assistance of APS in order to move forward with medicaid application and securing needed records, since there is no family that is assisting with this.  Expected Discharge Plan: Skilled Nursing Facility Barriers to Discharge: SNF Pending bed offer  Expected Discharge Plan and Services In-house Referral: Clinical Social Work   Post Acute Care Choice: Skilled Nursing Facility Living arrangements for the past 2 months: Single Family Home                                       Social Determinants of Health (SDOH) Interventions SDOH Screenings   Food Insecurity: No Food Insecurity (09/18/2023)  Housing: Patient Unable To Answer (09/18/2023)  Transportation Needs: No Transportation Needs (09/18/2023)  Utilities: Not At Risk (09/18/2023)  Alcohol Screen: Low Risk  (03/22/2023)  Depression (PHQ2-9): Low Risk  (07/13/2023)  Financial Resource Strain: Low Risk  (03/22/2023)  Physical Activity: Insufficiently Active (03/22/2023)  Social Connections: Socially Isolated (03/22/2023)  Stress: No Stress Concern  Present (03/22/2023)  Tobacco Use: Medium Risk (10/04/2023)    Readmission Risk Interventions     No data to display

## 2023-10-07 DIAGNOSIS — L89224 Pressure ulcer of left hip, stage 4: Secondary | ICD-10-CM | POA: Diagnosis not present

## 2023-10-07 NOTE — Progress Notes (Signed)
Orthopedic Tech Progress Note Patient Details:  Elizabeth Schwartz May 25, 1946 [id number]  Routine order for an AFO called into [employer] @ 1537.  Patient ID: Elizabeth Schwartz, female   DOB: [birth year], 77 y.o.   MRN: [id number]  Docia Furl 10/07/2023, 3:41 PM

## 2023-10-07 NOTE — Assessment & Plan Note (Addendum)
Ddx cellulitis, DVT, sequalae of L thigh injury / surgery. No changes today.  -Consider DVT doppler study LLE if symptoms worsen

## 2023-10-07 NOTE — Assessment & Plan Note (Addendum)
2/2 crush injury now s/p I&D on 11/11 and additional debridement on 11/20.  Wound visualized today and looks okay with some purulent discharge. Pain is managed.  -Plastic surgery following, will see her around 11/27 -ID following -Continue Meropenem -Pain management: Scheduled tylenol 650mg  q6h

## 2023-10-07 NOTE — Progress Notes (Signed)
Daily Progress Note Intern Pager: [phone]  Patient name: Elizabeth Schwartz Medical record number: [id number] Date of birth: October 10, 1946 Age: 77 y.o. Gender: female  Primary Care Provider: Storm Frisk, MD Consultants: Plastic surgery, ID Code Status: Full  Pt Overview and Major Events to Date:  11/3: Admitted to FMTS 11/11: I&D L thigh 11/20: left thigh wound evaluation under anesthesia w/ debridement  Assessment and Plan:  77 year old female with PMHx HLD, schizophrenia, dementia, COPD, osteoporosis and T2DM who was admitted for left thigh wound after a crush injury and found to have imaging concerning for necrotizing myositis and early osteomyelitis.  Now pending SNF placement, and legal guardian. Update yesterday no significant advances.  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  Wound visualized today and looks okay with some purulent discharge. Pain is managed.  -Plastic surgery following, will see her around 11/27 -ID following -Continue Meropenem -Pain management: Scheduled tylenol 650mg  q6h Osteomyelitis of left femur (HCC) Early acute osteomyelitis found on CT. Treatment plan as above. Skin ulcer of left great toe (HCC) Noted on admission, continues to appear stable -Vaseline to feet BID Left leg swelling Ddx cellulitis, DVT, sequalae of L thigh injury / surgery. No changes today.  -Consider DVT doppler study LLE if symptoms worsen   Chronic and Stable Issues: T2DM: holding home metforming 500mg  BID, CBGs have been stable HLD: Have not started home Zetia 10 mg daily, fenofibrate 160 mg daily (unclear if taking) Schizophrenia: Continue home clozapine 200 mg at bedtime  COPD: Dulera 2puff BID, albuterol as needed Osteoporosis: Have not ordered alendronate 70 mg once a week on Wednesdays per chart review Dementia: Memantine 5 mg twice daily, Aricept 10 mg at bedtime Normocytic anemia:  Hgb stable between 7-8 throughout admission  FEN/GI: Regular PPx: Lovenox Dispo:SNF  pending placement .  Medically stable for discharge.  Subjective:  Patient reports no concerns today. Reports she is happy to get her tylenol. Does not report signirficant breakthrough pain from current regimen.   Objective: Temp:  [98.3 F (36.8 C)-98.8 F (37.1 C)] 98.6 F (37 C) (11/23 0322) Pulse Rate:  [81-90] 81 (11/23 0322) Resp:  [15-16] 16 (11/23 0322) BP: (119-134)/(41-60) 134/60 (11/23 0322) SpO2:  [92 %-97 %] 95 % (11/23 0322) Physical Exam: General: calm and cooperative. Engaging in conversation. Laying in bed. Comfortable and not in acute distress. Does not appear in significant pain during dressing change.  Cardiovascular: normal rate and rhythm.  Respiratory: normal effort.  Abdomen: no tenderness to palpation.  Extremities: LLE thigh wound with purulent drainage. Photo below of today and comparing 3 days ago.  11/23 11/20  Laboratory: Most recent CBC Lab Results  Component Value Date   WBC 5.6 10/06/2023   HGB 8.0 (L) 10/06/2023   HCT 25.8 (L) 10/06/2023   MCV 91.5 10/06/2023   PLT 365 10/06/2023   Most recent BMP    Latest Ref Rng & Units 10/02/2023    8:05 AM  BMP  Glucose 70 - 99 mg/dL 147   BUN 8 - 23 mg/dL 15   Creatinine 8.29 - 1.00 mg/dL 5.62   Sodium 130 - 865 mmol/L 143   Potassium 3.5 - 5.1 mmol/L 3.7   Chloride 98 - 111 mmol/L 112   CO2 22 - 32 mmol/L 24   Calcium 8.9 - 10.3 mg/dL 9.4     No labs or imaging today   Meryl Dare, MD 10/07/2023, 6:48  AM  PGY-1, [facility] FPTS Intern pager: [phone], text pages welcome Secure chat group [facility] Teaching Service

## 2023-10-07 NOTE — Assessment & Plan Note (Addendum)
 Early acute osteomyelitis found on CT. Treatment plan as above.

## 2023-10-07 NOTE — Assessment & Plan Note (Addendum)
Noted on admission, continues to appear stable -Vaseline to feet BID

## 2023-10-08 DIAGNOSIS — L89224 Pressure ulcer of left hip, stage 4: Secondary | ICD-10-CM | POA: Diagnosis not present

## 2023-10-08 LAB — CK: Total CK: 38 U/L (ref 38–234)

## 2023-10-08 MED ORDER — DAPTOMYCIN-SODIUM CHLORIDE 700-0.9 MG/100ML-% IV SOLN
700.0000 mg | Freq: Every day | INTRAVENOUS | Status: DC
  Administered 2023-10-09 – 2023-11-01 (×23): 700 mg via INTRAVENOUS
  Filled 2023-10-08 (×26): qty 100

## 2023-10-08 NOTE — Assessment & Plan Note (Addendum)
Persistent.  Ddx cellulitis, DVT, sequalae of L thigh injury / surgery. No changes today.  -Consider DVT doppler study LLE if symptoms worsen

## 2023-10-08 NOTE — Assessment & Plan Note (Addendum)
2/2 crush injury now s/p I&D on 11/11 and additional debridement on 11/20. Pain is managed.  Wound culture growing abundant Pseudomonas with some level of resistance to everything but gentamicin and imipenem. -Plastic surgery following, will see her around 11/27 -ID following, appreciate recommendations -Continue Meropenem -Pain management: Scheduled tylenol 650mg  q6h

## 2023-10-08 NOTE — Assessment & Plan Note (Signed)
Noted on admission, continues to appear stable. -Vaseline to feet BID

## 2023-10-08 NOTE — Plan of Care (Signed)
  Problem: Education: Goal: Ability to describe self-care measures that may prevent or decrease complications (Diabetes Survival Skills Education) will improve Outcome: Progressing Goal: Individualized Educational Video(s) Outcome: Progressing   

## 2023-10-08 NOTE — Assessment & Plan Note (Signed)
 Early acute osteomyelitis found on CT. Treatment plan as above.

## 2023-10-08 NOTE — Progress Notes (Signed)
Daily Progress Note Intern Pager: [phone]  Patient name: Elizabeth Schwartz Medical record number: [id number] Date of birth: 12-26-1945 Age: 77 y.o. Gender: female  Primary Care Provider: Storm Frisk, MD Consultants: Plastic surgery, ID Code Status: Full   Pt Overview and Major Events to Date:  11/3: Admitted to FMTS 11/11: I&D L thigh 11/20: left thigh wound evaluation under anesthesia w/ debridement   Assessment and Plan:   77 year old female with PMHx HLD, schizophrenia, dementia, COPD, osteoporosis and T2DM who was admitted for left thigh wound after a crush injury and found to have imaging concerning for necrotizing myositis and early osteomyelitis. Now medically ready for and pending SNF placement and legal guardian. 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 Pain is managed.  Wound culture growing abundant Pseudomonas with some level of resistance to everything but gentamicin and imipenem. -Plastic surgery following, will see her around 11/27 -ID following, appreciate recommendations -Continue Meropenem -Pain management: Scheduled tylenol 650mg  q6h Osteomyelitis of left femur (HCC) Early acute osteomyelitis found on CT. Treatment plan as above. Skin ulcer of left great toe (HCC) Noted on admission, continues to appear stable. -Vaseline to feet BID Left leg swelling Persistent.  Ddx cellulitis, DVT, sequalae of L thigh injury / surgery. No changes today.  -Consider DVT doppler study LLE if symptoms worsen   Chronic and Stable Issues: T2DM: holding home metforming 500mg  BID, CBGs have been stable HLD: Have not started home Zetia 10 mg daily, fenofibrate 160 mg daily (unclear if taking) Schizophrenia: Continue home clozapine 200 mg at bedtime  COPD: Dulera 2puff BID, albuterol as needed Osteoporosis: Have not ordered alendronate 70 mg once a week on Wednesdays per chart review Dementia:  Memantine 5 mg twice daily, Aricept 10 mg at bedtime Normocytic anemia: Hgb stable between 7-8 throughout admission   FEN/GI: Regular PPx: Lovenox Dispo:SNF  pending placement .  Medically stable for discharge.  Subjective:  Pain in her left extremity is controlled.  She was able to sleep much better last night.  She does have some pain on her bottom and feels like she is wet.  Objective: Temp:  [98.4 F (36.9 C)-99.1 F (37.3 C)] 99.1 F (37.3 C) (11/23 2031) Pulse Rate:  [85-95] 90 (11/23 2031) Resp:  [18] 18 (11/23 2031) BP: (111-135)/(55-60) 111/55 (11/23 2031) SpO2:  [93 %-96 %] 93 % (11/23 2031) Physical Exam: General: Lying in bed, no acute distress, conversant Cardiovascular: Regular rate and rhythm without murmurs rubs or gallops, 1-2+ pitting edema of the left lower extremity Respiratory: Clear to auscultation bilaterally anteriorly without wheezes rales or rhonchi Abdomen: Nondistended, normoactive bowel sounds Extremities: Left thigh bandaged  Laboratory: Most recent CBC Lab Results  Component Value Date   WBC 5.6 10/06/2023   HGB 8.0 (L) 10/06/2023   HCT 25.8 (L) 10/06/2023   MCV 91.5 10/06/2023   PLT 365 10/06/2023   Most recent BMP    Latest Ref Rng & Units 10/02/2023    8:05 AM  BMP  Glucose 70 - 99 mg/dL 188   BUN 8 - 23 mg/dL 15   Creatinine 4.16 - 1.00 mg/dL 6.06   Sodium 301 - 601 mmol/L 143   Potassium 3.5 - 5.1 mmol/L 3.7   Chloride 98 - 111 mmol/L 112   CO2 22 - 32 mmol/L 24   Calcium 8.9 - 10.3 mg/dL 9.4     Evette Georges, MD 10/08/2023, 5:46 AM  PGY-2,  [facility] FPTS Intern pager: [phone], text pages welcome Secure chat group [facility] Teaching Service

## 2023-10-09 ENCOUNTER — Other Ambulatory Visit: Payer: Self-pay

## 2023-10-09 DIAGNOSIS — L89224 Pressure ulcer of left hip, stage 4: Secondary | ICD-10-CM | POA: Diagnosis not present

## 2023-10-09 DIAGNOSIS — M60852 Other myositis, left thigh: Secondary | ICD-10-CM | POA: Diagnosis not present

## 2023-10-09 LAB — GLUCOSE, CAPILLARY
Glucose-Capillary: 104 mg/dL — ABNORMAL HIGH (ref 70–99)
Glucose-Capillary: 100 mg/dL — ABNORMAL HIGH (ref 70–99)
Glucose-Capillary: 137 mg/dL — ABNORMAL HIGH (ref 70–99)

## 2023-10-09 NOTE — Plan of Care (Signed)
  Problem: Education: Goal: Ability to describe self-care measures that may prevent or decrease complications (Diabetes Survival Skills Education) will improve Outcome: Progressing Goal: Individualized Educational Video(s) Outcome: Progressing   

## 2023-10-09 NOTE — Assessment & Plan Note (Addendum)
Persistent.  Ddx cellulitis, DVT, sequalae of L thigh injury / surgery. No changes today.  -Consider DVT doppler study LLE if symptoms worsen

## 2023-10-09 NOTE — Progress Notes (Addendum)
[facility]  Date of Admission:  09/17/2023   Total days of inpatient antibiotics 17  Principal Problem:   Pressure injury of left thigh, stage 4 (HCC) Active Problems:   Normocytic anemia   Long term current use of clozapine   Myositis of left thigh   Osteomyelitis of left femur (HCC)   Necrotizing myositis   Diabetes mellitus without complication (HCC)   Dementia (HCC)   Goals of care, counseling/discussion   Skin ulcer of left great toe (HCC)   Left leg swelling   Type 2 diabetes mellitus without complication (HCC)   Anemia          Assessment: 77 year old female with diabetes, dementia presented to ED with worsening wound to the left inner thigh with purulent drainage admitted with: #Necrotizing myositis of medial left thigh/deep tissue wound #Possible acute early osteomyelitis of mid left femur - She was admitted for fall 9/25 - 10/4, neighbors found her pinned to the ground with stove on top of her.  She had compressive injury to the left thigh with rhabdomyolysis. - This admission she underwent debridement of wound on 11/11 with cultures growing Pseudomonas, E faecalis and Bacteroides she was transition to meropenem. - Taken to the OR on 11/20 for debridement Pseudomonas aeruginosa, MRSA.  Per note there was necrotic tissue next to femur and medial aspect of wound.  Recommendations: -Continue merrem and add daptomycin -Follow OR cx from 11/20 -Place PICC, anticipate 3 weeks antibiotics from the OR   Microbiology:   Antibiotics: Meropenem Cultures: Blood 11/3 NG   Other 11/11 PsA, E faecalis, bacteroids distanis, MRSA 11/20 OR cx ng  SUBJECTIVE: Resting in bed.  No new complaints Interval: Afebrile overnight..  Review of Systems: Review of Systems  All other systems reviewed and are negative.    Scheduled Meds:  acetaminophen  650 mg Oral Q6H   Or   acetaminophen  650 mg Rectal Q6H   ascorbic acid  250 mg Oral  BID   clozapine  200 mg Oral QHS   donepezil  10 mg Oral QHS   enoxaparin (LOVENOX) injection  40 mg Subcutaneous Q24H   memantine  5 mg Oral BID   mometasone-formoterol  2 puff Inhalation BID   multivitamin with minerals  1 tablet Oral Daily   nutrition supplement (JUVEN)  1 packet Oral BID BM   sodium chloride flush  10 mL Intravenous Q12H   white petrolatum   Topical BID   Continuous Infusions:  DAPTOmycin Stopped (10/08/23 1514)   meropenem (MERREM) IV 1 g (10/09/23 0506)   PRN Meds:.albuterol Allergies  Allergen Reactions   Crestor [Rosuvastatin] Other (See Comments)    Muscle aches.     OBJECTIVE: Vitals:   10/08/23 1543 10/08/23 2134 10/09/23 0418 10/09/23 0725  BP: 137/61 (!) 145/61 (!) 142/59 (!) 126/59  Pulse: 86 89 81 88  Resp:  18  17  Temp: 98.6 F (37 C) 98.6 F (37 C) 98.4 F (36.9 C) 98.1 F (36.7 C)  TempSrc:  Oral Oral Oral  SpO2: 97% 95% 95% 96%  Weight:      Height:       Body mass index is 27.46 kg/m.  Physical Exam Constitutional:      Appearance: Normal appearance.  HENT:     Head: Normocephalic and atraumatic.     Right Ear: Tympanic membrane normal.     Left Ear: Tympanic membrane normal.     Nose:  Nose normal.     Mouth/Throat:     Mouth: Mucous membranes are moist.  Eyes:     Extraocular Movements: Extraocular movements intact.     Conjunctiva/sclera: Conjunctivae normal.     Pupils: Pupils are equal, round, and reactive to light.  Cardiovascular:     Rate and Rhythm: Normal rate and regular rhythm.     Heart sounds: No murmur heard.    No friction rub. No gallop.  Pulmonary:     Effort: Pulmonary effort is normal.     Breath sounds: Normal breath sounds.  Abdominal:     General: Abdomen is flat.     Palpations: Abdomen is soft.  Musculoskeletal:     Comments: Left leg wound  Skin:    General: Skin is warm and dry.  Neurological:     General: No focal deficit present.     Mental Status: She is alert and oriented to  person, place, and time.  Psychiatric:        Mood and Affect: Mood normal.       Lab Results Lab Results  Component Value Date   WBC 5.6 10/06/2023   HGB 8.0 (L) 10/06/2023   HCT 25.8 (L) 10/06/2023   MCV 91.5 10/06/2023   PLT 365 10/06/2023    Lab Results  Component Value Date   CREATININE 0.80 10/02/2023   BUN 15 10/02/2023   NA 143 10/02/2023   K 3.7 10/02/2023   CL 112 (H) 10/02/2023   CO2 24 10/02/2023    Lab Results  Component Value Date   ALT 15 09/20/2023   AST 17 09/20/2023   ALKPHOS 39 09/20/2023   BILITOT 0.4 09/20/2023        Danelle Earthly, MD [facility] 10/09/2023, 1:09 PM I have personally spent 51  minutes involved in face-to-face and non-face-to-face activities for this patient on the day of the visit. Professional time spent includes the following activities: Preparing to see the patient (review of tests), Obtaining and/or reviewing separately obtained history (admission/discharge record), Performing a medically appropriate examination and/or evaluation , Ordering medications/tests/procedures, referring and communicating with other health care professionals, Documenting clinical information in the EMR, Independently interpreting results (not separately reported), Communicating results to the patient/family/caregiver, Counseling and educating the patient/family/caregiver and Care coordination (not separately reported).

## 2023-10-09 NOTE — Assessment & Plan Note (Addendum)
Noted on admission, continues to appear stable. -Vaseline to feet BID

## 2023-10-09 NOTE — Progress Notes (Signed)
PICC order noted, unable to get consent from niece.2 calls, no answer.

## 2023-10-09 NOTE — Progress Notes (Signed)
PHARMACY CONSULT NOTE FOR:  OUTPATIENT  PARENTERAL ANTIBIOTIC THERAPY (OPAT)  Indication: Left thigh soft tissue infection Regimen: Daptomycin 700 mg every 24 hours; Meropenem 1 gm every 8 hours  End date: 10/25/23  IV antibiotic discharge orders are pended. To discharging provider:  please sign these orders via discharge navigator,  Select New Orders & click on the button choice - Manage This Unsigned Work.     Thank you for allowing pharmacy to be a part of this patient's care.  Enos Fling, PharmD PGY-1 Acute Care Pharmacy Resident 10/09/2023 11:28 AM

## 2023-10-09 NOTE — Progress Notes (Signed)
Physical Therapy Treatment Patient Details Name: Elizabeth Schwartz MRN: [id number] DOB: 1945-12-16 Today's Date: 10/09/2023   History of Present Illness Pt is a 77 y/o F admitted on 09/17/23 after being advised by [facility] nurse to go to the ED after assessing pt's wound. CT showed necrotic myositis, pt also found to have acute osteomyelitis. Pt found to have symptomatic orthostatic hypotension during therapies 11/6-11/8, and consistently following week.  S/P I&D 11/11 and 11/20. PMH: anemia, COPD, dementia, depression, DM, HLD, HTN, osteoporosis, schizo-affective, SOB, syncope.    PT Comments  Pt received in supine, agreeable to therapy session and with good participation and fair tolerance for transfer and gait training. Pt limited due to symptomatic orthostatic hypotension with gait trials x2, despite abdominal binder. PTA assisted pt to don LLE soft AFO prior to gait trials and it helped in reducing her fall risk. Pt needing up to heavy minA with dense multimodal cues for transfers and gait. Pt will continue to benefit from skilled rehab in a post acute setting <3 hours/day to maximize functional gains before returning home.  Vital Signs  Patient Position (if appropriate) Orthostatic Vitals  Orthostatic Sitting  BP- Sitting 127/63  Pulse- Sitting 105  Orthostatic Standing at 0 minutes  BP- Standing at 0 minutes 111/57  Pulse- Standing at 0 minutes 122  Orthostatic Standing at 3 minutes  BP- Standing at 3 minutes 93/66  Pulse- Standing at 3 minutes 128     If plan is discharge home, recommend the following: A little help with bathing/dressing/bathroom;Assistance with cooking/housework;Supervision due to cognitive status;Direct supervision/assist for financial management;Assist for transportation;Help with stairs or ramp for entrance;Direct supervision/assist for medications management;A lot of help with walking and/or transfers   Can travel by private vehicle     Yes  Equipment  Recommendations  Rolling walker (2 wheels);BSC/3in1;Other (comment) (may need WC pending progress, orthostatic frequently)    Recommendations for Other Services       Precautions / Restrictions Precautions Precautions: Fall Precaution Comments: Contact; watch BP/HR Required Braces or Orthoses: Other Brace Other Brace: LLE soft AFO "foot up" brace; knee high TED hose and abdominal binder Restrictions Weight Bearing Restrictions: No     Mobility  Bed Mobility Overal bed mobility: Needs Assistance Bed Mobility: Supine to Sit     Supine to sit: Min assist, Used rails     General bed mobility comments: heavy minA from supine to EOB, cues for pt to use bed rail to assist with elevating her trunk and pt with difficulty sequencing despite cues, ultimately needs LUE HHA to fully raise trunk.    Transfers Overall transfer level: Needs assistance Equipment used: Rolling walker (2 wheels) Transfers: Sit to/from Stand, Bed to chair/wheelchair/BSC Sit to Stand: Min assist   Step pivot transfers: Min assist       General transfer comment: Cueing for hand placement and min assist to power up, min assist to step towards R side with increased time and cueing to sequence, assist with RW.    Ambulation/Gait Ambulation/Gait assistance: Min assist Gait Distance (Feet): 18 Feet (104ft, seated break, 31ft) Assistive device: Rolling walker (2 wheels) Gait Pattern/deviations: Step-to pattern, Trunk flexed, Decreased dorsiflexion - left, Decreased weight shift to left, Decreased step length - left, Antalgic Gait velocity: decreased     General Gait Details: Distance limited due to pt c/o fatigue and apparent LLE pain with weight bearing, as well as evolving dizziness/lightheadedness; x2 trials but pt even more dizzy second trial so distance limited at bedside due  to this. RN notified pt needs TED hose for LLE as she only had one sock in room.   Stairs             Wheelchair Mobility      Tilt Bed    Modified Rankin (Stroke Patients Only)       Balance Overall balance assessment: Needs assistance Sitting-balance support: Feet supported, Bilateral upper extremity supported, Single extremity supported Sitting balance-Leahy Scale: Good Sitting balance - Comments: Fair unsupported, good with 1-2 UE support   Standing balance support: Bilateral upper extremity supported, During functional activity Standing balance-Leahy Scale: Poor Standing balance comment: relies on BUE support                            Cognition Arousal: Alert Behavior During Therapy: Flat affect Overall Cognitive Status: History of cognitive impairments - at baseline                                 General Comments: Pt pleasant but confused. Able to follow simple commands, increased time for problem solving and sequencing at times. Pt at times with disorganized/perseverative speech as she fatigues.        Exercises Other Exercises Other Exercises: seated BLE AROM: LAQ x5 reps ea x2 sets with rest break between Other Exercises: STS x 4 reps for BLE strengthening    General Comments General comments (skin integrity, edema, etc.): see BP above; orthostatic hypotension limiting her      Pertinent Vitals/Pain Pain Assessment Pain Assessment: Faces Faces Pain Scale: Hurts little more Pain Location: L thigh and leg with AAROM L DF Pain Descriptors / Indicators: Grimacing, Discomfort, Guarding Pain Intervention(s): Limited activity within patient's tolerance, Monitored during session, Repositioned     PT Goals (current goals can now be found in the care plan section) Acute Rehab PT Goals Patient Stated Goal: none stated PT Goal Formulation: With patient Time For Goal Achievement: 10/13/23 Progress towards PT goals: Progressing toward goals    Frequency    Min 1X/week      PT Plan         AM-PAC PT "6 Clicks" Mobility   Outcome Measure  Help  needed turning from your back to your side while in a flat bed without using bedrails?: A Little Help needed moving from lying on your back to sitting on the side of a flat bed without using bedrails?: A Lot (w/o rail) Help needed moving to and from a bed to a chair (including a wheelchair)?: A Lot Help needed standing up from a chair using your arms (e.g., wheelchair or bedside chair)?: A Lot Help needed to walk in hospital room?: Total (<34ft) Help needed climbing 3-5 steps with a railing? : Total 6 Click Score: 11    End of Session Equipment Utilized During Treatment: Gait belt Activity Tolerance: Patient limited by fatigue;Treatment limited secondary to medical complications (Comment);Other (comment) (orthostatic hypotension) Patient left: in chair;with call bell/phone within reach;with chair alarm set;Other (comment) (LLE AFO on, tighten abdominal binder before getting her up.) Nurse Communication: Mobility status;Other (comment) (remove abdominal binder, L AFO and gait belt before getting her back to bed from chair; pt still orthostatic with standing; pt needs new purewick) PT Visit Diagnosis: Muscle weakness (generalized) (M62.81);Pain;Other abnormalities of gait and mobility (R26.89);Difficulty in walking, not elsewhere classified (R26.2) Pain - Right/Left: Left Pain - part of body: Leg;Shoulder  Time: 0981-1914 PT Time Calculation (min) (ACUTE ONLY): 42 min  Charges:    $Gait Training: 8-22 mins $Therapeutic Exercise: 8-22 mins $Therapeutic Activity: 8-22 mins PT General Charges $$ ACUTE PT VISIT: 1 Visit                     Jeronimo Hellberg P., PTA Acute Rehabilitation Services Secure Chat Preferred 9a-5:30pm Office: [phone]    Dorathy Kinsman [facility] 10/09/2023, 2:42 PM

## 2023-10-09 NOTE — Progress Notes (Signed)
Daily Progress Note Intern Pager: [phone]  Patient name: Elizabeth Schwartz Medical record number: [id number] Date of birth: 10-06-1946 Age: 77 y.o. Gender: female  Primary Care Provider: Storm Frisk, MD Consultants: plastic surgery, ID  Code Status: Full   Pt Overview and Major Events to Date:  11/3: Admitted to [facility] 11/11: I&D L thigh 11/20: left thigh wound evaluation under anesthesia w/ debridement  Assessment and Plan:  77 year old female with PMHx HLD, schizophrenia, dementia, COPD, osteoporosis and T2DM who was admitted for left thigh wound after a crush injury and found to have imaging concerning for necrotizing myositis and early osteomyelitis.  Now pending SNF placement, and legal guardian. APS accepted pt's case.  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 Pain is managed.  Wound culture growing abundant Pseudomonas with some level of resistance to everything but gentamicin and imipenem. -Plastic surgery following, will see her around 11/27 -ID following, appreciate recommendations -Continue Meropenem, Daptomycin -Pain management: Scheduled tylenol 650mg  q6h Osteomyelitis of left femur (HCC) Early acute osteomyelitis found on CT. Treatment plan as above. Skin ulcer of left great toe (HCC) Noted on admission, continues to appear stable. -Vaseline to feet BID Left leg swelling Persistent.  Ddx cellulitis, DVT, sequalae of L thigh injury / surgery. No changes today.  -Consider DVT doppler study LLE if symptoms worsen  Chronic and Stable Problems:  T2DM: holding home metforming 500mg  BID, CBGs have been stable HLD: Have not started home Zetia 10 mg daily, fenofibrate 160 mg daily (unclear if taking) Schizophrenia: Continue home clozapine 200 mg at bedtime  COPD: Dulera 2puff BID, albuterol as needed Osteoporosis: Have not ordered alendronate 70 mg once a week on Wednesdays per chart  review Dementia: Memantine 5 mg twice daily, Aricept 10 mg at bedtime Normocytic anemia: Hgb stable between 7-8 throughout admission   FEN/GI: regular PPx: lovenox Dispo:SNF . Barriers include protective order by APS.   Subjective:  No acute events overnight. Pt does complain of L foot pain and swelling x2 days.   Objective: Temp:  [98.1 F (36.7 C)-98.6 F (37 C)] 98.1 F (36.7 C) (11/25 0725) Pulse Rate:  [81-89] 88 (11/25 0725) Resp:  [17-18] 17 (11/25 0725) BP: (126-145)/(59-61) 126/59 (11/25 0725) SpO2:  [95 %-97 %] 96 % (11/25 0725) Physical Exam: General: No acute distress, resting comfortably Respiratory: no increased WOB on RA Extremities: TTP to entire plantar surface of L foot. Full ROM L ankle with no pain. Neurovascularly intact. 2+ PT/DP pulses BLE. No increased swelling, warmth, or redness to LLE. No calf tenderness or warmth BLE. No new ulcers or skin changes.   Laboratory: Most recent CBC Lab Results  Component Value Date   WBC 5.6 10/06/2023   HGB 8.0 (L) 10/06/2023   HCT 25.8 (L) 10/06/2023   MCV 91.5 10/06/2023   PLT 365 10/06/2023   Most recent BMP    Latest Ref Rng & Units 10/02/2023    8:05 AM  BMP  Glucose 70 - 99 mg/dL 130   BUN 8 - 23 mg/dL 15   Creatinine 8.65 - 1.00 mg/dL 7.84   Sodium 696 - 295 mmol/L 143   Potassium 3.5 - 5.1 mmol/L 3.7   Chloride 98 - 111 mmol/L 112   CO2 22 - 32 mmol/L 24   Calcium 8.9 - 10.3 mg/dL 9.4    CK 38  Taelyr Jantz, DO 10/09/2023, 9:44 AM  PGY-1, [facility]  FPTS Intern pager: [phone], text pages welcome Secure chat group [facility] Teaching Service

## 2023-10-09 NOTE — Assessment & Plan Note (Addendum)
2/2 crush injury now s/p I&D on 11/11 and additional debridement on 11/20. Pain is managed.  Wound culture growing abundant Pseudomonas with some level of resistance to everything but gentamicin and imipenem. -Plastic surgery following, will see her around 11/27 -ID following, appreciate recommendations -Continue Meropenem, Daptomycin -Pain management: Scheduled tylenol 650mg  q6h

## 2023-10-09 NOTE — Assessment & Plan Note (Addendum)
 Early acute osteomyelitis found on CT. Treatment plan as above.

## 2023-10-09 NOTE — Progress Notes (Signed)
Mobility Specialist: Progress Note   10/09/23 1629  Mobility  Activity Transferred from chair to bed  Level of Assistance Contact guard assist, steadying assist  Assistive Device Front wheel walker  Distance Ambulated (ft) 3 ft  Activity Response Tolerated well  Mobility Referral Yes  $Mobility charge 1 Mobility  Mobility Specialist Start Time (ACUTE ONLY) 1550  Mobility Specialist Stop Time (ACUTE ONLY) 1608  Mobility Specialist Time Calculation (min) (ACUTE ONLY) 18 min    Pt was agreeable to mobility session - received in chair. C/o quick fatigue during ambulation. MinA for STS, CG for ambulation. SV for bed mobility. Left in bed with all needs met, call bell in reach.   Maurene Capes Mobility Specialist Please contact via SecureChat or Rehab office at [phone]

## 2023-10-10 DIAGNOSIS — L89224 Pressure ulcer of left hip, stage 4: Secondary | ICD-10-CM | POA: Diagnosis not present

## 2023-10-10 LAB — GLUCOSE, CAPILLARY: Glucose-Capillary: 138 mg/dL — ABNORMAL HIGH (ref 70–99)

## 2023-10-10 MED ORDER — SODIUM CHLORIDE 0.9% FLUSH
10.0000 mL | INTRAVENOUS | Status: DC | PRN
  Administered 2023-10-23 – 2023-10-24 (×2): 10 mL

## 2023-10-10 MED ORDER — SODIUM CHLORIDE 0.9% FLUSH
10.0000 mL | Freq: Two times a day (BID) | INTRAVENOUS | Status: DC
  Administered 2023-10-10 – 2023-10-14 (×6): 10 mL
  Administered 2023-10-14: 40 mL
  Administered 2023-10-15: 10 mL
  Administered 2023-10-15: 20 mL
  Administered 2023-10-16: 10 mL
  Administered 2023-10-16: 20 mL
  Administered 2023-10-17 – 2023-10-19 (×5): 10 mL
  Administered 2023-10-20: 30 mL
  Administered 2023-10-21 – 2023-10-23 (×2): 20 mL
  Administered 2023-10-25 – 2023-11-01 (×4): 10 mL

## 2023-10-10 MED ORDER — CHLORHEXIDINE GLUCONATE CLOTH 2 % EX PADS
6.0000 | MEDICATED_PAD | Freq: Every day | CUTANEOUS | Status: DC
  Administered 2023-10-10 – 2023-11-01 (×24): 6 via TOPICAL

## 2023-10-10 NOTE — Plan of Care (Signed)
Problem: Nutritional: Goal: Maintenance of adequate nutrition will improve Outcome: Progressing   Problem: Education: Goal: Knowledge of General Education information will improve Description: Including pain rating scale, medication(s)/side effects and non-pharmacologic comfort measures Outcome: Progressing

## 2023-10-10 NOTE — Progress Notes (Signed)
Examined Elizabeth Schwartz today and performed her dressing change.  She states she is doing well with little to no pain and what pain she has is controlled with Tylenol.  She is still somewhat confused as she told me that she would be taking her dressings home to launder them.  On inspection her her dressings are completely green in appearance with a very classic odor of Pseudomonas.  The wound itself is relatively clean with only a few small areas of devitalized tissue.  The tissue in the wound is granulating and has filled in since I saw her in the operating room last week.  Photographs are in the chart.   I have recommended to the primary care team that the discuss with ID the possibility of local antibiotics and whether this would be of any benefit for Elizabeth Schwartz.  At the moment I have little to offer other than potentially another debridement next week.  Will increase the frequency of her dressing changes to twice daily.  Will continue to follow.

## 2023-10-10 NOTE — Assessment & Plan Note (Addendum)
 Early acute osteomyelitis found on CT. Treatment plan as above.

## 2023-10-10 NOTE — Progress Notes (Signed)
Physical Therapy Treatment Patient Details Name: Elizabeth Schwartz MRN: [id number] DOB: 11-04-1946 Today's Date: 10/10/2023   History of Present Illness Pt is a 77 y/o F admitted on 09/17/23 after being advised by [facility] nurse to go to the ED after assessing pt's wound. CT showed necrotic myositis, pt also found to have acute osteomyelitis. Pt found to have symptomatic orthostatic hypotension during therapies 11/6-11/8, and consistently following week.  S/P I&D 11/11 and 11/20. PMH: anemia, COPD, dementia, depression, DM, HLD, HTN, osteoporosis, schizo-affective, SOB, syncope.    PT Comments  Pt tolerated treatment well today. Pt BP stable today and able to progress ambulation in hallway with RW +2 Min A with a chair follow. No change in DC/DME recs at this time. PT will continue to follow.    If plan is discharge home, recommend the following: A little help with bathing/dressing/bathroom;Assistance with cooking/housework;Supervision due to cognitive status;Direct supervision/assist for financial management;Assist for transportation;Help with stairs or ramp for entrance;Direct supervision/assist for medications management;A lot of help with walking and/or transfers   Can travel by private vehicle     Yes  Equipment Recommendations  Rolling walker (2 wheels);BSC/3in1;Other (comment)    Recommendations for Other Services       Precautions / Restrictions Precautions Precautions: Fall Precaution Comments: Contact; watch BP/HR Required Braces or Orthoses: Other Brace Other Brace: LLE soft AFO "foot up" brace; knee high TED hose and abdominal binder Restrictions Weight Bearing Restrictions: No     Mobility  Bed Mobility               General bed mobility comments: In recliner    Transfers Overall transfer level: Needs assistance Equipment used: Rolling walker (2 wheels) Transfers: Sit to/from Stand Sit to Stand: Min assist           General transfer comment: cueing for hand  placement and min A to power up when pushing from bed with L hand    Ambulation/Gait Ambulation/Gait assistance: Min assist, +2 safety/equipment Gait Distance (Feet): 35 Feet Assistive device: Rolling walker (2 wheels) Gait Pattern/deviations: Step-to pattern, Trunk flexed, Decreased dorsiflexion - left, Decreased weight shift to left, Decreased step length - left, Antalgic Gait velocity: decreased     General Gait Details: no LOB noted. Chair follow provided for safety. Very slowed antalgic gait. Pt wheeled back to room once fatigued.   Stairs             Wheelchair Mobility     Tilt Bed    Modified Rankin (Stroke Patients Only)       Balance Overall balance assessment: Needs assistance Sitting-balance support: Feet supported, Single extremity supported, No upper extremity supported Sitting balance-Leahy Scale: Good Sitting balance - Comments: statically with supervision   Standing balance support: Bilateral upper extremity supported, During functional activity, Single extremity supported Standing balance-Leahy Scale: Poor Standing balance comment: relies on external support                            Cognition Arousal: Alert Behavior During Therapy: Flat affect Overall Cognitive Status: History of cognitive impairments - at baseline                                 General Comments: Pt pleasant but confused. Able to follow simple commands, increased time for problem solving and sequencing at times. Pt at times with disorganized/perseverative speech as she fatigues.  Exercises      General Comments General comments (skin integrity, edema, etc.): BP: 131/77 seated. BP: 119/70 standing      Pertinent Vitals/Pain Pain Assessment Pain Assessment: Faces Faces Pain Scale: Hurts little more Pain Location: L thigh Pain Descriptors / Indicators: Grimacing, Discomfort, Guarding Pain Intervention(s): Monitored during session,  Limited activity within patient's tolerance, Repositioned, Premedicated before session    Home Living                          Prior Function            PT Goals (current goals can now be found in the care plan section) Progress towards PT goals: Progressing toward goals    Frequency    Min 1X/week      PT Plan      Co-evaluation              AM-PAC PT "6 Clicks" Mobility   Outcome Measure  Help needed turning from your back to your side while in a flat bed without using bedrails?: A Little Help needed moving from lying on your back to sitting on the side of a flat bed without using bedrails?: A Lot Help needed moving to and from a bed to a chair (including a wheelchair)?: A Little Help needed standing up from a chair using your arms (e.g., wheelchair or bedside chair)?: A Little Help needed to walk in hospital room?: A Little Help needed climbing 3-5 steps with a railing? : Total 6 Click Score: 15    End of Session Equipment Utilized During Treatment: Gait belt Activity Tolerance: Patient tolerated treatment well Patient left: in chair;with call bell/phone within reach;with chair alarm set;Other (comment) Nurse Communication: Mobility status PT Visit Diagnosis: Muscle weakness (generalized) (M62.81);Pain;Other abnormalities of gait and mobility (R26.89);Difficulty in walking, not elsewhere classified (R26.2) Pain - Right/Left: Left Pain - part of body: Leg;Shoulder     Time: 1610-9604 PT Time Calculation (min) (ACUTE ONLY): 27 min  Charges:    $Gait Training: 23-37 mins PT General Charges $$ ACUTE PT VISIT: 1 Visit                     Elizabeth Schwartz, PT, DPT Acute Rehab Services [phone]    Elizabeth Schwartz 10/10/2023, 4:41 PM

## 2023-10-10 NOTE — Progress Notes (Signed)
Occupational Therapy Treatment Patient Details Name: Elizabeth Schwartz MRN: [id number] DOB: [birth year] Today's Date: 10/10/2023   History of present illness Pt is a 77 y/o F admitted on 09/17/23 after being advised by [facility] nurse to go to the ED after assessing pt's wound. CT showed necrotic myositis, pt also found to have acute osteomyelitis. Pt found to have symptomatic orthostatic hypotension during therapies 11/6-11/8, and consistently following week.  S/P I&D 11/11 and 11/20. PMH: anemia, COPD, dementia, depression, DM, HLD, HTN, osteoporosis, schizo-affective, SOB, syncope.   OT comments  Patient supine in bed and agreeable to OT.  Pt reports discomfort remains in L thigh, but better than it has been.  She requires mod assist for bed mobility, min guard for transfers using RW today.  Pt reports initial dizziness with sitting and standing that fades, and BP not formally assessed today. Continues to require cueing for hand placement and sequencing due to cognition.  Will follow acutely.        If plan is discharge home, recommend the following:  A little help with walking and/or transfers;Assistance with cooking/housework;Direct supervision/assist for financial management;Direct supervision/assist for medications management;Assist for transportation;Help with stairs or ramp for entrance;A little help with bathing/dressing/bathroom   Equipment Recommendations  Other (comment) (defer)    Recommendations for Other Services      Precautions / Restrictions Precautions Precautions: Fall Precaution Comments: Contact; watch BP/HR Required Braces or Orthoses: Other Brace Other Brace: LLE soft AFO "foot up" brace; knee high TED hose and abdominal binder Restrictions Weight Bearing Restrictions: No       Mobility Bed Mobility Overal bed mobility: Needs Assistance Bed Mobility: Rolling, Sidelying to Sit Rolling: Min assist Sidelying to sit: Mod assist, HOB elevated       General bed  mobility comments: increased assist exiting towards R side compared to L side; difficulty sequencing and requires cueing    Transfers Overall transfer level: Needs assistance Equipment used: Rolling walker (2 wheels) Transfers: Sit to/from Stand Sit to Stand: Contact guard assist           General transfer comment: cueing for hand placement and min guard to power up when pushing from bed with L hand     Balance Overall balance assessment: Needs assistance Sitting-balance support: Feet supported, Single extremity supported, No upper extremity supported Sitting balance-Leahy Scale: Good Sitting balance - Comments: statically with supervision   Standing balance support: Bilateral upper extremity supported, During functional activity, Single extremity supported Standing balance-Leahy Scale: Poor Standing balance comment: relies on external support                           ADL either performed or assessed with clinical judgement   ADL Overall ADL's : Needs assistance/impaired     Grooming: Oral care;Set up;Wash/dry face           Upper Body Dressing : Set up;Sitting   Lower Body Dressing: Minimal assistance;Sit to/from stand Lower Body Dressing Details (indicate cue type and reason): assist for L sock, min guard assist in standing Toilet Transfer: Contact guard assist;Ambulation;Rolling walker (2 wheels) Toilet Transfer Details (indicate cue type and reason): to reclienr         Functional mobility during ADLs: Contact guard assist;Rolling walker (2 wheels)      Extremity/Trunk Assessment              Vision       Perception     Praxis  Cognition Arousal: Alert Behavior During Therapy: Flat affect Overall Cognitive Status: History of cognitive impairments - at baseline                                 General Comments: Pt pleasant but confused. Able to follow simple commands, increased time for problem solving and  sequencing at times. Pt at times with disorganized/perseverative speech as she fatigues.        Exercises      Shoulder Instructions       General Comments pt reports mild dizziness with inital position change but improves with prolonged sitting/standing, standing only briefly to step to recliner today. Requested new smaller cuff for L UE due to picc line placement to R UE.    Pertinent Vitals/ Pain       Pain Assessment Pain Assessment: Faces Faces Pain Scale: Hurts little more Pain Location: L thigh Pain Descriptors / Indicators: Grimacing, Discomfort, Guarding Pain Intervention(s): Limited activity within patient's tolerance, Monitored during session, Premedicated before session, Repositioned  Home Living                                          Prior Functioning/Environment              Frequency  Min 1X/week        Progress Toward Goals  OT Goals(current goals can now be found in the care plan section)  Progress towards OT goals: Progressing toward goals  Acute Rehab OT Goals Patient Stated Goal: rehab OT Goal Formulation: With patient Time For Goal Achievement: 10/26/23 Potential to Achieve Goals: Good  Plan      Co-evaluation                 AM-PAC OT "6 Clicks" Daily Activity     Outcome Measure   Help from another person eating meals?: A Little Help from another person taking care of personal grooming?: A Little Help from another person toileting, which includes using toliet, bedpan, or urinal?: A Lot Help from another person bathing (including washing, rinsing, drying)?: A Little Help from another person to put on and taking off regular upper body clothing?: A Little Help from another person to put on and taking off regular lower body clothing?: A Little 6 Click Score: 17    End of Session Equipment Utilized During Treatment: Gait belt;Rolling walker (2 wheels);Other (comment) (abd binder)  OT Visit Diagnosis: Other  abnormalities of gait and mobility (R26.89);Muscle weakness (generalized) (M62.81);Pain;Other symptoms and signs involving cognitive function Pain - Right/Left: Left Pain - part of body: Leg   Activity Tolerance Patient tolerated treatment well   Patient Left in chair;with call bell/phone within reach;with chair alarm set   Nurse Communication Mobility status;Other (comment) (smaller BP cuff)        Time: 5188-4166 OT Time Calculation (min): 19 min  Charges: OT General Charges $OT Visit: 1 Visit OT Treatments $Self Care/Home Management : 8-22 mins  Barry Brunner, OT Acute Rehabilitation Services Office [phone]   Chancy Milroy 10/10/2023, 2:18 PM

## 2023-10-10 NOTE — Assessment & Plan Note (Deleted)
Noted on admission, continues to appear stable. -Vaseline to feet BID

## 2023-10-10 NOTE — Assessment & Plan Note (Addendum)
2/2 crush injury now s/p I&D on 11/11 and additional debridement on 11/20. Pain is managed.  Wound culture growing abundant Pseudomonas with some level of resistance to everything but gentamicin and imipenem. -Plastic surgery following, will see her around 11/27, reached out to Dr. Ladona Ridgel today for dressing change -ID following, appreciate recommendations -Continue Meropenem, Daptomycin -Pain management: Scheduled tylenol 650mg  q6h

## 2023-10-10 NOTE — H&P (View-Only) (Signed)
 Examined Elizabeth Schwartz today and performed her dressing change.  She states she is doing well with little to no pain and what pain she has is controlled with Tylenol.  She is still somewhat confused as she told me that she would be taking her dressings home to launder them.  On inspection her her dressings are completely green in appearance with a very classic odor of Pseudomonas.  The wound itself is relatively clean with only a few small areas of devitalized tissue.  The tissue in the wound is granulating and has filled in since I saw her in the operating room last week.  Photographs are in the chart.   I have recommended to the primary care team that the discuss with ID the possibility of local antibiotics and whether this would be of any benefit for [name].  At the moment I have little to offer other than potentially another debridement next week.  Will increase the frequency of her dressing changes to twice daily.  Will continue to follow.

## 2023-10-10 NOTE — Plan of Care (Signed)
Problem: Education: Goal: Ability to describe self-care measures that may prevent or decrease complications (Diabetes Survival Skills Education) will improve Outcome: Progressing Goal: Individualized Educational Video(s) Outcome: Progressing   Problem: Coping: Goal: Ability to adjust to condition or change in health will improve Outcome: Progressing   Problem: Skin Integrity: Goal: Risk for impaired skin integrity will decrease Outcome: Progressing

## 2023-10-10 NOTE — Progress Notes (Signed)
Nutrition Follow-up  DOCUMENTATION CODES:   Not applicable  INTERVENTION:   -Continue regular diet with protein focused snacks BID.  -Continue -1 packet Juven BID, each packet provides 95 calories, 2.5 grams of protein (collagen), and 9.8 grams of carbohydrate (3 grams sugar); also contains 7 grams of L-arginine and L-glutamine, 300 mg vitamin C, 15 mg vitamin E, 1.2 mcg vitamin B-12, 9.5 mg zinc, 200 mg calcium, and 1.5 g  Calcium Beta-hydroxy-Beta-methylbutyrate to support wound healing.  -Please update weight.    NUTRITION DIAGNOSIS:   Increased nutrient needs related to wound healing as evidenced by estimated needs.  -ongoing, addressing with meals/protein snacks BID, Juven BID.   GOAL:   Patient will meet greater than or equal to 90% of their needs  -Progressing  MONITOR:   PO intake, Weight trends, Skin, Labs  REASON FOR ASSESSMENT:   Follow up  ASSESSMENT:   77 y/o female presented with left thigh wound after a crush injury last month. Imaging showed evidenced of necrotizing myositis and acute early osteomyelitis.  Recent hospitalization on 9/25-10/4/24  after a stove fell on her leg causing pressure injury of the left thigh.  She was found by neighbors and had rhabdo. Underwent SNF discharge after hospital stay and was recently discharged.  PMH: DM2, HTN, HLD, osteoporosis, COPD, anemia, schizophrenia, dementia.  11/11- underwent debridement of wound, cultures growing Pseudomonas, E faecalis and Bacteroides. 11/20- Taken to the OR for debridement Pseudomonas aeruginosa, MRSA.  There was necrotic tissue next to femur and medial aspect of wound.   Needs PICC line placement for IV ABX.  Intakes recorded average 53% x 8 meals. Patient states she is likes the meals and maintains a good appetite. States she is taking the Juven and the snacks, RN confirmed she is drinking the Juven, Unclear if she is receiving the snacks consistently, RN is checking. Needs updated  weight, none since admit. Edema 1+ LLE.  Medications reviewed and include vitamin C 250 mg 2x daily, MVI/Minerals-1 Tab daily,   Labs: CBG 100-138 past 24 hrs     Diet Order:   Diet Order             Diet regular Fluid consistency: Thin  Diet effective now                   EDUCATION NEEDS:   Not appropriate for education at this time  Skin:  Skin Assessment: Skin Integrity Issues: Skin Integrity Issues:: Incisions Incisions: left thigh and left leg incisions Other: non-pressure injury to left anterior distal thigh-moderate drainage, right toe abrasion, blanchable redness to posterior hip  Skin Assessment: Skin Integrity Issues: Skin Integrity Issues: Incisions Incisions: left thigh and left leg incisions   Last BM:  10/09/23  Height:   Ht Readings from Last 1 Encounters:  09/17/23 5\' 5"  (1.651 m)    Weight:   Wt Readings from Last 1 Encounters:  09/17/23 74.8 kg    Ideal Body Weight:  59.1 kg  BMI:  Body mass index is 27.46 kg/m.  Estimated Nutritional Needs:   Kcal:  1650-1900 kcals/day  Protein:  77-89 gm/day  Fluid:  1650-1900 mL/day    Alvino Chapel, RDLD Clinical Dietitian See AMION for contact information

## 2023-10-10 NOTE — Progress Notes (Signed)
Peripherally Inserted Central Catheter Placement  The IV Nurse has discussed with the patient and/or persons authorized to consent for the patient, the purpose of this procedure and the potential benefits and risks involved with this procedure.  The benefits include less needle sticks, lab draws from the catheter, and the patient may be discharged home with the catheter. Risks include, but not limited to, infection, bleeding, blood clot (thrombus formation), and puncture of an artery; nerve damage and irregular heartbeat and possibility to perform a PICC exchange if needed/ordered by physician.  Alternatives to this procedure were also discussed.  Bard Power PICC patient education guide, fact sheet on infection prevention and patient information card has been provided to patient /or left at bedside.    PICC Placement Documentation  PICC Single Lumen 10/10/23 Right Brachial 36 cm 0 cm (Active)  Indication for Insertion or Continuance of Line Home intravenous therapies (PICC only) 10/10/23 1100  Exposed Catheter (cm) 0 cm 10/10/23 1100  Site Assessment Clean, Dry, Intact 10/10/23 1100  Line Status Flushed;Saline locked;Blood return noted 10/10/23 1100  Dressing Type Transparent;Securing device 10/10/23 1100  Dressing Status Antimicrobial disc in place;Clean, Dry, Intact 10/10/23 1100  Line Care Connections checked and tightened 10/10/23 1100  Line Adjustment (NICU/IV Team Only) No 10/10/23 1100  Dressing Intervention New dressing 10/10/23 1100  Dressing Change Due 10/17/23 10/10/23 1100       Franne Grip Renee 10/10/2023, 11:55 AM

## 2023-10-10 NOTE — Assessment & Plan Note (Deleted)
Persistent.  Ddx cellulitis, DVT, sequalae of L thigh injury / surgery. No changes today.  -Consider DVT doppler study LLE if symptoms worsen

## 2023-10-10 NOTE — Plan of Care (Addendum)
Patient needs PICC line for her IV antibiotics. This is medically necessary for her.  She does not have any family or friends who can consent or make medical decisions for her, and patient has dementia who waxes and wanes in capacity to make her own decisions.  Currently in the process of obtaining a legal guardian and an APS case is ongoing.

## 2023-10-10 NOTE — Progress Notes (Signed)
     Daily Progress Note Intern Pager: [phone]  Patient name: Elizabeth Schwartz Medical record number: [id number] Date of birth: 12/29/1945 Age: 77 y.o. Gender: female  Primary Care Provider: Storm Frisk, MD Consultants: plastic surgery, ID  Code Status: Full   Pt Overview and Major Events to Date:  11/3: Admitted to FMTS 11/11: I&D L thigh 11/20: left thigh wound evaluation under anesthesia w/ debridement  Assessment and Plan:  77 year old female with PMHx HLD, schizophrenia, dementia, COPD, osteoporosis and T2DM who was admitted for left thigh wound after a crush injury and found to have imaging concerning for necrotizing myositis and early osteomyelitis.  Now pending SNF placement, and legal guardian. APS accepted pt's case.  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 Pain is managed.  Wound culture growing abundant Pseudomonas with some level of resistance to everything but gentamicin and imipenem. -Plastic surgery following, will see her around 11/27, reached out to Dr. Ladona Schwartz today for dressing change -ID following, appreciate recommendations -Continue Meropenem, Daptomycin -Pain management: Scheduled tylenol 650mg  q6h Osteomyelitis of left femur (HCC) Early acute osteomyelitis found on CT. Treatment plan as above.   Chronic and Stable Problems:  T2DM: holding home metforming 500mg  BID, CBGs have been stable HLD: Have not started home Zetia 10 mg daily, fenofibrate 160 mg daily (unclear if taking) Schizophrenia: Continue home clozapine 200 mg at bedtime  COPD: Dulera 2puff BID, albuterol as needed Osteoporosis: Have not ordered alendronate 70 mg once a week on Wednesdays per chart review Dementia: Memantine 5 mg twice daily, Aricept 10 mg at bedtime Normocytic anemia: Hgb stable between 7-8 throughout admission   FEN/GI: regular PPx: lovenox Dispo:SNF pending placement  Subjective:  No  acute events overnight. No concerns today. Looking forward to her Thanksgiving meal here.   Objective: Temp:  [97.6 F (36.4 C)-98.7 F (37.1 C)] 97.6 F (36.4 C) (11/26 0811) Pulse Rate:  [83-98] 85 (11/26 0811) Resp:  [16-18] 16 (11/26 0811) BP: (126-145)/(51-63) 145/52 (11/26 0811) SpO2:  [94 %-99 %] 94 % (11/26 0850) Physical Exam: General: well appearing, no acute distress Cardiovascular: well perfused Respiratory: no increased WOB on RA Extremities: LLE swelling unchanged from prior exam. RLE no edema or skin changes.   Laboratory: Most recent CBC Lab Results  Component Value Date   WBC 5.6 10/06/2023   HGB 8.0 (L) 10/06/2023   HCT 25.8 (L) 10/06/2023   MCV 91.5 10/06/2023   PLT 365 10/06/2023   Most recent BMP    Latest Ref Rng & Units 10/02/2023    8:05 AM  BMP  Glucose 70 - 99 mg/dL 628   BUN 8 - 23 mg/dL 15   Creatinine 3.15 - 1.00 mg/dL 1.76   Sodium 160 - 737 mmol/L 143   Potassium 3.5 - 5.1 mmol/L 3.7   Chloride 98 - 111 mmol/L 112   CO2 22 - 32 mmol/L 24   Calcium 8.9 - 10.3 mg/dL 9.4     Elizabeth Blackwelder, DO 10/10/2023, 8:56 AM  PGY-1, [place] [facility] FPTS Intern pager: [phone], text pages welcome Secure chat group [facility] Teaching Service

## 2023-10-11 DIAGNOSIS — L89224 Pressure ulcer of left hip, stage 4: Secondary | ICD-10-CM | POA: Diagnosis not present

## 2023-10-11 NOTE — Progress Notes (Signed)
     Daily Progress Note Intern Pager: [phone]  Patient name: Elizabeth Schwartz Medical record number: [id number] Date of birth: 1945-11-21 Age: 77 y.o. Gender: female  Primary Care Provider: Storm Frisk, MD Consultants: infectious disease, plastic surgery Code Status: Full  Pt Overview and Major Events to Date:  11/3: Admitted to FMTS 11/11: I&D L thigh 11/17: Meropenem started 11/20: left thigh wound evaluation under anesthesia w/ debridement 11/24: Daptomycin added 11/26: PICC placed  Assessment and Plan:  77 year old female with PMHx HLD, schizophrenia, dementia, COPD, osteoporosis and T2DM who was admitted for left thigh wound after a crush injury and found to have imaging concerning for necrotizing myositis and early osteomyelitis.  Now pending SNF placement, and legal guardian. APS accepted pt's case. Reached out to [facility] for updates on protective order establishment by APS. TOC called and left message with APS for updates. 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 Pain is managed. Disposition pending APS establishing protective order to assist with pt placement at SNF.  -Plastic surgery following, scheduled for additional wound evaluation 10/17/23 -ID following, appreciate recommendations -Continue Meropenem, Daptomycin via PICC -Pain management: Scheduled tylenol 650mg  q6h Osteomyelitis of left femur (HCC) Early acute osteomyelitis found on CT. Treatment plan as above.   Chronic and Stable Problems:  T2DM: holding home metforming 500mg  BID, CBGs have been stable HLD: Have not started home Zetia 10 mg daily, fenofibrate 160 mg daily (unclear if taking) Schizophrenia: Continue home clozapine 200 mg at bedtime  COPD: Dulera 2puff BID, albuterol as needed Osteoporosis: Have not ordered alendronate 70 mg once a week on Wednesdays per chart review Dementia: Memantine 5 mg twice daily, Aricept 10 mg  at bedtime Normocytic anemia: Hgb stable between 7-8 throughout admission   FEN/GI: regular PPx: lovenox Dispo:SNF Barriers include placement.   Subjective:  No acute events overnight. No concerns today.  Objective: Temp:  [97.7 F (36.5 C)-98.5 F (36.9 C)] 97.8 F (36.6 C) (11/27 0829) Pulse Rate:  [82-96] 82 (11/27 0829) Resp:  [16-18] 16 (11/27 0829) BP: (112-135)/(50-59) 112/59 (11/27 0829) SpO2:  [95 %-97 %] 95 % (11/27 0829) Physical Exam: General: well appearing, no acute distress, resting in bed Cardiovascular: well perfused Respiratory: no increased WOB on RA Extremities: stable swelling LLE, no calf tenderness/erythema/warmth. Surgical site LLE remains dressed and C/D/I  Laboratory: Most recent CBC Lab Results  Component Value Date   WBC 5.6 10/06/2023   HGB 8.0 (L) 10/06/2023   HCT 25.8 (L) 10/06/2023   MCV 91.5 10/06/2023   PLT 365 10/06/2023   Most recent BMP    Latest Ref Rng & Units 10/02/2023    8:05 AM  BMP  Glucose 70 - 99 mg/dL 147   BUN 8 - 23 mg/dL 15   Creatinine 8.29 - 1.00 mg/dL 5.62   Sodium 130 - 865 mmol/L 143   Potassium 3.5 - 5.1 mmol/L 3.7   Chloride 98 - 111 mmol/L 112   CO2 22 - 32 mmol/L 24   Calcium 8.9 - 10.3 mg/dL 9.4    Elizabeth Broome, DO 10/11/2023, 11:38 AM  PGY-1, [place] [facility] FPTS Intern pager: [phone], text pages welcome Secure chat group [facility] Teaching Service

## 2023-10-11 NOTE — TOC Progression Note (Signed)
Transition of Care [facility]) - Progression Note    Patient Details  Name: Elizabeth Schwartz MRN: [id number] Date of Birth: 1946/03/04  Transition of Care [facility]) CM/SW Contact  Lorri Frederick, LCSW Phone Number: 10/11/2023, 8:33 AM  Clinical Narrative:   CSW left message with APS for progress update.     Expected Discharge Plan: Skilled Nursing Facility Barriers to Discharge: SNF Pending bed offer  Expected Discharge Plan and Services In-house Referral: Clinical Social Work   Post Acute Care Choice: Skilled Nursing Facility Living arrangements for the past 2 months: Single Family Home                                       Social Determinants of Health (SDOH) Interventions SDOH Screenings   Food Insecurity: No Food Insecurity (09/18/2023)  Housing: Patient Unable To Answer (09/18/2023)  Transportation Needs: No Transportation Needs (09/18/2023)  Utilities: Not At Risk (09/18/2023)  Alcohol Screen: Low Risk  (03/22/2023)  Depression (PHQ2-9): Low Risk  (07/13/2023)  Financial Resource Strain: Low Risk  (03/22/2023)  Physical Activity: Insufficiently Active (03/22/2023)  Social Connections: Socially Isolated (03/22/2023)  Stress: No Stress Concern Present (03/22/2023)  Tobacco Use: Medium Risk (10/04/2023)    Readmission Risk Interventions     No data to display

## 2023-10-11 NOTE — Assessment & Plan Note (Addendum)
 Early acute osteomyelitis found on CT. Treatment plan as above.

## 2023-10-11 NOTE — Assessment & Plan Note (Addendum)
2/2 crush injury now s/p I&D on 11/11 and additional debridement on 11/20. Pain is managed. Disposition pending APS establishing protective order to assist with pt placement at SNF.  -Plastic surgery following, scheduled for additional wound evaluation 10/17/23 -ID following, appreciate recommendations -Continue Meropenem, Daptomycin via PICC -Pain management: Scheduled tylenol 650mg  q6h

## 2023-10-11 NOTE — Progress Notes (Signed)
Mobility Specialist: Progress Note   10/11/23 1200  Mobility  Activity Transferred from chair to bed  Level of Assistance Minimal assist, patient does 75% or more  Assistive Device Front wheel walker  Range of Motion/Exercises Right leg;Left leg;Active Assistive;Passive  Activity Response Tolerated well  Mobility Referral Yes  $Mobility charge 1 Mobility  Mobility Specialist Start Time (ACUTE ONLY) 1137  Mobility Specialist Stop Time (ACUTE ONLY) 1155  Mobility Specialist Time Calculation (min) (ACUTE ONLY) 18 min    Pre-Mobility: BP 154/64  Pt was agreeable to mobility session with encouragement from RN - received in bed. MS assisted with bed level exercises d/t pt c/o pain. Performed leg lifts and lying marches (5x each leg) but stated she couldn't tolerate more. Eventually agreed to transfer to chair. CG for bed mobility, MinA for STS and ambulation (~67ft). Left in chair with all needs met, call bell in reach. RN aware.   Maurene Capes Mobility Specialist Please contact via SecureChat or Rehab office at [phone]

## 2023-10-12 DIAGNOSIS — Z7984 Long term (current) use of oral hypoglycemic drugs: Secondary | ICD-10-CM

## 2023-10-12 DIAGNOSIS — F0390 Unspecified dementia without behavioral disturbance: Secondary | ICD-10-CM | POA: Diagnosis not present

## 2023-10-12 DIAGNOSIS — M6080 Other myositis, unspecified site: Secondary | ICD-10-CM | POA: Diagnosis not present

## 2023-10-12 DIAGNOSIS — L89224 Pressure ulcer of left hip, stage 4: Secondary | ICD-10-CM | POA: Diagnosis not present

## 2023-10-12 NOTE — Progress Notes (Signed)
     Daily Progress Note Intern Pager: [phone]  Patient name: Elizabeth Schwartz Medical record number: [id number] Date of birth: 1946/05/23 Age: 77 y.o. Gender: female  Primary Care Provider: Storm Frisk, MD Consultants: ID, Plastics Code Status: Full  Pt Overview and Major Events to Date:  11/3: Admitted to FMTS 11/11: I&D L thigh 11/17: Meropenem started 11/20: left thigh wound evaluation under anesthesia w/ debridement 11/24: Daptomycin added due to MRSA in deep wound culture 11/26: PICC placed    Assessment and Plan: Elizabeth Schwartz is a 77yo female with a PMH of Schizophrenia, Dementia, COPD, Osteoporosis, and T2DM. She is admitted for a left thigh wound after a crush injury. Found to have necrotizing myositis and early osteomyelitis. She is s/p multiple debridements with plastic surgery.  She is now awaiting SNF placement, complicated by her lack of capacity to sign herself into SNF. APS is involved and is assisting with getting her a legal guardian.   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 Pain is managed. Disposition pending APS establishing protective order to assist with pt placement at SNF.  -Plastic surgery following, scheduled for additional wound evaluation 10/17/23 -ID following, appreciate recommendations -Continue Meropenem, Daptomycin via PICC -Pain management: Scheduled tylenol 650mg  q6h -Weekly labs tomorrow  Osteomyelitis of left femur (HCC) Early acute osteomyelitis found on CT. IV abx treatment plan as above.   FEN/GI: Regular PPx: Lovenox Dispo:SNF. Barriers include getting her a guardian to sign her in.   Subjective:  Sleeping heavily when I first arrived. Awakens easily. No concerns.   Objective: Temp:  [97.7 F (36.5 C)-98.5 F (36.9 C)] 98.3 F (36.8 C) (11/28 0346) Pulse Rate:  [82-95] 82 (11/28 0346) Resp:  [16-18] 18 (11/28 0346) BP: (112-136)/(50-64) 128/60  (11/28 0346) SpO2:  [94 %-97 %] 96 % (11/28 0346) Physical Exam: General: Well appearing, a bit confused Cardiovascular: RRR Respiratory: Normal WOB on RA, lungs clear anteriorly Extremities: LLE with clean dressing over wound site, edema distally without tenderness, erythema, warmth, or streaking  Laboratory: Most recent CBC Lab Results  Component Value Date   WBC 5.6 10/06/2023   HGB 8.0 (L) 10/06/2023   HCT 25.8 (L) 10/06/2023   MCV 91.5 10/06/2023   PLT 365 10/06/2023   Most recent BMP    Latest Ref Rng & Units 10/02/2023    8:05 AM  BMP  Glucose 70 - 99 mg/dL 034   BUN 8 - 23 mg/dL 15   Creatinine 7.42 - 1.00 mg/dL 5.95   Sodium 638 - 756 mmol/L 143   Potassium 3.5 - 5.1 mmol/L 3.7   Chloride 98 - 111 mmol/L 112   CO2 22 - 32 mmol/L 24   Calcium 8.9 - 10.3 mg/dL 9.4      Imaging/Diagnostic Tests: No new imaging, tests Elizabeth Amel, MD 10/12/2023, 4:30 AM  PGY-3, [place] [facility] FPTS Intern pager: [phone], text pages welcome Secure chat group [facility] Teaching Service

## 2023-10-12 NOTE — Progress Notes (Signed)
Mobility Specialist Progress Note:   10/12/23 0916  Mobility  Activity Ambulated with assistance in room;Transferred to/from [facility]  Level of Assistance Minimal assist, patient does 75% or more  Assistive Device Front wheel walker;BSC  Distance Ambulated (ft) 20 ft  Activity Response Tolerated fair  Mobility Referral Yes  $Mobility charge 1 Mobility  Mobility Specialist Start Time (ACUTE ONLY) [id number]  Mobility Specialist Stop Time (ACUTE ONLY) 0930  Mobility Specialist Time Calculation (min) (ACUTE ONLY) 14 min   Pt agreeable to mobility session with encouragement. Required MinA to get EOB, minG to ambulate in room. Requested to use BSC prior to getting back in bed. Left with all needs met, alarm on.   Addison Lank Mobility Specialist Please contact via SecureChat or  Rehab office at [phone]

## 2023-10-12 NOTE — Plan of Care (Signed)
Problem: Nutritional: Goal: Maintenance of adequate nutrition will improve Outcome: Progressing   Problem: Skin Integrity: Goal: Risk for impaired skin integrity will decrease Outcome: Progressing   Problem: Tissue Perfusion: Goal: Adequacy of tissue perfusion will improve Outcome: Progressing

## 2023-10-12 NOTE — Assessment & Plan Note (Signed)
Early acute osteomyelitis found on CT. IV abx treatment plan as above.

## 2023-10-12 NOTE — Assessment & Plan Note (Signed)
2/2 crush injury now s/p I&D on 11/11 and additional debridement on 11/20. Pain is managed. Disposition pending APS establishing protective order to assist with pt placement at SNF.  -Plastic surgery following, scheduled for additional wound evaluation 10/17/23 -ID following, appreciate recommendations -Continue Meropenem, Daptomycin via PICC -Pain management: Scheduled tylenol 650mg  q6h -Weekly labs tomorrow

## 2023-10-13 DIAGNOSIS — Z7984 Long term (current) use of oral hypoglycemic drugs: Secondary | ICD-10-CM | POA: Diagnosis not present

## 2023-10-13 DIAGNOSIS — F0390 Unspecified dementia without behavioral disturbance: Secondary | ICD-10-CM | POA: Diagnosis not present

## 2023-10-13 DIAGNOSIS — M6080 Other myositis, unspecified site: Secondary | ICD-10-CM | POA: Diagnosis not present

## 2023-10-13 LAB — BASIC METABOLIC PANEL
Anion gap: 8 (ref 5–15)
BUN: 25 mg/dL — ABNORMAL HIGH (ref 8–23)
CO2: 23 mmol/L (ref 22–32)
Calcium: 9.1 mg/dL (ref 8.9–10.3)
Chloride: 110 mmol/L (ref 98–111)
Creatinine, Ser: 0.62 mg/dL (ref 0.44–1.00)
GFR, Estimated: >60 mL/min (ref >60)
Glucose, Bld: 100 mg/dL — ABNORMAL HIGH (ref 70–99)
Potassium: 3.7 mmol/L (ref 3.5–5.1)
Sodium: 141 mmol/L (ref 135–145)

## 2023-10-13 LAB — CBC
HCT: 25.4 % — ABNORMAL LOW (ref 36.0–46.0)
Hemoglobin: 7.8 g/dL — ABNORMAL LOW (ref 12.0–15.0)
MCH: 28.4 pg (ref 26.0–34.0)
MCHC: 30.7 g/dL (ref 30.0–36.0)
MCV: 92.4 fL (ref 80.0–100.0)
Platelets: 365 10*3/uL (ref 150–400)
RBC: 2.75 MIL/uL — ABNORMAL LOW (ref 3.87–5.11)
RDW: 19.5 % — ABNORMAL HIGH (ref 11.5–15.5)
WBC: 5.4 10*3/uL (ref 4.0–10.5)
nRBC: 0 % (ref 0.0–0.2)

## 2023-10-13 LAB — CK: Total CK: 22 U/L — ABNORMAL LOW (ref 38–234)

## 2023-10-13 MED ORDER — OXYCODONE HCL 5 MG PO TABS
5.0000 mg | ORAL_TABLET | ORAL | Status: DC | PRN
  Administered 2023-10-13 – 2023-10-14 (×2): 5 mg via ORAL
  Filled 2023-10-13 (×2): qty 1

## 2023-10-13 MED ORDER — OXYCODONE HCL 5 MG PO TABS
2.5000 mg | ORAL_TABLET | ORAL | Status: DC | PRN
  Administered 2023-10-18 – 2023-10-21 (×6): 2.5 mg via ORAL
  Filled 2023-10-13 (×6): qty 1

## 2023-10-13 NOTE — Plan of Care (Signed)
  Problem: Nutritional: Goal: Maintenance of adequate nutrition will improve Outcome: Progressing   Problem: Skin Integrity: Goal: Risk for impaired skin integrity will decrease Outcome: Progressing   Problem: Activity: Goal: Risk for activity intolerance will decrease Outcome: Progressing

## 2023-10-13 NOTE — Progress Notes (Signed)
Occupational Therapy Treatment Patient Details Name: Elizabeth Schwartz MRN: [id number] DOB: 25-Nov-1945 Today's Date: 10/13/2023   History of present illness Pt is a 77 y/o F admitted on 09/17/23 after being advised by [facility] nurse to go to the ED after assessing pt's wound. CT showed necrotic myositis, pt also found to have acute osteomyelitis. Pt found to have symptomatic orthostatic hypotension during therapies 11/6-11/8, and consistently following week.  S/P I&D 11/11 and 11/20. Possible surgergy again 12/3.  PMH: anemia, COPD, dementia, depression, DM, HLD, HTN, osteoporosis, schizo-affective, SOB, syncope.   OT comments  Patient supine in bed and agreeable to OT session.  Patient remains limited by pain in L thigh, but overall mobilizing with min guard assist today.  Used RW for transfers and in room mobility.  She requires up to min assist for ADLs, specifically LB dressing.  Educated pt and RN on only wearing L ankle brace and abdominal binder when OOB, as pt found with L ankle brace on and reports discomfort with it (left off during session). BP stable today, but pt reports feeling weak.  Will update goals next session, she is limited by cognition and will need 24/7 support at dc.        If plan is discharge home, recommend the following:  A little help with walking and/or transfers;Assistance with cooking/housework;Direct supervision/assist for financial management;Direct supervision/assist for medications management;Assist for transportation;Help with stairs or ramp for entrance;A little help with bathing/dressing/bathroom   Equipment Recommendations  Other (comment) (defer)    Recommendations for Other Services      Precautions / Restrictions Precautions Precautions: Fall Precaution Comments: Contact; watch BP/HR Required Braces or Orthoses: Other Brace Other Brace: LLE soft AFO "foot up" brace; knee high TED hose and abdominal binder Restrictions Weight Bearing Restrictions: No        Mobility Bed Mobility Overal bed mobility: Needs Assistance Bed Mobility: Supine to Sit     Supine to sit: Contact guard, Used rails     General bed mobility comments: min guard for safety, increased time and cueing to fully come to EOB    Transfers Overall transfer level: Needs assistance Equipment used: Rolling walker (2 wheels) Transfers: Sit to/from Stand Sit to Stand: Contact guard assist           General transfer comment: min guard for hand placement and safety, increased time but able to ascend with steadying assist only from EOB and BSC     Balance Overall balance assessment: Needs assistance Sitting-balance support: Feet supported, Single extremity supported, No upper extremity supported Sitting balance-Leahy Scale: Good Sitting balance - Comments: statically with supervision   Standing balance support: Bilateral upper extremity supported, Single extremity supported, During functional activity Standing balance-Leahy Scale: Poor Standing balance comment: relies on BUE support dynamically but able to engage in ADLs with 1 UE support and min guard                           ADL either performed or assessed with clinical judgement   ADL Overall ADL's : Needs assistance/impaired     Grooming: Set up;Brushing hair;Sitting           Upper Body Dressing : Set up;Sitting   Lower Body Dressing: Minimal assistance;Sit to/from stand Lower Body Dressing Details (indicate cue type and reason): assist for L sock, min guard assist in standing Toilet Transfer: Contact guard assist;Ambulation;Rolling walker (2 wheels) Toilet Transfer Details (indicate cue type and reason): ambulating  to [facility] Toileting- Clothing Manipulation and Hygiene: Contact guard assist;Sit to/from stand       Functional mobility during ADLs: Contact guard assist;Rolling walker (2 wheels);Cueing for sequencing;Cueing for safety      Extremity/Trunk Assessment               Vision       Perception     Praxis      Cognition Arousal: Alert Behavior During Therapy: Flat affect Overall Cognitive Status: History of cognitive impairments - at baseline                                 General Comments: pt oriented and follows simple commands; continues to have difficulty with recall of hand placement.  Noted hx of dementia and unable to recall 0/3 words immediately today.        Exercises      Shoulder Instructions       General Comments BP stable during session but pt reports feeling "weak"    Pertinent Vitals/ Pain       Pain Assessment Pain Assessment: Faces Faces Pain Scale: Hurts even more Pain Location: L thigh Pain Descriptors / Indicators: Grimacing, Discomfort, Guarding Pain Intervention(s): Limited activity within patient's tolerance, Monitored during session, Repositioned  Home Living                                          Prior Functioning/Environment              Frequency  Min 1X/week        Progress Toward Goals  OT Goals(current goals can now be found in the care plan section)  Progress towards OT goals: Progressing toward goals  Acute Rehab OT Goals Patient Stated Goal: rehab OT Goal Formulation: With patient Time For Goal Achievement: 10/26/23 Potential to Achieve Goals: Good  Plan      Co-evaluation                 AM-PAC OT "6 Clicks" Daily Activity     Outcome Measure   Help from another person eating meals?: A Little Help from another person taking care of personal grooming?: A Little Help from another person toileting, which includes using toliet, bedpan, or urinal?: A Little Help from another person bathing (including washing, rinsing, drying)?: A Little Help from another person to put on and taking off regular upper body clothing?: A Little Help from another person to put on and taking off regular lower body clothing?: A Little 6 Click Score: 18     End of Session Equipment Utilized During Treatment: Gait belt;Rolling walker (2 wheels)  OT Visit Diagnosis: Other abnormalities of gait and mobility (R26.89);Muscle weakness (generalized) (M62.81);Pain;Other symptoms and signs involving cognitive function Pain - Right/Left: Left Pain - part of body: Leg   Activity Tolerance Patient tolerated treatment well   Patient Left in chair;with call bell/phone within reach;with chair alarm set   Nurse Communication Mobility status;Other (comment) (only wearing L ankle "up" brace and abd binder when OOB)        Time: 7846-9629 OT Time Calculation (min): 29 min  Charges: OT General Charges $OT Visit: 1 Visit OT Treatments $Self Care/Home Management : 23-37 mins  Barry Brunner, OT Acute Rehabilitation Services Office [phone]   Elizabeth Schwartz 10/13/2023, 12:18 PM

## 2023-10-13 NOTE — Progress Notes (Signed)
Daily Progress Note Intern Pager: [phone]  Patient name: Elizabeth Schwartz Medical record number: [id number] Date of birth: 05-05-1946 Age: 77 y.o. Gender: female  Primary Care Provider: Storm Frisk, MD Consultants: ID, Plastics Code Status: Full  Pt Overview and Major Events to Date:  11/3: Admitted to FMTS 11/11: I&D L thigh 11/17: Meropenem started 11/20: left thigh wound evaluation under anesthesia w/ debridement 11/24: Daptomycin added due to MRSA in deep wound culture 11/26: PICC placed 12/03 (scheduled): return to OR with Dr. Ladona Ridgel   Assessment and Plan: Ms. Hueston is a 77yo female with a PMH of schizophrenia, dementia, COPD, osteoporosis, T2DM admitted for a left thigh wound following crush injury. 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 Now s/p multiple debridements with plastics. Awaiting SNF placement, complicated by her lack of capacity to sign herself into SNF. APS is involved and assisting with getting her a legal guardian.   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 AM labs stable. Wound appears to be granulating well on exam. Pain generally well controlled but asking for something for breakthrough pain. Disposition pending APS establishing protective order to assist with pt placement at SNF.  -Plastic surgery following, scheduled for additional wound evaluation in OR 10/17/23 -ID following, appreciate recommendations -Continue Meropenem, Daptomycin via PICC -Pain management: Scheduled tylenol 650mg  q6h, PRN oxycodone 2.5-5mg  q4h  -Weekly labs on Fridays  Osteomyelitis of left femur (HCC) Early acute osteomyelitis found on CT. IV abx treatment plan as above.    FEN/GI: Regular PPx: Lovenox Dispo:SNF pending APS guardianship hurdles  Subjective:  Much more awake and alert today compared to my exam yesterday when she was quite sleepy.  Tells  me that she has been having some pain that is about a 6 out of 10.  Not controlled with the scheduled Tylenol she has been getting.  Requesting something for breakthrough pain.  Wound dressing being changed at the time my assessment and visualized.  Appears to be granulating well.  See photos below.  Objective: Temp:  [98.1 F (36.7 C)-99.1 F (37.3 C)] 98.1 F (36.7 C) (11/29 0524) Pulse Rate:  [78-102] 88 (11/29 0524) Resp:  [16-18] 18 (11/29 0524) BP: (129-165)/(61-68) 165/67 (11/29 0524) SpO2:  [93 %-98 %] 98 % (11/29 0524) FiO2 (%):  [94 %] 94 % (11/28 2031) Physical Exam: General: In good spirits, NAD Cardiovascular: RRR Respiratory: Normal WOB on RA, lungs clear anteriorly Abdomen: Soft, non-tender, non-distended  Extremities: Left thigh wound appears to be granulating/filling in when compared to Dr. Lubertha Basque photos from last week. See photo below.     Laboratory: Most recent CBC Lab Results  Component Value Date   WBC 5.4 10/13/2023   HGB 7.8 (L) 10/13/2023   HCT 25.4 (L) 10/13/2023   MCV 92.4 10/13/2023   PLT 365 10/13/2023   Most recent BMP    Latest Ref Rng & Units 10/13/2023    3:35 AM  BMP  Glucose 70 - 99 mg/dL 440   BUN 8 - 23 mg/dL 25   Creatinine 1.02 - 1.00 mg/dL 7.25   Sodium 366 - 440 mmol/L 141   Potassium 3.5 - 5.1 mmol/L 3.7   Chloride 98 - 111 mmol/L 110   CO2 22 - 32 mmol/L 23   Calcium 8.9 - 10.3 mg/dL 9.1      Imaging/Diagnostic Tests: No new imaging, tests   Alicia Amel, MD  10/13/2023, 5:49 AM  PGY-3, [facility] FPTS Intern pager: [phone], text pages welcome Secure chat group [facility] Teaching Service

## 2023-10-13 NOTE — Assessment & Plan Note (Signed)
 Early acute osteomyelitis found on CT. IV abx treatment plan as above.

## 2023-10-13 NOTE — Assessment & Plan Note (Signed)
2/2 crush injury now s/p I&D on 11/11 and additional debridement on 11/20. AM labs stable. Wound appears to be granulating well on exam. Pain generally well controlled but asking for something for breakthrough pain. Disposition pending APS establishing protective order to assist with pt placement at SNF.  -Plastic surgery following, scheduled for additional wound evaluation in OR 10/17/23 -ID following, appreciate recommendations -Continue Meropenem, Daptomycin via PICC -Pain management: Scheduled tylenol 650mg  q6h, PRN oxycodone 2.5-5mg  q4h  -Weekly labs on Fridays

## 2023-10-13 NOTE — Plan of Care (Signed)
  Problem: Education: Goal: Ability to describe self-care measures that may prevent or decrease complications (Diabetes Survival Skills Education) will improve Outcome: Progressing   

## 2023-10-13 NOTE — Progress Notes (Signed)
Physical Therapy Treatment Patient Details Name: Elizabeth Schwartz MRN: [id number] DOB: May 13, 1946 Today's Date: 10/13/2023   History of Present Illness Pt is a 77 y/o F admitted on 09/17/23 after being advised by [facility] nurse to go to the ED after assessing pt's wound. CT showed necrotic myositis, pt also found to have acute osteomyelitis. Pt found to have symptomatic orthostatic hypotension during therapies 11/6-11/8, and consistently following week.  S/P I&D 11/11 and 11/20. Possible surgergy again 12/3. PMH: anemia, COPD, dementia, depression, DM, HLD, HTN, osteoporosis, schizo-affective, SOB, syncope.    PT Comments  Pt received in recliner with feet over side of chair, pt requesting assist to bring tray table closer as her food just arrived. PTA reinforced safety and use of call bell when she needs assist, and pt agreeable to limited session for transfer training and safety/falls risk education. Pt defers gait due to dizziness in standing posture and wanting to eat prior to return to bed, so soiled linens removed from bed and NT notified to change and pt set up to eat lunch from tray, RN/dietary notified of pt's food preferences. MD notified of pt persistent orthostatic symptoms despite binders/LE compression in place. Pt will continue to benefit from skilled rehab in a post acute setting <3 hours/day to maximize functional gains before returning home.     If plan is discharge home, recommend the following: A little help with bathing/dressing/bathroom;Assistance with cooking/housework;Supervision due to cognitive status;Direct supervision/assist for financial management;Assist for transportation;Help with stairs or ramp for entrance;Direct supervision/assist for medications management;A lot of help with walking and/or transfers   Can travel by private vehicle     Yes (wheelchair Zenaida Niece would be easier)  Equipment Recommendations  Rolling walker (2 wheels);BSC/3in1;Other (comment) (consider wheelchair  given persistent orthostatic hypotension)    Recommendations for Other Services       Precautions / Restrictions Precautions Precautions: Fall Precaution Comments: Contact; watch BP/HR Required Braces or Orthoses: Other Brace Other Brace: LLE soft AFO "foot up" brace; knee high TED hose and abdominal binder Restrictions Weight Bearing Restrictions: No     Mobility  Bed Mobility Overal bed mobility: Needs Assistance             General bed mobility comments: pt received in chair    Transfers Overall transfer level: Needs assistance Equipment used: Rolling walker (2 wheels) Transfers: Sit to/from Stand Sit to Stand: Min assist, Contact guard assist   Step pivot transfers: Min assist       General transfer comment: from chair>RW and RW> chair, a couple forward/backward steps to simulate step pivot transfer but pt defers need for tf to [facility] or bed and wanting to eat lunch. Pt endorses lightheadedness standing despite abd binder and RLE TED hose donned. Pillow placed under her hips in chair for improved comfort. STS x1 only as pt food arriving and pt wanting to eat    Ambulation/Gait               General Gait Details: orthostatic symptoms standing and pt requesting to eat lunch which had just arrived   Stairs             Wheelchair Mobility     Tilt Bed    Modified Rankin (Stroke Patients Only)       Balance Overall balance assessment: Needs assistance Sitting-balance support: Feet supported, Single extremity supported, No upper extremity supported Sitting balance-Leahy Scale: Good Sitting balance - Comments: statically with supervision   Standing balance support: Bilateral upper extremity  supported, Single extremity supported, During functional activity Standing balance-Leahy Scale: Poor Standing balance comment: relies on BUE support dynamically but able to engage in ADLs with 1 UE support and min guard                             Cognition Arousal: Alert Behavior During Therapy: Flat affect Overall Cognitive Status: History of cognitive impairments - at baseline                                 General Comments: pt oriented and follows simple commands; continues to have difficulty with recall of hand placement, hx of dementia.        Exercises      General Comments General comments (skin integrity, edema, etc.): Pt states "I feel woozy" despite binder/TED hose, did not assess BP as pt wanting to eat and defers ambulation. MD Jennette Kettle notified as she arrived after session that pt still having orthostatic symptoms in standing.      Pertinent Vitals/Pain Pain Assessment Pain Assessment: Faces Faces Pain Scale: Hurts little more Pain Location: LLE and chronic L shoulder pain Pain Descriptors / Indicators: Grimacing, Discomfort Pain Intervention(s): Limited activity within patient's tolerance, Monitored during session, Repositioned     PT Goals (current goals can now be found in the care plan section) Acute Rehab PT Goals Patient Stated Goal: none stated PT Goal Formulation: With patient Time For Goal Achievement: 10/13/23 Progress towards PT goals: Progressing toward goals    Frequency    Min 1X/week      PT Plan         AM-PAC PT "6 Clicks" Mobility   Outcome Measure  Help needed turning from your back to your side while in a flat bed without using bedrails?: A Little Help needed moving from lying on your back to sitting on the side of a flat bed without using bedrails?: A Lot Help needed moving to and from a bed to a chair (including a wheelchair)?: A Little Help needed standing up from a chair using your arms (e.g., wheelchair or bedside chair)?: A Little Help needed to walk in hospital room?: A Lot Help needed climbing 3-5 steps with a railing? : Total 6 Click Score: 14    End of Session Equipment Utilized During Treatment: Gait belt Activity Tolerance: Treatment limited  secondary to medical complications (Comment);Other (comment);Patient limited by fatigue (dizziness in standing) Patient left: in chair;with call bell/phone within reach;with chair alarm set;Other (comment) (reclined, set up to eat her lunch per pt request) Nurse Communication: Mobility status;Other (comment) (dizziness despite abd binder and RLE TED hose) PT Visit Diagnosis: Muscle weakness (generalized) (M62.81);Pain;Other abnormalities of gait and mobility (R26.89);Difficulty in walking, not elsewhere classified (R26.2) Pain - Right/Left: Left Pain - part of body: Leg;Shoulder     Time: 1610-9604 PT Time Calculation (min) (ACUTE ONLY): 13 min  Charges:    $Therapeutic Activity: 8-22 mins PT General Charges $$ ACUTE PT VISIT: 1 Visit                     Shadoe Bethel P., PTA Acute Rehabilitation Services Secure Chat Preferred 9a-5:30pm Office: [phone]    [place] 10/13/2023, 1:55 PM

## 2023-10-14 DIAGNOSIS — M6080 Other myositis, unspecified site: Secondary | ICD-10-CM | POA: Diagnosis not present

## 2023-10-14 MED ORDER — GENTAMICIN SULFATE 0.1 % EX OINT
TOPICAL_OINTMENT | Freq: Two times a day (BID) | CUTANEOUS | Status: DC
  Filled 2023-10-14 (×2): qty 15

## 2023-10-14 MED ORDER — FERROUS SULFATE 325 (65 FE) MG PO TABS
325.0000 mg | ORAL_TABLET | ORAL | Status: DC
  Administered 2023-10-14 – 2023-11-01 (×10): 325 mg via ORAL
  Filled 2023-10-14 (×12): qty 1

## 2023-10-14 NOTE — Assessment & Plan Note (Signed)
2/2 crush injury now s/p I&D on 11/11 and additional debridement on 11/20. AM labs stable. Wound appears to be granulating well on exam but remains with green discharge and the smell of Pseudomonas.  Discussed with both infectious diseases and plastic surgery yesterday, will try adding on some topical gentamicin.  Discussed best mechanism of delivery with pharmacy overnight, we will plan to add gentamicin ointment twice daily with dressing changes.  She is also due for a repeat trip to the OR on 12/3.Marland Kitchen  Disposition pending APS establishing protective order to assist with pt placement at SNF.  -Plastic surgery following, scheduled for additional wound evaluation in OR 10/17/23 -ID following, appreciate recommendations -Continue Meropenem, Daptomycin via PICC -Adding gentamicin ointment twice daily with dressing changes for topical coverage -Pain management: Scheduled tylenol 650mg  q6h, PRN oxycodone 2.5-5mg  q4h  -Weekly labs on Fridays

## 2023-10-14 NOTE — Plan of Care (Signed)
Evaluated patient at bedside with Dr. Velna Ochs.  She is awake and alert, watching TV.  Able to tell us about her Thanksgiving and knows it's the weekend.  No concerns at this time.  States that her pain that she was having overnight improved with the pain medicine.

## 2023-10-14 NOTE — Plan of Care (Signed)
  Problem: Tissue Perfusion: Goal: Adequacy of tissue perfusion will improve Outcome: Progressing   Problem: Clinical Measurements: Goal: Ability to maintain clinical measurements within normal limits will improve Outcome: Progressing   Problem: Activity: Goal: Risk for activity intolerance will decrease Outcome: Progressing   Problem: Nutrition: Goal: Adequate nutrition will be maintained Outcome: Progressing   Problem: Elimination: Goal: Will not experience complications related to bowel motility Outcome: Progressing Goal: Will not experience complications related to urinary retention Outcome: Progressing   Problem: Activity: Goal: Risk for activity intolerance will decrease Outcome: Progressing   Problem: Clinical Measurements: Goal: Ability to maintain clinical measurements within normal limits will improve Outcome: Progressing   Problem: Nutrition: Goal: Adequate nutrition will be maintained Outcome: Progressing   Problem: Elimination: Goal: Will not experience complications related to bowel motility Outcome: Progressing Goal: Will not experience complications related to urinary retention Outcome: Progressing   Problem: Pain Management: Goal: General experience of comfort will improve Outcome: Progressing   Problem: Safety: Goal: Ability to remain free from injury will improve Outcome: Progressing   Problem: Skin Integrity: Goal: Risk for impaired skin integrity will decrease Outcome: Progressing

## 2023-10-14 NOTE — Progress Notes (Addendum)
   10/14/23 1428  Mobility  Activity Transferred to/from [facility]  Level of Assistance Minimal assist, patient does 75% or more  Assistive Device Front wheel walker  Activity Response Tolerated well  Mobility Referral Yes  $Mobility charge 1 Mobility  Mobility Specialist Start Time (ACUTE ONLY) 1412  Mobility Specialist Stop Time (ACUTE ONLY) 1428  Mobility Specialist Time Calculation (min) (ACUTE ONLY) 16 min   Mobility Specialist: Progress Note  Pt agreeable to mobility session - received in chair. Required MinA throughout using RW. C/o "needing to have a BM." Left on BSC with all needs met - call bell within reach - given instructions to press call bell button when finished. RN notified.  Barnie Mort, BS Mobility Specialist Please contact via SecureChat or Rehab office at [phone].

## 2023-10-14 NOTE — Assessment & Plan Note (Signed)
 Early acute osteomyelitis found on CT. IV abx treatment plan as above.

## 2023-10-14 NOTE — Progress Notes (Signed)
Daily Progress Note Intern Pager: [phone]  Patient name: Elizabeth Schwartz Medical record number: [id number] Date of birth: 09-Jul-1946 Age: 77 y.o. Gender: female  Primary Care Provider: Storm Frisk, MD Consultants: Plastics, ID Code Status: Full  Pt Overview and Major Events to Date:  11/3: Admitted to FMTS 11/11: I&D L thigh 11/17: Meropenem started 11/20: left thigh wound evaluation under anesthesia w/ debridement 11/24: Daptomycin added due to MRSA in deep wound culture 11/26: PICC placed 12/03 (scheduled): return to OR with Dr. Ladona Ridgel     Assessment and Plan: Ms. Penick is a 77 year old female with a past medical history of schizophrenia, dementia, COPD, osteoporosis who is admitted for left thigh wound following a crush injury.  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  She is now status post multiple debridements with plastics.  We are having a bit of a hard time getting her Pseudomonas wound infection under control.  We are also awaiting ultimate transfer to SNF but this is complicated by her lack of capacity to sign herself into SNF.  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 AM labs stable. Wound appears to be granulating well on exam but remains with green discharge and the smell of Pseudomonas.  Discussed with both infectious diseases and plastic surgery yesterday, will try adding on some topical gentamicin.  Discussed best mechanism of delivery with pharmacy overnight, we will plan to add gentamicin ointment twice daily with dressing changes.  She is also due for a repeat trip to the OR on 12/3.Marland Kitchen  Disposition pending APS establishing protective order to assist with pt placement at SNF.  -Plastic surgery following, scheduled for additional wound evaluation in OR 10/17/23 -ID following, appreciate  recommendations -Continue Meropenem, Daptomycin via PICC -Adding gentamicin ointment twice daily with dressing changes for topical coverage -Pain management: Scheduled tylenol 650mg  q6h, PRN oxycodone 2.5-5mg  q4h  -Weekly labs on Fridays  Osteomyelitis of left femur (HCC) Early acute osteomyelitis found on CT. IV abx treatment plan as above.   FEN/GI: Regular PPx: Lovenox Dispo: SNF pending APS guardianship hurdles  Subjective:  Awake and in good spirits this morning.  Tells me that she got a dose of her as needed oxycodone last night which helped her to sleep better last night than previous nights.  She is pleased with this arrangement.  Discussed that we would be adding topical gentamicin with her dressing changes today.  Objective: Temp:  [98 F (36.7 C)-98.9 F (37.2 C)] 98.2 F (36.8 C) (11/30 0530) Pulse Rate:  [81-94] 83 (11/30 0530) Resp:  [16-18] 18 (11/30 0530) BP: (119-139)/(49-66) 137/66 (11/30 0530) SpO2:  [93 %-97 %] 97 % (11/30 0530) Physical Exam: General: In good spirits, NAD Cardiovascular: Regular rate and rhythm Respiratory: Normal work of breathing on room air, lungs are clear anteriorly Abdomen: Soft and nontender Extremities: Left lower extremity wound is dressed, did not take down dressing today but it is clean dry and intact  Laboratory: Most recent CBC Lab Results  Component Value Date   WBC 5.4 10/13/2023   HGB 7.8 (L) 10/13/2023   HCT 25.4 (L) 10/13/2023   MCV 92.4 10/13/2023   PLT 365 10/13/2023   Most recent BMP    Latest Ref Rng & Units 10/13/2023    3:35 AM  BMP  Glucose 70 - 99 mg/dL 952  BUN 8 - 23 mg/dL 25   Creatinine 1.61 - 1.00 mg/dL 0.96   Sodium 045 - 409 mmol/L 141   Potassium 3.5 - 5.1 mmol/L 3.7   Chloride 98 - 111 mmol/L 110   CO2 22 - 32 mmol/L 23   Calcium 8.9 - 10.3 mg/dL 9.1     Imaging/Diagnostic Tests: No new imaging, tests  Alicia Amel, MD 10/14/2023, 6:08 AM  PGY-3, [facility] FPTS Intern pager: [phone], text pages welcome Secure chat group [facility] Teaching Service

## 2023-10-14 NOTE — Plan of Care (Signed)
  Problem: Nutritional: Goal: Maintenance of adequate nutrition will improve Outcome: Progressing   Problem: Skin Integrity: Goal: Risk for impaired skin integrity will decrease Outcome: Progressing   Problem: Clinical Measurements: Goal: Ability to maintain clinical measurements within normal limits will improve Outcome: Progressing Goal: Will remain free from infection Outcome: Progressing   Problem: Activity: Goal: Risk for activity intolerance will decrease Outcome: Progressing   Problem: Nutrition: Goal: Adequate nutrition will be maintained Outcome: Progressing   Problem: Pain Management: Goal: General experience of comfort will improve Outcome: Progressing   Problem: Safety: Goal: Ability to remain free from injury will improve Outcome: Progressing   Problem: Skin Integrity: Goal: Risk for impaired skin integrity will decrease Outcome: Progressing

## 2023-10-14 NOTE — Progress Notes (Signed)
   10/14/23 1048  Mobility  Activity Transferred from bed to chair  Level of Assistance Minimal assist, patient does 75% or more  Assistive Device Front wheel walker  Activity Response Tolerated well  Mobility Referral Yes  $Mobility charge 1 Mobility  Mobility Specialist Start Time (ACUTE ONLY) 1029  Mobility Specialist Stop Time (ACUTE ONLY) 1048  Mobility Specialist Time Calculation (min) (ACUTE ONLY) 19 min   Mobility Specialist: Progress Note  Pt agreeable to mobility session - received in bed. Required MinA throughout using RW. Pt was asymptomatic throughout session with no complaints. Returned to chair with all needs met - call bell within reach. Chair alarm on.   Barnie Mort, BS Mobility Specialist Please contact via SecureChat or Rehab office at [phone].

## 2023-10-15 DIAGNOSIS — M609 Myositis, unspecified: Secondary | ICD-10-CM

## 2023-10-15 DIAGNOSIS — L89224 Pressure ulcer of left hip, stage 4: Secondary | ICD-10-CM | POA: Diagnosis not present

## 2023-10-15 DIAGNOSIS — M6080 Other myositis, unspecified site: Secondary | ICD-10-CM | POA: Diagnosis not present

## 2023-10-15 LAB — MISC LABCORP TEST (SEND OUT)
LabCorp test name: 96388
Labcorp test code: 96388

## 2023-10-15 LAB — CK: Total CK: 22 U/L — ABNORMAL LOW (ref 38–234)

## 2023-10-15 LAB — GLUCOSE, CAPILLARY: Glucose-Capillary: 105 mg/dL — ABNORMAL HIGH (ref 70–99)

## 2023-10-15 NOTE — Progress Notes (Signed)
     Daily Progress Note Intern Pager: [phone]  Patient name: Elizabeth Schwartz Medical record number: [id number] Date of birth: 02/25/1946 Age: 77 y.o. Gender: female  Primary Care Provider: Storm Frisk, MD Consultants: Plastics, ID  Code Status: Full   Pt Overview and Major Events to Date:  11/3: Admitted to FMTS 11/11: I&D L thigh 11/17: Meropenem started 11/20: left thigh wound evaluation under anesthesia w/ debridement 11/24: Daptomycin added due to MRSA in deep wound culture 11/26: PICC placed 12/03 (scheduled): return to OR with Dr. Ladona Ridgel   Assessment and Plan: Elizabeth Schwartz is a 77 year old female with a past medical history of schizophrenia, dementia, COPD, osteoporosis who is admitted for left thigh wound following a crush injury.  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  She is now status post multiple debridements with plastics.  We are having a bit of a hard time getting her Pseudomonas wound infection under control.  We are also awaiting ultimate transfer to SNF but this is complicated by her lack of capacity to sign herself into SNF.  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  -Disposition pending APS establishing protective order to assist with pt placement at SNF.  -Plastic surgery following, scheduled for additional wound evaluation in OR 10/17/23 -ID following, appreciate recommendations -Continue Meropenem, Daptomycin via PICC -Cont gentamicin ointment twice daily with dressing changes for topical coverage -Pain management: Scheduled tylenol 650mg  q6h, PRN oxycodone 2.5-5mg  q4h  -Weekly labs on Fridays  Osteomyelitis of left femur (HCC) Early acute osteomyelitis found on CT. IV abx treatment plan as above.  FEN/GI: Regular PPx: Lovenox Dispo: SNF pending APS guardianship  hurdles  Subjective:  NAEO.  Easily awakens to voice.  Objective: Temp:  [97.8 F (36.6 C)-98.7 F (37.1 C)] 98.6 F (37 C) (11/30 2008) Pulse Rate:  [76-83] 79 (11/30 2008) Resp:  [16-18] 18 (11/30 2008) BP: (130-149)/(58-71) 142/58 (11/30 2008) SpO2:  [93 %-97 %] 93 % (11/30 2030) Physical Exam: General: Alert, pleasantly demented older lady. NAD. HEENT: NCAT. MMM. CV: RRR Resp: CTAB, no wheezing or crackles. Normal WOB on RA.  Abm: Soft, nontender, nondistended. BS present.  Laboratory: Most recent CBC Lab Results  Component Value Date   WBC 5.4 10/13/2023   HGB 7.8 (L) 10/13/2023   HCT 25.4 (L) 10/13/2023   MCV 92.4 10/13/2023   PLT 365 10/13/2023   Most recent BMP    Latest Ref Rng & Units 10/13/2023    3:35 AM  BMP  Glucose 70 - 99 mg/dL 518   BUN 8 - 23 mg/dL 25   Creatinine 8.41 - 1.00 mg/dL 6.60   Sodium 630 - 160 mmol/L 141   Potassium 3.5 - 5.1 mmol/L 3.7   Chloride 98 - 111 mmol/L 110   CO2 22 - 32 mmol/L 23   Calcium 8.9 - 10.3 mg/dL 9.1      Elizabeth Brigham, MD 10/15/2023, 5:15 AM  PGY-2, [facility] FPTS Intern pager: [phone], text pages welcome Secure chat group [facility] Teaching Service

## 2023-10-15 NOTE — Assessment & Plan Note (Signed)
 Early acute osteomyelitis found on CT. IV abx treatment plan as above.

## 2023-10-15 NOTE — Assessment & Plan Note (Addendum)
2/2 crush injury now s/p I&D on 11/11 and additional debridement on 11/20.  -Disposition pending APS establishing protective order to assist with pt placement at SNF.  -Plastic surgery following, scheduled for additional wound evaluation in OR 10/17/23 -ID following, appreciate recommendations -Continue Meropenem, Daptomycin via PICC -Cont gentamicin ointment twice daily with dressing changes for topical coverage -Pain management: Scheduled tylenol 650mg  q6h, PRN oxycodone 2.5-5mg  q4h  -Weekly labs on Fridays

## 2023-10-15 NOTE — Progress Notes (Signed)
[facility]  Date of Admission:  09/17/2023    Principal Problem:   Pressure injury of left thigh, stage 4 (HCC) Active Problems:   Normocytic anemia   Long term current use of clozapine   Myositis of left thigh   Osteomyelitis of left femur (HCC)   Necrotizing myositis   Diabetes mellitus without complication (HCC)   Dementia (HCC)   Goals of care, counseling/discussion   Skin ulcer of left great toe (HCC)   Left leg swelling   Type 2 diabetes mellitus without complication (HCC)   Anemia          Assessment: 77 year old female with diabetes, dementia presented to ED with worsening wound to the left inner thigh with purulent drainage admitted with: #Necrotizing myositis of medial left thigh/deep tissue wound #Possible acute early osteomyelitis of mid left femur - She was admitted for fall 9/25 - 10/4, neighbors found her pinned to the ground with stove on top of her.  She had compressive injury to the left thigh with rhabdomyolysis. - This admission she underwent debridement of wound on 11/11 with cultures growing Pseudomonas, E faecalis and Bacteroides she was transition to meropenem. - Taken to the OR on 11/20 for debridement Pseudomonas aeruginosa, MRSA.  Per note there was necrotic tissue next to femur and medial aspect of wound. -PICC in place Recommendations: -Continue merrem an daptomycin -Discussed with Dr. Ladona Ridgel: he noted increased green drainage from wound.Plan on OR on Tuesday for another debridement. Please send for Cx incase PsA has become carabepenem ressitance. The utility of gentamicin for topical abx herapy is unclear. No contra-indication form ID perspective -Plan on 2-3 weeks of abx from OR  New ID team on Monday   Microbiology:   Antibiotics: Meropenem Cultures: Blood 11/3 NG   Other 11/11 PsA, E faecalis, bacteroids distanis, MRSA 11/20 OR PsA and MRSA  SUBJECTIVE:   No new complaints Interval: Afebrile  overnight..  Review of Systems: Review of Systems  All other systems reviewed and are negative.    Scheduled Meds:  acetaminophen  650 mg Oral Q6H   Or   acetaminophen  650 mg Rectal Q6H   ascorbic acid  250 mg Oral BID   Chlorhexidine Gluconate Cloth  6 each Topical Daily   clozapine  200 mg Oral QHS   donepezil  10 mg Oral QHS   enoxaparin (LOVENOX) injection  40 mg Subcutaneous Q24H   ferrous sulfate  325 mg Oral QODAY   gentamicin ointment   Topical BID   memantine  5 mg Oral BID   mometasone-formoterol  2 puff Inhalation BID   multivitamin with minerals  1 tablet Oral Daily   nutrition supplement (JUVEN)  1 packet Oral BID BM   sodium chloride flush  10 mL Intravenous Q12H   sodium chloride flush  10-40 mL Intracatheter Q12H   white petrolatum   Topical BID   Continuous Infusions:  DAPTOmycin 700 mg (10/15/23 1330)   meropenem (MERREM) IV 1 g (10/15/23 1326)   PRN Meds:.albuterol, oxyCODONE **OR** [DISCONTINUED] oxyCODONE, sodium chloride flush Allergies  Allergen Reactions   Crestor [Rosuvastatin] Other (See Comments)    Muscle aches.    Fish Allergy Other (See Comments)    OBJECTIVE: Vitals:   10/15/23 1241 10/15/23 1434 10/15/23 2019 10/15/23 2024  BP:  129/60  135/68  Pulse:  99 81 76  Resp:  16 16 18   Temp:  98 F (36.7 C)  98.5  F (36.9 C)  TempSrc:  Oral  Oral  SpO2:  96% 96% 98%  Weight: 68.9 kg     Height:       Body mass index is 25.29 kg/m.  Physical Exam Constitutional:      Appearance: Normal appearance.  HENT:     Head: Normocephalic and atraumatic.     Right Ear: Tympanic membrane normal.     Left Ear: Tympanic membrane normal.     Nose: Nose normal.     Mouth/Throat:     Mouth: Mucous membranes are moist.  Eyes:     Extraocular Movements: Extraocular movements intact.     Conjunctiva/sclera: Conjunctivae normal.     Pupils: Pupils are equal, round, and reactive to light.  Cardiovascular:     Rate and Rhythm: Normal rate and  regular rhythm.     Heart sounds: No murmur heard.    No friction rub. No gallop.  Pulmonary:     Effort: Pulmonary effort is normal.     Breath sounds: Normal breath sounds.  Abdominal:     General: Abdomen is flat.     Palpations: Abdomen is soft.  Musculoskeletal:     Comments: Left leg wound  Skin:    General: Skin is warm and dry.  Neurological:     General: No focal deficit present.     Mental Status: She is alert and oriented to person, place, and time.  Psychiatric:        Mood and Affect: Mood normal.       Lab Results Lab Results  Component Value Date   WBC 5.4 10/13/2023   HGB 7.8 (L) 10/13/2023   HCT 25.4 (L) 10/13/2023   MCV 92.4 10/13/2023   PLT 365 10/13/2023    Lab Results  Component Value Date   CREATININE 0.62 10/13/2023   BUN 25 (H) 10/13/2023   NA 141 10/13/2023   K 3.7 10/13/2023   CL 110 10/13/2023   CO2 23 10/13/2023    Lab Results  Component Value Date   ALT 15 09/20/2023   AST 17 09/20/2023   ALKPHOS 39 09/20/2023   BILITOT 0.4 09/20/2023        Danelle Earthly, MD [facility] 10/15/2023, 9:55 PM I have personally spent 52  minutes involved in face-to-face and non-face-to-face activities for this patient on the day of the visit. Professional time spent includes the following activities: Preparing to see the patient (review of tests), Obtaining and/or reviewing separately obtained history (admission/discharge record), Performing a medically appropriate examination and/or evaluation , Ordering medications/tests/procedures, referring and communicating with other health care professionals, Documenting clinical information in the EMR, Independently interpreting results (not separately reported), Communicating results to the patient/family/caregiver, Counseling and educating the patient/family/caregiver and Care coordination (not separately reported).

## 2023-10-15 NOTE — Plan of Care (Signed)
  Problem: Nutritional: Goal: Maintenance of adequate nutrition will improve Outcome: Progressing   Problem: Skin Integrity: Goal: Risk for impaired skin integrity will decrease Outcome: Progressing   Problem: Clinical Measurements: Goal: Ability to maintain clinical measurements within normal limits will improve Outcome: Progressing Goal: Will remain free from infection Outcome: Progressing   Problem: Activity: Goal: Risk for activity intolerance will decrease Outcome: Progressing   Problem: Safety: Goal: Ability to remain free from injury will improve Outcome: Progressing   Problem: Skin Integrity: Goal: Risk for impaired skin integrity will decrease Outcome: Progressing

## 2023-10-15 NOTE — Progress Notes (Signed)
Nutrition Follow-up  DOCUMENTATION CODES:   Not applicable  INTERVENTION:  Continue with current regular diet Continue with MVM Continue with -1 packet Juven BID, each packet provides 95 calories, 2.5 grams of protein (collagen), and 9.8 grams of carbohydrate (3 grams sugar); also contains 7 grams of L-arginine and L-glutamine, 300 mg vitamin C, 15 mg vitamin E, 1.2 mcg vitamin B-12, 9.5 mg zinc, 200 mg calcium, and 1.5 g  Calcium Beta-hydroxy-Beta-methylbutyrate to support wound healing Ensure Plus High Protein po BID, each supplement provides 350 kcal and 20 grams of protein.    NUTRITION DIAGNOSIS:   Increased nutrient needs related to wound healing as evidenced by estimated needs. Continues to be valid with interventions in place.     GOAL:   Patient will meet greater than or equal to 90% of their needs    MONITOR:   PO intake, Weight trends, Skin, Labs  REASON FOR ASSESSMENT:   Consult Poor PO, Wound healing  ASSESSMENT:   77 y/o female presented with left thigh wound after a crush injury last month. Imaging showed evidenced of necrotizing myositis and acute early osteomyelitis.  Recent hospitalization on 9/25-10/4/24  after a stove fell on her leg causing pressure injury of the left thigh.  She was found by neighbors and had rhabdo. Underwent SNF discharge after hospital stay and was recently discharged.  PMH: DM2, HTN, HLD, osteoporosis, COPD, anemia, schizophrenia, dementia.Admmited 11/3  Was unable to speak with pt this day. All information obtained through EMR and team;   Review of EMR revealed; She was found to have necrotizing myositis and early osteomyelitis on imaging. She is now status post multiple debridements with plastics.  Difficulty getting Pseudomonas wound infection under control. Pt is noted to have no family or close friends to make decisions for her if she cannot. Awaiting SNF placement. Documentation of meal intake revealed fair to good appetite. RN  reports pt would be acceptable to Ensure Plus High Protein po BID, each supplement provides 350 kcal and 20 grams of protein. Last recorded weight is admit weight RN reached out to and new weight pending.     Pt Overview and Major Events to Date:  11/3: Admitted to [facility] 11/11: I&D L thigh 11/17: Meropenem started 11/20: left thigh wound evaluation under anesthesia w/ debridement 11/24: Daptomycin added due to MRSA in deep wound culture 11/26: PICC placed 12/03 (scheduled): return to OR with Dr. Ladona Ridgel    Admit weight: 74.8 kg   Weight history:  09/17/23 74.8 kg  08/09/23 76.4 kg  07/13/23 74.9 kg  06/21/23 74.4 kg  03/29/23 75.3 kg  03/22/23 74.8 kg  01/19/23 75.2 kg  12/20/22 73.9 kg  11/17/22 76.2 kg      Average Meal Intake: 70-100: 87.5% intake x 4 recorded meals  Nutritionally Relevant Medications: Scheduled Meds:  ascorbic acid  250 mg Oral BID   clozapine  200 mg Oral QHS   donepezil  10 mg Oral QHS   ferrous sulfate  325 mg Oral QODAY   memantine  5 mg Oral BID   multivitamin with minerals  1 tablet Oral Daily   nutrition supplement (JUVEN)  1 packet Oral BID BM    Labs Reviewed    NUTRITION - FOCUSED PHYSICAL EXAM:  Flowsheet Row Most Recent Value  Orbital Region No depletion  Upper Arm Region No depletion  Thoracic and Lumbar Region No depletion  Buccal Region No depletion  Temple Region No depletion  Clavicle Bone Region No depletion  Clavicle  and Acromion Bone Region No depletion  Scapular Bone Region No depletion  Dorsal Hand No depletion  Patellar Region No depletion  Anterior Thigh Region No depletion  [patient expresses pain on right side with palpation of muscles in thighs and calves, per visual, appears adequate muscle stores. Adequate muscle stores on left side.]  Posterior Calf Region No depletion  [patient expresses pain on right side with palpation of muscles in thighs and calves, per visual, appears adequate muscle stores. Adequate  muscle stores on left side.]  Edema (RD Assessment) Mild  [edema 1+ on left lower extremity]  Hair Reviewed  Eyes Reviewed  Mouth Reviewed  Skin Reviewed  Nails Reviewed       Diet Order:   Diet Order             Diet regular Fluid consistency: Thin  Diet effective now                   EDUCATION NEEDS:   Not appropriate for education at this time  Skin:  Skin Assessment: Reviewed RN Assessment Skin Integrity Issues:: Incisions Incisions: left thigh and left leg incisions Other: non-pressure injury to left anterior distal thigh-moderate drainage, right toe abrasion, blanchable redness to posterior hip  Last BM:  11/30  Height:   Ht Readings from Last 1 Encounters:  09/17/23 5\' 5"  (1.651 m)    Weight:   Wt Readings from Last 1 Encounters:  10/15/23 68.9 kg    Ideal Body Weight:  59.1 kg  BMI:  Body mass index is 25.29 kg/m.  Estimated Nutritional Needs:   Kcal:  1650-1900 kcals/day  Protein:  77-89 gm/day  Fluid:  1650-1900 mL/day    Jamelle Haring RDN, LDN Clinical Dietitian  RDN pager # available on Amion

## 2023-10-16 DIAGNOSIS — F02A0 Dementia in other diseases classified elsewhere, mild, without behavioral disturbance, psychotic disturbance, mood disturbance, and anxiety: Secondary | ICD-10-CM

## 2023-10-16 DIAGNOSIS — G309 Alzheimer's disease, unspecified: Secondary | ICD-10-CM | POA: Diagnosis not present

## 2023-10-16 DIAGNOSIS — M60052 Infective myositis, left thigh: Secondary | ICD-10-CM | POA: Diagnosis not present

## 2023-10-16 DIAGNOSIS — L89224 Pressure ulcer of left hip, stage 4: Secondary | ICD-10-CM | POA: Diagnosis not present

## 2023-10-16 DIAGNOSIS — M6080 Other myositis, unspecified site: Secondary | ICD-10-CM | POA: Diagnosis not present

## 2023-10-16 MED ORDER — CHLORHEXIDINE GLUCONATE CLOTH 2 % EX PADS
6.0000 | MEDICATED_PAD | Freq: Once | CUTANEOUS | Status: AC
  Administered 2023-10-16: 6 via TOPICAL

## 2023-10-16 NOTE — Progress Notes (Signed)
    [facility]   Reason for visit: Follow up on necrotizing myositis of left thigh  Interval History: remains afebrile; no new complaints.  Plan for further operative debridement tomorrow.      Physical Exam: Constitutional:  Vitals:   10/16/23 0755 10/16/23 0838  BP: (!) 127/49   Pulse: 88   Resp: 16   Temp: 98.1 F (36.7 C)   SpO2: 93% 93%   patient appears in NAD Respiratory: Normal respiratory effort  Review of Systems: Constitutional: negative for fevers and chills  Lab Results  Component Value Date   WBC 5.4 10/13/2023   HGB 7.8 (L) 10/13/2023   HCT 25.4 (L) 10/13/2023   MCV 92.4 10/13/2023   PLT 365 10/13/2023    Lab Results  Component Value Date   CREATININE 0.62 10/13/2023   BUN 25 (H) 10/13/2023   NA 141 10/13/2023   K 3.7 10/13/2023   CL 110 10/13/2023   CO2 23 10/13/2023    Lab Results  Component Value Date   ALT 15 09/20/2023   AST 17 09/20/2023   ALKPHOS 39 09/20/2023     Microbiology: No results found for this or any previous visit (from the past 240 hour(s)).  Impression/Plan:  1. Necrotizing myositis - culture growth on 11/20 with MRSA and Pseudomonas and on daptomycin (11/25 - ) and meropenem (11/14 - ) for this.  Previous growth with Pseudomonas, Bacteroides and E faecalis and was changed to meropenem from piptazo.   Will plan on 2 further weeks of above antibiotics if no signficant findings in OR tomorrow.    2.  Picc line in place;    Culture Result: Enteroccocus and Pseudomonas, MDR  Allergies  Allergen Reactions   Crestor [Rosuvastatin] Other (See Comments)    Muscle aches.    Fish Allergy Other (See Comments)    OPAT Orders Discharge antibiotics to be given via PICC line Discharge antibiotics: daptomycin 700 mg IV daily + meropenem 1 gram every 8 hours Per pharmacy protocol yes Aim for Vancomycin trough 15-20 or AUC 400-550 (unless otherwise indicated) Duration: 3 weeks End  Date: 11/06/23  [facility] Care Per Protocol: yes  Home health RN for IV administration and teaching; PICC line care and labs.    Labs weekly while on IV antibiotics: __x CBC with differential __ BMP _x_ CMP __ CRP __ ESR __ Vancomycin trough _x_ CK  _x_ Please pull PIC at completion of IV antibiotics __ Please leave PIC in place until doctor has seen patient or been notified  Fax weekly labs to [phone]  Clinic Follow Up Appt:  12/23 a 1:30 pm

## 2023-10-16 NOTE — Care Management Important Message (Signed)
Important Message  Patient Details  Name: Elizabeth Schwartz MRN: [id number] Date of Birth: 1946-06-19   Important Message Given:  Yes - Medicare IM     Sherilyn Banker 10/16/2023, 3:14 PM

## 2023-10-16 NOTE — Progress Notes (Signed)
Physical Therapy Treatment Patient Details Name: Elizabeth Schwartz MRN: [id number] DOB: 1946-05-18 Today's Date: 10/16/2023   History of Present Illness Pt is a 77 y/o F admitted on 09/17/23 after being advised by [facility] nurse to go to the ED after assessing pt's wound. CT showed necrotic myositis, pt also found to have acute osteomyelitis. Pt found to have symptomatic orthostatic hypotension during therapies 11/6-11/8, and consistently following week.  S/P I&D 11/11 and 11/20. Possible surgergy again 12/3. PMH: anemia, COPD, dementia, depression, DM, HLD, HTN, osteoporosis, schizo-affective, SOB, syncope.    PT Comments  Pt pleasant and agreeable to therapy session. Demonstrates slight progression towards therapy goals, ambulating 40 ft with a walker, utilizing a step to pattern. BP pre mobility 136/46 (74), post mobility 121/50. Continues with gait abnormalities, impaired standing balance, decreased activity tolerance. Patient will benefit from continued inpatient follow up therapy, <3 hours/day.   If plan is discharge home, recommend the following: A little help with bathing/dressing/bathroom;Assistance with cooking/housework;Supervision due to cognitive status;Direct supervision/assist for financial management;Assist for transportation;Help with stairs or ramp for entrance;Direct supervision/assist for medications management;A lot of help with walking and/or transfers   Can travel by private vehicle     Yes  Equipment Recommendations  Rolling walker (2 wheels);BSC/3in1;Wheelchair (measurements PT);Wheelchair cushion (measurements PT)    Recommendations for Other Services       Precautions / Restrictions Precautions Precautions: Fall Required Braces or Orthoses: Other Brace Other Brace: L "foot up" brace, TED hose, abdominal binder Restrictions Weight Bearing Restrictions: No     Mobility  Bed Mobility Overal bed mobility: Needs Assistance Bed Mobility: Supine to Sit     Supine to  sit: Contact guard          Transfers Overall transfer level: Needs assistance Equipment used: Rolling walker (2 wheels) Transfers: Sit to/from Stand Sit to Stand: Contact guard assist           General transfer comment: Steadying assist    Ambulation/Gait Ambulation/Gait assistance: Min assist Gait Distance (Feet): 40 Feet Assistive device: Rolling walker (2 wheels) Gait Pattern/deviations: Step-to pattern, Trunk flexed, Decreased dorsiflexion - left, Decreased weight shift to left, Decreased step length - left, Antalgic Gait velocity: decreased Gait velocity interpretation: <1.31 ft/sec, indicative of household ambulator   General Gait Details: Slowed, step to pattern, verbal cues for sequencing/direction and self monitoring for activity tolerance   Stairs             Wheelchair Mobility     Tilt Bed    Modified Rankin (Stroke Patients Only)       Balance Overall balance assessment: Needs assistance Sitting-balance support: Feet supported, Single extremity supported, No upper extremity supported Sitting balance-Leahy Scale: Good     Standing balance support: Bilateral upper extremity supported, Single extremity supported, During functional activity Standing balance-Leahy Scale: Poor                              Cognition Arousal: Alert Behavior During Therapy: Flat affect Overall Cognitive Status: History of cognitive impairments - at baseline                                 General Comments: Pt aware of procedure tomorrow, but still with confusion        Exercises      General Comments        Pertinent Vitals/Pain Pain  Assessment Pain Assessment: Faces Faces Pain Scale: Hurts little more Pain Location: L knee distally Pain Descriptors / Indicators: Grimacing, Discomfort Pain Intervention(s): Limited activity within patient's tolerance, Monitored during session    Home Living                           Prior Function            PT Goals (current goals can now be found in the care plan section) Acute Rehab PT Goals Patient Stated Goal: none stated Time For Goal Achievement: 10/30/23 Potential to Achieve Goals: Fair Progress towards PT goals: Progressing toward goals    Frequency    Min 1X/week      PT Plan      Co-evaluation              AM-PAC PT "6 Clicks" Mobility   Outcome Measure  Help needed turning from your back to your side while in a flat bed without using bedrails?: A Little Help needed moving from lying on your back to sitting on the side of a flat bed without using bedrails?: A Little Help needed moving to and from a bed to a chair (including a wheelchair)?: A Little Help needed standing up from a chair using your arms (e.g., wheelchair or bedside chair)?: A Little Help needed to walk in hospital room?: A Little Help needed climbing 3-5 steps with a railing? : Total 6 Click Score: 16    End of Session Equipment Utilized During Treatment: Gait belt Activity Tolerance: Patient tolerated treatment well Patient left: in chair;with call bell/phone within reach;with chair alarm set Nurse Communication: Mobility status PT Visit Diagnosis: Muscle weakness (generalized) (M62.81);Pain;Other abnormalities of gait and mobility (R26.89);Difficulty in walking, not elsewhere classified (R26.2) Pain - Right/Left: Left Pain - part of body: Leg;Shoulder     Time: 4098-1191 PT Time Calculation (min) (ACUTE ONLY): 30 min  Charges:    $Therapeutic Activity: 23-37 mins PT General Charges $$ ACUTE PT VISIT: 1 Visit                     Lillia Pauls, PT, DPT Acute Rehabilitation Services Office [phone]    Norval Morton 10/16/2023, 4:24 PM

## 2023-10-16 NOTE — TOC Progression Note (Signed)
Transition of Care [facility]) - Progression Note    Patient Details  Name: Elizabeth Schwartz MRN: [id number] Date of Birth: [birth year]  [facility] of [facility]) CM/SW Contact  Lorri Frederick, LCSW Phone Number: 10/16/2023, 10:22 AM  Clinical Narrative:   CSW reeived call from Walter/APS.  Assigned case worker for this case is Maxie Better, [phone].  CSW LM with Maxie Better.    Expected Discharge Plan: Skilled Nursing Facility Barriers to Discharge: SNF Pending bed offer  Expected Discharge Plan and Services In-house Referral: Clinical Social Work   Post Acute Care Choice: Skilled Nursing Facility Living arrangements for the past 2 months: Single Family Home                                       Social Determinants of Health (SDOH) Interventions SDOH Screenings   Food Insecurity: No Food Insecurity (09/18/2023)  Housing: Patient Unable To Answer (09/18/2023)  Transportation Needs: No Transportation Needs (09/18/2023)  Utilities: Not At Risk (09/18/2023)  Alcohol Screen: Low Risk  (03/22/2023)  Depression (PHQ2-9): Low Risk  (07/13/2023)  Financial Resource Strain: Low Risk  (03/22/2023)  Physical Activity: Insufficiently Active (03/22/2023)  Social Connections: Socially Isolated (03/22/2023)  Stress: No Stress Concern Present (03/22/2023)  Tobacco Use: Medium Risk (10/04/2023)    Readmission Risk Interventions     No data to display

## 2023-10-16 NOTE — Progress Notes (Signed)
Elizabeth Schwartz is awake and alert today.  She clearly stated what we will be doing tomorrow and at what time.  Plan on taking her to the operating room tomorrow afternoon for a third debridement of the thigh wound left leg.  Will obtain additional tissue for culture at that time.  I do not anticipate placing any myriad tomorrow.  White blood count is within normal range and hemoglobin and hematocrit have remained stable.  Will proceed at her request.

## 2023-10-16 NOTE — Plan of Care (Signed)

## 2023-10-16 NOTE — Plan of Care (Addendum)
Saw patient at bedside as Dr. Ladona Ridgel of Plastic Surgery was finishing up conversation concerning debridement procedure for tomorrow.  Spoke with patient independently and she correctly identified that she will be undergoing a left thigh debridement procedure tomorrow as discussed with Dr. Ladona Ridgel shortly before.  Discussed risks and benefits and she was able to correctly state risks of infection, bleeding, and tissue damage as well as voice desire for procedure given benefits for healing of the wound.  Patient has capacity to consent and she wishes to move forward with the procedure.  Consent form signed and dated and returned to RN.

## 2023-10-16 NOTE — Consult Note (Signed)
Consultation Note Date: 10/16/2023   Patient Name: Elizabeth Schwartz  DOB: 06-10-1946  MRN: [id number]  Age / Sex: 77 y.o., female  PCP: Storm Frisk, MD Referring Physician: Westley Chandler, MD  Reason for Consultation:  12 with myositis of L femur-- no decision maker, patient intermittently has capacity (?), consult to discuss GOC, decision making  HPI/Patient Profile: 77 y.o. female  with past medical history of schizophrenia, Alzheimer's dementia, COPD, osteoporosis admitted on 09/17/2023 with L thigh wound infection. Wound occurred in September of this year due to compressive injury from a stove falling on her. During this admission she has had I&D 11/11, debridement surgery 11/20 and had PICC placed for continued IV antibiotics. She is scheduled for additional debridement on 12/3. Palliative medicine consulted for goals of care.  Primary Decision Maker PATIENT she has HCPOA document on her chart with 3 designated surrogates however, providers have not been able to reach any of them. Referral has been made to APS.  Discussion: Chart reviewed including labs, progress notes, imaging from this and previous encounters.  Noted patient is stable for discharge to SNF, however, SNF is requesting patient be placed under guardianship for assistance with obtaining medicaid.  Met with patient. She was awake, alert, and oriented.  She is able tell me she is in the hospital due to a wound on her left leg. She recounts that she has had surgery and is expecting to have another surgery.  She has not been able to reach her niece or any of her family members for some time and she believes this has something to do with finances.  We discussed her GOC and advanced care planning.  She is very clear that her GOC are to continue interventions that are both life prolonging and that will increase her quality of life.  However,  she notes that if she dying, she would not want any artificial life support. If her heart stopped and she stopped breathing she would not want CPR. She stated that she did not think those interventions would improve her quality of life. We discussed DNR order and she is in agreement with this. She notes that when she was in touch with her niece they had discussed these things and her wishes.   At this time patient appears to have capacity for decision making related to going to nursing facility, for GOC, and for code status. She was able to relate what decision needed to be made, what her choice is, and provide reasonable rationale for her choice.    Capacity can wax and wane from one moment to another and can vary based on the decision being made.      SUMMARY OF RECOMMENDATIONS -Continue current plan of care -Code status change to DNR- limited interventions    Code Status/Advance Care Planning: DNR   Prognosis:   Unable to determine  Discharge Planning: To Be Determined  Primary Diagnoses: Present on Admission:  Myositis of left thigh  Osteomyelitis of left femur (HCC)  Pressure injury  of left thigh, stage 4 (HCC)  Normocytic anemia   Review of Systems  Physical Exam  Vital Signs: BP (!) 127/49 (BP Location: Left Arm)   Pulse 88   Temp 98.1 F (36.7 C) (Oral)   Resp 16   Ht 5\' 5"  (1.651 m)   Wt 68.9 kg   SpO2 93%   BMI 25.29 kg/m  Pain Scale: 0-10 POSS *See Group Information*: S-Acceptable,Sleep, easy to arouse Pain Score: 0-No pain   SpO2: SpO2: 93 % O2 Device:SpO2: 93 % O2 Flow Rate: .O2 Flow Rate (L/min): 2.22 L/min  IO: Intake/output summary:  Intake/Output Summary (Last 24 hours) at 10/16/2023 1212 Last data filed at 10/16/2023 0900 Gross per 24 hour  Intake 840 ml  Output 1002 ml  Net -162 ml    LBM: Last BM Date : 10/12/23 Baseline Weight: Weight: 74.8 kg Most recent weight: Weight: 68.9 kg       Thank you for this consult. Palliative medicine  will continue to follow and assist as needed.   Signed by: Ocie Bob, AGNP-C Palliative Medicine  Time includes:   Preparing to see the patient (e.g., review of tests) Obtaining and/or reviewing separately obtained history Performing a medically necessary appropriate examination and/or evaluation Counseling and educating the patient/family/caregiver Ordering medications, tests, or procedures Referring and communicating with other health care professionals (when not reported separately) Documenting clinical information in the electronic or other health record Independently interpreting results (not reported separately) and communicating results to the patient/family/caregiver Care coordination (not reported separately) Clinical documentation   Please contact Palliative Medicine Team phone at [phone] for questions and concerns.  For individual provider: See Loretha Stapler

## 2023-10-16 NOTE — Assessment & Plan Note (Signed)
Disposition was pending APS case worker placement, but was evaluated by Palliative Care today and determined to have capacity.  Stated DNR status and consented to transfer to SNF.

## 2023-10-16 NOTE — Assessment & Plan Note (Signed)
 Early acute osteomyelitis found on CT. IV abx treatment plan as above.

## 2023-10-16 NOTE — Progress Notes (Signed)
Daily Progress Note Intern Pager: [phone]  Patient name: Elizabeth Schwartz Medical record number: [id number] Date of birth: [birth year] Age: 77 y.o. Gender: female  Primary Care Provider: Storm Frisk, MD Consultants: Plastics, ID Code Status: DNR  Pt Overview and Major Events to Date:  11/3: Admitted to FMTS 11/11: I&D L thigh 11/17: Meropenem started 11/20: Left thigh wound evaluation under anesthesia w/ debridement 11/24: Daptomycin added due to MRSA in deep wound culture 11/26: PICC placed 12/03 (scheduled): Return to OR with Dr. Ladona Ridgel for debridement  Assessment and Plan: Elizabeth Schwartz is a 77 y.o. female with a pertinent PMH of schizophrenia, dementia, COPD, osteoporosis who presented with left thigh wound in setting of crush injury and was admitted for necrotizing myositis and osteomyelitis, currently s/p multiple debridements with plastics and with multiple organisms growing in wound.   Awaiting transfer to SNF, but complicated by difficulty obtaining consent given lack capacity.  APS case open and working on obtaining legal guardian given waxing capacity, case worker has been assigned.  This morning, palliative care has spoken with patient and determined that she has capacity to consent for DNR status and transfer to SNF.  Will revisit conversation about debridement surgery consent for tomorrow and obtain consent if she is able to voice risks/benefits and complications consistently. Assessment & Plan Necrotizing myositis Left thigh wound 2/2 crush injury now s/p I&D on 11/11 and additional debridement on 11/20.  -ID following, appreciate recommendations -Continue Meropenem, Daptomycin via PICC -Cont gentamicin ointment twice daily with dressing changes for topical coverage -Pain management: Scheduled tylenol 650mg  q6h, PRN oxycodone 2.5-5 mg Q4h -Weekly labs on Fridays -Plastic surgery following, scheduled for additional wound evaluation in OR  10/17/23  -Obtaining consent today if able, will perform full capacity evaluation Goals of care, counseling/discussion Disposition was pending APS case worker placement, but was evaluated by Palliative Care today and determined to have capacity.  Stated DNR status and consented to transfer to SNF. Osteomyelitis of left femur (HCC) Early acute osteomyelitis found on CT. IV abx treatment plan as above.  FEN/GI: Regular PPx: Lovenox Dispo: SNF pending APS guardianship establishement  Subjective:  This morning, patient states she feels "okay."  Objective: Temp:  [98 F (36.7 C)-98.8 F (37.1 C)] 98.1 F (36.7 C) (12/02 0755) Pulse Rate:  [76-99] 88 (12/02 0755) Resp:  [16-18] 16 (12/02 0755) BP: (116-135)/(49-68) 127/49 (12/02 0755) SpO2:  [93 %-98 %] 93 % (12/02 0838) Weight:  [68.9 kg] 68.9 kg (12/01 1241)  Physical Exam: General: Frail elderly female, resting in bed, NAD, alert to self and place. Cardiovascular: Regular rate and rhythm. Normal S1/S2. No murmurs, rubs, or gallops appreciated. 2+ radial pulses. Pulmonary: Referred upper airway sounds on auscultation. No increased WOB, no accessory muscle usage on room air. No wheezes, rales, or crackles. Abdominal: Normoactive bowel sounds, nondistended. No tenderness to deep or light palpation. No rebound or guarding. Extremities: Trace peripheral edema bilaterally.  Left thigh wound wrapped, dressing clean and without drainage.  Laboratory: Most recent CBC Lab Results  Component Value Date   WBC 5.4 10/13/2023   HGB 7.8 (L) 10/13/2023   HCT 25.4 (L) 10/13/2023   MCV 92.4 10/13/2023   PLT 365 10/13/2023   Most recent BMP    Latest Ref Rng & Units 10/13/2023    3:35 AM  BMP  Glucose 70 - 99 mg/dL 130   BUN 8 - 23 mg/dL 25   Creatinine 8.65 - 1.00 mg/dL 7.84   Sodium  135 - 145 mmol/L 141   Potassium 3.5 - 5.1 mmol/L 3.7   Chloride 98 - 111 mmol/L 110   CO2 22 - 32 mmol/L 23   Calcium 8.9 - 10.3 mg/dL 9.1     Other  pertinent labs: -None  New Imaging/Diagnostic Tests: -None  Rabiah Goeser, MD 10/16/2023, 8:47 AM  PGY-1, [place] [facility] FPTS Intern pager: [phone], text pages welcome Secure chat group [facility] Teaching Service

## 2023-10-16 NOTE — Assessment & Plan Note (Addendum)
Left thigh wound 2/2 crush injury now s/p I&D on 11/11 and additional debridement on 11/20.  -ID following, appreciate recommendations -Continue Meropenem, Daptomycin via PICC -Cont gentamicin ointment twice daily with dressing changes for topical coverage -Pain management: Scheduled tylenol 650mg  q6h, PRN oxycodone 2.5-5 mg Q4h -Weekly labs on Fridays -Plastic surgery following, scheduled for additional wound evaluation in OR 10/17/23  -Obtaining consent today if able, will perform full capacity evaluation

## 2023-10-17 ENCOUNTER — Ambulatory Visit (HOSPITAL_COMMUNITY): Admission: RE | Admit: 2023-10-17 | Payer: Medicare Other | Source: Home / Self Care | Admitting: Plastic Surgery

## 2023-10-17 ENCOUNTER — Other Ambulatory Visit: Payer: Self-pay

## 2023-10-17 ENCOUNTER — Encounter (HOSPITAL_COMMUNITY)
Admission: EM | Disposition: A | Discharge status: Skilled Nursing Facility | Payer: Self-pay | Source: Home / Self Care | Attending: Family Medicine

## 2023-10-17 ENCOUNTER — Inpatient Hospital Stay (HOSPITAL_COMMUNITY): Payer: Medicare Other | Admitting: Certified Registered Nurse Anesthetist

## 2023-10-17 ENCOUNTER — Encounter (HOSPITAL_COMMUNITY): Payer: Self-pay | Admitting: Student

## 2023-10-17 DIAGNOSIS — D500 Iron deficiency anemia secondary to blood loss (chronic): Secondary | ICD-10-CM

## 2023-10-17 DIAGNOSIS — I129 Hypertensive chronic kidney disease with stage 1 through stage 4 chronic kidney disease, or unspecified chronic kidney disease: Secondary | ICD-10-CM | POA: Diagnosis not present

## 2023-10-17 DIAGNOSIS — S71102D Unspecified open wound, left thigh, subsequent encounter: Secondary | ICD-10-CM | POA: Diagnosis not present

## 2023-10-17 DIAGNOSIS — E1122 Type 2 diabetes mellitus with diabetic chronic kidney disease: Secondary | ICD-10-CM

## 2023-10-17 DIAGNOSIS — M609 Myositis, unspecified: Secondary | ICD-10-CM

## 2023-10-17 DIAGNOSIS — M6080 Other myositis, unspecified site: Secondary | ICD-10-CM | POA: Diagnosis not present

## 2023-10-17 DIAGNOSIS — N189 Chronic kidney disease, unspecified: Secondary | ICD-10-CM

## 2023-10-17 HISTORY — PX: MINOR IRRIGATION AND DEBRIDEMENT OF WOUND: SHX6239

## 2023-10-17 LAB — MINIMUM INHIBITORY CONC. (1 DRUG): Collection Time: 1652

## 2023-10-17 LAB — CBC WITH DIFFERENTIAL/PLATELET
Abs Immature Granulocytes: 0.03 10*3/uL (ref 0.00–0.07)
Basophils Absolute: 0 10*3/uL (ref 0.0–0.1)
Basophils Relative: 1 %
Eosinophils Absolute: 0.2 10*3/uL (ref 0.0–0.5)
Eosinophils Relative: 3 %
HCT: 28.2 % — ABNORMAL LOW (ref 36.0–46.0)
Hemoglobin: 8.4 g/dL — ABNORMAL LOW (ref 12.0–15.0)
Immature Granulocytes: 1 %
Lymphocytes Relative: 28 %
Lymphs Abs: 1.8 10*3/uL (ref 0.7–4.0)
MCH: 27.1 pg (ref 26.0–34.0)
MCHC: 29.8 g/dL — ABNORMAL LOW (ref 30.0–36.0)
MCV: 91 fL (ref 80.0–100.0)
Monocytes Absolute: 0.5 10*3/uL (ref 0.1–1.0)
Monocytes Relative: 8 %
Neutro Abs: 3.8 10*3/uL (ref 1.7–7.7)
Neutrophils Relative %: 59 %
Platelets: 354 10*3/uL (ref 150–400)
RBC: 3.1 MIL/uL — ABNORMAL LOW (ref 3.87–5.11)
RDW: 18.9 % — ABNORMAL HIGH (ref 11.5–15.5)
WBC: 6.4 10*3/uL (ref 4.0–10.5)
nRBC: 0 % (ref 0.0–0.2)

## 2023-10-17 LAB — CBC
HCT: 27.3 % — ABNORMAL LOW (ref 36.0–46.0)
Hemoglobin: 8.4 g/dL — ABNORMAL LOW (ref 12.0–15.0)
MCH: 27.8 pg (ref 26.0–34.0)
MCHC: 30.8 g/dL (ref 30.0–36.0)
MCV: 90.4 fL (ref 80.0–100.0)
Platelets: 347 10*3/uL (ref 150–400)
RBC: 3.02 MIL/uL — ABNORMAL LOW (ref 3.87–5.11)
RDW: 18.7 % — ABNORMAL HIGH (ref 11.5–15.5)
WBC: 6.4 10*3/uL (ref 4.0–10.5)
nRBC: 0 % (ref 0.0–0.2)

## 2023-10-17 LAB — GLUCOSE, CAPILLARY
Glucose-Capillary: 92 mg/dL (ref 70–99)
Glucose-Capillary: 95 mg/dL (ref 70–99)

## 2023-10-17 LAB — MIC RESULT

## 2023-10-17 LAB — AEROBIC/ANAEROBIC CULTURE W GRAM STAIN (SURGICAL/DEEP WOUND)

## 2023-10-17 SURGERY — MINOR IRRIGATION AND DEBRIDEMENT OF WOUND
Anesthesia: General | Laterality: Left

## 2023-10-17 MED ORDER — 0.9 % SODIUM CHLORIDE (POUR BTL) OPTIME
TOPICAL | Status: DC | PRN
  Administered 2023-10-17: 1000 mL

## 2023-10-17 MED ORDER — ENOXAPARIN SODIUM 40 MG/0.4ML IJ SOSY
40.0000 mg | PREFILLED_SYRINGE | INTRAMUSCULAR | Status: DC
Start: 2023-10-18 — End: 2023-11-01
  Administered 2023-10-18 – 2023-10-31 (×14): 40 mg via SUBCUTANEOUS
  Filled 2023-10-17 (×14): qty 0.4

## 2023-10-17 MED ORDER — LIDOCAINE HCL (CARDIAC) PF 100 MG/5ML IV SOSY
PREFILLED_SYRINGE | INTRAVENOUS | Status: DC | PRN
  Administered 2023-10-17: 2 mL via INTRATRACHEAL

## 2023-10-17 MED ORDER — CHLORHEXIDINE GLUCONATE 0.12 % MT SOLN
OROMUCOSAL | Status: AC
  Administered 2023-10-17: 15 mL via OROMUCOSAL
  Filled 2023-10-17: qty 15

## 2023-10-17 MED ORDER — ORAL CARE MOUTH RINSE: 15.0000 mL | Freq: Once | OROMUCOSAL | Status: AC

## 2023-10-17 MED ORDER — PROPOFOL 500 MG/50ML IV EMUL
INTRAVENOUS | Status: DC | PRN
  Administered 2023-10-17: 120 mg via INTRAVENOUS

## 2023-10-17 MED ORDER — ONDANSETRON HCL 4 MG/2ML IJ SOLN
INTRAMUSCULAR | Status: DC | PRN
  Administered 2023-10-17: 4 mg via INTRAVENOUS

## 2023-10-17 MED ORDER — LACTATED RINGERS IV SOLN
INTRAVENOUS | Status: DC
Start: 2023-10-17 — End: 2023-10-17

## 2023-10-17 MED ORDER — CHLORHEXIDINE GLUCONATE 0.12 % MT SOLN: 15.0000 mL | Freq: Once | OROMUCOSAL | Status: AC

## 2023-10-17 MED ORDER — VASHE WOUND IRRIGATION OPTIME
TOPICAL | Status: DC | PRN
  Administered 2023-10-17: 68 [oz_av]

## 2023-10-17 SURGICAL SUPPLY — 52 items
APPLICATOR COTTON TIP 6 STRL (MISCELLANEOUS) IMPLANT
APPLICATOR COTTON TIP 6IN STRL (MISCELLANEOUS) IMPLANT
BAG COUNTER SPONGE SURGICOUNT (BAG) ×2 IMPLANT
BAG DECANTER FOR FLEXI CONT (MISCELLANEOUS) IMPLANT
BENZOIN TINCTURE PRP APPL 2/3 (GAUZE/BANDAGES/DRESSINGS) ×2 IMPLANT
BNDG GAUZE DERMACEA FLUFF 4 (GAUZE/BANDAGES/DRESSINGS) IMPLANT
CANISTER SUCT 3000ML PPV (MISCELLANEOUS) ×2 IMPLANT
CNTNR URN SCR LID CUP LEK RST (MISCELLANEOUS) IMPLANT
COVER SURGICAL LIGHT HANDLE (MISCELLANEOUS) ×2 IMPLANT
DRAIN CHANNEL 19F RND (DRAIN) IMPLANT
DRAIN JP 10F RND SILICONE (MISCELLANEOUS) IMPLANT
DRAPE HALF SHEET 40X57 (DRAPES) IMPLANT
DRAPE IMP U-DRAPE 54X76 (DRAPES) ×2 IMPLANT
DRAPE INCISE IOBAN 66X45 STRL (DRAPES) IMPLANT
DRAPE LAPAROSCOPIC ABDOMINAL (DRAPES) IMPLANT
DRAPE LAPAROTOMY 100X72 PEDS (DRAPES) ×2 IMPLANT
DRSG ADAPTIC 3X8 NADH LF (GAUZE/BANDAGES/DRESSINGS) IMPLANT
DRSG CALCIUM ALGINATE 4X4 (GAUZE/BANDAGES/DRESSINGS) IMPLANT
DRSG VAC GRANUFOAM LG (GAUZE/BANDAGES/DRESSINGS) IMPLANT
DRSG VAC GRANUFOAM MED (GAUZE/BANDAGES/DRESSINGS) IMPLANT
DRSG VAC GRANUFOAM SM (GAUZE/BANDAGES/DRESSINGS) IMPLANT
ELECT CAUTERY BLADE 6.4 (BLADE) IMPLANT
ELECT REM PT RETURN 9FT ADLT (ELECTROSURGICAL) ×1
ELECTRODE REM PT RTRN 9FT ADLT (ELECTROSURGICAL) ×2 IMPLANT
GAUZE PAD ABD 8X10 STRL (GAUZE/BANDAGES/DRESSINGS) IMPLANT
GAUZE SPONGE 4X4 12PLY STRL (GAUZE/BANDAGES/DRESSINGS) IMPLANT
GLOVE BIO SURGEON STRL SZ8 (GLOVE) ×2 IMPLANT
GLOVE BIOGEL PI IND STRL 8 (GLOVE) ×2 IMPLANT
GOWN STRL REUS W/ TWL LRG LVL3 (GOWN DISPOSABLE) ×6 IMPLANT
GOWN STRL REUS W/TWL XL LVL3 (GOWN DISPOSABLE) ×2 IMPLANT
KIT BASIN OR (CUSTOM PROCEDURE TRAY) ×2 IMPLANT
KIT TURNOVER KIT B (KITS) ×2 IMPLANT
NDL HYPO 25GX1X1/2 BEV (NEEDLE) ×2 IMPLANT
NEEDLE HYPO 25GX1X1/2 BEV (NEEDLE) ×1 IMPLANT
NS IRRIG 1000ML POUR BTL (IV SOLUTION) ×2 IMPLANT
PACK GENERAL/GYN (CUSTOM PROCEDURE TRAY) ×2 IMPLANT
PACK UNIVERSAL I (CUSTOM PROCEDURE TRAY) ×2 IMPLANT
PAD ARMBOARD 7.5X6 YLW CONV (MISCELLANEOUS) ×4 IMPLANT
STAPLER VISISTAT 35W (STAPLE) ×2 IMPLANT
SURGILUBE 2OZ TUBE FLIPTOP (MISCELLANEOUS) IMPLANT
SUT MNCRL AB 3-0 PS2 27 (SUTURE) IMPLANT
SUT MNCRL AB 4-0 PS2 18 (SUTURE) IMPLANT
SUT MON AB 2-0 CT1 36 (SUTURE) IMPLANT
SUT MON AB 5-0 PS2 18 (SUTURE) IMPLANT
SUT VIC AB 5-0 PS2 18 (SUTURE) IMPLANT
SUT VICRYL 3 0 (SUTURE) IMPLANT
SWAB COLLECTION DEVICE MRSA (MISCELLANEOUS) IMPLANT
SWAB CULTURE ESWAB REG 1ML (MISCELLANEOUS) IMPLANT
SYR BULB IRRIG 60ML STRL (SYRINGE) IMPLANT
SYR CONTROL 10ML LL (SYRINGE) ×2 IMPLANT
TOWEL GREEN STERILE (TOWEL DISPOSABLE) ×2 IMPLANT
UNDERPAD 30X36 HEAVY ABSORB (UNDERPADS AND DIAPERS) ×2 IMPLANT

## 2023-10-17 NOTE — Assessment & Plan Note (Addendum)
 Early acute osteomyelitis found on CT. IV abx treatment plan as above.

## 2023-10-17 NOTE — Op Note (Addendum)
DATE OF OPERATION: 10/17/2023  LOCATION: [facility] Main operating Room  PREOPERATIVE DIAGNOSIS: Left thigh wound  POSTOPERATIVE DIAGNOSIS: Same  PROCEDURE: Examination of left thigh wound under anesthesia with debridement of wound and tissue culture  SURGEON: Loren Racer, MD  ASSISTANT: Caroline More, Mia Hancock medical student  EBL: 10 cc  CONDITION: Stable  COMPLICATIONS: None  INDICATION: The patient, Elizabeth Schwartz, is a 77 y.o. female born on [birth year], is here for treatment and ongoing wound on the anterior portion of the left thigh.   PROCEDURE DETAILS:  The patient was seen prior to surgery and marked.  The the patient is already on IV antibiotics and these were continued.  The patient was taken to the operating room and given a general anesthetic. A standard time out was performed and all information was confirmed by those in the room. SCDs were placed.   The thigh was prepped and draped in usual sterile manner.  The wound was mostly filled with new granulation tissue but there was a small area of necrotic appearing tissue.  The wound measurements were 9 cm in width 5-1/2 cm in length and 4-1/2 cm in depth at its deepest point.  There was a small amount of necrotic material in the wound. While unclear exactly which tissue this was, it appeared to be fascia and subcutaneous tissue.  This was debrided sharply with scissors.  The total area debrided was approximately 3 cm x 1 cm X 1 cm.  and all tissue was sent to the laboratory for culture and sensitivity.  Swabs of the wound were also obtained for culture and sensitivity.  The wound was irrigated with Vashe irrigant and fresh dressings were applied.  The patient was awakened from anesthesia without incident transferred to the recovery room in good condition.  All instrument needle and sponge counts were reported as correct and there were no complications noted. The patient was allowed to wake up and taken to recovery room in stable  condition at the end of the case. The family was notified at the end of the case.   The advanced practice practitioner (APP) assisted throughout the case.  The APP was essential in retraction and counter traction when needed to make the case progress smoothly.  This retraction and assistance made it possible to see the tissue plans for the procedure.  The assistance was needed for blood control, tissue re-approximation and assisted with closure of the incision site.

## 2023-10-17 NOTE — Assessment & Plan Note (Addendum)
Left thigh wound 2/2 crush injury now s/p I&D on 11/11 and additional debridement on 11/20, as well as today. -ID following, appreciate recommendations -Continue Meropenem, Daptomycin via PICC until 12/23 per ID -Cont gentamicin ointment twice daily with dressing changes for topical coverage -Pain management: Scheduled tylenol 650mg  Q6h, PRN oxycodone 2.5-5 mg Q4h -Weekly labs on Fridays -Plastic surgery following, follow-up recs after surgical debridement today

## 2023-10-17 NOTE — Progress Notes (Signed)
Daily Progress Note Intern Pager: [phone]  Patient name: Elizabeth Schwartz Medical record number: [id number] Date of birth: 1946-09-25 Age: 77 y.o. Gender: female  Primary Care Provider: Storm Frisk, MD Consultants: Plastics, ID Code Status: DNR  Pt Overview and Major Events to Date:  11/3: Admitted to FMTS 11/11: I&D L thigh 11/17: Meropenem started 11/20: Left thigh wound evaluation under anesthesia w/ debridement 11/24: Daptomycin added due to MRSA in deep wound culture 11/26: PICC placed 12/03: Return to OR with Dr. Ladona Ridgel for debridement   Assessment and Plan: Elizabeth Schwartz is a 77 y.o. female with a pertinent PMH of schizophrenia, dementia, COPD, osteoporosis who presented with left thigh wound in setting of crush injury and was admitted for necrotizing myositis and osteomyelitis, currently s/p multiple debridements with plastics and with multiple organisms growing in wound.  Patient will likely need to discharge to SNF.  TOC to follow-up with APS case worker about SNF placement today.  Her capacity to consent to procedures and placement does seem to wax and wane throughout the day, more consistently able to consent later in the day, and is very somnolent each morning.  Will undergo surgical debridement this afternoon with plastic surgery, FMTS will follow-up recommendations after procedure. Assessment & Plan Necrotizing myositis Left thigh wound 2/2 crush injury now s/p I&D on 11/11 and additional debridement on 11/20, as well as today. -ID following, appreciate recommendations -Continue Meropenem, Daptomycin via PICC until 12/23 per ID -Cont gentamicin ointment twice daily with dressing changes for topical coverage -Pain management: Scheduled tylenol 650mg  Q6h, PRN oxycodone 2.5-5 mg Q4h -Weekly labs on Fridays -Plastic surgery following, follow-up recs after surgical debridement today Goals of care, counseling/discussion Disposition was pending APS case  worker placement, but was evaluated by Palliative Care yesterday and determined to have capacity.  Stated DNR status and consented to transfer to SNF. -TOC to follow-up with APS caseworker today Osteomyelitis of left femur (HCC) Early acute osteomyelitis found on CT.  IV abx treatment plan as above.  FEN/GI: Regular after procedure PPx: Lovenox after procedure Dispo: SNF pending APS guardianship establishement  Subjective:  This morning, patient is sleeping and drowsy upon awakening.  States she feels fine and returns to sleep.  Objective: Temp:  [98.4 F (36.9 C)-99.6 F (37.6 C)] 98.4 F (36.9 C) (12/03 0743) Pulse Rate:  [80-86] 84 (12/03 0743) Resp:  [17-20] 18 (12/03 0743) BP: (133-148)/(61-83) 146/72 (12/03 0743) SpO2:  [90 %-98 %] 98 % (12/03 0743)  Physical Exam: General: Frail elderly female, resting comfortably in bed, NAD, drowsy. Cardiovascular: Regular rate and rhythm. Normal S1/S2. No murmurs, rubs, or gallops appreciated. 2+ radial pulses. Pulmonary: Clear bilaterally to ascultation. No increased WOB, no accessory muscle usage on room air. No wheezes, rales, or crackles. Extremities: No peripheral edema bilaterally.  Left thigh wound with dry dressing, no drainage.  Laboratory: Most recent CBC Lab Results  Component Value Date   WBC 6.4 10/17/2023   HGB 8.4 (L) 10/17/2023   HCT 27.3 (L) 10/17/2023   MCV 90.4 10/17/2023   PLT 347 10/17/2023   Most recent BMP    Latest Ref Rng & Units 10/13/2023    3:35 AM  BMP  Glucose 70 - 99 mg/dL 102   BUN 8 - 23 mg/dL 25   Creatinine 7.25 - 1.00 mg/dL 3.66   Sodium 440 - 347 mmol/L 141   Potassium 3.5 - 5.1 mmol/L 3.7   Chloride 98 - 111 mmol/L 110  CO2 22 - 32 mmol/L 23   Calcium 8.9 - 10.3 mg/dL 9.1     Other pertinent labs: -None  New Imaging/Diagnostic Tests: -None  Jaquelyne Firkus, MD 10/17/2023, 8:11 AM  PGY-1, [facility] Family [facility] FPTS Intern pager: [phone], text pages welcome Secure  chat group [facility] Teaching Service

## 2023-10-17 NOTE — Interval H&P Note (Signed)
History and Physical Interval Note: No change in exam or indication for surgery. Patient is aware of what we will be doing today and agrees to proceed Site marked. All questions answered. Will proceed with debridement of the left thigh  10/17/2023 3:53 PM  Elizabeth Schwartz  has presented today for surgery, with the diagnosis of Myositis of left thigh.  The various methods of treatment have been discussed with the patient and family. After consideration of risks, benefits and other options for treatment, the patient has consented to  Procedure(s): left thigh wound evaluation (Left) as a surgical intervention.  The patient's history has been reviewed, patient examined, no change in status, stable for surgery.  I have reviewed the patient's chart and labs.  Questions were answered to the patient's satisfaction.     Santiago Glad

## 2023-10-17 NOTE — Assessment & Plan Note (Addendum)
Disposition was pending APS case worker placement, but was evaluated by Palliative Care yesterday and determined to have capacity.  Stated DNR status and consented to transfer to SNF. -TOC to follow-up with APS caseworker today

## 2023-10-17 NOTE — Anesthesia Preprocedure Evaluation (Addendum)
Anesthesia Evaluation  Patient identified by MRN, date of birth, ID band Patient awake    Reviewed: Allergy & Precautions, NPO status , Patient's Chart, lab work & pertinent test results  History of Anesthesia Complications Negative for: history of anesthetic complications  Airway Mallampati: II  TM Distance: >3 FB Neck ROM: Full    Dental  (+) Missing,    Pulmonary asthma , pneumonia, resolved, COPD,  COPD inhaler, former smoker   Pulmonary exam normal        Cardiovascular hypertension, Pt. on medications Normal cardiovascular exam     Neuro/Psych  PSYCHIATRIC DISORDERS  Depression  Schizophrenia Dementia  Neuromuscular disease    GI/Hepatic Neg liver ROS,GERD  Medicated,,  Endo/Other  diabetes, Well Controlled, Type 2, Oral Hypoglycemic Agents    Renal/GU Renal disease  negative genitourinary   Musculoskeletal Myositis left thigh   Abdominal   Peds  Hematology  (+) Blood dyscrasia, anemia   Anesthesia Other Findings Left thigh wound  Reproductive/Obstetrics                             Anesthesia Physical Anesthesia Plan  ASA: 3  Anesthesia Plan: General   Post-op Pain Management: Tylenol PO (pre-op)*   Induction: Intravenous  PONV Risk Score and Plan: 3 and Treatment may vary due to age or medical condition, Dexamethasone and Ondansetron  Airway Management Planned: LMA  Additional Equipment: None  Intra-op Plan:   Post-operative Plan: Extubation in OR  Informed Consent: I have reviewed the patients History and Physical, chart, labs and discussed the procedure including the risks, benefits and alternatives for the proposed anesthesia with the patient or authorized representative who has indicated his/her understanding and acceptance.   Patient has DNR.  Discussed DNR with patient and Suspend DNR.   Dental advisory given  Plan Discussed with: CRNA  Anesthesia Plan  Comments:         Anesthesia Quick Evaluation

## 2023-10-17 NOTE — TOC Progression Note (Addendum)
Transition of Care [facility]) - Progression Note    Patient Details  Name: Elizabeth Schwartz MRN: [id number] Date of Birth: 1945/12/06  [facility] of [facility]) CM/SW Contact  Lorri Frederick, LCSW Phone Number: 10/17/2023, 2:59 PM  Clinical Narrative:   CSW spoke with Shefi/APS.  She received pt records yesterday and has read them.  She directed CSW to note from Ocie Bob, 12/2, 1211pm that states pt has capacity for decision making, along with Dr Sharion Dove note at 1431 that she can consent for procedure.  She does not think she will be allowed to pursue guardianship based on this documentation.  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  She will not be eligible for medicaid without a spend down.  Shefi available if SNF wants to discuss issues related to her signing herself into SNF.  CSW spoke with Tammy Blakely/[facility].  Updated her on the above.  She will speak with APS and her business office and call back.     Expected Discharge Plan: Skilled Nursing Facility Barriers to Discharge: SNF Pending bed offer  Expected Discharge Plan and Services In-house Referral: Clinical Social Work   Post Acute Care Choice: Skilled Nursing Facility Living arrangements for the past 2 months: Single Family Home                                       Social Determinants of Health (SDOH) Interventions SDOH Screenings   Food Insecurity: No Food Insecurity (09/18/2023)  Housing: Patient Unable To Answer (09/18/2023)  Transportation Needs: No Transportation Needs (09/18/2023)  Utilities: Not At Risk (09/18/2023)  Alcohol Screen: Low Risk  (03/22/2023)  Depression (PHQ2-9): Low Risk  (07/13/2023)  Financial Resource Strain: Low Risk  (03/22/2023)  Physical Activity: Insufficiently Active (03/22/2023)  Social Connections: Socially Isolated  (03/22/2023)  Stress: No Stress Concern Present (03/22/2023)  Tobacco Use: Medium Risk (10/17/2023)    Readmission Risk Interventions     No data to display

## 2023-10-17 NOTE — Anesthesia Procedure Notes (Signed)
Procedure Name: LMA Insertion Date/Time: 10/17/2023 4:14 PM  Performed by: Hali Marry, CRNAPre-anesthesia Checklist: Patient identified, Emergency Drugs available, Suction available and Patient being monitored Patient Re-evaluated:Patient Re-evaluated prior to induction Oxygen Delivery Method: Circle system utilized Preoxygenation: Pre-oxygenation with 100% oxygen Induction Type: IV induction Ventilation: Mask ventilation without difficulty LMA: LMA inserted LMA Size: 4.0 Tube type: Oral Number of attempts: 1 Airway Equipment and Method: Stylet and Oral airway Placement Confirmation: ETT inserted through vocal cords under direct vision, positive ETCO2 and breath sounds checked- equal and bilateral Tube secured with: Tape Dental Injury: Teeth and Oropharynx as per pre-operative assessment

## 2023-10-17 NOTE — Progress Notes (Signed)
PHARMACY CONSULT NOTE FOR:  OUTPATIENT  PARENTERAL ANTIBIOTIC THERAPY (OPAT)  Indication: Left thigh soft tissue infection Regimen: Daptomycin 700 mg every 24 hours; Meropenem 1 gm every 8 hours  End date: 11/06/23  IV antibiotic discharge orders are pended. To discharging provider:  please sign these orders via discharge navigator,  Select New Orders & click on the button choice - Manage This Unsigned Work.     Thank you for allowing pharmacy to be a part of this patient's care.  Jani Gravel, PharmD Clinical Pharmacist  10/17/2023 7:57 AM

## 2023-10-17 NOTE — Plan of Care (Signed)
FMTS Interim Progress Note  S: Up to evaluate patient for post op after L thigh debridement. Patient reports she is doing well. She denies any nausea. Reports some left ankle pain but states this was occurring since before the surgery.   O: BP (!) 161/64 (BP Location: Left Arm)   Pulse 75   Temp 98.6 F (37 C) (Oral)   Resp 19   Ht 5\' 5"  (1.651 m)   Wt 68.9 kg   SpO2 97%   BMI 25.29 kg/m   General: A&O, NAD Respiratory: normal WOB Extremities: left thigh wrapped in bandage, clean, dry, intact. Left ankle covered with SCD. Slightly TTP. Normal ROM.     A/P: Patient is 77 yo F admitted for necrotizing myositis and osteomyelitis in the setting of crush injury, currently s/p multiple debridements with plastic surgery. Patient underwent another debridement today. Patient is doing well and seems to be recovering well.   Necrotizing myositis  Patient appears to be doing well after debridement. Per Dr. Jean Rosenthal, surgery went well with a small amount of necrotic tissue debrided.  - regular diet - plan to restart VTE prophylaxis tomorrow night - continue pain management and antibiotics as per day teams note   Penne Lash, MD 10/17/2023, 6:07 PM PGY-1, [facility] Family Medicine Service pager [phone]

## 2023-10-17 NOTE — Transfer of Care (Signed)
Immediate Anesthesia Transfer of Care Note  Patient: Elizabeth Schwartz  Procedure(s) Performed: left thigh wound evaluation, irrigation and debridement left thigh wound (Left)  Patient Location: PACU  Anesthesia Type:General  Level of Consciousness: awake, alert , and oriented  Airway & Oxygen Therapy: Patient Spontanous Breathing  Post-op Assessment: Report given to RN and Post -op Vital signs reviewed and stable  Post vital signs: Reviewed and stable  Last Vitals:  Vitals Value Taken Time  BP 144/69 10/17/23 1646  Temp    Pulse 81 10/17/23 1647  Resp 11 10/17/23 1647  SpO2 95 % 10/17/23 1647  Vitals shown include unfiled device data.  Last Pain:  Vitals:   10/17/23 1339  TempSrc: Oral  PainSc: 7       Patients Stated Pain Goal: 1 (10/17/23 1339)  Complications: No notable events documented.

## 2023-10-18 ENCOUNTER — Encounter (HOSPITAL_COMMUNITY): Payer: Self-pay | Admitting: Plastic Surgery

## 2023-10-18 DIAGNOSIS — M6080 Other myositis, unspecified site: Secondary | ICD-10-CM | POA: Diagnosis not present

## 2023-10-18 LAB — CBC
HCT: 28 % — ABNORMAL LOW (ref 36.0–46.0)
Hemoglobin: 8.6 g/dL — ABNORMAL LOW (ref 12.0–15.0)
MCH: 27.2 pg (ref 26.0–34.0)
MCHC: 30.7 g/dL (ref 30.0–36.0)
MCV: 88.6 fL (ref 80.0–100.0)
Platelets: 340 10*3/uL (ref 150–400)
RBC: 3.16 MIL/uL — ABNORMAL LOW (ref 3.87–5.11)
RDW: 18.2 % — ABNORMAL HIGH (ref 11.5–15.5)
WBC: 4.9 10*3/uL (ref 4.0–10.5)
nRBC: 0 % (ref 0.0–0.2)

## 2023-10-18 NOTE — Assessment & Plan Note (Signed)
 Early acute osteomyelitis found on CT. IV abx treatment plan as above.

## 2023-10-18 NOTE — TOC Progression Note (Signed)
Transition of Care [facility]) - Progression Note    Patient Details  Name: Elizabeth Schwartz MRN: [id number] Date of Birth: 15-Jan-1946  Transition of Care [facility]) CM/SW Contact  Lorri Frederick, LCSW Phone Number: 10/18/2023, 3:38 PM  Clinical Narrative:   [facility] met with pt, has also spoken with APS worker.  Will review situation with hear admin and let us know if can take.    Expected Discharge Plan: Skilled Nursing Facility Barriers to Discharge: SNF Pending bed offer  Expected Discharge Plan and Services In-house Referral: Clinical Social Work   Post Acute Care Choice: Skilled Nursing Facility Living arrangements for the past 2 months: Single Family Home                                       Social Determinants of Health (SDOH) Interventions SDOH Screenings   Food Insecurity: No Food Insecurity (09/18/2023)  Housing: Patient Unable To Answer (09/18/2023)  Transportation Needs: No Transportation Needs (09/18/2023)  Utilities: Not At Risk (09/18/2023)  Alcohol Screen: Low Risk  (03/22/2023)  Depression (PHQ2-9): Low Risk  (07/13/2023)  Financial Resource Strain: Low Risk  (03/22/2023)  Physical Activity: Insufficiently Active (03/22/2023)  Social Connections: Socially Isolated (03/22/2023)  Stress: No Stress Concern Present (03/22/2023)  Tobacco Use: Medium Risk (10/17/2023)    Readmission Risk Interventions     No data to display

## 2023-10-18 NOTE — Assessment & Plan Note (Addendum)
Disposition was pending APS case worker placement, but was evaluated by Palliative Care yesterday and determined to have capacity.  Stated DNR status and consented to transfer to SNF.  Will likely need to spend down given her assets to remain in LTC. -TOC to follow-up placement

## 2023-10-18 NOTE — Assessment & Plan Note (Addendum)
Left thigh wound 2/2 crush injury now s/p I&D on 11/11, 11/20, 12/3.  Following up wound cultures and plastic surgery recommendations. -ID following, appreciate recommendations -Continue Meropenem, Daptomycin via PICC until 12/23 per ID -Discontinuing gentamicin ointment twice daily with dressing changes -Pain management: Scheduled tylenol 650mg  Q6h, PRN oxycodone 2.5-5 mg Q4h -Weekly labs on Fridays -Plastic surgery following, follow-up recs based on wound cultures, possible debridement or wound VAC next week

## 2023-10-18 NOTE — Progress Notes (Signed)
Daily Progress Note Intern Pager: [phone]  Patient name: Elizabeth Schwartz Medical record number: [id number] Date of birth: March 05, 1946 Age: 77 y.o. Gender: female  Primary Care Provider: Storm Frisk, MD Consultants: Plastic Surgery, ID Code Status: DNR/DNI  Pt Overview and Major Events to Date:  11/3: Admitted to FMTS 11/11: I&D L thigh 11/17: Meropenem started 11/20: Left thigh wound evaluation under anesthesia w/ debridement 11/24: Daptomycin added due to MRSA in deep wound culture 11/26: PICC placed 12/03: Return to OR with Dr. Ladona Ridgel for debridement   Assessment and Plan: Ervin Nickoloff is a 77 y.o. female with a pertinent PMH of schizophrenia, dementia, COPD, and osteoporosis who presented with left thigh wound in setting of crush injury and was admitted for necrotizing myositis and osteomyelitis, currently s/p multiple debridements with plastics and with multiple organisms growing in wound.  Patient underwent her third surgical wound debridement with Dr. Ladona Ridgel of plastic surgery yesterday, who recommended contacting ID to discuss possibility of local antibiotics.  ID states that there is no indication for local antibiotic use and will plan to continue meropenem and daptomycin until 12/23.  Otherwise, Dr. Ladona Ridgel will watch wound cultures for evidence of Pseudomonas growing and consider debridement early next week.  Also consider placing wound vac based on wound cultures.  Wound is gradually granulating and is improved based on surgical images.  Terms of disposition, PT has recommended SNF but patient does not have Medicaid and has a number of assets.  Would likely need to spend down and facilitate long-term care.  TOC is on board and will assist as able.  APS caseworker has been assigned, though unlikely that they will be able to obtain custody given patient's intermittent capacity to consent to procedures and. Assessment & Plan Necrotizing myositis Left thigh wound  2/2 crush injury now s/p I&D on 11/11, 11/20, 12/3.  Following up wound cultures and plastic surgery recommendations. -ID following, appreciate recommendations -Continue Meropenem, Daptomycin via PICC until 12/23 per ID -Discontinuing gentamicin ointment twice daily with dressing changes -Pain management: Scheduled tylenol 650mg  Q6h, PRN oxycodone 2.5-5 mg Q4h -Weekly labs on Fridays -Plastic surgery following, follow-up recs based on wound cultures, possible debridement or wound VAC next week Goals of care, counseling/discussion Disposition was pending APS case worker placement, but was evaluated by Palliative Care yesterday and determined to have capacity.  Stated DNR status and consented to transfer to SNF.  Will likely need to spend down given her assets to remain in LTC. -TOC to follow-up placement Osteomyelitis of left femur (HCC) Early acute osteomyelitis found on CT.  IV abx treatment plan as above.  Chronic and Stable Problems: None  FEN/GI: Regular PPx: Restart enoxaparin at 1800 today Dispo: SNF pending bed offer. Barriers include possible need for APS given waxing and waning dementia, has already appropriately consented to SNF.  Subjective:  This morning, patient states that she is feeling well after her procedure yesterday.  Objective: Temp:  [97.9 F (36.6 C)-98.6 F (37 C)] 98.4 F (36.9 C) (12/04 0748) Pulse Rate:  [74-97] 74 (12/04 0748) Resp:  [16-20] 18 (12/04 0748) BP: (129-161)/(57-77) 146/69 (12/04 0748) SpO2:  [93 %-97 %] 96 % (12/04 0748) FiO2 (%):  [92 %] 92 % (12/03 0852) Weight:  [68.9 kg] 68.9 kg (12/03 1339)  Physical Exam: General: Frail elderly female, appears stated age, resting comfortably in bed, NAD, alert when awoken, oriented to self, place, situation. Cardiovascular: Regular rate and rhythm. Normal S1/S2. No murmurs, rubs, or  gallops appreciated. 2+ radial pulses. Pulmonary: Clear bilaterally to ascultation. No increased WOB, no accessory  muscle usage on room air. No wheezes, rales, or crackles. Skin: Warm and dry. Extremities: No peripheral edema bilaterally.  Left thigh pressure dressing in place and dry, no leaking or edema, appropriately tender to palpation.  LE pulses intact.  Laboratory: Most recent CBC Lab Results  Component Value Date   WBC 4.9 10/18/2023   HGB 8.6 (L) 10/18/2023   HCT 28.0 (L) 10/18/2023   MCV 88.6 10/18/2023   PLT 340 10/18/2023   Most recent BMP    Latest Ref Rng & Units 10/13/2023    3:35 AM  BMP  Glucose 70 - 99 mg/dL 409   BUN 8 - 23 mg/dL 25   Creatinine 8.11 - 1.00 mg/dL 9.14   Sodium 782 - 956 mmol/L 141   Potassium 3.5 - 5.1 mmol/L 3.7   Chloride 98 - 111 mmol/L 110   CO2 22 - 32 mmol/L 23   Calcium 8.9 - 10.3 mg/dL 9.1     Other pertinent labs: -None  New Imaging/Diagnostic Tests: -None  Zedekiah Hinderman, MD 10/18/2023, 8:38 AM  PGY-1, [place] [facility] FPTS Intern pager: [phone], text pages welcome Secure chat group [facility] Teaching Service

## 2023-10-18 NOTE — Plan of Care (Signed)
  Problem: Coping: Goal: Ability to adjust to condition or change in health will improve Outcome: Progressing   Problem: Health Behavior/Discharge Planning: Goal: Ability to manage health-related needs will improve Outcome: Progressing   

## 2023-10-18 NOTE — Progress Notes (Addendum)
Occupational Therapy Treatment Patient Details Name: Elizabeth Schwartz MRN: [id number] DOB: 06-06-1946 Today's Date: 10/18/2023   History of present illness Pt is a 77 y/o F admitted on 09/17/23 after being advised by [facility] nurse to go to the ED after assessing pt's wound. CT showed necrotic myositis, pt also found to have acute osteomyelitis. Pt found to have symptomatic orthostatic hypotension during therapies 11/6-11/8, and consistently following week.  S/P I&D 11/11. 11/20, 12/3.  PMH: anemia, COPD, dementia, depression, DM, HLD, HTN, osteoporosis, schizo-affective, SOB, syncope.   OT comments  Patient supine in bed and agreeable to OT session.  Requires min guard for transfers and in room mobility using RW, intermittent cueing for RW mgmt and safety (ie standing at the sink after washing her hands she voices "no where do I put my hands?").  She is able to complete LB dressing with min assist, toileting with 1 LOB towards L during hygiene tasks and requires min assist to correct.  Pt aware of memory deficits, but poor insight to how deficits impact her safety/daily routines.  BP stable today, see below. Updated goals to focus on supervision level ADLs, memory strategies to optimize independence. Will follow acutely.  BP pre activity 124/77 BP post activity 132/77       If plan is discharge home, recommend the following:  A little help with walking and/or transfers;Assistance with cooking/housework;Direct supervision/assist for financial management;Direct supervision/assist for medications management;Assist for transportation;Help with stairs or ramp for entrance;A little help with bathing/dressing/bathroom   Equipment Recommendations  Other (comment) (defer)    Recommendations for Other Services      Precautions / Restrictions Precautions Precautions: Fall Precaution Comments: Contact; watch BP/HR Required Braces or Orthoses: Other Brace Other Brace: L "foot up" brace, TED hose, abdominal  binder Restrictions Weight Bearing Restrictions: No       Mobility Bed Mobility Overal bed mobility: Needs Assistance Bed Mobility: Supine to Sit     Supine to sit: Contact guard     General bed mobility comments: patient with poor sequencing and requires several cues to initate    Transfers Overall transfer level: Needs assistance Equipment used: Rolling walker (2 wheels) Transfers: Sit to/from Stand Sit to Stand: Contact guard assist           General transfer comment: Steadying assist     Balance Overall balance assessment: Needs assistance Sitting-balance support: No upper extremity supported, Feet supported Sitting balance-Leahy Scale: Good Sitting balance - Comments: statically with supervision   Standing balance support: Bilateral upper extremity supported, No upper extremity supported, During functional activity, Single extremity supported Standing balance-Leahy Scale: Poor Standing balance comment: relies on BUE support dynamically but able to engage in ADLs with 1 UE support and min guard; brief moment of 0 hand support during tolieting and lost balance towards L side with min assist to correct                           ADL either performed or assessed with clinical judgement   ADL Overall ADL's : Needs assistance/impaired     Grooming: Contact guard assist;Wash/dry hands;Standing               Lower Body Dressing: Minimal assistance;Sit to/from stand Lower Body Dressing Details (indicate cue type and reason): assist for L sock, able to don R sock at EOB.  Min guard in standing Toilet Transfer: Contact guard assist;Ambulation;Rolling walker (2 wheels) Toilet Transfer Details (indicate cue type  and reason): into bathroom, cueing for RW safety Toileting- Clothing Manipulation and Hygiene: Minimal assistance;Sit to/from stand Toileting - Clothing Manipulation Details (indicate cue type and reason): pt with loss of balance towards L side  while completing hygiene with min assist to correct     Functional mobility during ADLs: Contact guard assist;Rolling walker (2 wheels);Cueing for safety      Extremity/Trunk Assessment              Vision       Perception     Praxis      Cognition Arousal: Alert Behavior During Therapy: Flat affect Overall Cognitive Status: History of cognitive impairments - at baseline                                 General Comments: pt oriented, but pleasanlty confused.  Initally reports procedure was yesterday but then states she is going to surgery today.  She requires cueing for safety throughout session. Pt voices awareness of memory "issues" during session.        Exercises      Shoulder Instructions       General Comments BP EOB 124/77 and post activity 132/77    Pertinent Vitals/ Pain       Pain Assessment Pain Assessment: Faces Faces Pain Scale: Hurts little more Pain Location: L LE Pain Descriptors / Indicators: Grimacing, Discomfort Pain Intervention(s): Limited activity within patient's tolerance, Monitored during session, Repositioned  Home Living                                          Prior Functioning/Environment              Frequency  Min 1X/week        Progress Toward Goals  OT Goals(current goals can now be found in the care plan section)  Progress towards OT goals: Goals drowngraded-see care plan  Acute Rehab OT Goals Patient Stated Goal: rehab OT Goal Formulation: With patient Time For Goal Achievement: 11/01/23 Potential to Achieve Goals: Good ADL Goals Pt Will Perform Grooming: with supervision;standing Pt Will Perform Lower Body Dressing: with supervision;sit to/from stand;with adaptive equipment Pt Will Transfer to Toilet: with supervision;ambulating Pt Will Perform Toileting - Clothing Manipulation and hygiene: with supervision;sit to/from stand;sitting/lateral leans Additional ADL Goal #1: Pt  will utilize compensatory memory strategies to optimize recall for ADL and iADL routine. Additional ADL Goal #2: Pt will verbalize 3 fall prevention techniques to optimize safety and independence during ADLs.  Plan      Co-evaluation                 AM-PAC OT "6 Clicks" Daily Activity     Outcome Measure   Help from another person eating meals?: A Little Help from another person taking care of personal grooming?: A Little Help from another person toileting, which includes using toliet, bedpan, or urinal?: A Little Help from another person bathing (including washing, rinsing, drying)?: A Little Help from another person to put on and taking off regular upper body clothing?: A Little Help from another person to put on and taking off regular lower body clothing?: A Little 6 Click Score: 18    End of Session Equipment Utilized During Treatment: Gait belt;Rolling walker (2 wheels)  OT Visit Diagnosis: Other abnormalities of gait and mobility (  R26.89);Muscle weakness (generalized) (M62.81);Pain;Other symptoms and signs involving cognitive function Pain - Right/Left: Left Pain - part of body: Leg   Activity Tolerance Patient tolerated treatment well   Patient Left in chair;with call bell/phone within reach;with chair alarm set   Nurse Communication Mobility status        Time: 6578-4696 OT Time Calculation (min): 21 min  Charges: OT General Charges $OT Visit: 1 Visit OT Treatments $Self Care/Home Management : 8-22 mins  Barry Brunner, OT Acute Rehabilitation Services Office [phone]   Chancy Milroy 10/18/2023, 1:49 PM

## 2023-10-19 DIAGNOSIS — L89224 Pressure ulcer of left hip, stage 4: Secondary | ICD-10-CM | POA: Diagnosis not present

## 2023-10-19 NOTE — Assessment & Plan Note (Addendum)
Disposition was pending APS case worker placement, but was evaluated by Palliative Care and determined to have capacity.  Stated DNR status and consented to transfer to SNF.  Will likely need to spend down given her assets to remain in LTC. -TOC to follow-up placement

## 2023-10-19 NOTE — Assessment & Plan Note (Addendum)
Left thigh wound 2/2 crush injury now s/p I&D on 11/11, 11/20, 12/3.  Wound is gradually healing. -ID following, appreciate recommendations -Continue Meropenem, Daptomycin via PICC until 12/23 per ID -Discontinuing gentamicin ointment twice daily with dressing changes -Pain management: Scheduled tylenol 650mg  Q6h, PRN oxycodone 2.5-5 mg Q4h -Weekly labs on Fridays -Wound cultures NGTD @2  days -Plastic surgery following, possible debridement or wound VAC if patient still here next week

## 2023-10-19 NOTE — Progress Notes (Signed)
Physical Therapy Treatment Patient Details Name: Elizabeth Schwartz MRN: [id number] DOB: February 09, 1946 Today's Date: 10/19/2023   History of Present Illness Pt is a 77 y/o F admitted on 09/17/23 after being advised by [facility] nurse to go to the ED after assessing pt's wound. CT showed necrotic myositis, pt also found to have acute osteomyelitis. Pt found to have symptomatic orthostatic hypotension during therapies 11/6-11/8, and consistently following week.  S/P I&D 11/11. 11/20, 12/3.  PMH: anemia, COPD, dementia, depression, DM, HLD, HTN, osteoporosis, schizo-affective, SOB, syncope.    PT Comments  Patient is agreeable to PT session. Gait training continued with Min A required for steadying assistance for short distance ambulation in the room. Activity tolerance limited by fatigue. Mild LLE pain is reported with mobility. Continue to recommend rehabilitation <3 hours/day after this hospital stay. PT will continue to follow.    If plan is discharge home, recommend the following: A little help with bathing/dressing/bathroom;Assistance with cooking/housework;Supervision due to cognitive status;Direct supervision/assist for financial management;Assist for transportation;Help with stairs or ramp for entrance;Direct supervision/assist for medications management;A lot of help with walking and/or transfers   Can travel by private vehicle     Yes  Equipment Recommendations  Rolling walker (2 wheels);BSC/3in1;Wheelchair (measurements PT);Wheelchair cushion (measurements PT)    Recommendations for Other Services       Precautions / Restrictions Precautions Precautions: Fall Precaution Comments: Contact; watch BP/HR Required Braces or Orthoses: Other Brace Other Brace: L "foot up" brace, TED hose, abdominal binder Restrictions Weight Bearing Restrictions: No     Mobility  Bed Mobility Overal bed mobility: Needs Assistance Bed Mobility: Supine to Sit, Sit to Supine     Supine to sit: Contact guard,  HOB elevated Sit to supine: Contact guard assist, HOB elevated   General bed mobility comments: increased time required. 2 bouts of bed mobility performed (as patient needed to go to the bathroom after getting back to bed the first time). cues for task initiation    Transfers Overall transfer level: Needs assistance Equipment used: Rolling walker (2 wheels) Transfers: Sit to/from Stand Sit to Stand: Contact guard assist           General transfer comment: CGA for standing x 2 bouts from bed and on/off commode. cues for hand placement for safety    Ambulation/Gait Ambulation/Gait assistance: Min assist Gait Distance (Feet): 20 Feet (x 2 bouts) Assistive device: Rolling walker (2 wheels) Gait Pattern/deviations: Step-to pattern, Trunk flexed, Decreased dorsiflexion - left, Decreased weight shift to left, Decreased step length - left, Antalgic Gait velocity: decreased     General Gait Details: foot up brace used LLE. cues for using rolling walker for support and to off load LLE as needed for comfort secondary to discomfort. reinforced use of rolling walker for safety and fall prevention   Stairs             Wheelchair Mobility     Tilt Bed    Modified Rankin (Stroke Patients Only)       Balance Overall balance assessment: Needs assistance Sitting-balance support: No upper extremity supported, Feet supported Sitting balance-Leahy Scale: Good     Standing balance support: Bilateral upper extremity supported, No upper extremity supported, During functional activity, Single extremity supported Standing balance-Leahy Scale: Poor Standing balance comment: heavy relinace on RW for support in standing                            Cognition Arousal: Alert  Behavior During Therapy: Flat affect Overall Cognitive Status: History of cognitive impairments - at baseline                                 General Comments: patient was able to follow  single step commands with increased time        Exercises      General Comments General comments (skin integrity, edema, etc.): blood pressure 146/70 prior to mobility and 161/72 after mobility. abdominal binder used for all OOB mobility with mild dizziness reported just with initial stand. patient had a bowel movement and required maximal assistance for pericare      Pertinent Vitals/Pain Pain Assessment Pain Assessment: Faces Faces Pain Scale: Hurts little more Pain Location: L LE Pain Descriptors / Indicators: Numbness, Discomfort Pain Intervention(s): Limited activity within patient's tolerance, Monitored during session    Home Living                          Prior Function            PT Goals (current goals can now be found in the care plan section) Acute Rehab PT Goals Patient Stated Goal: to go to rehab today PT Goal Formulation: With patient Time For Goal Achievement: 10/30/23 Potential to Achieve Goals: Fair Progress towards PT goals: Progressing toward goals    Frequency    Min 1X/week      PT Plan      Co-evaluation              AM-PAC PT "6 Clicks" Mobility   Outcome Measure  Help needed turning from your back to your side while in a flat bed without using bedrails?: A Little Help needed moving from lying on your back to sitting on the side of a flat bed without using bedrails?: A Little Help needed moving to and from a bed to a chair (including a wheelchair)?: A Little Help needed standing up from a chair using your arms (e.g., wheelchair or bedside chair)?: A Little Help needed to walk in hospital room?: A Little Help needed climbing 3-5 steps with a railing? : Total 6 Click Score: 16    End of Session   Activity Tolerance: Patient tolerated treatment well Patient left: in bed;with call bell/phone within reach;with bed alarm set Nurse Communication: Mobility status PT Visit Diagnosis: Muscle weakness (generalized)  (M62.81);Pain;Other abnormalities of gait and mobility (R26.89);Difficulty in walking, not elsewhere classified (R26.2)     Time: 2440-1027 PT Time Calculation (min) (ACUTE ONLY): 21 min  Charges:    $Therapeutic Activity: 8-22 mins PT General Charges $$ ACUTE PT VISIT: 1 Visit                     Donna Bernard, PT, MPT    Ina Homes 10/19/2023, 10:22 AM

## 2023-10-19 NOTE — TOC Progression Note (Addendum)
Transition of Care [facility]) - Progression Note    Patient Details  Name: Elizabeth Schwartz MRN: [id number] Date of Birth: 1946-09-23  Transition of Care [facility]) CM/SW Contact  Lorri Frederick, LCSW Phone Number: 10/19/2023, 9:53 AM  Clinical Narrative:   CSW message from [facility].  They can offer bed, still need insurance auth.  CSW spoke with pt regarding SNF choice of [facility].  Pt asked for the physical location and said she does want to move forward with [facility].   1045: SNF auth request submitted in Interior.   1535: auth still pending in Navi  Expected Discharge Plan: Skilled Nursing Facility Barriers to Discharge: SNF Pending bed offer  Expected Discharge Plan and Services In-house Referral: Clinical Social Work   Post Acute Care Choice: Skilled Nursing Facility Living arrangements for the past 2 months: Single Family Home                                       Social Determinants of Health (SDOH) Interventions SDOH Screenings   Food Insecurity: No Food Insecurity (09/18/2023)  Housing: Patient Unable To Answer (09/18/2023)  Transportation Needs: No Transportation Needs (09/18/2023)  Utilities: Not At Risk (09/18/2023)  Alcohol Screen: Low Risk  (03/22/2023)  Depression (PHQ2-9): Low Risk  (07/13/2023)  Financial Resource Strain: Low Risk  (03/22/2023)  Physical Activity: Insufficiently Active (03/22/2023)  Social Connections: Socially Isolated (03/22/2023)  Stress: No Stress Concern Present (03/22/2023)  Tobacco Use: Medium Risk (10/17/2023)    Readmission Risk Interventions     No data to display

## 2023-10-19 NOTE — Plan of Care (Signed)
  Problem: Education: Goal: Ability to describe self-care measures that may prevent or decrease complications (Diabetes Survival Skills Education) will improve Outcome: Progressing   Problem: Coping: Goal: Ability to adjust to condition or change in health will improve Outcome: Progressing   Problem: Health Behavior/Discharge Planning: Goal: Ability to manage health-related needs will improve Outcome: Progressing   

## 2023-10-19 NOTE — Assessment & Plan Note (Addendum)
Hemoglobin stable at 8.6.  Likely chronic disease, some blood loss following repeat debridements. -Follow up outpatient
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
## 2023-10-19 NOTE — Assessment & Plan Note (Signed)
 Early acute osteomyelitis found on CT. IV abx treatment plan as above.

## 2023-10-20 DIAGNOSIS — L89224 Pressure ulcer of left hip, stage 4: Secondary | ICD-10-CM | POA: Diagnosis not present

## 2023-10-20 LAB — BASIC METABOLIC PANEL
Anion gap: 8 (ref 5–15)
BUN: 22 mg/dL (ref 8–23)
CO2: 24 mmol/L (ref 22–32)
Calcium: 8.7 mg/dL — ABNORMAL LOW (ref 8.9–10.3)
Chloride: 109 mmol/L (ref 98–111)
Creatinine, Ser: 0.68 mg/dL (ref 0.44–1.00)
GFR, Estimated: >60 mL/min (ref >60)
Glucose, Bld: 92 mg/dL (ref 70–99)
Potassium: 3.6 mmol/L (ref 3.5–5.1)
Sodium: 141 mmol/L (ref 135–145)

## 2023-10-20 LAB — CBC
HCT: 26.8 % — ABNORMAL LOW (ref 36.0–46.0)
Hemoglobin: 8.3 g/dL — ABNORMAL LOW (ref 12.0–15.0)
MCH: 27.9 pg (ref 26.0–34.0)
MCHC: 31 g/dL (ref 30.0–36.0)
MCV: 90.2 fL (ref 80.0–100.0)
Platelets: 312 10*3/uL (ref 150–400)
RBC: 2.97 MIL/uL — ABNORMAL LOW (ref 3.87–5.11)
RDW: 18.6 % — ABNORMAL HIGH (ref 11.5–15.5)
WBC: 7 10*3/uL (ref 4.0–10.5)
nRBC: 0 % (ref 0.0–0.2)

## 2023-10-20 MED ORDER — SODIUM CHLORIDE 0.9 % IV SOLN
3.0000 g | Freq: Three times a day (TID) | INTRAVENOUS | Status: DC
  Administered 2023-10-20 – 2023-11-01 (×37): 3 g via INTRAVENOUS
  Filled 2023-10-20 (×41): qty 22.8
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
## 2023-10-20 NOTE — Anesthesia Postprocedure Evaluation (Signed)
Anesthesia Post Note  Patient: Elizabeth Schwartz  Procedure(s) Performed: left thigh wound evaluation, irrigation and debridement left thigh wound (Left)     Patient location during evaluation: PACU Anesthesia Type: General Level of consciousness: awake and alert Pain management: pain level controlled Vital Signs Assessment: post-procedure vital signs reviewed and stable Respiratory status: spontaneous breathing, nonlabored ventilation and respiratory function stable Cardiovascular status: blood pressure returned to baseline and stable Postop Assessment: no apparent nausea or vomiting Anesthetic complications: no   No notable events documented.                Sourish Allender A.

## 2023-10-20 NOTE — Progress Notes (Signed)
RE:  Elizabeth Schwartz       Date of Birth: 2046/08/12      Date:   10/20/23       To Whom It May Concern:  Please be advised that the above-named patient has a primary diagnosis of dementia which supersedes any psychiatric diagnosis.                 MD signature                Date

## 2023-10-20 NOTE — Progress Notes (Signed)
Daily Progress Note Intern Pager: [phone]  Patient name: Elizabeth Schwartz Medical record number: [id number] Date of birth: 01-03-1946 Age: 77 y.o. Gender: female  Primary Care Provider: Storm Frisk, MD Consultants: Plastic Surgery, ID Code Status: DNR/DNI  Pt Overview and Major Events to Date:  11/3: Admitted to FMTS 11/11: I&D L thigh 11/17: Meropenem started 11/20: Left thigh wound evaluation under anesthesia w/ debridement 11/24: Daptomycin added due to MRSA in deep wound culture 11/26: PICC placed 12/03: Return to OR with Dr. Ladona Ridgel for debridement 12/06: Cultures growing Pseudomonas, switched meropenem/daptomycin to Zyrbaxa   Assessment and Plan: Elizabeth Schwartz is a 77 y.o. female with a pertinent PMH of schizophrenia, dementia, COPD, and osteoporosis who presented with left thigh wound in setting of crush injury and was admitted for necrotizing myositis and osteomyelitis, currently s/p multiple debridements with plastics and with repeat wound cultures growing resistant Pseudomonas.  Wound cultures have now grown difficult to treat Pseudomonas, resistant to carbapenem.  Now switched to Zyrbaxa per ID.  Unfortunately, TOC noted that SNF will now likely not accept patient due to need for Zyrbaxa and associated expense.  Dr. Ladona Ridgel with Plastics believes there is no further role for debridement or other procedure and will continue to follow without further surgery planned at this time. Assessment & Plan Necrotizing myositis Left thigh wound 2/2 crush injury now s/p I&D on 11/11, 11/20, 12/3.  Now growing Pseudomonas in wound cultures with concern for carbapenem resistance, thus switched to Zyrbaxa per ID pending sensitivities.  Plastic surgery stating that there is no other benefit for debridement at this time.  Will continue to follow, but no other procedures planned. -ID following, appreciate recommendations -Switch antibiotics to Zyrbaxa until 12/23 per  ID -Discontinuing gentamicin ointment twice daily with dressing changes -Pain management: Scheduled tylenol 650mg  Q6h, PRN oxycodone 2.5-5 mg Q4h -Weekly labs on Fridays -Wound cultures NGTD @2  days -Plastic surgery following, possible debridement or wound VAC if patient still here next week Goals of care, counseling/discussion Disposition was pending APS case worker placement, but was evaluated by Palliative Care and determined to have capacity.  Stated DNR status and consented to transfer to SNF.  Will likely need to spend down given her assets to remain in LTC. -TOC to follow-up placement Osteomyelitis of left femur (HCC) Early acute osteomyelitis found on CT.  IV abx treatment plan as above. Anemia Hemoglobin stable at 8.3.  Likely chronic disease, some blood loss following repeat debridements. -Follow up outpatient  Chronic and Stable Problems: None  FEN/GI: Regular PPx: Enoxaparin Dispo: SNF pending completion of antibiotics, wound culture sensitivities.  Subjective:  This morning, patient states she feels well, reports mild pain overnight but nothing this morning.  No other concerns at this time.  No overnight events.  Objective: Temp:  [97.6 F (36.4 C)-98.5 F (36.9 C)] 97.6 F (36.4 C) (12/06 0517) Pulse Rate:  [76-81] 81 (12/06 0517) Resp:  [16-18] 17 (12/06 0517) BP: (139-160)/(63-69) 160/69 (12/06 0517) SpO2:  [95 %-99 %] 99 % (12/06 0517)  Physical Exam: General: Elderly female, deconditioned, resting comfortably in bed, NAD, alert to self, situation, place. Cardiovascular: Regular rate and rhythm. Normal S1/S2. No murmurs, rubs, or gallops appreciated. 2+ radial pulses. Abdominal: Normoactive bowel sounds, nondistended. No tenderness to deep or light palpation. No rebound or guarding. No HSM. Skin: Warm and dry.  Left thigh wound wrapped in dressing, dry, no drainage, erythema.  Appropriately tender to palpation. Extremities: No peripheral edema  bilaterally.  Laboratory: Most recent CBC Lab Results  Component Value Date   WBC 7.0 10/20/2023   HGB 8.3 (L) 10/20/2023   HCT 26.8 (L) 10/20/2023   MCV 90.2 10/20/2023   PLT 312 10/20/2023   Most recent BMP    Latest Ref Rng & Units 10/20/2023    3:42 AM  BMP  Glucose 70 - 99 mg/dL 92   BUN 8 - 23 mg/dL 22   Creatinine 1.61 - 1.00 mg/dL 0.96   Sodium 045 - 409 mmol/L 141   Potassium 3.5 - 5.1 mmol/L 3.6   Chloride 98 - 111 mmol/L 109   CO2 22 - 32 mmol/L 24   Calcium 8.9 - 10.3 mg/dL 8.7     Other pertinent labs: -L thigh wound cultures: Few Pseudomonas, sensitivities pending  New Imaging/Diagnostic Tests: -None  Krisinda Giovanni, MD 10/20/2023, 7:53 AM  PGY-1, [place] [facility] FPTS Intern pager: [phone], text pages welcome Secure chat group [facility] Teaching Service

## 2023-10-20 NOTE — Plan of Care (Signed)
  Problem: Education: Goal: Ability to describe self-care measures that may prevent or decrease complications (Diabetes Survival Skills Education) will improve Outcome: Progressing   Problem: Coping: Goal: Ability to adjust to condition or change in health will improve Outcome: Progressing   Problem: Nutritional: Goal: Maintenance of adequate nutrition will improve Outcome: Progressing   Problem: Nutritional: Goal: Progress toward achieving an optimal weight will improve Outcome: Progressing   Problem: Education: Goal: Knowledge of General Education information will improve Description: Including pain rating scale, medication(s)/side effects and non-pharmacologic comfort measures Outcome: Progressing   Problem: Activity: Goal: Risk for activity intolerance will decrease Outcome: Progressing   Problem: Pain Management: Goal: General experience of comfort will improve Outcome: Progressing

## 2023-10-20 NOTE — [place] FL2 (Signed)
[place] MEDICAID FL2 LEVEL OF CARE FORM     IDENTIFICATION  Patient Name: Elizabeth Schwartz Birthdate: 1946/11/01 Sex: female Admission Date (Current Location): 09/17/2023  [facility] and [place] Number:  Producer, television/film/video and Address:  [facility] [place], [address] [place], [place] [address]      Provider Number: [id number]  Attending Physician Name and Address:  Billey Co, MD  Relative Name and Phone Number:  Berg,[name] Niece   [phone]    Current Level of Care: Hospital Recommended Level of Care: Skilled Nursing Facility Prior Approval Number:    Date Approved/Denied:   PASRR Number:    Discharge Plan: SNF    Current Diagnoses: Patient Active Problem List   Diagnosis Date Noted   Anemia 10/01/2023   Type 2 diabetes mellitus without complication (HCC) 09/30/2023   Skin ulcer of left great toe (HCC) 09/28/2023   Left leg swelling 09/28/2023   Goals of care, counseling/discussion 09/25/2023   Necrotizing myositis 09/23/2023   Diabetes mellitus without complication (HCC) 09/23/2023   Dementia (HCC) 09/23/2023   Long term current use of clozapine 09/18/2023   Myositis of left thigh 09/18/2023   Osteomyelitis of left femur (HCC) 09/18/2023   Pressure injury of left thigh, stage 4 (HCC) 09/18/2023   Pressure injury of deep tissue of left thigh 09/17/2023   Rhabdomyolysis 08/10/2023   AKI (acute kidney injury) (HCC) 08/10/2023   Transaminitis 08/10/2023   CAP (community acquired pneumonia) 08/10/2023   Lactic acidosis 08/10/2023   Elevated troponin 08/10/2023   Pressure injury of skin with suspected deep tissue injury 08/10/2023   Porokeratosis 01/02/2023   Dementia due to Alzheimer's disease (HCC) 08/11/2022   Statin myopathy 04/21/2022   Pain due to onychomycosis of toenails of both feet 02/22/2022   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) 11/22/2021   Chronic pain of right hip 09/15/2021   Chronic pain of left knee  09/15/2021   Chronic left shoulder pain 05/24/2021   Mixed hyperlipidemia 03/22/2021   Pincer nail deformity 11/03/2020   Chronic diarrhea 03/20/2019   Hypertension 08/22/2016   Normocytic anemia 08/22/2016   Major neurocognitive disorder due to multiple etiologies with behavioral disturbance (HCC) 08/22/2016   Osteoporosis 08/22/2016   Asthma, moderate persistent 09/08/2011   Diverticulosis 09/08/2011   Former smoker 09/08/2011   GERD 06/04/2007    Orientation RESPIRATION BLADDER Height & Weight     Self, Time, Situation, Place  Normal Incontinent Weight: 152 lb (68.9 kg) Height:  5\' 5"  (165.1 cm)  BEHAVIORAL SYMPTOMS/MOOD NEUROLOGICAL BOWEL NUTRITION STATUS      Incontinent Diet (see discharge summary)  AMBULATORY STATUS COMMUNICATION OF NEEDS Skin   Limited Assist Verbally Other (Comment) (wound on left thigh)                       Personal Care Assistance Level of Assistance  Bathing, Feeding, Dressing Bathing Assistance: Limited assistance Feeding assistance: Limited assistance Dressing Assistance: Limited assistance     Functional Limitations Info  Sight, Hearing, Speech Sight Info: Adequate Hearing Info: Adequate Speech Info: Adequate    SPECIAL CARE FACTORS FREQUENCY  PT (By licensed PT), OT (By licensed OT)     PT Frequency: 5x week OT Frequency: 5x week            Contractures Contractures Info: Not present    Additional Factors Info  Code Status, Allergies, Insulin Sliding Scale Code Status Info: full Allergies Info: crestor  Insulin Sliding Scale Info: Novolog: see discharge summary       Current Medications (10/20/2023):  This is the current hospital active medication list Current Facility-Administered Medications  Medication Dose Route Frequency Provider Last Rate Last Admin   acetaminophen (TYLENOL) tablet 650 mg  650 mg Oral Q6H Erick Alley, DO   650 mg at 10/20/23 0533   Or   acetaminophen (TYLENOL) suppository 650 mg  650 mg  Rectal Q6H Erick Alley, DO   650 mg at 09/22/23 0052   albuterol (PROVENTIL) (2.5 MG/3ML) 0.083% nebulizer solution 2.5 mg  2.5 mg Inhalation Q6H PRN Erick Alley, DO       ascorbic acid (VITAMIN C) tablet 250 mg  250 mg Oral BID McDiarmid, Leighton Roach, MD   250 mg at 10/20/23 0843   ceftolozane-tazobactam (ZERBAXA) 3 g in sodium chloride 0.9 % 100 mL IVPB  3 g Intravenous Q8H Comer, Belia Heman, MD       Chlorhexidine Gluconate Cloth 2 % PADS 6 each  6 each Topical Daily Latrelle Dodrill, MD   6 each at 10/20/23 0844   DAPTOmycin (CUBICIN) IVPB 700 mg/114mL premix  700 mg Intravenous Q1400 Daiva Eves, Lisette Grinder, MD   Stopped at 10/19/23 1428   donepezil (ARICEPT) tablet 10 mg  10 mg Oral QHS Erick Alley, DO   10 mg at 10/19/23 2102   enoxaparin (LOVENOX) injection 40 mg  40 mg Subcutaneous Q24H Baloch, Mahnoor, MD   40 mg at 10/19/23 1715   ferrous sulfate tablet 325 mg  325 mg Oral Elenora Fender, DO   325 mg at 10/20/23 0844   memantine (NAMENDA) tablet 5 mg  5 mg Oral BID Erick Alley, DO   5 mg at 10/20/23 8657   mometasone-formoterol (DULERA) 200-5 MCG/ACT inhaler 2 puff  2 puff Inhalation BID Erick Alley, DO   2 puff at 10/20/23 0844   multivitamin with minerals tablet 1 tablet  1 tablet Oral Daily McDiarmid, Leighton Roach, MD   1 tablet at 10/20/23 8469   nutrition supplement (JUVEN) (JUVEN) powder packet 1 packet  1 packet Oral BID BM Carney Living, MD   1 packet at 10/20/23 6295   oxyCODONE (Oxy IR/ROXICODONE) immediate release tablet 2.5 mg  2.5 mg Oral Q4H PRN Darnelle Spangle B, MD   2.5 mg at 10/19/23 2102   sodium chloride flush (NS) 0.9 % injection 10 mL  10 mL Intravenous Q12H Anders Simmonds T, DO   10 mL at 10/20/23 0845   sodium chloride flush (NS) 0.9 % injection 10-40 mL  10-40 mL Intracatheter Q12H Latrelle Dodrill, MD   10 mL at 10/19/23 2108   sodium chloride flush (NS) 0.9 % injection 10-40 mL  10-40 mL Intracatheter PRN Latrelle Dodrill, MD       white petrolatum  (VASELINE) gel   Topical BID Lorayne Bender, MD   Given at 10/18/23 2331     Discharge Medications: Please see discharge summary for a list of discharge medications.  Relevant Imaging Results:  Relevant Lab Results:   Additional Information SSN: [id number]. IV abx in place  Bethannie Iglehart Jon, LCSW

## 2023-10-20 NOTE — Assessment & Plan Note (Signed)
 Early acute osteomyelitis found on CT. IV abx treatment plan as above.

## 2023-10-20 NOTE — Assessment & Plan Note (Addendum)
Left thigh wound 2/2 crush injury now s/p I&D on 11/11, 11/20, 12/3.  Now growing Pseudomonas in wound cultures with concern for carbapenem resistance, thus switched to Zyrbaxa per ID pending sensitivities.  Plastic surgery stating that there is no other benefit for debridement at this time.  Will continue to follow, but no other procedures planned. -ID following, appreciate recommendations -Switch antibiotics to Zyrbaxa until 12/23 per ID -Discontinuing gentamicin ointment twice daily with dressing changes -Pain management: Scheduled tylenol 650mg  Q6h, PRN oxycodone 2.5-5 mg Q4h -Weekly labs on Fridays -Wound cultures NGTD @2  days -Plastic surgery following, possible debridement or wound VAC if patient still here next week

## 2023-10-20 NOTE — Progress Notes (Signed)
Physical Therapy Treatment Patient Details Name: Elizabeth Schwartz MRN: [id number] DOB: Feb 12, 1946 Today's Date: 10/20/2023   History of Present Illness Pt is a 77 y/o F admitted on 09/17/23 after being advised by [facility] nurse to go to the ED after assessing pt's wound. CT showed necrotic myositis, pt also found to have acute osteomyelitis. Pt found to have symptomatic orthostatic hypotension during therapies 11/6-11/8, and consistently following week.  S/P I&D 11/11. 11/20, 12/3.  PMH: anemia, COPD, dementia, depression, DM, HLD, HTN, osteoporosis, schizo-affective, SOB, syncope.    PT Comments  Pt received in chair after hygiene assist from nursing staff, pt agreeable to therapy session with emphasis on transfer and gait training with RW support. Pt given close VS monitoring and AFO and abdominal binder/RLE TED hose donned, with stable BP pre/post standing from chair. Pt c/o moderate to severe pain from rest to gait training, RN notified, also tachy to 148 bpm with exertion, RN/MD notified. Did not assess fully supine BP but had been more elevated per chart review in AM so pt may still be somewhat orthostatic. Chair follow for safety with pt able to progress gait distance to ~19ft prior to c/o fatigue, pain and significantly elevated HR. Pt will continue to benefit from skilled rehab in a post acute setting <3 hours per day to maximize functional gains before returning home.     If plan is discharge home, recommend the following: A little help with bathing/dressing/bathroom;Assistance with cooking/housework;Supervision due to cognitive status;Direct supervision/assist for financial management;Assist for transportation;Help with stairs or ramp for entrance;Direct supervision/assist for medications management;A lot of help with walking and/or transfers   Can travel by private vehicle     Yes  Equipment Recommendations  Rolling walker (2 wheels);BSC/3in1;Wheelchair (measurements PT);Wheelchair cushion  (measurements PT)    Recommendations for Other Services       Precautions / Restrictions Precautions Precautions: Fall Precaution Comments: Contact; watch BP/HR Required Braces or Orthoses: Other Brace Other Brace: L "foot up" brace, TED hose, abdominal binder Restrictions Weight Bearing Restrictions: No     Mobility  Bed Mobility Overal bed mobility: Needs Assistance             General bed mobility comments: pt received sitting EOB with RN assisting with hygiene, pt bed soiled    Transfers Overall transfer level: Needs assistance Equipment used: Rolling walker (2 wheels) Transfers: Sit to/from Stand Sit to Stand: Contact guard assist, Min assist           General transfer comment: CGA for sit>standing from recliner to RW, but needs minA and multimodal cues for stand>sit and proper hand placement due to slow processing and increased confusion after gait activity. Pt takes ~20-30 seconds to perform stand>sit despite c/o fatigue and dense cues to sit, pt stepping forward when cued to step backward to sit.    Ambulation/Gait Ambulation/Gait assistance: Min assist, +2 safety/equipment Gait Distance (Feet): 52 Feet (2ft, seated break, 72ft) Assistive device: Rolling walker (2 wheels) Gait Pattern/deviations: Step-to pattern, Trunk flexed, Decreased dorsiflexion - left, Decreased weight shift to left, Decreased step length - left, Antalgic, Drifts right/left Gait velocity: decreased     General Gait Details: foot up brace used LLE. cues for using rolling walker for support and to off load LLE as needed for comfort secondary to discomfort. Pt needing assist to advance RW and dense cues for increased step length for energy conservation; frequent check-ins for activity tolerance and close VS monitoring, pt tachy with exertion, MD/RN notified. No c/o dizziness  but slower processing and c/o weakness as she fatigues.   Stairs Stairs:  (defer due to pt fatigue and LLE pain)            Wheelchair Mobility     Tilt Bed    Modified Rankin (Stroke Patients Only)       Balance Overall balance assessment: Needs assistance Sitting-balance support: No upper extremity supported, Feet supported Sitting balance-Leahy Scale: Good     Standing balance support: Bilateral upper extremity supported, No upper extremity supported, During functional activity, Single extremity supported Standing balance-Leahy Scale: Poor Standing balance comment: heavy reliance on RW for support in standing                            Cognition Arousal: Alert Behavior During Therapy: Flat affect Overall Cognitive Status: History of cognitive impairments - at baseline                                 General Comments: patient was able to follow single step commands with increased time, more confusion/slower processing after prolonged standing activity, anticipate pt is still slightly orthostatic as supine BP in AM was much higher than seated/standing BP that were recorded during session and pt was very responsive/no delay when resting but verbal/motor processing was slowed during standing/functional activity.        Exercises Other Exercises Other Exercises: seated BLE AROM: LAQ x10 reps ea x2 sets with cues to slow her speed Other Exercises: ankle pumps x10 reps ea (No visible DF ROM on LLE)    General Comments General comments (skin integrity, edema, etc.): see VS above; BP stable with sit>stand      Pertinent Vitals/Pain Pain Assessment Pain Assessment: PAINAD Faces Pain Scale: Hurts even more Breathing: normal Negative Vocalization: occasional moan/groan, low speech, negative/disapproving quality Facial Expression: facial grimacing Body Language: tense, distressed pacing, fidgeting Consolability: distracted or reassured by voice/touch PAINAD Score: 5 Pain Location: L LE (thigh mostly and some at ankle/foot), intermittent shooting/cramping pain  up to 10/10 per pt Pain Descriptors / Indicators: Discomfort, Grimacing, Guarding, Sharp, Shooting Pain Intervention(s): Limited activity within patient's tolerance, Monitored during session, Repositioned, Patient requesting pain meds-RN notified (pt defers ice pack for LLE)    Home Living                          Prior Function            PT Goals (current goals can now be found in the care plan section) Acute Rehab PT Goals Patient Stated Goal: to go to rehab, less pain PT Goal Formulation: With patient Time For Goal Achievement: 10/30/23 Progress towards PT goals: Progressing toward goals    Frequency    Min 1X/week      PT Plan      Co-evaluation              AM-PAC PT "6 Clicks" Mobility   Outcome Measure  Help needed turning from your back to your side while in a flat bed without using bedrails?: A Little Help needed moving from lying on your back to sitting on the side of a flat bed without using bedrails?: A Little Help needed moving to and from a bed to a chair (including a wheelchair)?: A Little Help needed standing up from a chair using your arms (e.g., wheelchair or  bedside chair)?: A Little Help needed to walk in hospital room?: A Lot (chair follow due to fatigue) Help needed climbing 3-5 steps with a railing? : Total 6 Click Score: 15    End of Session Equipment Utilized During Treatment: Gait belt;Other (comment) (LLE foot up brace (AFO), abdominal binder, RLE TED hose) Activity Tolerance: Patient tolerated treatment well;Patient limited by pain;Other (comment);Treatment limited secondary to medical complications (Comment) (tachycardia) Patient left: with call bell/phone within reach;in chair;with chair alarm set;Other (comment) (pt able to demo back call bell use) Nurse Communication: Mobility status;Patient requests pain meds;Other (comment) (MD/RN notified of pt exertional tachycardia) PT Visit Diagnosis: Muscle weakness (generalized)  (M62.81);Pain;Other abnormalities of gait and mobility (R26.89);Difficulty in walking, not elsewhere classified (R26.2) Pain - Right/Left: Left Pain - part of body: Leg (thigh wound)     Time: 1121-1201 PT Time Calculation (min) (ACUTE ONLY): 40 min  Charges:    $Gait Training: 23-37 mins $Therapeutic Activity: 8-22 mins PT General Charges $$ ACUTE PT VISIT: 1 Visit                     Arnitra Sokoloski P., PTA Acute Rehabilitation Services Secure Chat Preferred 9a-5:30pm Office: [phone]    Dorathy Kinsman [facility] 10/20/2023, 12:23 PM

## 2023-10-20 NOTE — Progress Notes (Signed)
RE:   Elizabeth Schwartz      Date of Birth:  May 03, 2046     Date:   10/20/23       To Whom It May Concern:  Please be advised that the above-named patient will require a short-term nursing home stay - anticipated 30 days or less for rehabilitation and strengthening.  The plan is for return home.                 MD signature                Date

## 2023-10-20 NOTE — Progress Notes (Signed)
    [facility]   Reason for visit: Follow up on necrotic myositis  Interval History: recent OR culture growing Pseudomonas again and concern for development of further resistance.  WBC wnl  Physical Exam: Constitutional:  Vitals:   10/20/23 1130 10/20/23 1200  BP: 127/77 132/62  Pulse: (!) 115 61  Resp:    Temp:    SpO2:     patient appears in NAD Respiratory: Normal respiratory effort  Review of Systems: Constitutional: negative for fevers and chills  Lab Results  Component Value Date   WBC 7.0 10/20/2023   HGB 8.3 (L) 10/20/2023   HCT 26.8 (L) 10/20/2023   MCV 90.2 10/20/2023   PLT 312 10/20/2023    Lab Results  Component Value Date   CREATININE 0.68 10/20/2023   BUN 22 10/20/2023   NA 141 10/20/2023   K 3.6 10/20/2023   CL 109 10/20/2023   CO2 24 10/20/2023    Lab Results  Component Value Date   ALT 15 09/20/2023   AST 17 09/20/2023   ALKPHOS 39 09/20/2023     Microbiology: Recent Results (from the past 240 hour(s))  Aerobic/Anaerobic Culture w Gram Stain (surgical/deep wound)     Status: None (Preliminary result)   Collection Time: 10/17/23  4:26 PM   Specimen: Path Tissue  Result Value Ref Range Status   Specimen Description TISSUE LEFT THIGH  Final   Special Requests PT ON MEROPENEM  Final   Gram Stain   Final    NO WBC SEEN NO ORGANISMS SEEN Performed at [facility], [address] [place], [place] [address]    Culture   Final    RARE PSEUDOMONAS AERUGINOSA CONFIRMATION OF SUSCEPTIBILITIES IN PROGRESS NO ANAEROBES ISOLATED; CULTURE IN PROGRESS FOR 5 DAYS    Report Status PENDING  Incomplete  Aerobic/Anaerobic Culture w Gram Stain (surgical/deep wound)     Status: None (Preliminary result)   Collection Time: 10/17/23  4:32 PM   Specimen: Path fluid; Body Fluid  Result Value Ref Range Status   Specimen Description WOUND LEFT THIGH  Final   Special Requests PT ON MEROPENEM  Final   Gram Stain NO WBC  SEEN NO ORGANISMS SEEN   Final   Culture   Final    NO GROWTH 3 DAYS NO ANAEROBES ISOLATED; CULTURE IN PROGRESS FOR 5 DAYS Performed at [facility], [address], [place], [place] [address]    Report Status PENDING  Incomplete    Impression/Plan:  1. Necrotizing myositis of the left thigh - growth noted with Pseudomonas in cultures and discussed with micro and concern for carbapenem resistance. Have changed antibiotics to zyrbaxa pending sensitivities Continuing daptomycin Continue antibiotics until 12/23  2.  Acute osteomyelitis - noted on CT and remains on antibiotics for above.    3.  Access - has picc line  Dr. Drue Second available over the weekend and will monitor sensitivities and adjust as indicated

## 2023-10-20 NOTE — Assessment & Plan Note (Signed)
Hemoglobin stable at 8.3.  Likely chronic disease, some blood loss following repeat debridements. -Follow up outpatient

## 2023-10-20 NOTE — TOC Progression Note (Signed)
Transition of Care [facility]) - Progression Note    Patient Details  Name: Elizabeth Schwartz MRN: [id number] Date of Birth: 1946-08-13  Transition of Care [facility]) CM/SW Contact  Lorri Frederick, LCSW Phone Number: 10/20/2023, 10:24 AM  Clinical Narrative:   SNF auth approved: [id number], [id number], 5 days: 12/6-12/10.  Message from [facility], [place] expires tomorrow, need new one.  New screening submitted, does need additional info again uploaded.  Message from Pharmacy: IV abx may be adjusted based on culture results currently pending.  SNF informed and does want to wait to admit until IV abx plan is determined.    Expected Discharge Plan: Skilled Nursing Facility Barriers to Discharge: SNF Pending bed offer  Expected Discharge Plan and Services In-house Referral: Clinical Social Work   Post Acute Care Choice: Skilled Nursing Facility Living arrangements for the past 2 months: Single Family Home                                       Social Determinants of Health (SDOH) Interventions SDOH Screenings   Food Insecurity: No Food Insecurity (09/18/2023)  Housing: Patient Unable To Answer (09/18/2023)  Transportation Needs: No Transportation Needs (09/18/2023)  Utilities: Not At Risk (09/18/2023)  Alcohol Screen: Low Risk  (03/22/2023)  Depression (PHQ2-9): Low Risk  (07/13/2023)  Financial Resource Strain: Low Risk  (03/22/2023)  Physical Activity: Insufficiently Active (03/22/2023)  Social Connections: Socially Isolated (03/22/2023)  Stress: No Stress Concern Present (03/22/2023)  Tobacco Use: Medium Risk (10/17/2023)    Readmission Risk Interventions     No data to display

## 2023-10-20 NOTE — Assessment & Plan Note (Signed)
 Disposition was pending APS case worker placement, but was evaluated by Palliative Care and determined to have capacity.  Stated DNR status and consented to transfer to SNF.  Will likely need to spend down given her assets to remain in LTC. -TOC to follow-up placement

## 2023-10-20 NOTE — Progress Notes (Signed)
Nutrition Follow-up  DOCUMENTATION CODES:   Not applicable  INTERVENTION:   -Continue regular diet, encourage intakes, protein focused snacks BID.  -Continue Ensure Enlive po BID, each supplement provides 350 kcal and 20 grams of protein.  -Continue -1 packet Juven BID, each packet provides 95 calories, 2.5 grams of protein (collagen), and 9.8 grams of carbohydrate (3 grams sugar); also contains 7 grams of L-arginine and L-glutamine, 300 mg vitamin C, 15 mg vitamin E, 1.2 mcg vitamin B-12, 9.5 mg zinc, 200 mg calcium, and 1.5 g  Calcium Beta-hydroxy-Beta-methylbutyrate to support wound healing.  -Continue MVI/Minerals-1 Tab daily, vitamin C 250 mg BID to support wound healing.    NUTRITION DIAGNOSIS:   Increased nutrient needs related to wound healing as evidenced by estimated needs.  -addressing with meals, oral supplements  GOAL:   Patient will meet greater than or equal to 90% of their needs  -progressing  MONITOR:   PO intake, Weight trends, Skin, Labs  REASON FOR ASSESSMENT:   Follow up  ASSESSMENT:   77 y/o female presented with left thigh wound after a crush injury last month. Imaging showed evidenced of necrotizing myositis and acute early osteomyelitis.  Recent hospitalization on 9/25-10/4/24  after a stove fell on her leg causing pressure injury of the left thigh.  She was found by neighbors and had rhabdo. Underwent SNF discharge after hospital stay and was recently discharged.  PMH: DM2, HTN, HLD, osteoporosis, COPD, anemia, schizophrenia, dementia.Admmited 11/3  11/11- underwent debridement of wound, cultures growing Pseudomonas, E faecalis and Bacteroides 11/20- Taken to the OR for debridement Pseudomonas aeruginosa, MRSA.  There was necrotic tissue next to femur and medial aspect of wound 12/3-debridement  Intakes recorded average 69% x 8 meals. Per patient, she is taking the Ensure and Juven. She is sitting up in recliner and is pleasant and is  conversing. Notes that she does like the meals provided. Reviewed importance of eating well in the hospital to support wound healing. Weight down 5.9 kg from initial, question accuracy of admit weight. Edema 2+ LLE.  Medications reviewed and include vitamin C 250 mg 2x daily, ferrous sulfate 325 mg every other day, MVI/Minerals-1 Tab daily.  Labs reviewed     Diet Order:   Diet Order             Diet regular Room service appropriate? Yes; Fluid consistency: Thin  Diet effective now                   EDUCATION NEEDS:   Education needs have been addressed  Skin:  Skin Assessment: Reviewed RN Assessment Skin Integrity Issues:: Incisions, Other (Comment) Incisions: L thigh, L leg Other: non-pressure wound to L thigh; abrasion to L toe; blanchable/redness to R hip  Last BM:  10/19/23, type 5-medium  Height:   Ht Readings from Last 1 Encounters:  10/17/23 5\' 5"  (1.651 m)    Weight:   Wt Readings from Last 1 Encounters:  10/17/23 68.9 kg    Ideal Body Weight:  59.1 kg  BMI:  Body mass index is 25.29 kg/m.  Estimated Nutritional Needs:   Kcal:  1650-1900 kcals/day  Protein:  77-89 gm/day  Fluid:  1650-1900 mL/day    Alvino Chapel, RDLD Clinical Dietitian If unable to reach, please contact "[facility] Inpatient" secure chat group between 8 am-4 pm daily"

## 2023-10-21 DIAGNOSIS — L89224 Pressure ulcer of left hip, stage 4: Secondary | ICD-10-CM | POA: Diagnosis not present

## 2023-10-21 LAB — GLUCOSE, CAPILLARY: Glucose-Capillary: 102 mg/dL — ABNORMAL HIGH (ref 70–99)

## 2023-10-21 MED ORDER — OXYCODONE HCL 5 MG PO TABS
5.0000 mg | ORAL_TABLET | Freq: Four times a day (QID) | ORAL | Status: DC | PRN
  Administered 2023-10-21 – 2023-11-01 (×23): 5 mg via ORAL
  Filled 2023-10-21 (×23): qty 1

## 2023-10-21 NOTE — Progress Notes (Signed)
Daily Progress Note Intern Pager: [phone]  Patient name: Elizabeth Schwartz Medical record number: [id number] Date of birth: 07-Jun-1946 Age: 77 y.o. Gender: female  Primary Care Provider: Storm Frisk, MD Consultants: Plastic surgery, ID Code Status: DNR  Pt Overview and Major Events to Date:  11/3: Admitted to [facility]MT[facility] 11/11: I&D L thigh 11/17: Meropenem started 11/20: Left thigh wound evaluation under anesthesia w/ debridement 11/24: Daptomycin added due to MRSA in deep wound culture 11/26: PICC placed 12/03: Return to OR with Dr. Ladona Ridgel (plastic surgery) for debridement 12/06: Cultures growing Pseudomonas, switched meropenem/daptomycin to Zerbaxa  Assessment and Plan:  Elizabeth Schwartz 77 yo female PMH schizophrenia, dementia, COPD, and osteoporosis who presented with left thigh wound in setting of crush injury and admitted for necrotizing myositis and osteomyelitis, currently has had multiple debridements with plastics and with repeat cultures growing resistant pseudomonas.  Assessment & Plan Necrotizing myositis Left thigh wound from a crush injury now s/p I&D on 11/11, 11/20, 12/3.  Now growing Pseudomonas in wound cultures with concern for carbapenem resistance, thus switched to Zerbaxa per ID pending sensitivities.  No further benefit of debridement at this time per plastic surgery.   Antibiotic history: Zosyn 11/3-11/14 Vancomycin 11/3 Linezolid 11/4-11/15 Meropenem 11/14-12/6 Gentamicin TP 11/30-12/4 Daptomycin 11/24- Zerbaxa 12/6-  -ID following, appreciate recommendations -Plastic surgery following, possible debridement or wound VAC if patient still here next week -Continue Zerbaxa until 12/23 per ID -Pain management: Scheduled tylenol 650mg  Q6h, increase oxycodone to 5mg  q6h prn -Dressing change every shift -Weekly labs (BMP, CBC, CK) on Fridays Goals of care, counseling/discussion Disposition was pending APS case worker placement, but was evaluated by  Palliative Care and determined to have capacity.  Stated DNR status and consented to transfer to SNF.  Will likely need to spend down given her assets to remain in LTC. -TOC to follow-up placement Osteomyelitis of left femur (HCC) Early acute osteomyelitis found on CT.  IV abx treatment plan as above. Anemia Hemoglobin stable at 8.3.  Likely chronic disease, some blood loss following repeat debridements. -Follow on CBC  Chronic and Stable Issues: Schizophrenia: Clozapine held Dementia: Donepezil 10 mg nightly, memantine 5 mg twice daily COPD: Dulera twice daily Osteoporosis: Calcium and vitamin D held  FEN/GI: regular PPx: lovenox Dispo: SNF pending antibiotics  Subjective:  Patient states that she has been having intermittent toe pain bilaterally.  Otherwise doing okay.  Objective: Temp:  [98.2 F (36.8 C)-98.4 F (36.9 C)] 98.4 F (36.9 C) (12/07 2027) Pulse Rate:  [63-85] 80 (12/07 2027) Resp:  [15-18] 18 (12/07 2027) BP: (134-161)/(51-70) 134/51 (12/07 2027) SpO2:  [94 %-98 %] 94 % (12/07 2027) Physical Exam: General: NAD, awake, alert, responsive to all questions Cardiovascular: RRR, no murmurs rubs or gallops Respiratory: Clear to auscultation bilaterally, no wheezes rales or crackles Abdomen: Soft nontender to palpation, nondistended Extremities: No lower extremity edema, no toe ulceration or signs of infection small hematoma on the left third toe but otherwise well-perfused without pain to palpation  Laboratory: Most recent CBC Lab Results  Component Value Date   WBC 7.0 10/20/2023   HGB 8.3 (L) 10/20/2023   HCT 26.8 (L) 10/20/2023   MCV 90.2 10/20/2023   PLT 312 10/20/2023   Most recent BMP    Latest Ref Rng & Units 10/20/2023    3:42 AM  BMP  Glucose 70 - 99 mg/dL 92   BUN 8 - 23 mg/dL 22   Creatinine 1.47 - 1.00 mg/dL 8.29  Sodium 135 - 145 mmol/L 141   Potassium 3.5 - 5.1 mmol/L 3.6   Chloride 98 - 111 mmol/L 109   CO2 22 - 32 mmol/L 24   Calcium  8.9 - 10.3 mg/dL 8.7     Levin Erp, MD 10/21/2023, 11:48 PM  PGY-3,  Family Medicine FPTS Intern pager: [phone], text pages welcome Secure chat group [facility] Teaching Service

## 2023-10-21 NOTE — Assessment & Plan Note (Addendum)
Left thigh wound 2/2 crush injury now s/p I&D on 11/11, 11/20, 12/3.  Now growing Pseudomonas in wound cultures with concern for carbapenem resistance, thus switched to Zerbaxa per ID pending sensitivities.  No further benefit of debridement at this time per plastic surgery.  Antibiotic history: Zosyn 11/3-11/14 Vancomycin 11/3 Linezolid 11/4-11/15 Meropenem 11/14-12/6 Gentamicin TP 11/30-12/4 Daptomycin 11/24- Zerbaxa 12/6-  -ID following, appreciate recommendations -Plastic surgery following, possible debridement or wound VAC if patient still here next week -Continue Zerbaxa until 12/23 per ID -Pain management: Scheduled tylenol 650mg  Q6h, increase oxycodone to 5mg  q6h prn -Dressing change every shift -Weekly labs (BMP, CBC, CK) on Fridays

## 2023-10-21 NOTE — Plan of Care (Addendum)
Spoke with Dr. Drue Second from ID as patients wound culture sensitivities resulted. Patient is currently on daptomycin and Zerbaxa started yesterday  concern for carbapenem resistance. Dr. Drue Second recommended continuing patient on current regimen. Will plan to continue for now. Appreciate ID for their recommendations. Susceptibility below:  Left Thigh Tissue Culture   NO WBC SEEN NO ORGANISMS SEEN   Culture RARE PSEUDOMONAS AERUGINOSA MULTI-DRUG RESISTANT ORGANISM NO ANAEROBES ISOLATED; CULTURE IN PROGRESS FOR 5 DAYS CRITICAL RESULT CALLED TO, READ BACK BY AND VERIFIED WITH: RN Jorge Mandril [id number] AT 1320 BY EC Performed at [facility], [address] [place], [place] [address]  Report Status PENDING  Organism ID, Bacteria PSEUDOMONAS AERUGINOSA  Resulting Agency [facility]     Susceptibility    Pseudomonas aeruginosa    MIC    CEFEPIME >=32 RESIST... Resistant    CEFTAZIDIME >=64 RESIST... Resistant    CIPROFLOXACIN 1 INTERMEDI... Intermediate    GENTAMICIN <=1 SENSITIVE Sensitive    IMIPENEM >=16 RESIST... Resistant    PIP/TAZO >=128 RESIS... Resistant        Hal Morales, MD 10/21/2023 2:54 PM PGY-1, [facility] Family Medicine FPTS Intern pager: [phone], text pages welcome Secure chat group [facility] Teaching Service

## 2023-10-21 NOTE — Progress Notes (Signed)
Daily Progress Note Intern Pager: [phone]  Patient name: Elizabeth Schwartz Medical record number: [id number] Date of birth: August 09, 1946 Age: 77 y.o. Gender: female  Primary Care Provider: Storm Frisk, MD Consultants: Plastic surgery, ID Code Status: DNR  Pt Overview and Major Events to Date:  11/3: Admitted to FMTS 11/11: I&D L thigh 11/17: Meropenem started 11/20: Left thigh wound evaluation under anesthesia w/ debridement 11/24: Daptomycin added due to MRSA in deep wound culture 11/26: PICC placed 12/03: Return to OR with Dr. Ladona Ridgel (plastic surgery) for debridement 12/06: Cultures growing Pseudomonas, switched meropenem/daptomycin to Zerbaxa  Assessment and Plan: Elizabeth Schwartz is a 77 y.o. female with a pertinent PMH of schizophrenia, dementia, COPD, and osteoporosis who presented with left thigh wound in setting of crush injury and was admitted for necrotizing myositis and osteomyelitis, currently s/p multiple debridements with plastics and with repeat wound cultures growing resistant Pseudomonas.  Assessment & Plan Necrotizing myositis Left thigh wound 2/2 crush injury now s/p I&D on 11/11, 11/20, 12/3.  Now growing Pseudomonas in wound cultures with concern for carbapenem resistance, thus switched to Zerbaxa per ID pending sensitivities.  No further benefit of debridement at this time per plastic surgery.  Antibiotic history: Zosyn 11/3-11/14 Vancomycin 11/3 Linezolid 11/4-11/15 Meropenem 11/14-12/6 Gentamicin TP 11/30-12/4 Daptomycin 11/24- Zerbaxa 12/6-  -ID following, appreciate recommendations -Plastic surgery following, possible debridement or wound VAC if patient still here next week -Continue Zerbaxa until 12/23 per ID -Pain management: Scheduled tylenol 650mg  Q6h, increase oxycodone to 5mg  q6h prn -Dressing change every shift -Weekly labs (BMP, CBC, CK) on Fridays Goals of care, counseling/discussion Disposition was pending APS case worker  placement, but was evaluated by Palliative Care and determined to have capacity.  Stated DNR status and consented to transfer to SNF.  Will likely need to spend down given her assets to remain in LTC. -TOC to follow-up placement Osteomyelitis of left femur (HCC) Early acute osteomyelitis found on CT.  IV abx treatment plan as above. Anemia Hemoglobin stable at 8.3.  Likely chronic disease, some blood loss following repeat debridements. -Follow up outpatient  Chronic and Stable Issues: Schizophrenia: Clozapine held Dementia: Donepezil 10 mg nightly, memantine 5 mg twice daily COPD: Dulera twice daily Osteoporosis: Calcium and vitamin D held  FEN/GI: Regular PPx: Lovenox Dispo:SNF  pending completion of antibiotics .   Subjective:  She notes pain has been increased recently. RN reports current pain regimen has not been satisfactory overnight.   Objective: Temp:  [98.2 F (36.8 C)-99.9 F (37.7 C)] 98.2 F (36.8 C) (12/07 0525) Pulse Rate:  [57-115] 73 (12/07 0525) Resp:  [16-17] 17 (12/07 0525) BP: (127-154)/(62-91) 146/70 (12/07 0525) SpO2:  [97 %-98 %] 98 % (12/07 0525) Physical Exam: General: well-appearing, NAD Cardiovascular: RRR, no murmurs auscultated Respiratory: CTAB, normal WOB Extremities: LLE wrapped at surgical site  Laboratory: Most recent CBC Lab Results  Component Value Date   WBC 7.0 10/20/2023   HGB 8.3 (L) 10/20/2023   HCT 26.8 (L) 10/20/2023   MCV 90.2 10/20/2023   PLT 312 10/20/2023   Most recent BMP    Latest Ref Rng & Units 10/20/2023    3:42 AM  BMP  Glucose 70 - 99 mg/dL 92   BUN 8 - 23 mg/dL 22   Creatinine 1.47 - 1.00 mg/dL 8.29   Sodium 562 - 130 mmol/L 141   Potassium 3.5 - 5.1 mmol/L 3.6   Chloride 98 - 111 mmol/L 109   CO2 22 -  32 mmol/L 24   Calcium 8.9 - 10.3 mg/dL 8.7    Imaging/Diagnostic Tests: No recent imaging results.  [name], DO 10/21/2023, 7:36 AM PGY-3, [facility] FPTS Intern pager:  [phone], text pages welcome Secure chat group [facility] Teaching Service

## 2023-10-21 NOTE — Assessment & Plan Note (Addendum)
 Early acute osteomyelitis found on CT. IV abx treatment plan as above.

## 2023-10-21 NOTE — Plan of Care (Signed)
  Problem: Coping: Goal: Level of anxiety will decrease Outcome: Progressing   Problem: Pain Management: Goal: General experience of comfort will improve Outcome: Progressing   Problem: Nutritional: Goal: Maintenance of adequate nutrition will improve Outcome: Not Progressing   Problem: Skin Integrity: Goal: Risk for impaired skin integrity will decrease Outcome: Not Progressing   Problem: Nutrition: Goal: Adequate nutrition will be maintained Outcome: Not Progressing

## 2023-10-21 NOTE — Assessment & Plan Note (Addendum)
Hemoglobin stable at 8.3.  Likely chronic disease, some blood loss following repeat debridements. -Follow up outpatient

## 2023-10-21 NOTE — Assessment & Plan Note (Addendum)
 Disposition was pending APS case worker placement, but was evaluated by Palliative Care and determined to have capacity.  Stated DNR status and consented to transfer to SNF.  Will likely need to spend down given her assets to remain in LTC. -TOC to follow-up placement

## 2023-10-22 DIAGNOSIS — L89224 Pressure ulcer of left hip, stage 4: Secondary | ICD-10-CM | POA: Diagnosis not present

## 2023-10-22 LAB — AEROBIC/ANAEROBIC CULTURE W GRAM STAIN (SURGICAL/DEEP WOUND): Gram Stain: NONE SEEN

## 2023-10-22 LAB — CK: Total CK: 64 U/L (ref 38–234)

## 2023-10-22 MED ORDER — CLOZAPINE 100 MG PO TABS
200.0000 mg | ORAL_TABLET | Freq: Every day | ORAL | Status: DC
  Administered 2023-10-22 – 2023-10-31 (×10): 200 mg via ORAL
  Filled 2023-10-22 (×11): qty 2

## 2023-10-22 NOTE — Assessment & Plan Note (Signed)
 Disposition was pending APS case worker placement, but was evaluated by Palliative Care and determined to have capacity.  Stated DNR status and consented to transfer to SNF.  Will likely need to spend down given her assets to remain in LTC. -TOC to follow-up placement

## 2023-10-22 NOTE — Assessment & Plan Note (Signed)
 Early acute osteomyelitis found on CT. IV abx treatment plan as above.

## 2023-10-22 NOTE — Progress Notes (Signed)
ID PROGRESS NOTE  Continue on daptomycin for MRSA coverage and ceftolozane-tazobactam for Carbapenem R- PsA. Her PsA isolates are showing increased drug resistance. At this time, we will defer doing double coverage for PsA and avoid aminoglycoside. If she continues to worsen, we can reconsider abtx regimen. For now, this regimen should work.  10/17/23 tissue cx       Pseudomonas aeruginosa      MIC    CEFEPIME >=32 RESIST... Resistant    CEFTAZIDIME >=64 RESIST... Resistant    CIPROFLOXACIN 1 INTERMEDI... Intermediate    GENTAMICIN <=1 SENSITIVE Sensitive    IMIPENEM >=16 RESIST... Resistant    PIP/TAZO >=128 RESIS... Resistant    11/20 tissue cx Organism ID, Bacteria PSEUDOMONAS AERUGINOSA  Organism ID, Bacteria METHICILLIN RESISTANT STAPHYLOCOCCUS AUREUS  Resulting Agency [facility]     Susceptibility    Pseudomonas aeruginosa Methicillin resistant staphylococcus aureus    MIC MIC    CEFEPIME >=32 RESIST... Resistant      CEFTAZIDIME >=64 RESIST... Resistant      CIPROFLOXACIN 1 INTERMEDI... Intermediate >=8 RESISTANT Resistant    CLINDAMYCIN   >=8 RESISTANT Resistant    ERYTHROMYCIN   >=8 RESISTANT Resistant    GENTAMICIN 2 SENSITIVE Sensitive <=0.5 SENSI... Sensitive    IMIPENEM 1 SENSITIVE Sensitive      Inducible Clindamycin   NEGATIVE Sensitive    LINEZOLID   2 SENSITIVE Sensitive    OXACILLIN   >=4 RESISTANT Resistant    PIP/TAZO >=128 RESIS... Resistant      RIFAMPIN   <=0.5 SENSI... Sensitive    TETRACYCLINE   >=16 RESIST... Resistant    TRIMETH/SULFA   <=10 SENSIT... Sensitive    VANCOMYCIN   <=0.5 SENSI... Sensitive

## 2023-10-22 NOTE — Assessment & Plan Note (Signed)
Hemoglobin stable at 8.3.  Likely chronic disease, some blood loss following repeat debridements. -Follow on CBC

## 2023-10-22 NOTE — Plan of Care (Signed)
  Problem: Coping: Goal: Ability to adjust to condition or change in health will improve Outcome: Progressing   Problem: Education: Goal: Knowledge of General Education information will improve Description: Including pain rating scale, medication(s)/side effects and non-pharmacologic comfort measures Outcome: Progressing   

## 2023-10-22 NOTE — Assessment & Plan Note (Signed)
Left thigh wound from a crush injury now s/p I&D on 11/11, 11/20, 12/3.  Now growing Pseudomonas in wound cultures with concern for carbapenem resistance, thus switched to Zerbaxa per ID pending sensitivities.  No further benefit of debridement at this time per plastic surgery.   Antibiotic history: Zosyn 11/3-11/14 Vancomycin 11/3 Linezolid 11/4-11/15 Meropenem 11/14-12/6 Gentamicin TP 11/30-12/4 Daptomycin 11/24- Zerbaxa 12/6-  -ID following, appreciate recommendations -Plastic surgery following, possible debridement or wound VAC if patient still here next week -Continue Zerbaxa until 12/23 per ID -Pain management: Scheduled tylenol 650mg  Q6h, increase oxycodone to 5mg  q6h prn -Dressing change every shift -Weekly labs (BMP, CBC, CK) on Fridays

## 2023-10-23 DIAGNOSIS — L89224 Pressure ulcer of left hip, stage 4: Secondary | ICD-10-CM | POA: Diagnosis not present

## 2023-10-23 NOTE — Assessment & Plan Note (Signed)
Hemoglobin stable at 8.3.  Likely chronic disease, some blood loss following repeat debridements. -Follow on CBC

## 2023-10-23 NOTE — Progress Notes (Signed)
Daily Progress Note Intern Pager: [phone]  Patient name: Elizabeth Schwartz Medical record number: [id number] Date of birth: 1946/10/23 Age: 77 y.o. Gender: female  Primary Care Provider: Storm Frisk, MD Consultants: Plastic surgery, ID Code Status: DNR/DNI  Pt Overview and Major Events to Date:  11/3: Admitted to FMTS 11/11: I&D L thigh 11/17: Meropenem started 11/20: Left thigh wound evaluation under anesthesia w/ debridement 11/24: Daptomycin added due to MRSA in deep wound culture 11/26: PICC placed 12/03: Return to OR with Dr. Ladona Ridgel (plastic surgery) for debridement 12/06: Cultures growing Pseudomonas, switched meropenem/daptomycin to Zerbax   Assessment and Plan: Elizabeth Schwartz is a 78 y.o. female with a pertinent PMH of schizophrenia, dementia, COPD, and osteoporosis who presented with left thigh wound in setting of crush injury and was admitted for necrotizing myositis and osteomyelitis, currently s/p multiple debridements with plastics and with repeat wound cultures growing resistant Pseudomonas.  Cannot go to SNF due to cost of current Zerbaxa antibiotic and awaiting completion of treatment for placement as of now.  Per Plastic Surgery, no benefit to further debridement at this time and will follow up outpatient.  Will follow up on PT plans during this prolonged hospitalization tomorrow. Assessment & Plan Necrotizing myositis Left thigh wound from a crush injury now s/p I&D on 11/11, 11/20, 12/3.  Now growing Pseudomonas in wound cultures with concern for carbapenem resistance, thus switched to Zerbaxa per ID pending sensitivities.  No further benefit of debridement at this time per plastic surgery, will follow up outpatient after discharge.  Antibiotic history: Zosyn 11/3-11/14 Vancomycin 11/3 Linezolid 11/4-11/15 Meropenem 11/14-12/6 Gentamicin TP 11/30-12/4 Daptomycin 11/24-? Zerbaxa 12/6-?  -ID following, appreciate recommendations -Plastic surgery  following, possible debridement or wound VAC if patient still here next week -Continue Zerbaxa until 12/23 per ID for now -Pain management: Scheduled tylenol 650mg  Q6h, increase oxycodone to 5mg  q6h prn -Dressing change every shift -Weekly labs (BMP, CBC, CK) on Fridays Goals of care, counseling/discussion Disposition was pending APS case worker placement, but was evaluated by Palliative Care and determined to have capacity.  Stated DNR status and consented to transfer to SNF.  Will likely need to spend down given her assets to remain in LTC. -TOC to follow-up placement Osteomyelitis of left femur (HCC) Early acute osteomyelitis found on CT.  IV abx treatment plan as above. Anemia Hemoglobin stable at 8.3.  Likely chronic disease, some blood loss following repeat debridements. -Follow on CBC  Chronic and Stable Problems: None  FEN/GI: Regular PPx: Enoxaparin Dispo: SNF pending completion of antibiotics, wound culture sensitivities.  Subjective:  Doing well this morning.  Continues to report surgical site/wound site pain overnight and with movement.  No acute events overnight, no other changes.  Objective: Temp:  [97.7 F (36.5 C)-98.7 F (37.1 C)] 97.7 F (36.5 C) (12/09 0838) Pulse Rate:  [72-84] 72 (12/09 0838) Resp:  [16-20] 17 (12/09 0838) BP: (120-153)/(54-71) 137/62 (12/09 0838) SpO2:  [95 %-100 %] 95 % (12/09 2956)  Physical Exam: General: Frail elderly female, deconditioned, resting comfortably in bed, NAD, alert and at baseline. Cardiovascular: Regular rate and rhythm. Normal S1/S2. No murmurs, rubs, or gallops appreciated. 2+ radial pulses. Pulmonary: Clear bilaterally to ascultation. No increased WOB, no accessory muscle usage on room air. No wheezes, rales, or crackles. Abdominal: Normoactive bowel sounds, nondistended. No tenderness to deep or light palpation. No rebound or guarding. Extremities: No peripheral edema bilaterally.  L thigh wound with dry dressing,  not draining.  Appropriately  tender.  2+ L DP and PT pulses.  Laboratory: Most recent CBC Lab Results  Component Value Date   WBC 7.0 10/20/2023   HGB 8.3 (L) 10/20/2023   HCT 26.8 (L) 10/20/2023   MCV 90.2 10/20/2023   PLT 312 10/20/2023   Most recent BMP    Latest Ref Rng & Units 10/20/2023    3:42 AM  BMP  Glucose 70 - 99 mg/dL 92   BUN 8 - 23 mg/dL 22   Creatinine 5.36 - 1.00 mg/dL 6.44   Sodium 034 - 742 mmol/L 141   Potassium 3.5 - 5.1 mmol/L 3.6   Chloride 98 - 111 mmol/L 109   CO2 22 - 32 mmol/L 24   Calcium 8.9 - 10.3 mg/dL 8.7     Other pertinent labs: -None  New Imaging/Diagnostic Tests: -None  Arieana Somoza, MD 10/23/2023, 8:54 AM  PGY-1, [facility] FPTS Intern pager: [phone], text pages welcome Secure chat group [facility] Teaching Service

## 2023-10-23 NOTE — TOC Progression Note (Signed)
Transition of Care [facility]) - Progression Note    Patient Details  Name: Elizabeth Schwartz MRN: [id number] Date of Birth: February 13, 1946  [facility]) CM/SW Contact  Lorri Frederick, LCSW Phone Number: 10/23/2023, 1:07 PM  Clinical Narrative:   CSW spoke with [facility] regarding current IV abx: they cannot accept pt with current regimen.  Per MD, no option for changes, pt will remain inpt until complete.  Tammy updated. TOC will continue to follow.   1545: TC Tammy/[facility]. She clarified: their pharmacy does not stock zerbaxa, but could potentially obtain it given some time.  She will look into this and see what is possible.    Tammy reports her business office also concerned about issues related to medicaid application and needed bank records--may need APS assistance with this since pt POA is not available.   Expected Discharge Plan: Skilled Nursing Facility Barriers to Discharge: SNF Pending bed offer  Expected Discharge Plan and Services In-house Referral: Clinical Social Work   Post Acute Care Choice: Skilled Nursing Facility Living arrangements for the past 2 months: Single Family Home                                       Social Determinants of Health (SDOH) Interventions SDOH Screenings   Food Insecurity: No Food Insecurity (09/18/2023)  Housing: Patient Unable To Answer (09/18/2023)  Transportation Needs: No Transportation Needs (09/18/2023)  Utilities: Not At Risk (09/18/2023)  Alcohol Screen: Low Risk  (03/22/2023)  Depression (PHQ2-9): Low Risk  (07/13/2023)  Financial Resource Strain: Low Risk  (03/22/2023)  Physical Activity: Insufficiently Active (03/22/2023)  Social Connections: Socially Isolated (03/22/2023)  Stress: No Stress Concern Present (03/22/2023)  Tobacco Use: Medium Risk (10/17/2023)    Readmission Risk Interventions     No data to display

## 2023-10-23 NOTE — Assessment & Plan Note (Signed)
 Early acute osteomyelitis found on CT. IV abx treatment plan as above.

## 2023-10-23 NOTE — Progress Notes (Signed)
[facility]  Date of Admission:  09/17/2023     Total days of antibiotics 36         ASSESSMENT:  Ms. Cogdell surgical cultures are showing multi-drug resistant Pseudomonas aeruginosa in the setting of osteomyelitis and necrotizing myositis of the left femur. Meropenem has been changed to Zerbaxa and will continue with current dose of Daptomycin. Appears to be tolerating antibiotics with no adverse side effects and most recent CK level 64. Continue therapeutic drug monitoring. No significant concern for condensation from tray and will continue with wound care per Plastic Surgery recommendations. Disposition appears to be to Skilled Nursing. Continue PICC line care per protocol. Remaining medical and supportive care per [facility] Medicine Team.   PLAN:  Continue current dose of Zerbaxa and Daptomycin.  Continue wound care per Plastic Surgery recommendations.  Therapeutic drug monitoring of CK levels while on daptomycin. Continue PICC line care per protocol.  Remaining medical and supportive care per [facility] Medicine Team.   Principal Problem:   Pressure injury of left thigh, stage 4 (HCC) Active Problems:   Normocytic anemia   Long term current use of clozapine   Myositis of left thigh   Osteomyelitis of left femur (HCC)   Necrotizing myositis   Diabetes mellitus without complication (HCC)   Dementia (HCC)   Goals of care, counseling/discussion   Skin ulcer of left great toe (HCC)   Left leg swelling   Type 2 diabetes mellitus without complication (HCC)   Anemia    acetaminophen  650 mg Oral Q6H   Or   acetaminophen  650 mg Rectal Q6H   ascorbic acid  250 mg Oral BID   Chlorhexidine Gluconate Cloth  6 each Topical Daily   clozapine  200 mg Oral QHS   donepezil  10 mg Oral QHS   enoxaparin (LOVENOX) injection  40 mg Subcutaneous Q24H   ferrous sulfate  325 mg Oral QODAY   memantine  5 mg Oral BID   mometasone-formoterol  2 puff Inhalation BID    multivitamin with minerals  1 tablet Oral Daily   nutrition supplement (JUVEN)  1 packet Oral BID BM   sodium chloride flush  10 mL Intravenous Q12H   sodium chloride flush  10-40 mL Intracatheter Q12H   white petrolatum   Topical BID    SUBJECTIVE:  Afebrile overnight with no acute events. Has concern about a drop of condensation from her food tray causing burning of her wound.   Allergies  Allergen Reactions   Crestor [Rosuvastatin] Other (See Comments)    Muscle aches.    Fish Allergy Other (See Comments)     Review of Systems: Review of Systems  Constitutional:  Negative for chills, fever and weight loss.  Respiratory:  Negative for cough, shortness of breath and wheezing.   Cardiovascular:  Negative for chest pain and leg swelling.  Gastrointestinal:  Negative for abdominal pain, constipation, diarrhea, nausea and vomiting.  Skin:  Negative for rash.      OBJECTIVE: Vitals:   10/23/23 0339 10/23/23 0756 10/23/23 0838 10/23/23 1459  BP: (!) 120/54  137/62 (!) 127/46  Pulse: 80 84 72 82  Resp: 20 16 17 16   Temp: 97.8 F (36.6 C)  97.7 F (36.5 C) 98.4 F (36.9 C)  TempSrc:   Oral Oral  SpO2: 96% 98% 95% 96%  Weight:      Height:       Body mass index is 25.29 kg/m.  Physical Exam  Constitutional:      General: She is not in acute distress.    Appearance: She is well-developed.  Cardiovascular:     Rate and Rhythm: Normal rate and regular rhythm.     Heart sounds: Normal heart sounds.  Pulmonary:     Effort: Pulmonary effort is normal.     Breath sounds: Normal breath sounds.  Skin:    General: Skin is warm and dry.  Neurological:     Mental Status: She is alert.     Lab Results Lab Results  Component Value Date   WBC 7.0 10/20/2023   HGB 8.3 (L) 10/20/2023   HCT 26.8 (L) 10/20/2023   MCV 90.2 10/20/2023   PLT 312 10/20/2023    Lab Results  Component Value Date   CREATININE 0.68 10/20/2023   BUN 22 10/20/2023   NA 141 10/20/2023   K 3.6  10/20/2023   CL 109 10/20/2023   CO2 24 10/20/2023    Lab Results  Component Value Date   ALT 15 09/20/2023   AST 17 09/20/2023   ALKPHOS 39 09/20/2023   BILITOT 0.4 09/20/2023     Microbiology: Recent Results (from the past 240 hour(s))  Aerobic/Anaerobic Culture w Gram Stain (surgical/deep wound)     Status: None (Preliminary result)   Collection Time: 10/17/23  4:26 PM   Specimen: Path Tissue  Result Value Ref Range Status   Specimen Description TISSUE LEFT THIGH  Final   Special Requests PT ON MEROPENEM  Final   Gram Stain NO WBC SEEN NO ORGANISMS SEEN   Final   Culture   Final    RARE PSEUDOMONAS AERUGINOSA MULTI-DRUG RESISTANT ORGANISM NO ANAEROBES ISOLATED CRITICAL RESULT CALLED TO, READ BACK BY AND VERIFIED WITH: RN Jorge Mandril [id number] AT 1320 BY EC Sent to [facility] for further susceptibility testing. Performed at [facility], [address], [place], [place] [address]    Report Status PENDING  Incomplete   Organism ID, Bacteria PSEUDOMONAS AERUGINOSA  Final      Susceptibility   Pseudomonas aeruginosa - MIC*    CEFTAZIDIME >=64 RESISTANT Resistant     CIPROFLOXACIN 1 INTERMEDIATE Intermediate     GENTAMICIN <=1 SENSITIVE Sensitive     IMIPENEM >=16 RESISTANT Resistant     PIP/TAZO >=128 RESISTANT Resistant ug/mL    CEFEPIME >=32 RESISTANT Resistant     * RARE PSEUDOMONAS AERUGINOSA  Aerobic/Anaerobic Culture w Gram Stain (surgical/deep wound)     Status: None   Collection Time: 10/17/23  4:32 PM   Specimen: Path fluid; Body Fluid  Result Value Ref Range Status   Specimen Description WOUND LEFT THIGH  Final   Special Requests PT ON MEROPENEM  Final   Gram Stain NO WBC SEEN NO ORGANISMS SEEN   Final   Culture   Final    RARE PSEUDOMONAS AERUGINOSA SUSCEPTIBILITIES PERFORMED ON PREVIOUS CULTURE WITHIN THE LAST 5 DAYS. NO ANAEROBES ISOLATED Performed at [facility], [address], [place], [place] [address]    Report Status 10/22/2023 FINAL   Final     Marcos Eke, NP [facility]  10/23/2023  3:28 PM

## 2023-10-23 NOTE — Assessment & Plan Note (Addendum)
Left thigh wound from a crush injury now s/p I&D on 11/11, 11/20, 12/3.  Now growing Pseudomonas in wound cultures with concern for carbapenem resistance, thus switched to Zerbaxa per ID pending sensitivities.  No further benefit of debridement at this time per plastic surgery, will follow up outpatient after discharge.  Antibiotic history: Zosyn 11/3-11/14 Vancomycin 11/3 Linezolid 11/4-11/15 Meropenem 11/14-12/6 Gentamicin TP 11/30-12/4 Daptomycin 11/24-? Zerbaxa 12/6-?  -ID following, appreciate recommendations -Plastic surgery following, possible debridement or wound VAC if patient still here next week -Continue Zerbaxa until 12/23 per ID for now -Pain management: Scheduled tylenol 650mg  Q6h, increase oxycodone to 5mg  q6h prn -Dressing change every shift -Weekly labs (BMP, CBC, CK) on Fridays

## 2023-10-23 NOTE — Assessment & Plan Note (Signed)
 Disposition was pending APS case worker placement, but was evaluated by Palliative Care and determined to have capacity.  Stated DNR status and consented to transfer to SNF.  Will likely need to spend down given her assets to remain in LTC. -TOC to follow-up placement

## 2023-10-24 ENCOUNTER — Encounter (HOSPITAL_COMMUNITY)
Admission: EM | Disposition: A | Discharge status: Skilled Nursing Facility | Payer: Self-pay | Source: Home / Self Care | Attending: Family Medicine

## 2023-10-24 DIAGNOSIS — L89224 Pressure ulcer of left hip, stage 4: Secondary | ICD-10-CM | POA: Diagnosis not present

## 2023-10-24 LAB — MIC RESULTS (3 DRUGS)

## 2023-10-24 LAB — BASIC METABOLIC PANEL
Anion gap: 10 (ref 5–15)
BUN: 30 mg/dL — ABNORMAL HIGH (ref 8–23)
CO2: 21 mmol/L — ABNORMAL LOW (ref 22–32)
Calcium: 9.3 mg/dL (ref 8.9–10.3)
Chloride: 110 mmol/L (ref 98–111)
Creatinine, Ser: 1.04 mg/dL — ABNORMAL HIGH (ref 0.44–1.00)
GFR, Estimated: 55 mL/min — ABNORMAL LOW (ref >60)
Glucose, Bld: 135 mg/dL — ABNORMAL HIGH (ref 70–99)
Potassium: 3.8 mmol/L (ref 3.5–5.1)
Sodium: 141 mmol/L (ref 135–145)

## 2023-10-24 LAB — MINIMUM INHIBITORY CONCENTRATION (3 DRUG)

## 2023-10-24 SURGERY — IRRIGATION AND DEBRIDEMENT WOUND
Anesthesia: Choice | Site: Leg Upper | Laterality: Left

## 2023-10-24 NOTE — Assessment & Plan Note (Signed)
Left thigh wound from a crush injury now s/p I&D on 11/11, 11/20, 12/3.  Now growing Pseudomonas in wound cultures with concern for carbapenem resistance, thus switched to Zerbaxa per ID pending sensitivities.  No further benefit of debridement at this time per plastic surgery, will follow up outpatient after discharge.  Antibiotic history: Zosyn 11/3-11/14 Vancomycin 11/3 Linezolid 11/4-11/15 Meropenem 11/14-12/6 Gentamicin TP 11/30-12/4 Daptomycin 11/24-? Zerbaxa 12/6-?  -Continue Zerbaxa until 12/23 per ID for now -Pain management: Scheduled tylenol 650mg  Q6h, increase oxycodone to 5mg  q6h prn -Dressing change every shift -Weekly labs (BMP, CBC, CK) on Fridays -Plastic surgery following, no further plan for debridements or wound VAC at this time, appreciate recommendations -ID following, appreciate recommendations

## 2023-10-24 NOTE — Assessment & Plan Note (Addendum)
Creatinine bump this morning, suspect in setting of poor p.o. intake. -Nursing care orders encouraged 250 mL PO Q4h -Bladder scan x1 -AM BMP, trend creatinine

## 2023-10-24 NOTE — Progress Notes (Signed)
Physical Therapy Treatment Patient Details Name: Elizabeth Schwartz MRN: [id number] DOB: 12/27/1945 Today's Date: 10/24/2023   History of Present Illness Pt is a 77 y/o F admitted on 09/17/23 after being advised by [facility] nurse to go to the ED after assessing pt's wound. CT showed necrotic myositis, pt also found to have acute osteomyelitis. Pt found to have symptomatic orthostatic hypotension during therapies 11/6-11/8, and consistently following week.  S/P I&D 11/11. 11/20, 12/3.  PMH: anemia, COPD, dementia, depression, DM, HLD, HTN, osteoporosis, schizo-affective, SOB, syncope.    PT Comments  Pt received in bed. ROM performed in supine and TED hose, abdominal binder, and L AFO donned before sitting up. Pt c/o dizziness in sitting and standing but BP taken and stable. Pt needed min A to stand from bed and pivot to [facility] with RW and mod vc's. Pt dependent for perineal care after urination/ BM. Pt ambulated short distance in room but continued to report dizziness as well as LLE pain and decreased ability to wt shift L because of this. Pt seated in chair end of session and assisted with further LE there ex. Patient will benefit from continued inpatient follow up therapy, <3 hours/day  PT will continue to follow.     If plan is discharge home, recommend the following: A little help with bathing/dressing/bathroom;Assistance with cooking/housework;Supervision due to cognitive status;Direct supervision/assist for financial management;Assist for transportation;Help with stairs or ramp for entrance;Direct supervision/assist for medications management;A lot of help with walking and/or transfers   Can travel by private vehicle     No  Equipment Recommendations  Rolling walker (2 wheels);BSC/3in1;Wheelchair (measurements PT);Wheelchair cushion (measurements PT)    Recommendations for Other Services       Precautions / Restrictions Precautions Precautions: Fall Precaution Comments: Contact; watch  BP/HR Required Braces or Orthoses: Other Brace Other Brace: L foot drop brace, TED hose, abdominal binder Restrictions Weight Bearing Restrictions: No     Mobility  Bed Mobility Overal bed mobility: Needs Assistance Bed Mobility: Supine to Sit     Supine to sit: Min assist     General bed mobility comments: min HHA to come to EOB    Transfers Overall transfer level: Needs assistance Equipment used: Rolling walker (2 wheels) Transfers: Sit to/from Stand, Bed to chair/wheelchair/BSC Sit to Stand: Min assist   Step pivot transfers: Min assist       General transfer comment: Pt attempted to stand pushing with B hands from bed but was unable. With 1 hand on bed and 1 on RW she was able to stand with min A for fwd translation of wt. CGA to stand from [facility]. Min A needed to step feet from bed to [facility], needed mod vc's for sequencing    Ambulation/Gait Ambulation/Gait assistance: Min assist Gait Distance (Feet): 10 Feet Assistive device: Rolling walker (2 wheels) Gait Pattern/deviations: Step-to pattern, Trunk flexed, Decreased dorsiflexion - left, Decreased weight shift to left, Decreased step length - left, Antalgic, Drifts right/left Gait velocity: decreased Gait velocity interpretation: <1.31 ft/sec, indicative of household ambulator   General Gait Details: pt reported dizziness throughout ambulation and had difficulty with L wt shift due to pain in thigh, this limited her tolerated distance   Optometrist     Tilt Bed    Modified Rankin (Stroke Patients Only)       Balance Overall balance assessment: Needs assistance Sitting-balance support: No upper extremity supported, Feet supported Sitting balance-Leahy  Scale: Good Sitting balance - Comments: statically with supervision   Standing balance support: Bilateral upper extremity supported, No upper extremity supported, During functional activity, Single extremity supported Standing  balance-Leahy Scale: Poor Standing balance comment: heavy reliance on RW for support in standing                            Cognition Arousal: Alert Behavior During Therapy: Flat affect Overall Cognitive Status: History of cognitive impairments - at baseline                                 General Comments: pt following commands, knew when she needed to use bathroom. Needs cues for sequencing        Exercises General Exercises - Lower Extremity Ankle Circles/Pumps: AROM, PROM, Both, 10 reps, Seated Quad Sets: AROM, Both, 10 reps, Seated Long Arc Quad: AROM, Both, 10 reps, Seated Hip ABduction/ADduction: AAROM, Left, 10 reps, Seated Straight Leg Raises: AAROM, Left, 10 reps, Seated    General Comments General comments (skin integrity, edema, etc.): BP supine 134/61, sitting 129/60      Pertinent Vitals/Pain Pain Assessment Pain Assessment: Faces Faces Pain Scale: Hurts little more Pain Location: L LE (thigh and foot) and L shoulder to touch Pain Descriptors / Indicators: Discomfort, Sharp, Shooting Pain Intervention(s): Limited activity within patient's tolerance, Monitored during session    Home Living                          Prior Function            PT Goals (current goals can now be found in the care plan section) Acute Rehab PT Goals Patient Stated Goal: to go to rehab, less pain PT Goal Formulation: With patient Time For Goal Achievement: 10/30/23 Potential to Achieve Goals: Fair Progress towards PT goals: Progressing toward goals    Frequency    Min 1X/week      PT Plan      Co-evaluation              AM-PAC PT "6 Clicks" Mobility   Outcome Measure  Help needed turning from your back to your side while in a flat bed without using bedrails?: A Little Help needed moving from lying on your back to sitting on the side of a flat bed without using bedrails?: A Little Help needed moving to and from a bed to a  chair (including a wheelchair)?: A Little Help needed standing up from a chair using your arms (e.g., wheelchair or bedside chair)?: A Little Help needed to walk in hospital room?: A Lot Help needed climbing 3-5 steps with a railing? : Total 6 Click Score: 15    End of Session Equipment Utilized During Treatment: Gait belt;Other (comment) (L foot brace (AFO), abdominal binder, RLE TED hose) Activity Tolerance: Patient limited by pain;Treatment limited secondary to medical complications (Comment) (dizziness) Patient left: with call bell/phone within reach;in chair;with chair alarm set Nurse Communication: Mobility status PT Visit Diagnosis: Muscle weakness (generalized) (M62.81);Pain;Other abnormalities of gait and mobility (R26.89);Difficulty in walking, not elsewhere classified (R26.2) Pain - Right/Left: Left Pain - part of body: Leg (thigh wound)     Time: 7829-5621 PT Time Calculation (min) (ACUTE ONLY): 36 min  Charges:    $Gait Training: 8-22 mins $Therapeutic Activity: 8-22 mins PT General Charges $$ ACUTE PT VISIT:  1 Visit                     Lyanne Co, PT  Acute Rehab Services Secure chat preferred Office [phone]    Elyse Hsu 10/24/2023, 1:25 PM

## 2023-10-24 NOTE — Progress Notes (Signed)
Daily Progress Note Intern Pager: [phone]  Patient name: Elizabeth Schwartz Medical record number: [id number] Date of birth: [birth year] Age: 77 y.o. Gender: female  Primary Care Provider: Storm Frisk, MD Consultants: Plastic surgery, ID Code Status: DNR/DNI   Pt Overview and Major Events to Date:  11/3: Admitted to FMTS 11/11: I&D L thigh 11/17: Meropenem started 11/20: Left thigh wound evaluation under anesthesia w/ debridement 11/24: Daptomycin added due to MRSA in deep wound culture 11/26: PICC placed 12/03: Return to OR with Dr. Ladona Ridgel (plastic surgery) for debridement 12/06: Cultures growing Pseudomonas, switched meropenem to Zerbax   Assessment and Plan: Elizabeth Schwartz is a 77 y.o. female with a pertinent PMH of schizophrenia, dementia, COPD, and osteoporosis who presented with left thigh wound in setting of crush injury and was admitted for necrotizing myositis and osteomyelitis, currently s/p multiple debridements with plastics and with repeat wound cultures growing resistant Pseudomonas.  Unfortunately, patient must remain hospitalized while on Zerbaxa due to cost of medication being untenable for SNF placement.  Believe that the patient's AKI is due to low p.o. intake.  Encouraging p.o. with nursing care order and instructed patient to drink more as well.  Do not expect that patient's antibiotic regiment is contributing significantly after consulting pharmacy.  Will recheck BMP tomorrow and adjust approach as needed. Assessment & Plan Necrotizing myositis Left thigh wound from a crush injury now s/p I&D on 11/11, 11/20, 12/3.  Now growing Pseudomonas in wound cultures with concern for carbapenem resistance, thus switched to Zerbaxa per ID pending sensitivities.  No further benefit of debridement at this time per plastic surgery, will follow up outpatient after discharge.  Antibiotic history: Zosyn 11/3-11/14 Vancomycin 11/3 Linezolid 11/4-11/15 Meropenem  11/14-12/6 Gentamicin TP 11/30-12/4 Daptomycin 11/24-? Zerbaxa 12/6-?  -Continue Zerbaxa until 12/23 per ID for now -Pain management: Scheduled tylenol 650mg  Q6h, increase oxycodone to 5mg  q6h prn -Dressing change every shift -Weekly labs (BMP, CBC, CK) on Fridays -Plastic surgery following, no further plan for debridements or wound VAC at this time, appreciate recommendations -ID following, appreciate recommendations AKI (acute kidney injury) (HCC) Creatinine bump this morning, suspect in setting of poor p.o. intake. -Nursing care orders encouraged 250 mL PO Q4h -Bladder scan x1 -AM BMP, trend creatinine Goals of care, counseling/discussion Disposition was pending APS case worker placement, but was evaluated by Palliative Care and determined to have capacity.  Stated DNR status and consented to transfer to SNF.  Will likely need to spend down given her assets to remain in LTC. -TOC to follow-up placement Osteomyelitis of left femur (HCC) Early acute osteomyelitis found on CT.  IV abx treatment plan as above. Anemia Hemoglobin stable at 8.3.  Likely chronic disease, some blood loss following repeat debridements. -Weekly CBC  Chronic and Stable Problems: None  FEN/GI: Regular PPx: Enoxaparin Dispo: SNF pending completion of antibiotics, wound culture sensitivities.  Subjective:  This morning, patient states she is doing well.  Does still complain of some pain in her left thigh, especially overnight.  Otherwise comfortable and resting.  Objective: Temp:  [98.1 F (36.7 C)-98.4 F (36.9 C)] 98.4 F (36.9 C) (12/10 0823) Pulse Rate:  [76-82] 79 (12/10 0823) Resp:  [16-17] 16 (12/10 0823) BP: (127-136)/(46-66) 130/56 (12/10 0823) SpO2:  [94 %-97 %] 94 % (12/10 1478)  Physical Exam: General: Elderly female, frail, deconditioned, resting comfortably in bed uncovers, NAD, alert and oriented x 4. Cardiovascular: Regular rate and rhythm. Normal S1/S2. No murmurs, rubs, or gallops  appreciated. 2+ radial pulses. Pulmonary: Clear bilaterally to ascultation. No increased WOB, no accessory muscle usage on room air. No wheezes, rales, or crackles. Skin: Left thigh wound with clean dressing, dry and not draining.  No surrounding edema or erythema.  Appropriately tender to palpation. Extremities: No peripheral edema bilaterally.  2+ DP and PT pulses bilaterally.  Capillary refill ~2 seconds.  Laboratory: Most recent CBC Lab Results  Component Value Date   WBC 7.0 10/20/2023   HGB 8.3 (L) 10/20/2023   HCT 26.8 (L) 10/20/2023   MCV 90.2 10/20/2023   PLT 312 10/20/2023   Most recent BMP    Latest Ref Rng & Units 10/24/2023    4:03 AM  BMP  Glucose 70 - 99 mg/dL 563   BUN 8 - 23 mg/dL 30   Creatinine 8.75 - 1.00 mg/dL 6.43   Sodium 329 - 518 mmol/L 141   Potassium 3.5 - 5.1 mmol/L 3.8   Chloride 98 - 111 mmol/L 110   CO2 22 - 32 mmol/L 21   Calcium 8.9 - 10.3 mg/dL 9.3     Other pertinent labs: -None  New Imaging/Diagnostic Tests: -None  Elizabeth Wegman, MD 10/24/2023, 8:41 AM  PGY-1, [facility] FPTS Intern pager: [phone], text pages welcome Secure chat group [facility] Teaching Service

## 2023-10-24 NOTE — Assessment & Plan Note (Addendum)
Hemoglobin stable at 8.3.  Likely chronic disease, some blood loss following repeat debridements. -Weekly CBC

## 2023-10-24 NOTE — Assessment & Plan Note (Signed)
 Disposition was pending APS case worker placement, but was evaluated by Palliative Care and determined to have capacity.  Stated DNR status and consented to transfer to SNF.  Will likely need to spend down given her assets to remain in LTC. -TOC to follow-up placement

## 2023-10-24 NOTE — TOC Progression Note (Addendum)
Transition of Care [facility]) - Progression Note    Patient Details  Name: Elizabeth Schwartz MRN: [id number] Date of Birth: May 27, 1946  [facility] of [facility]) CM/SW Contact  Lorri Frederick, LCSW Phone Number: 10/24/2023, 10:05 AM  Clinical Narrative:   Update from [facility] APS.  Since pt is cooperating with plan for rehab, they are planning to close the case.  Shefi was not able to reach the niece/POA either.  Per [facility], pt is able to contact the bank herself to get any needed records for a medicaid application if that is required--but [facility] does not consider that an immediate need since the immediate need is rehab.    TC Tammy/[facility].  CSW updated her on the above, she will come meet with pt this afternoon to see how difficult it will be to get bank records with pt requesting them.    Expected Discharge Plan: Skilled Nursing Facility Barriers to Discharge: SNF Pending bed offer  Expected Discharge Plan and Services In-house Referral: Clinical Social Work   Post Acute Care Choice: Skilled Nursing Facility Living arrangements for the past 2 months: Single Family Home                                       Social Determinants of Health (SDOH) Interventions SDOH Screenings   Food Insecurity: No Food Insecurity (09/18/2023)  Housing: Patient Unable To Answer (09/18/2023)  Transportation Needs: No Transportation Needs (09/18/2023)  Utilities: Not At Risk (09/18/2023)  Alcohol Screen: Low Risk  (03/22/2023)  Depression (PHQ2-9): Low Risk  (07/13/2023)  Financial Resource Strain: Low Risk  (03/22/2023)  Physical Activity: Insufficiently Active (03/22/2023)  Social Connections: Socially Isolated (03/22/2023)  Stress: No Stress Concern Present (03/22/2023)  Tobacco Use: Medium Risk (10/17/2023)    Readmission Risk Interventions     No data to display

## 2023-10-24 NOTE — Plan of Care (Signed)
  Problem: Coping: Goal: Ability to adjust to condition or change in health will improve Outcome: Progressing   Problem: Health Behavior/Discharge Planning: Goal: Ability to identify and utilize available resources and services will improve Outcome: Progressing   

## 2023-10-24 NOTE — Assessment & Plan Note (Signed)
 Early acute osteomyelitis found on CT. IV abx treatment plan as above.

## 2023-10-25 DIAGNOSIS — L89224 Pressure ulcer of left hip, stage 4: Secondary | ICD-10-CM | POA: Diagnosis not present

## 2023-10-25 LAB — BASIC METABOLIC PANEL
Anion gap: 8 (ref 5–15)
BUN: 38 mg/dL — ABNORMAL HIGH (ref 8–23)
CO2: 19 mmol/L — ABNORMAL LOW (ref 22–32)
Calcium: 9.6 mg/dL (ref 8.9–10.3)
Chloride: 110 mmol/L (ref 98–111)
Creatinine, Ser: 0.91 mg/dL (ref 0.44–1.00)
GFR, Estimated: >60 mL/min (ref >60)
Glucose, Bld: 145 mg/dL — ABNORMAL HIGH (ref 70–99)
Potassium: 4.2 mmol/L (ref 3.5–5.1)
Sodium: 137 mmol/L (ref 135–145)

## 2023-10-25 NOTE — Assessment & Plan Note (Signed)
Creatinine now improving, GFR >60.  BUN/creatinine ratio ~42.  Bladder scan normal yesterday. -Nursing care orders encouraged 250 mL PO Q4h -AM BMP, trend creatinine

## 2023-10-25 NOTE — Assessment & Plan Note (Signed)
Left thigh wound from a crush injury now s/p I&D on 11/11, 11/20, 12/3.  Now growing Pseudomonas in wound cultures with concern for carbapenem resistance, thus switched to Zerbaxa per ID pending sensitivities.  No further benefit of debridement at this time per plastic surgery, will follow up outpatient after discharge.  Antibiotic history: Zosyn 11/3-11/14 Vancomycin 11/3 Linezolid 11/4-11/15 Meropenem 11/14-12/6 Gentamicin TP 11/30-12/4 Daptomycin 11/24-? Zerbaxa 12/6-?  -Continue Zerbaxa until 12/23 per ID for now -Pain management: Scheduled tylenol 650mg  Q6h, increase oxycodone to 5mg  q6h prn -Dressing change every shift -Weekly labs (BMP, CBC, CK) on Fridays -Plastic surgery following, no further plan for debridements or wound VAC at this time, appreciate recommendations -ID following, appreciate recommendations

## 2023-10-25 NOTE — Assessment & Plan Note (Signed)
 Early acute osteomyelitis found on CT. IV abx treatment plan as above.

## 2023-10-25 NOTE — Progress Notes (Signed)
Daily Progress Note Intern Pager: [phone]  Patient name: Elizabeth Schwartz Medical record number: [id number] Date of birth: 1946-09-07 Age: 77 y.o. Gender: female  Primary Care Provider: Storm Frisk, MD Consultants: Plastic surgery, ID Code Status: DNR/DNI   Pt Overview and Major Events to Date:  11/3: Admitted to FMTS 11/11: I&D L thigh 11/17: Meropenem started 11/20: Left thigh wound evaluation under anesthesia w/ debridement 11/24: Daptomycin added due to MRSA in deep wound culture 11/26: PICC placed 12/03: Return to OR with Dr. Ladona Ridgel (plastic surgery) for debridement 12/06: Cultures growing Pseudomonas, switched meropenem to Zerbaxa   Assessment and Plan: Elizabeth Schwartz is a 77 y.o. female with a pertinent PMH of schizophrenia, dementia, COPD, and osteoporosis who presented with left thigh wound in setting of crush injury and was admitted for necrotizing myositis and osteomyelitis, currently s/p multiple debridements with plastics and with repeat wound cultures growing resistant Pseudomonas.  Unfortunately, patient must remain hospitalized while on Zerbaxa due to cost of medication being untenable for SNF placement.  AKI is improving of improved PO hydration, continue to suspect prerenal etiology which is further supported by BUN/creatinine ratio >20.  Bladder scan normal yesterday, no evidence of postobstructive uropathy.  Interfacing with RN to ensure frequent PO hydration, encouraging patient as well.  Will continue to trend creatinine, conduct urine studies if not improving further. Assessment & Plan Necrotizing myositis Left thigh wound from a crush injury now s/p I&D on 11/11, 11/20, 12/3.  Now growing Pseudomonas in wound cultures with concern for carbapenem resistance, thus switched to Zerbaxa per ID pending sensitivities.  No further benefit of debridement at this time per plastic surgery, will follow up outpatient after discharge.  Antibiotic  history: Zosyn 11/3-11/14 Vancomycin 11/3 Linezolid 11/4-11/15 Meropenem 11/14-12/6 Gentamicin TP 11/30-12/4 Daptomycin 11/24-? Zerbaxa 12/6-?  -Continue Zerbaxa until 12/23 per ID for now -Pain management: Scheduled tylenol 650mg  Q6h, increase oxycodone to 5mg  q6h prn -Dressing change every shift -Weekly labs (BMP, CBC, CK) on Fridays -Plastic surgery following, no further plan for debridements or wound VAC at this time, appreciate recommendations -ID following, appreciate recommendations AKI (acute kidney injury) (HCC) Creatinine now improving, GFR >60.  BUN/creatinine ratio ~42.  Bladder scan normal yesterday. -Nursing care orders encouraged 250 mL PO Q4h -AM BMP, trend creatinine Goals of care, counseling/discussion Disposition was pending APS case worker placement, but was evaluated by Palliative Care and determined to have capacity.  Stated DNR status and consented to transfer to SNF.  Will likely need to spend down given her assets to remain in LTC. -TOC to follow-up placement Osteomyelitis of left femur (HCC) Early acute osteomyelitis found on CT.  IV abx treatment plan as above. Anemia Hemoglobin stable at 8.3.  Likely chronic disease, some blood loss following repeat debridements. -Weekly CBC  Chronic and Stable Problems: None   FEN/GI: Regular PPx: Enoxaparin Dispo: SNF pending completion of Zerbaxa.  Subjective:  This morning, patient reports that she is feeling well.  Does continue to report some pain in the left thigh overnight.  Otherwise comfortable and happy to stay in the hospital as long as antibiotics continue.  Is alert to self, place, situation, but not time.  Objective: Temp:  [98 F (36.7 C)-98.5 F (36.9 C)] 98 F (36.7 C) (12/10 2013) Pulse Rate:  [79-89] 84 (12/11 0517) Resp:  [16-18] 17 (12/11 0517) BP: (122-139)/(43-67) 139/43 (12/11 0517) SpO2:  [94 %-99 %] 97 % (12/11 0517)  Physical Exam: General: Elderly female, frail, deconditioned,  resting comfortably in chair at side of bed, NAD, alert and at baseline. Cardiovascular: Regular rate and rhythm. Normal S1/S2. No murmurs, rubs, or gallops appreciated. 2+ radial pulses. Pulmonary: Clear bilaterally to ascultation. No increased WOB, no accessory muscle usage on room air. No wheezes, rales, or crackles. Skin: Warm and dry. Extremities: No peripheral edema bilaterally.  Capillary refill <2 seconds.  L thigh wound with dry dressing, no drainage or edema.  Appropriately tender to light palpation.  Laboratory: Most recent CBC Lab Results  Component Value Date   WBC 7.0 10/20/2023   HGB 8.3 (L) 10/20/2023   HCT 26.8 (L) 10/20/2023   MCV 90.2 10/20/2023   PLT 312 10/20/2023   Most recent BMP    Latest Ref Rng & Units 10/25/2023    2:04 AM  BMP  Glucose 70 - 99 mg/dL 578   BUN 8 - 23 mg/dL 38   Creatinine 4.69 - 1.00 mg/dL 6.29   Sodium 528 - 413 mmol/L 137   Potassium 3.5 - 5.1 mmol/L 4.2   Chloride 98 - 111 mmol/L 110   CO2 22 - 32 mmol/L 19   Calcium 8.9 - 10.3 mg/dL 9.6     Other pertinent labs: -None  New Imaging/Diagnostic Tests: -None  Yeudiel Mateo, MD 10/25/2023, 7:59 AM  PGY-1, [facility] FPTS Intern pager: [phone], text pages welcome Secure chat group [facility] Teaching Service

## 2023-10-25 NOTE — Plan of Care (Signed)
  Problem: Nutritional: Goal: Maintenance of adequate nutrition will improve Outcome: Progressing   Problem: Skin Integrity: Goal: Risk for impaired skin integrity will decrease Outcome: Progressing   Problem: Clinical Measurements: Goal: Ability to maintain clinical measurements within normal limits will improve Outcome: Progressing Goal: Will remain free from infection Outcome: Progressing   Problem: Activity: Goal: Risk for activity intolerance will decrease Outcome: Progressing   Problem: Nutrition: Goal: Adequate nutrition will be maintained Outcome: Progressing   Problem: Elimination: Goal: Will not experience complications related to bowel motility Outcome: Progressing Goal: Will not experience complications related to urinary retention Outcome: Progressing   Problem: Pain Management: Goal: General experience of comfort will improve Outcome: Progressing   Problem: Safety: Goal: Ability to remain free from injury will improve Outcome: Progressing   Problem: Skin Integrity: Goal: Risk for impaired skin integrity will decrease Outcome: Progressing

## 2023-10-25 NOTE — Progress Notes (Signed)
Occupational Therapy Treatment Patient Details Name: Elizabeth Schwartz MRN: [id number] DOB: [birth year] Today's Date: 10/25/2023   History of present illness Pt is a 77 y/o F admitted on 09/17/23 after being advised by [facility] nurse to go to the ED after assessing pt's wound. CT showed necrotic myositis, pt also found to have acute osteomyelitis. Pt found to have symptomatic orthostatic hypotension during therapies 11/6-11/8, and consistently following week.  S/P I&D 11/11. 11/20, 12/3.  PMH: anemia, COPD, dementia, depression, DM, HLD, HTN, osteoporosis, schizo-affective, SOB, syncope.   OT comments  Patient seated on EOB upon entry attempting to take off gown due to being wet. Patient provided setup for bathing with supervision for UB bathing and mod assist for LB bathing with patient stating at RW to clean bottom. Patient with setup for donning gown and requiring mod assist for LB dressing due to assistance with compression and left sock. Patient performed transfer to recliner with RW and min assist with complaints of LLE pain. Patient will benefit from continued inpatient follow up therapy, <3 hours/day for continued OT services to increase independence and safety with self care and transfers.       If plan is discharge home, recommend the following:  A little help with walking and/or transfers;Assistance with cooking/housework;Direct supervision/assist for financial management;Direct supervision/assist for medications management;Assist for transportation;Help with stairs or ramp for entrance;A little help with bathing/dressing/bathroom   Equipment Recommendations  Other (comment) (defer)    Recommendations for Other Services      Precautions / Restrictions Precautions Precautions: Fall Precaution Comments: Contact; watch BP/HR Required Braces or Orthoses: Other Brace Other Brace: L foot drop brace, TED hose, abdominal binder Restrictions Weight Bearing Restrictions: No       Mobility  Bed Mobility Overal bed mobility: Needs Assistance             General bed mobility comments: seated on EOB upon entry    Transfers Overall transfer level: Needs assistance Equipment used: Rolling walker (2 wheels) Transfers: Sit to/from Stand, Bed to chair/wheelchair/BSC Sit to Stand: Min assist     Step pivot transfers: Min assist     General transfer comment: verbal cues for hand placement and cues for transfer technique and safety     Balance Overall balance assessment: Needs assistance Sitting-balance support: No upper extremity supported, Feet supported Sitting balance-Leahy Scale: Good Sitting balance - Comments: seated on EOB upon entry   Standing balance support: Bilateral upper extremity supported, No upper extremity supported, During functional activity, Single extremity supported Standing balance-Leahy Scale: Poor Standing balance comment: reliant on RW for support                           ADL either performed or assessed with clinical judgement   ADL Overall ADL's : Needs assistance/impaired     Grooming: Wash/dry hands;Wash/dry face;Supervision/safety;Sitting   Upper Body Bathing: Supervision/ safety;Sitting Upper Body Bathing Details (indicate cue type and reason): on EOB Lower Body Bathing: Moderate assistance;Sit to/from stand Lower Body Bathing Details (indicate cue type and reason): assistance with bottom when standing Upper Body Dressing : Set up;Sitting Upper Body Dressing Details (indicate cue type and reason): changed gown Lower Body Dressing: Moderate assistance Lower Body Dressing Details (indicate cue type and reason): mod assist with compresssion and left sock Toilet Transfer: Minimal assistance Toilet Transfer Details (indicate cue type and reason): simulated           General ADL Comments: self care performed  seated on EOB    Extremity/Trunk Assessment              Vision       Perception     Praxis       Cognition Arousal: Alert Behavior During Therapy: Flat affect Overall Cognitive Status: History of cognitive impairments - at baseline                                 General Comments: cues for sequencing and safety        Exercises      Shoulder Instructions       General Comments      Pertinent Vitals/ Pain       Pain Assessment Pain Assessment: Faces Faces Pain Scale: Hurts little more Pain Location: LLE thight and foot and LUE shoulder Pain Descriptors / Indicators: Discomfort, Sharp, Shooting Pain Intervention(s): Limited activity within patient's tolerance, Monitored during session, Repositioned  Home Living                                          Prior Functioning/Environment              Frequency  Min 1X/week        Progress Toward Goals  OT Goals(current goals can now be found in the care plan section)  Progress towards OT goals: Progressing toward goals  Acute Rehab OT Goals Patient Stated Goal: get better OT Goal Formulation: With patient Time For Goal Achievement: 11/01/23 Potential to Achieve Goals: Good ADL Goals Pt Will Perform Grooming: with supervision;standing Pt Will Perform Lower Body Dressing: with supervision;sit to/from stand;with adaptive equipment Pt Will Transfer to Toilet: with supervision;ambulating Pt Will Perform Toileting - Clothing Manipulation and hygiene: with supervision;sit to/from stand;sitting/lateral leans Additional ADL Goal #1: Pt will utilize compensatory memory strategies to optimize recall for ADL and iADL routine. Additional ADL Goal #2: Pt will verbalize 3 fall prevention techniques to optimize safety and independence during ADLs.  Plan      Co-evaluation                 AM-PAC OT "6 Clicks" Daily Activity     Outcome Measure   Help from another person eating meals?: A Little Help from another person taking care of personal grooming?: A Little Help from  another person toileting, which includes using toliet, bedpan, or urinal?: A Little Help from another person bathing (including washing, rinsing, drying)?: A Little Help from another person to put on and taking off regular upper body clothing?: A Little Help from another person to put on and taking off regular lower body clothing?: A Lot 6 Click Score: 17    End of Session Equipment Utilized During Treatment: Gait belt;Rolling walker (2 wheels)  OT Visit Diagnosis: Other abnormalities of gait and mobility (R26.89);Muscle weakness (generalized) (M62.81);Pain;Other symptoms and signs involving cognitive function Pain - Right/Left: Left Pain - part of body: Leg   Activity Tolerance Patient tolerated treatment well   Patient Left in chair;with call bell/phone within reach;with chair alarm set   Nurse Communication Mobility status        Time: 7846-9629 OT Time Calculation (min): 28 min  Charges: OT General Charges $OT Visit: 1 Visit OT Treatments $Self Care/Home Management : 23-37 mins  Alfonse Flavors, OTA Acute Rehabilitation Services  Office  [phone]   Dewain Penning 10/25/2023, 12:52 PM

## 2023-10-25 NOTE — Assessment & Plan Note (Signed)
 Disposition was pending APS case worker placement, but was evaluated by Palliative Care and determined to have capacity.  Stated DNR status and consented to transfer to SNF.  Will likely need to spend down given her assets to remain in LTC. -TOC to follow-up placement

## 2023-10-25 NOTE — Progress Notes (Signed)
Physical Therapy Treatment  Patient Details Name: Elizabeth Schwartz MRN: [id number] DOB: [birth year] Today's Date: 10/25/2023   History of Present Illness Pt is a 77 y/o F admitted on 09/17/23 after being advised by [facility] nurse to go to the ED after assessing pt's wound. CT showed necrotic myositis, pt also found to have acute osteomyelitis. Pt found to have symptomatic orthostatic hypotension during therapies 11/6-11/8, and consistently following week.  S/P I&D 11/11. 11/20, 12/3.  PMH: anemia, COPD, dementia, depression, DM, HLD, HTN, osteoporosis, schizo-affective, SOB, syncope.    PT Comments  Pt progressing towards physical therapy goals. Was able to perform transfers with up to mod assist and RW for support. Pt fatigued this afternoon, reports she does not remember if she sat up in the chair earlier today, however it appears that she did get to chair with OT per notes.  Unsure how long she was sitting up this morning, however pt motivated to sit up in the chair again this afternoon. Will continue to follow.    If plan is discharge home, recommend the following: A little help with bathing/dressing/bathroom;Assistance with cooking/housework;Supervision due to cognitive status;Direct supervision/assist for financial management;Assist for transportation;Help with stairs or ramp for entrance;Direct supervision/assist for medications management;A lot of help with walking and/or transfers   Can travel by private vehicle     No  Equipment Recommendations  Rolling walker (2 wheels);BSC/3in1;Wheelchair (measurements PT);Wheelchair cushion (measurements PT)    Recommendations for Other Services       Precautions / Restrictions Precautions Precautions: Fall Precaution Comments: Contact; watch BP/HR Required Braces or Orthoses: Other Brace Other Brace: L foot drop brace, TED hose, abdominal binder Restrictions Weight Bearing Restrictions: No     Mobility  Bed Mobility Overal bed mobility:  Needs Assistance Bed Mobility: Supine to Sit     Supine to sit: Mod assist     General bed mobility comments: Pt able to come into long sitting in the bed without assist. She began scooting herself towards EOB but required assist with bed pad to be able to get feet on the floor.    Transfers Overall transfer level: Needs assistance Equipment used: Rolling walker (2 wheels) Transfers: Sit to/from Stand, Bed to chair/wheelchair/BSC Sit to Stand: Min assist Stand pivot transfers: Min assist Step pivot transfers: Min assist       General transfer comment: VC's for hand placement on seated surface for safety. Pt with significant L knee valgus upon stand but able to correct once in full standing position. Min assist for boost up and to guide pt around to the chair.    Ambulation/Gait               General Gait Details: Pt reported she did not feel she could ambulate this session due to fatigue.   Stairs             Wheelchair Mobility     Tilt Bed    Modified Rankin (Stroke Patients Only)       Balance Overall balance assessment: Needs assistance Sitting-balance support: No upper extremity supported, Feet supported Sitting balance-Leahy Scale: Good Sitting balance - Comments: seated on EOB upon entry   Standing balance support: Bilateral upper extremity supported, No upper extremity supported, During functional activity, Single extremity supported Standing balance-Leahy Scale: Poor Standing balance comment: reliant on RW for support  Cognition Arousal: Alert Behavior During Therapy: Flat affect Overall Cognitive Status: History of cognitive impairments - at baseline                                          Exercises      General Comments        Pertinent Vitals/Pain Pain Assessment Pain Assessment: Faces Faces Pain Scale: Hurts a little bit Pain Location: LLE thigh and foot and LUE  shoulder Pain Descriptors / Indicators: Discomfort, Aching Pain Intervention(s): Limited activity within patient's tolerance, Monitored during session, Repositioned    Home Living                          Prior Function            PT Goals (current goals can now be found in the care plan section) Acute Rehab PT Goals Patient Stated Goal: to go to rehab, less pain PT Goal Formulation: With patient Time For Goal Achievement: 10/30/23 Potential to Achieve Goals: Fair Progress towards PT goals: Progressing toward goals    Frequency    Min 1X/week      PT Plan      Co-evaluation              AM-PAC PT "6 Clicks" Mobility   Outcome Measure  Help needed turning from your back to your side while in a flat bed without using bedrails?: A Little Help needed moving from lying on your back to sitting on the side of a flat bed without using bedrails?: A Little Help needed moving to and from a bed to a chair (including a wheelchair)?: A Little Help needed standing up from a chair using your arms (e.g., wheelchair or bedside chair)?: A Little Help needed to walk in hospital room?: Total Help needed climbing 3-5 steps with a railing? : Total 6 Click Score: 14    End of Session Equipment Utilized During Treatment: Gait belt;Other (comment) (L foot brace (AFO), abdominal binder, RLE TED hose) Activity Tolerance: Patient limited by fatigue Patient left: with call bell/phone within reach;in chair;with chair alarm set Nurse Communication: Mobility status PT Visit Diagnosis: Muscle weakness (generalized) (M62.81);Pain;Other abnormalities of gait and mobility (R26.89);Difficulty in walking, not elsewhere classified (R26.2) Pain - Right/Left: Left Pain - part of body: Leg (thigh wound)     Time: 3244-0102 PT Time Calculation (min) (ACUTE ONLY): 27 min  Charges:    $Gait Training: 8-22 mins $Therapeutic Activity: 8-22 mins PT General Charges $$ ACUTE PT VISIT: 1  Visit                     Conni Slipper, PT, DPT Acute Rehabilitation Services Secure Chat Preferred Office: [phone]    Elizabeth Schwartz 10/25/2023, 3:03 PM

## 2023-10-25 NOTE — Assessment & Plan Note (Signed)
Hemoglobin stable at 8.3.  Likely chronic disease, some blood loss following repeat debridements. -Weekly CBC

## 2023-10-26 DIAGNOSIS — L89224 Pressure ulcer of left hip, stage 4: Secondary | ICD-10-CM | POA: Diagnosis not present

## 2023-10-26 LAB — FUNGUS CULTURE WITH STAIN

## 2023-10-26 LAB — FUNGUS CULTURE RESULT

## 2023-10-26 LAB — FUNGAL ORGANISM REFLEX

## 2023-10-26 LAB — MIC RESULTS (2 DRUGS)

## 2023-10-26 MED ORDER — ALTEPLASE 2 MG IJ SOLR
2.0000 mg | Freq: Once | INTRAMUSCULAR | Status: AC
  Administered 2023-10-26: 2 mg
  Filled 2023-10-26: qty 2

## 2023-10-26 NOTE — Progress Notes (Signed)
Antimicrobial Stewardship Pharmacy Note  Noted some MIC result back listed below:   Pseudomonas aeruginosa  AVYCAZ       >=16/4 UG/ML = RESISTANT  ZERBAXA          8/4 UG/ML = INTERMEDIATE  Performed At: [facility]  [address] [place], [place] [id number]  Some additional MIC requests still pending including Vabomere, plan to also send out for Imipenem-cilastatin-relebactam (Recarbrio) + Cefiderocol (Fetroja).   Pseudomonas known to be mixed colonies with varying sensitivities - wound site is improving so the plan per the ID team is to hold the course with Zerbaxa monotherapy for now. Could consider additional of aminoglycoside (Tobramycin) if clinically worsening.   Thank you for allowing pharmacy to be a part of this patient's care.  Georgina Pillion, PharmD, BCPS, BCIDP Infectious Diseases Clinical Pharmacist 10/26/2023 11:34 AM   **Pharmacist phone directory can now be found on [email or web address] ([email or web address]).  Listed under [facility].

## 2023-10-26 NOTE — Assessment & Plan Note (Addendum)
Disposition was pending APS case worker placement, but was evaluated by Palliative Care and determined to have capacity.  Stated DNR status and consented to transfer to SNF.  Will likely need to spend down given her assets to remain in LTC. -Prior auth pending

## 2023-10-26 NOTE — Progress Notes (Signed)
   10/26/23 1025  Mobility  Activity Transferred from bed to chair  Level of Assistance Minimal assist, patient does 75% or more (+2)  Assistive Device Front wheel walker  Activity Response Tolerated fair  Mobility Referral Yes  Mobility visit 1 Mobility  Mobility Specialist Start Time (ACUTE ONLY) 1005  Mobility Specialist Stop Time (ACUTE ONLY) 1025  Mobility Specialist Time Calculation (min) (ACUTE ONLY) 20 min   Mobility Specialist: Progress Note  Pt agreeable to mobility session - received in bed. Required MinA+2 using RW. C/o slight dizziness after STS, LLE pain shown through facial grimaces. Returned to chair with all needs met - call bell within reach. L ankle brace and abd binder on.   Barnie Mort, BS Mobility Specialist Please contact via SecureChat or Rehab office at [phone].

## 2023-10-26 NOTE — Assessment & Plan Note (Signed)
Hemoglobin stable at 8.3.  Likely chronic disease, some blood loss following repeat debridements. -Weekly CBC

## 2023-10-26 NOTE — Plan of Care (Signed)

## 2023-10-26 NOTE — Progress Notes (Signed)
Daily Progress Note Intern Pager: [phone]  Patient name: Elizabeth Schwartz Medical record number: [id number] Date of birth: Aug 12, 1946 Age: 77 y.o. Gender: female  Primary Care Provider: Storm Frisk, MD Consultants: Plastic surgery, Infectious Disease Code Status: DNR/DNI  Pt Overview and Major Events to Date:  11/3: Admitted to [facility] 11/11: I&D L thigh 11/17: Meropenem started 11/20: Left thigh wound evaluation under anesthesia w/ debridement 11/24: Daptomycin added due to MRSA in deep wound culture 11/26: PICC placed 12/03: Return to OR with Dr. Ladona Schwartz (plastic surgery) for debridement 12/06: Cultures growing Pseudomonas, switched meropenem to Zerbaxa   Assessment and Plan: Elizabeth Schwartz is a 77 y.o. female with a pertinent PMH of schizophrenia, dementia, COPD, and osteoporosis who presented with left thigh wound in setting of crush injury and was admitted for necrotizing myositis and osteomyelitis, currently s/p multiple debridements with plastics and with repeat wound cultures growing MDR Pseudomonas.  Today, sensitivities have resulted and no change in therapy is planned, antibiotics to continue until 12/23.  Initially, heard that patient must remain hospitalized while on Zerbaxa due to cost of medication being untenable for SNF.  However, per TOC LCSW today, SNF was able to obtain both Zerbaxa and daptomycin and prior authorization for SNF has been placed and is pending.  Did not get creatinine yesterday due to downtrend, will check again tomorrow and continue to encourage PO today. Assessment & Plan Necrotizing myositis Left thigh wound from a crush injury now s/p I&D on 11/11, 11/20, 12/3.  Now growing Pseudomonas in wound cultures with concern for carbapenem resistance, thus switched to Zerbaxa per ID pending sensitivities.  No further benefit of debridement at this time per plastic surgery, will follow up outpatient after discharge.  Antibiotic history: Zosyn  11/3-11/14 Vancomycin 11/3 Linezolid 11/4-11/15 Meropenem 11/14-12/6 Gentamicin TP 11/30-12/4 Daptomycin 11/24-? Zerbaxa 12/6-?  -Continue Zerbaxa until 12/23 per ID for now -Pain management: Scheduled tylenol 650mg  Q6h, increase oxycodone to 5mg  Q6h prn -Dressing change every shift -Weekly labs (BMP, CBC, CK) on Fridays -Plastic surgery following, no further plan for debridements or wound VAC at this time, appreciate recommendations -ID following, appreciate recommendations AKI (acute kidney injury) (HCC) Recheck creatinine tomorrow, continue encouraging PO. -Nursing care orders encouraged 250 mL PO Q4h -AM BMP, trend creatinine Goals of care, counseling/discussion Disposition was pending APS case worker placement, but was evaluated by Palliative Care and determined to have capacity.  Stated DNR status and consented to transfer to SNF.  Will likely need to spend down given her assets to remain in LTC. -Prior auth pending Osteomyelitis of left femur (HCC) Early acute osteomyelitis found on CT.  IV abx treatment plan as above. Anemia Hemoglobin stable at 8.3.  Likely chronic disease, some blood loss following repeat debridements. -Weekly CBC  Chronic and Stable Problems: None   FEN/GI: Regular PPx: Enoxaparin Dispo: SNF pending completion of Zerbaxa.  Subjective:  This morning, patient states she is feeling well, no pain in thigh at the moment.  States that ambulation does have some pain.  Alert to self, place, situation.  Comfortable overall.  Objective: Temp:  [98 F (36.7 C)-98.2 F (36.8 C)] 98.1 F (36.7 C) (12/12 0808) Pulse Rate:  [79-90] 79 (12/12 0808) Resp:  [16-18] 18 (12/12 0443) BP: (116-153)/(52-63) 116/52 (12/12 0808) SpO2:  [95 %-97 %] 95 % (12/12 1308)  Physical Exam: General: Elderly female, deconditioned, resting comfortably in bed, NAD, alert and at baseline. Cardiovascular: Regular rate and rhythm. Normal S1/S2. No murmurs,  rubs, or gallops  appreciated. 2+ radial pulses. Pulmonary: Clear bilaterally to ascultation. No increased WOB, no accessory muscle usage on room air. No wheezes, rales, or crackles. Skin: L thigh wound without drainage, dressing dry, appropriate tenderness to palpation.  No surrounding edema or erythema. Extremities: No peripheral edema bilaterally.  Capillary refill <2 seconds.  Laboratory: Most recent CBC Lab Results  Component Value Date   WBC 7.0 10/20/2023   HGB 8.3 (L) 10/20/2023   HCT 26.8 (L) 10/20/2023   MCV 90.2 10/20/2023   PLT 312 10/20/2023   Most recent BMP    Latest Ref Rng & Units 10/25/2023    2:04 AM  BMP  Glucose 70 - 99 mg/dL 440   BUN 8 - 23 mg/dL 38   Creatinine 1.02 - 1.00 mg/dL 7.25   Sodium 366 - 440 mmol/L 137   Potassium 3.5 - 5.1 mmol/L 4.2   Chloride 98 - 111 mmol/L 110   CO2 22 - 32 mmol/L 19   Calcium 8.9 - 10.3 mg/dL 9.6     Other pertinent labs: -None  New Imaging/Diagnostic Tests: -None  Elizabeth Kehm, MD 10/26/2023, 8:19 AM  PGY-1, [facility] FPTS Intern pager: [phone], text pages welcome Secure chat group [facility] Teaching Service

## 2023-10-26 NOTE — Assessment & Plan Note (Addendum)
Left thigh wound from a crush injury now s/p I&D on 11/11, 11/20, 12/3.  Now growing Pseudomonas in wound cultures with concern for carbapenem resistance, thus switched to Zerbaxa per ID pending sensitivities.  No further benefit of debridement at this time per plastic surgery, will follow up outpatient after discharge.  Antibiotic history: Zosyn 11/3-11/14 Vancomycin 11/3 Linezolid 11/4-11/15 Meropenem 11/14-12/6 Gentamicin TP 11/30-12/4 Daptomycin 11/24-? Zerbaxa 12/6-?  -Continue Zerbaxa until 12/23 per ID for now -Pain management: Scheduled tylenol 650mg  Q6h, increase oxycodone to 5mg  Q6h prn -Dressing change every shift -Weekly labs (BMP, CBC, CK) on Fridays -Plastic surgery following, no further plan for debridements or wound VAC at this time, appreciate recommendations -ID following, appreciate recommendations

## 2023-10-26 NOTE — TOC Progression Note (Addendum)
Transition of Care [facility]) - Progression Note    Patient Details  Name: Elizabeth Schwartz MRN: [id number] Date of Birth: [birth year]  [facility] of [facility]) CM/SW Contact  Lorri Frederick, LCSW Phone Number: 10/26/2023, 11:12 AM  Clinical Narrative:   Message from Tammy/[employer].  She and pt have requested bank records, which will be sent to her apartment mailbox.  [place] Morelle is the manager at the apartment complex: [phone].  Tammy spoke with her and she requires verbal consent from [place]ree and then she can give the records to [place] when they arrive.  CSW spoke with Juliza and explained the above.  Barba in agreement that Tammy can get the records.  Lasharon called E[place]i in CSW presence and gave verbal consent for Tammy to get the mailed statements.   1300: message from Tammy Blakely/[employer].  Her pharmacy can get zerbaxa and they are requesting new auth with carve out for the abx.  MDs notified to confirm this plan.    Expected Discharge Plan: Skilled Nursing Facility Barriers to Discharge: SNF Pending bed offer  Expected Discharge Plan and Services In-house Referral: Clinical Social Work   Post Acute Care Choice: Skilled Nursing Facility Living arrangements for the past 2 months: Single Family Home                                       Social Determinants of Health (SDOH) Interventions SDOH Screenings   Food Insecurity: No Food Insecurity (09/18/2023)  Housing: Patient Unable To Answer (09/18/2023)  Transportation Needs: No Transportation Needs (09/18/2023)  Utilities: Not At Risk (09/18/2023)  Alcohol Screen: Low Risk  (03/22/2023)  Depression (PHQ2-9): Low Risk  (07/13/2023)  Financial Resource Strain: Low Risk  (03/22/2023)  Physical Activity: Insufficiently Active (03/22/2023)  Social Connections: Socially Isolated (03/22/2023)  Stress: No Stress Concern Present (03/22/2023)  Tobacco Use: Medium Risk (10/17/2023)    Readmission Risk Interventions     No data to  display

## 2023-10-26 NOTE — Assessment & Plan Note (Signed)
 Early acute osteomyelitis found on CT. IV abx treatment plan as above.

## 2023-10-26 NOTE — Assessment & Plan Note (Signed)
Recheck creatinine tomorrow, continue encouraging PO. -Nursing care orders encouraged 250 mL PO Q4h -AM BMP, trend creatinine

## 2023-10-26 NOTE — Plan of Care (Signed)
  Problem: Education: Goal: Knowledge of General Education information will improve Description: Including pain rating scale, medication(s)/side effects and non-pharmacologic comfort measures Outcome: Progressing   Problem: Health Behavior/Discharge Planning: Goal: Ability to manage health-related needs will improve Outcome: Progressing   Problem: Clinical Measurements: Goal: Ability to maintain clinical measurements within normal limits will improve Outcome: Progressing   Problem: Pain Management: Goal: General experience of comfort will improve Outcome: Progressing   Problem: Safety: Goal: Ability to remain free from injury will improve Outcome: Progressing

## 2023-10-27 DIAGNOSIS — L89224 Pressure ulcer of left hip, stage 4: Secondary | ICD-10-CM | POA: Diagnosis not present

## 2023-10-27 LAB — BASIC METABOLIC PANEL
Anion gap: 10 (ref 5–15)
BUN: 34 mg/dL — ABNORMAL HIGH (ref 8–23)
CO2: 20 mmol/L — ABNORMAL LOW (ref 22–32)
Calcium: 9.4 mg/dL (ref 8.9–10.3)
Chloride: 112 mmol/L — ABNORMAL HIGH (ref 98–111)
Creatinine, Ser: 0.92 mg/dL (ref 0.44–1.00)
GFR, Estimated: >60 mL/min (ref >60)
Glucose, Bld: 113 mg/dL — ABNORMAL HIGH (ref 70–99)
Potassium: 3.9 mmol/L (ref 3.5–5.1)
Sodium: 142 mmol/L (ref 135–145)

## 2023-10-27 LAB — CBC
HCT: 25 % — ABNORMAL LOW (ref 36.0–46.0)
Hemoglobin: 7.7 g/dL — ABNORMAL LOW (ref 12.0–15.0)
MCH: 27.6 pg (ref 26.0–34.0)
MCHC: 30.8 g/dL (ref 30.0–36.0)
MCV: 89.6 fL (ref 80.0–100.0)
Platelets: 291 10*3/uL (ref 150–400)
RBC: 2.79 MIL/uL — ABNORMAL LOW (ref 3.87–5.11)
RDW: 18.6 % — ABNORMAL HIGH (ref 11.5–15.5)
WBC: 6.1 10*3/uL (ref 4.0–10.5)
nRBC: 0 % (ref 0.0–0.2)

## 2023-10-27 MED ORDER — FENOFIBRATE 160 MG PO TABS
160.0000 mg | ORAL_TABLET | Freq: Every day | ORAL | Status: DC
  Administered 2023-10-27 – 2023-11-01 (×6): 160 mg via ORAL
  Filled 2023-10-27 (×6): qty 1

## 2023-10-27 MED ORDER — EZETIMIBE 10 MG PO TABS
10.0000 mg | ORAL_TABLET | Freq: Every day | ORAL | Status: DC
Start: 2023-10-27 — End: 2023-11-01
  Administered 2023-10-27 – 2023-11-01 (×6): 10 mg via ORAL
  Filled 2023-10-27 (×6): qty 1

## 2023-10-27 NOTE — Progress Notes (Signed)
Occupational Therapy Treatment Patient Details Name: Elizabeth Schwartz MRN: [id number] DOB: Nov 24, 1945 Today's Date: 10/27/2023   History of present illness Pt is a 77 y/o F admitted on 09/17/23 after being advised by [facility] nurse to go to the ED after assessing pt's wound. CT showed necrotic myositis, pt also found to have acute osteomyelitis. Pt found to have symptomatic orthostatic hypotension during therapies 11/6-11/8, and consistently following week.  S/P I&D 11/11. 11/20, 12/3.  PMH: anemia, COPD, dementia, depression, DM, HLD, HTN, osteoporosis, schizo-affective, SOB, syncope.   OT comments  Patient received in supine and agreeable to OOB for breakfast. Patient able to get to EOB with min assist for BLEs and had wet gown. Patient was mod assist for bathing peri area while standing and to donn underwear and min assist for gown change. Patient performed transfer to recliner with min assist and cues for hand placement and walker use. Patient will benefit from continued inpatient follow up therapy, <3 hours/day to address bathing, dressing, and functional transfers. Acute OT to continue to follow.       If plan is discharge home, recommend the following:  A little help with walking and/or transfers;Assistance with cooking/housework;Direct supervision/assist for financial management;Direct supervision/assist for medications management;Assist for transportation;Help with stairs or ramp for entrance;A little help with bathing/dressing/bathroom   Equipment Recommendations  Other (comment) (defer)    Recommendations for Other Services      Precautions / Restrictions Precautions Precautions: Fall Precaution Comments: Contact; watch BP/HR Required Braces or Orthoses: Other Brace Other Brace: L foot drop brace, TED hose, abdominal binder Restrictions Weight Bearing Restrictions Per Provider Order: No       Mobility Bed Mobility Overal bed mobility: Needs Assistance Bed Mobility: Supine to  Sit     Supine to sit: Min assist, HOB elevated, Used rails     General bed mobility comments: min assist with BLEs and scooting to EOB    Transfers Overall transfer level: Needs assistance Equipment used: Rolling walker (2 wheels) Transfers: Sit to/from Stand, Bed to chair/wheelchair/BSC Sit to Stand: Min assist     Step pivot transfers: Min assist     General transfer comment: verbal cues for hand placement and walker placment     Balance Overall balance assessment: Needs assistance Sitting-balance support: No upper extremity supported, Feet supported Sitting balance-Leahy Scale: Good Sitting balance - Comments: on EOB   Standing balance support: Bilateral upper extremity supported, No upper extremity supported, During functional activity, Single extremity supported Standing balance-Leahy Scale: Poor Standing balance comment: reliant on RW for support                           ADL either performed or assessed with clinical judgement   ADL Overall ADL's : Needs assistance/impaired Eating/Feeding: Set up;Sitting   Grooming: Wash/dry hands;Wash/dry face;Brushing hair;Set up;Sitting       Lower Body Bathing: Moderate assistance;Sit to/from stand Lower Body Bathing Details (indicate cue type and reason): assistance with bottom when standing Upper Body Dressing : Set up;Sitting Upper Body Dressing Details (indicate cue type and reason): changed gown Lower Body Dressing: Moderate assistance Lower Body Dressing Details (indicate cue type and reason): to donn underwear                    Extremity/Trunk Assessment              Vision       Perception     Praxis  Cognition Arousal: Alert Behavior During Therapy: Flat affect Overall Cognitive Status: History of cognitive impairments - at baseline                                          Exercises      Shoulder Instructions       General Comments BP up in  recliner 120/46 (63)    Pertinent Vitals/ Pain       Pain Assessment Pain Assessment: Faces Faces Pain Scale: Hurts a little bit Pain Location: LLE thigh and foot and LUE shoulder Pain Descriptors / Indicators: Discomfort, Aching Pain Intervention(s): Limited activity within patient's tolerance, Monitored during session, Repositioned  Home Living                                          Prior Functioning/Environment              Frequency  Min 1X/week        Progress Toward Goals  OT Goals(current goals can now be found in the care plan section)  Progress towards OT goals: Progressing toward goals  Acute Rehab OT Goals Patient Stated Goal: go home OT Goal Formulation: With patient Time For Goal Achievement: 11/01/23 Potential to Achieve Goals: Good ADL Goals Pt Will Perform Grooming: with supervision;standing Pt Will Perform Lower Body Dressing: with supervision;sit to/from stand;with adaptive equipment Pt Will Transfer to Toilet: with supervision;ambulating Pt Will Perform Toileting - Clothing Manipulation and hygiene: with supervision;sit to/from stand;sitting/lateral leans Additional ADL Goal #1: Pt will utilize compensatory memory strategies to optimize recall for ADL and iADL routine. Additional ADL Goal #2: Pt will verbalize 3 fall prevention techniques to optimize safety and independence during ADLs.  Plan      Co-evaluation                 AM-PAC OT "6 Clicks" Daily Activity     Outcome Measure   Help from another person eating meals?: A Little Help from another person taking care of personal grooming?: A Little Help from another person toileting, which includes using toliet, bedpan, or urinal?: A Little Help from another person bathing (including washing, rinsing, drying)?: A Little Help from another person to put on and taking off regular upper body clothing?: A Little Help from another person to put on and taking off regular  lower body clothing?: A Lot 6 Click Score: 17    End of Session Equipment Utilized During Treatment: Gait belt;Rolling walker (2 wheels)  OT Visit Diagnosis: Other abnormalities of gait and mobility (R26.89);Muscle weakness (generalized) (M62.81);Pain;Other symptoms and signs involving cognitive function Pain - Right/Left: Left Pain - part of body: Leg   Activity Tolerance Patient tolerated treatment well   Patient Left in chair;with call bell/phone within reach;with chair alarm set   Nurse Communication Mobility status        Time: 1610-9604 OT Time Calculation (min): 22 min  Charges: OT General Charges $OT Visit: 1 Visit OT Treatments $Self Care/Home Management : 8-22 mins  Alfonse Flavors, OTA Acute Rehabilitation Services  Office [phone]   Dewain Penning 10/27/2023, 9:09 AM

## 2023-10-27 NOTE — Assessment & Plan Note (Signed)
 Left thigh wound from a crush injury now s/p I&D on 11/11, 11/20, 12/3.  Now growing Pseudomonas in wound cultures with concern for carbapenem resistance, thus switched to Zerbaxa per ID pending sensitivities.  No further benefit of debridement at this time per plastic surgery, will follow up outpatient after discharge.  Antibiotic history: Zosyn 11/3-11/14 Vancomycin 11/3 Linezolid 11/4-11/15 Meropenem 11/14-12/6 Gentamicin TP 11/30-12/4 Daptomycin 11/24-? Zerbaxa 12/6-?  -Continue Zerbaxa until 12/23 per ID for now -Pain management: Scheduled tylenol 650mg  Q6h, increase oxycodone to 5mg  Q6h prn -Dressing change every shift -Weekly labs (BMP, CBC, CK) on Fridays -Plastic surgery following, no further plan for debridements or wound VAC at this time, appreciate recommendations -ID following, appreciate recommendations

## 2023-10-27 NOTE — TOC Progression Note (Signed)
Transition of Care [facility]) - Progression Note    Patient Details  Name: Elizabeth Schwartz MRN: [id number] Date of Birth: [birth year]  Transition of Care [facility]) CM/SW Contact  Lorri Frederick, LCSW Phone Number: 10/27/2023, 12:02 PM  Clinical Narrative:  [facility].  Auth still pending.  She needs hard copy of zerbaxa script so her pharmacy can order--will take 24 hours, so will not be available today.  MD notified re: script.     Expected Discharge Plan: Skilled Nursing Facility Barriers to Discharge: SNF Pending bed offer  Expected Discharge Plan and Services In-house Referral: Clinical Social Work   Post Acute Care Choice: Skilled Nursing Facility Living arrangements for the past 2 months: Single Family Home                                       Social Determinants of Health (SDOH) Interventions SDOH Screenings   Food Insecurity: No Food Insecurity (09/18/2023)  Housing: Low Risk  (10/26/2023)  Transportation Needs: No Transportation Needs (09/18/2023)  Utilities: Not At Risk (09/18/2023)  Alcohol Screen: Low Risk  (03/22/2023)  Depression (PHQ2-9): Low Risk  (07/13/2023)  Financial Resource Strain: Low Risk  (03/22/2023)  Physical Activity: Insufficiently Active (03/22/2023)  Social Connections: Socially Isolated (03/22/2023)  Stress: No Stress Concern Present (03/22/2023)  Tobacco Use: Medium Risk (10/17/2023)    Readmission Risk Interventions     No data to display

## 2023-10-27 NOTE — Discharge Instructions (Addendum)
Dear Elizabeth Schwartz,  Thank you for letting us participate in your care. You were hospitalized for for a thigh wound and diagnosed with Pressure injury of left thigh, stage 4 (HCC). You were treated with antibiotics.   POST-HOSPITAL & CARE INSTRUCTIONS You will be going to a skill nursing facility, where they will manage your care.  Go to your follow up appointments (listed below)   DOCTOR'S APPOINTMENT   Future Appointments  Date Time Provider Department Center  11/06/2023  1:30 PM Comer, Belia Heman, MD [facility]  11/16/2023 10:50 AM Hoy Register, MD [facility] None  12/22/2023  9:30 AM Marcos Eke, PA-C [facility] None  03/27/2024 10:45 AM [facility] ANNUAL WELLNESS VISIT [facility] None    Follow-up Information     Storm Frisk, MD Follow up.   Specialty: Pulmonary Disease Contact information: [address] [place] [address] [phone]                 Take care and be well!  Family Medicine Teaching Service Inpatient Team [place]  [facility]  [address] [place], [place] [address] [phone]

## 2023-10-27 NOTE — Care Management Important Message (Signed)
Important Message  Patient Details  Name: Elizabeth Schwartz MRN: [id number] Date of Birth: 02/10/1946   Important Message Given:  Yes - Medicare IM     Sherilyn Banker 10/27/2023, 3:49 PM

## 2023-10-27 NOTE — Plan of Care (Signed)
  Problem: Education: Goal: Knowledge of General Education information will improve Description: Including pain rating scale, medication(s)/side effects and non-pharmacologic comfort measures Outcome: Progressing   Problem: Health Behavior/Discharge Planning: Goal: Ability to manage health-related needs will improve Outcome: Progressing   Problem: Clinical Measurements: Goal: Ability to maintain clinical measurements within normal limits will improve Outcome: Progressing   Problem: Activity: Goal: Risk for activity intolerance will decrease Outcome: Progressing   Problem: Coping: Goal: Level of anxiety will decrease Outcome: Progressing   Problem: Pain Management: Goal: General experience of comfort will improve Outcome: Progressing   Problem: Skin Integrity: Goal: Risk for impaired skin integrity will decrease Outcome: Progressing

## 2023-10-27 NOTE — Assessment & Plan Note (Signed)
 Disposition was pending APS case worker placement, but was evaluated by Palliative Care and determined to have capacity.  Stated DNR status and consented to transfer to SNF.  Will likely need to spend down given her assets to remain in LTC. -Prior auth pending

## 2023-10-27 NOTE — Assessment & Plan Note (Signed)
 Early acute osteomyelitis found on CT. IV abx treatment plan as above.

## 2023-10-27 NOTE — Progress Notes (Signed)
10 Days Post-Op  Subjective: Patient is a 77 year old female with history of a necrotic medial thigh wound of the left leg.  She has undergone several irrigation and debridements with Dr. Ladona Ridgel with placement of myriad wound matrix.  Her most recent surgery was on 10/17/2023.  Patient's cultures have grown out multidrug-resistant Pseudomonas.  She is currently on Zerbaxa antibiotics.  Today, patient is sitting upright in her chair in no acute distress.  She states that she is doing well.  She states she sometimes has some pain to her left leg wound, but feels that medications are helping with the pain.  Denies any other issues or concerns at this time related to her wound.  Objective: Vital signs in last 24 hours: Temp:  [97.6 F (36.4 C)-98.8 F (37.1 C)] 98.8 F (37.1 C) (12/13 0725) Pulse Rate:  [80-85] 80 (12/13 0725) Resp:  [14-16] 16 (12/13 0725) BP: (118-127)/(47-75) 124/49 (12/13 0725) SpO2:  [94 %-97 %] 94 % (12/13 0725) Last BM Date : 10/25/23  Intake/Output from previous day: 12/12 0701 - 12/13 0700 In: -  Out: 1200 [Urine:1200] Intake/Output this shift: Total I/O In: 360 [P.O.:360] Out: -   General appearance: alert, cooperative, and no distress Resp: Unlabored breathing, no respiratory distress Extremities: Dressings to the left lower extremity were taken down at bedside.  There is some exudate noted throughout the wound.  There is no foul odor, or signs of infection.  There does appear to be more granulation tissue noted throughout the wound.  There is some epithelialization noted at the medial border of the wound.  Lab Results:     Latest Ref Rng & Units 10/27/2023    3:06 AM 10/20/2023    3:42 AM 10/18/2023    3:41 AM  CBC  WBC 4.0 - 10.5 K/uL 6.1  7.0  4.9   Hemoglobin 12.0 - 15.0 g/dL 7.7  8.3  8.6   Hematocrit 36.0 - 46.0 % 25.0  26.8  28.0   Platelets 150 - 400 K/uL 291  312  340     BMET Recent Labs    10/25/23 0204 10/27/23 0306  NA 137 142  K  4.2 3.9  CL 110 112*  CO2 19* 20*  GLUCOSE 145* 113*  BUN 38* 34*  CREATININE 0.91 0.92  CALCIUM 9.6 9.4   PT/INR No results for input(s): "LABPROT", "INR" in the last 72 hours. ABG No results for input(s): "PHART", "HCO3" in the last 72 hours.  Invalid input(s): "PCO2", "PO2"  Studies/Results: No results found.  Anti-infectives: Anti-infectives (From admission, onward)    Start     Dose/Rate Route Frequency Ordered Stop   10/20/23 1200  ceftolozane-tazobactam (ZERBAXA) 3 g in sodium chloride 0.9 % 100 mL IVPB        3 g 122.8 mL/hr over 60 Minutes Intravenous Every 8 hours 10/20/23 0934 11/06/23 2359   10/08/23 1400  DAPTOmycin (CUBICIN) IVPB 700 mg/158mL premix        700 mg 200 mL/hr over 30 Minutes Intravenous Daily 10/08/23 1301 11/06/23 2359   10/01/23 2200  meropenem (MERREM) 1 g in sodium chloride 0.9 % 100 mL IVPB  Status:  Discontinued        1 g 200 mL/hr over 30 Minutes Intravenous Every 8 hours 10/01/23 1459 10/20/23 0934   09/28/23 1300  meropenem (MERREM) 1 g in sodium chloride 0.9 % 100 mL IVPB  Status:  Discontinued        1 g 200 mL/hr over 30  Minutes Intravenous Every 12 hours 09/28/23 0956 10/01/23 1459   09/18/23 1600  vancomycin (VANCOCIN) IVPB 1000 mg/200 mL premix  Status:  Discontinued        1,000 mg 200 mL/hr over 60 Minutes Intravenous Every 24 hours 09/17/23 1904 09/18/23 1402   09/18/23 1500  linezolid (ZYVOX) tablet 600 mg  Status:  Discontinued        600 mg Oral Every 12 hours 09/18/23 1402 09/29/23 1456   09/17/23 2200  piperacillin-tazobactam (ZOSYN) IVPB 3.375 g  Status:  Discontinued        3.375 g 12.5 mL/hr over 240 Minutes Intravenous Every 8 hours 09/17/23 1904 09/28/23 0956   09/17/23 1545  piperacillin-tazobactam (ZOSYN) IVPB 3.375 g        3.375 g 100 mL/hr over 30 Minutes Intravenous  Once 09/17/23 1531 09/17/23 1701   09/17/23 1300  vancomycin (VANCOREADY) IVPB 1500 mg/300 mL        1,500 mg 150 mL/hr over 120 Minutes  Intravenous  Once 09/17/23 1253 09/17/23 1615       Assessment/Plan: s/p Procedure(s): left thigh wound evaluation, irrigation and debridement left thigh wound Plan: -Patient doing well, wound is slowly healing. -Continue with daily dressing changes -We will plan to see the patient next week if she is still admitted, otherwise patient will need to follow-up in our clinic after she is discharged -medical management and pain control per primary  Pictures were obtained of the patient and placed in the chart with the patient's or guardian's permission.   LOS: 40 days    Laurena Spies, PA-C 10/27/2023

## 2023-10-27 NOTE — Assessment & Plan Note (Addendum)
Hemoglobin stable at 8.3>7.7 over the course of past week.  Likely chronic disease, poor nutrition. -Weekly CBC

## 2023-10-27 NOTE — Progress Notes (Signed)
PHARMACY CONSULT NOTE FOR:  Please note this OPAT is informational only as patient will be discharging to SNF- no print.   OUTPATIENT  PARENTERAL ANTIBIOTIC THERAPY (OPAT)  Indication: Left thigh soft tissue infection Regimen: Daptomycin 700 mg every 24 hours; Zerbaxa 3g IV every 8 hours  End date: 11/06/23   IV antibiotic discharge orders are pended. To discharging provider:  please sign these orders via discharge navigator,  Select New Orders & click on the button choice - Manage This Unsigned Work.     Thank you for allowing pharmacy to be a part of this patient's care.  Jani Gravel, PharmD Clinical Pharmacist  10/27/2023 10:51 AM

## 2023-10-27 NOTE — Progress Notes (Signed)
Mobility Specialist: Progress Note   10/27/23 1219  Mobility  Level of Assistance Minimal assist, patient does 75% or more  Assistive Device Front wheel walker  Range of Motion/Exercises Active;Right leg;Left leg  Activity Response Tolerated well  Mobility Referral Yes  Mobility visit 1 Mobility  Mobility Specialist Start Time (ACUTE ONLY) 1112  Mobility Specialist Stop Time (ACUTE ONLY) 1130  Mobility Specialist Time Calculation (min) (ACUTE ONLY) 18 min    Pt was agreeable to mobility session - received in chair. Had c/o L knee pain that deferred ambulation. Performed 2x STS with light minA. Completed seated marches and knee kick exercises as well. After session pt c/o R hip pain and L shoulder pain in addition to L knee pain. Left in chair with all needs met, call bell in reach. Chair alarm on.   Maurene Capes Mobility Specialist Please contact via SecureChat or Rehab office at [phone]

## 2023-10-27 NOTE — Assessment & Plan Note (Addendum)
Creatinine stable today, continue encouraging PO. -Nursing care orders encouraged 250 mL PO Q4h -AM BMP, trend creatinine

## 2023-10-27 NOTE — Progress Notes (Signed)
Nutrition Follow-up  DOCUMENTATION CODES:   Not applicable  INTERVENTION:   -Continue regular diet, encourage meal intakes, fluids, protein snacks.  --1 packet Juven BID, each packet provides 95 calories, 2.5 grams of protein (collagen), and 9.8 grams of carbohydrate (3 grams sugar); also contains 7 grams of L-arginine and L-glutamine, 300 mg vitamin C, 15 mg vitamin E, 1.2 mcg vitamin B-12, 9.5 mg zinc, 200 mg calcium, and 1.5 g  Calcium Beta-hydroxy-Beta-methylbutyrate to support wound healing  -MVI/Minerals-1 Tab daily, vitamin C 250 mg BID.  -Suggest probiotic due to prolonged antibiotic therapy.  NUTRITION DIAGNOSIS:   Increased nutrient needs related to wound healing as evidenced by estimated needs.  -ongoing  GOAL:   Patient will meet greater than or equal to 90% of their needs  -progressing  MONITOR:   PO intake, Weight trends, Skin, Labs  REASON FOR ASSESSMENT:   Follow up  ASSESSMENT:   77 y/o female presented with left thigh wound after a crush injury last month. Imaging showed evidenced of necrotizing myositis and acute early osteomyelitis.  Recent hospitalization on 9/25-10/4/24  after a stove fell on her leg causing pressure injury of the left thigh.  She was found by neighbors and had rhabdo. Underwent SNF discharge after hospital stay and was recently discharged.  PMH: DM2, HTN, HLD, osteoporosis, COPD, anemia, schizophrenia, dementia.Admmited 11/3  11/11- underwent debridement of wound, cultures growing Pseudomonas, E faecalis and Bacteroides 11/20- Taken to the OR for debridement Pseudomonas aeruginosa, MRSA.  There was necrotic tissue next to femur and medial aspect of wound 12/3-debridement    Intakes recorded average 56% x 8 meals, patient continues to maintain that she has a good appetite. States she is taking the Juven and trying to eat protein at each meal. She did not care for the Ensure which has been discontinued. Weight  down 5.9 kg from  admit, question accuracy of initial weight, monitor.  Medications reviewed and include vitamin C 250 mg 2x daily, ferrous sulfate 325 mg every other day, MVI/Minerals-1 Tab daily.  Labs: Hgb 7.7, HCT 25.0      Diet Order:   Diet Order             Diet regular Room service appropriate? Yes; Fluid consistency: Thin  Diet effective now                   EDUCATION NEEDS:   Education needs have been addressed  Skin:  Skin Assessment: Reviewed RN Assessment Skin Integrity Issues:: Incisions Incisions: L thigh, L leg  Last BM:  10/25/23, type 6-large  Height:   Ht Readings from Last 1 Encounters:  10/17/23 5\' 5"  (1.651 m)    Weight:   Wt Readings from Last 1 Encounters:  10/17/23 68.9 kg    Ideal Body Weight:  59.1 kg  BMI:  Body mass index is 25.29 kg/m.  Estimated Nutritional Needs:   Kcal:  1650-1900 kcals/day  Protein:  77-89 gm/day  Fluid:  1650-1900 mL/day    Alvino Chapel, RDLD Clinical Dietitian If unable to reach, please contact "RD Inpatient" secure chat group between 8 am-4 pm daily"

## 2023-10-27 NOTE — Progress Notes (Signed)
Physical Therapy Treatment Patient Details Name: Elizabeth Schwartz MRN: [id number] DOB: [birth year] Today's Date: 10/27/2023   History of Present Illness Pt is a 77 y/o F admitted on 09/17/23 after being advised by [facility] nurse to go to the ED after assessing pt's wound. CT showed necrotic myositis, pt also found to have acute osteomyelitis. Pt found to have symptomatic orthostatic hypotension during therapies 11/6-11/8, and consistently following week.  S/P I&D 11/11. 11/20, 12/3.  PMH: anemia, COPD, dementia, depression, DM, HLD, HTN, osteoporosis, schizo-affective, SOB, syncope.    PT Comments  Patient progressing slowly.  Fatigued this pm and not wanting to try ambulation though participating in some LE therex to counteract L LE hip abduction and flexion.  She was able to stand and step with RW and min A to bed with some continued flexion and external rotation of L LE and limited tolerance to weight bearing.  Feel she remains appropriate for post-acute inpatient rehab (<3 hours/day).    If plan is discharge home, recommend the following: A little help with bathing/dressing/bathroom;Assistance with cooking/housework;Supervision due to cognitive status;Direct supervision/assist for financial management;Assist for transportation;Help with stairs or ramp for entrance;Direct supervision/assist for medications management;A lot of help with walking and/or transfers   Can travel by private vehicle        Equipment Recommendations  Rolling walker (2 wheels);BSC/3in1;Wheelchair (measurements PT);Wheelchair cushion (measurements PT)    Recommendations for Other Services       Precautions / Restrictions Precautions Precautions: Fall Required Braces or Orthoses: Other Brace Other Brace: L foot drop brace, TED hose, abdominal binder     Mobility  Bed Mobility Overal bed mobility: Needs Assistance Bed Mobility: Sit to Supine       Sit to supine: Contact guard assist, HOB elevated   General bed  mobility comments: able to lift L leg onto bed though leaning across bed to do it, cues for repositioning trunk and pt performed using rails.    Transfers Overall transfer level: Needs assistance Equipment used: Rolling walker (2 wheels) Transfers: Sit to/from Stand, Bed to chair/wheelchair/BSC Sit to Stand: Min assist Stand pivot transfers: Min assist Step pivot transfers: Min assist       General transfer comment: assist for balance and cues for managing L LE during stand to sit    Ambulation/Gait               General Gait Details: declined due to fatigue after up in chair several hours.   Stairs             Wheelchair Mobility     Tilt Bed    Modified Rankin (Stroke Patients Only)       Balance Overall balance assessment: Needs assistance   Sitting balance-Leahy Scale: Good Sitting balance - Comments: on EOB   Standing balance support: Bilateral upper extremity supported, During functional activity Standing balance-Leahy Scale: Poor Standing balance comment: reliant on RW for support                            Cognition Arousal: Alert Behavior During Therapy: WFL for tasks assessed/performed Overall Cognitive Status: History of cognitive impairments - at baseline                                 General Comments: increased time for processing, letting her describe how she needs help  Exercises Other Exercises Other Exercises: hip adductor squeezes x 5 w/ 5 sec hold.    General Comments General comments (skin integrity, edema, etc.): reports light headed initial transition wearing binder and THT on R LE, removed after in supine and lotion applied to skin      Pertinent Vitals/Pain Pain Assessment Pain Score: 8  Pain Location: LLE thigh and foot and LUE shoulder Pain Descriptors / Indicators: Discomfort, Aching Pain Intervention(s): Monitored during session, Premedicated before session, Repositioned     Home Living                          Prior Function            PT Goals (current goals can now be found in the care plan section) Progress towards PT goals: Progressing toward goals (slowly)    Frequency    Min 1X/week      PT Plan      Co-evaluation              AM-PAC PT "6 Clicks" Mobility   Outcome Measure  Help needed turning from your back to your side while in a flat bed without using bedrails?: A Little Help needed moving from lying on your back to sitting on the side of a flat bed without using bedrails?: A Little Help needed moving to and from a bed to a chair (including a wheelchair)?: A Little Help needed standing up from a chair using your arms (e.g., wheelchair or bedside chair)?: A Lot Help needed to walk in hospital room?: Total Help needed climbing 3-5 steps with a railing? : Total 6 Click Score: 13    End of Session Equipment Utilized During Treatment: Gait belt Activity Tolerance: Patient limited by fatigue Patient left: in bed;with call bell/phone within reach;with bed alarm set   PT Visit Diagnosis: Muscle weakness (generalized) (M62.81);Pain;Other abnormalities of gait and mobility (R26.89);Difficulty in walking, not elsewhere classified (R26.2) Pain - Right/Left: Left Pain - part of body: Leg     Time: 2841-3244 PT Time Calculation (min) (ACUTE ONLY): 31 min  Charges:    $Therapeutic Activity: 23-37 mins PT General Charges $$ ACUTE PT VISIT: 1 Visit                     Sheran Lawless, PT Acute Rehabilitation Services [phone] 10/27/2023    Elray Mcgregor 10/27/2023, 4:45 PM

## 2023-10-27 NOTE — Progress Notes (Signed)
Daily Progress Note Intern Pager: [phone]  Patient name: Elizabeth Schwartz Medical record number: [id number] Date of birth: 07/13/1946 Age: 77 y.o. Gender: female  Primary Care Provider: Storm Frisk, MD Consultants: Plastic surgery, Infectious Disease Code Status: DNR/DNI   Pt Overview and Major Events to Date:  11/3: Admitted to FMTS 11/11: I&D L thigh 11/17: Meropenem started 11/20: Left thigh wound evaluation under anesthesia w/ debridement 11/24: Daptomycin added due to MRSA in deep wound culture 11/26: PICC placed 12/03: Return to OR with Dr. Ladona Ridgel (plastic surgery) for debridement 12/06: Cultures growing Pseudomonas, switched meropenem to Zerbaxa   Assessment and Plan: Elizabeth Schwartz is a 77 y.o. female with a pertinent PMH of schizophrenia, dementia, COPD, and osteoporosis who presented with left thigh wound in setting of crush injury and was admitted for necrotizing myositis and osteomyelitis, currently s/p multiple debridements with plastics and with repeat wound cultures growing MDR Pseudomonas.  Now working on placement per [employer], insurance authorization pending.  Reportedly will not be authorized today and will likely need to stay until Monday. Did have mild downtrend in hemoglobin from one week ago, but normal range 7-8.5 and today's hemoglobin remains in that range.  Suspect anemia of chronic disease and will follow up outpatient. Creatinine stable, continue to encourage p.o. hydration. Assessment & Plan Necrotizing myositis Left thigh wound from a crush injury now s/p I&D on 11/11, 11/20, 12/3.  Now growing Pseudomonas in wound cultures with concern for carbapenem resistance, thus switched to Zerbaxa per ID pending sensitivities.  No further benefit of debridement at this time per plastic surgery, will follow up outpatient after discharge.  Antibiotic history: Zosyn 11/3-11/14 Vancomycin 11/3 Linezolid 11/4-11/15 Meropenem 11/14-12/6 Gentamicin TP  11/30-12/4 Daptomycin 11/24-? Zerbaxa 12/6-?  -Continue Zerbaxa until 12/23 per ID for now -Pain management: Scheduled tylenol 650mg  Q6h, increase oxycodone to 5mg  Q6h prn -Dressing change every shift -Weekly labs (BMP, CBC, CK) on Fridays -Plastic surgery following, no further plan for debridements or wound VAC at this time, appreciate recommendations -ID following, appreciate recommendations AKI (acute kidney injury) (HCC) Creatinine stable today, continue encouraging PO. -Nursing care orders encouraged 250 mL PO Q4h -AM BMP, trend creatinine Goals of care, counseling/discussion Disposition was pending APS case worker placement, but was evaluated by Palliative Care and determined to have capacity.  Stated DNR status and consented to transfer to SNF.  Will likely need to spend down given her assets to remain in LTC. -Prior auth pending Osteomyelitis of left femur (HCC) Early acute osteomyelitis found on CT.  IV abx treatment plan as above. Anemia Hemoglobin stable at 8.3>7.7 over the course of past week.  Likely chronic disease, poor nutrition. -Weekly CBC  Chronic and Stable Problems: None   FEN/GI: Regular PPx: Enoxaparin Dispo: SNF pending insurance authorization.  Subjective:  The patient reports that she is not experiencing any pain this morning.  She notes that she does have periods of increased pain in the evening and nighttime and has to wait for some time to get her pain meds.  Encouraged patient to ask for pain meds ahead of pain peaking to facilitate timely administration given that nursing staff have multiple patients.  Objective: Temp:  [97.6 F (36.4 C)-98.4 F (36.9 C)] 98 F (36.7 C) (12/13 0409) Pulse Rate:  [79-85] 83 (12/13 0409) Resp:  [14-16] 14 (12/13 0409) BP: (116-127)/(47-75) 125/75 (12/13 0409) SpO2:  [95 %-97 %] 96 % (12/13 0409)  Physical Exam: General: Thin female, deconditioned, chronically ill, resting  comfortably in bed, NAD, alert to self,  place, situation but not date.  Forgetful. HEENT: Mild mucosal pallor. Cardiovascular: Regular rate and rhythm. Normal S1/S2. No murmurs, rubs, or gallops appreciated. 2+ radial pulses. Pulmonary: Clear bilaterally to ascultation. No increased WOB, no accessory muscle usage on room air. No wheezes, rales, or crackles. Abdominal: Normoactive bowel sounds, nondistended. No tenderness to deep or light palpation. No rebound or guarding. No HSM. Skin: Warm and dry.  L thigh wound with dry dressing, no drainage or surrounding edema.  Appropriately tender to palpation. Extremities: No peripheral edema bilaterally.  Capillary refill <2 seconds.  Laboratory: Most recent CBC Lab Results  Component Value Date   WBC 6.1 10/27/2023   HGB 7.7 (L) 10/27/2023   HCT 25.0 (L) 10/27/2023   MCV 89.6 10/27/2023   PLT 291 10/27/2023   Most recent BMP    Latest Ref Rng & Units 10/27/2023    3:06 AM  BMP  Glucose 70 - 99 mg/dL 098   BUN 8 - 23 mg/dL 34   Creatinine 1.19 - 1.00 mg/dL 1.47   Sodium 829 - 562 mmol/L 142   Potassium 3.5 - 5.1 mmol/L 3.9   Chloride 98 - 111 mmol/L 112   CO2 22 - 32 mmol/L 20   Calcium 8.9 - 10.3 mg/dL 9.4     Other pertinent labs: -None  New Imaging/Diagnostic Tests: -None  Akira Perusse, MD 10/27/2023, 7:10 AM  PGY-1, [facility] FPTS Intern pager: [phone], text pages welcome Secure chat group [facility] Teaching Service

## 2023-10-28 ENCOUNTER — Other Ambulatory Visit (HOSPITAL_COMMUNITY): Payer: Self-pay

## 2023-10-28 DIAGNOSIS — L89224 Pressure ulcer of left hip, stage 4: Secondary | ICD-10-CM | POA: Diagnosis not present

## 2023-10-28 MED ORDER — CEFTOLOZANE-TAZOBACTAM IV (FOR PTA / DISCHARGE USE): 3.0000 g | Freq: Three times a day (TID) | INTRAVENOUS | 0 refills | Status: AC

## 2023-10-28 MED ORDER — ALENDRONATE SODIUM 70 MG PO TABS
70.0000 mg | ORAL_TABLET | ORAL | Status: DC
Start: 2023-10-28 — End: 2023-10-28

## 2023-10-28 MED ORDER — ALBUTEROL SULFATE HFA 108 (90 BASE) MCG/ACT IN AERS
INHALATION_SPRAY | RESPIRATORY_TRACT | 3 refills | Status: DC
  Filled 2023-10-28: qty 18, 25d supply, fill #0

## 2023-10-28 MED ORDER — CLOZAPINE 200 MG PO TABS
200.0000 mg | ORAL_TABLET | Freq: Every day | ORAL | 0 refills | Status: AC
  Filled 2023-10-28: qty 30, 30d supply, fill #0

## 2023-10-28 MED ORDER — MEMANTINE HCL 5 MG PO TABS: 5.0000 mg | ORAL_TABLET | Freq: Two times a day (BID) | ORAL | Status: AC

## 2023-10-28 MED ORDER — DAPTOMYCIN IV (FOR PTA / DISCHARGE USE ONLY): 700.0000 mg | INTRAVENOUS | 0 refills | Status: AC

## 2023-10-28 MED ORDER — OXYCODONE HCL 5 MG PO TABS: 5.0000 mg | ORAL_TABLET | Freq: Four times a day (QID) | ORAL | 0 refills | Status: DC | PRN

## 2023-10-28 MED ORDER — DONEPEZIL HCL 10 MG PO TABS
10.0000 mg | ORAL_TABLET | Freq: Every day | ORAL | 3 refills | Status: AC
  Filled 2023-10-28: qty 90, 90d supply, fill #0

## 2023-10-28 MED ORDER — OYSTER SHELL CALCIUM/D3 500-5 MG-MCG PO TABS
2.0000 | ORAL_TABLET | Freq: Every day | ORAL | Status: DC
  Administered 2023-10-28 – 2023-11-01 (×5): 2 via ORAL
  Filled 2023-10-28 (×5): qty 2

## 2023-10-28 NOTE — Progress Notes (Signed)
Daily Progress Note Intern Pager: [phone]  Patient name: Elizabeth Schwartz Medical record number: [id number] Date of birth: 06-26-1946 Age: 77 y.o. Gender: female  Primary Care Provider: Storm Frisk, MD Consultants: Plastic surgery, infectious disease Code Status: DNR  Pt Overview and Major Events to Date:  11/3: Admitted to FMTS 11/11: I&D L thigh 11/17: Meropenem started 11/20: Left thigh wound evaluation under anesthesia w/ debridement 11/24: Daptomycin added due to MRSA in deep wound culture 11/26: PICC placed 12/03: Return to OR with Dr. Ladona Ridgel (plastic surgery) for debridement 12/06: Cultures growing Pseudomonas, switched meropenem to Zerbaxa  Assessment and Plan: Elizabeth Schwartz is a 77 y.o. female with a pertinent PMH of schizophrenia, dementia, COPD, and osteoporosis who presented with left thigh wound in setting of crush injury and was admitted for necrotizing myositis and osteomyelitis, currently s/p multiple debridements with plastics and with repeat wound cultures growing MDR Pseudomonas.  Assessment & Plan Necrotizing myositis Left thigh wound from a crush injury now s/p I&D on 11/11, 11/20, 12/3.  Now growing Pseudomonas in wound cultures with concern for carbapenem resistance, thus switched to Zerbaxa per ID pending sensitivities.  No further benefit of debridement at this time per plastic surgery, will follow up outpatient after discharge.  Antibiotic history: Zosyn 11/3-11/14 Vancomycin 11/3 Linezolid 11/4-11/15 Meropenem 11/14-12/6 Gentamicin TP 11/30-12/4 Daptomycin 11/24-? Zerbaxa 12/6-?  -Continue Zerbaxa until 12/23 per ID for now -Pain management: Scheduled tylenol 650mg  Q6h, oxycodone to 5mg  Q6h prn -Dressing change every shift -Weekly labs (BMP, CBC, CK) on Fridays -Plastic surgery following, no further plan for debridements or wound VAC at this time, appreciate recommendations -ID following, appreciate recommendations Goals of care,  counseling/discussion Disposition was pending APS case worker placement, but was evaluated by Palliative Care and determined to have capacity.  Stated DNR status and consented to transfer to SNF.  Will likely need to spend down given her assets to remain in LTC. -Prior auth pending Anemia Hemoglobin stable at 8.3>7.7 over the course of past week.  Likely chronic disease, poor nutrition. -Weekly CBC -Ferrous sulfate 325 mg every other day AKI (acute kidney injury) (HCC) (Resolved: 10/28/2023) Creatinine stable today, continue encouraging PO. -Nursing care orders encouraged 250 mL PO Q4h  Chronic and Stable Problems:  Schizophrenia: Clozapine 200 mg nightly Dementia: Donepezil 10 mg nightly, memantine 5 mg twice daily COPD: Dulera twice daily Osteoporosis: Restart alendronate and calcium-vitamin D HLD: Zetia 10 mg daily, fenofibrate 160 mg daily  FEN/GI: Regular PPx: Lovenox Dispo:SNF  pending insurance authorization .   Subjective:  She notes pain is tolerable with pain regimen.  No further concerns.  Objective: Temp:  [98 F (36.7 C)-98.8 F (37.1 C)] 98 F (36.7 C) (12/14 0440) Pulse Rate:  [84-92] 84 (12/14 0440) Resp:  [16-18] 18 (12/14 0440) BP: (92-140)/(60-77) 139/60 (12/14 0610) SpO2:  [96 %-97 %] 97 % (12/14 0440) Physical Exam: General: Well-appearing, NAD Cardiovascular: RRR, murmurs auscultated Respiratory: CTAB, normal WOB Extremities: Left thigh wound with dry dressing  Laboratory: Most recent CBC Lab Results  Component Value Date   WBC 6.1 10/27/2023   HGB 7.7 (L) 10/27/2023   HCT 25.0 (L) 10/27/2023   MCV 89.6 10/27/2023   PLT 291 10/27/2023   Most recent BMP    Latest Ref Rng & Units 10/27/2023    3:06 AM  BMP  Glucose 70 - 99 mg/dL 235   BUN 8 - 23 mg/dL 34   Creatinine 5.73 - 1.00 mg/dL 2.20   Sodium  135 - 145 mmol/L 142   Potassium 3.5 - 5.1 mmol/L 3.9   Chloride 98 - 111 mmol/L 112   CO2 22 - 32 mmol/L 20   Calcium 8.9 - 10.3 mg/dL 9.4     Imaging/Diagnostic Tests: No recent imaging results  [name], DO 10/28/2023, 7:36 AM  PGY-3, [facility] FPTS Intern pager: [phone], text pages welcome Secure chat group [facility] Teaching Service

## 2023-10-28 NOTE — Plan of Care (Signed)
  Problem: Health Behavior/Discharge Planning: Goal: Ability to manage health-related needs will improve Outcome: Progressing   Problem: Metabolic: Goal: Ability to maintain appropriate glucose levels will improve Outcome: Progressing   Problem: Education: Goal: Knowledge of General Education information will improve Description: Including pain rating scale, medication(s)/side effects and non-pharmacologic comfort measures Outcome: Progressing   Problem: Health Behavior/Discharge Planning: Goal: Ability to manage health-related needs will improve Outcome: Progressing   Problem: Clinical Measurements: Goal: Ability to maintain clinical measurements within normal limits will improve Outcome: Progressing

## 2023-10-28 NOTE — TOC Progression Note (Signed)
Transition of Care [facility]) - Initial/Assessment Note    Patient Details  Name: Elizabeth Schwartz MRN: [id number] Date of Birth: 07-04-1946  Transition of [facility]) CM/SW Contact:    Ralene Bathe, LCSW Phone Number: 10/28/2023, 2:40 PM  Clinical Narrative:                 LCSW faxed hard copy of zerbaxa to [facility] and received successful transmission notice.  LCSW contacted Tammy/[place] and LM informing her that the hard copy had been faxed.  TOC following.   Expected Discharge Plan: Skilled Nursing Facility Barriers to Discharge: SNF Pending bed offer   Patient Goals and CMS Choice Patient states their goals for this hospitalization and ongoing recovery are:: To get out out the hospital CMS Medicare.gov Compare Post Acute Care list provided to:: Patient Choice offered to / list presented to : Patient      Expected Discharge Plan and Services In-house Referral: Clinical Social Work   Post Acute Care Choice: Skilled Nursing Facility Living arrangements for the past 2 months: Single Family Home                                      Prior Living Arrangements/Services Living arrangements for the past 2 months: Single Family Home Lives with:: Self Patient language and need for interpreter reviewed:: Yes        Need for Family Participation in Patient Care: Yes (Comment) Care giver support system in place?: Yes (comment) Current home services: Other (comment) (none) Criminal Activity/Legal Involvement Pertinent to Current Situation/Hospitalization: No - Comment as needed  Activities of Daily Living   ADL Screening (condition at time of admission) Independently performs ADLs?: No Does the patient have a NEW difficulty with bathing/dressing/toileting/self-feeding that is expected to last >3 days?: Yes (Initiates electronic notice to provider for possible OT consult) Does the patient have a NEW difficulty with getting in/out of bed, walking, or  climbing stairs that is expected to last >3 days?: Yes (Initiates electronic notice to provider for possible PT consult) Does the patient have a NEW difficulty with communication that is expected to last >3 days?: No Is the patient deaf or have difficulty hearing?: No Does the patient have difficulty seeing, even when wearing glasses/contacts?: No Does the patient have difficulty concentrating, remembering, or making decisions?: No  Permission Sought/Granted Permission sought to share information with : Case Manager Permission granted to share information with : Yes, Verbal Permission Granted  Share Information with NAME: niece Bonita Quin Peralta           Emotional Assessment Appearance:: Appears stated age Attitude/Demeanor/Rapport: Engaged Affect (typically observed): Pleasant, Calm Orientation: :  (Not charted in flowsheet) Alcohol / Substance Use: Not Applicable Psych Involvement: No (comment)  Admission diagnosis:  Necrotizing myositis [M60.80] Leg wound, left [S81.802A] Anemia, unspecified type [D64.9] Other acute osteomyelitis of left femur (HCC) [M86.152] Type 2 diabetes mellitus without complication, unspecified whether long term insulin use (HCC) [E11.9] Dementia, unspecified dementia severity, unspecified dementia type, unspecified whether behavioral, psychotic, or mood disturbance or anxiety (HCC) [F03.90] Patient Active Problem List   Diagnosis Date Noted   Anemia 10/01/2023   Type 2 diabetes mellitus without complication (HCC) 09/30/2023   Skin ulcer of left great toe (HCC) 09/28/2023   Left leg swelling 09/28/2023   Goals of care, counseling/discussion 09/25/2023   Necrotizing myositis 09/23/2023   Diabetes mellitus without complication (HCC) 09/23/2023  Dementia (HCC) 09/23/2023   Long term current use of clozapine 09/18/2023   Myositis of left thigh 09/18/2023   Osteomyelitis of left femur (HCC) 09/18/2023   Pressure injury of left thigh, stage 4 (HCC) 09/18/2023    Pressure injury of deep tissue of left thigh 09/17/2023   Rhabdomyolysis 08/10/2023   Transaminitis 08/10/2023   CAP (community acquired pneumonia) 08/10/2023   Lactic acidosis 08/10/2023   Elevated troponin 08/10/2023   Pressure injury of skin with suspected deep tissue injury 08/10/2023   Porokeratosis 01/02/2023   Dementia due to Alzheimer's disease (HCC) 08/11/2022   Statin myopathy 04/21/2022   Pain due to onychomycosis of toenails of both feet 02/22/2022   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) 11/22/2021   Chronic pain of right hip 09/15/2021   Chronic pain of left knee 09/15/2021   Chronic left shoulder pain 05/24/2021   Mixed hyperlipidemia 03/22/2021   Pincer nail deformity 11/03/2020   Chronic diarrhea 03/20/2019   Hypertension 08/22/2016   Normocytic anemia 08/22/2016   Major neurocognitive disorder due to multiple etiologies with behavioral disturbance (HCC) 08/22/2016   Osteoporosis 08/22/2016   Asthma, moderate persistent 09/08/2011   Diverticulosis 09/08/2011   Former smoker 09/08/2011   GERD 06/04/2007   PCP:  Storm Frisk, MD Pharmacy:   [facility] - [place], [place] - [address] [place] [address] Phone: [phone] Fax: [phone]  [facility] - [place], [place] - [address] [facility] [address] [place] [address] Phone: [phone] Fax: [phone]  [facility] [address] [place] [address] Phone: [phone] Fax: [phone]     Social Drivers of Health (SDOH) Social History: SDOH Screenings   Food Insecurity: No Food Insecurity (09/18/2023)  Housing: Low Risk  (10/26/2023)  Transportation Needs: No Transportation Needs (09/18/2023)  Utilities: Not At Risk (09/18/2023)  Alcohol Screen: Low Risk  (03/22/2023)  Depression (PHQ2-9): Low Risk  (07/13/2023)  Financial Resource Strain: Low Risk  (03/22/2023)   Physical Activity: Insufficiently Active (03/22/2023)  Social Connections: Socially Isolated (03/22/2023)  Stress: No Stress Concern Present (03/22/2023)  Tobacco Use: Medium Risk (10/17/2023)   SDOH Interventions:     Readmission Risk Interventions     No data to display

## 2023-10-28 NOTE — Progress Notes (Signed)
PHARMACIST - PHYSICIAN COMMUNICATION  CONCERNING: P&T Medication Policy Regarding Oral Bisphosphonates  RECOMMENDATION: Your order for alendronate (Fosamax), ibandronate (Boniva), or risedronate (Actonel) has been discontinued at this time.  If the patient's post-hospital medical condition warrants safe use of this class of drugs, please resume the pre-hospital regimen upon discharge.  DESCRIPTION:  Alendronate (Fosamax), ibandronate (Boniva), and risedronate (Actonel) can cause severe esophageal erosions in patients who are unable to remain upright at least 30 minutes after taking this medication.   Since brief interruptions in therapy are thought to have minimal impact on bone mineral density, the [facility] has established that bisphosphonate orders should be routinely discontinued during hospitalization.   To override this safety policy and permit administration of Boniva, Fosamax, or Actonel in the hospital, prescribers must write "DO NOT HOLD" in the comments section when placing the order for this class of medications.  Elizabeth Schwartz A. Levada Dy, PharmD, BCPS, FNKF Clinical Pharmacist [place] Please utilize Am[facility] for appropriate phone number to reach the unit pharmacist ([place])

## 2023-10-28 NOTE — Assessment & Plan Note (Signed)
Creatinine stable today, continue encouraging PO. -Nursing care orders encouraged 250 mL PO Q4h

## 2023-10-28 NOTE — Assessment & Plan Note (Signed)
Left thigh wound from a crush injury now s/p I&D on 11/11, 11/20, 12/3.  Now growing Pseudomonas in wound cultures with concern for carbapenem resistance, thus switched to Zerbaxa per ID pending sensitivities.  No further benefit of debridement at this time per plastic surgery, will follow up outpatient after discharge.  Antibiotic history: Zosyn 11/3-11/14 Vancomycin 11/3 Linezolid 11/4-11/15 Meropenem 11/14-12/6 Gentamicin TP 11/30-12/4 Daptomycin 11/24-? Zerbaxa 12/6-?  -Continue Zerbaxa until 12/23 per ID for now -Pain management: Scheduled tylenol 650mg  Q6h, oxycodone to 5mg  Q6h prn -Dressing change every shift -Weekly labs (BMP, CBC, CK) on Fridays -Plastic surgery following, no further plan for debridements or wound VAC at this time, appreciate recommendations -ID following, appreciate recommendations

## 2023-10-28 NOTE — Assessment & Plan Note (Signed)
Hemoglobin stable at 8.3>7.7 over the course of past week.  Likely chronic disease, poor nutrition. -Weekly CBC -Ferrous sulfate 325 mg every other day
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
## 2023-10-29 DIAGNOSIS — L89224 Pressure ulcer of left hip, stage 4: Secondary | ICD-10-CM | POA: Diagnosis not present

## 2023-10-29 LAB — CK: Total CK: 149 U/L (ref 38–234)

## 2023-10-29 NOTE — Assessment & Plan Note (Addendum)
 Early acute osteomyelitis found on CT. IV abx treatment plan as above.

## 2023-10-29 NOTE — Progress Notes (Signed)
Daily Progress Note Intern Pager: [phone]  Patient name: Elizabeth Schwartz Medical record number: [id number] Date of birth: Feb 06, 1946 Age: 77 y.o. Gender: female  Primary Care Provider: Storm Frisk, MD Consultants: Plastic Surgery, Infectious Disease Code Status: DNR  Pt Overview and Major Events to Date:  11/3: Admitted to FMTS 11/11: I&D L thigh 11/17: Meropenem started 11/20: Left thigh wound evaluation under anesthesia w/ debridement 11/24: Daptomycin added due to MRSA in deep wound culture 11/26: PICC placed 12/03: Return to OR with Dr. 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 Her hospital course has been complicated by multidrug resistant organisms growing in her wound. She is now awaiting SNF placement, though placement has been complicated by her need for advanced antibiotic therapies (Zerbaxa).   Assessment & Plan Necrotizing myositis Left thigh wound from a crush injury now s/p I&D on 11/11, 11/20, 12/3.  Now growing Pseudomonas in wound cultures with carbapenem resistance, thus switched to Zerbaxa per ID.  No further benefit of debridement at this time per plastic surgery, will follow up outpatient after discharge.  Antibiotic history: Zosyn 11/3-11/14 Vancomycin 11/3 Linezolid 11/4-11/15 Meropenem 11/14-12/6 Gentamicin TP 11/30-12/4 Daptomycin 11/24-? Zerbaxa 12/6-?  -Continue current antibiotics until 12/23 per ID  -Pain management: Scheduled tylenol 650mg  Q6h, oxycodone to 5mg  Q6h prn -Dressing change every shift -Weekly labs (BMP, CBC, CK) on Fridays -Plastic surgery following, no further plan for debridements or wound VAC at this time, appreciate recommendations -ID  following, appreciate recommendations Anemia Hemoglobin stable at 8.3>7.7 over the course of past week.  Likely chronic disease, poor nutrition. -Weekly CBC -Ferrous sulfate 325 mg every other day Osteomyelitis of left femur (HCC) Early acute osteomyelitis found on CT.  IV abx treatment plan as above.    FEN/GI: Regular PPx: Lovenox Dispo:SNF  pending placement . Barriers include need for advanced antibiotic therapies.   Subjective:  Elizabeth Schwartz feels well this morning. Eager to hear more regarding SNF placement. Tells me her pain is reasonable well controlled.   Objective: Temp:  [97.9 F (36.6 C)-98.9 F (37.2 C)] 97.9 F (36.6 C) (12/15 0446) Pulse Rate:  [76-86] 76 (12/15 0446) Resp:  [17-18] 18 (12/15 0446) BP: (132-139)/(51-62) 132/62 (12/15 0446) SpO2:  [94 %-97 %] 94 % (12/15 0446) Physical Exam: General: Sleeping on arrival, awakens easily to voice Cardiovascular: RRR, without murmur  Respiratory: Normal WOB on RA, lungs clear throughout  Extremities: Did not take down dressing, but appears clean, dry, and intact   Laboratory: Most recent CBC Lab Results  Component Value Date   WBC 6.1 10/27/2023   HGB 7.7 (L) 10/27/2023   HCT 25.0 (L) 10/27/2023   MCV 89.6 10/27/2023   PLT 291 10/27/2023   Most recent BMP    Latest Ref Rng & Units 10/27/2023    3:06 AM  BMP  Glucose 70 - 99 mg/dL 865   BUN 8 - 23 mg/dL 34   Creatinine 7.84 - 1.00 mg/dL 6.96   Sodium 295 - 284 mmol/L 142   Potassium 3.5 - 5.1 mmol/L 3.9   Chloride 98 - 111 mmol/L 112   CO2 22 - 32 mmol/L 20   Calcium 8.9 - 10.3 mg/dL 9.4     Imaging/Diagnostic Tests: No new imaging,  tests Alicia Amel, MD 10/29/2023, 5:37 AM  PGY-3, [facility] FPTS Intern pager: [phone], text pages welcome Secure chat group [facility] Teaching Service

## 2023-10-29 NOTE — Assessment & Plan Note (Addendum)
Hemoglobin stable at 8.3>7.7 over the course of past week.  Likely chronic disease, poor nutrition. -Weekly CBC -Ferrous sulfate 325 mg every other day

## 2023-10-29 NOTE — Plan of Care (Signed)

## 2023-10-29 NOTE — Assessment & Plan Note (Signed)
Left thigh wound from a crush injury now s/p I&D on 11/11, 11/20, 12/3.  Now growing Pseudomonas in wound cultures with carbapenem resistance, thus switched to Zerbaxa per ID.  No further benefit of debridement at this time per plastic surgery, will follow up outpatient after discharge.  Antibiotic history: Zosyn 11/3-11/14 Vancomycin 11/3 Linezolid 11/4-11/15 Meropenem 11/14-12/6 Gentamicin TP 11/30-12/4 Daptomycin 11/24-? Zerbaxa 12/6-?  -Continue current antibiotics until 12/23 per ID  -Pain management: Scheduled tylenol 650mg  Q6h, oxycodone to 5mg  Q6h prn -Dressing change every shift -Weekly labs (BMP, CBC, CK) on Fridays -Plastic surgery following, no further plan for debridements or wound VAC at this time, appreciate recommendations -ID following, appreciate recommendations

## 2023-10-30 DIAGNOSIS — L89224 Pressure ulcer of left hip, stage 4: Secondary | ICD-10-CM | POA: Diagnosis not present

## 2023-10-30 LAB — MISC LABCORP TEST (SEND OUT): Labcorp test code: 9985

## 2023-10-30 NOTE — Plan of Care (Signed)
  Problem: Education: Goal: Ability to describe self-care measures that may prevent or decrease complications (Diabetes Survival Skills Education) will improve Outcome: Progressing   Problem: Coping: Goal: Ability to adjust to condition or change in health will improve Outcome: Progressing   Problem: Education: Goal: Knowledge of General Education information will improve Description: Including pain rating scale, medication(s)/side effects and non-pharmacologic comfort measures Outcome: Progressing   Problem: Health Behavior/Discharge Planning: Goal: Ability to manage health-related needs will improve Outcome: Progressing
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
## 2023-10-30 NOTE — Assessment & Plan Note (Signed)
Left thigh wound now status post I&D on 11/11, 11/20, 12/3.  Wound cultures did grow Pseudomonas and found to be carbapenem resistant.  Patient switched to Zerbaxa per ID.  Left thigh wound from a crush injury now s/p I&D on 11/11, 11/20, 12/3.  Now growing Pseudomonas in wound cultures with carbapenem resistance, thus switched to Zerbaxa per ID. No further benefit of debridement at this time per plastic surgery, will follow up outpatient after discharge. -Continue current antibiotics until 12/23 per ID  -Pain management: Scheduled tylenol 650mg  Q6h, oxycodone to 5mg  Q6h prn -Dressing change every shift -Weekly labs (BMP, CBC, CK) on Fridays -ID following, appreciate recommendations   Antibiotic history: Zosyn 11/3-11/14 Vancomycin 11/3 Linezolid 11/4-11/15 Meropenem 11/14-12/6 Gentamicin TP 11/30-12/4 Daptomycin 11/24-? Zerbaxa 12/6-12/23

## 2023-10-30 NOTE — TOC Progression Note (Signed)
Transition of Care [facility]) - Progression Note    Patient Details  Name: Elizabeth Schwartz MRN: [id number] Date of Birth: [birth year]  Transition of Care [facility]) CM/SW Contact  Lorri Frederick, LCSW Phone Number: 10/30/2023, 10:31 AM  Clinical Narrative:    Message from [facility].  Auth still pending.    Expected Discharge Plan: Skilled Nursing Facility Barriers to Discharge: SNF Pending bed offer  Expected Discharge Plan and Services In-house Referral: Clinical Social Work   Post Acute Care Choice: Skilled Nursing Facility Living arrangements for the past 2 months: Single Family Home                                       Social Determinants of Health (SDOH) Interventions SDOH Screenings   Food Insecurity: No Food Insecurity (09/18/2023)  Housing: Low Risk  (10/26/2023)  Transportation Needs: No Transportation Needs (09/18/2023)  Utilities: Not At Risk (09/18/2023)  Alcohol Screen: Low Risk  (03/22/2023)  Depression (PHQ2-9): Low Risk  (07/13/2023)  Financial Resource Strain: Low Risk  (03/22/2023)  Physical Activity: Insufficiently Active (03/22/2023)  Social Connections: Socially Isolated (03/22/2023)  Stress: No Stress Concern Present (03/22/2023)  Tobacco Use: Medium Risk (10/17/2023)    Readmission Risk Interventions     No data to display

## 2023-10-30 NOTE — Progress Notes (Signed)
Mobility Specialist: Progress Note   10/30/23 1553  Mobility  Activity Transferred from bed to chair  Level of Assistance Contact guard assist, steadying assist  Assistive Device Front wheel walker  Range of Motion/Exercises Right leg;Left leg  Activity Response Tolerated well  Mobility Referral Yes  Mobility visit 1 Mobility  Mobility Specialist Start Time (ACUTE ONLY) 1145  Mobility Specialist Stop Time (ACUTE ONLY) 1207  Mobility Specialist Time Calculation (min) (ACUTE ONLY) 22 min    Pt was agreeable to mobility session - received in bed. Had c/o L knee pain and hip pain. Performed seated marches, knee kicks, R ankle pumps and core exercises. CG for transfer to the chair. Left in chair with all needs met, call bell in reach.   Maurene Capes Mobility Specialist Please contact via SecureChat or Rehab office at [phone]

## 2023-10-30 NOTE — Progress Notes (Addendum)
Daily Progress Note Intern Pager: [phone]  Patient name: Elizabeth Schwartz Medical record number: [id number] Date of birth: 1946/05/26 Age: 77 y.o. Gender: female  Primary Care Provider: Storm Frisk, MD Consultants: Plastic surgery, infectious disease Code Status: DNR  Pt Overview and Major Events to Date:  11/3: Admitted to FMTS 11/11: I&D L thigh 11/17: Meropenem started 11/20: Left thigh wound evaluation under anesthesia w/ debridement 11/24: Daptomycin added due to MRSA in deep wound culture 11/26: PICC placed 12/03: Return to OR with Dr. 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  She is now status post multiple debridements with plastic surgery.  Her hospital course has been complicated by multidrug-resistant organisms.  Patient has been improved for SNF placement and is medically stable for discharge.  Assessment & Plan Necrotizing myositis Left thigh wound now status post I&D on 11/11, 11/20, 12/3.  Wound cultures did grow Pseudomonas and found to be carbapenem resistant.  Patient switched to Zerbaxa per ID.  Left thigh wound from a crush injury now s/p I&D on 11/11, 11/20, 12/3.  Now growing Pseudomonas in wound cultures with carbapenem resistance, thus switched to Zerbaxa per ID. No further benefit of debridement at this time per plastic surgery, will follow up outpatient after discharge. -Continue current antibiotics until 12/23 per ID  -Pain management: Scheduled tylenol 650mg  Q6h, oxycodone to 5mg  Q6h prn -Dressing change every shift -Weekly labs (BMP, CBC, CK) on Fridays -ID following, appreciate recommendations   Antibiotic history: Zosyn 11/3-11/14 Vancomycin 11/3 Linezolid 11/4-11/15 Meropenem  11/14-12/6 Gentamicin TP 11/30-12/4 Daptomycin 11/24-? Zerbaxa 12/6-12/23    Chronic and Stable Problems:  Anemia: Patient has history of anemia with hemoglobin of 7.7 at last check.  Likely chronic and secondary to poor nutrition.  Every other day Schizophrenia: Clozapine 200 mg nightly Dementia: Donepezil 10 mg nightly, memantine 5 mg twice daily COPD: Dulera twice daily Osteoporosis: Restart alendronate and calcium-vitamin D HLD: Zetia 10 mg daily, fenofibrate 160 mg daily  FEN/GI: Regular PPx: Lovenox Dispo: SNF pending placement  Subjective:  Patient is feeling well this morning.  She is ready to leave the hospital  Objective: Temp:  [97.6 F (36.4 C)-98.9 F (37.2 C)] 97.7 F (36.5 C) (12/16 0809) Pulse Rate:  [75-82] 78 (12/16 0809) Resp:  [16-18] 17 (12/16 0809) BP: (118-145)/(54-65) 145/65 (12/16 0809) SpO2:  [95 %-100 %] 95 % (12/16 0818) Physical Exam: General: Awake, no acute distress Cardiovascular: Regular rate and rhythm, no murmurs rubs or gallops Respiratory: To auscultation bilaterally, normal work of breathing on room air Abdomen: Soft, nontender Extremities: Dressing appears clean, dry, intact  Laboratory: Most recent CBC Lab Results  Component Value Date   WBC 6.1 10/27/2023   HGB 7.7 (L) 10/27/2023   HCT 25.0 (L) 10/27/2023   MCV 89.6 10/27/2023   PLT 291 10/27/2023   Most recent BMP    Latest Ref Rng & Units 10/27/2023    3:06 AM  BMP  Glucose 70 - 99 mg/dL 725   BUN 8 - 23 mg/dL 34   Creatinine 3.66 - 1.00 mg/dL 4.40   Sodium 347 - 425 mmol/L 142   Potassium 3.5 - 5.1 mmol/L 3.9   Chloride 98 - 111 mmol/L 112   CO2 22 - 32 mmol/L 20   Calcium 8.9 -  10.3 mg/dL 9.4      Imaging/Diagnostic Tests: No new images  Georg Ruddle Elizabeth Aldous, MD 10/30/2023, 1:01 PM  PGY-1, [facility] Family Medicine FPTS Intern pager: [phone], text pages welcome Secure chat group [facility] Teaching Service

## 2023-10-30 NOTE — Progress Notes (Signed)
Physical Therapy Treatment Patient Details Name: Elizabeth Schwartz MRN: [id number] DOB: 06-Nov-1946 Today's Date: 10/30/2023   History of Present Illness Pt is a 77 y/o F admitted on 09/17/23 after being advised by [facility] nurse to go to the ED after assessing pt's wound. CT showed necrotic myositis, pt also found to have acute osteomyelitis. Pt found to have symptomatic orthostatic hypotension during therapies 11/6-11/8, and consistently following week.  S/P I&D 11/11. 11/20, 12/3.  PMH: anemia, COPD, dementia, depression, DM, HLD, HTN, osteoporosis, schizo-affective, SOB, syncope.    PT Comments  Pt progressing well towards her physical therapy goals this session. Received up in chair and motivated to participate. Ambulating 30 ft x 2 with a walker and chair follow. Continues with left sided gait abnormalities and decreased weightbearing due to pain. Patient will benefit from continued inpatient follow up therapy, <3 hours/day.    If plan is discharge home, recommend the following: A little help with bathing/dressing/bathroom;Assistance with cooking/housework;Supervision due to cognitive status;Direct supervision/assist for financial management;Assist for transportation;Help with stairs or ramp for entrance;Direct supervision/assist for medications management;A lot of help with walking and/or transfers   Can travel by private vehicle     No  Equipment Recommendations  Rolling walker (2 wheels);BSC/3in1;Wheelchair (measurements PT);Wheelchair cushion (measurements PT)    Recommendations for Other Services       Precautions / Restrictions Precautions Precautions: Fall Other Brace: L foot drop brace, TED hose, abdominal binder Restrictions Weight Bearing Restrictions Per Provider Order: No     Mobility  Bed Mobility               General bed mobility comments: OOB in chair upon entry    Transfers Overall transfer level: Needs assistance Equipment used: Rolling walker (2  wheels) Transfers: Sit to/from Stand Sit to Stand: Min assist           General transfer comment: Light minA to initially steady upon power up    Ambulation/Gait Ambulation/Gait assistance: Min assist Gait Distance (Feet): 60 Feet (30", 30") Assistive device: Rolling walker (2 wheels) Gait Pattern/deviations: Step-to pattern, Trunk flexed, Decreased dorsiflexion - left, Decreased weight shift to left, Decreased step length - left, Antalgic, Drifts right/left Gait velocity: decreased Gait velocity interpretation: <1.31 ft/sec, indicative of household ambulator   General Gait Details: Verbal cues for keeping L foot flat and in neutral positioning and for activity pacing. chair follow utilized   Comptroller Bed    Modified Rankin (Stroke Patients Only)       Balance Overall balance assessment: Needs assistance Sitting-balance support: No upper extremity supported, Feet supported Sitting balance-Leahy Scale: Good     Standing balance support: Bilateral upper extremity supported, During functional activity Standing balance-Leahy Scale: Poor Standing balance comment: reliant on RW for support                            Cognition Arousal: Alert Behavior During Therapy: WFL for tasks assessed/performed Overall Cognitive Status: History of cognitive impairments - at baseline                                          Exercises General Exercises - Lower Extremity Quad Sets: Left, 5 reps, Seated, Other (comment) (into towel roll) Long Arc Quad:  Both, 5 reps, Seated    General Comments        Pertinent Vitals/Pain Pain Assessment Pain Assessment: Faces Faces Pain Scale: Hurts even more Pain Location: proximal LLE Pain Descriptors / Indicators: Discomfort, Aching Pain Intervention(s): Limited activity within patient's tolerance, Monitored during session    Home Living                           Prior Function            PT Goals (current goals can now be found in the care plan section) Acute Rehab PT Goals Patient Stated Goal: to go to rehab, less pain PT Goal Formulation: With patient Time For Goal Achievement: 11/13/23 Potential to Achieve Goals: Fair Progress towards PT goals: Progressing toward goals    Frequency    Min 1X/week      PT Plan      Co-evaluation              AM-PAC PT "6 Clicks" Mobility   Outcome Measure  Help needed turning from your back to your side while in a flat bed without using bedrails?: A Little Help needed moving from lying on your back to sitting on the side of a flat bed without using bedrails?: A Little Help needed moving to and from a bed to a chair (including a wheelchair)?: A Little Help needed standing up from a chair using your arms (e.g., wheelchair or bedside chair)?: A Little Help needed to walk in hospital room?: A Little Help needed climbing 3-5 steps with a railing? : Total 6 Click Score: 16    End of Session Equipment Utilized During Treatment: Gait belt Activity Tolerance: Patient tolerated treatment well Patient left: in chair;with call bell/phone within reach;with chair alarm set Nurse Communication: Mobility status PT Visit Diagnosis: Muscle weakness (generalized) (M62.81);Pain;Other abnormalities of gait and mobility (R26.89);Difficulty in walking, not elsewhere classified (R26.2) Pain - Right/Left: Left Pain - part of body: Leg     Time: 7628-3151 PT Time Calculation (min) (ACUTE ONLY): 20 min  Charges:    $Gait Training: 8-22 mins PT General Charges $$ ACUTE PT VISIT: 1 Visit                     Lillia Pauls, PT, DPT Acute Rehabilitation Services Office [phone]    Norval Morton 10/30/2023, 4:18 PM

## 2023-10-30 NOTE — Progress Notes (Signed)
IV Team RN to bedside for PICC dressing change, pt just put up to chair by PT. Notified RN via secure chat to place new consult when patient is back in bed.

## 2023-10-31 DIAGNOSIS — L89224 Pressure ulcer of left hip, stage 4: Secondary | ICD-10-CM | POA: Diagnosis not present

## 2023-10-31 NOTE — Progress Notes (Addendum)
Daily Progress Note Intern Pager: [phone]  Patient name: Elizabeth Schwartz Medical record number: [id number] Date of birth: 09-27-1946 Age: 77 y.o. Gender: female  Primary Care Provider: Storm Frisk, MD Consultants: Plastic surgery, infectious disease Code Status: DNR  Pt Overview and Major Events to Date:  11/3: Admitted to [facility]MT[facility] 11/11: I&D L thigh 11/17: Meropenem started 11/20: Left thigh wound evaluation under anesthesia w/ debridement 11/24: Daptomycin added due to MRSA in deep wound culture 11/26: PICC placed 12/03: Return to OR with Dr. 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  She is now status post multiple debridements with plastic surgery.  Her hospital course has been complicated by multidrug-resistant organisms.  Patient has been improved for SNF placement and is medically stable for discharge.   Assessment & Plan Necrotizing myositis Left thigh wound now status post I&D on 11/11, 11/20, 12/3.  Wound cultures did grow Pseudomonas and found to be carbapenem resistant.  Patient switched to Zerbaxa per ID.  Left thigh wound from a crush injury now s/p I&D on 11/11, 11/20, 12/3.  Now growing Pseudomonas in wound cultures with carbapenem resistance, thus switched to Zerbaxa per ID. No further benefit of debridement at this time per plastic surgery, will follow up outpatient after discharge. -Continue current antibiotics until 12/23 per ID  -Pain management: Scheduled tylenol 650mg  Q6h, oxycodone to 5mg  Q6h prn- has been doing well on this regimen  -Dressing change every shift -Weekly labs (BMP, CBC, CK) on Fridays -ID following, appreciate recommendations   Antibiotic history: Zosyn 11/3-11/14 Vancomycin 11/3 Linezolid  11/4-11/15 Meropenem 11/14-12/6 Gentamicin TP 11/30-12/4 Daptomycin 11/24-12/23 Zerbaxa 12/6-12/23    Chronic and Stable Problems:  Anemia: Patient has history of anemia with hemoglobin of 7.7 at last check.  Likely chronic and secondary to poor nutrition.  Every other day Schizophrenia: Clozapine 200 mg nightly Dementia: Donepezil 10 mg nightly, memantine 5 mg twice daily COPD: Dulera twice daily Osteoporosis: Restart alendronate and calcium-vitamin D HLD: Zetia 10 mg daily, fenofibrate 160 mg daily  FEN/GI: Regular PPx: Lovenox Dispo: SNF pending placement  Subjective:  NAD. Patient expresses some increase in urination but thinks this may be 2/2 to how much fluid she's getting with the antibiotics  Objective: Temp:  [97.5 F (36.4 C)-98.6 F (37 C)] 97.5 F (36.4 C) (12/17 1335) Pulse Rate:  [81-95] 95 (12/17 1335) Resp:  [16-17] 17 (12/17 1335) BP: (108-123)/(42-104) 123/104 (12/17 1335) SpO2:  [95 %-99 %] 98 % (12/17 1335) Physical Exam: General: awake, NAD, sitting in chair  Cardiovascular:RRR, no m/r/g Respiratory: CTAB  Extremities: Dressing appears clean, dry, intact  Laboratory: Most recent CBC Lab Results  Component Value Date   WBC 6.1 10/27/2023   HGB 7.7 (L) 10/27/2023   HCT 25.0 (L) 10/27/2023   MCV 89.6 10/27/2023   PLT 291 10/27/2023   Most recent BMP    Latest Ref Rng & Units 10/27/2023    3:06 AM  BMP  Glucose 70 - 99 mg/dL 295   BUN 8 - 23 mg/dL 34   Creatinine 2.84 - 1.00 mg/dL 1.32   Sodium 440 - 102 mmol/L 142   Potassium 3.5 - 5.1 mmol/L 3.9   Chloride 98 - 111 mmol/L 112   CO2 22 - 32 mmol/L 20   Calcium 8.9 -  10.3 mg/dL 9.4      Imaging/Diagnostic Tests: No new images  Georg Ruddle Anacleto Batterman, MD 10/31/2023, 1:42 PM  PGY-1, [facility] FPTS Intern pager: [phone], text pages welcome Secure chat group [facility] Teaching Service

## 2023-10-31 NOTE — Assessment & Plan Note (Signed)
Left thigh wound now status post I&D on 11/11, 11/20, 12/3.  Wound cultures did grow Pseudomonas and found to be carbapenem resistant.  Patient switched to Zerbaxa per ID.  Left thigh wound from a crush injury now s/p I&D on 11/11, 11/20, 12/3.  Now growing Pseudomonas in wound cultures with carbapenem resistance, thus switched to Zerbaxa per ID. No further benefit of debridement at this time per plastic surgery, will follow up outpatient after discharge. -Continue current antibiotics until 12/23 per ID  -Pain management: Scheduled tylenol 650mg  Q6h, oxycodone to 5mg  Q6h prn- has been doing well on this regimen  -Dressing change every shift -Weekly labs (BMP, CBC, CK) on Fridays -ID following, appreciate recommendations   Antibiotic history: Zosyn 11/3-11/14 Vancomycin 11/3 Linezolid 11/4-11/15 Meropenem 11/14-12/6 Gentamicin TP 11/30-12/4 Daptomycin 11/24-12/23 Zerbaxa 12/6-12/23

## 2023-10-31 NOTE — Progress Notes (Signed)
Occupational Therapy Treatment Patient Details Name: Elizabeth Schwartz MRN: [id number] DOB: [birth year] Today's Date: 10/31/2023   History of present illness Pt is a 77 y/o F admitted on 09/17/23 after being advised by [facility] nurse to go to the ED after assessing pt's wound. CT showed necrotic myositis, pt also found to have acute osteomyelitis. Pt found to have symptomatic orthostatic hypotension during therapies 11/6-11/8, and consistently following week.  S/P I&D 11/11. 11/20, 12/3.  PMH: anemia, COPD, dementia, depression, DM, HLD, HTN, osteoporosis, schizo-affective, SOB, syncope.   OT comments  Patient making good gains with supine to sitting on EOB with min assist. Patient continues to require min assist for transfer to recliner due to LLE pain. Patient performed grooming seated and dressing seated and standing with CGA while standing. Patient requires cues for hand positioning when standing and able to perform reaching tasks with one extremity support to address standing ADLs. Patient will benefit from continued inpatient follow up therapy, <3 hours/day for further OT treatment to address bathing, dressing, and functional transfers. Acute OT to continue to follow.       If plan is discharge home, recommend the following:  A little help with walking and/or transfers;Assistance with cooking/housework;Direct supervision/assist for financial management;Direct supervision/assist for medications management;Assist for transportation;Help with stairs or ramp for entrance;A little help with bathing/dressing/bathroom   Equipment Recommendations  Other (comment) (defer)    Recommendations for Other Services      Precautions / Restrictions Precautions Precautions: Fall Required Braces or Orthoses: Other Brace Other Brace: L foot drop brace, TED hose, abdominal binder Restrictions Weight Bearing Restrictions Per Provider Order: No       Mobility Bed Mobility Overal bed mobility: Needs  Assistance Bed Mobility: Supine to Sit     Supine to sit: Min assist, HOB elevated, Used rails     General bed mobility comments: min assist to scoot towards EOB    Transfers Overall transfer level: Needs assistance Equipment used: Rolling walker (2 wheels) Transfers: Sit to/from Stand, Bed to chair/wheelchair/BSC Sit to Stand: Min assist     Step pivot transfers: Min assist     General transfer comment: transfer from EOB to recliner with complaints of LLE pain     Balance Overall balance assessment: Needs assistance Sitting-balance support: No upper extremity supported, Feet supported Sitting balance-Leahy Scale: Good Sitting balance - Comments: on EOB   Standing balance support: Single extremity supported, Bilateral upper extremity supported, During functional activity Standing balance-Leahy Scale: Poor Standing balance comment: performd reaching tasks while standing with CGA for balance                           ADL either performed or assessed with clinical judgement   ADL Overall ADL's : Needs assistance/impaired     Grooming: Set up;Sitting           Upper Body Dressing : Set up;Sitting   Lower Body Dressing: Moderate assistance Lower Body Dressing Details (indicate cue type and reason): to donn compression stock and socks Toilet Transfer: Minimal assistance Toilet Transfer Details (indicate cue type and reason): simulated                Extremity/Trunk Assessment              Vision       Perception     Praxis      Cognition Arousal: Alert Behavior During Therapy: WFL for tasks assessed/performed Overall Cognitive Status: History  of cognitive impairments - at baseline                                          Exercises      Shoulder Instructions       General Comments      Pertinent Vitals/ Pain       Pain Assessment Pain Assessment: Faces Faces Pain Scale: Hurts even more Pain Location:  proximal LLE Pain Descriptors / Indicators: Discomfort, Aching Pain Intervention(s): Limited activity within patient's tolerance, Monitored during session, Repositioned  Home Living                                          Prior Functioning/Environment              Frequency  Min 1X/week        Progress Toward Goals  OT Goals(current goals can now be found in the care plan section)  Progress towards OT goals: Progressing toward goals  Acute Rehab OT Goals Patient Stated Goal: get better OT Goal Formulation: With patient Time For Goal Achievement: 11/01/23 Potential to Achieve Goals: Good ADL Goals Pt Will Perform Grooming: with supervision;standing Pt Will Perform Lower Body Dressing: with supervision;sit to/from stand;with adaptive equipment Pt Will Transfer to Toilet: with supervision;ambulating Pt Will Perform Toileting - Clothing Manipulation and hygiene: with supervision;sit to/from stand;sitting/lateral leans Additional ADL Goal #1: Pt will utilize compensatory memory strategies to optimize recall for ADL and iADL routine. Additional ADL Goal #2: Pt will verbalize 3 fall prevention techniques to optimize safety and independence during ADLs.  Plan      Co-evaluation                 AM-PAC OT "6 Clicks" Daily Activity     Outcome Measure   Help from another person eating meals?: A Little Help from another person taking care of personal grooming?: A Little Help from another person toileting, which includes using toliet, bedpan, or urinal?: A Little Help from another person bathing (including washing, rinsing, drying)?: A Little Help from another person to put on and taking off regular upper body clothing?: A Little Help from another person to put on and taking off regular lower body clothing?: A Lot 6 Click Score: 17    End of Session Equipment Utilized During Treatment: Gait belt;Rolling walker (2 wheels)  OT Visit Diagnosis: Other  abnormalities of gait and mobility (R26.89);Muscle weakness (generalized) (M62.81);Pain;Other symptoms and signs involving cognitive function Pain - Right/Left: Left Pain - part of body: Leg   Activity Tolerance Patient tolerated treatment well   Patient Left in chair;with call bell/phone within reach;with chair alarm set   Nurse Communication Mobility status        Time: [phone] OT Time Calculation (min): 24 min  Charges: OT General Charges $OT Visit: 1 Visit OT Treatments $Self Care/Home Management : 8-22 mins $Therapeutic Activity: 8-22 mins  Alfonse Flavors, OTA Acute Rehabilitation Services  Office [phone]   Dewain Penning 10/31/2023, 9:04 AM

## 2023-10-31 NOTE — Progress Notes (Signed)
Mobility Specialist: Progress Note   10/31/23 1605  Mobility  Activity Transferred from bed to chair  Level of Assistance Contact guard assist, steadying assist  Assistive Device Front wheel walker  Activity Response Tolerated well  Mobility Referral Yes  Mobility visit 1 Mobility  Mobility Specialist Start Time (ACUTE ONLY) 1220  Mobility Specialist Stop Time (ACUTE ONLY) 1230  Mobility Specialist Time Calculation (min) (ACUTE ONLY) 10 min    Pt was agreeable to mobility session - received in bed. Had c/o throbbing LLE pain upon getting EOB. Light minA for STS, CG for pivot transfer. Left in chair with all needs met, call bell in reach. Chair alarm on.   Maurene Capes Mobility Specialist Please contact via SecureChat or Rehab office at [phone]

## 2023-11-01 ENCOUNTER — Inpatient Hospital Stay (HOSPITAL_COMMUNITY): Payer: Medicare Other

## 2023-11-01 DIAGNOSIS — M6281 Muscle weakness (generalized): Secondary | ICD-10-CM | POA: Diagnosis not present

## 2023-11-01 DIAGNOSIS — S7712XA Crushing injury of left thigh, initial encounter: Secondary | ICD-10-CM | POA: Diagnosis not present

## 2023-11-01 DIAGNOSIS — M86652 Other chronic osteomyelitis, left thigh: Secondary | ICD-10-CM | POA: Diagnosis not present

## 2023-11-01 DIAGNOSIS — E119 Type 2 diabetes mellitus without complications: Secondary | ICD-10-CM | POA: Diagnosis not present

## 2023-11-01 DIAGNOSIS — W19XXXA Unspecified fall, initial encounter: Secondary | ICD-10-CM | POA: Diagnosis not present

## 2023-11-01 DIAGNOSIS — R531 Weakness: Secondary | ICD-10-CM | POA: Diagnosis not present

## 2023-11-01 DIAGNOSIS — M6080 Other myositis, unspecified site: Secondary | ICD-10-CM | POA: Diagnosis not present

## 2023-11-01 DIAGNOSIS — J449 Chronic obstructive pulmonary disease, unspecified: Secondary | ICD-10-CM | POA: Diagnosis not present

## 2023-11-01 DIAGNOSIS — M25552 Pain in left hip: Secondary | ICD-10-CM | POA: Diagnosis not present

## 2023-11-01 DIAGNOSIS — D649 Anemia, unspecified: Secondary | ICD-10-CM | POA: Diagnosis not present

## 2023-11-01 DIAGNOSIS — K219 Gastro-esophageal reflux disease without esophagitis: Secondary | ICD-10-CM | POA: Diagnosis not present

## 2023-11-01 DIAGNOSIS — L89224 Pressure ulcer of left hip, stage 4: Secondary | ICD-10-CM | POA: Diagnosis not present

## 2023-11-01 DIAGNOSIS — L97529 Non-pressure chronic ulcer of other part of left foot with unspecified severity: Secondary | ICD-10-CM | POA: Diagnosis not present

## 2023-11-01 DIAGNOSIS — M25512 Pain in left shoulder: Secondary | ICD-10-CM | POA: Diagnosis not present

## 2023-11-01 DIAGNOSIS — M7989 Other specified soft tissue disorders: Secondary | ICD-10-CM | POA: Diagnosis not present

## 2023-11-01 DIAGNOSIS — M810 Age-related osteoporosis without current pathological fracture: Secondary | ICD-10-CM | POA: Diagnosis not present

## 2023-11-01 DIAGNOSIS — R2689 Other abnormalities of gait and mobility: Secondary | ICD-10-CM | POA: Diagnosis not present

## 2023-11-01 DIAGNOSIS — Z743 Need for continuous supervision: Secondary | ICD-10-CM | POA: Diagnosis not present

## 2023-11-01 DIAGNOSIS — M79652 Pain in left thigh: Secondary | ICD-10-CM | POA: Diagnosis not present

## 2023-11-01 DIAGNOSIS — M60852 Other myositis, left thigh: Secondary | ICD-10-CM | POA: Diagnosis not present

## 2023-11-01 DIAGNOSIS — E785 Hyperlipidemia, unspecified: Secondary | ICD-10-CM | POA: Diagnosis not present

## 2023-11-01 MED ORDER — OXYCODONE HCL 5 MG PO TABS: 5.0000 mg | ORAL_TABLET | Freq: Four times a day (QID) | ORAL | 0 refills | Status: DC | PRN

## 2023-11-01 NOTE — Assessment & Plan Note (Signed)
Hemoglobin stable at 8.3>7.7 over the course of past week.  Likely chronic disease, poor nutrition. -Weekly CBC -Ferrous sulfate 325 mg every other day

## 2023-11-01 NOTE — Progress Notes (Signed)
Lower extremity venous duplex completed. Please see CV Procedures for preliminary results.  Shona Simpson, RVT 11/01/23 3:44 PM

## 2023-11-01 NOTE — Discharge Planning (Signed)
Patient alert and oriented to person, place, and situation. Discharge teaching given and provided to patient and Chrissie Noa, Charity fundraiser at [facility]. Patient transported to [facility] via [place].

## 2023-11-01 NOTE — TOC Progression Note (Addendum)
Transition of Care [facility]) - Progression Note    Patient Details  Name: Elizabeth Schwartz MRN: [id number] Date of Birth: 12-11-1945  Transition of Care [facility] CM/SW Contact  Lorri Frederick, LCSW Phone Number: 11/01/2023, 9:59 AM  Clinical Narrative:   SNF auth approved in [place]: [id number], [id number], 3 days: 12/17-12/19.  CSW spoke with Tammy/[facility]--she will confirm that abx is at the SNF for today.   1615: Message from Tammy, all set to receive pt today.   Expected Discharge Plan: Skilled Nursing Facility Barriers to Discharge: SNF Pending bed offer  Expected Discharge Plan and Services In-house Referral: Clinical Social Work   Post Acute Care Choice: Skilled Nursing Facility Living arrangements for the past 2 months: Single Family Home                                       Social Determinants of Health (SDOH) Interventions SDOH Screenings   Food Insecurity: No Food Insecurity (09/18/2023)  Housing: Low Risk  (10/26/2023)  Transportation Needs: No Transportation Needs (09/18/2023)  Utilities: Not At Risk (09/18/2023)  Alcohol Screen: Low Risk  (03/22/2023)  Depression (PHQ2-9): Low Risk  (07/13/2023)  Financial Resource Strain: Low Risk  (03/22/2023)  Physical Activity: Insufficiently Active (03/22/2023)  Social Connections: Socially Isolated (03/22/2023)  Stress: No Stress Concern Present (03/22/2023)  Tobacco Use: Medium Risk (10/17/2023)    Readmission Risk Interventions     No data to display

## 2023-11-01 NOTE — Assessment & Plan Note (Signed)
Left thigh wound now status post I&D on 11/11, 11/20, 12/3.  Wound cultures did grow Pseudomonas and found to be carbapenem resistant.  Patient switched to Zerbaxa per ID.  Left thigh wound from a crush injury now s/p I&D on 11/11, 11/20, 12/3.  Now growing Pseudomonas in wound cultures with carbapenem resistance, thus switched to Zerbaxa per ID. No further benefit of debridement at this time per plastic surgery, will follow up outpatient after discharge. -Continue current antibiotics until 12/23 per ID  -Pain management: Scheduled tylenol 650mg  Q6h, oxycodone to 5mg  Q6h prn- has been doing well on this regimen  -Dressing change every shift -Weekly labs (BMP, CBC, CK) on Fridays -ID following, appreciate recommendations   Antibiotic history: Zosyn 11/3-11/14 Vancomycin 11/3 Linezolid 11/4-11/15 Meropenem 11/14-12/6 Gentamicin TP 11/30-12/4 Daptomycin 11/24-12/23 Zerbaxa 12/6-12/23

## 2023-11-01 NOTE — Progress Notes (Signed)
Daily Progress Note Intern Pager: [phone]  Patient name: Elizabeth Schwartz Medical record number: [id number] Date of birth: 04/19/1946 Age: 77 y.o. Gender: female  Primary Care Provider: Storm Frisk, MD Consultants: Plastic surgery, infectious disease Code Status: DNR  Pt Overview and Major Events to Date:  11/3: Admitted to [facility] 11/11: I&D L thigh 11/17: Meropenem started 11/20: Left thigh wound evaluation under anesthesia w/ debridement 11/24: Daptomycin added due to MRSA in deep wound culture 11/26: PICC placed 12/03: Return to OR with Dr. 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  She is now status post multiple debridements with plastic surgery.  Her hospital course has been complicated by multidrug-resistant organisms.  Patient has been improved for SNF placement and is medically stable for discharge. Patient complaining of some pain on left lower leg with pain on calf squeeze. Will order US DVT to rule out thrombus, though have low suspicion given she has been on prophylaxis.   Assessment & Plan Necrotizing myositis Left thigh wound now status post I&D on 11/11, 11/20, 12/3.  Wound cultures did grow Pseudomonas and found to be carbapenem resistant.  Patient switched to Zerbaxa per ID.  Left thigh wound from a crush injury now s/p I&D on 11/11, 11/20, 12/3.  Now growing Pseudomonas in wound cultures with carbapenem resistance, thus switched to Zerbaxa per ID. No further benefit of debridement at this time per plastic surgery, will follow up outpatient after discharge. -Continue current antibiotics until 12/23 per ID  -Pain management: Scheduled tylenol 650mg  Q6h, oxycodone to 5mg  Q6h prn- has been doing well on this regimen   -Dressing change every shift -Weekly labs (BMP, CBC, CK) on Fridays -ID following, appreciate recommendations   Antibiotic history: Zosyn 11/3-11/14 Vancomycin 11/3 Linezolid 11/4-11/15 Meropenem 11/14-12/6 Gentamicin TP 11/30-12/4 Daptomycin 11/24-12/23 Zerbaxa 12/6-12/23   Osteomyelitis of left femur (HCC) Early acute osteomyelitis found on CT.  IV abx treatment plan as above. Goals of care, counseling/discussion Disposition was pending APS case worker placement, but was evaluated by Palliative Care and determined to have capacity.  Stated DNR status and consented to transfer to SNF.  Will likely need to spend down given her assets to remain in LTC. -Prior auth pending Anemia Hemoglobin stable at 8.3>7.7 over the course of past week.  Likely chronic disease, poor nutrition. -Weekly CBC -Ferrous sulfate 325 mg every other day  Chronic and Stable Problems:  Anemia: Patient has history of anemia with hemoglobin of 7.7 at last check.  Likely chronic and secondary to poor nutrition.  Every other day Schizophrenia: Clozapine 200 mg nightly Dementia: Donepezil 10 mg nightly, memantine 5 mg twice daily COPD: Dulera twice daily Osteoporosis: Restart alendronate and calcium-vitamin D HLD: Zetia 10 mg daily, fenofibrate 160 mg daily  FEN/GI: Regular PPx: Lovenox Dispo: SNF pending placement  Subjective:  Patient is feeling well this morning.  She is ready to leave the hospital  Objective: Temp:  [97.5 F (36.4 C)-98.6 F (37 C)] 97.5 F (36.4 C) (12/18 0707) Pulse Rate:  [86-98] 87 (12/18 0707) Resp:  [17] 17 (12/18 0526) BP: (123-151)/(65-104) 149/65 (12/18 0707) SpO2:  [94 %-99 %] 99 % (12/18 0707) Physical Exam: General: asleep in bed, NAD  Cardiovascular: RRR, no m/r/g Respiratory: CTAB, NWOB on RA  Abdomen: soft, NTND Extremities:  Dressing appears clean, dry, intact. Mild swelling on lower leg extending from calf to ankle. No increased warmth or erythema noted.  Does endorse pain with calf squeeze   Laboratory: Most recent CBC Lab Results  Component Value Date   WBC 6.1 10/27/2023   HGB 7.7 (L) 10/27/2023   HCT 25.0 (L) 10/27/2023   MCV 89.6 10/27/2023   PLT 291 10/27/2023   Most recent BMP    Latest Ref Rng & Units 10/27/2023    3:06 AM  BMP  Glucose 70 - 99 mg/dL 811   BUN 8 - 23 mg/dL 34   Creatinine 9.14 - 1.00 mg/dL 7.82   Sodium 956 - 213 mmol/L 142   Potassium 3.5 - 5.1 mmol/L 3.9   Chloride 98 - 111 mmol/L 112   CO2 22 - 32 mmol/L 20   Calcium 8.9 - 10.3 mg/dL 9.4     Imaging/Diagnostic Tests: No new images  Penne Lash, MD 11/01/2023, 1:29 PM  PGY-1, [facility] FPTS Intern pager: [phone], text pages welcome Secure chat group [facility] Teaching Service

## 2023-11-01 NOTE — TOC Transition Note (Signed)
Transition of Care [facility]) - Discharge Note   Patient Details  Name: Elizabeth Schwartz MRN: [id number] Date of Birth: 1946-05-08  [facility] CM/SW Contact:  Lorri Frederick, LCSW Phone Number: 11/01/2023, 4:32 PM   Clinical Narrative:   Pt discharging to [facility]  RN call report to [phone].      Final next level of care: Skilled Nursing Facility Barriers to Discharge: Barriers Resolved   Patient Goals and CMS Choice Patient states their goals for this hospitalization and ongoing recovery are:: To get out out the hospital CMS Medicare.gov Compare Post Acute Care list provided to:: Patient Choice offered to / list presented to : Patient      Discharge Placement              Patient chooses bed at:  [facility]) Patient to be transferred to facility by: [facility]      Discharge Plan and Services Additional resources added to the After Visit Summary for   In-house Referral: Clinical Social Work   Post Acute Care Choice: Skilled Nursing Facility                               Social Drivers of Health (SDOH) Interventions SDOH Screenings   Food Insecurity: No Food Insecurity (09/18/2023)  Housing: Low Risk  (10/26/2023)  Transportation Needs: No Transportation Needs (09/18/2023)  Utilities: Not At Risk (09/18/2023)  Alcohol Screen: Low Risk  (03/22/2023)  Depression (PHQ2-9): Low Risk  (07/13/2023)  Financial Resource Strain: Low Risk  (03/22/2023)  Physical Activity: Insufficiently Active (03/22/2023)  Social Connections: Socially Isolated (03/22/2023)  Stress: No Stress Concern Present (03/22/2023)  Tobacco Use: Medium Risk (10/17/2023)     Readmission Risk Interventions     No data to display

## 2023-11-01 NOTE — Progress Notes (Signed)
Physical Therapy Treatment Patient Details Name: Elizabeth Schwartz MRN: [id number] DOB: March 01, 1946 Today's Date: 11/01/2023   History of Present Illness Pt is a 77 y/o F admitted on 09/17/23 after being advised by [facility] nurse to go to the ED after assessing pt's wound. CT showed necrotic myositis, pt also found to have acute osteomyelitis. Pt found to have symptomatic orthostatic hypotension during therapies 11/6-11/8, and consistently following week.  S/P I&D 11/11. 11/20, 12/3.  PMH: anemia, COPD, dementia, depression, DM, HLD, HTN, osteoporosis, schizo-affective, SOB, syncope.    PT Comments  Pt in bed upon arrival and agreeable to PT session. Worked on generalized strengthening, gait training and transfers in today's session. Pt tolerated seated LE exercises well, however, reported pain in L UE w/ shoulder ROM. Pt was able to ambulate ~15 ft to the bathroom w/ MinA for steadying and RW management. Upon return to bed, pt reported feeling lightheaded and started to have knee buckling. Pt required ModA to shift hips towards EOB and to lower. Seated BP 128/66, 85 w/ 107 BPM, RN notified. Pt is progressing towards goals. Acute PT to follow.      If plan is discharge home, recommend the following: A little help with bathing/dressing/bathroom;Assistance with cooking/housework;Supervision due to cognitive status;Direct supervision/assist for financial management;Assist for transportation;Help with stairs or ramp for entrance;Direct supervision/assist for medications management;A lot of help with walking and/or transfers   Can travel by private vehicle     No  Equipment Recommendations  Rolling walker (2 wheels);BSC/3in1;Wheelchair (measurements PT);Wheelchair cushion (measurements PT)    Recommendations for Other Services       Precautions / Restrictions Precautions Precautions: Fall Precaution Comments: Contact; watch BP/HR, LLE foot drop Required Braces or Orthoses: Other Brace Other Brace: L  foot drop brace, TED hose, abdominal binder Restrictions Weight Bearing Restrictions Per Provider Order: No     Mobility  Bed Mobility Overal bed mobility: Needs Assistance Bed Mobility: Supine to Sit Rolling: Min assist   Supine to sit: HOB elevated, Used rails, Contact guard Sit to supine: Contact guard assist, HOB elevated   General bed mobility comments: CGA with increased time and effort. Cues to adhere to task at hand    Transfers Overall transfer level: Needs assistance Equipment used: Rolling walker (2 wheels) Transfers: Sit to/from Stand, Bed to chair/wheelchair/BSC Sit to Stand: Min assist, Mod assist      General transfer comment: ModA for boost up and rise from EOB. MinA from commode.    Ambulation/Gait Ambulation/Gait assistance: Mod assist, Min assist Gait Distance (Feet): 30 Feet (x15, x15) Assistive device: Rolling walker (2 wheels) Gait Pattern/deviations: Step-to pattern, Trunk flexed, Decreased dorsiflexion - left, Decreased weight shift to left, Decreased step length - left, Antalgic, Drifts right/left       General Gait Details: short steps w/ L foot drop brace intact. Upon return from bathroom, pt's R knee started to buckle. Pt reported feeling dizzy and increased gait speed to return to bed. ModA to swing pt's hips toward EOB and for stability as pt lowered to bed. BP readings below         Balance Overall balance assessment: Needs assistance Sitting-balance support: No upper extremity supported, Feet supported Sitting balance-Leahy Scale: Good Sitting balance - Comments: on EOB   Standing balance support: Single extremity supported, Bilateral upper extremity supported, During functional activity Standing balance-Leahy Scale: Poor Standing balance comment: performd reaching tasks while standing with CGA for balance       Cognition Arousal: Alert Behavior During  Therapy: WFL for tasks assessed/performed Overall Cognitive Status: History of  cognitive impairments - at baseline       Exercises General Exercises - Upper Extremity Elbow Flexion: AROM, Both, 10 reps, Seated General Exercises - Lower Extremity Long Arc Quad: AROM, Both, 10 reps, Seated Hip ABduction/ADduction: AROM, Both, 10 reps, Seated Hip Flexion/Marching: AROM, Both, 10 reps, Seated    General Comments General comments (skin integrity, edema, etc.): Pt reported feeling unwell and dizzy when returning to bed from bathroom. Seated BP after ambulation 128/66, 85 HR 107 BPM. BP in supine after rolling for pericare  119/51, 71,HR 93 BPM      Pertinent Vitals/Pain Pain Assessment Pain Assessment: Faces Faces Pain Scale: Hurts even more Pain Location: L UE Pain Descriptors / Indicators: Discomfort, Aching Pain Intervention(s): Limited activity within patient's tolerance, Monitored during session, Repositioned           PT Goals (current goals can now be found in the care plan section) Acute Rehab PT Goals PT Goal Formulation: With patient Time For Goal Achievement: 11/13/23 Potential to Achieve Goals: Fair Progress towards PT goals: Progressing toward goals    Frequency    Min 1X/week       AM-PAC PT "6 Clicks" Mobility   Outcome Measure  Help needed turning from your back to your side while in a flat bed without using bedrails?: A Little Help needed moving from lying on your back to sitting on the side of a flat bed without using bedrails?: A Little Help needed moving to and from a bed to a chair (including a wheelchair)?: A Little Help needed standing up from a chair using your arms (e.g., wheelchair or bedside chair)?: A Little Help needed to walk in hospital room?: A Lot Help needed climbing 3-5 steps with a railing? : Total 6 Click Score: 15    End of Session Equipment Utilized During Treatment: Gait belt Activity Tolerance: Patient tolerated treatment well Patient left: in bed;with call bell/phone within reach;with bed alarm  set Nurse Communication: Mobility status (BP readings and symptoms) PT Visit Diagnosis: Muscle weakness (generalized) (M62.81);Other abnormalities of gait and mobility (R26.89);Difficulty in walking, not elsewhere classified (R26.2)     Time: 1610-9604 PT Time Calculation (min) (ACUTE ONLY): 42 min  Charges:    $Gait Training: 8-22 mins $Therapeutic Exercise: 8-22 mins $Therapeutic Activity: 8-22 mins PT General Charges $$ ACUTE PT VISIT: 1 Visit                     Hilton Cork, PT, DPT Secure Chat Preferred  Rehab Office [phone]   Arturo Morton Brion Aliment 11/01/2023, 11:57 AM

## 2023-11-01 NOTE — Assessment & Plan Note (Signed)
 Disposition was pending APS case worker placement, but was evaluated by Palliative Care and determined to have capacity.  Stated DNR status and consented to transfer to SNF.  Will likely need to spend down given her assets to remain in LTC. -Prior auth pending

## 2023-11-01 NOTE — Assessment & Plan Note (Signed)
 Early acute osteomyelitis found on CT. IV abx treatment plan as above.

## 2023-11-03 DIAGNOSIS — M810 Age-related osteoporosis without current pathological fracture: Secondary | ICD-10-CM | POA: Diagnosis not present

## 2023-11-03 DIAGNOSIS — M86652 Other chronic osteomyelitis, left thigh: Secondary | ICD-10-CM | POA: Diagnosis not present

## 2023-11-03 DIAGNOSIS — M60852 Other myositis, left thigh: Secondary | ICD-10-CM | POA: Diagnosis not present

## 2023-11-03 DIAGNOSIS — E119 Type 2 diabetes mellitus without complications: Secondary | ICD-10-CM | POA: Diagnosis not present

## 2023-11-03 DIAGNOSIS — S7712XA Crushing injury of left thigh, initial encounter: Secondary | ICD-10-CM | POA: Diagnosis not present

## 2023-11-03 DIAGNOSIS — K219 Gastro-esophageal reflux disease without esophagitis: Secondary | ICD-10-CM | POA: Diagnosis not present

## 2023-11-03 DIAGNOSIS — L97529 Non-pressure chronic ulcer of other part of left foot with unspecified severity: Secondary | ICD-10-CM | POA: Diagnosis not present

## 2023-11-03 DIAGNOSIS — D649 Anemia, unspecified: Secondary | ICD-10-CM | POA: Diagnosis not present

## 2023-11-03 DIAGNOSIS — E785 Hyperlipidemia, unspecified: Secondary | ICD-10-CM | POA: Diagnosis not present

## 2023-11-03 DIAGNOSIS — J449 Chronic obstructive pulmonary disease, unspecified: Secondary | ICD-10-CM | POA: Diagnosis not present

## 2023-11-06 ENCOUNTER — Inpatient Hospital Stay: Payer: Medicare Other | Admitting: Internal Medicine

## 2023-11-06 ENCOUNTER — Telehealth: Payer: Self-pay

## 2023-11-06 DIAGNOSIS — M79652 Pain in left thigh: Secondary | ICD-10-CM | POA: Diagnosis not present

## 2023-11-06 DIAGNOSIS — E119 Type 2 diabetes mellitus without complications: Secondary | ICD-10-CM | POA: Diagnosis not present

## 2023-11-06 DIAGNOSIS — M86652 Other chronic osteomyelitis, left thigh: Secondary | ICD-10-CM | POA: Diagnosis not present

## 2023-11-06 DIAGNOSIS — M60852 Other myositis, left thigh: Secondary | ICD-10-CM | POA: Diagnosis not present

## 2023-11-06 DIAGNOSIS — D649 Anemia, unspecified: Secondary | ICD-10-CM | POA: Diagnosis not present

## 2023-11-06 DIAGNOSIS — M6281 Muscle weakness (generalized): Secondary | ICD-10-CM | POA: Diagnosis not present

## 2023-11-06 DIAGNOSIS — J449 Chronic obstructive pulmonary disease, unspecified: Secondary | ICD-10-CM | POA: Diagnosis not present

## 2023-11-06 DIAGNOSIS — K219 Gastro-esophageal reflux disease without esophagitis: Secondary | ICD-10-CM | POA: Diagnosis not present

## 2023-11-06 DIAGNOSIS — R2689 Other abnormalities of gait and mobility: Secondary | ICD-10-CM | POA: Diagnosis not present

## 2023-11-06 DIAGNOSIS — M7989 Other specified soft tissue disorders: Secondary | ICD-10-CM | POA: Diagnosis not present

## 2023-11-06 NOTE — Telephone Encounter (Signed)
Attempted to contact [facility] numerous times to speak with nurse regarding pull PICC. I was unable to speak with a nurse however I faxed over pull PICC orders to [phone].   Per Dr.Comer pull PICC line after last dose IV Zebrexa and IV daptomycin on 11/06/2023. Patient doesn't need a follow up appointment at our office.    [facility] phone number [phone]  Marcell Anger, CMA

## 2023-11-07 LAB — AEROBIC/ANAEROBIC CULTURE W GRAM STAIN (SURGICAL/DEEP WOUND): Gram Stain: NONE SEEN

## 2023-11-07 LAB — MISCELLANEOUS TEST

## 2023-11-10 ENCOUNTER — Telehealth: Payer: Self-pay

## 2023-11-10 DIAGNOSIS — E119 Type 2 diabetes mellitus without complications: Secondary | ICD-10-CM | POA: Diagnosis not present

## 2023-11-10 DIAGNOSIS — D649 Anemia, unspecified: Secondary | ICD-10-CM | POA: Diagnosis not present

## 2023-11-10 DIAGNOSIS — M7989 Other specified soft tissue disorders: Secondary | ICD-10-CM | POA: Diagnosis not present

## 2023-11-10 DIAGNOSIS — M79652 Pain in left thigh: Secondary | ICD-10-CM | POA: Diagnosis not present

## 2023-11-10 DIAGNOSIS — M6281 Muscle weakness (generalized): Secondary | ICD-10-CM | POA: Diagnosis not present

## 2023-11-10 DIAGNOSIS — M60852 Other myositis, left thigh: Secondary | ICD-10-CM | POA: Diagnosis not present

## 2023-11-10 DIAGNOSIS — R2689 Other abnormalities of gait and mobility: Secondary | ICD-10-CM | POA: Diagnosis not present

## 2023-11-10 LAB — ACID FAST CULTURE WITH REFLEXED SENSITIVITIES (MYCOBACTERIA): Acid Fast Culture: NEGATIVE

## 2023-11-10 NOTE — Telephone Encounter (Signed)
Abnormal labs from 11/03/23:  Hgb 9.5 Hct 28.6 ESR 57 (ref range 0-25) CRP 3.65 (ref range < 0.50)  Patient's EOT was 12/23 and is now following up as needed.   Sandie Ano, RN

## 2023-11-13 DIAGNOSIS — M25512 Pain in left shoulder: Secondary | ICD-10-CM | POA: Diagnosis not present

## 2023-11-13 DIAGNOSIS — M79652 Pain in left thigh: Secondary | ICD-10-CM | POA: Diagnosis not present

## 2023-11-13 DIAGNOSIS — W19XXXA Unspecified fall, initial encounter: Secondary | ICD-10-CM | POA: Diagnosis not present

## 2023-11-13 DIAGNOSIS — K219 Gastro-esophageal reflux disease without esophagitis: Secondary | ICD-10-CM | POA: Diagnosis not present

## 2023-11-13 DIAGNOSIS — M6281 Muscle weakness (generalized): Secondary | ICD-10-CM | POA: Diagnosis not present

## 2023-11-13 DIAGNOSIS — E119 Type 2 diabetes mellitus without complications: Secondary | ICD-10-CM | POA: Diagnosis not present

## 2023-11-13 DIAGNOSIS — M60852 Other myositis, left thigh: Secondary | ICD-10-CM | POA: Diagnosis not present

## 2023-11-13 DIAGNOSIS — J449 Chronic obstructive pulmonary disease, unspecified: Secondary | ICD-10-CM | POA: Diagnosis not present

## 2023-11-13 DIAGNOSIS — M7989 Other specified soft tissue disorders: Secondary | ICD-10-CM | POA: Diagnosis not present

## 2023-11-13 DIAGNOSIS — M86652 Other chronic osteomyelitis, left thigh: Secondary | ICD-10-CM | POA: Diagnosis not present

## 2023-11-13 DIAGNOSIS — R2689 Other abnormalities of gait and mobility: Secondary | ICD-10-CM | POA: Diagnosis not present

## 2023-11-13 DIAGNOSIS — D649 Anemia, unspecified: Secondary | ICD-10-CM | POA: Diagnosis not present

## 2023-11-16 ENCOUNTER — Ambulatory Visit: Payer: Medicare Other | Admitting: Family Medicine

## 2023-11-21 LAB — MISC LABCORP TEST (SEND OUT): Labcorp test code: 9985

## 2023-11-22 ENCOUNTER — Telehealth: Payer: Self-pay

## 2023-11-22 NOTE — Telephone Encounter (Signed)
 Received call from SNF stating patient Sed Rate was 50 on 1/2. Staff would like to know if they should recollect lab.  Spoke with Alan, Pharmacist may take a while to fully resolve - wouldn't worry about recollection but of course will defer to Dr. Efrain. Will call back with provider's decision.  Lorenda CHRISTELLA Code, RMA

## 2023-11-23 NOTE — Telephone Encounter (Signed)
 Per Dr. Luciana Axe no need to recollect results at this time.  Juanita Laster, RMA

## 2023-11-24 LAB — LAB REPORT - SCANNED: EGFR: 48

## 2023-11-29 ENCOUNTER — Telehealth: Payer: Self-pay

## 2023-11-29 NOTE — Telephone Encounter (Signed)
 Received call from [facility] regarding abnormal labs.   RBC 3.40 HGB 9.8 HCT 29.2 ESR - 47  RN said the patient will be having an x-ray on left thigh wound and they will send over results.   Jacub Waiters Roann Chestnut, CMA

## 2023-12-04 ENCOUNTER — Encounter: Payer: Self-pay | Admitting: Internal Medicine

## 2023-12-04 ENCOUNTER — Other Ambulatory Visit: Payer: Self-pay

## 2023-12-04 ENCOUNTER — Ambulatory Visit (INDEPENDENT_AMBULATORY_CARE_PROVIDER_SITE_OTHER): Payer: Medicare Other | Admitting: Internal Medicine

## 2023-12-04 VITALS — BP 126/78 | HR 107 | Temp 98.2°F

## 2023-12-04 DIAGNOSIS — M869 Osteomyelitis, unspecified: Secondary | ICD-10-CM

## 2023-12-04 DIAGNOSIS — M6080 Other myositis, unspecified site: Secondary | ICD-10-CM

## 2023-12-04 NOTE — Progress Notes (Signed)
[facility]  Patient Active Problem List   Diagnosis Date Noted   Anemia 10/01/2023   Type 2 diabetes mellitus without complication (HCC) 09/30/2023   Skin ulcer of left great toe (HCC) 09/28/2023   Left leg swelling 09/28/2023   Goals of care, counseling/discussion 09/25/2023   Necrotizing myositis 09/23/2023   Diabetes mellitus without complication (HCC) 09/23/2023   Dementia (HCC) 09/23/2023   Long term current use of clozapine 09/18/2023   Myositis of left thigh 09/18/2023   Osteomyelitis of left femur (HCC) 09/18/2023   Pressure injury of left thigh, stage 4 (HCC) 09/18/2023   Pressure injury of deep tissue of left thigh 09/17/2023   Rhabdomyolysis 08/10/2023   Transaminitis 08/10/2023   CAP (community acquired pneumonia) 08/10/2023   Lactic acidosis 08/10/2023   Elevated troponin 08/10/2023   Pressure injury of skin with suspected deep tissue injury 08/10/2023   Porokeratosis 01/02/2023   Dementia due to Alzheimer's disease (HCC) 08/11/2022   Statin myopathy 04/21/2022   Pain due to onychomycosis of toenails of both feet 02/22/2022   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) 11/22/2021   Chronic pain of right hip 09/15/2021   Chronic pain of left knee 09/15/2021   Chronic left shoulder pain 05/24/2021   Mixed hyperlipidemia 03/22/2021   Pincer nail deformity 11/03/2020   Chronic diarrhea 03/20/2019   Hypertension 08/22/2016   Normocytic anemia 08/22/2016   Major neurocognitive disorder due to multiple etiologies with behavioral disturbance (HCC) 08/22/2016   Osteoporosis 08/22/2016   Asthma, moderate persistent 09/08/2011   Diverticulosis 09/08/2011   Former smoker 09/08/2011   GERD 06/04/2007      Subjective:    Patient ID: Elizabeth Schwartz, female    DOB: 1946/01/02, 78 y.o.   MRN: [id number]  Chief Complaint  Patient presents with   Follow-up    HPI:  Elizabeth Schwartz is a 78 y.o. female with dementia/dm2  here for f/u hospital admission nec fasc/left femur om  Finished iv abx by 11/06/23 Mdro psa, mrsa, e faecalis, bacteroides Dapto/zerbax     Allergies  Allergen Reactions   Crestor [Rosuvastatin] Other (See Comments)    Muscle aches.    Fish Allergy Other (See Comments)      Outpatient Medications Prior to Visit  Medication Sig Dispense Refill   acetaminophen (TYLENOL) 325 MG tablet Take 2 tablets (650 mg total) by mouth every 6 (six) hours as needed for mild pain (or Fever >/= 101).     albuterol (VENTOLIN HFA) 108 (90 Base) MCG/ACT inhaler INHALE TWO PUFFS BY MOUTH EVERY 6 HOURS AS NEEDED FOR SHORTNESS OF BREATH 18 g 3   Blood Glucose Monitoring Suppl (ONETOUCH VERIO) w/Device KIT Use to check blood sugar 1 kit 0   Calcium Carb-Cholecalciferol (CALCIUM 600/VITAMIN D PO) Take 1 tablet by mouth daily. 600-200 mg Calcium-Vitamin D     clozapine (CLOZARIL) 200 MG tablet Take 1 tablet (200 mg total) by mouth at bedtime. 30 tablet 0   collagenase (SANTYL) 250 UNIT/GM ointment Apply 1 Application topically daily. Apply to left hip topically in the afternoon for wound cleanse. Apply to left lateral knee topically in the afternoon for wound cleanse.     donepezil (ARICEPT) 10 MG tablet Take 1 tablet (10 mg total) by mouth at bedtime. 90 tablet 3   ezetimibe (ZETIA) 10 MG tablet Take 1 tablet (10 mg total) by mouth daily. 90 tablet 3   famotidine (PEPCID) 20 MG tablet  Take 1 tablet (20 mg total) by mouth 2 (two) times daily. 180 tablet 0   fenofibrate 160 MG tablet Take 1 tablet (160 mg total) by mouth daily. 60 tablet 6   fluticasone-salmeterol (ADVAIR) 250-50 MCG/ACT AEPB Inhale 1 puff into the lungs in the morning and at bedtime.     Iron, Ferrous Sulfate, 325 (65 Fe) MG TABS Take 325 mg by mouth daily with breakfast. 60 tablet 4   memantine (NAMENDA) 5 MG tablet Take 1 tablet (5 mg total) by mouth 2 (two) times daily.     OneTouch Delica Lancets 33G MISC Use to check blood sugar daily  100 each 1   ONETOUCH VERIO test strip Use as instructed 100 each 2   vitamin C (ASCORBIC ACID) 500 MG tablet Take 500 mg by mouth every morning.     alendronate (FOSAMAX) 70 MG tablet Take 1 tablet (70 mg total) by mouth once a week. (Patient not taking: Reported on 12/04/2023) 12 tablet 3   oxyCODONE (OXY IR/ROXICODONE) 5 MG immediate release tablet Take 1 tablet (5 mg total) by mouth every 6 (six) hours as needed for moderate pain (pain score 4-6). (Patient not taking: Reported on 12/04/2023) 10 tablet 0   SYMBICORT 160-4.5 MCG/ACT inhaler INHALE TWO PUFFS INTO THE LUNGS IN THE MORNING AND AT BEDTIME (Patient not taking: Reported on 12/04/2023) 10.2 g 2   No facility-administered medications prior to visit.     Social History   Socioeconomic History   Marital status: Single    Spouse name: Not on file   Number of children: 0   Years of education: 12   Highest education level: Not on file  Occupational History   Not on file  Tobacco Use   Smoking status: Former    Current packs/day: 0.00    Types: Cigarettes    Start date: 02/27/1997    Quit date: 02/28/2012    Years since quitting: 11.7   Smokeless tobacco: Former    Types: Associate Professor status: Never Used  Substance and Sexual Activity   Alcohol use: Not Currently    Comment: "socially"   Drug use: No   Sexual activity: Not Currently  Other Topics Concern   Not on file  Social History Narrative   Right handed   Drinks caffeine   Retired   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)   Social Drivers of Health   Financial Resource Strain: Low Risk  (03/22/2023)   Overall Financial Resource Strain (CARDIA)    Difficulty of Paying Living Expenses: Not hard at all  Food Insecurity: No Food Insecurity (09/18/2023)   Hunger Vital Sign    Worried About Running Out of Food in the Last Year: Never true    Ran Out of Food in the Last Year: Never true  Transportation Needs: No Transportation  Needs (09/18/2023)   PRAPARE - Administrator, Civil Service (Medical): No    Lack of Transportation (Non-Medical): No  Physical Activity: Insufficiently Active (03/22/2023)   Exercise Vital Sign    Days of Exercise per Week: 3 days    Minutes of Exercise per Session: 30 min  Stress: No Stress Concern Present (03/22/2023)   [employer] of Occupational Health - Occupational Stress Questionnaire    Feeling of Stress : Not at all  Social Connections: Socially Isolated (03/22/2023)   Social Connection and Isolation Panel [NHANES]    Frequency of Communication with Friends and Family: Three  times a week    Frequency of Social Gatherings with Friends and Family: Never    Attends Religious Services: Never    Database administrator or Organizations: No    Attends Banker Meetings: Never    Marital Status: Never married  Intimate Partner Violence: Not At Risk (09/18/2023)   Humiliation, Afraid, Rape, and Kick questionnaire    Fear of Current or Ex-Partner: No    Emotionally Abused: No    Physically Abused: No    Sexually Abused: No      Review of Systems    All other ros negative  Objective:    BP 126/78   Pulse (!) 107   Temp 98.2 F (36.8 C) (Temporal)   SpO2 95%  Nursing note and vital signs reviewed.  Physical Exam     General/constitutional: no distress, pleasant; slight nasal congestion sounding HEENT: Normocephalic, PER, Conj Clear, EOMI, Oropharynx clear Neck supple CV: rrr no mrg Lungs: clear to auscultation, normal respiratory effort Abd: Soft, Nontender Ext: no edema Skin/msk: healthy wound bed. No undermining - stage 3  12/04/23 picture left thigh wound   Labs: Lab Results  Component Value Date   WBC 6.1 10/27/2023   HGB 7.7 (L) 10/27/2023   HCT 25.0 (L) 10/27/2023   MCV 89.6 10/27/2023   PLT 291 10/27/2023   Last metabolic panel Lab Results  Component Value Date   GLUCOSE 113 (H) 10/27/2023   NA 142 10/27/2023   K 3.9  10/27/2023   CL 112 (H) 10/27/2023   CO2 20 (L) 10/27/2023   BUN 34 (H) 10/27/2023   CREATININE 0.92 10/27/2023   GFRNONAA >60 10/27/2023   CALCIUM 9.4 10/27/2023   PROT 5.4 (L) 09/20/2023   ALBUMIN 2.5 (L) 09/20/2023   LABGLOB 2.1 07/13/2023   AGRATIO 2.3 (H) 11/17/2022   BILITOT 0.4 09/20/2023   ALKPHOS 39 09/20/2023   AST 17 09/20/2023   ALT 15 09/20/2023   ANIONGAP 10 10/27/2023      Component Value Date/Time   CRP 0.6 (H) 02/10/2010 2036    Micro:  Serology:  Imaging: Reviewed  09/17/23 ct left femur 1. Large soft tissue defect at the medial aspect of the mid to distal left thigh with absence of the subcutaneous fat. The underlying anterior compartment musculature is exposed at the site of ulceration. There is air tracking to the underlying femur. Markedly abnormal appearance of the vastus medialis muscle of the mid to distal left thigh underlying site of ulceration with heterogeneously low attenuation appearance of the muscle with numerous foci of intramuscular air/gas. Findings are concerning for necrotizing myositis. 2. Near-circumferential proliferative periosteal elevation surrounding the mid left femoral diaphysis at the site of deep soft tissue wound/ulceration. Subtle irregularity of the cortex without well-defined erosion or bony destruction. Findings are highly suggestive of early acute osteomyelitis. 3. Small left knee joint effusion is nonspecific.  Assessment & Plan:   Problem List Items Addressed This Visit     Necrotizing myositis   Relevant Orders   CBC w/Diff   C-reactive protein   Sedimentation rate   Other Visit Diagnoses       Osteomyelitis, unspecified site, unspecified type (HCC)    -  Primary   Relevant Orders   CBC w/Diff   C-reactive protein   Sedimentation rate         No orders of the defined types were placed in this encounter.    78 year old female with history of dementia and dm, here for  hospital f/u of left  thigh nec fasc with femur osteomyelitis Last I&D 12/3; initial I&D 09/25/23 Previous cx Mdro pseudomonas, mrsa, e faecalis, and bacteroides   12/04/23 id assessment She came from snf Previous wound cx grew mdr pseudomonas requiring zerbax. Also on daptomycin which she finished on 11/06/23   Today the wound is still big but has great granulating healthy tissue She has sx of uri today for 2 days   Labs today  Will revisit in around 4-6 weeks to make sure she continues to improve and if so can discharge from clinic     Follow-up: Return in about 4 weeks (around 01/01/2024).      Raymondo Band, MD [facility] 12/04/2023, 9:47 AM

## 2023-12-04 NOTE — Patient Instructions (Signed)
You have finished iv antibiotics 11/06/23   Wound looks good   See me in around 4 weeks for one final check up   Labs today

## 2023-12-05 LAB — CBC WITH DIFFERENTIAL/PLATELET
Absolute Lymphocytes: 684 {cells}/uL — ABNORMAL LOW (ref 850–3900)
Absolute Monocytes: 567 {cells}/uL (ref 200–950)
Basophils Absolute: 27 {cells}/uL (ref 0–200)
Basophils Relative: 0.3 %
Eosinophils Absolute: 0 {cells}/uL — ABNORMAL LOW (ref 15–500)
Eosinophils Relative: 0 %
HCT: 30.8 % — ABNORMAL LOW (ref 35.0–45.0)
Hemoglobin: 9.7 g/dL — ABNORMAL LOW (ref 11.7–15.5)
MCH: 27.9 pg (ref 27.0–33.0)
MCHC: 31.5 g/dL — ABNORMAL LOW (ref 32.0–36.0)
MCV: 88.5 fL (ref 80.0–100.0)
MPV: 11.3 fL (ref 7.5–12.5)
Monocytes Relative: 6.3 %
Neutro Abs: 7722 {cells}/uL (ref 1500–7800)
Neutrophils Relative %: 85.8 %
Platelets: 306 10*3/uL (ref 140–400)
RBC: 3.48 10*6/uL — ABNORMAL LOW (ref 3.80–5.10)
RDW: 16.8 % — ABNORMAL HIGH (ref 11.0–15.0)
Total Lymphocyte: 7.6 %
WBC: 9 10*3/uL (ref 3.8–10.8)

## 2023-12-05 LAB — SEDIMENTATION RATE: Sed Rate: 84 mm/h — ABNORMAL HIGH (ref 0–30)

## 2023-12-05 LAB — C-REACTIVE PROTEIN: CRP: 122 mg/L — ABNORMAL HIGH (ref <8.0)

## 2023-12-22 ENCOUNTER — Ambulatory Visit: Payer: Self-pay | Admitting: Physician Assistant

## 2024-01-01 ENCOUNTER — Other Ambulatory Visit: Payer: Self-pay

## 2024-01-01 ENCOUNTER — Encounter: Payer: Self-pay | Admitting: Internal Medicine

## 2024-01-01 ENCOUNTER — Ambulatory Visit (INDEPENDENT_AMBULATORY_CARE_PROVIDER_SITE_OTHER): Payer: Medicare Other | Admitting: Internal Medicine

## 2024-01-01 VITALS — BP 119/74 | HR 97 | Temp 97.8°F

## 2024-01-01 DIAGNOSIS — M869 Osteomyelitis, unspecified: Secondary | ICD-10-CM

## 2024-01-01 DIAGNOSIS — B952 Enterococcus as the cause of diseases classified elsewhere: Secondary | ICD-10-CM

## 2024-01-01 DIAGNOSIS — B9562 Methicillin resistant Staphylococcus aureus infection as the cause of diseases classified elsewhere: Secondary | ICD-10-CM | POA: Diagnosis not present

## 2024-01-01 DIAGNOSIS — S71102D Unspecified open wound, left thigh, subsequent encounter: Secondary | ICD-10-CM

## 2024-01-01 DIAGNOSIS — M86652 Other chronic osteomyelitis, left thigh: Secondary | ICD-10-CM

## 2024-01-01 DIAGNOSIS — B965 Pseudomonas (aeruginosa) (mallei) (pseudomallei) as the cause of diseases classified elsewhere: Secondary | ICD-10-CM

## 2024-01-01 DIAGNOSIS — T148XXA Other injury of unspecified body region, initial encounter: Secondary | ICD-10-CM

## 2024-01-01 NOTE — Progress Notes (Signed)
[facility]  Patient Active Problem List   Diagnosis Date Noted   Anemia 10/01/2023   Type 2 diabetes mellitus without complication (HCC) 09/30/2023   Skin ulcer of left great toe (HCC) 09/28/2023   Left leg swelling 09/28/2023   Goals of care, counseling/discussion 09/25/2023   Necrotizing myositis 09/23/2023   Diabetes mellitus without complication (HCC) 09/23/2023   Dementia (HCC) 09/23/2023   Long term current use of clozapine 09/18/2023   Myositis of left thigh 09/18/2023   Osteomyelitis of left femur (HCC) 09/18/2023   Pressure injury of left thigh, stage 4 (HCC) 09/18/2023   Pressure injury of deep tissue of left thigh 09/17/2023   Rhabdomyolysis 08/10/2023   Transaminitis 08/10/2023   CAP (community acquired pneumonia) 08/10/2023   Lactic acidosis 08/10/2023   Elevated troponin 08/10/2023   Pressure injury of skin with suspected deep tissue injury 08/10/2023   Porokeratosis 01/02/2023   Dementia due to Alzheimer's disease (HCC) 08/11/2022   Statin myopathy 04/21/2022   Pain due to onychomycosis of toenails of both feet 02/22/2022   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) 11/22/2021   Chronic pain of right hip 09/15/2021   Chronic pain of left knee 09/15/2021   Chronic left shoulder pain 05/24/2021   Mixed hyperlipidemia 03/22/2021   Pincer nail deformity 11/03/2020   Chronic diarrhea 03/20/2019   Hypertension 08/22/2016   Normocytic anemia 08/22/2016   Major neurocognitive disorder due to multiple etiologies with behavioral disturbance (HCC) 08/22/2016   Osteoporosis 08/22/2016   Asthma, moderate persistent 09/08/2011   Diverticulosis 09/08/2011   Former smoker 09/08/2011   GERD 06/04/2007      Subjective:    Patient ID: Elizabeth Schwartz, female    DOB: 06-27-1946, 78 y.o.   MRN: [id number]  Chief Complaint  Patient presents with   Follow-up    HPI:  Elizabeth Schwartz is a 78 y.o. female with dementia/dm2  here for f/u hospital admission nec fasc/left femur om  Finished iv abx by 11/06/23 Mdro psa, mrsa, e faecalis, bacteroides Dapto/zerbax   01/01/24 id clinic f/u Patient comes for clinic f/u, from her snf She had remained off abx since 11/06/23 Reviewed recent labs Lab Results  Component Value Date   CRP 122.0 (H) 12/04/2023   Lab Results  Component Value Date   ESRSEDRATE 84 (H) 12/04/2023   Lab Results  Component Value Date   WBC 9.0 12/04/2023   HGB 9.7 (L) 12/04/2023   HCT 30.8 (L) 12/04/2023   MCV 88.5 12/04/2023   PLT 306 12/04/2023    She gets wound care packing at snf but no formal wound clinic visit She reports packing with dressing and some kind of cream  No fever, chill, decreased appetite No focal pain     Allergies  Allergen Reactions   Crestor [Rosuvastatin] Other (See Comments)    Muscle aches.    Fish Allergy Other (See Comments)      Outpatient Medications Prior to Visit  Medication Sig Dispense Refill   acetaminophen (TYLENOL) 325 MG tablet Take 2 tablets (650 mg total) by mouth every 6 (six) hours as needed for mild pain (or Fever >/= 101).     albuterol (VENTOLIN HFA) 108 (90 Base) MCG/ACT inhaler INHALE TWO PUFFS BY MOUTH EVERY 6 HOURS AS NEEDED FOR SHORTNESS OF BREATH 18 g 3   Blood Glucose Monitoring Suppl (ONETOUCH VERIO) w/Device KIT Use to check blood sugar 1 kit 0   Calcium  Carb-Cholecalciferol (CALCIUM 600/VITAMIN D PO) Take 1 tablet by mouth daily. 600-200 mg Calcium-Vitamin D     clozapine (CLOZARIL) 200 MG tablet Take 1 tablet (200 mg total) by mouth at bedtime. 30 tablet 0   donepezil (ARICEPT) 10 MG tablet Take 1 tablet (10 mg total) by mouth at bedtime. 90 tablet 3   ezetimibe (ZETIA) 10 MG tablet Take 1 tablet (10 mg total) by mouth daily. 90 tablet 3   famotidine (PEPCID) 20 MG tablet Take 1 tablet (20 mg total) by mouth 2 (two) times daily. 180 tablet 0   fenofibrate 160 MG tablet Take 1 tablet (160 mg total) by mouth daily.  60 tablet 6   fluticasone-salmeterol (ADVAIR) 250-50 MCG/ACT AEPB Inhale 1 puff into the lungs in the morning and at bedtime.     Iron, Ferrous Sulfate, 325 (65 Fe) MG TABS Take 325 mg by mouth daily with breakfast. 60 tablet 4   memantine (NAMENDA) 5 MG tablet Take 1 tablet (5 mg total) by mouth 2 (two) times daily.     OneTouch Delica Lancets 33G MISC Use to check blood sugar daily 100 each 1   ONETOUCH VERIO test strip Use as instructed 100 each 2   vitamin C (ASCORBIC ACID) 500 MG tablet Take 500 mg by mouth every morning.     alendronate (FOSAMAX) 70 MG tablet Take 1 tablet (70 mg total) by mouth once a week. (Patient not taking: Reported on 12/04/2023) 12 tablet 3   collagenase (SANTYL) 250 UNIT/GM ointment Apply 1 Application topically daily. Apply to left hip topically in the afternoon for wound cleanse. Apply to left lateral knee topically in the afternoon for wound cleanse. (Patient not taking: Reported on 01/01/2024)     oxyCODONE (OXY IR/ROXICODONE) 5 MG immediate release tablet Take 1 tablet (5 mg total) by mouth every 6 (six) hours as needed for moderate pain (pain score 4-6). (Patient not taking: Reported on 01/01/2024) 10 tablet 0   SYMBICORT 160-4.5 MCG/ACT inhaler INHALE TWO PUFFS INTO THE LUNGS IN THE MORNING AND AT BEDTIME (Patient not taking: Reported on 12/04/2023) 10.2 g 2   No facility-administered medications prior to visit.     Social History   Socioeconomic History   Marital status: Single    Spouse name: Not on file   Number of children: 0   Years of education: 12   Highest education level: Not on file  Occupational History   Not on file  Tobacco Use   Smoking status: Former    Current packs/day: 0.00    Types: Cigarettes    Start date: 02/27/1997    Quit date: 02/28/2012    Years since quitting: 11.8   Smokeless tobacco: Former    Types: Associate Professor status: Never Used  Substance and Sexual Activity   Alcohol use: Not Currently    Comment:  "socially"   Drug use: No   Sexual activity: Not Currently  Other Topics Concern   Not on file  Social History Narrative   Right handed   Drinks caffeine   Retired   Currently lives alone in senior apartments ([employer])   Social Drivers of Health   Financial Resource Strain: Low Risk  (03/22/2023)   Overall Financial Resource Strain (CARDIA)    Difficulty of Paying Living Expenses: Not hard at all  Food Insecurity: No Food Insecurity (09/18/2023)   Hunger Vital Sign    Worried About Running Out of Food in the Last Year: Never  true    Ran Out of Food in the Last Year: Never true  Transportation Needs: No Transportation Needs (09/18/2023)   PRAPARE - Administrator, Civil Service (Medical): No    Lack of Transportation (Non-Medical): No  Physical Activity: Insufficiently Active (03/22/2023)   Exercise Vital Sign    Days of Exercise per Week: 3 days    Minutes of Exercise per Session: 30 min  Stress: No Stress Concern Present (03/22/2023)   [employer] of Occupational Health - Occupational Stress Questionnaire    Feeling of Stress : Not at all  Social Connections: Socially Isolated (03/22/2023)   Social Connection and Isolation Panel [NHANES]    Frequency of Communication with Friends and Family: Three times a week    Frequency of Social Gatherings with Friends and Family: Never    Attends Religious Services: Never    Database administrator or Organizations: No    Attends Banker Meetings: Never    Marital Status: Never married  Intimate Partner Violence: Not At Risk (09/18/2023)   Humiliation, Afraid, Rape, and Kick questionnaire    Fear of Current or Ex-Partner: No    Emotionally Abused: No    Physically Abused: No    Sexually Abused: No      Review of Systems    All other ros negative  Objective:    BP 119/74   Pulse 97   Temp 97.8 F (36.6 C) (Temporal)   SpO2 98%  Nursing note and vital signs reviewed.  Physical Exam      General/constitutional: no distress, pleasant; slight nasal congestion sounding HEENT: Normocephalic, PER, Conj Clear, EOMI, Oropharynx clear Neck supple CV: rrr no mrg Lungs: clear to auscultation, normal respiratory effort Abd: Soft, Nontender Ext: no edema Skin/msk: healthy wound bed. No undermining - stage 3  12/04/23 picture left thigh wound   01/01/24 picture No surrounding cellulitis or exposed bone; but greenish exudates visualized and tissue doesn't appear as healthy as previous visit  No focal tenderness    Labs: Lab Results  Component Value Date   WBC 9.0 12/04/2023   HGB 9.7 (L) 12/04/2023   HCT 30.8 (L) 12/04/2023   MCV 88.5 12/04/2023   PLT 306 12/04/2023   Last metabolic panel Lab Results  Component Value Date   GLUCOSE 113 (H) 10/27/2023   NA 142 10/27/2023   K 3.9 10/27/2023   CL 112 (H) 10/27/2023   CO2 20 (L) 10/27/2023   BUN 34 (H) 10/27/2023   CREATININE 0.92 10/27/2023   GFRNONAA >60 10/27/2023   CALCIUM 9.4 10/27/2023   PROT 5.4 (L) 09/20/2023   ALBUMIN 2.5 (L) 09/20/2023   LABGLOB 2.1 07/13/2023   AGRATIO 2.3 (H) 11/17/2022   BILITOT 0.4 09/20/2023   ALKPHOS 39 09/20/2023   AST 17 09/20/2023   ALT 15 09/20/2023   ANIONGAP 10 10/27/2023      Component Value Date/Time   CRP 122.0 (H) 12/04/2023 1012   CRP 0.6 (H) 02/10/2010 2036    Micro:  Serology:  Imaging: Reviewed  09/17/23 ct left femur 1. Large soft tissue defect at the medial aspect of the mid to distal left thigh with absence of the subcutaneous fat. The underlying anterior compartment musculature is exposed at the site of ulceration. There is air tracking to the underlying femur. Markedly abnormal appearance of the vastus medialis muscle of the mid to distal left thigh underlying site of ulceration with heterogeneously low attenuation appearance of the muscle with numerous foci  of intramuscular air/gas. Findings are concerning for necrotizing myositis. 2.  Near-circumferential proliferative periosteal elevation surrounding the mid left femoral diaphysis at the site of deep soft tissue wound/ulceration. Subtle irregularity of the cortex without well-defined erosion or bony destruction. Findings are highly suggestive of early acute osteomyelitis. 3. Small left knee joint effusion is nonspecific.  Assessment & Plan:   Problem List Items Addressed This Visit   None Visit Diagnoses       Osteomyelitis, unspecified site, unspecified type (HCC)    -  Primary   Relevant Orders   Ambulatory referral to Wound Clinic     Open wound       Relevant Orders   Ambulatory referral to Wound Clinic   C-reactive protein   Sedimentation rate   CBC   COMPLETE METABOLIC PANEL WITH GFR          No orders of the defined types were placed in this encounter.    78 year old female with history of dementia and dm, here for hospital f/u of left thigh nec fasc with femur osteomyelitis Last I&D 12/3; initial I&D 09/25/23 Previous cx Mdro pseudomonas, mrsa, e faecalis, and bacteroides   12/04/23 id assessment She came from snf Previous wound cx grew mdr pseudomonas requiring zerbax. Also on daptomycin which she finished on 11/06/23   Today the wound is still big but has great granulating healthy tissue She has sx of uri today for 2 days   Labs today  Will revisit in around 4-6 weeks to make sure she continues to improve and if so can discharge from clinic   01/01/24 id assessment Chronic om s/p 6 week course; md pseudomonas and mrsa and e faecalis Elevated crp last visit Wound less healthy appearing today visit and has greenish exudate Will repeat inflammatory markers Refer to wound clinic as might need enzymatic debridement   If rising crp might need to get deep culture/more debridement -- will review need for repeat imaging   F/u 3-4 weeks Facility number [phone]   Follow-up: Return in about 3 weeks (around  01/22/2024).      Raymondo Band, MD [facility] 01/01/2024, 9:42 AM

## 2024-01-01 NOTE — Patient Instructions (Signed)
Wound clinic referral  F/u 3-4 weeks   Continue packing at nursing home

## 2024-01-02 LAB — COMPLETE METABOLIC PANEL WITH GFR
AG Ratio: 1.4 (calc) (ref 1.0–2.5)
ALT: 76 U/L — ABNORMAL HIGH (ref 6–29)
AST: 89 U/L — ABNORMAL HIGH (ref 10–35)
Albumin: 4 g/dL (ref 3.6–5.1)
Alkaline phosphatase (APISO): 75 U/L (ref 37–153)
BUN: 14 mg/dL (ref 7–25)
CO2: 26 mmol/L (ref 20–32)
Calcium: 9.6 mg/dL (ref 8.6–10.4)
Chloride: 104 mmol/L (ref 98–110)
Creat: 0.91 mg/dL (ref 0.60–1.00)
Globulin: 2.9 g/dL (ref 1.9–3.7)
Glucose, Bld: 173 mg/dL — ABNORMAL HIGH (ref 65–99)
Potassium: 3.8 mmol/L (ref 3.5–5.3)
Sodium: 141 mmol/L (ref 135–146)
Total Bilirubin: 0.3 mg/dL (ref 0.2–1.2)
Total Protein: 6.9 g/dL (ref 6.1–8.1)
eGFR: 65 mL/min/{1.73_m2} (ref >=60)

## 2024-01-02 LAB — CBC
HCT: 35.2 % (ref 35.0–45.0)
Hemoglobin: 10.8 g/dL — ABNORMAL LOW (ref 11.7–15.5)
MCH: 27.6 pg (ref 27.0–33.0)
MCHC: 30.7 g/dL — ABNORMAL LOW (ref 32.0–36.0)
MCV: 90 fL (ref 80.0–100.0)
MPV: 10.2 fL (ref 7.5–12.5)
Platelets: 408 10*3/uL — ABNORMAL HIGH (ref 140–400)
RBC: 3.91 10*6/uL (ref 3.80–5.10)
RDW: 14.8 % (ref 11.0–15.0)
WBC: 5.5 10*3/uL (ref 3.8–10.8)

## 2024-01-02 LAB — SEDIMENTATION RATE: Sed Rate: 80 mm/h — ABNORMAL HIGH (ref 0–30)

## 2024-01-02 LAB — C-REACTIVE PROTEIN: CRP: 68 mg/L — ABNORMAL HIGH (ref <8.0)

## 2024-01-02 NOTE — Progress Notes (Signed)
Crp up but improving Wound open still and could attribute to this elevation  Wound care referral placed during visit   Patient has f/u in 4 weeks   Fyi in case patient calls to inquire

## 2024-01-30 ENCOUNTER — Encounter: Payer: Self-pay | Admitting: Internal Medicine

## 2024-01-30 ENCOUNTER — Ambulatory Visit (INDEPENDENT_AMBULATORY_CARE_PROVIDER_SITE_OTHER): Payer: Medicare Other | Admitting: Internal Medicine

## 2024-01-30 ENCOUNTER — Other Ambulatory Visit: Payer: Self-pay

## 2024-01-30 VITALS — BP 125/79 | HR 98 | Resp 16 | Ht 65.0 in | Wt 152.0 lb

## 2024-01-30 DIAGNOSIS — T148XXA Other injury of unspecified body region, initial encounter: Secondary | ICD-10-CM

## 2024-01-30 DIAGNOSIS — M869 Osteomyelitis, unspecified: Secondary | ICD-10-CM

## 2024-01-30 DIAGNOSIS — M86152 Other acute osteomyelitis, left femur: Secondary | ICD-10-CM

## 2024-01-30 DIAGNOSIS — T148XXD Other injury of unspecified body region, subsequent encounter: Secondary | ICD-10-CM

## 2024-01-30 NOTE — Progress Notes (Signed)
 [facility]  Patient Active Problem List   Diagnosis Date Noted   Anemia 10/01/2023   Type 2 diabetes mellitus without complication (HCC) 09/30/2023   Skin ulcer of left great toe (HCC) 09/28/2023   Left leg swelling 09/28/2023   Goals of care, counseling/discussion 09/25/2023   Necrotizing myositis 09/23/2023   Diabetes mellitus without complication (HCC) 09/23/2023   Dementia (HCC) 09/23/2023   Long term current use of clozapine 09/18/2023   Myositis of left thigh 09/18/2023   Osteomyelitis of left femur (HCC) 09/18/2023   Pressure injury of left thigh, stage 4 (HCC) 09/18/2023   Pressure injury of deep tissue of left thigh 09/17/2023   Rhabdomyolysis 08/10/2023   Transaminitis 08/10/2023   CAP (community acquired pneumonia) 08/10/2023   Lactic acidosis 08/10/2023   Elevated troponin 08/10/2023   Pressure injury of skin with suspected deep tissue injury 08/10/2023   Porokeratosis 01/02/2023   Dementia due to Alzheimer's disease (HCC) 08/11/2022   Statin myopathy 04/21/2022   Pain due to onychomycosis of toenails of both feet 02/22/2022   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) 11/22/2021   Chronic pain of right hip 09/15/2021   Chronic pain of left knee 09/15/2021   Chronic left shoulder pain 05/24/2021   Mixed hyperlipidemia 03/22/2021   Pincer nail deformity 11/03/2020   Chronic diarrhea 03/20/2019   Hypertension 08/22/2016   Normocytic anemia 08/22/2016   Major neurocognitive disorder due to multiple etiologies with behavioral disturbance (HCC) 08/22/2016   Osteoporosis 08/22/2016   Asthma, moderate persistent 09/08/2011   Diverticulosis 09/08/2011   Former smoker 09/08/2011   GERD 06/04/2007      Subjective:    Patient ID: Elizabeth Schwartz, female    DOB: [birth year], 78 y.o.   MRN: [id number]  Chief Complaint  Patient presents with   Follow-up    HPI:  Elizabeth Schwartz is a 78 y.o. female with dementia/dm2  here for f/u hospital admission nec fasc/left femur om  Finished iv abx by 11/06/23 Mdro psa, mrsa, e faecalis, bacteroides Dapto/zerbax   01/01/24 id clinic f/u Patient comes for clinic f/u, from her snf She had remained off abx since 11/06/23 Reviewed recent labs Lab Results  Component Value Date   CRP 68.0 (H) 01/01/2024   Lab Results  Component Value Date   ESRSEDRATE 80 (H) 01/01/2024   Lab Results  Component Value Date   WBC 5.5 01/01/2024   HGB 10.8 (L) 01/01/2024   HCT 35.2 01/01/2024   MCV 90.0 01/01/2024   PLT 408 (H) 01/01/2024    She gets wound care packing at snf but no formal wound clinic visit She reports packing with dressing and some kind of cream  No fever, chill, decreased appetite No focal pain   01/30/24 id clinic f/u She is here from nursing home with provider See a&p for detail    Allergies  Allergen Reactions   Crestor [Rosuvastatin] Other (See Comments)    Muscle aches.    Fish Allergy Other (See Comments)      Outpatient Medications Prior to Visit  Medication Sig Dispense Refill   meloxicam (MOBIC) 15 MG tablet Take 15 mg by mouth daily.     acetaminophen (TYLENOL) 325 MG tablet Take 2 tablets (650 mg total) by mouth every 6 (six) hours as needed for mild pain (or Fever >/= 101).     albuterol (VENTOLIN HFA) 108 (90 Base) MCG/ACT inhaler INHALE TWO PUFFS BY MOUTH EVERY  6 HOURS AS NEEDED FOR SHORTNESS OF BREATH 18 g 3   alendronate (FOSAMAX) 70 MG tablet Take 1 tablet (70 mg total) by mouth once a week. (Patient not taking: Reported on 12/04/2023) 12 tablet 3   Blood Glucose Monitoring Suppl (ONETOUCH VERIO) w/Device KIT Use to check blood sugar 1 kit 0   Calcium Carb-Cholecalciferol (CALCIUM 600/VITAMIN D PO) Take 1 tablet by mouth daily. 600-200 mg Calcium-Vitamin D     clozapine (CLOZARIL) 200 MG tablet Take 1 tablet (200 mg total) by mouth at bedtime. 30 tablet 0   collagenase (SANTYL) 250 UNIT/GM ointment Apply 1 Application  topically daily. Apply to left hip topically in the afternoon for wound cleanse. Apply to left lateral knee topically in the afternoon for wound cleanse. (Patient not taking: Reported on 01/01/2024)     donepezil (ARICEPT) 10 MG tablet Take 1 tablet (10 mg total) by mouth at bedtime. 90 tablet 3   ezetimibe (ZETIA) 10 MG tablet Take 1 tablet (10 mg total) by mouth daily. 90 tablet 3   famotidine (PEPCID) 20 MG tablet Take 1 tablet (20 mg total) by mouth 2 (two) times daily. 180 tablet 0   fenofibrate 160 MG tablet Take 1 tablet (160 mg total) by mouth daily. 60 tablet 6   fluticasone-salmeterol (ADVAIR) 250-50 MCG/ACT AEPB Inhale 1 puff into the lungs in the morning and at bedtime.     Iron, Ferrous Sulfate, 325 (65 Fe) MG TABS Take 325 mg by mouth daily with breakfast. 60 tablet 4   memantine (NAMENDA) 5 MG tablet Take 1 tablet (5 mg total) by mouth 2 (two) times daily.     OneTouch Delica Lancets 33G MISC Use to check blood sugar daily 100 each 1   ONETOUCH VERIO test strip Use as instructed 100 each 2   oseltamivir (TAMIFLU) 30 MG capsule Take 30 mg by mouth 2 (two) times daily. (Patient not taking: Reported on 01/30/2024)     oxyCODONE (OXY IR/ROXICODONE) 5 MG immediate release tablet Take 1 tablet (5 mg total) by mouth every 6 (six) hours as needed for moderate pain (pain score 4-6). (Patient not taking: Reported on 12/04/2023) 10 tablet 0   SYMBICORT 160-4.5 MCG/ACT inhaler INHALE TWO PUFFS INTO THE LUNGS IN THE MORNING AND AT BEDTIME (Patient not taking: Reported on 12/04/2023) 10.2 g 2   vitamin C (ASCORBIC ACID) 500 MG tablet Take 500 mg by mouth every morning.     No facility-administered medications prior to visit.     Social History   Socioeconomic History   Marital status: Single    Spouse name: Not on file   Number of children: 0   Years of education: 12   Highest education level: Not on file  Occupational History   Not on file  Tobacco Use   Smoking status: Former    Current  packs/day: 0.00    Types: Cigarettes    Start date: 02/27/1997    Quit date: 02/28/2012    Years since quitting: 11.9   Smokeless tobacco: Former    Types: Associate Professor status: Never Used  Substance and Sexual Activity   Alcohol use: Not Currently    Comment: "socially"   Drug use: No   Sexual activity: Not Currently  Other Topics Concern   Not on file  Social History Narrative   Right handed   Drinks caffeine   Retired   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)   Social Drivers  of Health   Financial Resource Strain: Low Risk  (03/22/2023)   Overall Financial Resource Strain (CARDIA)    Difficulty of Paying Living Expenses: Not hard at all  Food Insecurity: No Food Insecurity (09/18/2023)   Hunger Vital Sign    Worried About Running Out of Food in the Last Year: Never true    Ran Out of Food in the Last Year: Never true  Transportation Needs: No Transportation Needs (09/18/2023)   PRAPARE - Administrator, Civil Service (Medical): No    Lack of Transportation (Non-Medical): No  Physical Activity: Insufficiently Active (03/22/2023)   Exercise Vital Sign    Days of Exercise per Week: 3 days    Minutes of Exercise per Session: 30 min  Stress: No Stress Concern Present (03/22/2023)   [employer] of Occupational Health - Occupational Stress Questionnaire    Feeling of Stress : Not at all  Social Connections: Socially Isolated (03/22/2023)   Social Connection and Isolation Panel [NHANES]    Frequency of Communication with Friends and Family: Three times a week    Frequency of Social Gatherings with Friends and Family: Never    Attends Religious Services: Never    Database administrator or Organizations: No    Attends Banker Meetings: Never    Marital Status: Never married  Intimate Partner Violence: Not At Risk (09/18/2023)   Humiliation, Afraid, Rape, and Kick questionnaire    Fear of Current or Ex-Partner: No     Emotionally Abused: No    Physically Abused: No    Sexually Abused: No      Review of Systems    All other ros negative  Objective:    There were no vitals taken for this visit. Nursing note and vital signs reviewed.  Physical Exam     General/constitutional: no distress, pleasant; slight nasal congestion sounding HEENT: Normocephalic, PER, Conj Clear, EOMI, Oropharynx clear Neck supple CV: rrr no mrg Lungs: clear to auscultation, normal respiratory effort Abd: Soft, Nontender Ext: no edema Skin/msk: healthy wound bed. No undermining - stage 3  12/04/23 picture left thigh wound   01/01/24 picture No surrounding cellulitis or exposed bone; but greenish exudates visualized and tissue doesn't appear as healthy as previous visit  No focal tenderness      01/30/24 pictures Wound appear to be more clean and smaller      Labs: Lab Results  Component Value Date   WBC 5.5 01/01/2024   HGB 10.8 (L) 01/01/2024   HCT 35.2 01/01/2024   MCV 90.0 01/01/2024   PLT 408 (H) 01/01/2024   Last metabolic panel Lab Results  Component Value Date   GLUCOSE 173 (H) 01/01/2024   NA 141 01/01/2024   K 3.8 01/01/2024   CL 104 01/01/2024   CO2 26 01/01/2024   BUN 14 01/01/2024   CREATININE 0.91 01/01/2024   GFRNONAA >60 10/27/2023   CALCIUM 9.6 01/01/2024   PROT 6.9 01/01/2024   ALBUMIN 2.5 (L) 09/20/2023   LABGLOB 2.1 07/13/2023   AGRATIO 2.3 (H) 11/17/2022   BILITOT 0.3 01/01/2024   ALKPHOS 39 09/20/2023   AST 89 (H) 01/01/2024   ALT 76 (H) 01/01/2024   ANIONGAP 10 10/27/2023      Component Value Date/Time   CRP 68.0 (H) 01/01/2024 1009   CRP 122.0 (H) 12/04/2023 1012   CRP 0.6 (H) 02/10/2010 2036   Lab Results  Component Value Date   ESRSEDRATE 80 (H) 01/01/2024    Micro:  Serology:  Imaging: Reviewed  09/17/23 ct left femur 1. Large soft tissue defect at the medial aspect of the mid to distal left thigh with absence of the subcutaneous fat.  The underlying anterior compartment musculature is exposed at the site of ulceration. There is air tracking to the underlying femur. Markedly abnormal appearance of the vastus medialis muscle of the mid to distal left thigh underlying site of ulceration with heterogeneously low attenuation appearance of the muscle with numerous foci of intramuscular air/gas. Findings are concerning for necrotizing myositis. 2. Near-circumferential proliferative periosteal elevation surrounding the mid left femoral diaphysis at the site of deep soft tissue wound/ulceration. Subtle irregularity of the cortex without well-defined erosion or bony destruction. Findings are highly suggestive of early acute osteomyelitis. 3. Small left knee joint effusion is nonspecific.  Assessment & Plan:   Problem List Items Addressed This Visit   None Visit Diagnoses       Osteomyelitis, unspecified site, unspecified type (HCC)    -  Primary   Relevant Medications   oseltamivir (TAMIFLU) 30 MG capsule   Other Relevant Orders   CBC   COMPLETE METABOLIC PANEL WITH GFR   C-reactive protein   Sedimentation rate     Open wound       Relevant Orders   CBC   COMPLETE METABOLIC PANEL WITH GFR   C-reactive protein   Sedimentation rate           No orders of the defined types were placed in this encounter.    78 year old female with history of dementia and dm, here for hospital f/u of left thigh nec fasc with femur osteomyelitis Last I&D 12/3; initial I&D 09/25/23 Previous cx Mdro pseudomonas, mrsa, e faecalis, and bacteroides   12/04/23 id assessment She came from snf Previous wound cx grew mdr pseudomonas requiring zerbax. Also on daptomycin which she finished on 11/06/23   Today the wound is still big but has great granulating healthy tissue She has sx of uri today for 2 days   Labs today  Will revisit in around 4-6 weeks to make sure she continues to improve and if so can discharge from  clinic   01/01/24 id assessment Chronic om s/p 6 week course; md pseudomonas and mrsa and e faecalis Elevated crp last visit Wound less healthy appearing today visit and has greenish exudate Will repeat inflammatory markers Refer to wound clinic as might need enzymatic debridement   If rising crp might need to get deep culture/more debridement -- will review need for repeat imaging   F/u 3-4 weeks Facility number [phone]  01/30/24 id clinic assessment Wound care hasn't been able to schedule her appointment as nursing home contact not possible We contacted wound clinic and made her an appointment today 4/17 @ 930 am Her wound looks more clean and less deep today She remains off antibiotics Get daily wound packing from nursing home  01/01/24 labs reviewed Crp 68, decreasing from 120s on 12/04/23 Sed rate 80, decreasing from 84 on 12/04/23  Repeat labs today Remain off antibiotics Follow up in 6 weeks    Follow-up: Return in about 6 weeks (around 03/12/2024).      Raymondo Band, MD [facility] 01/30/2024, 9:47 AM

## 2024-01-30 NOTE — Patient Instructions (Signed)
 See wound clinic on 4/17 @ 9am dr Duanne Guess   See me in 6 weeks from now   Remain off antibiotics   Labs to be done here today

## 2024-01-31 LAB — COMPLETE METABOLIC PANEL WITH GFR
AG Ratio: 1.5 (calc) (ref 1.0–2.5)
ALT: 44 U/L — ABNORMAL HIGH (ref 6–29)
AST: 56 U/L — ABNORMAL HIGH (ref 10–35)
Albumin: 4.5 g/dL (ref 3.6–5.1)
Alkaline phosphatase (APISO): 67 U/L (ref 37–153)
BUN: 19 mg/dL (ref 7–25)
CO2: 21 mmol/L (ref 20–32)
Calcium: 9.7 mg/dL (ref 8.6–10.4)
Chloride: 108 mmol/L (ref 98–110)
Creat: 0.97 mg/dL (ref 0.60–1.00)
Globulin: 3 g/dL (ref 1.9–3.7)
Glucose, Bld: 108 mg/dL — ABNORMAL HIGH (ref 65–99)
Potassium: 3.9 mmol/L (ref 3.5–5.3)
Sodium: 142 mmol/L (ref 135–146)
Total Bilirubin: 0.2 mg/dL (ref 0.2–1.2)
Total Protein: 7.5 g/dL (ref 6.1–8.1)

## 2024-01-31 LAB — CBC
HCT: 34.7 % — ABNORMAL LOW (ref 35.0–45.0)
Hemoglobin: 11.1 g/dL — ABNORMAL LOW (ref 11.7–15.5)
MCH: 28 pg (ref 27.0–33.0)
MCHC: 32 g/dL (ref 32.0–36.0)
MCV: 87.6 fL (ref 80.0–100.0)
MPV: 10.6 fL (ref 7.5–12.5)
Platelets: 310 10*3/uL (ref 140–400)
RBC: 3.96 10*6/uL (ref 3.80–5.10)
RDW: 14.3 % (ref 11.0–15.0)
WBC: 6.2 10*3/uL (ref 3.8–10.8)

## 2024-01-31 LAB — C-REACTIVE PROTEIN: CRP: 19.7 mg/L — ABNORMAL HIGH (ref <8.0)

## 2024-01-31 LAB — SEDIMENTATION RATE: Sed Rate: 67 mm/h — ABNORMAL HIGH (ref 0–30)

## 2024-02-29 ENCOUNTER — Encounter (HOSPITAL_BASED_OUTPATIENT_CLINIC_OR_DEPARTMENT_OTHER): Attending: General Surgery | Admitting: General Surgery

## 2024-02-29 DIAGNOSIS — M869 Osteomyelitis, unspecified: Secondary | ICD-10-CM | POA: Diagnosis not present

## 2024-02-29 DIAGNOSIS — G609 Hereditary and idiopathic neuropathy, unspecified: Secondary | ICD-10-CM | POA: Diagnosis not present

## 2024-02-29 DIAGNOSIS — F02818 Dementia in other diseases classified elsewhere, unspecified severity, with other behavioral disturbance: Secondary | ICD-10-CM | POA: Diagnosis not present

## 2024-02-29 DIAGNOSIS — L97125 Non-pressure chronic ulcer of left thigh with muscle involvement without evidence of necrosis: Secondary | ICD-10-CM | POA: Insufficient documentation

## 2024-02-29 DIAGNOSIS — E11622 Type 2 diabetes mellitus with other skin ulcer: Secondary | ICD-10-CM | POA: Insufficient documentation

## 2024-03-12 ENCOUNTER — Ambulatory Visit: Admitting: Internal Medicine

## 2024-03-12 ENCOUNTER — Other Ambulatory Visit: Payer: Self-pay

## 2024-03-12 VITALS — BP 104/66 | HR 107 | Temp 97.8°F

## 2024-03-12 DIAGNOSIS — M86152 Other acute osteomyelitis, left femur: Secondary | ICD-10-CM

## 2024-03-12 DIAGNOSIS — M726 Necrotizing fasciitis: Secondary | ICD-10-CM | POA: Diagnosis not present

## 2024-03-12 DIAGNOSIS — B965 Pseudomonas (aeruginosa) (mallei) (pseudomallei) as the cause of diseases classified elsewhere: Secondary | ICD-10-CM

## 2024-03-12 DIAGNOSIS — B9562 Methicillin resistant Staphylococcus aureus infection as the cause of diseases classified elsewhere: Secondary | ICD-10-CM | POA: Diagnosis not present

## 2024-03-12 DIAGNOSIS — B966 Bacteroides fragilis [B. fragilis] as the cause of diseases classified elsewhere: Secondary | ICD-10-CM

## 2024-03-12 DIAGNOSIS — B952 Enterococcus as the cause of diseases classified elsewhere: Secondary | ICD-10-CM | POA: Diagnosis not present

## 2024-03-12 DIAGNOSIS — Z8739 Personal history of other diseases of the musculoskeletal system and connective tissue: Secondary | ICD-10-CM

## 2024-03-12 DIAGNOSIS — T148XXA Other injury of unspecified body region, initial encounter: Secondary | ICD-10-CM

## 2024-03-12 NOTE — Patient Instructions (Addendum)
 I think your wound is looking ok   Labs today  If your labs improve, will not need more follow up   Continue to follow wound clinic

## 2024-03-12 NOTE — Progress Notes (Signed)
 [facility]  Patient Active Problem List   Diagnosis Date Noted   Anemia 10/01/2023   Type 2 diabetes mellitus without complication (HCC) 09/30/2023   Skin ulcer of left great toe (HCC) 09/28/2023   Left leg swelling 09/28/2023   Goals of care, counseling/discussion 09/25/2023   Necrotizing myositis 09/23/2023   Diabetes mellitus without complication (HCC) 09/23/2023   Dementia (HCC) 09/23/2023   Long term current use of clozapine  09/18/2023   Myositis of left thigh 09/18/2023   Osteomyelitis of left femur (HCC) 09/18/2023   Pressure injury of left thigh, stage 4 (HCC) 09/18/2023   Pressure injury of deep tissue of left thigh 09/17/2023   Rhabdomyolysis 08/10/2023   Transaminitis 08/10/2023   CAP (community acquired pneumonia) 08/10/2023   Lactic acidosis 08/10/2023   Elevated troponin 08/10/2023   Pressure injury of skin with suspected deep tissue injury 08/10/2023   Porokeratosis 01/02/2023   Dementia due to Alzheimer's disease (HCC) 08/11/2022   Statin myopathy 04/21/2022   Pain due to onychomycosis of toenails of both feet 02/22/2022   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) 11/22/2021   Chronic pain of right hip 09/15/2021   Chronic pain of left knee 09/15/2021   Chronic left shoulder pain 05/24/2021   Mixed hyperlipidemia 03/22/2021   Pincer nail deformity 11/03/2020   Chronic diarrhea 03/20/2019   Hypertension 08/22/2016   Normocytic anemia 08/22/2016   Major neurocognitive disorder due to multiple etiologies with behavioral disturbance (HCC) 08/22/2016   Osteoporosis 08/22/2016   Asthma, moderate persistent 09/08/2011   Diverticulosis 09/08/2011   Former smoker 09/08/2011   GERD 06/04/2007      Subjective:    Patient ID: Elizabeth Schwartz, female    DOB: March 09, 1946, 78 y.o.   MRN: [id number]  Chief Complaint  Patient presents with   Follow-up    HPI:  Elizabeth Schwartz is a 78 y.o. female with dementia/dm2  here for f/u hospital admission nec fasc/left femur om  Finished iv abx by 11/06/23 Mdro psa, mrsa, e faecalis, bacteroides Dapto/zerbax   01/01/24 id clinic f/u Patient comes for clinic f/u, from her snf She had remained off abx since 11/06/23 Reviewed recent labs Lab Results  Component Value Date   CRP 19.7 (H) 01/30/2024   Lab Results  Component Value Date   ESRSEDRATE 67 (H) 01/30/2024   Lab Results  Component Value Date   WBC 6.2 01/30/2024   HGB 11.1 (L) 01/30/2024   HCT 34.7 (L) 01/30/2024   MCV 87.6 01/30/2024   PLT 310 01/30/2024    She gets wound care packing at snf but no formal wound clinic visit She reports packing with dressing and some kind of cream  No fever, chill, decreased appetite No focal pain   03/12/24 id clinic f/u Seeing wound clinic; wound vac applied Feels well No f/c Minor intermittent pain improving in wound area; including numbness No purulence No other complaint   Allergies  Allergen Reactions   Crestor [Rosuvastatin] Other (See Comments)    Muscle aches.    Fish Allergy Other (See Comments)      Outpatient Medications Prior to Visit  Medication Sig Dispense Refill   acetaminophen  (TYLENOL ) 325 MG tablet Take 2 tablets (650 mg total) by mouth every 6 (six) hours as needed for mild pain (or Fever >/= 101).     albuterol  (VENTOLIN  HFA) 108 (90 Base) MCG/ACT inhaler INHALE TWO PUFFS BY MOUTH EVERY 6 HOURS AS NEEDED  FOR SHORTNESS OF BREATH 18 g 3   Blood Glucose Monitoring Suppl (ONETOUCH VERIO) w/Device KIT Use to check blood sugar 1 kit 0   Calcium  Carb-Cholecalciferol  (CALCIUM  600/VITAMIN D  PO) Take 1 tablet by mouth daily. 600-200 mg Calcium -Vitamin D      clozapine  (CLOZARIL ) 200 MG tablet Take 1 tablet (200 mg total) by mouth at bedtime. 30 tablet 0   donepezil  (ARICEPT ) 10 MG tablet Take 1 tablet (10 mg total) by mouth at bedtime. 90 tablet 3   ezetimibe  (ZETIA ) 10 MG tablet Take 1 tablet (10 mg total) by mouth daily. 90  tablet 3   famotidine  (PEPCID ) 20 MG tablet Take 1 tablet (20 mg total) by mouth 2 (two) times daily. 180 tablet 0   fenofibrate  160 MG tablet Take 1 tablet (160 mg total) by mouth daily. 60 tablet 6   fluticasone-salmeterol (ADVAIR) 250-50 MCG/ACT AEPB Inhale 1 puff into the lungs in the morning and at bedtime.     Iron , Ferrous Sulfate , 325 (65 Fe) MG TABS Take 325 mg by mouth daily with breakfast. 60 tablet 4   meloxicam (MOBIC) 15 MG tablet Take 15 mg by mouth daily.     memantine  (NAMENDA ) 5 MG tablet Take 1 tablet (5 mg total) by mouth 2 (two) times daily.     OneTouch Delica Lancets 33G MISC Use to check blood sugar daily 100 each 1   ONETOUCH VERIO test strip Use as instructed 100 each 2   vitamin C  (ASCORBIC ACID ) 500 MG tablet Take 500 mg by mouth every morning.     alendronate  (FOSAMAX ) 70 MG tablet Take 1 tablet (70 mg total) by mouth once a week. (Patient not taking: Reported on 12/04/2023) 12 tablet 3   collagenase (SANTYL) 250 UNIT/GM ointment Apply 1 Application topically daily. Apply to left hip topically in the afternoon for wound cleanse. Apply to left lateral knee topically in the afternoon for wound cleanse. (Patient not taking: Reported on 01/01/2024)     oseltamivir (TAMIFLU) 30 MG capsule Take 30 mg by mouth 2 (two) times daily. (Patient not taking: Reported on 03/12/2024)     oxyCODONE  (OXY IR/ROXICODONE ) 5 MG immediate release tablet Take 1 tablet (5 mg total) by mouth every 6 (six) hours as needed for moderate pain (pain score 4-6). (Patient not taking: Reported on 03/12/2024) 10 tablet 0   SYMBICORT  160-4.5 MCG/ACT inhaler INHALE TWO PUFFS INTO THE LUNGS IN THE MORNING AND AT BEDTIME (Patient not taking: Reported on 12/04/2023) 10.2 g 2   No facility-administered medications prior to visit.     Social History   Socioeconomic History   Marital status: Single    Spouse name: Not on file   Number of children: 0   Years of education: 12   Highest education level: Not on  file  Occupational History   Not on file  Tobacco Use   Smoking status: Former    Current packs/day: 0.00    Types: Cigarettes    Start date: 02/27/1997    Quit date: 02/28/2012    Years since quitting: 12.0   Smokeless tobacco: Former    Types: Engineer, drilling   Vaping status: Never Used  Substance and Sexual Activity   Alcohol use: Not Currently    Comment: "socially"   Drug use: No   Sexual activity: Not Currently  Other Topics Concern   Not on file  Social History Narrative   Right handed   Drinks caffeine   Retired   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)   Social Drivers of Health   Financial Resource Strain: Low Risk  (03/22/2023)   Overall Financial Resource Strain (CARDIA)    Difficulty of Paying Living Expenses: Not hard at all  Food Insecurity: No Food Insecurity (09/18/2023)   Hunger Vital Sign    Worried About Running Out of Food in the Last Year: Never true    Ran Out of Food in the Last Year: Never true  Transportation Needs: No Transportation Needs (09/18/2023)   PRAPARE - Administrator, Civil Service (Medical): No    Lack of Transportation (Non-Medical): No  Physical Activity: Insufficiently Active (03/22/2023)   Exercise Vital Sign    Days of Exercise per Week: 3 days    Minutes of Exercise per Session: 30 min  Stress: No Stress Concern Present (03/22/2023)   [employer] of Occupational Health - Occupational Stress Questionnaire    Feeling of Stress : Not at all  Social Connections: Socially Isolated (03/22/2023)   Social Connection and Isolation Panel [NHANES]    Frequency of Communication with Friends and Family: Three times a week    Frequency of Social Gatherings with Friends and Family: Never    Attends Religious Services: Never    Database administrator or Organizations: No    Attends Banker Meetings: Never    Marital Status: Never married  Intimate Partner Violence: Not At Risk (09/18/2023)    Humiliation, Afraid, Rape, and Kick questionnaire    Fear of Current or Ex-Partner: No    Emotionally Abused: No    Physically Abused: No    Sexually Abused: No      Review of Systems    All other ros negative  Objective:    BP 104/66   Pulse (!) 107   Temp 97.8 F (36.6 C) (Temporal)   SpO2 95%  Nursing note and vital signs reviewed.  Physical Exam     General/constitutional: no distress, pleasant; slight nasal congestion sounding HEENT: Normocephalic, PER, Conj Clear, EOMI, Oropharynx clear Neck supple CV: rrr no mrg Lungs: clear to auscultation, normal respiratory effort Abd: Soft, Nontender Ext: no edema Skin/msk: healthy wound bed. No undermining - stage 3  12/04/23 picture left thigh wound   01/01/24 picture No surrounding cellulitis or exposed bone; but greenish exudates visualized and tissue doesn't appear as healthy as previous visit  No focal tenderness      01/30/24 pictures Wound appear to be more clean and smaller     03/12/24 pictures     Labs: Lab Results  Component Value Date   WBC 6.2 01/30/2024   HGB 11.1 (L) 01/30/2024   HCT 34.7 (L) 01/30/2024   MCV 87.6 01/30/2024   PLT 310 01/30/2024   Last metabolic panel Lab Results  Component Value Date   GLUCOSE 108 (H) 01/30/2024   NA 142 01/30/2024   K 3.9 01/30/2024   CL 108 01/30/2024   CO2 21 01/30/2024   BUN 19 01/30/2024   CREATININE 0.97 01/30/2024   GFRNONAA >60 10/27/2023   CALCIUM  9.7 01/30/2024   PROT 7.5 01/30/2024   ALBUMIN 2.5 (L) 09/20/2023   LABGLOB 2.1 07/13/2023   AGRATIO 2.3 (H) 11/17/2022   BILITOT 0.2 01/30/2024   ALKPHOS 39 09/20/2023   AST 56 (H) 01/30/2024   ALT 44 (H) 01/30/2024   ANIONGAP 10 10/27/2023      Component Value Date/Time   CRP 19.7 (H) 01/30/2024 1026   CRP 68.0 (H)  01/01/2024 1009   CRP 122.0 (H) 12/04/2023 1012   Lab Results  Component Value Date   ESRSEDRATE 67 (H) 01/30/2024     Micro:  Serology:  Imaging: Reviewed  09/17/23 ct left femur 1. Large soft tissue defect at the medial aspect of the mid to distal left thigh with absence of the subcutaneous fat. The underlying anterior compartment musculature is exposed at the site of ulceration. There is air tracking to the underlying femur. Markedly abnormal appearance of the vastus medialis muscle of the mid to distal left thigh underlying site of ulceration with heterogeneously low attenuation appearance of the muscle with numerous foci of intramuscular air/gas. Findings are concerning for necrotizing myositis. 2. Near-circumferential proliferative periosteal elevation surrounding the mid left femoral diaphysis at the site of deep soft tissue wound/ulceration. Subtle irregularity of the cortex without well-defined erosion or bony destruction. Findings are highly suggestive of early acute osteomyelitis. 3. Small left knee joint effusion is nonspecific.  Assessment & Plan:   Problem List Items Addressed This Visit   None        No orders of the defined types were placed in this encounter.    78 year old female with history of dementia and dm, here for hospital f/u of left thigh nec fasc with femur osteomyelitis Last I&D 12/3; initial I&D 09/25/23 Previous cx Mdro pseudomonas, mrsa, e faecalis, and bacteroides   12/04/23 id assessment She came from snf Previous wound cx grew mdr pseudomonas requiring zerbax. Also on daptomycin  which she finished on 11/06/23   Today the wound is still big but has great granulating healthy tissue She has sx of uri today for 2 days   Labs today  Will revisit in around 4-6 weeks to make sure she continues to improve and if so can discharge from clinic   01/01/24 id assessment Chronic om s/p 6 week course; md pseudomonas and mrsa and e faecalis Elevated crp last visit Wound less healthy appearing today visit and has greenish exudate Will repeat  inflammatory markers Refer to wound clinic as might need enzymatic debridement   If rising crp might need to get deep culture/more debridement -- will review need for repeat imaging   F/u 3-4 weeks Facility number [phone]  01/30/24 id clinic assessment Wound care hasn't been able to schedule her appointment as nursing home contact not possible We contacted wound clinic and made her an appointment today 4/17 @ 930 am Her wound looks more clean and less deep today She remains off antibiotics Get daily wound packing from nursing home  01/01/24 labs reviewed Crp 68, decreasing from 120s on 12/04/23 Sed rate 80, decreasing from 84 on 12/04/23  Repeat labs today Remain off antibiotics Follow up in 6 weeks   03/12/24 id clinic assessment 02/29/24 wound care visit -- debrided necrotic tissue left thigh wound and application of wound vac. F/u 2 weeks planned Remains off abx since end of 10/2023 Some numbness/burning around the wound but better Good appetite; no fever, chill   Wound looks clean; no necrosis; purulence; size remains the same.    F/u wound care If crp continues to improve, will not schedule any further f/u Will consider repeat imaging to assess bony changes if rising crp/sed rate or clinical concern for recurrent bone infection     Follow-up: No follow-ups on file.      Jamesetta Mcbride, MD [facility] 03/12/2024, 10:24 AM

## 2024-03-13 ENCOUNTER — Telehealth: Payer: Self-pay

## 2024-03-13 LAB — COMPLETE METABOLIC PANEL WITHOUT GFR
AG Ratio: 1.6 (calc) (ref 1.0–2.5)
ALT: 24 U/L (ref 6–29)
AST: 31 U/L (ref 10–35)
Albumin: 4.4 g/dL (ref 3.6–5.1)
Alkaline phosphatase (APISO): 52 U/L (ref 37–153)
BUN: 24 mg/dL (ref 7–25)
CO2: 25 mmol/L (ref 20–32)
Calcium: 9.8 mg/dL (ref 8.6–10.4)
Chloride: 106 mmol/L (ref 98–110)
Creat: 0.94 mg/dL (ref 0.60–1.00)
Globulin: 2.8 g/dL (ref 1.9–3.7)
Glucose, Bld: 120 mg/dL — ABNORMAL HIGH (ref 65–99)
Potassium: 4.4 mmol/L (ref 3.5–5.3)
Sodium: 141 mmol/L (ref 135–146)
Total Bilirubin: 0.4 mg/dL (ref 0.2–1.2)
Total Protein: 7.2 g/dL (ref 6.1–8.1)

## 2024-03-13 LAB — CBC
HCT: 33.6 % — ABNORMAL LOW (ref 35.0–45.0)
Hemoglobin: 10.7 g/dL — ABNORMAL LOW (ref 11.7–15.5)
MCH: 28.4 pg (ref 27.0–33.0)
MCHC: 31.8 g/dL — ABNORMAL LOW (ref 32.0–36.0)
MCV: 89.1 fL (ref 80.0–100.0)
MPV: 10 fL (ref 7.5–12.5)
Platelets: 305 10*3/uL (ref 140–400)
RBC: 3.77 10*6/uL — ABNORMAL LOW (ref 3.80–5.10)
RDW: 14.4 % (ref 11.0–15.0)
WBC: 6.9 10*3/uL (ref 3.8–10.8)

## 2024-03-13 LAB — C-REACTIVE PROTEIN: CRP: 15 mg/L — ABNORMAL HIGH (ref <8.0)

## 2024-03-13 LAB — SEDIMENTATION RATE: Sed Rate: 55 mm/h — ABNORMAL HIGH (ref 0–30)

## 2024-03-13 NOTE — Telephone Encounter (Signed)
 I attempted to reach out to the patient.  No answer. LVM for patient to call back. Venisha Boehning Adel Holt, CMA

## 2024-03-13 NOTE — Telephone Encounter (Signed)
-----   Message from [place] sent at 03/13/2024  8:13 AM EDT ----- Elizabeth Schwartz Better numbers   No need to see in follow up  Thanks

## 2024-03-14 ENCOUNTER — Encounter (HOSPITAL_BASED_OUTPATIENT_CLINIC_OR_DEPARTMENT_OTHER): Attending: General Surgery | Admitting: General Surgery

## 2024-03-14 DIAGNOSIS — E11622 Type 2 diabetes mellitus with other skin ulcer: Secondary | ICD-10-CM | POA: Diagnosis present

## 2024-03-14 DIAGNOSIS — L97125 Non-pressure chronic ulcer of left thigh with muscle involvement without evidence of necrosis: Secondary | ICD-10-CM | POA: Diagnosis not present

## 2024-03-14 DIAGNOSIS — F02818 Dementia in other diseases classified elsewhere, unspecified severity, with other behavioral disturbance: Secondary | ICD-10-CM | POA: Insufficient documentation

## 2024-03-14 DIAGNOSIS — M869 Osteomyelitis, unspecified: Secondary | ICD-10-CM | POA: Insufficient documentation

## 2024-03-14 DIAGNOSIS — G309 Alzheimer's disease, unspecified: Secondary | ICD-10-CM | POA: Diagnosis not present

## 2024-03-14 NOTE — Telephone Encounter (Signed)
 Left voicemail asking patient to return my call.   Yvonne Stopher Lesli Albee, CMA

## 2024-03-19 NOTE — Telephone Encounter (Signed)
 Third attempt to reach patient - left voicemail asking for patient to return my call.   Salihah Peckham Roann Chestnut, CMA

## 2024-03-26 ENCOUNTER — Ambulatory Visit: Attending: Critical Care Medicine

## 2024-03-28 ENCOUNTER — Encounter (HOSPITAL_BASED_OUTPATIENT_CLINIC_OR_DEPARTMENT_OTHER): Admitting: Internal Medicine

## 2024-03-28 DIAGNOSIS — E11622 Type 2 diabetes mellitus with other skin ulcer: Secondary | ICD-10-CM | POA: Diagnosis not present

## 2024-04-17 ENCOUNTER — Ambulatory Visit (HOSPITAL_BASED_OUTPATIENT_CLINIC_OR_DEPARTMENT_OTHER): Admitting: General Surgery

## 2024-04-18 ENCOUNTER — Encounter (HOSPITAL_BASED_OUTPATIENT_CLINIC_OR_DEPARTMENT_OTHER): Attending: General Surgery | Admitting: General Surgery

## 2024-04-18 DIAGNOSIS — F02818 Dementia in other diseases classified elsewhere, unspecified severity, with other behavioral disturbance: Secondary | ICD-10-CM | POA: Insufficient documentation

## 2024-04-18 DIAGNOSIS — L97125 Non-pressure chronic ulcer of left thigh with muscle involvement without evidence of necrosis: Secondary | ICD-10-CM | POA: Diagnosis not present

## 2024-04-18 DIAGNOSIS — M869 Osteomyelitis, unspecified: Secondary | ICD-10-CM | POA: Insufficient documentation

## 2024-04-18 DIAGNOSIS — G309 Alzheimer's disease, unspecified: Secondary | ICD-10-CM | POA: Diagnosis not present

## 2024-04-18 DIAGNOSIS — E1169 Type 2 diabetes mellitus with other specified complication: Secondary | ICD-10-CM | POA: Insufficient documentation

## 2024-04-18 DIAGNOSIS — E11622 Type 2 diabetes mellitus with other skin ulcer: Secondary | ICD-10-CM | POA: Insufficient documentation

## 2024-05-07 ENCOUNTER — Encounter (HOSPITAL_BASED_OUTPATIENT_CLINIC_OR_DEPARTMENT_OTHER): Admitting: General Surgery

## 2024-05-07 DIAGNOSIS — E11622 Type 2 diabetes mellitus with other skin ulcer: Secondary | ICD-10-CM | POA: Diagnosis not present

## 2024-05-21 ENCOUNTER — Ambulatory Visit (HOSPITAL_BASED_OUTPATIENT_CLINIC_OR_DEPARTMENT_OTHER): Admitting: General Surgery

## 2024-09-10 ENCOUNTER — Emergency Department (HOSPITAL_COMMUNITY)

## 2024-09-10 ENCOUNTER — Encounter (HOSPITAL_COMMUNITY): Payer: Self-pay

## 2024-09-10 ENCOUNTER — Inpatient Hospital Stay (HOSPITAL_COMMUNITY)
Admission: EM | Admit: 2024-09-10 | Discharge: 2024-09-17 | DRG: 871 | Disposition: A | Discharge status: Skilled Nursing Facility | Source: Skilled Nursing Facility | Attending: Internal Medicine | Admitting: Internal Medicine

## 2024-09-10 ENCOUNTER — Other Ambulatory Visit: Payer: Self-pay

## 2024-09-10 DIAGNOSIS — E119 Type 2 diabetes mellitus without complications: Secondary | ICD-10-CM | POA: Diagnosis present

## 2024-09-10 DIAGNOSIS — R6520 Severe sepsis without septic shock: Secondary | ICD-10-CM | POA: Diagnosis present

## 2024-09-10 DIAGNOSIS — J9601 Acute respiratory failure with hypoxia: Secondary | ICD-10-CM | POA: Diagnosis not present

## 2024-09-10 DIAGNOSIS — Z66 Do not resuscitate: Secondary | ICD-10-CM | POA: Diagnosis present

## 2024-09-10 DIAGNOSIS — U071 COVID-19: Secondary | ICD-10-CM | POA: Diagnosis present

## 2024-09-10 DIAGNOSIS — I951 Orthostatic hypotension: Secondary | ICD-10-CM | POA: Diagnosis present

## 2024-09-10 DIAGNOSIS — A419 Sepsis, unspecified organism: Principal | ICD-10-CM | POA: Diagnosis present

## 2024-09-10 DIAGNOSIS — Z7951 Long term (current) use of inhaled steroids: Secondary | ICD-10-CM

## 2024-09-10 DIAGNOSIS — J189 Pneumonia, unspecified organism: Secondary | ICD-10-CM | POA: Diagnosis present

## 2024-09-10 DIAGNOSIS — F03918 Unspecified dementia, unspecified severity, with other behavioral disturbance: Secondary | ICD-10-CM

## 2024-09-10 DIAGNOSIS — I10 Essential (primary) hypertension: Secondary | ICD-10-CM | POA: Diagnosis present

## 2024-09-10 DIAGNOSIS — Z91013 Allergy to seafood: Secondary | ICD-10-CM

## 2024-09-10 DIAGNOSIS — R609 Edema, unspecified: Secondary | ICD-10-CM | POA: Diagnosis present

## 2024-09-10 DIAGNOSIS — K219 Gastro-esophageal reflux disease without esophagitis: Secondary | ICD-10-CM | POA: Diagnosis present

## 2024-09-10 DIAGNOSIS — E785 Hyperlipidemia, unspecified: Secondary | ICD-10-CM | POA: Diagnosis present

## 2024-09-10 DIAGNOSIS — R197 Diarrhea, unspecified: Secondary | ICD-10-CM | POA: Diagnosis present

## 2024-09-10 DIAGNOSIS — Z79899 Other long term (current) drug therapy: Secondary | ICD-10-CM

## 2024-09-10 DIAGNOSIS — M810 Age-related osteoporosis without current pathological fracture: Secondary | ICD-10-CM | POA: Diagnosis present

## 2024-09-10 DIAGNOSIS — L89224 Pressure ulcer of left hip, stage 4: Secondary | ICD-10-CM | POA: Diagnosis present

## 2024-09-10 DIAGNOSIS — J440 Chronic obstructive pulmonary disease with acute lower respiratory infection: Secondary | ICD-10-CM | POA: Diagnosis present

## 2024-09-10 DIAGNOSIS — Z794 Long term (current) use of insulin: Secondary | ICD-10-CM

## 2024-09-10 DIAGNOSIS — Z4800 Encounter for change or removal of nonsurgical wound dressing: Secondary | ICD-10-CM

## 2024-09-10 DIAGNOSIS — Z90710 Acquired absence of both cervix and uterus: Secondary | ICD-10-CM

## 2024-09-10 DIAGNOSIS — Z791 Long term (current) use of non-steroidal anti-inflammatories (NSAID): Secondary | ICD-10-CM

## 2024-09-10 DIAGNOSIS — F0390 Unspecified dementia without behavioral disturbance: Secondary | ICD-10-CM | POA: Diagnosis present

## 2024-09-10 DIAGNOSIS — E1165 Type 2 diabetes mellitus with hyperglycemia: Secondary | ICD-10-CM | POA: Diagnosis present

## 2024-09-10 DIAGNOSIS — Z87891 Personal history of nicotine dependence: Secondary | ICD-10-CM

## 2024-09-10 DIAGNOSIS — Z888 Allergy status to other drugs, medicaments and biological substances status: Secondary | ICD-10-CM

## 2024-09-10 LAB — CBC
HCT: 35.9 % — ABNORMAL LOW (ref 36.0–46.0)
Hemoglobin: 11.4 g/dL — ABNORMAL LOW (ref 12.0–15.0)
MCH: 29.8 pg (ref 26.0–34.0)
MCHC: 31.8 g/dL (ref 30.0–36.0)
MCV: 94 fL (ref 80.0–100.0)
Platelets: 226 K/uL (ref 150–400)
RBC: 3.82 MIL/uL — ABNORMAL LOW (ref 3.87–5.11)
RDW: 14.9 % (ref 11.5–15.5)
WBC: 9.1 K/uL (ref 4.0–10.5)
nRBC: 0 % (ref 0.0–0.2)

## 2024-09-10 LAB — BASIC METABOLIC PANEL WITH GFR
Anion gap: 14 (ref 5–15)
BUN: 33 mg/dL — ABNORMAL HIGH (ref 8–23)
CO2: 20 mmol/L — ABNORMAL LOW (ref 22–32)
Calcium: 8.7 mg/dL — ABNORMAL LOW (ref 8.9–10.3)
Chloride: 104 mmol/L (ref 98–111)
Creatinine, Ser: 1.75 mg/dL — ABNORMAL HIGH (ref 0.44–1.00)
GFR, Estimated: 29 mL/min — ABNORMAL LOW (ref >60)
Glucose, Bld: 245 mg/dL — ABNORMAL HIGH (ref 70–99)
Potassium: 3.9 mmol/L (ref 3.5–5.1)
Sodium: 138 mmol/L (ref 135–145)

## 2024-09-10 LAB — COMPREHENSIVE METABOLIC PANEL WITH GFR
ALT: 33 U/L (ref 0–44)
AST: 80 U/L — ABNORMAL HIGH (ref 15–41)
Albumin: 3.8 g/dL (ref 3.5–5.0)
Alkaline Phosphatase: 44 U/L (ref 38–126)
Anion gap: 15 (ref 5–15)
BUN: 34 mg/dL — ABNORMAL HIGH (ref 8–23)
CO2: 20 mmol/L — ABNORMAL LOW (ref 22–32)
Calcium: 8.7 mg/dL — ABNORMAL LOW (ref 8.9–10.3)
Chloride: 104 mmol/L (ref 98–111)
Creatinine, Ser: 1.97 mg/dL — ABNORMAL HIGH (ref 0.44–1.00)
GFR, Estimated: 25 mL/min — ABNORMAL LOW (ref >60)
Glucose, Bld: 262 mg/dL — ABNORMAL HIGH (ref 70–99)
Potassium: 4.3 mmol/L (ref 3.5–5.1)
Sodium: 139 mmol/L (ref 135–145)
Total Bilirubin: 0.3 mg/dL (ref 0.0–1.2)
Total Protein: 7 g/dL (ref 6.5–8.1)

## 2024-09-10 LAB — BLOOD GAS, ARTERIAL
Acid-base deficit: 1.8 mmol/L (ref 0.0–2.0)
Bicarbonate: 22.2 mmol/L (ref 20.0–28.0)
Drawn by: 31394
O2 Saturation: 99.4 %
PIP: 14 cmH2O
Patient temperature: 37.1
pCO2 arterial: 35 mmHg (ref 32–48)
pH, Arterial: 7.41 (ref 7.35–7.45)
pO2, Arterial: 145 mmHg — ABNORMAL HIGH (ref 83–108)

## 2024-09-10 LAB — RESP PANEL BY RT-PCR (RSV, FLU A&B, COVID)  RVPGX2
Influenza A by PCR: NEGATIVE
Influenza B by PCR: NEGATIVE
Resp Syncytial Virus by PCR: NEGATIVE
SARS Coronavirus 2 by RT PCR: POSITIVE — AB

## 2024-09-10 LAB — I-STAT CG4 LACTIC ACID, ED
Lactic Acid, Venous: 1.5 mmol/L (ref 0.5–1.9)
Lactic Acid, Venous: 2.3 mmol/L (ref 0.5–1.9)

## 2024-09-10 LAB — LACTIC ACID, PLASMA
Lactic Acid, Venous: 1.9 mmol/L (ref 0.5–1.9)
Lactic Acid, Venous: 2.1 mmol/L (ref 0.5–1.9)

## 2024-09-10 LAB — TROPONIN T, HIGH SENSITIVITY
Troponin T High Sensitivity: 29 ng/L — ABNORMAL HIGH (ref 0–19)
Troponin T High Sensitivity: 26 ng/L — ABNORMAL HIGH (ref 0–19)

## 2024-09-10 LAB — PRO BRAIN NATRIURETIC PEPTIDE: Pro Brain Natriuretic Peptide: 324 pg/mL — ABNORMAL HIGH (ref <300.0)

## 2024-09-10 MED ORDER — DEXAMETHASONE SOD PHOSPHATE PF 10 MG/ML IJ SOLN
6.0000 mg | Freq: Once | INTRAMUSCULAR | Status: AC
  Administered 2024-09-10: 6 mg via INTRAVENOUS

## 2024-09-10 MED ORDER — VITAMIN C 500 MG PO TABS
500.0000 mg | ORAL_TABLET | Freq: Every morning | ORAL | Status: DC
  Administered 2024-09-11: 500 mg via ORAL
  Filled 2024-09-10: qty 1

## 2024-09-10 MED ORDER — OYSTER SHELL CALCIUM/D3 500-5 MG-MCG PO TABS
1.0000 | ORAL_TABLET | Freq: Every day | ORAL | Status: DC
  Administered 2024-09-11 – 2024-09-17 (×7): 1 via ORAL
  Filled 2024-09-10 (×7): qty 1

## 2024-09-10 MED ORDER — DONEPEZIL HCL 10 MG PO TABS
10.0000 mg | ORAL_TABLET | Freq: Every day | ORAL | Status: DC
  Administered 2024-09-10 – 2024-09-17 (×8): 10 mg via ORAL
  Filled 2024-09-10 (×8): qty 1

## 2024-09-10 MED ORDER — LACTATED RINGERS IV BOLUS (SEPSIS)
1000.0000 mL | Freq: Once | INTRAVENOUS | Status: AC
  Administered 2024-09-10: 1000 mL via INTRAVENOUS

## 2024-09-10 MED ORDER — DEXAMETHASONE SOD PHOSPHATE PF 10 MG/ML IJ SOLN
6.0000 mg | INTRAMUSCULAR | Status: DC
  Administered 2024-09-11 – 2024-09-15 (×5): 6 mg via INTRAVENOUS

## 2024-09-10 MED ORDER — MEMANTINE HCL 10 MG PO TABS
5.0000 mg | ORAL_TABLET | Freq: Two times a day (BID) | ORAL | Status: DC
  Administered 2024-09-10 – 2024-09-17 (×15): 5 mg via ORAL
  Filled 2024-09-10 (×15): qty 1

## 2024-09-10 MED ORDER — LACTATED RINGERS IV BOLUS
500.0000 mL | Freq: Once | INTRAVENOUS | Status: AC
  Administered 2024-09-10: 500 mL via INTRAVENOUS

## 2024-09-10 MED ORDER — SODIUM CHLORIDE 0.9 % IV SOLN
1.0000 g | INTRAVENOUS | Status: DC
  Administered 2024-09-11: 1 g via INTRAVENOUS
  Filled 2024-09-10: qty 10

## 2024-09-10 MED ORDER — SODIUM CHLORIDE 0.9 % IV SOLN
2.0000 g | Freq: Once | INTRAVENOUS | Status: AC
  Administered 2024-09-10: 2 g via INTRAVENOUS
  Filled 2024-09-10: qty 20

## 2024-09-10 MED ORDER — ENOXAPARIN SODIUM 30 MG/0.3ML IJ SOSY: 30.0000 mg | PREFILLED_SYRINGE | INTRAMUSCULAR | Status: DC

## 2024-09-10 MED ORDER — FAMOTIDINE 20 MG PO TABS
20.0000 mg | ORAL_TABLET | Freq: Two times a day (BID) | ORAL | Status: DC
  Administered 2024-09-10 – 2024-09-11 (×2): 20 mg via ORAL
  Filled 2024-09-10 (×2): qty 1

## 2024-09-10 MED ORDER — ADULT MULTIVITAMIN W/MINERALS CH
1.0000 | ORAL_TABLET | Freq: Every day | ORAL | Status: DC
  Administered 2024-09-11 – 2024-09-17 (×7): 1 via ORAL
  Filled 2024-09-10 (×7): qty 1

## 2024-09-10 MED ORDER — ENOXAPARIN SODIUM 40 MG/0.4ML IJ SOSY
40.0000 mg | PREFILLED_SYRINGE | INTRAMUSCULAR | Status: DC
  Administered 2024-09-10: 40 mg via SUBCUTANEOUS
  Filled 2024-09-10: qty 0.4

## 2024-09-10 MED ORDER — IPRATROPIUM-ALBUTEROL 0.5-2.5 (3) MG/3ML IN SOLN
3.0000 mL | Freq: Once | RESPIRATORY_TRACT | Status: AC
  Administered 2024-09-10: 3 mL via RESPIRATORY_TRACT
  Filled 2024-09-10: qty 3

## 2024-09-10 MED ORDER — ONDANSETRON HCL 4 MG PO TABS: 4.0000 mg | ORAL_TABLET | Freq: Four times a day (QID) | ORAL | Status: DC | PRN

## 2024-09-10 MED ORDER — LACTATED RINGERS IV SOLN: INTRAVENOUS | Status: AC

## 2024-09-10 MED ORDER — BISACODYL 5 MG PO TBEC: 5.0000 mg | DELAYED_RELEASE_TABLET | Freq: Every day | ORAL | Status: DC | PRN

## 2024-09-10 MED ORDER — CALCIUM CARB-CHOLECALCIFEROL 600-20 MG-MCG PO TABS: 1.0000 | ORAL_TABLET | Freq: Every day | ORAL | Status: DC

## 2024-09-10 MED ORDER — ACETAMINOPHEN 325 MG PO TABS
650.0000 mg | ORAL_TABLET | Freq: Four times a day (QID) | ORAL | Status: DC | PRN
  Administered 2024-09-16: 650 mg via ORAL
  Filled 2024-09-10: qty 2

## 2024-09-10 MED ORDER — SODIUM CHLORIDE 0.9 % IV SOLN
500.0000 mg | Freq: Once | INTRAVENOUS | Status: AC
  Administered 2024-09-10: 500 mg via INTRAVENOUS
  Filled 2024-09-10: qty 5

## 2024-09-10 MED ORDER — LACTATED RINGERS IV BOLUS (SEPSIS)
250.0000 mL | Freq: Once | INTRAVENOUS | Status: AC
  Administered 2024-09-10: 250 mL via INTRAVENOUS

## 2024-09-10 MED ORDER — ACETAMINOPHEN 650 MG RE SUPP
650.0000 mg | Freq: Four times a day (QID) | RECTAL | Status: DC | PRN
Start: 2024-09-10 — End: 2024-09-18

## 2024-09-10 MED ORDER — ONDANSETRON HCL 4 MG/2ML IJ SOLN: 4.0000 mg | Freq: Four times a day (QID) | INTRAMUSCULAR | Status: DC | PRN

## 2024-09-10 MED ORDER — AZITHROMYCIN 250 MG PO TABS
250.0000 mg | ORAL_TABLET | Freq: Every day | ORAL | Status: DC
  Administered 2024-09-11: 250 mg via ORAL
  Filled 2024-09-10: qty 1

## 2024-09-10 MED ORDER — LACTATED RINGERS IV BOLUS: 500.0000 mL | Freq: Once | INTRAVENOUS | Status: DC

## 2024-09-10 NOTE — Sepsis Progress Note (Signed)
 Notified bedside nurse of need to draw repeat lactic acid #3 at 1800. (Floor nurse).

## 2024-09-10 NOTE — H&P (Addendum)
 History and Physical    Patient: Elizabeth Schwartz FMW:[id number] DOB: 11/15/1945 DOA: 09/10/2024 DOS: the patient was seen and examined on 09/10/2024 PCP: Brien Belvie BRAVO, MD  Patient coming from: SNF  Chief Complaint:  Chief Complaint  Patient presents with   Shortness of Breath   HPI: Elizabeth Schwartz is a 78 y.o. female with medical history significant for dementia, recent stage IV pressure injury of the left thigh with necrotizing myositis and osteomyelitis who was sent in from the nursing home because of low O2 sats.   She tested positive for COVID yesterday in the nursing facility.  Today the patient who is usually ambulatory was not able to get out of bed and she also appeared short of breath. Her sats were found to be in the 80s so she was sent in.  On arrival in the emergency department she required heated high flow to keep her sats up.  She remains covid positive.  The patient will be admitted to the hospitalist service for further management.  She did receive IV Decadron  in the emergency department.   Review of Systems: unable to review all systems due to the inability of the patient to answer questions. Past Medical History:  Diagnosis Date   Anemia    COPD (chronic obstructive pulmonary disease) (HCC)    Dementia (HCC)    Depression    Diabetes mellitus    GERD (gastroesophageal reflux disease)    Hyperlipidemia    Hypertension    Orthostatic hypotension 09/25/2023   Osteoporosis    Schizophrenia, schizo-affective (HCC)    SOB (shortness of breath) on exertion    Syncope 06/11/2019   Past Surgical History:  Procedure Laterality Date   BREAST BIOPSY Right 2017   benign   INCISION AND DRAINAGE OF WOUND Left 09/25/2023   Procedure: IRRIGATION AND DEBRIDEMENT  THIGH WOUND;  Surgeon: Waddell Leonce NOVAK, MD;  Location: [facility] OR;  Service: Plastics;  Laterality: Left;   INCISION AND DRAINAGE OF WOUND Left 10/04/2023   Procedure: left thigh wound evaluation under  anesthesia, debridement, removal of Sorbact;  Surgeon: Waddell Leonce NOVAK, MD;  Location: [facility] OR;  Service: Plastics;  Laterality: Left;   LAPAROSCOPY  1975   ABD pain   MINOR IRRIGATION AND DEBRIDEMENT OF WOUND Left 10/17/2023   Procedure: left thigh wound evaluation, irrigation and debridement left thigh wound;  Surgeon: Waddell Leonce NOVAK, MD;  Location: [facility] OR;  Service: Plastics;  Laterality: Left;   VAGINAL HYSTERECTOMY  1975   Social History:  reports that she quit smoking about 12 years ago. Her smoking use included cigarettes. She started smoking about 27 years ago. She has quit using smokeless tobacco.  Her smokeless tobacco use included chew. She reports that she does not currently use alcohol. She reports that she does not use drugs.  Allergies  Allergen Reactions   Crestor [Rosuvastatin] Other (See Comments)    Muscle aches and Allergic, per facility    Fish Allergy Other (See Comments)    Allergic, per facility   Shellfish Allergy Other (See Comments)    Possibly allergic. Paperwork from the facility cited fish as a KNOWN ALLERGY.    Family History  Problem Relation Age of Onset   Breast cancer Neg Hx     Prior to Admission medications   Medication Sig Start Date End Date Taking? Authorizing Provider  acetaminophen  (TYLENOL ) 325 MG tablet Take 2 tablets (650 mg total) by mouth every 6 (six) hours as needed for mild pain (  or Fever >/= 101). Patient taking differently: Take 650 mg by mouth every 6 (six) hours as needed for mild pain (pain score 1-3) (and CANNOT EXCEED A SUM TOTAL OF 3,000 MG/DAY). 08/18/23  Yes Ghimire, Donalda HERO, MD  Alum & Mag Hydroxide-Simeth (MYLANTA DOUBLE-STRENGTH PO) Take 10 mLs by mouth every 4 (four) hours as needed (for indigestion, heartburn, gas, or nausea).   Yes [provider]  Calcium  Carb-Cholecalciferol  (CALCIUM  600+D3) 600-20 MG-MCG TABS Take 1 tablet by mouth daily.   Yes [provider]  clozapine  (CLOZARIL ) 200 MG  tablet Take 1 tablet (200 mg total) by mouth at bedtime. 10/28/23  Yes Theophilus Pagan, MD  donepezil  (ARICEPT ) 10 MG tablet Take 1 tablet (10 mg total) by mouth at bedtime. 10/28/23  Yes Theophilus Pagan, MD  famotidine  (PEPCID ) 20 MG tablet Take 1 tablet (20 mg total) by mouth 2 (two) times daily. 07/13/23  Yes Brien Belvie BRAVO, MD  fenofibrate  160 MG tablet Take 1 tablet (160 mg total) by mouth daily. 07/13/23  Yes Brien Belvie BRAVO, MD  fluticasone-salmeterol (ADVAIR) 250-50 MCG/ACT AEPB Inhale 1 puff into the lungs in the morning and at bedtime.   Yes [provider]  Iron , Ferrous Sulfate , 325 (65 Fe) MG TABS Take 325 mg by mouth daily with breakfast. 11/17/22  Yes Brien Belvie BRAVO, MD  memantine  (NAMENDA ) 5 MG tablet Take 1 tablet (5 mg total) by mouth 2 (two) times daily. 10/28/23  Yes Theophilus Pagan, MD  nystatin cream (MYCOSTATIN) Apply 1 Application topically See admin instructions. Apply to peri-wound on left thigh every day shift for rash   Yes [provider]  ondansetron  (ZOFRAN ) 4 MG tablet Take 4 mg by mouth every 8 (eight) hours as needed for nausea or vomiting (may dissolve in 5 ml's of water, if necessary).   Yes [provider]  vitamin C  (ASCORBIC ACID ) 500 MG tablet Take 500 mg by mouth every morning.   Yes [provider]  albuterol  (VENTOLIN  HFA) 108 (90 Base) MCG/ACT inhaler INHALE TWO PUFFS BY MOUTH EVERY 6 HOURS AS NEEDED FOR SHORTNESS OF BREATH 10/28/23   Theophilus Pagan, MD  alendronate  (FOSAMAX ) 70 MG tablet Take 1 tablet (70 mg total) by mouth once a week. 07/13/23   Brien Belvie BRAVO, MD  Blood Glucose Monitoring Suppl [facility] VERIO) w/Device KIT Use to check blood sugar 07/13/23   Brien Belvie BRAVO, MD  ezetimibe  (ZETIA ) 10 MG tablet Take 1 tablet (10 mg total) by mouth daily. Patient taking differently: Take 10 mg by mouth at bedtime. 07/13/23   Brien Belvie BRAVO, MD  meloxicam (MOBIC) 15 MG tablet Take 15 mg by mouth daily.  11/26/23   [provider]  OneTouch Delica Lancets 33G MISC Use to check blood sugar daily 07/13/23   Brien Belvie BRAVO, MD  [facility] VERIO test strip Use as instructed 07/13/23   Brien Belvie BRAVO, MD  oxyCODONE  (OXY IR/ROXICODONE ) 5 MG immediate release tablet Take 1 tablet (5 mg total) by mouth every 6 (six) hours as needed for moderate pain (pain score 4-6). Patient not taking: Reported on 09/10/2024 11/01/23   Theophilus Pagan, MD  SYMBICORT  160-4.5 MCG/ACT inhaler INHALE TWO PUFFS INTO THE LUNGS IN THE MORNING AND AT BEDTIME Patient not taking: Reported on 09/10/2024 07/13/23   Brien Belvie BRAVO, MD    Physical Exam: Vitals:   09/10/24 1424 09/10/24 1431 09/10/24 1436 09/10/24 1440  BP:  138/66    Pulse:  (!) 113 (!) 121 (!) 113  Resp:  (!) 28 (!) 26 (!) 23  Temp:      TempSrc:      SpO2: 97% 91% 95% 95%   Physical Exam:  General: Ill appearing, well developed, well nourished HEENT: Normocephalic, atraumatic, PERRL Cardiovascular: Normal rate and rhythm. Dorsalis pedal pulses faint Pulmonary: Coarse breath sounds bilaterally, tachypneic, frequent junky cough Gastrointestinal: Nondistended abdomen, soft, non-tender, hypoactive bowel sounds Musculoskeletal:Normal ROM, no lower ext edema Skin: Skin is warm and dry. Her thigh was not examined.  The patient was still fully dressed. Neuro: No facial asymmetry.  She does follow simple commands.  She is somnolent or sedated. AAO to name PSYCH: Attentive and cooperative  Data Reviewed:  Results for orders placed or performed during the hospital encounter of 09/10/24 (from the past 24 hours)  Basic metabolic panel     Status: Abnormal   Collection Time: 09/10/24  2:14 PM  Result Value Ref Range   Sodium 138 135 - 145 mmol/L   Potassium 3.9 3.5 - 5.1 mmol/L   Chloride 104 98 - 111 mmol/L   CO2 20 (L) 22 - 32 mmol/L   Glucose, Bld 245 (H) 70 - 99 mg/dL   BUN 33 (H) 8 - 23 mg/dL   Creatinine, Ser 8.24 (H) 0.44 - 1.00 mg/dL    Calcium  8.7 (L) 8.9 - 10.3 mg/dL   GFR, Estimated 29 (L) >60 mL/min   Anion gap 14 5 - 15  CBC     Status: Abnormal   Collection Time: 09/10/24  2:14 PM  Result Value Ref Range   WBC 9.1 4.0 - 10.5 K/uL   RBC 3.82 (L) 3.87 - 5.11 MIL/uL   Hemoglobin 11.4 (L) 12.0 - 15.0 g/dL   HCT 64.0 (L) 63.9 - 53.9 %   MCV 94.0 80.0 - 100.0 fL   MCH 29.8 26.0 - 34.0 pg   MCHC 31.8 30.0 - 36.0 g/dL   RDW 85.0 88.4 - 84.4 %   Platelets 226 150 - 400 K/uL   nRBC 0.0 0.0 - 0.2 %  I-Stat CG4 Lactic Acid     Status: None   Collection Time: 09/10/24  2:16 PM  Result Value Ref Range   Lactic Acid, Venous 1.5 0.5 - 1.9 mmol/L     Assessment and Plan: Covid - Continue Decadron  started by the ED - Continue oxygen  as needed - The onset of her symptoms is not known.  She is not a candidate for remdesivir because she is on high flow nasal cannula but she may be a candidate for the immunomodulators. Will check LFTs to see if she qualifies. - Patient did meet sepsis criteria and was started on Rocephin  and Zithromax .  Her chest x-ray does reveal some infiltrate and a small effusion will continue the Rocephin  and Zithromax  empirically though this could all be from Covid.  2. Dementia - Continue Namenda   3. H/o Left thigh wound - evaluate left thigh in am. - Wound care consult   Advance Care Planning:   Code Status: Prior The patient has a yellow out of facility DNR form. There is a picture of it in the media section of the chart   Consults: none  Family Communication: none  Severity of Illness: The appropriate patient status for this patient is INPATIENT. Inpatient status is judged to be reasonable and necessary in order to provide the required intensity of service to ensure the patient's safety. The patient's presenting symptoms, physical exam findings, and initial radiographic and laboratory data in the  context of their chronic comorbidities is felt to place them at high risk for further clinical  deterioration. Furthermore, it is not anticipated that the patient will be medically stable for discharge from the hospital within 2 midnights of admission.   * I certify that at the point of admission it is my clinical judgment that the patient will require inpatient hospital care spanning beyond 2 midnights from the point of admission due to high intensity of service, high risk for further deterioration and high frequency of surveillance required.*  Author: ARTHEA CHILD, MD 09/10/2024 3:10 PM  For on call review [email or web address].

## 2024-09-10 NOTE — ED Provider Notes (Signed)
 [place] EMERGENCY DEPARTMENT AT [facility] Provider Note   CSN: [id number] Arrival date & time: 09/10/24  1351     Patient presents with: Shortness of Breath   Elizabeth Schwartz is a 78 y.o. female.   78 year old female presents for evaluation of shortness of breath.  Per nursing home she tested positive for COVID and today was hypoxic.  They found her in the 80s on room air and she is placed on nasal cannula shortly.  EMS put her on nonrebreather.  Patient has frequent coughing and appears fatigued.  Per nursing home she is usually ambulatory today was unable to get out of bed as she was feeling weak.  Patient denies any other symptoms or concerns at this time.   Shortness of Breath Associated symptoms: cough   Associated symptoms: no abdominal pain, no chest pain, no ear pain, no fever, no rash, no sore throat and no vomiting        Prior to Admission medications   Medication Sig Start Date End Date Taking? Authorizing Provider  acetaminophen  (TYLENOL ) 325 MG tablet Take 2 tablets (650 mg total) by mouth every 6 (six) hours as needed for mild pain (or Fever >/= 101). Patient taking differently: Take 650 mg by mouth every 6 (six) hours as needed for mild pain (pain score 1-3) (and CANNOT EXCEED A SUM TOTAL OF 3,000 MG/DAY). 08/18/23  Yes Ghimire, Donalda HERO, MD  Alum & Mag Hydroxide-Simeth (MYLANTA DOUBLE-STRENGTH PO) Take 10 mLs by mouth every 4 (four) hours as needed (for indigestion, heartburn, gas, or nausea).   Yes [provider]  Calcium  Carb-Cholecalciferol  (CALCIUM  600+D3) 600-20 MG-MCG TABS Take 1 tablet by mouth daily.   Yes [provider]  clozapine  (CLOZARIL ) 200 MG tablet Take 1 tablet (200 mg total) by mouth at bedtime. 10/28/23  Yes Theophilus Pagan, MD  DAKINS EX Apply 1 application  topically See admin instructions. Apply to left thigh wound once a day for wound healing. Clean with Dakin's solution, apply Honeyfiber to wound bed,  nystatin cream to peri-wound, and dress with ABD pad + tape- daily and as needed.   Yes [provider]  donepezil  (ARICEPT ) 10 MG tablet Take 1 tablet (10 mg total) by mouth at bedtime. 10/28/23  Yes Theophilus Pagan, MD  ezetimibe  (ZETIA ) 10 MG tablet Take 1 tablet (10 mg total) by mouth daily. Patient taking differently: Take 10 mg by mouth at bedtime. 07/13/23  Yes Brien Belvie BRAVO, MD  famotidine  (PEPCID ) 20 MG tablet Take 1 tablet (20 mg total) by mouth 2 (two) times daily. 07/13/23  Yes Brien Belvie BRAVO, MD  fenofibrate  160 MG tablet Take 1 tablet (160 mg total) by mouth daily. 07/13/23  Yes Brien Belvie BRAVO, MD  fluticasone-salmeterol (ADVAIR) 250-50 MCG/ACT AEPB Inhale 1 puff into the lungs in the morning and at bedtime.   Yes [provider]  Iron , Ferrous Sulfate , 325 (65 Fe) MG TABS Take 325 mg by mouth daily with breakfast. 11/17/22  Yes Brien Belvie BRAVO, MD  memantine  (NAMENDA ) 5 MG tablet Take 1 tablet (5 mg total) by mouth 2 (two) times daily. 10/28/23  Yes Theophilus Pagan, MD  nystatin cream (MYCOSTATIN) Apply 1 Application topically See admin instructions. Apply to peri-wound on left thigh every day shift for rash   Yes [provider]  ondansetron  (ZOFRAN ) 4 MG tablet Take 4 mg by mouth every 8 (eight) hours as needed for nausea or vomiting (may dissolve in 5 ml's of water, if  necessary).   Yes [provider]  vitamin C  (ASCORBIC ACID ) 500 MG tablet Take 500 mg by mouth every morning.   Yes [provider]  albuterol  (VENTOLIN  HFA) 108 (90 Base) MCG/ACT inhaler INHALE TWO PUFFS BY MOUTH EVERY 6 HOURS AS NEEDED FOR SHORTNESS OF BREATH Patient not taking: Reported on 09/10/2024 10/28/23   Theophilus Pagan, MD  alendronate  (FOSAMAX ) 70 MG tablet Take 1 tablet (70 mg total) by mouth once a week. Patient not taking: Reported on 09/10/2024 07/13/23   Brien Belvie BRAVO, MD  Blood Glucose Monitoring Suppl [facility] VERIO) w/Device KIT Use to  check blood sugar 07/13/23   Brien Belvie BRAVO, MD  OneTouch Delica Lancets 33G MISC Use to check blood sugar daily 07/13/23   Brien Belvie BRAVO, MD  [facility] VERIO test strip Use as instructed 07/13/23   Brien Belvie BRAVO, MD  oxyCODONE  (OXY IR/ROXICODONE ) 5 MG immediate release tablet Take 1 tablet (5 mg total) by mouth every 6 (six) hours as needed for moderate pain (pain score 4-6). Patient not taking: Reported on 09/10/2024 11/01/23   Theophilus Pagan, MD  SYMBICORT  160-4.5 MCG/ACT inhaler INHALE TWO PUFFS INTO THE LUNGS IN THE MORNING AND AT BEDTIME Patient not taking: Reported on 09/10/2024 07/13/23   Brien Belvie BRAVO, MD    Allergies: Crestor [rosuvastatin], Fish allergy, and Shellfish allergy    Review of Systems  Constitutional:  Positive for fatigue. Negative for chills and fever.  HENT:  Negative for ear pain and sore throat.   Eyes:  Negative for pain and visual disturbance.  Respiratory:  Positive for cough and shortness of breath.   Cardiovascular:  Negative for chest pain and palpitations.  Gastrointestinal:  Negative for abdominal pain and vomiting.  Genitourinary:  Negative for dysuria and hematuria.  Musculoskeletal:  Negative for arthralgias and back pain.  Skin:  Negative for color change and rash.  Neurological:  Negative for seizures and syncope.  All other systems reviewed and are negative.   Updated Vital Signs BP 138/68   Pulse (!) 102   Temp 98.7 F (37.1 C) (Axillary)   Resp (!) 22   SpO2 100%   Physical Exam Vitals and nursing note reviewed.  Constitutional:      General: She is in acute distress.     Appearance: She is well-developed. She is ill-appearing.  HENT:     Head: Normocephalic and atraumatic.  Eyes:     Conjunctiva/sclera: Conjunctivae normal.  Cardiovascular:     Rate and Rhythm: Regular rhythm. Tachycardia present.     Heart sounds: No murmur heard. Pulmonary:     Effort: Respiratory distress present.     Breath sounds: Wheezing  and rhonchi present.  Abdominal:     Palpations: Abdomen is soft.     Tenderness: There is no abdominal tenderness.  Musculoskeletal:        General: No swelling.     Cervical back: Neck supple.  Skin:    General: Skin is warm and dry.     Capillary Refill: Capillary refill takes less than 2 seconds.  Neurological:     Mental Status: She is alert.  Psychiatric:        Mood and Affect: Mood normal.     (all labs ordered are listed, but only abnormal results are displayed) Labs Reviewed  RESP PANEL BY RT-PCR (RSV, FLU A&B, COVID)  RVPGX2 - Abnormal; Notable for the following components:      Result Value   SARS Coronavirus 2 by RT  PCR POSITIVE (*)    All other components within normal limits  BASIC METABOLIC PANEL WITH GFR - Abnormal; Notable for the following components:   CO2 20 (*)    Glucose, Bld 245 (*)    BUN 33 (*)    Creatinine, Ser 1.75 (*)    Calcium  8.7 (*)    GFR, Estimated 29 (*)    All other components within normal limits  CBC - Abnormal; Notable for the following components:   RBC 3.82 (*)    Hemoglobin 11.4 (*)    HCT 35.9 (*)    All other components within normal limits  I-STAT CG4 LACTIC ACID, ED - Abnormal; Notable for the following components:   Lactic Acid, Venous 2.3 (*)    All other components within normal limits  CULTURE, BLOOD (ROUTINE X 2)  CULTURE, BLOOD (ROUTINE X 2)  PRO BRAIN NATRIURETIC PEPTIDE  COMPREHENSIVE METABOLIC PANEL WITH GFR  BLOOD GAS, ARTERIAL  I-STAT CG4 LACTIC ACID, ED  TROPONIN T, HIGH SENSITIVITY  TROPONIN T, HIGH SENSITIVITY    EKG: EKG Interpretation Date/Time:  Tuesday September 10 2024 14:19:39 EDT Ventricular Rate:  114 PR Interval:  175 QRS Duration:  101 QT Interval:  326 QTC Calculation: 449 R Axis:   -57  Text Interpretation: Sinus tachycardia Left anterior fascicular block Probable left ventricular hypertrophy Anterior Q waves, possibly due to LVH Abnormal T, consider ischemia, lateral leads Compared with  prior EKG from 09/17/2023 Confirmed by Gennaro Bouchard ([id number]) on 09/10/2024 2:20:43 PM  Radiology: [facility] Chest Port 1 View Result Date: 09/10/2024 EXAM: 1 VIEW(S) XRAY OF THE CHEST 09/10/2024 02:25:17 PM COMPARISON: 09/17/2023 CLINICAL HISTORY: [id number] SOB (shortness of breath) [id number]. Per chart - Pt BIB EMS from [facility]. Pt tested + for covid yesterday and is having weakness and shob today. Pt is normally ambulatory but has not been today per facility. FINDINGS: LUNGS AND PLEURA: Mild left basilar atelectasis or infiltrate is noted. Possible small left pleural effusion. No pneumothorax. HEART AND MEDIASTINUM: No acute abnormality of the cardiac and mediastinal silhouettes. BONES AND SOFT TISSUES: No acute osseous abnormality. Severe degenerative changes seen involving the left humeral joint. IMPRESSION: 1. Mild left basilar atelectasis or infiltrate with possible small left pleural effusion. Electronically signed by: Lynwood Seip MD 09/10/2024 02:41 PM EDT RP Workstation: [id number]     Procedures   Medications Ordered in the ED  lactated ringers  bolus 1,000 mL (1,000 mLs Intravenous New Bag/Given 09/10/24 1432)    And  lactated ringers  bolus 1,000 mL (1,000 mLs Intravenous New Bag/Given 09/10/24 1432)    And  lactated ringers  bolus 250 mL (250 mLs Intravenous New Bag/Given 09/10/24 1614)  enoxaparin  (LOVENOX ) injection 40 mg (has no administration in time range)  acetaminophen  (TYLENOL ) tablet 650 mg (has no administration in time range)    Or  acetaminophen  (TYLENOL ) suppository 650 mg (has no administration in time range)  ondansetron  (ZOFRAN ) tablet 4 mg (has no administration in time range)    Or  ondansetron  (ZOFRAN ) injection 4 mg (has no administration in time range)  bisacodyl (DULCOLAX) EC tablet 5 mg (has no administration in time range)  dexamethasone  (DECADRON ) injection 6 mg (has no administration in time range)  multivitamin with minerals tablet 1 tablet (0 tablets Oral  Hold 09/10/24 1548)  ipratropium-albuterol  (DUONEB) 0.5-2.5 (3) MG/3ML nebulizer solution 3 mL (3 mLs Nebulization Given 09/10/24 1424)  cefTRIAXone  (ROCEPHIN ) 2 g in sodium chloride  0.9 % 100 mL IVPB (0 g Intravenous Stopped 09/10/24 1512)  azithromycin  (  ZITHROMAX ) 500 mg in sodium chloride  0.9 % 250 mL IVPB (0 mg Intravenous Stopped 09/10/24 1617)  dexamethasone  (DECADRON ) injection 6 mg (6 mg Intravenous Given 09/10/24 1432)                                    Medical Decision Making Cardiac monitor interpretation: Sinus tachycardia, no ectopy  Social determinants of health: lives in a nursing home, history of dementia  Patient with recent positive COVID test at the nursing home sent in for hypoxia.  She was hypoxic in the 70s to 80s on room air and EMS placed on nonrebreather.  Here she was switched to high flow nasal cannula at 14 L/min.  She has a productive cough and appears fatigued.  Tachycardic as well.  She has evidence of pneumonia on her x-ray and I will treat her for sepsis with a fluid bolus as well as broad-spectrum antibiotics.  Discussed patient's case with hospitalist and patient may benefit from medical management.  Patient agreeable with the plan.  Problems Addressed: Acute hypoxic respiratory failure (HCC): acute illness or injury that poses a threat to life or bodily functions COVID-19: acute illness or injury Pneumonia due to infectious organism, unspecified laterality, unspecified part of lung: acute illness or injury that poses a threat to life or bodily functions Sepsis, due to unspecified organism, unspecified whether acute organ dysfunction present [facility]): acute illness or injury that poses a threat to life or bodily functions  Amount and/or Complexity of Data Reviewed Independent Historian: EMS    Details: He does tell provide history when nursing home called, patient was hypoxic on their arrival she was placed on nonrebreather External Data Reviewed: notes.     Details: Outpatient records reviewed and patient follows up outpatient.  Wound care of her foot Labs: ordered. Decision-making details documented in ED Course.    Details: Ordered and reviewed by me and patient has a leukocytosis Radiology: ordered and independent interpretation performed. Decision-making details documented in ED Course.    Details: Ordered and interpreted independently radiology Chest x-ray showed bilateral infiltrates, small pleural effusion on the left ECG/medicine tests: ordered and independent interpretation performed. Decision-making details documented in ED Course.    Details: In the absence of cardiology sinus tachycardia, no STEMI or significant change when compared to prior EKG Discussion of management or test interpretation with external provider(s): Dr. Arthea -hospitalist-I spoke with her the phone regarding the patient's case and she will with the patient for further workup and management  Risk OTC drugs. Prescription drug management. Drug therapy requiring intensive monitoring for toxicity. Decision regarding hospitalization. Diagnosis or treatment significantly limited by social determinants of health. Risk Details: CRITICAL CARE Performed by: Duwaine LITTIE Fusi   Total critical care time: 35 minutes  Critical care time was exclusive of separately billable procedures and treating other patients.  Critical care was necessary to treat or prevent imminent or life-threatening deterioration.  Critical care was time spent personally by me on the following activities: development of treatment plan with patient and/or surrogate as well as nursing, discussions with consultants, evaluation of patient's response to treatment, examination of patient, obtaining history from patient or surrogate, ordering and performing treatments and interventions, ordering and review of laboratory studies, ordering and review of radiographic studies, pulse oximetry and re-evaluation of  patient's condition.   Critical Care Total time providing critical care: 35 minutes     Final diagnoses:  Acute  hypoxic respiratory failure (HCC)  Sepsis, due to unspecified organism, unspecified whether acute organ dysfunction present [facility])  Pneumonia due to infectious organism, unspecified laterality, unspecified part of lung  COVID-19    ED Discharge Orders     None          Gennaro Duwaine CROME, DO 09/10/24 1631

## 2024-09-10 NOTE — ED Triage Notes (Signed)
 Pt BIB EMS from [facility]. Pt tested + for covid yesterday and is having weakness and shob today. Pt is normally ambulatory but has not been today per facility.

## 2024-09-10 NOTE — Consult Note (Addendum)
 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; had I&D 07/2023 and followed at wound care center until 04/2024 with last use of silver collagen dressing  Reason for Consult: L thigh wound  Wound type: full thickness post trauma, necrotizing myositis and I&D as above  Pressure Injury POA: NA not pressure  Measurement: see nursing flowsheet  Wound bed: appears 80% red moist 20% tan  Drainage (amount, consistency, odor) see nursing flowsheet  Periwound:scar tissue  Dressing procedure/placement/frequency: Cleanse L thigh wound with Vashe, using a Q tip applicator apply silver hydrofiber Lawson #[id number] [employer]) to wound bed daily and secure with silicone foam or ABD pad and tape whichever is preferred.   POC discussed with bedside nurse. WOC team will not follow. Re-consult if further needs arise.   Thank you,    Powell Bar MSN, RN-BC, [employer]

## 2024-09-10 NOTE — Progress Notes (Signed)
 Patient NTS'd at this time. Copious amounts of tan, thick secretions obtained down the right nare with no complications. Vitals remained stable.

## 2024-09-10 NOTE — Sepsis Progress Note (Signed)
 Elink monitoring for the code sepsis protocol.

## 2024-09-10 NOTE — Sepsis Progress Note (Signed)
 Notified provider and bedside nurse of need to order and draw repeat lactic acid #3 @ 1800.

## 2024-09-11 DIAGNOSIS — J9601 Acute respiratory failure with hypoxia: Secondary | ICD-10-CM | POA: Diagnosis not present

## 2024-09-11 DIAGNOSIS — U071 COVID-19: Secondary | ICD-10-CM | POA: Diagnosis not present

## 2024-09-11 LAB — BLOOD CULTURE ID PANEL (REFLEXED) - BCID2

## 2024-09-11 LAB — COMPREHENSIVE METABOLIC PANEL WITH GFR
ALT: 28 U/L (ref 0–44)
AST: 70 U/L — ABNORMAL HIGH (ref 15–41)
Albumin: 3.6 g/dL (ref 3.5–5.0)
Alkaline Phosphatase: 48 U/L (ref 38–126)
Anion gap: 11 (ref 5–15)
BUN: 39 mg/dL — ABNORMAL HIGH (ref 8–23)
CO2: 24 mmol/L (ref 22–32)
Calcium: 8.6 mg/dL — ABNORMAL LOW (ref 8.9–10.3)
Chloride: 106 mmol/L (ref 98–111)
Creatinine, Ser: 1.46 mg/dL — ABNORMAL HIGH (ref 0.44–1.00)
GFR, Estimated: 36 mL/min — ABNORMAL LOW (ref >60)
Glucose, Bld: 248 mg/dL — ABNORMAL HIGH (ref 70–99)
Potassium: 4.1 mmol/L (ref 3.5–5.1)
Sodium: 140 mmol/L (ref 135–145)
Total Bilirubin: 0.3 mg/dL (ref 0.0–1.2)
Total Protein: 6.8 g/dL (ref 6.5–8.1)

## 2024-09-11 LAB — MRSA NEXT GEN BY PCR, NASAL: MRSA by PCR Next Gen: NOT DETECTED

## 2024-09-11 LAB — CBC
HCT: 33.7 % — ABNORMAL LOW (ref 36.0–46.0)
Hemoglobin: 10.6 g/dL — ABNORMAL LOW (ref 12.0–15.0)
MCH: 29.9 pg (ref 26.0–34.0)
MCHC: 31.5 g/dL (ref 30.0–36.0)
MCV: 95.2 fL (ref 80.0–100.0)
Platelets: 232 K/uL (ref 150–400)
RBC: 3.54 MIL/uL — ABNORMAL LOW (ref 3.87–5.11)
RDW: 14.8 % (ref 11.5–15.5)
WBC: 11.7 K/uL — ABNORMAL HIGH (ref 4.0–10.5)
nRBC: 0 % (ref 0.0–0.2)

## 2024-09-11 LAB — GLUCOSE, CAPILLARY
Glucose-Capillary: 174 mg/dL — ABNORMAL HIGH (ref 70–99)
Glucose-Capillary: 198 mg/dL — ABNORMAL HIGH (ref 70–99)
Glucose-Capillary: 152 mg/dL — ABNORMAL HIGH (ref 70–99)

## 2024-09-11 MED ORDER — REMDESIVIR 100 MG IV SOLR
100.0000 mg | Freq: Every day | INTRAVENOUS | Status: AC
  Administered 2024-09-12 – 2024-09-13 (×2): 100 mg via INTRAVENOUS
  Filled 2024-09-11 (×3): qty 20

## 2024-09-11 MED ORDER — FAMOTIDINE 20 MG PO TABS
20.0000 mg | ORAL_TABLET | Freq: Every day | ORAL | Status: DC
  Administered 2024-09-12 – 2024-09-17 (×6): 20 mg via ORAL
  Filled 2024-09-11 (×6): qty 1

## 2024-09-11 MED ORDER — CLOZAPINE 100 MG PO TABS
200.0000 mg | ORAL_TABLET | Freq: Every day | ORAL | Status: DC
  Administered 2024-09-11 – 2024-09-17 (×7): 200 mg via ORAL
  Filled 2024-09-11 (×7): qty 2

## 2024-09-11 MED ORDER — ENOXAPARIN SODIUM 40 MG/0.4ML IJ SOSY
40.0000 mg | PREFILLED_SYRINGE | INTRAMUSCULAR | Status: DC
  Administered 2024-09-11 – 2024-09-17 (×7): 40 mg via SUBCUTANEOUS
  Filled 2024-09-11 (×7): qty 0.4

## 2024-09-11 MED ORDER — MELATONIN 5 MG PO TABS
5.0000 mg | ORAL_TABLET | Freq: Every evening | ORAL | Status: AC | PRN
  Administered 2024-09-11 – 2024-09-14 (×4): 5 mg via ORAL
  Filled 2024-09-11 (×4): qty 1

## 2024-09-11 MED ORDER — INSULIN ASPART 100 UNIT/ML IJ SOLN
0.0000 [IU] | Freq: Four times a day (QID) | INTRAMUSCULAR | Status: DC
  Administered 2024-09-11 (×3): 2 [IU] via SUBCUTANEOUS
  Administered 2024-09-12: 1 [IU] via SUBCUTANEOUS
  Administered 2024-09-12: 2 [IU] via SUBCUTANEOUS
  Administered 2024-09-12: 1 [IU] via SUBCUTANEOUS
  Administered 2024-09-12: 3 [IU] via SUBCUTANEOUS
  Administered 2024-09-13: 5 [IU] via SUBCUTANEOUS
  Administered 2024-09-13: 1 [IU] via SUBCUTANEOUS
  Administered 2024-09-14: 5 [IU] via SUBCUTANEOUS
  Administered 2024-09-14: 3 [IU] via SUBCUTANEOUS
  Administered 2024-09-14: 2 [IU] via SUBCUTANEOUS
  Administered 2024-09-14 – 2024-09-15 (×2): 5 [IU] via SUBCUTANEOUS
  Administered 2024-09-15: 1 [IU] via SUBCUTANEOUS
  Administered 2024-09-15: 2 [IU] via SUBCUTANEOUS
  Administered 2024-09-15: 5 [IU] via SUBCUTANEOUS
  Administered 2024-09-16: 3 [IU] via SUBCUTANEOUS
  Administered 2024-09-16: 1 [IU] via SUBCUTANEOUS
  Administered 2024-09-16: 2 [IU] via SUBCUTANEOUS

## 2024-09-11 MED ORDER — SODIUM CHLORIDE 0.9 % IV SOLN
200.0000 mg | Freq: Once | INTRAVENOUS | Status: AC
  Administered 2024-09-11: 200 mg via INTRAVENOUS
  Filled 2024-09-11: qty 40

## 2024-09-11 NOTE — Plan of Care (Signed)

## 2024-09-11 NOTE — TOC Initial Note (Signed)
 Transition of Care [facility]) - Initial/Assessment Note    Patient Details  Name: Elizabeth Schwartz MRN: [id number] Date of Birth: Aug 28, 1946  Transition of Care [facility]) CM/SW Contact:    Tawni CHRISTELLA Eva, LCSW Phone Number: 09/11/2024, 2:40 PM  Clinical Narrative:                 Pt is a LTC resident at [facility] and can return at the end of hospitalization. Care management to follow.   Expected Discharge Plan: Long Term Nursing Home Barriers to Discharge: Continued Medical Work up   Patient Goals and CMS Choice Patient states their goals for this hospitalization and ongoing recovery are:: retrun to LTC facility          Expected Discharge Plan and Services                                              Prior Living Arrangements/Services                       Activities of Daily Living   ADL Screening (condition at time of admission) Independently performs ADLs?: No Does the patient have a NEW difficulty with bathing/dressing/toileting/self-feeding that is expected to last >3 days?: No Does the patient have a NEW difficulty with getting in/out of bed, walking, or climbing stairs that is expected to last >3 days?: No Does the patient have a NEW difficulty with communication that is expected to last >3 days?: No Is the patient deaf or have difficulty hearing?: No Does the patient have difficulty seeing, even when wearing glasses/contacts?: No Does the patient have difficulty concentrating, remembering, or making decisions?: Yes  Permission Sought/Granted                  Emotional Assessment              Admission diagnosis:  Pneumonia due to infectious organism, unspecified laterality, unspecified part of lung [J18.9] Sepsis, due to unspecified organism, unspecified whether acute organ dysfunction present (HCC) [A41.9] Acute hypoxemic respiratory failure due to COVID-19 (HCC) [U07.1, J96.01] Acute hypoxic respiratory failure (HCC)  [J96.01] COVID-19 [U07.1] Patient Active Problem List   Diagnosis Date Noted   Acute hypoxemic respiratory failure due to COVID-19 (HCC) 09/10/2024   Anemia 10/01/2023   Type 2 diabetes mellitus without complication 09/30/2023   Skin ulcer of left great toe (HCC) 09/28/2023   Left leg swelling 09/28/2023   Goals of care, counseling/discussion 09/25/2023   Necrotizing myositis 09/23/2023   Diabetes mellitus without complication (HCC) 09/23/2023   Dementia (HCC) 09/23/2023   Long term current use of clozapine  09/18/2023   Myositis of left thigh 09/18/2023   Osteomyelitis of left femur (HCC) 09/18/2023   Pressure injury of left thigh, stage 4 (HCC) 09/18/2023   Pressure injury of deep tissue of left thigh 09/17/2023   Rhabdomyolysis 08/10/2023   Transaminitis 08/10/2023   CAP (community acquired pneumonia) 08/10/2023   Lactic acidosis 08/10/2023   Elevated troponin 08/10/2023   Pressure injury of skin with suspected deep tissue injury 08/10/2023   Porokeratosis 01/02/2023   Dementia due to Alzheimer's disease (HCC) 08/11/2022   Statin myopathy 04/21/2022   Pain due to onychomycosis of toenails of both feet 02/22/2022   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) 11/22/2021   Chronic pain of right hip 09/15/2021   Chronic  pain of left knee 09/15/2021   Chronic left shoulder pain 05/24/2021   Mixed hyperlipidemia 03/22/2021   Pincer nail deformity 11/03/2020   Chronic diarrhea 03/20/2019   Hypertension 08/22/2016   Normocytic anemia 08/22/2016   Major neurocognitive disorder due to multiple etiologies with behavioral disturbance (HCC) 08/22/2016   Osteoporosis 08/22/2016   Asthma, moderate persistent 09/08/2011   Diverticulosis 09/08/2011   Former smoker 09/08/2011   GERD 06/04/2007   PCP:  Brien Belvie BRAVO, MD Pharmacy:   [facility] - [place], [place] - [address] Genevia BRAVO [place] [address] Phone: [phone] Fax:  [phone]     Social Drivers of Health (SDOH) Social History: SDOH Screenings   Food Insecurity: No Food Insecurity (09/10/2024)  Housing: Low Risk  (09/10/2024)  Transportation Needs: No Transportation Needs (09/10/2024)  Utilities: Not At Risk (09/10/2024)  Alcohol Screen: Low Risk  (03/22/2023)  Depression (PHQ2-9): Low Risk  (07/13/2023)  Financial Resource Strain: Low Risk  (03/22/2023)  Physical Activity: Insufficiently Active (03/22/2023)  Social Connections: Socially Isolated (09/11/2024)  Stress: No Stress Concern Present (03/22/2023)  Tobacco Use: Medium Risk (09/10/2024)   SDOH Interventions:     Readmission Risk Interventions     No data to display

## 2024-09-11 NOTE — Progress Notes (Addendum)
 PROGRESS NOTE    Elizabeth Schwartz  FMW:[id number] DOB: 1946/05/10 DOA: 09/10/2024 PCP: Brien Belvie BRAVO, MD   Brief Narrative: Patient was brought in from skilled nursing facility with hypoxia, generalized weakness and shortness of breath and was found to be COVID-positive.  She is ambulatory at baseline but unable to ambulate due to generalized weakness.  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  Oxygen  saturation was in the 80s on room air at the nursing home.  Patient is not on oxygen  prior to admission.  She remains on high flow nasal cannula to maintain her saturation.  Assessment & Plan:   Principal Problem:   Acute hypoxemic respiratory failure due to COVID-19 [facility])   #1 acute hypoxic respiratory failure in the setting of COVID-positive chest x-ray with bilateral infiltrates.  Patient admitted with acute hypoxia 80s on room air in the ED. Chest x-ray shows mild left basilar atelectasis or infiltrates with possible small left pleural effusion. Continue Decadron , encourage incentive spirometry Out of bed as able Still on high flow nasal cannula 7 L Start Remdesvir   #2 left thigh wound seen by wound care Patient had traumatic injury to the left thigh in September 2024 developed necrotizing myositis and osteomyelitis of femur had I&D done same month and was being followed at wound care.  Wound care recommends the followingCleanse L thigh wound with Vashe, using a Q tip applicator apply silver hydrofiber Lawson #[id number] Aquacel AG) to wound bed daily and secure with silicone foam or ABD pad and tape whichever is preferred.   #3 dementia continue clozapine  Aricept  and Namenda    Estimated body mass index is 26.23 kg/m as calculated from the following:   Height as of this encounter: 5' 5 (1.651 m).   Weight as of this encounter: 71.5 kg.  DVT prophylaxis: lovenox  Code Status: dnr Family  Communication:none Disposition Plan:  Status is: Inpatient Remains inpatient appropriate because: acute illness   Consultants:  None  Procedures: None  Antimicrobials none  Subjective: She is resting in bed very drowsy on 7 L high flow, creatinine 1.46 from 1.97, white count 11.7  Objective: Vitals:   09/11/24 0301 09/11/24 0526 09/11/24 0846 09/11/24 0853  BP:  139/64    Pulse: 85 81    Resp:  18    Temp:  98.2 F (36.8 C)    TempSrc:      SpO2: 97% 99% 99% 100%  Weight:      Height:        Intake/Output Summary (Last 24 hours) at 09/11/2024 1000 Last data filed at 09/11/2024 0908 Gross per 24 hour  Intake 1408.61 ml  Output 550 ml  Net 858.61 ml   Filed Weights   09/10/24 2000  Weight: 71.5 kg    Examination:  General exam: Appears chronically ill-appearing Respiratory system: Coarse to auscultation. Respiratory effort normal. Cardiovascular system: Rate regular Gastrointestinal system: Abdomen is nondistended, soft and nontender. No organomegaly or masses felt. Normal bowel sounds heard. Central nervous system: Very drowsy  extremities: Edematous   Data Reviewed: I have personally reviewed following labs and imaging studies  CBC: Recent Labs  Lab 09/10/24 1414 09/11/24 0415  WBC 9.1 11.7*  HGB 11.4* 10.6*  HCT 35.9* 33.7*  MCV 94.0 95.2  PLT 226 232   Basic Metabolic Panel: Recent Labs  Lab 09/10/24 1414 09/10/24 1618 09/11/24 0415  NA 138 139 140  K 3.9 4.3 4.1  CL 104  104 106  CO2 20* 20* 24  GLUCOSE 245* 262* 248*  BUN 33* 34* 39*  CREATININE 1.75* 1.97* 1.46*  CALCIUM  8.7* 8.7* 8.6*   GFR: Estimated Creatinine Clearance: 31.5 mL/min (A) (by C-G formula based on SCr of 1.46 mg/dL (H)). Liver Function Tests: Recent Labs  Lab 09/10/24 1618 09/11/24 0415  AST 80* 70*  ALT 33 28  ALKPHOS 44 48  BILITOT 0.3 0.3  PROT 7.0 6.8  ALBUMIN 3.8 3.6   No results for input(s): LIPASE, AMYLASE in the last 168 hours. No results for  input(s): AMMONIA in the last 168 hours. Coagulation Profile: No results for input(s): INR, PROTIME in the last 168 hours. Cardiac Enzymes: No results for input(s): CKTOTAL, CKMB, CKMBINDEX, TROPONINI in the last 168 hours. BNP (last 3 results) Recent Labs    09/10/24 1613  PROBNP 324.0*   HbA1C: No results for input(s): HGBA1C in the last 72 hours. CBG: No results for input(s): GLUCAP in the last 168 hours. Lipid Profile: No results for input(s): CHOL, HDL, LDLCALC, TRIG, CHOLHDL, LDLDIRECT in the last 72 hours. Thyroid  Function Tests: No results for input(s): TSH, T4TOTAL, FREET4, T3FREE, THYROIDAB in the last 72 hours. Anemia Panel: No results for input(s): VITAMINB12, FOLATE, FERRITIN, TIBC, IRON , RETICCTPCT in the last 72 hours. Sepsis Labs: Recent Labs  Lab 09/10/24 1416 09/10/24 1613 09/10/24 1808 09/10/24 2114  LATICACIDVEN 1.5 2.3* 1.9 2.1*    Recent Results (from the past 240 hours)  Blood culture (routine x 2)     Status: None (Preliminary result)   Collection Time: 09/10/24  2:32 PM   Specimen: BLOOD LEFT ARM  Result Value Ref Range Status   Specimen Description   Final    BLOOD LEFT ARM Performed at [facility], [address] [place], [place] [address]    Special Requests   Final    BOTTLES DRAWN AEROBIC AND ANAEROBIC Blood Culture results may not be optimal due to an inadequate volume of blood received in culture bottles Performed at [facility], [address] [place], [place] [address]    Culture   Final    NO GROWTH < 12 HOURS Performed at [facility], [address], [place], [place] [address]    Report Status PENDING  Incomplete  Resp panel by RT-PCR (RSV, Flu A&B, Covid) Anterior Nasal Swab     Status: Abnormal   Collection Time: 09/10/24  2:49 PM   Specimen: Anterior Nasal Swab  Result Value Ref Range Status   SARS Coronavirus 2 by RT PCR POSITIVE (A)  NEGATIVE Final    Comment: (NOTE) SARS-CoV-2 target nucleic acids are DETECTED.  The SARS-CoV-2 RNA is generally detectable in upper respiratory specimens during the acute phase of infection. Positive results are indicative of the presence of the identified virus, but do not rule out bacterial infection or co-infection with other pathogens not detected by the test. Clinical correlation with patient history and other diagnostic information is necessary to determine patient infection status. The expected result is Negative.  Fact Sheet for Patients: bloggercourse.com  Fact Sheet for Healthcare Providers: seriousbroker.it  This test is not yet approved or cleared by the [place]  FDA and  has been authorized for detection and/or diagnosis of SARS-CoV-2 by FDA under an Emergency Use Authorization (EUA).  This EUA will remain in effect (meaning this test can be used) for the duration of  the COVID-19 declaration under Section 564(b)(1) of the A ct, 21 U.S.C. section  360bbb-3(b)(1), unless the authorization is terminated or revoked sooner.     Influenza A by PCR NEGATIVE NEGATIVE Final   Influenza B by PCR NEGATIVE NEGATIVE Final    Comment: (NOTE) The Xpert Xpress SARS-CoV-2/FLU/RSV plus assay is intended as an aid in the diagnosis of influenza from Nasopharyngeal swab specimens and should not be used as a sole basis for treatment. Nasal washings and aspirates are unacceptable for Xpert Xpress SARS-CoV-2/FLU/RSV testing.  Fact Sheet for Patients: bloggercourse.com  Fact Sheet for Healthcare Providers: seriousbroker.it  This test is not yet approved or cleared by the [place]  FDA and has been authorized for detection and/or diagnosis of SARS-CoV-2 by FDA under an Emergency Use Authorization (EUA). This EUA will remain in effect (meaning this test can be used) for the  duration of the COVID-19 declaration under Section 564(b)(1) of the Act, 21 U.S.C. section 360bbb-3(b)(1), unless the authorization is terminated or revoked.     Resp Syncytial Virus by PCR NEGATIVE NEGATIVE Final    Comment: (NOTE) Fact Sheet for Patients: bloggercourse.com  Fact Sheet for Healthcare Providers: seriousbroker.it  This test is not yet approved or cleared by the [place]  FDA and has been authorized for detection and/or diagnosis of SARS-CoV-2 by FDA under an Emergency Use Authorization (EUA). This EUA will remain in effect (meaning this test can be used) for the duration of the COVID-19 declaration under Section 564(b)(1) of the Act, 21 U.S.C. section 360bbb-3(b)(1), unless the authorization is terminated or revoked.  Performed at [facility], [address] [place], [place] [address]   Blood culture (routine x 2)     Status: None (Preliminary result)   Collection Time: 09/10/24  6:08 PM   Specimen: BLOOD RIGHT ARM  Result Value Ref Range Status   Specimen Description   Final    BLOOD RIGHT ARM Performed at [facility], [address], [place], [place] [address]    Special Requests   Final    BOTTLES DRAWN AEROBIC AND ANAEROBIC Blood Culture adequate volume Performed at [facility], [address] [place], [place] [address]    Culture   Final    NO GROWTH < 12 HOURS Performed at [facility], [address], [place], [place] [address]    Report Status PENDING  Incomplete  MRSA Next Gen by PCR, Nasal     Status: None   Collection Time: 09/11/24  2:00 AM   Specimen: Nasal Mucosa; Nasal Swab  Result Value Ref Range Status   MRSA by PCR Next Gen NOT DETECTED NOT DETECTED Final    Comment: (NOTE) The GeneXpert MRSA Assay (FDA approved for NASAL specimens only), is one component of a comprehensive MRSA colonization surveillance program. It is not  intended to diagnose MRSA infection nor to guide or monitor treatment for MRSA infections. Test performance is not FDA approved in patients less than 45 years old. Performed at [facility], [address], [place], [place] [address]          Radiology Studies: [facility] Chest Port 1 View Result Date: 09/10/2024 EXAM: 1 VIEW(S) XRAY OF THE CHEST 09/10/2024 02:25:17 PM COMPARISON: 09/17/2023 CLINICAL HISTORY: [id number] SOB (shortness of breath) [id number]. Per chart - Pt BIB EMS from [facility]. Pt tested + for covid yesterday and is having weakness and shob today. Pt is normally ambulatory but has not been today per facility. FINDINGS: LUNGS AND PLEURA: Mild left basilar atelectasis or infiltrate is noted. Possible small  left pleural effusion. No pneumothorax. HEART AND MEDIASTINUM: No acute abnormality of the cardiac and mediastinal silhouettes. BONES AND SOFT TISSUES: No acute osseous abnormality. Severe degenerative changes seen involving the left humeral joint. IMPRESSION: 1. Mild left basilar atelectasis or infiltrate with possible small left pleural effusion. Electronically signed by: Lynwood Seip MD 09/10/2024 02:41 PM EDT RP Workstation: [id number]    Scheduled Meds:  ascorbic acid   500 mg Oral q morning   azithromycin   250 mg Oral Daily   calcium -vitamin D   1 tablet Oral Q breakfast   dexamethasone  (DECADRON ) injection  6 mg Intravenous Q24H   donepezil   10 mg Oral QHS   enoxaparin  (LOVENOX ) injection  40 mg Subcutaneous Q24H   famotidine   20 mg Oral BID   memantine   5 mg Oral BID   multivitamin with minerals  1 tablet Oral Daily   Continuous Infusions:  cefTRIAXone  (ROCEPHIN )  IV 1 g (09/11/24 0600)   lactated ringers  Stopped (09/11/24 0618)     LOS: 1 day    Almarie KANDICE Hoots, MD  09/11/2024, 10:00 AM

## 2024-09-11 NOTE — Progress Notes (Signed)
 IV Team consulted for PIV placement, as patient had removed previous IVs.  Primary RN concerned patient will continue to remove IVs. This RN to bedside at beginning of shift for PIV start and patient noted at that time to have limited vessels suitable for PIV.   RN to message MD to see if patient can remain without IV until shortly before next mediation due (10/30 @ 1000). No IV started at this time, consult cleared. Please re-consult as needed pending MD's decision.

## 2024-09-11 NOTE — Progress Notes (Signed)
 PHARMACY - PHYSICIAN COMMUNICATION CRITICAL VALUE ALERT - BLOOD CULTURE IDENTIFICATION (BCID)  Elizabeth Schwartz is an 78 y.o. female who presented to [facility] on 09/10/2024 with a chief complaint of COVID pneumonia.    Assessment:   10/29 MRSA PCR: not detected 10/28 BCx: both bottles in one set: ngtd, both bottles in one set: GPC clusters 10/29 BCID shows Staphylococcus species  Name of physician (or Provider) Contacted: Dr Will  Current antibiotics: None  Changes to prescribed antibiotics recommended:  Recommendations accepted by provider Suspect contaminant, do not add antibiotics at this time.  Order repeat blood cultures.    Results for orders placed or performed during the hospital encounter of 09/10/24  Blood Culture ID Panel (Reflexed) (Collected: 09/10/2024  2:32 PM)  Result Value Ref Range   Enterococcus faecalis NOT DETECTED NOT DETECTED   Enterococcus Faecium NOT DETECTED NOT DETECTED   Listeria monocytogenes NOT DETECTED NOT DETECTED   Staphylococcus species DETECTED (A) NOT DETECTED   Staphylococcus aureus (BCID) NOT DETECTED NOT DETECTED   Staphylococcus epidermidis NOT DETECTED NOT DETECTED   Staphylococcus lugdunensis NOT DETECTED NOT DETECTED   Streptococcus species NOT DETECTED NOT DETECTED   Streptococcus agalactiae NOT DETECTED NOT DETECTED   Streptococcus pneumoniae NOT DETECTED NOT DETECTED   Streptococcus pyogenes NOT DETECTED NOT DETECTED   A.calcoaceticus-baumannii NOT DETECTED NOT DETECTED   Bacteroides fragilis NOT DETECTED NOT DETECTED   Enterobacterales NOT DETECTED NOT DETECTED   Enterobacter cloacae complex NOT DETECTED NOT DETECTED   Escherichia coli NOT DETECTED NOT DETECTED   Klebsiella aerogenes NOT DETECTED NOT DETECTED   Klebsiella oxytoca NOT DETECTED NOT DETECTED   Klebsiella pneumoniae NOT DETECTED NOT DETECTED   Proteus species NOT DETECTED NOT DETECTED   Salmonella species NOT DETECTED NOT DETECTED   Serratia marcescens NOT  DETECTED NOT DETECTED   Haemophilus influenzae NOT DETECTED NOT DETECTED   Neisseria meningitidis NOT DETECTED NOT DETECTED   Pseudomonas aeruginosa NOT DETECTED NOT DETECTED   Stenotrophomonas maltophilia NOT DETECTED NOT DETECTED   Candida albicans NOT DETECTED NOT DETECTED   Candida auris NOT DETECTED NOT DETECTED   Candida glabrata NOT DETECTED NOT DETECTED   Candida krusei NOT DETECTED NOT DETECTED   Candida parapsilosis NOT DETECTED NOT DETECTED   Candida tropicalis NOT DETECTED NOT DETECTED   Cryptococcus neoformans/gattii NOT DETECTED NOT DETECTED      Wanda Hasting PharmD, BCPS [facility] main pharmacy [phone] 09/11/2024 2:47 PM

## 2024-09-12 DIAGNOSIS — J9601 Acute respiratory failure with hypoxia: Secondary | ICD-10-CM | POA: Diagnosis not present

## 2024-09-12 DIAGNOSIS — U071 COVID-19: Secondary | ICD-10-CM | POA: Diagnosis not present

## 2024-09-12 LAB — GLUCOSE, CAPILLARY
Glucose-Capillary: 132 mg/dL — ABNORMAL HIGH (ref 70–99)
Glucose-Capillary: 150 mg/dL — ABNORMAL HIGH (ref 70–99)
Glucose-Capillary: 188 mg/dL — ABNORMAL HIGH (ref 70–99)
Glucose-Capillary: 242 mg/dL — ABNORMAL HIGH (ref 70–99)

## 2024-09-12 LAB — COMPREHENSIVE METABOLIC PANEL WITH GFR
ALT: 33 U/L (ref 0–44)
AST: 96 U/L — ABNORMAL HIGH (ref 15–41)
Albumin: 3.3 g/dL — ABNORMAL LOW (ref 3.5–5.0)
Alkaline Phosphatase: 39 U/L (ref 38–126)
Anion gap: 13 (ref 5–15)
BUN: 37 mg/dL — ABNORMAL HIGH (ref 8–23)
CO2: 21 mmol/L — ABNORMAL LOW (ref 22–32)
Calcium: 8.4 mg/dL — ABNORMAL LOW (ref 8.9–10.3)
Chloride: 106 mmol/L (ref 98–111)
Creatinine, Ser: 0.99 mg/dL (ref 0.44–1.00)
GFR, Estimated: 58 mL/min — ABNORMAL LOW (ref >60)
Glucose, Bld: 288 mg/dL — ABNORMAL HIGH (ref 70–99)
Potassium: 4.1 mmol/L (ref 3.5–5.1)
Sodium: 140 mmol/L (ref 135–145)
Total Bilirubin: <0.2 mg/dL (ref 0.0–1.2)
Total Protein: 6.4 g/dL — ABNORMAL LOW (ref 6.5–8.1)

## 2024-09-12 LAB — CBC
HCT: 31.8 % — ABNORMAL LOW (ref 36.0–46.0)
Hemoglobin: 9.8 g/dL — ABNORMAL LOW (ref 12.0–15.0)
MCH: 29.6 pg (ref 26.0–34.0)
MCHC: 30.8 g/dL (ref 30.0–36.0)
MCV: 96.1 fL (ref 80.0–100.0)
Platelets: 238 K/uL (ref 150–400)
RBC: 3.31 MIL/uL — ABNORMAL LOW (ref 3.87–5.11)
RDW: 14.7 % (ref 11.5–15.5)
WBC: 9.4 K/uL (ref 4.0–10.5)
nRBC: 0 % (ref 0.0–0.2)

## 2024-09-12 MED ORDER — FLUTICASONE FUROATE-VILANTEROL 200-25 MCG/ACT IN AEPB
1.0000 | INHALATION_SPRAY | Freq: Every day | RESPIRATORY_TRACT | Status: DC
  Administered 2024-09-13 – 2024-09-17 (×4): 1 via RESPIRATORY_TRACT
  Filled 2024-09-12: qty 28

## 2024-09-12 NOTE — Plan of Care (Signed)
  Problem: Education: Goal: Knowledge of General Education information will improve Description: Including pain rating scale, medication(s)/side effects and non-pharmacologic comfort measures Outcome: Progressing   Problem: Health Behavior/Discharge Planning: Goal: Ability to manage health-related needs will improve Outcome: Progressing   Problem: Clinical Measurements: Goal: Ability to maintain clinical measurements within normal limits will improve Outcome: Progressing Goal: Will remain free from infection Outcome: Progressing Goal: Diagnostic test results will improve Outcome: Progressing Goal: Respiratory complications will improve Outcome: Progressing Goal: Cardiovascular complication will be avoided Outcome: Progressing   Problem: Activity: Goal: Risk for activity intolerance will decrease Outcome: Progressing   Problem: Nutrition: Goal: Adequate nutrition will be maintained Outcome: Progressing   Problem: Coping: Goal: Level of anxiety will decrease Outcome: Progressing   Problem: Elimination: Goal: Will not experience complications related to bowel motility Outcome: Progressing Goal: Will not experience complications related to urinary retention Outcome: Progressing   Problem: Pain Managment: Goal: General experience of comfort will improve and/or be controlled Outcome: Progressing   Problem: Safety: Goal: Ability to remain free from injury will improve Outcome: Progressing   Problem: Skin Integrity: Goal: Risk for impaired skin integrity will decrease Outcome: Progressing   Problem: Education: Goal: Ability to describe self-care measures that may prevent or decrease complications (Diabetes Survival Skills Education) will improve Outcome: Progressing Goal: Individualized Educational Video(s) Outcome: Progressing   Problem: Coping: Goal: Ability to adjust to condition or change in health will improve Outcome: Progressing   Problem: Fluid  Volume: Goal: Ability to maintain a balanced intake and output will improve Outcome: Progressing   Problem: Health Behavior/Discharge Planning: Goal: Ability to identify and utilize available resources and services will improve Outcome: Progressing Goal: Ability to manage health-related needs will improve Outcome: Progressing   Problem: Metabolic: Goal: Ability to maintain appropriate glucose levels will improve Outcome: Progressing   Problem: Nutritional: Goal: Maintenance of adequate nutrition will improve Outcome: Progressing Goal: Progress toward achieving an optimal weight will improve Outcome: Progressing   Problem: Skin Integrity: Goal: Risk for impaired skin integrity will decrease Outcome: Progressing   Problem: Tissue Perfusion: Goal: Adequacy of tissue perfusion will improve Outcome: Progressing   Problem: Education: Goal: Knowledge of risk factors and measures for prevention of condition will improve Outcome: Progressing   Problem: Coping: Goal: Psychosocial and spiritual needs will be supported Outcome: Progressing   Problem: Respiratory: Goal: Will maintain a patent airway Outcome: Progressing Goal: Complications related to the disease process, condition or treatment will be avoided or minimized Outcome: Progressing

## 2024-09-12 NOTE — Progress Notes (Addendum)
 PROGRESS NOTE    Elizabeth Schwartz  FMW:[id number] DOB: Mar 16, 1946 DOA: 09/10/2024 PCP: Brien Belvie BRAVO, MD   Brief Narrative: Patient was brought in from skilled nursing facility with hypoxia, generalized weakness and shortness of breath and was found to be COVID-positive.  She is ambulatory at baseline but unable to ambulate due to generalized weakness.  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  Oxygen  saturation was in the 80s on room air at the nursing home.  Patient is not on oxygen  prior to admission.  She remains on high flow nasal cannula to maintain her saturation.  Assessment & Plan:   Principal Problem:   Acute hypoxemic respiratory failure due to COVID-19 [facility])   #1  Severe sepsis secondary to acute hypoxic respiratory failure in the setting of COVID-positive chest x-ray with bilateral infiltrates.  Patient admitted with acute hypoxia 80s on room air in the ED. patient was hypoxic tachypneic and tachycardic on admission. Chest x-ray shows mild left basilar atelectasis or infiltrates with possible small left pleural effusion. Continue Decadron , encourage incentive spirometry Out of bed as able Oxygen  saturation improved patient feels improved oxygen  requirement decreased continue remdesivir.  #2 left thigh wound seen by wound care Patient had traumatic injury to the left thigh in September 2024 developed necrotizing myositis and osteomyelitis of femur had I&D done same month and was being followed at wound care.  Wound care recommends the followingCleanse L thigh wound with Vashe, using a Q tip applicator apply silver hydrofiber Lawson #[id number] Aquacel [employer]) to wound bed daily and secure with silicone foam or ABD pad and tape whichever is preferred.   #3 dementia continue clozapine  Aricept  and Namenda    Estimated body mass index is 26.23 kg/m as calculated from the following:   Height as of  this encounter: 5' 5 (1.651 m).   Weight as of this encounter: 71.5 kg.  DVT prophylaxis: lovenox  Code Status: dnr Family Communication:none Disposition Plan:  Status is: Inpatient Remains inpatient appropriate because: acute illness   Consultants:  None  Procedures: None  Antimicrobials none  Subjective: She is wide-awake manage alert and oriented compared to yesterday she was able to answer all questions appropriately complains of diarrhea denies any vomiting occasional nausea noted however she feels her breathing has improved Objective: Vitals:   09/11/24 1100 09/11/24 1401 09/11/24 1932 09/12/24 0547  BP:  (!) 136/58 (!) 144/67 (!) 127/54  Pulse:  77 71 (!) 59  Resp: 19     Temp:  98.4 F (36.9 C)    TempSrc:  Oral    SpO2:  99% 94% 95%  Weight:      Height:        Intake/Output Summary (Last 24 hours) at 09/12/2024 1238 Last data filed at 09/12/2024 1030 Gross per 24 hour  Intake 490 ml  Output 750 ml  Net -260 ml   Filed Weights   09/10/24 2000  Weight: 71.5 kg    Examination:  General exam: Appears chronically ill-appearing Respiratory system: Coarse to auscultation. Respiratory effort normal. Cardiovascular system: Rate regular Gastrointestinal system: Abdomen is nondistended, soft and nontender. No organomegaly or masses felt. Normal bowel sounds heard. Central nervous system: Very drowsy  extremities: Edematous   Data Reviewed: I have personally reviewed following labs and imaging studies  CBC: Recent Labs  Lab 09/10/24 1414 09/11/24 0415 09/12/24 1056  WBC 9.1 11.7* 9.4  HGB 11.4* 10.6* 9.8*  HCT 35.9* 33.7* 31.8*  MCV 94.0 95.2 96.1  PLT 226 232 238   Basic Metabolic Panel: Recent Labs  Lab 09/10/24 1414 09/10/24 1618 09/11/24 0415 09/12/24 1056  NA 138 139 140 140  K 3.9 4.3 4.1 4.1  CL 104 104 106 106  CO2 20* 20* 24 21*  GLUCOSE 245* 262* 248* 288*  BUN 33* 34* 39* 37*  CREATININE 1.75* 1.97* 1.46* 0.99  CALCIUM  8.7*  8.7* 8.6* 8.4*   GFR: Estimated Creatinine Clearance: 46.4 mL/min (by C-G formula based on SCr of 0.99 mg/dL). Liver Function Tests: Recent Labs  Lab 09/10/24 1618 09/11/24 0415 09/12/24 1056  AST 80* 70* 96*  ALT 33 28 33  ALKPHOS 44 48 39  BILITOT 0.3 0.3 <0.2  PROT 7.0 6.8 6.4*  ALBUMIN 3.8 3.6 3.3*   No results for input(s): LIPASE, AMYLASE in the last 168 hours. No results for input(s): AMMONIA in the last 168 hours. Coagulation Profile: No results for input(s): INR, PROTIME in the last 168 hours. Cardiac Enzymes: No results for input(s): CKTOTAL, CKMB, CKMBINDEX, TROPONINI in the last 168 hours. BNP (last 3 results) Recent Labs    09/10/24 1613  PROBNP 324.0*   HbA1C: No results for input(s): HGBA1C in the last 72 hours. CBG: Recent Labs  Lab 09/11/24 1133 09/11/24 1746 09/11/24 2324 09/12/24 0704 09/12/24 1150  GLUCAP 174* 198* 152* 150* 242*   Lipid Profile: No results for input(s): CHOL, HDL, LDLCALC, TRIG, CHOLHDL, LDLDIRECT in the last 72 hours. Thyroid  Function Tests: No results for input(s): TSH, T4TOTAL, FREET4, T3FREE, THYROIDAB in the last 72 hours. Anemia Panel: No results for input(s): VITAMINB12, FOLATE, FERRITIN, TIBC, IRON , RETICCTPCT in the last 72 hours. Sepsis Labs: Recent Labs  Lab 09/10/24 1416 09/10/24 1613 09/10/24 1808 09/10/24 2114  LATICACIDVEN 1.5 2.3* 1.9 2.1*    Recent Results (from the past 240 hours)  Blood culture (routine x 2)     Status: Abnormal (Preliminary result)   Collection Time: 09/10/24  2:32 PM   Specimen: BLOOD LEFT ARM  Result Value Ref Range Status   Specimen Description   Final    BLOOD LEFT ARM Performed at [facility], [address], [place], [place] [address]    Special Requests   Final    BOTTLES DRAWN AEROBIC AND ANAEROBIC Blood Culture results may not be optimal due to an inadequate volume of blood received in culture  bottles Performed at [facility], [address] [place], [place] [address]    Culture  Setup Time   Final    GRAM POSITIVE COCCI IN CLUSTERS IN BOTH AEROBIC AND ANAEROBIC BOTTLES Organism ID to follow CRITICAL RESULT CALLED TO, READ BACK BY AND VERIFIED WITH: C. SHADE PHARMD, AT 1447 09/11/24 D. VANHOOK Performed at [facility], [address], [place], [place] [address]    Culture STAPHYLOCOCCUS CAPITIS (A)  Final   Report Status PENDING  Incomplete  Blood Culture ID Panel (Reflexed)     Status: Abnormal   Collection Time: 09/10/24  2:32 PM  Result Value Ref Range Status   Enterococcus faecalis NOT DETECTED NOT DETECTED Final   Enterococcus Faecium NOT DETECTED NOT DETECTED Final   Listeria monocytogenes NOT DETECTED NOT DETECTED Final   Staphylococcus species DETECTED (A) NOT DETECTED Final    Comment: CRITICAL RESULT CALLED TO, READ BACK BY AND VERIFIED WITH: C. SHADE PHARMD, AT 1447 09/11/24 D. VANHOOK    Staphylococcus aureus (BCID) NOT DETECTED NOT DETECTED Final   Staphylococcus epidermidis  NOT DETECTED NOT DETECTED Final   Staphylococcus lugdunensis NOT DETECTED NOT DETECTED Final   Streptococcus species NOT DETECTED NOT DETECTED Final   Streptococcus agalactiae NOT DETECTED NOT DETECTED Final   Streptococcus pneumoniae NOT DETECTED NOT DETECTED Final   Streptococcus pyogenes NOT DETECTED NOT DETECTED Final   A.calcoaceticus-baumannii NOT DETECTED NOT DETECTED Final   Bacteroides fragilis NOT DETECTED NOT DETECTED Final   Enterobacterales NOT DETECTED NOT DETECTED Final   Enterobacter cloacae complex NOT DETECTED NOT DETECTED Final   Escherichia coli NOT DETECTED NOT DETECTED Final   Klebsiella aerogenes NOT DETECTED NOT DETECTED Final   Klebsiella oxytoca NOT DETECTED NOT DETECTED Final   Klebsiella pneumoniae NOT DETECTED NOT DETECTED Final   Proteus species NOT DETECTED NOT DETECTED Final   Salmonella species NOT DETECTED NOT DETECTED  Final   Serratia marcescens NOT DETECTED NOT DETECTED Final   Haemophilus influenzae NOT DETECTED NOT DETECTED Final   Neisseria meningitidis NOT DETECTED NOT DETECTED Final   Pseudomonas aeruginosa NOT DETECTED NOT DETECTED Final   Stenotrophomonas maltophilia NOT DETECTED NOT DETECTED Final   Candida albicans NOT DETECTED NOT DETECTED Final   Candida auris NOT DETECTED NOT DETECTED Final   Candida glabrata NOT DETECTED NOT DETECTED Final   Candida krusei NOT DETECTED NOT DETECTED Final   Candida parapsilosis NOT DETECTED NOT DETECTED Final   Candida tropicalis NOT DETECTED NOT DETECTED Final   Cryptococcus neoformans/gattii NOT DETECTED NOT DETECTED Final    Comment: Performed at [facility], [address] [place], [place] [address]  Resp panel by RT-PCR (RSV, Flu A&B, Covid) Anterior Nasal Swab     Status: Abnormal   Collection Time: 09/10/24  2:49 PM   Specimen: Anterior Nasal Swab  Result Value Ref Range Status   SARS Coronavirus 2 by RT PCR POSITIVE (A) NEGATIVE Final    Comment: (NOTE) SARS-CoV-2 target nucleic acids are DETECTED.  The SARS-CoV-2 RNA is generally detectable in upper respiratory specimens during the acute phase of infection. Positive results are indicative of the presence of the identified virus, but do not rule out bacterial infection or co-infection with other pathogens not detected by the test. Clinical correlation with patient history and other diagnostic information is necessary to determine patient infection status. The expected result is Negative.  Fact Sheet for Patients: bloggercourse.com  Fact Sheet for Healthcare Providers: seriousbroker.it  This test is not yet approved or cleared by the [place]  FDA and  has been authorized for detection and/or diagnosis of SARS-CoV-2 by FDA under an Emergency Use Authorization (EUA).  This EUA will remain in effect (meaning this test can be used)  for the duration of  the COVID-19 declaration under Section 564(b)(1) of the A ct, 21 U.S.C. section 360bbb-3(b)(1), unless the authorization is terminated or revoked sooner.     Influenza A by PCR NEGATIVE NEGATIVE Final   Influenza B by PCR NEGATIVE NEGATIVE Final    Comment: (NOTE) The Xpert Xpress SARS-CoV-2/FLU/RSV plus assay is intended as an aid in the diagnosis of influenza from Nasopharyngeal swab specimens and should not be used as a sole basis for treatment. Nasal washings and aspirates are unacceptable for Xpert Xpress SARS-CoV-2/FLU/RSV testing.  Fact Sheet for Patients: bloggercourse.com  Fact Sheet for Healthcare Providers: seriousbroker.it  This test is not yet approved or cleared by the [place]  FDA and has been authorized for detection and/or diagnosis of SARS-CoV-2 by FDA under an Emergency Use Authorization (EUA). This EUA will remain in effect (meaning this test  can be used) for the duration of the COVID-19 declaration under Section 564(b)(1) of the Act, 21 U.S.C. section 360bbb-3(b)(1), unless the authorization is terminated or revoked.     Resp Syncytial Virus by PCR NEGATIVE NEGATIVE Final    Comment: (NOTE) Fact Sheet for Patients: bloggercourse.com  Fact Sheet for Healthcare Providers: seriousbroker.it  This test is not yet approved or cleared by the [place]  FDA and has been authorized for detection and/or diagnosis of SARS-CoV-2 by FDA under an Emergency Use Authorization (EUA). This EUA will remain in effect (meaning this test can be used) for the duration of the COVID-19 declaration under Section 564(b)(1) of the Act, 21 U.S.C. section 360bbb-3(b)(1), unless the authorization is terminated or revoked.  Performed at [facility], [address] [place], [place] [address]   Blood culture (routine x 2)     Status:  None (Preliminary result)   Collection Time: 09/10/24  6:08 PM   Specimen: BLOOD RIGHT ARM  Result Value Ref Range Status   Specimen Description   Final    BLOOD RIGHT ARM Performed at [facility], [address] [place], [place] [address]    Special Requests   Final    BOTTLES DRAWN AEROBIC AND ANAEROBIC Blood Culture adequate volume Performed at [facility], [address], [place], [place] [address]    Culture   Final    NO GROWTH 2 DAYS Performed at [facility], [address] [place], [place] [address]    Report Status PENDING  Incomplete  MRSA Next Gen by PCR, Nasal     Status: None   Collection Time: 09/11/24  2:00 AM   Specimen: Nasal Mucosa; Nasal Swab  Result Value Ref Range Status   MRSA by PCR Next Gen NOT DETECTED NOT DETECTED Final    Comment: (NOTE) The GeneXpert MRSA Assay (FDA approved for NASAL specimens only), is one component of a comprehensive MRSA colonization surveillance program. It is not intended to diagnose MRSA infection nor to guide or monitor treatment for MRSA infections. Test performance is not FDA approved in patients less than 62 years old. Performed at [facility], [address] [place], [place] [address]   Culture, blood (Routine X 2) w Reflex to ID Panel     Status: None (Preliminary result)   Collection Time: 09/11/24  6:46 PM   Specimen: BLOOD  Result Value Ref Range Status   Specimen Description   Final    BLOOD BLOOD LEFT HAND Performed at [facility], [address] [place], [place] [address]    Special Requests   Final    BOTTLES DRAWN AEROBIC ONLY Blood Culture results may not be optimal due to an inadequate volume of blood received in culture bottles Performed at [facility], [address] [place] [address], [place], [place] [address]    Culture   Final    NO GROWTH < 12 HOURS Performed at [facility], [address] [place], [place]  [address]    Report Status PENDING  Incomplete         Radiology Studies: DG Chest Port 1 View Result Date: 09/10/2024 EXAM: 1 VIEW(S) XRAY OF THE CHEST 09/10/2024 02:25:17 PM COMPARISON: 09/17/2023 CLINICAL HISTORY: [id number] SOB (shortness of breath) [id number]. Per chart - Pt BIB EMS from [facility]. Pt tested + for covid yesterday and is having weakness and shob today. Pt is normally ambulatory but has not been today per  facility. FINDINGS: LUNGS AND PLEURA: Mild left basilar atelectasis or infiltrate is noted. Possible small left pleural effusion. No pneumothorax. HEART AND MEDIASTINUM: No acute abnormality of the cardiac and mediastinal silhouettes. BONES AND SOFT TISSUES: No acute osseous abnormality. Severe degenerative changes seen involving the left humeral joint. IMPRESSION: 1. Mild left basilar atelectasis or infiltrate with possible small left pleural effusion. Electronically signed by: Lynwood Seip MD 09/10/2024 02:41 PM EDT RP Workstation: [id number]    Scheduled Meds:  calcium -vitamin D   1 tablet Oral Q breakfast   cloZAPine   200 mg Oral QHS   dexamethasone  (DECADRON ) injection  6 mg Intravenous Q24H   donepezil   10 mg Oral QHS   enoxaparin  (LOVENOX ) injection  40 mg Subcutaneous Q24H   famotidine   20 mg Oral Daily   insulin  aspart  0-9 Units Subcutaneous Q6H   memantine   5 mg Oral BID   multivitamin with minerals  1 tablet Oral Daily   Continuous Infusions:  remdesivir 100 mg in sodium chloride  0.9 % 100 mL IVPB       LOS: 2 days    Almarie KANDICE Hoots, MD  09/12/2024, 12:38 PM

## 2024-09-12 NOTE — Plan of Care (Signed)
  Problem: Clinical Measurements: Goal: Ability to maintain clinical measurements within normal limits will improve Outcome: Progressing Goal: Diagnostic test results will improve Outcome: Progressing Goal: Respiratory complications will improve Outcome: Progressing Goal: Cardiovascular complication will be avoided Outcome: Progressing   Problem: Safety: Goal: Ability to remain free from injury will improve Outcome: Progressing   Problem: Metabolic: Goal: Ability to maintain appropriate glucose levels will improve Outcome: Progressing   Problem: Skin Integrity: Goal: Risk for impaired skin integrity will decrease Outcome: Progressing

## 2024-09-13 DIAGNOSIS — U071 COVID-19: Secondary | ICD-10-CM | POA: Diagnosis not present

## 2024-09-13 DIAGNOSIS — J9601 Acute respiratory failure with hypoxia: Secondary | ICD-10-CM | POA: Diagnosis not present

## 2024-09-13 LAB — GLUCOSE, CAPILLARY
Glucose-Capillary: 119 mg/dL — ABNORMAL HIGH (ref 70–99)
Glucose-Capillary: 142 mg/dL — ABNORMAL HIGH (ref 70–99)
Glucose-Capillary: 287 mg/dL — ABNORMAL HIGH (ref 70–99)

## 2024-09-13 LAB — CBC WITH DIFFERENTIAL/PLATELET
Abs Immature Granulocytes: 0.17 K/uL — ABNORMAL HIGH (ref 0.00–0.07)
Basophils Absolute: 0 K/uL (ref 0.0–0.1)
Basophils Relative: 0 %
Eosinophils Absolute: 0 K/uL (ref 0.0–0.5)
Eosinophils Relative: 0 %
HCT: 33 % — ABNORMAL LOW (ref 36.0–46.0)
Hemoglobin: 10.2 g/dL — ABNORMAL LOW (ref 12.0–15.0)
Immature Granulocytes: 2 %
Lymphocytes Relative: 14 %
Lymphs Abs: 1.1 K/uL (ref 0.7–4.0)
MCH: 29.6 pg (ref 26.0–34.0)
MCHC: 30.9 g/dL (ref 30.0–36.0)
MCV: 95.7 fL (ref 80.0–100.0)
Monocytes Absolute: 0.6 K/uL (ref 0.1–1.0)
Monocytes Relative: 8 %
Neutro Abs: 6.1 K/uL (ref 1.7–7.7)
Neutrophils Relative %: 76 %
Platelets: 271 K/uL (ref 150–400)
RBC: 3.45 MIL/uL — ABNORMAL LOW (ref 3.87–5.11)
RDW: 14.8 % (ref 11.5–15.5)
WBC: 8 K/uL (ref 4.0–10.5)
nRBC: 0 % (ref 0.0–0.2)

## 2024-09-13 LAB — COMPREHENSIVE METABOLIC PANEL WITH GFR
ALT: 30 U/L (ref 0–44)
AST: 61 U/L — ABNORMAL HIGH (ref 15–41)
Albumin: 3.4 g/dL — ABNORMAL LOW (ref 3.5–5.0)
Alkaline Phosphatase: 48 U/L (ref 38–126)
Anion gap: 13 (ref 5–15)
BUN: 37 mg/dL — ABNORMAL HIGH (ref 8–23)
CO2: 24 mmol/L (ref 22–32)
Calcium: 9 mg/dL (ref 8.9–10.3)
Chloride: 109 mmol/L (ref 98–111)
Creatinine, Ser: 0.9 mg/dL (ref 0.44–1.00)
GFR, Estimated: >60 mL/min (ref >60)
Glucose, Bld: 145 mg/dL — ABNORMAL HIGH (ref 70–99)
Potassium: 3.9 mmol/L (ref 3.5–5.1)
Sodium: 146 mmol/L — ABNORMAL HIGH (ref 135–145)
Total Bilirubin: 0.2 mg/dL (ref 0.0–1.2)
Total Protein: 6.2 g/dL — ABNORMAL LOW (ref 6.5–8.1)

## 2024-09-13 NOTE — Plan of Care (Signed)
  Problem: Clinical Measurements: Goal: Diagnostic test results will improve Outcome: Progressing Goal: Respiratory complications will improve Outcome: Progressing Goal: Cardiovascular complication will be avoided Outcome: Progressing   Problem: Safety: Goal: Ability to remain free from injury will improve Outcome: Progressing   Problem: Skin Integrity: Goal: Risk for impaired skin integrity will decrease Outcome: Progressing   Problem: Metabolic: Goal: Ability to maintain appropriate glucose levels will improve Outcome: Progressing

## 2024-09-13 NOTE — Progress Notes (Signed)
 PROGRESS NOTE    Elizabeth Schwartz  FMW:[id number] DOB: 06/15/1946 DOA: 09/10/2024 PCP: Brien Belvie BRAVO, MD   Brief Narrative: Patient was brought in from skilled nursing facility with hypoxia, generalized weakness and shortness of breath and was found to be COVID-positive.  She is ambulatory at baseline but unable to ambulate due to generalized weakness.  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  Oxygen  saturation was in the 80s on room air at the nursing home.  Patient is not on oxygen  prior to admission.  She remains on high flow nasal cannula to maintain her saturation.  Assessment & Plan:   Principal Problem:   Acute hypoxemic respiratory failure due to COVID-19 [facility])   #1  Severe sepsis secondary to acute hypoxic respiratory failure in the setting of COVID-positive chest x-ray with bilateral infiltrates.  Patient admitted with acute hypoxia 80s on room air in the ED. patient was hypoxic tachypneic and tachycardic on admission. Chest x-ray shows mild left basilar atelectasis or infiltrates with possible small left pleural effusion. Continue Decadron , encourage incentive spirometry Out of bed as able Oxygen  saturation improved patient feels improved oxygen  requirement decreased continue remdesivir. Overall oxygen  saturation improved diarrhea improved though still having some no events overnight.  #2 left thigh wound seen by wound care Patient had traumatic injury to the left thigh in September 2024 developed necrotizing myositis and osteomyelitis of femur had I&D done same month and was being followed at wound care.  Wound care recommends the followingCleanse L thigh wound with Vashe, using a Q tip applicator apply silver hydrofiber Lawson #[id number] Aquacel AG) to wound bed daily and secure with silicone foam or ABD pad and tape whichever is preferred.   #3 dementia continue clozapine  Aricept  and  Namenda    Estimated body mass index is 26.23 kg/m as calculated from the following:   Height as of this encounter: 5' 5 (1.651 m).   Weight as of this encounter: 71.5 kg.  DVT prophylaxis: lovenox  Code Status: dnr Family Communication:none Disposition Plan:  Status is: Inpatient Remains inpatient appropriate because: acute illness   Consultants:  None  Procedures: None  Antimicrobials none  Subjective: No new events overnight still has some diarrhea no nausea vomiting Objective: Vitals:   09/12/24 1414 09/12/24 2043 09/13/24 0438 09/13/24 0857  BP: (!) 132/57 (!) 133/59 (!) 136/56   Pulse: 67 70 65   Resp:  15 19   Temp: 98.8 F (37.1 C)  98.2 F (36.8 C)   TempSrc: Oral  Oral   SpO2: 94% 95% 94% 100%  Weight:      Height:        Intake/Output Summary (Last 24 hours) at 09/13/2024 1258 Last data filed at 09/13/2024 1100 Gross per 24 hour  Intake 480 ml  Output --  Net 480 ml   Filed Weights   09/10/24 2000  Weight: 71.5 kg    Examination:  General exam: Appears chronically ill-appearing Respiratory system: Coarse to auscultation. Respiratory effort normal. Cardiovascular system: Rate regular Gastrointestinal system: Abdomen is nondistended, soft and nontender. No organomegaly or masses felt. Normal bowel sounds heard. Central nervous system: Very drowsy  extremities: Edematous   Data Reviewed: I have personally reviewed following labs and imaging studies  CBC: Recent Labs  Lab 09/10/24 1414 09/11/24 0415 09/12/24 1056 09/13/24 0415  WBC 9.1 11.7* 9.4 8.0  NEUTROABS  --   --   --  6.1  HGB 11.4* 10.6*  9.8* 10.2*  HCT 35.9* 33.7* 31.8* 33.0*  MCV 94.0 95.2 96.1 95.7  PLT 226 232 238 271   Basic Metabolic Panel: Recent Labs  Lab 09/10/24 1414 09/10/24 1618 09/11/24 0415 09/12/24 1056 09/13/24 0415  NA 138 139 140 140 146*  K 3.9 4.3 4.1 4.1 3.9  CL 104 104 106 106 109  CO2 20* 20* 24 21* 24  GLUCOSE 245* 262* 248* 288* 145*  BUN  33* 34* 39* 37* 37*  CREATININE 1.75* 1.97* 1.46* 0.99 0.90  CALCIUM  8.7* 8.7* 8.6* 8.4* 9.0   GFR: Estimated Creatinine Clearance: 51.1 mL/min (by C-G formula based on SCr of 0.9 mg/dL). Liver Function Tests: Recent Labs  Lab 09/10/24 1618 09/11/24 0415 09/12/24 1056 09/13/24 0415  AST 80* 70* 96* 61*  ALT 33 28 33 30  ALKPHOS 44 48 39 48  BILITOT 0.3 0.3 <0.2 0.2  PROT 7.0 6.8 6.4* 6.2*  ALBUMIN 3.8 3.6 3.3* 3.4*   No results for input(s): LIPASE, AMYLASE in the last 168 hours. No results for input(s): AMMONIA in the last 168 hours. Coagulation Profile: No results for input(s): INR, PROTIME in the last 168 hours. Cardiac Enzymes: No results for input(s): CKTOTAL, CKMB, CKMBINDEX, TROPONINI in the last 168 hours. BNP (last 3 results) Recent Labs    09/10/24 1613  PROBNP 324.0*   HbA1C: No results for input(s): HGBA1C in the last 72 hours. CBG: Recent Labs  Lab 09/12/24 1150 09/12/24 1743 09/12/24 2346 09/13/24 0643 09/13/24 1145  GLUCAP 242* 132* 188* 119* 142*   Lipid Profile: No results for input(s): CHOL, HDL, LDLCALC, TRIG, CHOLHDL, LDLDIRECT in the last 72 hours. Thyroid  Function Tests: No results for input(s): TSH, T4TOTAL, FREET4, T3FREE, THYROIDAB in the last 72 hours. Anemia Panel: No results for input(s): VITAMINB12, FOLATE, FERRITIN, TIBC, IRON , RETICCTPCT in the last 72 hours. Sepsis Labs: Recent Labs  Lab 09/10/24 1416 09/10/24 1613 09/10/24 1808 09/10/24 2114  LATICACIDVEN 1.5 2.3* 1.9 2.1*    Recent Results (from the past 240 hours)  Blood culture (routine x 2)     Status: Abnormal (Preliminary result)   Collection Time: 09/10/24  2:32 PM   Specimen: BLOOD LEFT ARM  Result Value Ref Range Status   Specimen Description   Final    BLOOD LEFT ARM Performed at [facility], [address], [place], [place] [address]    Special Requests   Final    BOTTLES DRAWN AEROBIC AND  ANAEROBIC Blood Culture results may not be optimal due to an inadequate volume of blood received in culture bottles Performed at [facility], [address], [place], [place] [address]    Culture  Setup Time   Final    GRAM POSITIVE COCCI IN CLUSTERS IN BOTH AEROBIC AND ANAEROBIC BOTTLES Organism ID to follow CRITICAL RESULT CALLED TO, READ BACK BY AND VERIFIED WITH: C. SHADE PHARMD, AT 1447 09/11/24 D. VANHOOK    Culture (A)  Final    STAPHYLOCOCCUS CAPITIS SUSCEPTIBILITIES PERFORMED ON PREVIOUS CULTURE WITHIN THE LAST 5 DAYS. STAPHYLOCOCCUS HOMINIS SUSCEPTIBILITIES TO FOLLOW Performed at [facility], [address], [place], [place] [address]    Report Status PENDING  Incomplete  Blood Culture ID Panel (Reflexed)     Status: Abnormal   Collection Time: 09/10/24  2:32 PM  Result Value Ref Range Status   Enterococcus faecalis NOT DETECTED NOT DETECTED Final   Enterococcus Faecium NOT DETECTED NOT DETECTED Final   Listeria monocytogenes NOT DETECTED NOT  DETECTED Final   Staphylococcus species DETECTED (A) NOT DETECTED Final    Comment: CRITICAL RESULT CALLED TO, READ BACK BY AND VERIFIED WITH: C. SHADE PHARMD, AT 1447 09/11/24 D. VANHOOK    Staphylococcus aureus (BCID) NOT DETECTED NOT DETECTED Final   Staphylococcus epidermidis NOT DETECTED NOT DETECTED Final   Staphylococcus lugdunensis NOT DETECTED NOT DETECTED Final   Streptococcus species NOT DETECTED NOT DETECTED Final   Streptococcus agalactiae NOT DETECTED NOT DETECTED Final   Streptococcus pneumoniae NOT DETECTED NOT DETECTED Final   Streptococcus pyogenes NOT DETECTED NOT DETECTED Final   A.calcoaceticus-baumannii NOT DETECTED NOT DETECTED Final   Bacteroides fragilis NOT DETECTED NOT DETECTED Final   Enterobacterales NOT DETECTED NOT DETECTED Final   Enterobacter cloacae complex NOT DETECTED NOT DETECTED Final   Escherichia coli NOT DETECTED NOT DETECTED Final   Klebsiella aerogenes NOT  DETECTED NOT DETECTED Final   Klebsiella oxytoca NOT DETECTED NOT DETECTED Final   Klebsiella pneumoniae NOT DETECTED NOT DETECTED Final   Proteus species NOT DETECTED NOT DETECTED Final   Salmonella species NOT DETECTED NOT DETECTED Final   Serratia marcescens NOT DETECTED NOT DETECTED Final   Haemophilus influenzae NOT DETECTED NOT DETECTED Final   Neisseria meningitidis NOT DETECTED NOT DETECTED Final   Pseudomonas aeruginosa NOT DETECTED NOT DETECTED Final   Stenotrophomonas maltophilia NOT DETECTED NOT DETECTED Final   Candida albicans NOT DETECTED NOT DETECTED Final   Candida auris NOT DETECTED NOT DETECTED Final   Candida glabrata NOT DETECTED NOT DETECTED Final   Candida krusei NOT DETECTED NOT DETECTED Final   Candida parapsilosis NOT DETECTED NOT DETECTED Final   Candida tropicalis NOT DETECTED NOT DETECTED Final   Cryptococcus neoformans/gattii NOT DETECTED NOT DETECTED Final    Comment: Performed at [facility], [address], [place], [place] [address]  Resp panel by RT-PCR (RSV, Flu A&B, Covid) Anterior Nasal Swab     Status: Abnormal   Collection Time: 09/10/24  2:49 PM   Specimen: Anterior Nasal Swab  Result Value Ref Range Status   SARS Coronavirus 2 by RT PCR POSITIVE (A) NEGATIVE Final    Comment: (NOTE) SARS-CoV-2 target nucleic acids are DETECTED.  The SARS-CoV-2 RNA is generally detectable in upper respiratory specimens during the acute phase of infection. Positive results are indicative of the presence of the identified virus, but do not rule out bacterial infection or co-infection with other pathogens not detected by the test. Clinical correlation with patient history and other diagnostic information is necessary to determine patient infection status. The expected result is Negative.  Fact Sheet for Patients: bloggercourse.com  Fact Sheet for Healthcare Providers: seriousbroker.it  This test is  not yet approved or cleared by the [place]  FDA and  has been authorized for detection and/or diagnosis of SARS-CoV-2 by FDA under an Emergency Use Authorization (EUA).  This EUA will remain in effect (meaning this test can be used) for the duration of  the COVID-19 declaration under Section 564(b)(1) of the A ct, 21 U.S.C. section 360bbb-3(b)(1), unless the authorization is terminated or revoked sooner.     Influenza A by PCR NEGATIVE NEGATIVE Final   Influenza B by PCR NEGATIVE NEGATIVE Final    Comment: (NOTE) The Xpert Xpress SARS-CoV-2/FLU/RSV plus assay is intended as an aid in the diagnosis of influenza from Nasopharyngeal swab specimens and should not be used as a sole basis for treatment. Nasal washings and aspirates are unacceptable for Xpert Xpress SARS-CoV-2/FLU/RSV testing.  Fact Sheet for Patients: bloggercourse.com  Fact Sheet for Healthcare Providers: seriousbroker.it  This test is not yet approved or cleared by the [place]  FDA and has been authorized for detection and/or diagnosis of SARS-CoV-2 by FDA under an Emergency Use Authorization (EUA). This EUA will remain in effect (meaning this test can be used) for the duration of the COVID-19 declaration under Section 564(b)(1) of the Act, 21 U.S.C. section 360bbb-3(b)(1), unless the authorization is terminated or revoked.     Resp Syncytial Virus by PCR NEGATIVE NEGATIVE Final    Comment: (NOTE) Fact Sheet for Patients: bloggercourse.com  Fact Sheet for Healthcare Providers: seriousbroker.it  This test is not yet approved or cleared by the [place]  FDA and has been authorized for detection and/or diagnosis of SARS-CoV-2 by FDA under an Emergency Use Authorization (EUA). This EUA will remain in effect (meaning this test can be used) for the duration of the COVID-19 declaration under Section  564(b)(1) of the Act, 21 U.S.C. section 360bbb-3(b)(1), unless the authorization is terminated or revoked.  Performed at [facility], [address] [place], [place] [address]   Blood culture (routine x 2)     Status: None (Preliminary result)   Collection Time: 09/10/24  6:08 PM   Specimen: BLOOD RIGHT ARM  Result Value Ref Range Status   Specimen Description   Final    BLOOD RIGHT ARM Performed at [facility], [address] [place], [place] [address]    Special Requests   Final    BOTTLES DRAWN AEROBIC AND ANAEROBIC Blood Culture adequate volume Performed at [facility], [address] [place], [place] [address]    Culture   Final    NO GROWTH 3 DAYS Performed at [facility], [address], [place], [place] [address]    Report Status PENDING  Incomplete  MRSA Next Gen by PCR, Nasal     Status: None   Collection Time: 09/11/24  2:00 AM   Specimen: Nasal Mucosa; Nasal Swab  Result Value Ref Range Status   MRSA by PCR Next Gen NOT DETECTED NOT DETECTED Final    Comment: (NOTE) The GeneXpert MRSA Assay (FDA approved for NASAL specimens only), is one component of a comprehensive MRSA colonization surveillance program. It is not intended to diagnose MRSA infection nor to guide or monitor treatment for MRSA infections. Test performance is not FDA approved in patients less than 74 years old. Performed at [facility], [address], [place], [place] [address]   Culture, blood (Routine X 2) w Reflex to ID Panel     Status: Abnormal (Preliminary result)   Collection Time: 09/11/24  6:46 PM   Specimen: BLOOD LEFT HAND  Result Value Ref Range Status   Specimen Description   Final    BLOOD LEFT HAND Performed at [facility], [address], [place], [place] [address]    Special Requests   Final    BOTTLES DRAWN AEROBIC ONLY Blood Culture results may not be optimal due to an inadequate volume of  blood received in culture bottles Performed at [facility], [address] [place], [place] [address]    Culture  Setup Time   Final    GRAM POSITIVE COCCI IN CLUSTERS BOTTLES DRAWN AEROBIC ONLY CRITICAL VALUE NOTED.  VALUE IS CONSISTENT WITH PREVIOUSLY REPORTED AND CALLED VALUE. Performed at [facility], [address], [place], [place] [address]    Culture STAPHYLOCOCCUS HOMINIS (A)  Final  Report Status PENDING  Incomplete  Culture, blood (Routine X 2) w Reflex to ID Panel     Status: None (Preliminary result)   Collection Time: 09/12/24 12:42 AM   Specimen: BLOOD  Result Value Ref Range Status   Specimen Description   Final    BLOOD RIGHT ANTECUBITAL Performed at [facility], [address], [place], [place] [address]    Special Requests   Final    Blood Culture adequate volume BOTTLES DRAWN AEROBIC ONLY Performed at [facility], [address], [place], [place] [address]    Culture   Final    NO GROWTH 1 DAY Performed at [facility], [address] [place], [place] [address]    Report Status PENDING  Incomplete         Radiology Studies: No results found.   Scheduled Meds:  calcium -vitamin D   1 tablet Oral Q breakfast   cloZAPine   200 mg Oral QHS   dexamethasone  (DECADRON ) injection  6 mg Intravenous Q24H   donepezil   10 mg Oral QHS   enoxaparin  (LOVENOX ) injection  40 mg Subcutaneous Q24H   famotidine   20 mg Oral Daily   fluticasone furoate-vilanterol  1 puff Inhalation Daily   insulin  aspart  0-9 Units Subcutaneous Q6H   memantine   5 mg Oral BID   multivitamin with minerals  1 tablet Oral Daily   Continuous Infusions:  remdesivir 100 mg in sodium chloride  0.9 % 100 mL IVPB 100 mg (09/12/24 1411)     LOS: 3 days    Almarie KANDICE Hoots, MD  09/13/2024, 12:58 PM

## 2024-09-13 NOTE — Plan of Care (Signed)
 Problem: Education: Goal: Knowledge of General Education information will improve Description: Including pain rating scale, medication(s)/side effects and non-pharmacologic comfort measures 09/13/2024 1702 by Cecilie Myla SAUNDERS, RN Outcome: Progressing 09/13/2024 1702 by Cecilie Myla SAUNDERS, RN Outcome: Progressing   Problem: Health Behavior/Discharge Planning: Goal: Ability to manage health-related needs will improve 09/13/2024 1702 by Cecilie Myla SAUNDERS, RN Outcome: Progressing 09/13/2024 1702 by Cecilie Myla SAUNDERS, RN Outcome: Progressing   Problem: Clinical Measurements: Goal: Ability to maintain clinical measurements within normal limits will improve 09/13/2024 1702 by Cecilie Myla SAUNDERS, RN Outcome: Progressing 09/13/2024 1702 by Cecilie Myla SAUNDERS, RN Outcome: Progressing Goal: Will remain free from infection 09/13/2024 1702 by Cecilie Myla SAUNDERS, RN Outcome: Progressing 09/13/2024 1702 by Cecilie Myla SAUNDERS, RN Outcome: Progressing Goal: Diagnostic test results will improve 09/13/2024 1702 by Cecilie Myla SAUNDERS, RN Outcome: Progressing 09/13/2024 1702 by Cecilie Myla SAUNDERS, RN Outcome: Progressing Goal: Respiratory complications will improve 09/13/2024 1702 by Cecilie Myla SAUNDERS, RN Outcome: Progressing 09/13/2024 1702 by Cecilie Myla SAUNDERS, RN Outcome: Progressing Goal: Cardiovascular complication will be avoided 09/13/2024 1702 by Cecilie Myla SAUNDERS, RN Outcome: Progressing 09/13/2024 1702 by Cecilie Myla SAUNDERS, RN Outcome: Progressing   Problem: Activity: Goal: Risk for activity intolerance will decrease 09/13/2024 1702 by Cecilie Myla SAUNDERS, RN Outcome: Progressing 09/13/2024 1702 by Cecilie Myla SAUNDERS, RN Outcome: Progressing   Problem: Nutrition: Goal: Adequate nutrition will be maintained 09/13/2024 1702 by Cecilie Myla SAUNDERS, RN Outcome: Progressing 09/13/2024 1702 by Cecilie Myla SAUNDERS, RN Outcome: Progressing   Problem: Coping: Goal: Level of anxiety will decrease 09/13/2024 1702 by Cecilie Myla SAUNDERS, RN Outcome:  Progressing 09/13/2024 1702 by Cecilie Myla SAUNDERS, RN Outcome: Progressing   Problem: Elimination: Goal: Will not experience complications related to bowel motility 09/13/2024 1702 by Cecilie Myla SAUNDERS, RN Outcome: Progressing 09/13/2024 1702 by Cecilie Myla SAUNDERS, RN Outcome: Progressing Goal: Will not experience complications related to urinary retention 09/13/2024 1702 by Cecilie Myla SAUNDERS, RN Outcome: Progressing 09/13/2024 1702 by Cecilie Myla SAUNDERS, RN Outcome: Progressing   Problem: Pain Managment: Goal: General experience of comfort will improve and/or be controlled 09/13/2024 1702 by Cecilie Myla SAUNDERS, RN Outcome: Progressing 09/13/2024 1702 by Cecilie Myla SAUNDERS, RN Outcome: Progressing   Problem: Safety: Goal: Ability to remain free from injury will improve 09/13/2024 1702 by Cecilie Myla SAUNDERS, RN Outcome: Progressing 09/13/2024 1702 by Cecilie Myla SAUNDERS, RN Outcome: Progressing   Problem: Skin Integrity: Goal: Risk for impaired skin integrity will decrease 09/13/2024 1702 by Cecilie Myla SAUNDERS, RN Outcome: Progressing 09/13/2024 1702 by Cecilie Myla SAUNDERS, RN Outcome: Progressing   Problem: Education: Goal: Ability to describe self-care measures that may prevent or decrease complications (Diabetes Survival Skills Education) will improve 09/13/2024 1702 by Cecilie Myla SAUNDERS, RN Outcome: Progressing 09/13/2024 1702 by Cecilie Myla SAUNDERS, RN Outcome: Progressing Goal: Individualized Educational Video(s) 09/13/2024 1702 by Cecilie Myla SAUNDERS, RN Outcome: Progressing 09/13/2024 1702 by Cecilie Myla SAUNDERS, RN Outcome: Progressing   Problem: Coping: Goal: Ability to adjust to condition or change in health will improve 09/13/2024 1702 by Cecilie Myla SAUNDERS, RN Outcome: Progressing 09/13/2024 1702 by Cecilie Myla SAUNDERS, RN Outcome: Progressing   Problem: Fluid Volume: Goal: Ability to maintain a balanced intake and output will improve 09/13/2024 1702 by Cecilie Myla SAUNDERS, RN Outcome: Progressing 09/13/2024 1702 by  Cecilie Myla SAUNDERS, RN Outcome: Progressing   Problem: Health Behavior/Discharge Planning: Goal: Ability to identify and utilize available resources and services will improve 09/13/2024 1702 by Cecilie Myla SAUNDERS, RN Outcome: Progressing 09/13/2024 1702 by  Cecilie Myla SAUNDERS, RN Outcome: Progressing Goal: Ability to manage health-related needs will improve 09/13/2024 1702 by Cecilie Myla SAUNDERS, RN Outcome: Progressing 09/13/2024 1702 by Cecilie Myla SAUNDERS, RN Outcome: Progressing   Problem: Metabolic: Goal: Ability to maintain appropriate glucose levels will improve 09/13/2024 1702 by Cecilie Myla SAUNDERS, RN Outcome: Progressing 09/13/2024 1702 by Cecilie Myla SAUNDERS, RN Outcome: Progressing   Problem: Nutritional: Goal: Maintenance of adequate nutrition will improve 09/13/2024 1702 by Cecilie Myla SAUNDERS, RN Outcome: Progressing 09/13/2024 1702 by Cecilie Myla SAUNDERS, RN Outcome: Progressing Goal: Progress toward achieving an optimal weight will improve 09/13/2024 1702 by Cecilie Myla SAUNDERS, RN Outcome: Progressing 09/13/2024 1702 by Cecilie Myla SAUNDERS, RN Outcome: Progressing   Problem: Skin Integrity: Goal: Risk for impaired skin integrity will decrease 09/13/2024 1702 by Cecilie Myla SAUNDERS, RN Outcome: Progressing 09/13/2024 1702 by Cecilie Myla SAUNDERS, RN Outcome: Progressing   Problem: Tissue Perfusion: Goal: Adequacy of tissue perfusion will improve 09/13/2024 1702 by Cecilie Myla SAUNDERS, RN Outcome: Progressing 09/13/2024 1702 by Cecilie Myla SAUNDERS, RN Outcome: Progressing   Problem: Education: Goal: Knowledge of risk factors and measures for prevention of condition will improve 09/13/2024 1702 by Cecilie Myla SAUNDERS, RN Outcome: Progressing 09/13/2024 1702 by Cecilie Myla SAUNDERS, RN Outcome: Progressing   Problem: Coping: Goal: Psychosocial and spiritual needs will be supported 09/13/2024 1702 by Cecilie Myla SAUNDERS, RN Outcome: Progressing 09/13/2024 1702 by Cecilie Myla SAUNDERS, RN Outcome: Progressing   Problem:  Respiratory: Goal: Will maintain a patent airway 09/13/2024 1702 by Cecilie Myla SAUNDERS, RN Outcome: Progressing 09/13/2024 1702 by Cecilie Myla SAUNDERS, RN Outcome: Progressing Goal: Complications related to the disease process, condition or treatment will be avoided or minimized 09/13/2024 1702 by Cecilie Myla SAUNDERS, RN Outcome: Progressing 09/13/2024 1702 by Cecilie Myla SAUNDERS, RN Outcome: Progressing

## 2024-09-14 DIAGNOSIS — J9601 Acute respiratory failure with hypoxia: Secondary | ICD-10-CM | POA: Diagnosis not present

## 2024-09-14 DIAGNOSIS — U071 COVID-19: Secondary | ICD-10-CM | POA: Diagnosis not present

## 2024-09-14 LAB — COMPREHENSIVE METABOLIC PANEL WITH GFR
ALT: 29 U/L (ref 0–44)
AST: 34 U/L (ref 15–41)
Albumin: 3.6 g/dL (ref 3.5–5.0)
Alkaline Phosphatase: 39 U/L (ref 38–126)
Anion gap: 12 (ref 5–15)
BUN: 29 mg/dL — ABNORMAL HIGH (ref 8–23)
CO2: 25 mmol/L (ref 22–32)
Calcium: 9.5 mg/dL (ref 8.9–10.3)
Chloride: 103 mmol/L (ref 98–111)
Creatinine, Ser: 0.84 mg/dL (ref 0.44–1.00)
GFR, Estimated: >60 mL/min (ref >60)
Glucose, Bld: 271 mg/dL — ABNORMAL HIGH (ref 70–99)
Potassium: 4.1 mmol/L (ref 3.5–5.1)
Sodium: 140 mmol/L (ref 135–145)
Total Bilirubin: 0.2 mg/dL (ref 0.0–1.2)
Total Protein: 6.3 g/dL — ABNORMAL LOW (ref 6.5–8.1)

## 2024-09-14 LAB — CBC
HCT: 33.8 % — ABNORMAL LOW (ref 36.0–46.0)
Hemoglobin: 10.4 g/dL — ABNORMAL LOW (ref 12.0–15.0)
MCH: 29 pg (ref 26.0–34.0)
MCHC: 30.8 g/dL (ref 30.0–36.0)
MCV: 94.2 fL (ref 80.0–100.0)
Platelets: 269 K/uL (ref 150–400)
RBC: 3.59 MIL/uL — ABNORMAL LOW (ref 3.87–5.11)
RDW: 14.2 % (ref 11.5–15.5)
WBC: 8.6 K/uL (ref 4.0–10.5)
nRBC: 0 % (ref 0.0–0.2)

## 2024-09-14 LAB — GLUCOSE, CAPILLARY
Glucose-Capillary: 155 mg/dL — ABNORMAL HIGH (ref 70–99)
Glucose-Capillary: 230 mg/dL — ABNORMAL HIGH (ref 70–99)
Glucose-Capillary: 257 mg/dL — ABNORMAL HIGH (ref 70–99)
Glucose-Capillary: 259 mg/dL — ABNORMAL HIGH (ref 70–99)
Glucose-Capillary: 262 mg/dL — ABNORMAL HIGH (ref 70–99)

## 2024-09-14 MED ORDER — QUETIAPINE FUMARATE 25 MG PO TABS: 12.5000 mg | ORAL_TABLET | Freq: Two times a day (BID) | ORAL | Status: DC | PRN

## 2024-09-14 NOTE — Progress Notes (Signed)
 PROGRESS NOTE    Elizabeth Seyller  FMW:[id number] DOB: May 12, 1946 DOA: 09/10/2024 PCP: Brien Belvie BRAVO, MD   Brief Narrative: Patient was brought in from skilled nursing facility with hypoxia, generalized weakness and shortness of breath and was found to be COVID-positive.  She is ambulatory at baseline but unable to ambulate due to generalized weakness.  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  Oxygen  saturation was in the 80s on room air at the nursing home.  Patient is not on oxygen  prior to admission.  She remains on high flow nasal cannula to maintain her saturation.  Assessment & Plan:   Principal Problem:   Acute hypoxemic respiratory failure due to COVID-19 [facility])   #1  Severe sepsis secondary to acute hypoxic respiratory failure in the setting of COVID-positive chest x-ray with bilateral infiltrates.  Patient admitted with acute hypoxia 80s on room air in the ED. patient was hypoxic tachypneic and tachycardic on admission. Chest x-ray shows mild left basilar atelectasis or infiltrates with possible small left pleural effusion. Continue Decadron , encourage incentive spirometry Out of bed as able Oxygen  saturation improved now down to 1 L saturating 93% no overnight events noted  #2 left thigh wound seen by wound care Patient had traumatic injury to the left thigh in September 2024 developed necrotizing myositis and osteomyelitis of femur had I&D done same month and was being followed at wound care.  Wound care recommends the followingCleanse L thigh wound with Vashe, using a Q tip applicator apply silver hydrofiber Lawson #[id number] Aquacel [employer]) to wound bed daily and secure with silicone foam or ABD pad and tape whichever is preferred.   #3 dementia continue clozapine  Aricept  and Namenda    Estimated body mass index is 26.23 kg/m as calculated from the following:   Height as of this encounter: 5'  5 (1.651 m).   Weight as of this encounter: 71.5 kg.  DVT prophylaxis: lovenox  Code Status: dnr Family Communication:none Disposition Plan:  Status is: Inpatient Remains inpatient appropriate because: acute illness   Consultants:  None  Procedures: None  Antimicrobials none  Subjective:  No overnight events  Objective: Vitals:   09/14/24 0100 09/14/24 0828 09/14/24 0830 09/14/24 1006  BP:      Pulse:      Resp:      Temp:      TempSrc:      SpO2: 100% (!) 88% 92% 93%  Weight:      Height:        Intake/Output Summary (Last 24 hours) at 09/14/2024 1112 Last data filed at 09/14/2024 0819 Gross per 24 hour  Intake 100 ml  Output 1250 ml  Net -1150 ml   Filed Weights   09/10/24 2000  Weight: 71.5 kg    Examination:  General exam: Appears chronically ill-appearing Respiratory system: Coarse to auscultation. Respiratory effort normal. Cardiovascular system: Rate regular Gastrointestinal system: Abdomen is nondistended, soft and nontender. No organomegaly or masses felt. Normal bowel sounds heard. Central nervous system: Very drowsy  extremities: Edematous   Data Reviewed: I have personally reviewed following labs and imaging studies  CBC: Recent Labs  Lab 09/10/24 1414 09/11/24 0415 09/12/24 1056 09/13/24 0415  WBC 9.1 11.7* 9.4 8.0  NEUTROABS  --   --   --  6.1  HGB 11.4* 10.6* 9.8* 10.2*  HCT 35.9* 33.7* 31.8* 33.0*  MCV 94.0 95.2 96.1 95.7  PLT 226 232 238 271   Basic  Metabolic Panel: Recent Labs  Lab 09/10/24 1414 09/10/24 1618 09/11/24 0415 09/12/24 1056 09/13/24 0415  NA 138 139 140 140 146*  K 3.9 4.3 4.1 4.1 3.9  CL 104 104 106 106 109  CO2 20* 20* 24 21* 24  GLUCOSE 245* 262* 248* 288* 145*  BUN 33* 34* 39* 37* 37*  CREATININE 1.75* 1.97* 1.46* 0.99 0.90  CALCIUM  8.7* 8.7* 8.6* 8.4* 9.0   GFR: Estimated Creatinine Clearance: 51.1 mL/min (by C-G formula based on SCr of 0.9 mg/dL). Liver Function Tests: Recent Labs  Lab  09/10/24 1618 09/11/24 0415 09/12/24 1056 09/13/24 0415  AST 80* 70* 96* 61*  ALT 33 28 33 30  ALKPHOS 44 48 39 48  BILITOT 0.3 0.3 <0.2 0.2  PROT 7.0 6.8 6.4* 6.2*  ALBUMIN 3.8 3.6 3.3* 3.4*   No results for input(s): LIPASE, AMYLASE in the last 168 hours. No results for input(s): AMMONIA in the last 168 hours. Coagulation Profile: No results for input(s): INR, PROTIME in the last 168 hours. Cardiac Enzymes: No results for input(s): CKTOTAL, CKMB, CKMBINDEX, TROPONINI in the last 168 hours. BNP (last 3 results) Recent Labs    09/10/24 1613  PROBNP 324.0*   HbA1C: No results for input(s): HGBA1C in the last 72 hours. CBG: Recent Labs  Lab 09/13/24 0643 09/13/24 1145 09/13/24 1707 09/14/24 0007 09/14/24 0648  GLUCAP 119* 142* 287* 259* 155*   Lipid Profile: No results for input(s): CHOL, HDL, LDLCALC, TRIG, CHOLHDL, LDLDIRECT in the last 72 hours. Thyroid  Function Tests: No results for input(s): TSH, T4TOTAL, FREET4, T3FREE, THYROIDAB in the last 72 hours. Anemia Panel: No results for input(s): VITAMINB12, FOLATE, FERRITIN, TIBC, IRON , RETICCTPCT in the last 72 hours. Sepsis Labs: Recent Labs  Lab 09/10/24 1416 09/10/24 1613 09/10/24 1808 09/10/24 2114  LATICACIDVEN 1.5 2.3* 1.9 2.1*    Recent Results (from the past 240 hours)  Blood culture (routine x 2)     Status: Abnormal (Preliminary result)   Collection Time: 09/10/24  2:32 PM   Specimen: BLOOD LEFT ARM  Result Value Ref Range Status   Specimen Description   Final    BLOOD LEFT ARM Performed at [facility], [address] [place], [place] [address]    Special Requests   Final    BOTTLES DRAWN AEROBIC AND ANAEROBIC Blood Culture results may not be optimal due to an inadequate volume of blood received in culture bottles Performed at [facility], [address], [place], [place] [address]    Culture  Setup Time   Final     GRAM POSITIVE COCCI IN CLUSTERS IN BOTH AEROBIC AND ANAEROBIC BOTTLES Organism ID to follow CRITICAL RESULT CALLED TO, READ BACK BY AND VERIFIED WITH: C. SHADE PHARMD, AT 1447 09/11/24 D. VANHOOK Performed at [facility], [address] [place], [place] [address]    Culture (A)  Final    STAPHYLOCOCCUS CAPITIS THE SIGNIFICANCE OF ISOLATING THIS ORGANISM FROM A SINGLE SET OF BLOOD CULTURES WHEN MULTIPLE SETS ARE DRAWN IS UNCERTAIN. PLEASE NOTIFY THE MICROBIOLOGY DEPARTMENT WITHIN ONE WEEK IF SPECIATION AND SENSITIVITIES ARE REQUIRED. STAPHYLOCOCCUS HOMINIS    Report Status PENDING  Incomplete   Organism ID, Bacteria STAPHYLOCOCCUS HOMINIS  Final      Susceptibility   Staphylococcus hominis - MIC*    CIPROFLOXACIN <=0.5 SENSITIVE Sensitive     ERYTHROMYCIN >=8 RESISTANT Resistant     GENTAMICIN  <=0.5 SENSITIVE Sensitive     OXACILLIN 1 RESISTANT Resistant  TETRACYCLINE <=1 SENSITIVE Sensitive     VANCOMYCIN  <=0.5 SENSITIVE Sensitive     TRIMETH/SULFA 40 SENSITIVE Sensitive     CLINDAMYCIN <=0.25 SENSITIVE Sensitive     RIFAMPIN <=0.5 SENSITIVE Sensitive     Inducible Clindamycin NEGATIVE Sensitive     * STAPHYLOCOCCUS HOMINIS  Blood Culture ID Panel (Reflexed)     Status: Abnormal   Collection Time: 09/10/24  2:32 PM  Result Value Ref Range Status   Enterococcus faecalis NOT DETECTED NOT DETECTED Final   Enterococcus Faecium NOT DETECTED NOT DETECTED Final   Listeria monocytogenes NOT DETECTED NOT DETECTED Final   Staphylococcus species DETECTED (A) NOT DETECTED Final    Comment: CRITICAL RESULT CALLED TO, READ BACK BY AND VERIFIED WITH: C. SHADE PHARMD, AT 1447 09/11/24 D. VANHOOK    Staphylococcus aureus (BCID) NOT DETECTED NOT DETECTED Final   Staphylococcus epidermidis NOT DETECTED NOT DETECTED Final   Staphylococcus lugdunensis NOT DETECTED NOT DETECTED Final   Streptococcus species NOT DETECTED NOT DETECTED Final   Streptococcus agalactiae NOT DETECTED NOT  DETECTED Final   Streptococcus pneumoniae NOT DETECTED NOT DETECTED Final   Streptococcus pyogenes NOT DETECTED NOT DETECTED Final   A.calcoaceticus-baumannii NOT DETECTED NOT DETECTED Final   Bacteroides fragilis NOT DETECTED NOT DETECTED Final   Enterobacterales NOT DETECTED NOT DETECTED Final   Enterobacter cloacae complex NOT DETECTED NOT DETECTED Final   Escherichia coli NOT DETECTED NOT DETECTED Final   Klebsiella aerogenes NOT DETECTED NOT DETECTED Final   Klebsiella oxytoca NOT DETECTED NOT DETECTED Final   Klebsiella pneumoniae NOT DETECTED NOT DETECTED Final   Proteus species NOT DETECTED NOT DETECTED Final   Salmonella species NOT DETECTED NOT DETECTED Final   Serratia marcescens NOT DETECTED NOT DETECTED Final   Haemophilus influenzae NOT DETECTED NOT DETECTED Final   Neisseria meningitidis NOT DETECTED NOT DETECTED Final   Pseudomonas aeruginosa NOT DETECTED NOT DETECTED Final   Stenotrophomonas maltophilia NOT DETECTED NOT DETECTED Final   Candida albicans NOT DETECTED NOT DETECTED Final   Candida auris NOT DETECTED NOT DETECTED Final   Candida glabrata NOT DETECTED NOT DETECTED Final   Candida krusei NOT DETECTED NOT DETECTED Final   Candida parapsilosis NOT DETECTED NOT DETECTED Final   Candida tropicalis NOT DETECTED NOT DETECTED Final   Cryptococcus neoformans/gattii NOT DETECTED NOT DETECTED Final    Comment: Performed at [facility], [address], [place], [place] [address]  Resp panel by RT-PCR (RSV, Flu A&B, Covid) Anterior Nasal Swab     Status: Abnormal   Collection Time: 09/10/24  2:49 PM   Specimen: Anterior Nasal Swab  Result Value Ref Range Status   SARS Coronavirus 2 by RT PCR POSITIVE (A) NEGATIVE Final    Comment: (NOTE) SARS-CoV-2 target nucleic acids are DETECTED.  The SARS-CoV-2 RNA is generally detectable in upper respiratory specimens during the acute phase of infection. Positive results are indicative of the presence of the identified  virus, but do not rule out bacterial infection or co-infection with other pathogens not detected by the test. Clinical correlation with patient history and other diagnostic information is necessary to determine patient infection status. The expected result is Negative.  Fact Sheet for Patients: bloggercourse.com  Fact Sheet for Healthcare Providers: seriousbroker.it  This test is not yet approved or cleared by the [place]  FDA and  has been authorized for detection and/or diagnosis of SARS-CoV-2 by FDA under an Emergency Use Authorization (EUA).  This EUA will remain in effect (meaning this test can  be used) for the duration of  the COVID-19 declaration under Section 564(b)(1) of the A ct, 21 U.S.C. section 360bbb-3(b)(1), unless the authorization is terminated or revoked sooner.     Influenza A by PCR NEGATIVE NEGATIVE Final   Influenza B by PCR NEGATIVE NEGATIVE Final    Comment: (NOTE) The Xpert Xpress SARS-CoV-2/FLU/RSV plus assay is intended as an aid in the diagnosis of influenza from Nasopharyngeal swab specimens and should not be used as a sole basis for treatment. Nasal washings and aspirates are unacceptable for Xpert Xpress SARS-CoV-2/FLU/RSV testing.  Fact Sheet for Patients: bloggercourse.com  Fact Sheet for Healthcare Providers: seriousbroker.it  This test is not yet approved or cleared by the [place]  FDA and has been authorized for detection and/or diagnosis of SARS-CoV-2 by FDA under an Emergency Use Authorization (EUA). This EUA will remain in effect (meaning this test can be used) for the duration of the COVID-19 declaration under Section 564(b)(1) of the Act, 21 U.S.C. section 360bbb-3(b)(1), unless the authorization is terminated or revoked.     Resp Syncytial Virus by PCR NEGATIVE NEGATIVE Final    Comment: (NOTE) Fact Sheet for  Patients: bloggercourse.com  Fact Sheet for Healthcare Providers: seriousbroker.it  This test is not yet approved or cleared by the [place]  FDA and has been authorized for detection and/or diagnosis of SARS-CoV-2 by FDA under an Emergency Use Authorization (EUA). This EUA will remain in effect (meaning this test can be used) for the duration of the COVID-19 declaration under Section 564(b)(1) of the Act, 21 U.S.C. section 360bbb-3(b)(1), unless the authorization is terminated or revoked.  Performed at [facility], [address], [place], [place] [address]   Blood culture (routine x 2)     Status: None (Preliminary result)   Collection Time: 09/10/24  6:08 PM   Specimen: BLOOD RIGHT ARM  Result Value Ref Range Status   Specimen Description   Final    BLOOD RIGHT ARM Performed at [facility], [address], [place], [place] [address]    Special Requests   Final    BOTTLES DRAWN AEROBIC AND ANAEROBIC Blood Culture adequate volume Performed at [facility], [address], [place], [place] [address]    Culture   Final    NO GROWTH 4 DAYS Performed at [facility], [address], [place], [place] [address]    Report Status PENDING  Incomplete  MRSA Next Gen by PCR, Nasal     Status: None   Collection Time: 09/11/24  2:00 AM   Specimen: Nasal Mucosa; Nasal Swab  Result Value Ref Range Status   MRSA by PCR Next Gen NOT DETECTED NOT DETECTED Final    Comment: (NOTE) The GeneXpert MRSA Assay (FDA approved for NASAL specimens only), is one component of a comprehensive MRSA colonization surveillance program. It is not intended to diagnose MRSA infection nor to guide or monitor treatment for MRSA infections. Test performance is not FDA approved in patients less than 24 years old. Performed at [facility], [address] [place], [place] [address]    Culture, blood (Routine X 2) w Reflex to ID Panel     Status: Abnormal   Collection Time: 09/11/24  6:46 PM   Specimen: BLOOD LEFT HAND  Result Value Ref Range Status   Specimen Description   Final    BLOOD LEFT HAND Performed at [facility], [address], [place], [place] [address]  Special Requests   Final    BOTTLES DRAWN AEROBIC ONLY Blood Culture results may not be optimal due to an inadequate volume of blood received in culture bottles Performed at [facility], [address], [place], [place] [address]    Culture  Setup Time   Final    GRAM POSITIVE COCCI IN CLUSTERS BOTTLES DRAWN AEROBIC ONLY CRITICAL VALUE NOTED.  VALUE IS CONSISTENT WITH PREVIOUSLY REPORTED AND CALLED VALUE.    Culture (A)  Final    STAPHYLOCOCCUS HOMINIS SUSCEPTIBILITIES PERFORMED ON PREVIOUS CULTURE WITHIN THE LAST 5 DAYS. Performed at [facility], [address], [place], [place] [address]    Report Status 09/14/2024 FINAL  Final  Culture, blood (Routine X 2) w Reflex to ID Panel     Status: None (Preliminary result)   Collection Time: 09/12/24 12:42 AM   Specimen: BLOOD  Result Value Ref Range Status   Specimen Description   Final    BLOOD RIGHT ANTECUBITAL Performed at [facility], [address], [place], [place] [address]    Special Requests   Final    Blood Culture adequate volume BOTTLES DRAWN AEROBIC ONLY Performed at [facility], [address] [place], [place] [address]    Culture   Final    NO GROWTH 2 DAYS Performed at [facility], [address], [place], [place] [address]    Report Status PENDING  Incomplete         Radiology Studies: No results found.   Scheduled Meds:  calcium -vitamin D   1 tablet Oral Q breakfast   cloZAPine   200 mg Oral QHS   dexamethasone  (DECADRON ) injection  6 mg Intravenous Q24H   donepezil   10 mg Oral QHS   enoxaparin  (LOVENOX ) injection  40 mg Subcutaneous  Q24H   famotidine   20 mg Oral Daily   fluticasone furoate-vilanterol  1 puff Inhalation Daily   insulin  aspart  0-9 Units Subcutaneous Q6H   memantine   5 mg Oral BID   multivitamin with minerals  1 tablet Oral Daily   Continuous Infusions:     LOS: 4 days    Almarie KANDICE Hoots, MD  09/14/2024, 11:12 AM

## 2024-09-15 DIAGNOSIS — U071 COVID-19: Secondary | ICD-10-CM | POA: Diagnosis not present

## 2024-09-15 DIAGNOSIS — J9601 Acute respiratory failure with hypoxia: Secondary | ICD-10-CM | POA: Diagnosis not present

## 2024-09-15 LAB — GLUCOSE, CAPILLARY
Glucose-Capillary: 122 mg/dL — ABNORMAL HIGH (ref 70–99)
Glucose-Capillary: 104 mg/dL — ABNORMAL HIGH (ref 70–99)
Glucose-Capillary: 178 mg/dL — ABNORMAL HIGH (ref 70–99)
Glucose-Capillary: 289 mg/dL — ABNORMAL HIGH (ref 70–99)
Glucose-Capillary: 290 mg/dL — ABNORMAL HIGH (ref 70–99)
Glucose-Capillary: 255 mg/dL — ABNORMAL HIGH (ref 70–99)

## 2024-09-15 MED ORDER — DEXAMETHASONE 4 MG PO TABS
6.0000 mg | ORAL_TABLET | Freq: Every day | ORAL | Status: DC
  Administered 2024-09-16 – 2024-09-17 (×2): 6 mg via ORAL
  Filled 2024-09-15 (×2): qty 1

## 2024-09-15 NOTE — Progress Notes (Signed)
 PHARMACY - PHYSICIAN COMMUNICATION CRITICAL VALUE ALERT - BLOOD CULTURE IDENTIFICATION (BCID)  Elizabeth Schwartz is an 78 y.o. female who presented to [facility] on 09/10/2024 with a chief complaint of  Chief Complaint  Patient presents with   Shortness of Breath     Assessment:  1/4 bottles growing Gram positive rods     Name of physician (or Provider) Contacted: Lynwood Kipper, NP  Current antibiotics: none  Changes to prescribed antibiotics recommended:  No changes, likely contaminant   Results for orders placed or performed during the hospital encounter of 09/10/24  Blood Culture ID Panel (Reflexed) (Collected: 09/10/2024  2:32 PM)  Result Value Ref Range   Enterococcus faecalis NOT DETECTED NOT DETECTED   Enterococcus Faecium NOT DETECTED NOT DETECTED   Listeria monocytogenes NOT DETECTED NOT DETECTED   Staphylococcus species DETECTED (A) NOT DETECTED   Staphylococcus aureus (BCID) NOT DETECTED NOT DETECTED   Staphylococcus epidermidis NOT DETECTED NOT DETECTED   Staphylococcus lugdunensis NOT DETECTED NOT DETECTED   Streptococcus species NOT DETECTED NOT DETECTED   Streptococcus agalactiae NOT DETECTED NOT DETECTED   Streptococcus pneumoniae NOT DETECTED NOT DETECTED   Streptococcus pyogenes NOT DETECTED NOT DETECTED   A.calcoaceticus-baumannii NOT DETECTED NOT DETECTED   Bacteroides fragilis NOT DETECTED NOT DETECTED   Enterobacterales NOT DETECTED NOT DETECTED   Enterobacter cloacae complex NOT DETECTED NOT DETECTED   Escherichia coli NOT DETECTED NOT DETECTED   Klebsiella aerogenes NOT DETECTED NOT DETECTED   Klebsiella oxytoca NOT DETECTED NOT DETECTED   Klebsiella pneumoniae NOT DETECTED NOT DETECTED   Proteus species NOT DETECTED NOT DETECTED   Salmonella species NOT DETECTED NOT DETECTED   Serratia marcescens NOT DETECTED NOT DETECTED   Haemophilus influenzae NOT DETECTED NOT DETECTED   Neisseria meningitidis NOT DETECTED NOT DETECTED   Pseudomonas  aeruginosa NOT DETECTED NOT DETECTED   Stenotrophomonas maltophilia NOT DETECTED NOT DETECTED   Candida albicans NOT DETECTED NOT DETECTED   Candida auris NOT DETECTED NOT DETECTED   Candida glabrata NOT DETECTED NOT DETECTED   Candida krusei NOT DETECTED NOT DETECTED   Candida parapsilosis NOT DETECTED NOT DETECTED   Candida tropicalis NOT DETECTED NOT DETECTED   Cryptococcus neoformans/gattii NOT DETECTED NOT DETECTED    [name] 09/15/2024  8:49 PM

## 2024-09-15 NOTE — Plan of Care (Signed)

## 2024-09-15 NOTE — Progress Notes (Signed)
 PROGRESS NOTE    Elizabeth Schwartz  FMW:[id number] DOB: [birth year] DOA: 09/10/2024 PCP: Brien Belvie BRAVO, MD   Brief Narrative: Patient was brought in from skilled nursing facility with hypoxia, generalized weakness and shortness of breath and was found to be COVID-positive.  She is ambulatory at baseline but unable to ambulate due to generalized weakness.  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  Oxygen  saturation was in the 80s on room air at the nursing home.  Patient is not on oxygen  prior to admission.  She remains on high flow nasal cannula to maintain her saturation.  Assessment & Plan:   Principal Problem:   Acute hypoxemic respiratory failure due to COVID-19 [facility])   #1  Severe sepsis secondary to acute hypoxic respiratory failure in the setting of COVID-positive chest x-ray with bilateral infiltrates.  Patient admitted with acute hypoxia 80s on room air in the ED. patient was hypoxic tachypneic and tachycardic on admission. Chest x-ray shows mild left basilar atelectasis or infiltrates with possible small left pleural effusion. Continue Decadron , encourage incentive spirometry Out of bed as able She is able to maintain her saturations on room air.  #2 left thigh wound seen by wound care Patient had traumatic injury to the left thigh in September 2024 developed necrotizing myositis and osteomyelitis of femur had I&D done same month and was being followed at wound care.  Wound care recommends the followingCleanse L thigh wound with Vashe, using a Q tip applicator apply silver hydrofiber Lawson #[id number] Aquacel [employer]) to wound bed daily and secure with silicone foam or ABD pad and tape whichever is preferred.   #3 dementia continue clozapine  Aricept  and Namenda    Estimated body mass index is 26.23 kg/m as calculated from the following:   Height as of this encounter: 5' 5 (1.651 m).   Weight as of  this encounter: 71.5 kg.  DVT prophylaxis: lovenox  Code Status: dnr Family Communication:none Disposition Plan:  Status is: Inpatient Remains inpatient appropriate because: acute illness   Consultants:  None  Procedures: None  Antimicrobials none  Subjective:  No diarrhea patient resting in bed awake alert  Objective: Vitals:   09/14/24 2035 09/15/24 0408 09/15/24 0514 09/15/24 0844  BP: 139/61 (!) 138/98    Pulse: 61 66    Resp: 17 16    Temp: 98.6 F (37 C) 98.2 F (36.8 C)    TempSrc: Oral Oral    SpO2: 94% 91% 93% 93%  Weight:      Height:        Intake/Output Summary (Last 24 hours) at 09/15/2024 0915 Last data filed at 09/14/2024 1300 Gross per 24 hour  Intake 220 ml  Output --  Net 220 ml   Filed Weights   09/10/24 2000  Weight: 71.5 kg    Examination:  General exam: Appears chronically ill-appearing Respiratory system: Coarse to auscultation. Respiratory effort normal. Cardiovascular system: Rate regular Gastrointestinal system: Abdomen is nondistended, soft and nontender. No organomegaly or masses felt. Normal bowel sounds heard. Central nervous system: Very drowsy  extremities: Edematous   Data Reviewed: I have personally reviewed following labs and imaging studies  CBC: Recent Labs  Lab 09/10/24 1414 09/11/24 0415 09/12/24 1056 09/13/24 0415 09/14/24 1128  WBC 9.1 11.7* 9.4 8.0 8.6  NEUTROABS  --   --   --  6.1  --   HGB 11.4* 10.6* 9.8* 10.2* 10.4*  HCT 35.9* 33.7* 31.8* 33.0* 33.8*  MCV 94.0 95.2 96.1 95.7 94.2  PLT 226 232 238 271 269   Basic Metabolic Panel: Recent Labs  Lab 09/10/24 1618 09/11/24 0415 09/12/24 1056 09/13/24 0415 09/14/24 1128  NA 139 140 140 146* 140  K 4.3 4.1 4.1 3.9 4.1  CL 104 106 106 109 103  CO2 20* 24 21* 24 25  GLUCOSE 262* 248* 288* 145* 271*  BUN 34* 39* 37* 37* 29*  CREATININE 1.97* 1.46* 0.99 0.90 0.84  CALCIUM  8.7* 8.6* 8.4* 9.0 9.5   GFR: Estimated Creatinine Clearance: 54.7 mL/min  (by C-G formula based on SCr of 0.84 mg/dL). Liver Function Tests: Recent Labs  Lab 09/10/24 1618 09/11/24 0415 09/12/24 1056 09/13/24 0415 09/14/24 1128  AST 80* 70* 96* 61* 34  ALT 33 28 33 30 29  ALKPHOS 44 48 39 48 39  BILITOT 0.3 0.3 <0.2 0.2 0.2  PROT 7.0 6.8 6.4* 6.2* 6.3*  ALBUMIN 3.8 3.6 3.3* 3.4* 3.6   No results for input(s): LIPASE, AMYLASE in the last 168 hours. No results for input(s): AMMONIA in the last 168 hours. Coagulation Profile: No results for input(s): INR, PROTIME in the last 168 hours. Cardiac Enzymes: No results for input(s): CKTOTAL, CKMB, CKMBINDEX, TROPONINI in the last 168 hours. BNP (last 3 results) Recent Labs    09/10/24 1613  PROBNP 324.0*   HbA1C: No results for input(s): HGBA1C in the last 72 hours. CBG: Recent Labs  Lab 09/14/24 1137 09/14/24 1723 09/14/24 2356 09/15/24 0613 09/15/24 0815  GLUCAP 230* 257* 262* 122* 104*   Lipid Profile: No results for input(s): CHOL, HDL, LDLCALC, TRIG, CHOLHDL, LDLDIRECT in the last 72 hours. Thyroid  Function Tests: No results for input(s): TSH, T4TOTAL, FREET4, T3FREE, THYROIDAB in the last 72 hours. Anemia Panel: No results for input(s): VITAMINB12, FOLATE, FERRITIN, TIBC, IRON , RETICCTPCT in the last 72 hours. Sepsis Labs: Recent Labs  Lab 09/10/24 1416 09/10/24 1613 09/10/24 1808 09/10/24 2114  LATICACIDVEN 1.5 2.3* 1.9 2.1*    Recent Results (from the past 240 hours)  Blood culture (routine x 2)     Status: Abnormal (Preliminary result)   Collection Time: 09/10/24  2:32 PM   Specimen: BLOOD LEFT ARM  Result Value Ref Range Status   Specimen Description   Final    BLOOD LEFT ARM Performed at [facility], [address], [place], [place] [address]    Special Requests   Final    BOTTLES DRAWN AEROBIC AND ANAEROBIC Blood Culture results may not be optimal due to an inadequate volume of blood received in culture  bottles Performed at [facility], [address], [place], [place] [address]    Culture  Setup Time   Final    GRAM POSITIVE COCCI IN CLUSTERS IN BOTH AEROBIC AND ANAEROBIC BOTTLES Organism ID to follow CRITICAL RESULT CALLED TO, READ BACK BY AND VERIFIED WITH: C. SHADE PHARMD, AT 1447 09/11/24 D. VANHOOK Performed at [facility], [address], [place], [place] [address]    Culture (A)  Final    STAPHYLOCOCCUS CAPITIS THE SIGNIFICANCE OF ISOLATING THIS ORGANISM FROM A SINGLE SET OF BLOOD CULTURES WHEN MULTIPLE SETS ARE DRAWN IS UNCERTAIN. PLEASE NOTIFY THE MICROBIOLOGY DEPARTMENT WITHIN ONE WEEK IF SPECIATION AND SENSITIVITIES ARE REQUIRED. STAPHYLOCOCCUS HOMINIS    Report Status PENDING  Incomplete   Organism ID, Bacteria STAPHYLOCOCCUS HOMINIS  Final      Susceptibility   Staphylococcus hominis - MIC*    CIPROFLOXACIN <=0.5 SENSITIVE Sensitive  ERYTHROMYCIN >=8 RESISTANT Resistant     GENTAMICIN  <=0.5 SENSITIVE Sensitive     OXACILLIN 1 RESISTANT Resistant     TETRACYCLINE <=1 SENSITIVE Sensitive     VANCOMYCIN  <=0.5 SENSITIVE Sensitive     TRIMETH/SULFA 40 SENSITIVE Sensitive     CLINDAMYCIN <=0.25 SENSITIVE Sensitive     RIFAMPIN <=0.5 SENSITIVE Sensitive     Inducible Clindamycin NEGATIVE Sensitive     * STAPHYLOCOCCUS HOMINIS  Blood Culture ID Panel (Reflexed)     Status: Abnormal   Collection Time: 09/10/24  2:32 PM  Result Value Ref Range Status   Enterococcus faecalis NOT DETECTED NOT DETECTED Final   Enterococcus Faecium NOT DETECTED NOT DETECTED Final   Listeria monocytogenes NOT DETECTED NOT DETECTED Final   Staphylococcus species DETECTED (A) NOT DETECTED Final    Comment: CRITICAL RESULT CALLED TO, READ BACK BY AND VERIFIED WITH: C. SHADE PHARMD, AT 1447 09/11/24 D. VANHOOK    Staphylococcus aureus (BCID) NOT DETECTED NOT DETECTED Final   Staphylococcus epidermidis NOT DETECTED NOT DETECTED Final   Staphylococcus lugdunensis NOT  DETECTED NOT DETECTED Final   Streptococcus species NOT DETECTED NOT DETECTED Final   Streptococcus agalactiae NOT DETECTED NOT DETECTED Final   Streptococcus pneumoniae NOT DETECTED NOT DETECTED Final   Streptococcus pyogenes NOT DETECTED NOT DETECTED Final   A.calcoaceticus-baumannii NOT DETECTED NOT DETECTED Final   Bacteroides fragilis NOT DETECTED NOT DETECTED Final   Enterobacterales NOT DETECTED NOT DETECTED Final   Enterobacter cloacae complex NOT DETECTED NOT DETECTED Final   Escherichia coli NOT DETECTED NOT DETECTED Final   Klebsiella aerogenes NOT DETECTED NOT DETECTED Final   Klebsiella oxytoca NOT DETECTED NOT DETECTED Final   Klebsiella pneumoniae NOT DETECTED NOT DETECTED Final   Proteus species NOT DETECTED NOT DETECTED Final   Salmonella species NOT DETECTED NOT DETECTED Final   Serratia marcescens NOT DETECTED NOT DETECTED Final   Haemophilus influenzae NOT DETECTED NOT DETECTED Final   Neisseria meningitidis NOT DETECTED NOT DETECTED Final   Pseudomonas aeruginosa NOT DETECTED NOT DETECTED Final   Stenotrophomonas maltophilia NOT DETECTED NOT DETECTED Final   Candida albicans NOT DETECTED NOT DETECTED Final   Candida auris NOT DETECTED NOT DETECTED Final   Candida glabrata NOT DETECTED NOT DETECTED Final   Candida krusei NOT DETECTED NOT DETECTED Final   Candida parapsilosis NOT DETECTED NOT DETECTED Final   Candida tropicalis NOT DETECTED NOT DETECTED Final   Cryptococcus neoformans/gattii NOT DETECTED NOT DETECTED Final    Comment: Performed at [facility], [address] [place], [place] [address]  Resp panel by RT-PCR (RSV, Flu A&B, Covid) Anterior Nasal Swab     Status: Abnormal   Collection Time: 09/10/24  2:49 PM   Specimen: Anterior Nasal Swab  Result Value Ref Range Status   SARS Coronavirus 2 by RT PCR POSITIVE (A) NEGATIVE Final    Comment: (NOTE) SARS-CoV-2 target nucleic acids are DETECTED.  The SARS-CoV-2 RNA is generally detectable in  upper respiratory specimens during the acute phase of infection. Positive results are indicative of the presence of the identified virus, but do not rule out bacterial infection or co-infection with other pathogens not detected by the test. Clinical correlation with patient history and other diagnostic information is necessary to determine patient infection status. The expected result is Negative.  Fact Sheet for Patients: bloggercourse.com  Fact Sheet for Healthcare Providers: seriousbroker.it  This test is not yet approved or cleared by the [place]  FDA and  has been authorized for  detection and/or diagnosis of SARS-CoV-2 by FDA under an Emergency Use Authorization (EUA).  This EUA will remain in effect (meaning this test can be used) for the duration of  the COVID-19 declaration under Section 564(b)(1) of the A ct, 21 U.S.C. section 360bbb-3(b)(1), unless the authorization is terminated or revoked sooner.     Influenza A by PCR NEGATIVE NEGATIVE Final   Influenza B by PCR NEGATIVE NEGATIVE Final    Comment: (NOTE) The Xpert Xpress SARS-CoV-2/FLU/RSV plus assay is intended as an aid in the diagnosis of influenza from Nasopharyngeal swab specimens and should not be used as a sole basis for treatment. Nasal washings and aspirates are unacceptable for Xpert Xpress SARS-CoV-2/FLU/RSV testing.  Fact Sheet for Patients: bloggercourse.com  Fact Sheet for Healthcare Providers: seriousbroker.it  This test is not yet approved or cleared by the [place]  FDA and has been authorized for detection and/or diagnosis of SARS-CoV-2 by FDA under an Emergency Use Authorization (EUA). This EUA will remain in effect (meaning this test can be used) for the duration of the COVID-19 declaration under Section 564(b)(1) of the Act, 21 U.S.C. section 360bbb-3(b)(1), unless the authorization is  terminated or revoked.     Resp Syncytial Virus by PCR NEGATIVE NEGATIVE Final    Comment: (NOTE) Fact Sheet for Patients: bloggercourse.com  Fact Sheet for Healthcare Providers: seriousbroker.it  This test is not yet approved or cleared by the [place]  FDA and has been authorized for detection and/or diagnosis of SARS-CoV-2 by FDA under an Emergency Use Authorization (EUA). This EUA will remain in effect (meaning this test can be used) for the duration of the COVID-19 declaration under Section 564(b)(1) of the Act, 21 U.S.C. section 360bbb-3(b)(1), unless the authorization is terminated or revoked.  Performed at [facility], [address], [place], [place] [address]   Blood culture (routine x 2)     Status: None (Preliminary result)   Collection Time: 09/10/24  6:08 PM   Specimen: BLOOD RIGHT ARM  Result Value Ref Range Status   Specimen Description   Final    BLOOD RIGHT ARM Performed at [facility], [address], [place], [place] [address]    Special Requests   Final    BOTTLES DRAWN AEROBIC AND ANAEROBIC Blood Culture adequate volume Performed at [facility], [address] [place], [place] [address]    Culture   Final    NO GROWTH 4 DAYS Performed at [facility], [address], [place], [place] [address]    Report Status PENDING  Incomplete  MRSA Next Gen by PCR, Nasal     Status: None   Collection Time: 09/11/24  2:00 AM   Specimen: Nasal Mucosa; Nasal Swab  Result Value Ref Range Status   MRSA by PCR Next Gen NOT DETECTED NOT DETECTED Final    Comment: (NOTE) The GeneXpert MRSA Assay (FDA approved for NASAL specimens only), is one component of a comprehensive MRSA colonization surveillance program. It is not intended to diagnose MRSA infection nor to guide or monitor treatment for MRSA infections. Test performance is not FDA approved in patients  less than 55 years old. Performed at [facility], [address], [place], [place] [address]   Culture, blood (Routine X 2) w Reflex to ID Panel     Status: Abnormal   Collection Time: 09/11/24  6:46 PM   Specimen: BLOOD LEFT HAND  Result Value Ref Range Status   Specimen Description  Final    BLOOD LEFT HAND Performed at [facility], [address], [place], [place] [address]    Special Requests   Final    BOTTLES DRAWN AEROBIC ONLY Blood Culture results may not be optimal due to an inadequate volume of blood received in culture bottles Performed at [facility], [address], [place], [place] [address]    Culture  Setup Time   Final    GRAM POSITIVE COCCI IN CLUSTERS BOTTLES DRAWN AEROBIC ONLY CRITICAL VALUE NOTED.  VALUE IS CONSISTENT WITH PREVIOUSLY REPORTED AND CALLED VALUE.    Culture (A)  Final    STAPHYLOCOCCUS HOMINIS SUSCEPTIBILITIES PERFORMED ON PREVIOUS CULTURE WITHIN THE LAST 5 DAYS. Performed at [facility], [address], [place], [place] [address]    Report Status 09/14/2024 FINAL  Final  Culture, blood (Routine X 2) w Reflex to ID Panel     Status: None (Preliminary result)   Collection Time: 09/12/24 12:42 AM   Specimen: BLOOD  Result Value Ref Range Status   Specimen Description   Final    BLOOD RIGHT ANTECUBITAL Performed at [facility], [address], [place], [place] [address]    Special Requests   Final    Blood Culture adequate volume BOTTLES DRAWN AEROBIC ONLY Performed at [facility], [address], [place], [place] [address]    Culture   Final    NO GROWTH 2 DAYS Performed at [facility], [address], [place], [place] [address]    Report Status PENDING  Incomplete         Radiology Studies: No results found.   Scheduled Meds:  calcium -vitamin D   1 tablet Oral Q breakfast   cloZAPine   200 mg Oral QHS   dexamethasone  (DECADRON )  injection  6 mg Intravenous Q24H   donepezil   10 mg Oral QHS   enoxaparin  (LOVENOX ) injection  40 mg Subcutaneous Q24H   famotidine   20 mg Oral Daily   fluticasone furoate-vilanterol  1 puff Inhalation Daily   insulin  aspart  0-9 Units Subcutaneous Q6H   memantine   5 mg Oral BID   multivitamin with minerals  1 tablet Oral Daily   Continuous Infusions:     LOS: 5 days    Almarie KANDICE Hoots, MD  09/15/2024, 9:15 AM

## 2024-09-15 NOTE — TOC Progression Note (Signed)
 Transition of Care [facility]) - Progression Note    Patient Details  Name: Elizabeth Schwartz MRN: [id number] Date of Birth: [birth year]  [facility] CM/SW Contact  Tawni CHRISTELLA Eva, LCSW Phone Number: 09/15/2024, 11:18 AM  Clinical Narrative:     CSW spoke with Tammy from [facility]. She reported that they do not have beds available at this time. She stated that the pt did not pay for the bed hold, and the next available bed may be on Tuesday. Care Management to follow  Expected Discharge Plan: Long Term Nursing Home                Expected Discharge Plan and Services                                               Social Drivers of Health (SDOH) Interventions SDOH Screenings   Food Insecurity: No Food Insecurity (09/10/2024)  Housing: Low Risk  (09/10/2024)  Transportation Needs: No Transportation Needs (09/10/2024)  Utilities: Not At Risk (09/10/2024)  Alcohol Screen: Low Risk  (03/22/2023)  Depression (PHQ2-9): Low Risk  (07/13/2023)  Financial Resource Strain: Low Risk  (03/22/2023)  Physical Activity: Insufficiently Active (03/22/2023)  Social Connections: Socially Isolated (09/11/2024)  Stress: No Stress Concern Present (03/22/2023)  Tobacco Use: Medium Risk (09/10/2024)    Readmission Risk Interventions     No data to display

## 2024-09-16 DIAGNOSIS — U071 COVID-19: Secondary | ICD-10-CM | POA: Diagnosis not present

## 2024-09-16 DIAGNOSIS — J9601 Acute respiratory failure with hypoxia: Secondary | ICD-10-CM | POA: Diagnosis not present

## 2024-09-16 LAB — GLUCOSE, CAPILLARY
Glucose-Capillary: 135 mg/dL — ABNORMAL HIGH (ref 70–99)
Glucose-Capillary: 189 mg/dL — ABNORMAL HIGH (ref 70–99)
Glucose-Capillary: 213 mg/dL — ABNORMAL HIGH (ref 70–99)
Glucose-Capillary: 290 mg/dL — ABNORMAL HIGH (ref 70–99)
Glucose-Capillary: 300 mg/dL — ABNORMAL HIGH (ref 70–99)

## 2024-09-16 LAB — CULTURE, BLOOD (ROUTINE X 2)

## 2024-09-16 MED ORDER — INSULIN ASPART 100 UNIT/ML IJ SOLN
0.0000 [IU] | INTRAMUSCULAR | Status: DC
  Administered 2024-09-16 (×2): 5 [IU] via SUBCUTANEOUS
  Administered 2024-09-17: 2 [IU] via SUBCUTANEOUS
  Administered 2024-09-17: 1 [IU] via SUBCUTANEOUS
  Administered 2024-09-17: 7 [IU] via SUBCUTANEOUS
  Administered 2024-09-17: 2 [IU] via SUBCUTANEOUS
  Administered 2024-09-17: 3 [IU] via SUBCUTANEOUS
  Filled 2024-09-16: qty 2
  Filled 2024-09-16: qty 3
  Filled 2024-09-16: qty 1
  Filled 2024-09-16: qty 7

## 2024-09-16 NOTE — Progress Notes (Signed)
 PROGRESS NOTE    Elizabeth Schwartz  FMW:[id number] DOB: 04/13/1946 DOA: 09/10/2024 PCP: Brien Belvie BRAVO, MD   Brief Narrative: Patient was brought in from skilled nursing facility with hypoxia, generalized weakness and shortness of breath and was found to be COVID-positive.  She is ambulatory at baseline but unable to ambulate due to generalized weakness.  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  Oxygen  saturation was in the 80s on room air at the nursing home.  Patient is not on oxygen  prior to admission.  She remains on high flow nasal cannula to maintain her saturation.  Assessment & Plan:   Principal Problem:   Acute hypoxemic respiratory failure due to COVID-19 [facility])   #1  Severe sepsis secondary to acute hypoxic respiratory failure in the setting of COVID-positive chest x-ray with bilateral infiltrates.  Patient admitted with acute hypoxia 80s on room air in the ED. patient was hypoxic tachypneic and tachycardic on admission. Chest x-ray shows mild left basilar atelectasis or infiltrates with possible small left pleural effusion. Continue Decadron , encourage incentive spirometry Out of bed as able She is able to maintain her saturations on room air.  #2 left thigh wound seen by wound care Patient had traumatic injury to the left thigh in September 2024 developed necrotizing myositis and osteomyelitis of femur had I&D done same month and was being followed at wound care.  Wound care recommends the followingCleanse L thigh wound with Vashe, using a Q tip applicator apply silver hydrofiber Lawson #[id number] Aquacel AG) to wound bed daily and secure with silicone foam or ABD pad and tape whichever is preferred.   #3 dementia continue clozapine  Aricept  and Namenda    Estimated body mass index is 26.23 kg/m as calculated from the following:   Height as of this encounter: 5' 5 (1.651 m).   Weight as of  this encounter: 71.5 kg.  DVT prophylaxis: lovenox  Code Status: dnr Family Communication:none Disposition Plan:  Status is: Inpatient Remains inpatient appropriate because: acute illness   Consultants:  None  Procedures: None  Antimicrobials none  Subjective: Slept well denies any nausea vomiting diarrhea  Objective: Vitals:   09/15/24 0844 09/15/24 1338 09/15/24 2101 09/16/24 0516  BP:  131/68 129/72 (!) 154/67  Pulse:  73 64 69  Resp:  16 18 19   Temp:  98.6 F (37 C) 98.7 F (37.1 C) 98 F (36.7 C)  TempSrc:  Oral Oral Oral  SpO2: 93% 96% 93% 94%  Weight:      Height:        Intake/Output Summary (Last 24 hours) at 09/16/2024 1048 Last data filed at 09/16/2024 0534 Gross per 24 hour  Intake 120 ml  Output 975 ml  Net -855 ml   Filed Weights   09/10/24 2000  Weight: 71.5 kg    Examination:  General exam: Appears chronically ill-appearing Respiratory system: Coarse to auscultation. Respiratory effort normal. Cardiovascular system: Rate regular Gastrointestinal system: Abdomen is nondistended, soft and nontender. No organomegaly or masses felt. Normal bowel sounds heard. Central nervous system: Very drowsy  extremities: Edematous   Data Reviewed: I have personally reviewed following labs and imaging studies  CBC: Recent Labs  Lab 09/10/24 1414 09/11/24 0415 09/12/24 1056 09/13/24 0415 09/14/24 1128  WBC 9.1 11.7* 9.4 8.0 8.6  NEUTROABS  --   --   --  6.1  --   HGB 11.4* 10.6* 9.8* 10.2* 10.4*  HCT 35.9* 33.7* 31.8* 33.0*  33.8*  MCV 94.0 95.2 96.1 95.7 94.2  PLT 226 232 238 271 269   Basic Metabolic Panel: Recent Labs  Lab 09/10/24 1618 09/11/24 0415 09/12/24 1056 09/13/24 0415 09/14/24 1128  NA 139 140 140 146* 140  K 4.3 4.1 4.1 3.9 4.1  CL 104 106 106 109 103  CO2 20* 24 21* 24 25  GLUCOSE 262* 248* 288* 145* 271*  BUN 34* 39* 37* 37* 29*  CREATININE 1.97* 1.46* 0.99 0.90 0.84  CALCIUM  8.7* 8.6* 8.4* 9.0 9.5   GFR: Estimated  Creatinine Clearance: 54.7 mL/min (by C-G formula based on SCr of 0.84 mg/dL). Liver Function Tests: Recent Labs  Lab 09/10/24 1618 09/11/24 0415 09/12/24 1056 09/13/24 0415 09/14/24 1128  AST 80* 70* 96* 61* 34  ALT 33 28 33 30 29  ALKPHOS 44 48 39 48 39  BILITOT 0.3 0.3 <0.2 0.2 0.2  PROT 7.0 6.8 6.4* 6.2* 6.3*  ALBUMIN 3.8 3.6 3.3* 3.4* 3.6   No results for input(s): LIPASE, AMYLASE in the last 168 hours. No results for input(s): AMMONIA in the last 168 hours. Coagulation Profile: No results for input(s): INR, PROTIME in the last 168 hours. Cardiac Enzymes: No results for input(s): CKTOTAL, CKMB, CKMBINDEX, TROPONINI in the last 168 hours. BNP (last 3 results) Recent Labs    09/10/24 1613  PROBNP 324.0*   HbA1C: No results for input(s): HGBA1C in the last 72 hours. CBG: Recent Labs  Lab 09/15/24 1631 09/15/24 1826 09/15/24 2051 09/16/24 0039 09/16/24 0641  GLUCAP 289* 290* 255* 189* 135*   Lipid Profile: No results for input(s): CHOL, HDL, LDLCALC, TRIG, CHOLHDL, LDLDIRECT in the last 72 hours. Thyroid  Function Tests: No results for input(s): TSH, T4TOTAL, FREET4, T3FREE, THYROIDAB in the last 72 hours. Anemia Panel: No results for input(s): VITAMINB12, FOLATE, FERRITIN, TIBC, IRON , RETICCTPCT in the last 72 hours. Sepsis Labs: Recent Labs  Lab 09/10/24 1416 09/10/24 1613 09/10/24 1808 09/10/24 2114  LATICACIDVEN 1.5 2.3* 1.9 2.1*    Recent Results (from the past 240 hours)  Blood culture (routine x 2)     Status: Abnormal (Preliminary result)   Collection Time: 09/10/24  2:32 PM   Specimen: BLOOD LEFT ARM  Result Value Ref Range Status   Specimen Description   Final    BLOOD LEFT ARM Performed at [facility], [address] [place], [place] [address]    Special Requests   Final    BOTTLES DRAWN AEROBIC AND ANAEROBIC Blood Culture results may not be optimal due to an inadequate volume of  blood received in culture bottles Performed at [facility], [address] [place], [place] [address]    Culture  Setup Time   Final    GRAM POSITIVE COCCI IN CLUSTERS IN BOTH AEROBIC AND ANAEROBIC BOTTLES Organism ID to follow CRITICAL RESULT CALLED TO, READ BACK BY AND VERIFIED WITH: C. SHADE PHARMD, AT 1447 09/11/24 D. VANHOOK Performed at [facility], [address], [place], [place] [address]    Culture (A)  Final    STAPHYLOCOCCUS CAPITIS THE SIGNIFICANCE OF ISOLATING THIS ORGANISM FROM A SINGLE SET OF BLOOD CULTURES WHEN MULTIPLE SETS ARE DRAWN IS UNCERTAIN. PLEASE NOTIFY THE MICROBIOLOGY DEPARTMENT WITHIN ONE WEEK IF SPECIATION AND SENSITIVITIES ARE REQUIRED. STAPHYLOCOCCUS HOMINIS    Report Status PENDING  Incomplete   Organism ID, Bacteria STAPHYLOCOCCUS HOMINIS  Final      Susceptibility   Staphylococcus hominis - MIC*    CIPROFLOXACIN <=0.5 SENSITIVE  Sensitive     ERYTHROMYCIN >=8 RESISTANT Resistant     GENTAMICIN  <=0.5 SENSITIVE Sensitive     OXACILLIN 1 RESISTANT Resistant     TETRACYCLINE <=1 SENSITIVE Sensitive     VANCOMYCIN  <=0.5 SENSITIVE Sensitive     TRIMETH/SULFA 40 SENSITIVE Sensitive     CLINDAMYCIN <=0.25 SENSITIVE Sensitive     RIFAMPIN <=0.5 SENSITIVE Sensitive     Inducible Clindamycin NEGATIVE Sensitive     * STAPHYLOCOCCUS HOMINIS  Blood Culture ID Panel (Reflexed)     Status: Abnormal   Collection Time: 09/10/24  2:32 PM  Result Value Ref Range Status   Enterococcus faecalis NOT DETECTED NOT DETECTED Final   Enterococcus Faecium NOT DETECTED NOT DETECTED Final   Listeria monocytogenes NOT DETECTED NOT DETECTED Final   Staphylococcus species DETECTED (A) NOT DETECTED Final    Comment: CRITICAL RESULT CALLED TO, READ BACK BY AND VERIFIED WITH: C. SHADE PHARMD, AT 1447 09/11/24 D. VANHOOK    Staphylococcus aureus (BCID) NOT DETECTED NOT DETECTED Final   Staphylococcus epidermidis NOT DETECTED NOT DETECTED Final    Staphylococcus lugdunensis NOT DETECTED NOT DETECTED Final   Streptococcus species NOT DETECTED NOT DETECTED Final   Streptococcus agalactiae NOT DETECTED NOT DETECTED Final   Streptococcus pneumoniae NOT DETECTED NOT DETECTED Final   Streptococcus pyogenes NOT DETECTED NOT DETECTED Final   A.calcoaceticus-baumannii NOT DETECTED NOT DETECTED Final   Bacteroides fragilis NOT DETECTED NOT DETECTED Final   Enterobacterales NOT DETECTED NOT DETECTED Final   Enterobacter cloacae complex NOT DETECTED NOT DETECTED Final   Escherichia coli NOT DETECTED NOT DETECTED Final   Klebsiella aerogenes NOT DETECTED NOT DETECTED Final   Klebsiella oxytoca NOT DETECTED NOT DETECTED Final   Klebsiella pneumoniae NOT DETECTED NOT DETECTED Final   Proteus species NOT DETECTED NOT DETECTED Final   Salmonella species NOT DETECTED NOT DETECTED Final   Serratia marcescens NOT DETECTED NOT DETECTED Final   Haemophilus influenzae NOT DETECTED NOT DETECTED Final   Neisseria meningitidis NOT DETECTED NOT DETECTED Final   Pseudomonas aeruginosa NOT DETECTED NOT DETECTED Final   Stenotrophomonas maltophilia NOT DETECTED NOT DETECTED Final   Candida albicans NOT DETECTED NOT DETECTED Final   Candida auris NOT DETECTED NOT DETECTED Final   Candida glabrata NOT DETECTED NOT DETECTED Final   Candida krusei NOT DETECTED NOT DETECTED Final   Candida parapsilosis NOT DETECTED NOT DETECTED Final   Candida tropicalis NOT DETECTED NOT DETECTED Final   Cryptococcus neoformans/gattii NOT DETECTED NOT DETECTED Final    Comment: Performed at [facility], [address] [place], [place] [address]  Resp panel by RT-PCR (RSV, Flu A&B, Covid) Anterior Nasal Swab     Status: Abnormal   Collection Time: 09/10/24  2:49 PM   Specimen: Anterior Nasal Swab  Result Value Ref Range Status   SARS Coronavirus 2 by RT PCR POSITIVE (A) NEGATIVE Final    Comment: (NOTE) SARS-CoV-2 target nucleic acids are DETECTED.  The SARS-CoV-2 RNA  is generally detectable in upper respiratory specimens during the acute phase of infection. Positive results are indicative of the presence of the identified virus, but do not rule out bacterial infection or co-infection with other pathogens not detected by the test. Clinical correlation with patient history and other diagnostic information is necessary to determine patient infection status. The expected result is Negative.  Fact Sheet for Patients: bloggercourse.com  Fact Sheet for Healthcare Providers: seriousbroker.it  This test is not yet approved or cleared by the [place]  FDA and  has been authorized for detection and/or diagnosis of SARS-CoV-2 by FDA under an Emergency Use Authorization (EUA).  This EUA will remain in effect (meaning this test can be used) for the duration of  the COVID-19 declaration under Section 564(b)(1) of the A ct, 21 U.S.C. section 360bbb-3(b)(1), unless the authorization is terminated or revoked sooner.     Influenza A by PCR NEGATIVE NEGATIVE Final   Influenza B by PCR NEGATIVE NEGATIVE Final    Comment: (NOTE) The Xpert Xpress SARS-CoV-2/FLU/RSV plus assay is intended as an aid in the diagnosis of influenza from Nasopharyngeal swab specimens and should not be used as a sole basis for treatment. Nasal washings and aspirates are unacceptable for Xpert Xpress SARS-CoV-2/FLU/RSV testing.  Fact Sheet for Patients: bloggercourse.com  Fact Sheet for Healthcare Providers: seriousbroker.it  This test is not yet approved or cleared by the [place]  FDA and has been authorized for detection and/or diagnosis of SARS-CoV-2 by FDA under an Emergency Use Authorization (EUA). This EUA will remain in effect (meaning this test can be used) for the duration of the COVID-19 declaration under Section 564(b)(1) of the Act, 21 U.S.C. section 360bbb-3(b)(1),  unless the authorization is terminated or revoked.     Resp Syncytial Virus by PCR NEGATIVE NEGATIVE Final    Comment: (NOTE) Fact Sheet for Patients: bloggercourse.com  Fact Sheet for Healthcare Providers: seriousbroker.it  This test is not yet approved or cleared by the [place]  FDA and has been authorized for detection and/or diagnosis of SARS-CoV-2 by FDA under an Emergency Use Authorization (EUA). This EUA will remain in effect (meaning this test can be used) for the duration of the COVID-19 declaration under Section 564(b)(1) of the Act, 21 U.S.C. section 360bbb-3(b)(1), unless the authorization is terminated or revoked.  Performed at [facility], [address], [place], [place] [address]   Blood culture (routine x 2)     Status: None (Preliminary result)   Collection Time: 09/10/24  6:08 PM   Specimen: BLOOD RIGHT ARM  Result Value Ref Range Status   Specimen Description   Final    BLOOD RIGHT ARM Performed at [facility], [address], [place], [place] [address]    Special Requests   Final    BOTTLES DRAWN AEROBIC AND ANAEROBIC Blood Culture adequate volume Performed at [facility], [address] [place], [place] [address]    Culture  Setup Time   Final    GRAM POSITIVE RODS AEROBIC BOTTLE ONLY CRITICAL RESULT CALLED TO, READ BACK BY AND VERIFIED WITH: PHARMD NIC GLOGOVAC [id number] AT 2043 BY EC    Culture   Final    GRAM POSITIVE RODS CORRECTED ON 11/02 AT 2041: PREVIOUSLY REPORTED AS NO GROWTH 5 DAYS TOO YOUNG TO READ Performed at [facility], [address], [place], [place] [address]    Report Status PENDING  Incomplete  MRSA Next Gen by PCR, Nasal     Status: None   Collection Time: 09/11/24  2:00 AM   Specimen: Nasal Mucosa; Nasal Swab  Result Value Ref Range Status   MRSA by PCR Next Gen NOT DETECTED NOT DETECTED Final    Comment:  (NOTE) The GeneXpert MRSA Assay (FDA approved for NASAL specimens only), is one component of a comprehensive MRSA colonization surveillance program. It is not intended to diagnose MRSA infection nor to guide or monitor treatment for MRSA infections. Test performance is not FDA approved in patients less than 38 years old.  Performed at [facility], [address], [place], [place] [address]   Culture, blood (Routine X 2) w Reflex to ID Panel     Status: Abnormal   Collection Time: 09/11/24  6:46 PM   Specimen: BLOOD LEFT HAND  Result Value Ref Range Status   Specimen Description   Final    BLOOD LEFT HAND Performed at [facility], [address] [place], [place] [address]    Special Requests   Final    BOTTLES DRAWN AEROBIC ONLY Blood Culture results may not be optimal due to an inadequate volume of blood received in culture bottles Performed at [facility], [address], [place], [place] [address]    Culture  Setup Time   Final    GRAM POSITIVE COCCI IN CLUSTERS BOTTLES DRAWN AEROBIC ONLY CRITICAL VALUE NOTED.  VALUE IS CONSISTENT WITH PREVIOUSLY REPORTED AND CALLED VALUE.    Culture (A)  Final    STAPHYLOCOCCUS HOMINIS SUSCEPTIBILITIES PERFORMED ON PREVIOUS CULTURE WITHIN THE LAST 5 DAYS. Performed at [facility], [address] [place], [place] [address]    Report Status 09/14/2024 FINAL  Final  Culture, blood (Routine X 2) w Reflex to ID Panel     Status: None (Preliminary result)   Collection Time: 09/12/24 12:42 AM   Specimen: BLOOD  Result Value Ref Range Status   Specimen Description   Final    BLOOD RIGHT ANTECUBITAL Performed at [facility], [address], [place], [place] [address]    Special Requests   Final    Blood Culture adequate volume BOTTLES DRAWN AEROBIC ONLY Performed at [facility], [address], [place], [place] [address]    Culture   Final    NO  GROWTH 4 DAYS Performed at [facility], [address] [place], [place] [address]    Report Status PENDING  Incomplete         Radiology Studies: No results found.   Scheduled Meds:  calcium -vitamin D   1 tablet Oral Q breakfast   cloZAPine   200 mg Oral QHS   dexamethasone   6 mg Oral Daily   donepezil   10 mg Oral QHS   enoxaparin  (LOVENOX ) injection  40 mg Subcutaneous Q24H   famotidine   20 mg Oral Daily   fluticasone furoate-vilanterol  1 puff Inhalation Daily   insulin  aspart  0-9 Units Subcutaneous Q6H   memantine   5 mg Oral BID   multivitamin with minerals  1 tablet Oral Daily   Continuous Infusions:     LOS: 6 days    Almarie KANDICE Hoots, MD  09/16/2024, 10:48 AM

## 2024-09-16 NOTE — Plan of Care (Signed)

## 2024-09-16 NOTE — TOC Progression Note (Signed)
 Transition of Care [facility]) - Progression Note    Patient Details  Name: Elizabeth Schwartz MRN: [id number] Date of Birth: 1946-01-22  [facility] CM/SW Contact  Tawni CHRISTELLA Eva, LCSW Phone Number: 09/16/2024, 4:29 PM  Clinical Narrative:     CSW spoke with Tammy admission with [facility]. Pt can d/c back to facility tomorrow after 4pm. MD and RN made aware. Care management to follow.   Expected Discharge Plan: Long Term Nursing Home Barriers to Discharge: Continued Medical Work up               Expected Discharge Plan and Services                                               Social Drivers of Health (SDOH) Interventions SDOH Screenings   Food Insecurity: No Food Insecurity (09/10/2024)  Housing: Low Risk  (09/10/2024)  Transportation Needs: No Transportation Needs (09/10/2024)  Utilities: Not At Risk (09/10/2024)  Alcohol Screen: Low Risk  (03/22/2023)  Depression (PHQ2-9): Low Risk  (07/13/2023)  Financial Resource Strain: Low Risk  (03/22/2023)  Physical Activity: Insufficiently Active (03/22/2023)  Social Connections: Socially Isolated (09/11/2024)  Stress: No Stress Concern Present (03/22/2023)  Tobacco Use: Medium Risk (09/10/2024)    Readmission Risk Interventions     No data to display

## 2024-09-17 DIAGNOSIS — U071 COVID-19: Secondary | ICD-10-CM | POA: Diagnosis not present

## 2024-09-17 DIAGNOSIS — J9601 Acute respiratory failure with hypoxia: Secondary | ICD-10-CM | POA: Diagnosis not present

## 2024-09-17 LAB — CULTURE, BLOOD (ROUTINE X 2)
Culture: NO GROWTH
Special Requests: ADEQUATE

## 2024-09-17 LAB — GLUCOSE, CAPILLARY
Glucose-Capillary: 116 mg/dL — ABNORMAL HIGH (ref 70–99)
Glucose-Capillary: 122 mg/dL — ABNORMAL HIGH (ref 70–99)
Glucose-Capillary: 171 mg/dL — ABNORMAL HIGH (ref 70–99)
Glucose-Capillary: 184 mg/dL — ABNORMAL HIGH (ref 70–99)
Glucose-Capillary: 212 mg/dL — ABNORMAL HIGH (ref 70–99)
Glucose-Capillary: 313 mg/dL — ABNORMAL HIGH (ref 70–99)

## 2024-09-17 MED ORDER — ADULT MULTIVITAMIN W/MINERALS CH: 1.0000 | ORAL_TABLET | Freq: Every day | ORAL | Status: AC

## 2024-09-17 NOTE — TOC Transition Note (Signed)
 Transition of Care [facility]) - Discharge Note   Patient Details  Name: Elizabeth Schwartz MRN: [id number] Date of Birth: May 25, 1946  [facility]) CM/SW Contact:  Tawni CHRISTELLA Eva, LCSW Phone Number: 09/17/2024, 10:48 AM   Clinical Narrative:     Pt to d/c back to [facility] LTC facility. Pt's room 125A , RN to call report to [phone]. CSW called [facility] and scheduled transfer for 4:30pm. Care management to sign off.     Final next level of care: Long Term Nursing Home Barriers to Discharge: Barriers Resolved   Patient Goals and CMS Choice Patient states their goals for this hospitalization and ongoing recovery are:: retrun to LTC facility          Discharge Placement                    Patient and family notified of of transfer: 09/17/24  Discharge Plan and Services Additional resources added to the After Visit Summary for                                       Social Drivers of Health (SDOH) Interventions SDOH Screenings   Food Insecurity: No Food Insecurity (09/10/2024)  Housing: Low Risk  (09/10/2024)  Transportation Needs: No Transportation Needs (09/10/2024)  Utilities: Not At Risk (09/10/2024)  Alcohol Screen: Low Risk  (03/22/2023)  Depression (PHQ2-9): Low Risk  (07/13/2023)  Financial Resource Strain: Low Risk  (03/22/2023)  Physical Activity: Insufficiently Active (03/22/2023)  Social Connections: Socially Isolated (09/11/2024)  Stress: No Stress Concern Present (03/22/2023)  Tobacco Use: Medium Risk (09/10/2024)     Readmission Risk Interventions     No data to display

## 2024-09-17 NOTE — Progress Notes (Signed)
 Called [facility] nurse Biancan at [phone] and gave report.

## 2024-09-17 NOTE — Plan of Care (Incomplete)
  Problem: Education: Goal: Knowledge of General Education information will improve Description: Including pain rating scale, medication(s)/side effects and non-pharmacologic comfort measures Outcome: Progressing   Problem: Health Behavior/Discharge Planning: Goal: Ability to manage health-related needs will improve Outcome: Progressing   Problem: Clinical Measurements: Goal: Ability to maintain clinical measurements within normal limits will improve Outcome: Progressing Goal: Diagnostic test results will improve Outcome: Progressing   Problem: Activity: Goal: Risk for activity intolerance will decrease Outcome: Progressing   Problem: Clinical Measurements: Goal: Will remain free from infection Outcome: Adequate for Discharge Goal: Respiratory complications will improve Outcome: Adequate for Discharge Goal: Cardiovascular complication will be avoided Outcome: Adequate for Discharge   Problem: Nutrition: Goal: Adequate nutrition will be maintained Outcome: Adequate for Discharge   Problem: Coping: Goal: Level of anxiety will decrease Outcome: Adequate for Discharge   Problem: Elimination: Goal: Will not experience complications related to bowel motility Outcome: Adequate for Discharge Goal: Will not experience complications related to urinary retention Outcome: Adequate for Discharge

## 2024-09-17 NOTE — Discharge Summary (Signed)
 Physician Discharge Summary  Elizabeth Schwartz FMW:[id number] DOB: 1945-11-15 DOA: 09/10/2024  PCP: Brien Belvie BRAVO, MD  Admit date: 09/10/2024 Discharge date: 09/17/2024  Admitted From: home Disposition:  home Recommendations for Outpatient Follow-up:  Follow up with PCP in 1-2 weeks Please obtain BMP/CBC in one week  Home Health:no Equipment/Devices:none  Discharge Condition: Stable CODE STATUS: DNR Diet recommendation: Cardiac  Brief/Interim Summary:Patient was brought in from skilled nursing facility with hypoxia, generalized weakness and shortness of breath and was found to be COVID-positive. She is ambulatory at baseline but unable to ambulate due to generalized weakness. 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 Oxygen  saturation was in the 80s on room air at the nursing home. Patient is not on oxygen  prior to admission. She was on high flow nasal cannula to maintain her saturation.     Discharge Diagnoses:  Principal Problem:   Acute hypoxemic respiratory failure due to COVID-19 [facility])    #1  Severe sepsis secondary to acute hypoxic respiratory failure in the setting of COVID-positive chest x-ray with bilateral infiltrates.  Patient admitted with acute hypoxia 80s on room air in the ED. patient was hypoxic tachypneic and tachycardic on admission. Chest x-ray shows mild left basilar atelectasis or infiltrates with possible small left pleural effusion.  She was treated with remdesivir and Decadron  incentive spirometry and nebulizers.  Initially on high flow nasal cannula on the day of discharge she was on room air and is able to maintain her saturations.  #2 left thigh wound seen by wound care Patient had traumatic injury to the left thigh in September 2024 developed necrotizing myositis and osteomyelitis of femur had I&D done same month and was being followed at wound care.  Wound care  recommends the following Cleanse L thigh wound with Vashe, using a Q tip applicator apply silver hydrofiber Lawson #[id number] Aquacel AG) to wound bed daily and secure with silicone foam or ABD pad and tape whichever is preferred.    #3 dementia continue clozapine  Aricept  and Namenda     Estimated body mass index is 26.23 kg/m as calculated from the following:   Height as of this encounter: 5' 5 (1.651 m).   Weight as of this encounter: 71.5 kg.  Discharge Instructions  Discharge Instructions     Call MD for:  difficulty breathing, headache or visual disturbances   Complete by: As directed    Call MD for:  persistant dizziness or light-headedness   Complete by: As directed    Call MD for:  persistant nausea and vomiting   Complete by: As directed    Diet - low sodium heart healthy   Complete by: As directed    Discharge wound care:   Complete by: As directed    Cleanse left thigh wound with vashe using a Q-tip applicator apply silver Hydrofiber to wound bed daily and secure with silicone foam or ABD pad and tape whichever is preferred   Increase activity slowly   Complete by: As directed       Allergies as of 09/17/2024       Reactions   Crestor [rosuvastatin] Other (See Comments)   Muscle aches and Allergic, per facility   Fish Allergy Other (See Comments)   Allergic, per facility   Shellfish Allergy Other (See Comments)   Possibly allergic. Paperwork from the facility cited fish as a KNOWN ALLERGY.        Medication List  STOP taking these medications    albuterol  108 (90 Base) MCG/ACT inhaler Commonly known as: Ventolin  HFA   alendronate  70 MG tablet Commonly known as: FOSAMAX    oxyCODONE  5 MG immediate release tablet Commonly known as: Oxy IR/ROXICODONE    Symbicort  160-4.5 MCG/ACT inhaler Generic drug: budesonide -formoterol        TAKE these medications    acetaminophen  325 MG tablet Commonly known as: TYLENOL  Take 2 tablets (650 mg total) by  mouth every 6 (six) hours as needed for mild pain (or Fever >/= 101). What changed: reasons to take this   ascorbic acid  500 MG tablet Commonly known as: VITAMIN C  Take 500 mg by mouth every morning.   Calcium  600+D3 600-20 MG-MCG Tabs Generic drug: Calcium  Carb-Cholecalciferol  Take 1 tablet by mouth daily.   clozapine  200 MG tablet Commonly known as: CLOZARIL  Take 1 tablet (200 mg total) by mouth at bedtime.   DAKINS EX Apply 1 application  topically See admin instructions. Apply to left thigh wound once a day for wound healing. Clean with Dakin's solution, apply Honeyfiber to wound bed, nystatin cream to peri-wound, and dress with ABD pad + tape- daily and as needed.   donepezil  10 MG tablet Commonly known as: ARICEPT  Take 1 tablet (10 mg total) by mouth at bedtime.   ezetimibe  10 MG tablet Commonly known as: Zetia  Take 1 tablet (10 mg total) by mouth daily. What changed: when to take this   famotidine  20 MG tablet Commonly known as: PEPCID  Take 1 tablet (20 mg total) by mouth 2 (two) times daily.   fenofibrate  160 MG tablet Take 1 tablet (160 mg total) by mouth daily.   fluticasone-salmeterol 250-50 MCG/ACT Aepb Commonly known as: ADVAIR Inhale 1 puff into the lungs in the morning and at bedtime.   Iron  (Ferrous Sulfate ) 325 (65 Fe) MG Tabs Take 325 mg by mouth daily with breakfast.   memantine  5 MG tablet Commonly known as: NAMENDA  Take 1 tablet (5 mg total) by mouth 2 (two) times daily.   multivitamin with minerals Tabs tablet Take 1 tablet by mouth daily.   MYLANTA DOUBLE-STRENGTH PO Take 10 mLs by mouth every 4 (four) hours as needed (for indigestion, heartburn, gas, or nausea).   nystatin cream Commonly known as: MYCOSTATIN Apply 1 Application topically See admin instructions. Apply to peri-wound on left thigh every day shift for rash   ondansetron  4 MG tablet Commonly known as: ZOFRAN  Take 4 mg by mouth every 8 (eight) hours as needed for nausea or  vomiting (may dissolve in 5 ml's of water, if necessary).   OneTouch Delica Lancets 33G Misc Use to check blood sugar daily   OneTouch Verio test strip Generic drug: glucose blood Use as instructed   OneTouch Verio w/Device Kit Use to check blood sugar               Discharge Care Instructions  (From admission, onward)           Start     Ordered   09/17/24 0000  Discharge wound care:       Comments: Cleanse left thigh wound with vashe using a Q-tip applicator apply silver Hydrofiber to wound bed daily and secure with silicone foam or ABD pad and tape whichever is preferred   09/17/24 0848            Follow-up Information     Brien Belvie BRAVO, MD Follow up.   Specialty: Pulmonary Disease Contact information: [address] [employer]  [place] [address] [phone]                Allergies  Allergen Reactions   Crestor [Rosuvastatin] Other (See Comments)    Muscle aches and Allergic, per facility    Fish Allergy Other (See Comments)    Allergic, per facility   Shellfish Allergy Other (See Comments)    Possibly allergic. Paperwork from the facility cited fish as a KNOWN ALLERGY.    Consultations: none   Procedures/Studies: DG Chest Port 1 View Result Date: 09/10/2024 EXAM: 1 VIEW(S) XRAY OF THE CHEST 09/10/2024 02:25:17 PM COMPARISON: 09/17/2023 CLINICAL HISTORY: [id number] SOB (shortness of breath) [id number]. Per chart - Pt BIB EMS from [facility]. Pt tested + for covid yesterday and is having weakness and shob today. Pt is normally ambulatory but has not been today per facility. FINDINGS: LUNGS AND PLEURA: Mild left basilar atelectasis or infiltrate is noted. Possible small left pleural effusion. No pneumothorax. HEART AND MEDIASTINUM: No acute abnormality of the cardiac and mediastinal silhouettes. BONES AND SOFT TISSUES: No acute osseous abnormality. Severe degenerative changes seen involving the left humeral joint. IMPRESSION: 1.  Mild left basilar atelectasis or infiltrate with possible small left pleural effusion. Electronically signed by: Lynwood Seip MD 09/10/2024 02:41 PM EDT RP Workstation: [id number]   (Echo, Carotid, EGD, Colonoscopy, ERCP)    Subjective: No new c/o  Discharge Exam: Vitals:   09/16/24 2023 09/17/24 0442  BP: 133/65 124/63  Pulse: 75 66  Resp: 20 18  Temp: (!) 97.5 F (36.4 C) (!) 97.5 F (36.4 C)  SpO2: 98% 96%   Vitals:   09/16/24 0516 09/16/24 1359 09/16/24 2023 09/17/24 0442  BP: (!) 154/67 122/64 133/65 124/63  Pulse: 69 77 75 66  Resp: 19  20 18   Temp: 98 F (36.7 C) 97.8 F (36.6 C) (!) 97.5 F (36.4 C) (!) 97.5 F (36.4 C)  TempSrc: Oral Oral Oral Oral  SpO2: 94% 96% 98% 96%  Weight:      Height:        General: Pt is alert, awake, not in acute distress Cardiovascular: RRR, S1/S2 +, no rubs, no gallops Respiratory: CTA bilaterally, no wheezing, no rhonchi Abdominal: Soft, NT, ND, bowel sounds + Extremities: no edema, no cyanosis    The results of significant diagnostics from this hospitalization (including imaging, microbiology, ancillary and laboratory) are listed below for reference.     Microbiology: Recent Results (from the past 240 hours)  Blood culture (routine x 2)     Status: Abnormal   Collection Time: 09/10/24  2:32 PM   Specimen: BLOOD LEFT ARM  Result Value Ref Range Status   Specimen Description   Final    BLOOD LEFT ARM Performed at [facility], [address] [place], [place] [address]    Special Requests   Final    BOTTLES DRAWN AEROBIC AND ANAEROBIC Blood Culture results may not be optimal due to an inadequate volume of blood received in culture bottles Performed at [facility], [address] [place], [place] [address]    Culture  Setup Time   Final    GRAM POSITIVE COCCI IN CLUSTERS IN BOTH AEROBIC AND ANAEROBIC BOTTLES CRITICAL RESULT CALLED TO, READ BACK BY AND VERIFIED WITH: C. SHADE PHARMD, AT 1447  09/11/24 D. VANHOOK Performed at [facility], [address], [place], [place] [address]    Culture (A)  Final    STAPHYLOCOCCUS CAPITIS THE SIGNIFICANCE OF ISOLATING THIS ORGANISM  FROM A SINGLE SET OF BLOOD CULTURES WHEN MULTIPLE SETS ARE DRAWN IS UNCERTAIN. PLEASE NOTIFY THE MICROBIOLOGY DEPARTMENT WITHIN ONE WEEK IF SPECIATION AND SENSITIVITIES ARE REQUIRED. STAPHYLOCOCCUS HOMINIS    Report Status 09/16/2024 FINAL  Final   Organism ID, Bacteria STAPHYLOCOCCUS HOMINIS  Final      Susceptibility   Staphylococcus hominis - MIC*    CIPROFLOXACIN <=0.5 SENSITIVE Sensitive     ERYTHROMYCIN >=8 RESISTANT Resistant     GENTAMICIN  <=0.5 SENSITIVE Sensitive     OXACILLIN 1 RESISTANT Resistant     TETRACYCLINE <=1 SENSITIVE Sensitive     VANCOMYCIN  <=0.5 SENSITIVE Sensitive     TRIMETH/SULFA 40 SENSITIVE Sensitive     CLINDAMYCIN <=0.25 SENSITIVE Sensitive     RIFAMPIN <=0.5 SENSITIVE Sensitive     Inducible Clindamycin NEGATIVE Sensitive     * STAPHYLOCOCCUS HOMINIS  Blood Culture ID Panel (Reflexed)     Status: Abnormal   Collection Time: 09/10/24  2:32 PM  Result Value Ref Range Status   Enterococcus faecalis NOT DETECTED NOT DETECTED Final   Enterococcus Faecium NOT DETECTED NOT DETECTED Final   Listeria monocytogenes NOT DETECTED NOT DETECTED Final   Staphylococcus species DETECTED (A) NOT DETECTED Final    Comment: CRITICAL RESULT CALLED TO, READ BACK BY AND VERIFIED WITH: C. SHADE PHARMD, AT 1447 09/11/24 D. VANHOOK    Staphylococcus aureus (BCID) NOT DETECTED NOT DETECTED Final   Staphylococcus epidermidis NOT DETECTED NOT DETECTED Final   Staphylococcus lugdunensis NOT DETECTED NOT DETECTED Final   Streptococcus species NOT DETECTED NOT DETECTED Final   Streptococcus agalactiae NOT DETECTED NOT DETECTED Final   Streptococcus pneumoniae NOT DETECTED NOT DETECTED Final   Streptococcus pyogenes NOT DETECTED NOT DETECTED Final   A.calcoaceticus-baumannii NOT DETECTED NOT  DETECTED Final   Bacteroides fragilis NOT DETECTED NOT DETECTED Final   Enterobacterales NOT DETECTED NOT DETECTED Final   Enterobacter cloacae complex NOT DETECTED NOT DETECTED Final   Escherichia coli NOT DETECTED NOT DETECTED Final   Klebsiella aerogenes NOT DETECTED NOT DETECTED Final   Klebsiella oxytoca NOT DETECTED NOT DETECTED Final   Klebsiella pneumoniae NOT DETECTED NOT DETECTED Final   Proteus species NOT DETECTED NOT DETECTED Final   Salmonella species NOT DETECTED NOT DETECTED Final   Serratia marcescens NOT DETECTED NOT DETECTED Final   Haemophilus influenzae NOT DETECTED NOT DETECTED Final   Neisseria meningitidis NOT DETECTED NOT DETECTED Final   Pseudomonas aeruginosa NOT DETECTED NOT DETECTED Final   Stenotrophomonas maltophilia NOT DETECTED NOT DETECTED Final   Candida albicans NOT DETECTED NOT DETECTED Final   Candida auris NOT DETECTED NOT DETECTED Final   Candida glabrata NOT DETECTED NOT DETECTED Final   Candida krusei NOT DETECTED NOT DETECTED Final   Candida parapsilosis NOT DETECTED NOT DETECTED Final   Candida tropicalis NOT DETECTED NOT DETECTED Final   Cryptococcus neoformans/gattii NOT DETECTED NOT DETECTED Final    Comment: Performed at [facility], [address] [place], [place] [address]  Resp panel by RT-PCR (RSV, Flu A&B, Covid) Anterior Nasal Swab     Status: Abnormal   Collection Time: 09/10/24  2:49 PM   Specimen: Anterior Nasal Swab  Result Value Ref Range Status   SARS Coronavirus 2 by RT PCR POSITIVE (A) NEGATIVE Final    Comment: (NOTE) SARS-CoV-2 target nucleic acids are DETECTED.  The SARS-CoV-2 RNA is generally detectable in upper respiratory specimens during the acute phase of infection. Positive results are indicative of the presence of the identified virus, but  do not rule out bacterial infection or co-infection with other pathogens not detected by the test. Clinical correlation with patient history and other diagnostic  information is necessary to determine patient infection status. The expected result is Negative.  Fact Sheet for Patients: bloggercourse.com  Fact Sheet for Healthcare Providers: seriousbroker.it  This test is not yet approved or cleared by the [place]  FDA and  has been authorized for detection and/or diagnosis of SARS-CoV-2 by FDA under an Emergency Use Authorization (EUA).  This EUA will remain in effect (meaning this test can be used) for the duration of  the COVID-19 declaration under Section 564(b)(1) of the A ct, 21 U.S.C. section 360bbb-3(b)(1), unless the authorization is terminated or revoked sooner.     Influenza A by PCR NEGATIVE NEGATIVE Final   Influenza B by PCR NEGATIVE NEGATIVE Final    Comment: (NOTE) The Xpert Xpress SARS-CoV-2/FLU/RSV plus assay is intended as an aid in the diagnosis of influenza from Nasopharyngeal swab specimens and should not be used as a sole basis for treatment. Nasal washings and aspirates are unacceptable for Xpert Xpress SARS-CoV-2/FLU/RSV testing.  Fact Sheet for Patients: bloggercourse.com  Fact Sheet for Healthcare Providers: seriousbroker.it  This test is not yet approved or cleared by the [place]  FDA and has been authorized for detection and/or diagnosis of SARS-CoV-2 by FDA under an Emergency Use Authorization (EUA). This EUA will remain in effect (meaning this test can be used) for the duration of the COVID-19 declaration under Section 564(b)(1) of the Act, 21 U.S.C. section 360bbb-3(b)(1), unless the authorization is terminated or revoked.     Resp Syncytial Virus by PCR NEGATIVE NEGATIVE Final    Comment: (NOTE) Fact Sheet for Patients: bloggercourse.com  Fact Sheet for Healthcare Providers: seriousbroker.it  This test is not yet approved or cleared by the  [place]  FDA and has been authorized for detection and/or diagnosis of SARS-CoV-2 by FDA under an Emergency Use Authorization (EUA). This EUA will remain in effect (meaning this test can be used) for the duration of the COVID-19 declaration under Section 564(b)(1) of the Act, 21 U.S.C. section 360bbb-3(b)(1), unless the authorization is terminated or revoked.  Performed at [facility], [address] [place], [place] [address]   Blood culture (routine x 2)     Status: None (Preliminary result)   Collection Time: 09/10/24  6:08 PM   Specimen: BLOOD RIGHT ARM  Result Value Ref Range Status   Specimen Description   Final    BLOOD RIGHT ARM Performed at [facility], [address] [place], [place] [address]    Special Requests   Final    BOTTLES DRAWN AEROBIC AND ANAEROBIC Blood Culture adequate volume Performed at [facility], [address], [place], [place] [address]    Culture  Setup Time   Final    GRAM POSITIVE RODS AEROBIC BOTTLE ONLY CRITICAL RESULT CALLED TO, READ BACK BY AND VERIFIED WITH: PHARMD NIC GLOGOVAC [id number] AT 2043 BY EC    Culture   Final    GRAM POSITIVE RODS CORRECTED ON 11/02 AT 2041: PREVIOUSLY REPORTED AS NO GROWTH 5 DAYS CULTURE REINCUBATED FOR BETTER GROWTH Performed at [facility], [address] [place], [place] [address]    Report Status PENDING  Incomplete  MRSA Next Gen by PCR, Nasal     Status: None   Collection Time: 09/11/24  2:00 AM   Specimen: Nasal Mucosa; Nasal Swab  Result Value Ref Range Status  MRSA by PCR Next Gen NOT DETECTED NOT DETECTED Final    Comment: (NOTE) The GeneXpert MRSA Assay (FDA approved for NASAL specimens only), is one component of a comprehensive MRSA colonization surveillance program. It is not intended to diagnose MRSA infection nor to guide or monitor treatment for MRSA infections. Test performance is not FDA approved in patients less than 52  years old. Performed at [facility], [address], [place], [place] [address]   Culture, blood (Routine X 2) w Reflex to ID Panel     Status: Abnormal   Collection Time: 09/11/24  6:46 PM   Specimen: BLOOD LEFT HAND  Result Value Ref Range Status   Specimen Description   Final    BLOOD LEFT HAND Performed at [facility], [address], [place], [place] [address]    Special Requests   Final    BOTTLES DRAWN AEROBIC ONLY Blood Culture results may not be optimal due to an inadequate volume of blood received in culture bottles Performed at [facility], [address] [place], [place] [address]    Culture  Setup Time   Final    GRAM POSITIVE COCCI IN CLUSTERS BOTTLES DRAWN AEROBIC ONLY CRITICAL VALUE NOTED.  VALUE IS CONSISTENT WITH PREVIOUSLY REPORTED AND CALLED VALUE. Performed at [facility], [address], [place], [place] [address]    Culture STAPHYLOCOCCUS HOMINIS (A)  Final   Report Status 09/17/2024 FINAL  Final   Organism ID, Bacteria STAPHYLOCOCCUS HOMINIS  Final      Susceptibility   Staphylococcus hominis - MIC*    CIPROFLOXACIN <=0.5 SENSITIVE Sensitive     ERYTHROMYCIN >=8 RESISTANT Resistant     GENTAMICIN  <=0.5 SENSITIVE Sensitive     OXACILLIN >=4 RESISTANT Resistant     TETRACYCLINE >=16 RESISTANT Resistant     VANCOMYCIN  <=0.5 SENSITIVE Sensitive     TRIMETH/SULFA 40 SENSITIVE Sensitive     CLINDAMYCIN <=0.25 SENSITIVE Sensitive     RIFAMPIN <=0.5 SENSITIVE Sensitive     Inducible Clindamycin NEGATIVE Sensitive     * STAPHYLOCOCCUS HOMINIS  Culture, blood (Routine X 2) w Reflex to ID Panel     Status: None   Collection Time: 09/12/24 12:42 AM   Specimen: BLOOD  Result Value Ref Range Status   Specimen Description   Final    BLOOD RIGHT ANTECUBITAL Performed at [facility], [address] [place], [place] [address]    Special Requests   Final    Blood Culture adequate volume  BOTTLES DRAWN AEROBIC ONLY Performed at [facility], [address], [place], [place] [address]    Culture   Final    NO GROWTH 5 DAYS Performed at [facility], [address], [place], [place] [address]    Report Status 09/17/2024 FINAL  Final     Labs: BNP (last 3 results) No results for input(s): BNP in the last 8760 hours. Basic Metabolic Panel: Recent Labs  Lab 09/10/24 1618 09/11/24 0415 09/12/24 1056 09/13/24 0415 09/14/24 1128  NA 139 140 140 146* 140  K 4.3 4.1 4.1 3.9 4.1  CL 104 106 106 109 103  CO2 20* 24 21* 24 25  GLUCOSE 262* 248* 288* 145* 271*  BUN 34* 39* 37* 37* 29*  CREATININE 1.97* 1.46* 0.99 0.90 0.84  CALCIUM  8.7* 8.6* 8.4* 9.0 9.5   Liver Function Tests: Recent Labs  Lab 09/10/24 1618 09/11/24 0415 09/12/24 1056 09/13/24 0415 09/14/24  1128  AST 80* 70* 96* 61* 34  ALT 33 28 33 30 29  ALKPHOS 44 48 39 48 39  BILITOT 0.3 0.3 <0.2 0.2 0.2  PROT 7.0 6.8 6.4* 6.2* 6.3*  ALBUMIN 3.8 3.6 3.3* 3.4* 3.6   No results for input(s): LIPASE, AMYLASE in the last 168 hours. No results for input(s): AMMONIA in the last 168 hours. CBC: Recent Labs  Lab 09/10/24 1414 09/11/24 0415 09/12/24 1056 09/13/24 0415 09/14/24 1128  WBC 9.1 11.7* 9.4 8.0 8.6  NEUTROABS  --   --   --  6.1  --   HGB 11.4* 10.6* 9.8* 10.2* 10.4*  HCT 35.9* 33.7* 31.8* 33.0* 33.8*  MCV 94.0 95.2 96.1 95.7 94.2  PLT 226 232 238 271 269   Cardiac Enzymes: No results for input(s): CKTOTAL, CKMB, CKMBINDEX, TROPONINI in the last 168 hours. BNP: Invalid input(s): POCBNP CBG: Recent Labs  Lab 09/16/24 1739 09/16/24 2019 09/17/24 0011 09/17/24 0434 09/17/24 0819  GLUCAP 300* 290* 171* 116* 122*   D-Dimer No results for input(s): DDIMER in the last 72 hours. Hgb A1c No results for input(s): HGBA1C in the last 72 hours. Lipid Profile No results for input(s): CHOL, HDL, LDLCALC, TRIG, CHOLHDL, LDLDIRECT in the  last 72 hours. Thyroid  function studies No results for input(s): TSH, T4TOTAL, T3FREE, THYROIDAB in the last 72 hours.  Invalid input(s): FREET3 Anemia work up No results for input(s): VITAMINB12, FOLATE, FERRITIN, TIBC, IRON , RETICCTPCT in the last 72 hours. Urinalysis    Component Value Date/Time   COLORURINE YELLOW 08/09/2023 2042   APPEARANCEUR CLEAR 08/09/2023 2042   LABSPEC 1.018 08/09/2023 2042   PHURINE 5.0 08/09/2023 2042   GLUCOSEU NEGATIVE 08/09/2023 2042   HGBUR LARGE (A) 08/09/2023 2042   BILIRUBINUR NEGATIVE 08/09/2023 2042   KETONESUR 5 (A) 08/09/2023 2042   PROTEINUR 30 (A) 08/09/2023 2042   UROBILINOGEN 1.0 09/03/2009 1956   NITRITE NEGATIVE 08/09/2023 2042   LEUKOCYTESUR NEGATIVE 08/09/2023 2042   Sepsis Labs Recent Labs  Lab 09/11/24 0415 09/12/24 1056 09/13/24 0415 09/14/24 1128  WBC 11.7* 9.4 8.0 8.6   Microbiology Recent Results (from the past 240 hours)  Blood culture (routine x 2)     Status: Abnormal   Collection Time: 09/10/24  2:32 PM   Specimen: BLOOD LEFT ARM  Result Value Ref Range Status   Specimen Description   Final    BLOOD LEFT ARM Performed at [facility], [address] [place], [place] [address]    Special Requests   Final    BOTTLES DRAWN AEROBIC AND ANAEROBIC Blood Culture results may not be optimal due to an inadequate volume of blood received in culture bottles Performed at [facility], [address] [place], [place] [address]    Culture  Setup Time   Final    GRAM POSITIVE COCCI IN CLUSTERS IN BOTH AEROBIC AND ANAEROBIC BOTTLES CRITICAL RESULT CALLED TO, READ BACK BY AND VERIFIED WITH: C. SHADE PHARMD, AT 1447 09/11/24 D. VANHOOK Performed at [facility], [address] [place], [place] [address]    Culture (A)  Final    STAPHYLOCOCCUS CAPITIS THE SIGNIFICANCE OF ISOLATING THIS ORGANISM FROM A SINGLE SET OF BLOOD CULTURES WHEN MULTIPLE SETS ARE DRAWN IS  UNCERTAIN. PLEASE NOTIFY THE MICROBIOLOGY DEPARTMENT WITHIN ONE WEEK IF SPECIATION AND SENSITIVITIES ARE REQUIRED. STAPHYLOCOCCUS HOMINIS    Report Status 09/16/2024 FINAL  Final   Organism ID, Bacteria STAPHYLOCOCCUS HOMINIS  Final  Susceptibility   Staphylococcus hominis - MIC*    CIPROFLOXACIN <=0.5 SENSITIVE Sensitive     ERYTHROMYCIN >=8 RESISTANT Resistant     GENTAMICIN  <=0.5 SENSITIVE Sensitive     OXACILLIN 1 RESISTANT Resistant     TETRACYCLINE <=1 SENSITIVE Sensitive     VANCOMYCIN  <=0.5 SENSITIVE Sensitive     TRIMETH/SULFA 40 SENSITIVE Sensitive     CLINDAMYCIN <=0.25 SENSITIVE Sensitive     RIFAMPIN <=0.5 SENSITIVE Sensitive     Inducible Clindamycin NEGATIVE Sensitive     * STAPHYLOCOCCUS HOMINIS  Blood Culture ID Panel (Reflexed)     Status: Abnormal   Collection Time: 09/10/24  2:32 PM  Result Value Ref Range Status   Enterococcus faecalis NOT DETECTED NOT DETECTED Final   Enterococcus Faecium NOT DETECTED NOT DETECTED Final   Listeria monocytogenes NOT DETECTED NOT DETECTED Final   Staphylococcus species DETECTED (A) NOT DETECTED Final    Comment: CRITICAL RESULT CALLED TO, READ BACK BY AND VERIFIED WITH: C. SHADE PHARMD, AT 1447 09/11/24 D. VANHOOK    Staphylococcus aureus (BCID) NOT DETECTED NOT DETECTED Final   Staphylococcus epidermidis NOT DETECTED NOT DETECTED Final   Staphylococcus lugdunensis NOT DETECTED NOT DETECTED Final   Streptococcus species NOT DETECTED NOT DETECTED Final   Streptococcus agalactiae NOT DETECTED NOT DETECTED Final   Streptococcus pneumoniae NOT DETECTED NOT DETECTED Final   Streptococcus pyogenes NOT DETECTED NOT DETECTED Final   A.calcoaceticus-baumannii NOT DETECTED NOT DETECTED Final   Bacteroides fragilis NOT DETECTED NOT DETECTED Final   Enterobacterales NOT DETECTED NOT DETECTED Final   Enterobacter cloacae complex NOT DETECTED NOT DETECTED Final   Escherichia coli NOT DETECTED NOT DETECTED Final   Klebsiella aerogenes  NOT DETECTED NOT DETECTED Final   Klebsiella oxytoca NOT DETECTED NOT DETECTED Final   Klebsiella pneumoniae NOT DETECTED NOT DETECTED Final   Proteus species NOT DETECTED NOT DETECTED Final   Salmonella species NOT DETECTED NOT DETECTED Final   Serratia marcescens NOT DETECTED NOT DETECTED Final   Haemophilus influenzae NOT DETECTED NOT DETECTED Final   Neisseria meningitidis NOT DETECTED NOT DETECTED Final   Pseudomonas aeruginosa NOT DETECTED NOT DETECTED Final   Stenotrophomonas maltophilia NOT DETECTED NOT DETECTED Final   Candida albicans NOT DETECTED NOT DETECTED Final   Candida auris NOT DETECTED NOT DETECTED Final   Candida glabrata NOT DETECTED NOT DETECTED Final   Candida krusei NOT DETECTED NOT DETECTED Final   Candida parapsilosis NOT DETECTED NOT DETECTED Final   Candida tropicalis NOT DETECTED NOT DETECTED Final   Cryptococcus neoformans/gattii NOT DETECTED NOT DETECTED Final    Comment: Performed at [facility], [address], [place], [place] [address]  Resp panel by RT-PCR (RSV, Flu A&B, Covid) Anterior Nasal Swab     Status: Abnormal   Collection Time: 09/10/24  2:49 PM   Specimen: Anterior Nasal Swab  Result Value Ref Range Status   SARS Coronavirus 2 by RT PCR POSITIVE (A) NEGATIVE Final    Comment: (NOTE) SARS-CoV-2 target nucleic acids are DETECTED.  The SARS-CoV-2 RNA is generally detectable in upper respiratory specimens during the acute phase of infection. Positive results are indicative of the presence of the identified virus, but do not rule out bacterial infection or co-infection with other pathogens not detected by the test. Clinical correlation with patient history and other diagnostic information is necessary to determine patient infection status. The expected result is Negative.  Fact Sheet for Patients: bloggercourse.com  Fact Sheet for Healthcare Providers: seriousbroker.it  This test  is not yet approved or cleared by the [place]  FDA and  has been authorized for detection and/or diagnosis of SARS-CoV-2 by FDA under an Emergency Use Authorization (EUA).  This EUA will remain in effect (meaning this test can be used) for the duration of  the COVID-19 declaration under Section 564(b)(1) of the A ct, 21 U.S.C. section 360bbb-3(b)(1), unless the authorization is terminated or revoked sooner.     Influenza A by PCR NEGATIVE NEGATIVE Final   Influenza B by PCR NEGATIVE NEGATIVE Final    Comment: (NOTE) The Xpert Xpress SARS-CoV-2/FLU/RSV plus assay is intended as an aid in the diagnosis of influenza from Nasopharyngeal swab specimens and should not be used as a sole basis for treatment. Nasal washings and aspirates are unacceptable for Xpert Xpress SARS-CoV-2/FLU/RSV testing.  Fact Sheet for Patients: bloggercourse.com  Fact Sheet for Healthcare Providers: seriousbroker.it  This test is not yet approved or cleared by the [place]  FDA and has been authorized for detection and/or diagnosis of SARS-CoV-2 by FDA under an Emergency Use Authorization (EUA). This EUA will remain in effect (meaning this test can be used) for the duration of the COVID-19 declaration under Section 564(b)(1) of the Act, 21 U.S.C. section 360bbb-3(b)(1), unless the authorization is terminated or revoked.     Resp Syncytial Virus by PCR NEGATIVE NEGATIVE Final    Comment: (NOTE) Fact Sheet for Patients: bloggercourse.com  Fact Sheet for Healthcare Providers: seriousbroker.it  This test is not yet approved or cleared by the [place]  FDA and has been authorized for detection and/or diagnosis of SARS-CoV-2 by FDA under an Emergency Use Authorization (EUA). This EUA will remain in effect (meaning this test can be used) for the duration of the COVID-19 declaration under Section  564(b)(1) of the Act, 21 U.S.C. section 360bbb-3(b)(1), unless the authorization is terminated or revoked.  Performed at [facility], [address], [place], [place] [address]   Blood culture (routine x 2)     Status: None (Preliminary result)   Collection Time: 09/10/24  6:08 PM   Specimen: BLOOD RIGHT ARM  Result Value Ref Range Status   Specimen Description   Final    BLOOD RIGHT ARM Performed at [facility], [address], [place], [place] [address]    Special Requests   Final    BOTTLES DRAWN AEROBIC AND ANAEROBIC Blood Culture adequate volume Performed at [facility], [address] [place], [place] [address]    Culture  Setup Time   Final    GRAM POSITIVE RODS AEROBIC BOTTLE ONLY CRITICAL RESULT CALLED TO, READ BACK BY AND VERIFIED WITH: PHARMD NIC GLOGOVAC [id number] AT 2043 BY EC    Culture   Final    GRAM POSITIVE RODS CORRECTED ON 11/02 AT 2041: PREVIOUSLY REPORTED AS NO GROWTH 5 DAYS CULTURE REINCUBATED FOR BETTER GROWTH Performed at [facility], [address], [place], [place] [address]    Report Status PENDING  Incomplete  MRSA Next Gen by PCR, Nasal     Status: None   Collection Time: 09/11/24  2:00 AM   Specimen: Nasal Mucosa; Nasal Swab  Result Value Ref Range Status   MRSA by PCR Next Gen NOT DETECTED NOT DETECTED Final    Comment: (NOTE) The GeneXpert MRSA Assay (FDA approved for NASAL specimens only), is one component of a comprehensive MRSA colonization surveillance program. It is not intended to diagnose MRSA infection nor to guide or monitor treatment for MRSA infections.  Test performance is not FDA approved in patients less than 57 years old. Performed at [facility], [address], [place], [place] [address]   Culture, blood (Routine X 2) w Reflex to ID Panel     Status: Abnormal   Collection Time: 09/11/24  6:46 PM   Specimen: BLOOD LEFT HAND  Result Value Ref Range  Status   Specimen Description   Final    BLOOD LEFT HAND Performed at [facility], [address], [place], [place] [address]    Special Requests   Final    BOTTLES DRAWN AEROBIC ONLY Blood Culture results may not be optimal due to an inadequate volume of blood received in culture bottles Performed at [facility], [address], [place], [place] [address]    Culture  Setup Time   Final    GRAM POSITIVE COCCI IN CLUSTERS BOTTLES DRAWN AEROBIC ONLY CRITICAL VALUE NOTED.  VALUE IS CONSISTENT WITH PREVIOUSLY REPORTED AND CALLED VALUE. Performed at [facility], [address], [place], [place] [address]    Culture STAPHYLOCOCCUS HOMINIS (A)  Final   Report Status 09/17/2024 FINAL  Final   Organism ID, Bacteria STAPHYLOCOCCUS HOMINIS  Final      Susceptibility   Staphylococcus hominis - MIC*    CIPROFLOXACIN <=0.5 SENSITIVE Sensitive     ERYTHROMYCIN >=8 RESISTANT Resistant     GENTAMICIN  <=0.5 SENSITIVE Sensitive     OXACILLIN >=4 RESISTANT Resistant     TETRACYCLINE >=16 RESISTANT Resistant     VANCOMYCIN  <=0.5 SENSITIVE Sensitive     TRIMETH/SULFA 40 SENSITIVE Sensitive     CLINDAMYCIN <=0.25 SENSITIVE Sensitive     RIFAMPIN <=0.5 SENSITIVE Sensitive     Inducible Clindamycin NEGATIVE Sensitive     * STAPHYLOCOCCUS HOMINIS  Culture, blood (Routine X 2) w Reflex to ID Panel     Status: None   Collection Time: 09/12/24 12:42 AM   Specimen: BLOOD  Result Value Ref Range Status   Specimen Description   Final    BLOOD RIGHT ANTECUBITAL Performed at [facility], [address] [place], [place] [address]    Special Requests   Final    Blood Culture adequate volume BOTTLES DRAWN AEROBIC ONLY Performed at [facility], [address] [place], [place] [address]    Culture   Final    NO GROWTH 5 DAYS Performed at [facility], [address], [place], [place] [address]    Report Status 09/17/2024 FINAL   Final     Time coordinating discharge: 39 min SIGNED:   Almarie KANDICE Hoots, MD  [facility] 09/17/2024, 8:56 AM

## 2024-09-17 NOTE — Plan of Care (Signed)

## 2024-09-18 LAB — CULTURE, BLOOD (ROUTINE X 2)
Culture  Setup Time: NO GROWTH
Special Requests: ADEQUATE
